# Patient Record
Sex: Male | Born: 1950
Health system: Southern US, Community
[De-identification: ages and names within clinical notes are randomized; demographics above are authoritative.]

## PROBLEM LIST (undated history)

## (undated) DIAGNOSIS — K4090 Unilateral inguinal hernia, without obstruction or gangrene, not specified as recurrent: Secondary | ICD-10-CM

## (undated) DIAGNOSIS — H269 Unspecified cataract: Secondary | ICD-10-CM

## (undated) DIAGNOSIS — I10 Essential (primary) hypertension: Secondary | ICD-10-CM

## (undated) DIAGNOSIS — H35039 Hypertensive retinopathy, unspecified eye: Secondary | ICD-10-CM

## (undated) DIAGNOSIS — I2510 Atherosclerotic heart disease of native coronary artery without angina pectoris: Secondary | ICD-10-CM

## (undated) DIAGNOSIS — E11319 Type 2 diabetes mellitus with unspecified diabetic retinopathy without macular edema: Secondary | ICD-10-CM

## (undated) DIAGNOSIS — E119 Type 2 diabetes mellitus without complications: Secondary | ICD-10-CM

## (undated) DIAGNOSIS — E1165 Type 2 diabetes mellitus with hyperglycemia: Secondary | ICD-10-CM

## (undated) HISTORY — DX: Unspecified cataract: H26.9

## (undated) HISTORY — DX: Type 2 diabetes mellitus with unspecified diabetic retinopathy without macular edema: E11.319

## (undated) HISTORY — DX: Hypertensive retinopathy, unspecified eye: H35.039

## (undated) HISTORY — PX: WISDOM TOOTH EXTRACTION: SHX21

## (undated) HISTORY — PX: HEMORROIDECTOMY: SUR656

---

## 2005-10-14 ENCOUNTER — Emergency Department (HOSPITAL_COMMUNITY): Admission: EM | Admit: 2005-10-14 | Discharge: 2005-10-14 | Payer: Self-pay | Admitting: Emergency Medicine

## 2018-11-11 ENCOUNTER — Emergency Department (HOSPITAL_BASED_OUTPATIENT_CLINIC_OR_DEPARTMENT_OTHER): Payer: Medicare Other

## 2018-11-11 ENCOUNTER — Encounter (HOSPITAL_BASED_OUTPATIENT_CLINIC_OR_DEPARTMENT_OTHER): Payer: Self-pay | Admitting: Emergency Medicine

## 2018-11-11 ENCOUNTER — Other Ambulatory Visit: Payer: Self-pay

## 2018-11-11 ENCOUNTER — Inpatient Hospital Stay (HOSPITAL_BASED_OUTPATIENT_CLINIC_OR_DEPARTMENT_OTHER)
Admission: EM | Admit: 2018-11-11 | Discharge: 2018-11-17 | DRG: 247 | Disposition: A | Discharge status: Home / Self Care | Payer: Medicare Other | Attending: Internal Medicine | Admitting: Internal Medicine

## 2018-11-11 DIAGNOSIS — E1169 Type 2 diabetes mellitus with other specified complication: Secondary | ICD-10-CM

## 2018-11-11 DIAGNOSIS — I509 Heart failure, unspecified: Secondary | ICD-10-CM | POA: Diagnosis not present

## 2018-11-11 DIAGNOSIS — I447 Left bundle-branch block, unspecified: Secondary | ICD-10-CM | POA: Diagnosis present

## 2018-11-11 DIAGNOSIS — I472 Ventricular tachycardia: Secondary | ICD-10-CM | POA: Diagnosis not present

## 2018-11-11 DIAGNOSIS — I10 Essential (primary) hypertension: Secondary | ICD-10-CM | POA: Diagnosis not present

## 2018-11-11 DIAGNOSIS — I160 Hypertensive urgency: Secondary | ICD-10-CM | POA: Diagnosis present

## 2018-11-11 DIAGNOSIS — I2510 Atherosclerotic heart disease of native coronary artery without angina pectoris: Secondary | ICD-10-CM | POA: Diagnosis present

## 2018-11-11 DIAGNOSIS — R945 Abnormal results of liver function studies: Secondary | ICD-10-CM

## 2018-11-11 DIAGNOSIS — I110 Hypertensive heart disease with heart failure: Principal | ICD-10-CM | POA: Diagnosis present

## 2018-11-11 DIAGNOSIS — Z833 Family history of diabetes mellitus: Secondary | ICD-10-CM | POA: Diagnosis not present

## 2018-11-11 DIAGNOSIS — E876 Hypokalemia: Secondary | ICD-10-CM | POA: Diagnosis present

## 2018-11-11 DIAGNOSIS — Z955 Presence of coronary angioplasty implant and graft: Secondary | ICD-10-CM | POA: Diagnosis not present

## 2018-11-11 DIAGNOSIS — K219 Gastro-esophageal reflux disease without esophagitis: Secondary | ICD-10-CM | POA: Diagnosis present

## 2018-11-11 DIAGNOSIS — R7989 Other specified abnormal findings of blood chemistry: Secondary | ICD-10-CM | POA: Diagnosis present

## 2018-11-11 DIAGNOSIS — IMO0002 Reserved for concepts with insufficient information to code with codable children: Secondary | ICD-10-CM

## 2018-11-11 DIAGNOSIS — I5041 Acute combined systolic (congestive) and diastolic (congestive) heart failure: Secondary | ICD-10-CM | POA: Diagnosis present

## 2018-11-11 DIAGNOSIS — E1165 Type 2 diabetes mellitus with hyperglycemia: Secondary | ICD-10-CM | POA: Diagnosis present

## 2018-11-11 DIAGNOSIS — E1122 Type 2 diabetes mellitus with diabetic chronic kidney disease: Secondary | ICD-10-CM | POA: Diagnosis present

## 2018-11-11 DIAGNOSIS — K4090 Unilateral inguinal hernia, without obstruction or gangrene, not specified as recurrent: Secondary | ICD-10-CM | POA: Diagnosis present

## 2018-11-11 DIAGNOSIS — I1A0 Resistant hypertension: Secondary | ICD-10-CM | POA: Diagnosis present

## 2018-11-11 DIAGNOSIS — K920 Hematemesis: Secondary | ICD-10-CM | POA: Diagnosis present

## 2018-11-11 DIAGNOSIS — R17 Unspecified jaundice: Secondary | ICD-10-CM | POA: Diagnosis present

## 2018-11-11 DIAGNOSIS — Z79899 Other long term (current) drug therapy: Secondary | ICD-10-CM

## 2018-11-11 DIAGNOSIS — I5042 Chronic combined systolic (congestive) and diastolic (congestive) heart failure: Secondary | ICD-10-CM | POA: Insufficient documentation

## 2018-11-11 DIAGNOSIS — Z8249 Family history of ischemic heart disease and other diseases of the circulatory system: Secondary | ICD-10-CM

## 2018-11-11 DIAGNOSIS — E7800 Pure hypercholesterolemia, unspecified: Secondary | ICD-10-CM | POA: Diagnosis not present

## 2018-11-11 HISTORY — DX: Type 2 diabetes mellitus with hyperglycemia: E11.65

## 2018-11-11 HISTORY — DX: Atherosclerotic heart disease of native coronary artery without angina pectoris: I25.10

## 2018-11-11 HISTORY — DX: Essential (primary) hypertension: I10

## 2018-11-11 HISTORY — DX: Unilateral inguinal hernia, without obstruction or gangrene, not specified as recurrent: K40.90

## 2018-11-11 HISTORY — DX: Resistant hypertension: I1A.0

## 2018-11-11 HISTORY — DX: Type 2 diabetes mellitus without complications: E11.9

## 2018-11-11 HISTORY — DX: Reserved for concepts with insufficient information to code with codable children: IMO0002

## 2018-11-11 LAB — CBC WITH DIFFERENTIAL/PLATELET
ABS IMMATURE GRANULOCYTES: 0.02 10*3/uL (ref 0.00–0.07)
BASOS PCT: 0 %
Basophils Absolute: 0 10*3/uL (ref 0.0–0.1)
Eosinophils Absolute: 0.1 10*3/uL (ref 0.0–0.5)
Eosinophils Relative: 1 %
HCT: 40 % (ref 39.0–52.0)
HEMOGLOBIN: 13.3 g/dL (ref 13.0–17.0)
IMMATURE GRANULOCYTES: 0 %
LYMPHS PCT: 19 %
Lymphs Abs: 1 10*3/uL (ref 0.7–4.0)
MCH: 30.6 pg (ref 26.0–34.0)
MCHC: 33.3 g/dL (ref 30.0–36.0)
MCV: 92 fL (ref 80.0–100.0)
MONO ABS: 0.4 10*3/uL (ref 0.1–1.0)
MONOS PCT: 7 %
NEUTROS ABS: 3.7 10*3/uL (ref 1.7–7.7)
NEUTROS PCT: 73 %
PLATELETS: 168 10*3/uL (ref 150–400)
RBC: 4.35 MIL/uL (ref 4.22–5.81)
RDW: 12.1 % (ref 11.5–15.5)
WBC: 5.1 10*3/uL (ref 4.0–10.5)
nRBC: 0 % (ref 0.0–0.2)

## 2018-11-11 LAB — COMPREHENSIVE METABOLIC PANEL
ALBUMIN: 3.1 g/dL — AB (ref 3.5–5.0)
ALK PHOS: 158 U/L — AB (ref 38–126)
ALT: 47 U/L — AB (ref 0–44)
AST: 28 U/L (ref 15–41)
Anion gap: 5 (ref 5–15)
BILIRUBIN TOTAL: 1.3 mg/dL — AB (ref 0.3–1.2)
BUN: 14 mg/dL (ref 8–23)
CALCIUM: 8.5 mg/dL — AB (ref 8.9–10.3)
CO2: 29 mmol/L (ref 22–32)
CREATININE: 0.7 mg/dL (ref 0.61–1.24)
Chloride: 101 mmol/L (ref 98–111)
GFR calc Af Amer: >60 mL/min (ref >60)
GLUCOSE: 423 mg/dL — AB (ref 70–99)
Potassium: 3.8 mmol/L (ref 3.5–5.1)
Sodium: 135 mmol/L (ref 135–145)
TOTAL PROTEIN: 7 g/dL (ref 6.5–8.1)

## 2018-11-11 LAB — GLUCOSE, CAPILLARY: Glucose-Capillary: 361 mg/dL — ABNORMAL HIGH (ref 70–99)

## 2018-11-11 LAB — URINALYSIS, MICROSCOPIC (REFLEX)

## 2018-11-11 LAB — URINALYSIS, ROUTINE W REFLEX MICROSCOPIC
BILIRUBIN URINE: NEGATIVE
Glucose, UA: >=500 mg/dL — AB
KETONES UR: NEGATIVE mg/dL
LEUKOCYTE UA: NEGATIVE
Nitrite: NEGATIVE
Protein, ur: 30 mg/dL — AB
SPECIFIC GRAVITY, URINE: 1.01 (ref 1.005–1.030)
pH: 7 (ref 5.0–8.0)

## 2018-11-11 LAB — TROPONIN I: Troponin I: <0.03 ng/mL (ref <0.03)

## 2018-11-11 LAB — LIPASE, BLOOD: LIPASE: 25 U/L (ref 11–51)

## 2018-11-11 LAB — MAGNESIUM: MAGNESIUM: 1.5 mg/dL — AB (ref 1.7–2.4)

## 2018-11-11 LAB — BRAIN NATRIURETIC PEPTIDE: B Natriuretic Peptide: 746.4 pg/mL — ABNORMAL HIGH (ref 0.0–100.0)

## 2018-11-11 LAB — PHOSPHORUS: PHOSPHORUS: 2.7 mg/dL (ref 2.5–4.6)

## 2018-11-11 LAB — PROTIME-INR
INR: 1.2 (ref 0.8–1.2)
PROTHROMBIN TIME: 14.7 s (ref 11.4–15.2)

## 2018-11-11 LAB — TSH: TSH: 2.755 u[IU]/mL (ref 0.350–4.500)

## 2018-11-11 LAB — LACTIC ACID, PLASMA: Lactic Acid, Venous: 1.3 mmol/L (ref 0.5–1.9)

## 2018-11-11 LAB — CBG MONITORING, ED: Glucose-Capillary: 379 mg/dL — ABNORMAL HIGH (ref 70–99)

## 2018-11-11 LAB — HEMOGLOBIN A1C
Hgb A1c MFr Bld: 10.9 % — ABNORMAL HIGH (ref 4.8–5.6)
Mean Plasma Glucose: 266.13 mg/dL

## 2018-11-11 MED ORDER — ACETAMINOPHEN 650 MG RE SUPP: 650.0000 mg | Freq: Four times a day (QID) | RECTAL | Status: DC | PRN

## 2018-11-11 MED ORDER — NITROGLYCERIN 2 % TD OINT
1.0000 [in_us] | TOPICAL_OINTMENT | Freq: Four times a day (QID) | TRANSDERMAL | Status: DC
  Administered 2018-11-11 – 2018-11-17 (×23): 1 [in_us] via TOPICAL
  Filled 2018-11-11: qty 30
  Filled 2018-11-11: qty 1
  Filled 2018-11-11: qty 30

## 2018-11-11 MED ORDER — MAGNESIUM SULFATE 4 GM/100ML IV SOLN
4.0000 g | Freq: Once | INTRAVENOUS | Status: AC
  Administered 2018-11-11: 4 g via INTRAVENOUS
  Filled 2018-11-11: qty 100

## 2018-11-11 MED ORDER — ONDANSETRON HCL 4 MG PO TABS: 4.0000 mg | ORAL_TABLET | Freq: Four times a day (QID) | ORAL | Status: DC | PRN

## 2018-11-11 MED ORDER — ONDANSETRON HCL 4 MG/2ML IJ SOLN: 4.0000 mg | Freq: Four times a day (QID) | INTRAMUSCULAR | Status: DC | PRN

## 2018-11-11 MED ORDER — FUROSEMIDE 10 MG/ML IJ SOLN
40.0000 mg | Freq: Once | INTRAMUSCULAR | Status: AC
  Administered 2018-11-11: 40 mg via INTRAVENOUS
  Filled 2018-11-11: qty 4

## 2018-11-11 MED ORDER — FUROSEMIDE 10 MG/ML IJ SOLN
40.0000 mg | Freq: Two times a day (BID) | INTRAMUSCULAR | Status: DC
  Administered 2018-11-12 – 2018-11-13 (×3): 40 mg via INTRAVENOUS
  Filled 2018-11-11 (×3): qty 4

## 2018-11-11 MED ORDER — ACETAMINOPHEN 325 MG PO TABS: 650.0000 mg | ORAL_TABLET | Freq: Four times a day (QID) | ORAL | Status: DC | PRN

## 2018-11-11 MED ORDER — POTASSIUM CHLORIDE CRYS ER 20 MEQ PO TBCR
20.0000 meq | EXTENDED_RELEASE_TABLET | Freq: Two times a day (BID) | ORAL | Status: DC
  Administered 2018-11-11 – 2018-11-13 (×4): 20 meq via ORAL
  Filled 2018-11-11 (×4): qty 1

## 2018-11-11 MED ORDER — LISINOPRIL 5 MG PO TABS
2.5000 mg | ORAL_TABLET | Freq: Every day | ORAL | Status: DC
  Administered 2018-11-11 – 2018-11-13 (×3): 2.5 mg via ORAL
  Filled 2018-11-11 (×3): qty 1

## 2018-11-11 MED ORDER — INSULIN ASPART 100 UNIT/ML ~~LOC~~ SOLN
0.0000 [IU] | Freq: Three times a day (TID) | SUBCUTANEOUS | Status: DC
  Administered 2018-11-12: 11 [IU] via SUBCUTANEOUS
  Administered 2018-11-12: 3 [IU] via SUBCUTANEOUS
  Administered 2018-11-12: 8 [IU] via SUBCUTANEOUS
  Administered 2018-11-13 (×2): 3 [IU] via SUBCUTANEOUS
  Administered 2018-11-13: 2 [IU] via SUBCUTANEOUS
  Administered 2018-11-14: 5 [IU] via SUBCUTANEOUS
  Administered 2018-11-14: 3 [IU] via SUBCUTANEOUS
  Administered 2018-11-14 – 2018-11-15 (×2): 2 [IU] via SUBCUTANEOUS
  Administered 2018-11-16: 06:00:00 5 [IU] via SUBCUTANEOUS

## 2018-11-11 MED ORDER — PANTOPRAZOLE SODIUM 40 MG IV SOLR
40.0000 mg | Freq: Once | INTRAVENOUS | Status: AC
Start: 2018-11-11 — End: 2018-11-11
  Administered 2018-11-11: 40 mg via INTRAVENOUS
  Filled 2018-11-11: qty 40

## 2018-11-11 NOTE — ED Triage Notes (Signed)
Reports vomiting blood x 4 weeks.  Additionally c/o shortness of breath which he believes is related to discontinuing metformin.

## 2018-11-11 NOTE — ED Notes (Signed)
Ambulated in [place] with SpO2 92% on RA. Pt states he feels some better. MD made aware.

## 2018-11-11 NOTE — H&P (Signed)
History and Physical    Connelly [place] DPO:[id number] DOB: [birth year] DOA: 11/11/2018  PCP: System, Pcp Not In   Patient coming from: Home.  I have personally briefly reviewed patient's old medical records in [place]  Chief Complaint: Shortness of breath and swelling.  HPI: Earl Calderon is a 68 y.o. male with medical history significant of type 2 diabetes, hypertension, inguinal hernia who has not seeing a physician for and has been treating himself with herbal medicines for the past 10 years and more recently with metformin who is coming to the emergency department with complaints of hematemesis for the last 4 weeks, associated with progressively worse lower extremity edema and dyspnea.  He also complains of orthopnea and PND.  He denies fever, chills, but complains of fatigue and decreased appetite.  He denies chest pain, palpitations, diaphoresis, abdominal pain, melena or hematochezia.  No dysuria, frequency or hematuria.  He denies polyuria, polydipsia, polyphagia or blurred vision.  ED Course: Initial vital signs temperature 98.1 F, pulse 77, respirations 20, blood pressure 178/105 mmHg and O2 sat 96% on room air.  The patient received supplemental oxygen and 40 mg of furosemide IVP in the ED.  White count 5.1, hemoglobin 13.3 g/dL and platelets 168.  PT/INR were normal.  Initial troponin was normal.  Lipase within expected limits.  CMP shows normal electrolytes when calcium is corrected to albumin.  Renal function is normal.  Total protein was 7.0 and albumin 3.1 g/dL.  AST 28, ALT was 47, alkaline phosphatase 150 a and total bilirubin 1.3 mg/dL.  EKG shows sinus rhythm with probable left atrial enlargement with level and the branch block.  Chest radiograph shows bilateral pleural effusions and likely underlying atelectasis/infiltrate.  Please see images and radiology report for further detail.  Review of Systems: As per HPI otherwise 10 point review of systems negative.   Past Medical History:  Diagnosis Date  . Diabetes mellitus without complication ([place])   . Hernia, inguinal   . Hypertension   . Uncontrolled type 2 diabetes mellitus ([place]) 11/11/2018   Past Surgical History:  Procedure Laterality Date  . HEMORROIDECTOMY      reports that he has never smoked. He has never used smokeless tobacco. He reports that he does not drink alcohol or use drugs.  No Known Allergies  Family History  Problem Relation Age of Onset  . Hypertension Mother   . Diabetes Mellitus II Mother   . Arrhythmia Mother   . Heart disease Mother   . Hypertension Sister   . Hypertension Brother   . Hypertension Other   . Diabetes Mellitus II Other        All 9 sibblings have HTN    Prior to Admission medications   Not on File    Physical Exam: Vitals:   11/11/18 1940 11/11/18 2000 11/11/18 2130 11/11/18 2134  BP: (!) 177/107 (!) 176/110  (!) 183/100  Pulse: 70 71  (!) 49  Resp: 17 19  16   Temp:    97.8 F (36.6 C)  TempSrc:      SpO2: 98% 97%  99%  Weight:   72.9 kg   Height:   5\' 10"  (1.778 m)     Constitutional: NAD, calm, comfortable Eyes: PERRL, lids and conjunctivae normal ENMT: Mucous membranes are moist. Posterior pharynx clear of any exudate or lesions. Neck: normal, supple, no masses, no thyromegaly Respiratory: bibasilar Rales. Normal respiratory effort. No accessory muscle use.  Cardiovascular: Regular rate and rhythm, no murmurs /  rubs / gallops.  3+ lower extremities pitting edema. 2+ pedal pulses. No carotid bruits.  Abdomen: Soft, mild LLQ tenderness, no guarding or rebound, no masses palpated. No hepatosplenomegaly. Bowel sounds positive.  Musculoskeletal: no clubbing / cyanosis. Good ROM, no contractures. Normal muscle tone.  Skin: no rashes, lesions, ulcers on limited dermatological examination. Neurologic: CN 2-12 grossly intact. Sensation intact, DTR normal. Strength 5/5 in all 4.  Psychiatric: Normal judgment and insight. Alert and  oriented x 3. Normal mood.   Labs on Admission: I have personally reviewed following labs and imaging studies  CBC: Recent Labs  Lab 11/11/18 1515  WBC 5.1  NEUTROABS 3.7  HGB 13.3  HCT 40.0  MCV 92.0  PLT 810   Basic Metabolic Panel: Recent Labs  Lab 11/11/18 1515  NA 135  K 3.8  CL 101  CO2 29  GLUCOSE 423*  BUN 14  CREATININE 0.70  CALCIUM 8.5*  MG 1.5*  PHOS 2.7   GFR: Estimated Creatinine Clearance: 94.9 mL/min (by C-G formula based on SCr of 0.7 mg/dL). Liver Function Tests: Recent Labs  Lab 11/11/18 1515  AST 28  ALT 47*  ALKPHOS 158*  BILITOT 1.3*  PROT 7.0  ALBUMIN 3.1*   Recent Labs  Lab 11/11/18 1515  LIPASE 25   No results for input(s): AMMONIA in the last 168 hours. Coagulation Profile: Recent Labs  Lab 11/11/18 1515  INR 1.2   Cardiac Enzymes: Recent Labs  Lab 11/11/18 1515  TROPONINI <0.03   BNP (last 3 results) No results for input(s): PROBNP in the last 8760 hours. HbA1C: Recent Labs    11/11/18 1515  HGBA1C 10.9*   CBG: Recent Labs  Lab 11/11/18 1432  GLUCAP 379*   Lipid Profile: No results for input(s): CHOL, HDL, LDLCALC, TRIG, CHOLHDL, LDLDIRECT in the last 72 hours. Thyroid Function Tests: Recent Labs    11/11/18 1515  TSH 2.755   Anemia Panel: No results for input(s): VITAMINB12, FOLATE, FERRITIN, TIBC, IRON, RETICCTPCT in the last 72 hours. Urine analysis:    Component Value Date/Time   COLORURINE YELLOW 11/11/2018 1515   APPEARANCEUR CLEAR 11/11/2018 1515   LABSPEC 1.010 11/11/2018 1515   PHURINE 7.0 11/11/2018 1515   GLUCOSEU >=500 (A) 11/11/2018 1515   HGBUR SMALL (A) 11/11/2018 1515   BILIRUBINUR NEGATIVE 11/11/2018 [place] 11/11/2018 1515   PROTEINUR 30 (A) 11/11/2018 1515   NITRITE NEGATIVE 11/11/2018 1515   LEUKOCYTESUR NEGATIVE 11/11/2018 1515    Radiological Exams on Admission: Dg Chest 2 View  Result Date: 11/11/2018 CLINICAL DATA:  Vomiting but for several weeks  with shortness of breath EXAM: CHEST - 2 VIEW COMPARISON:  10/14/2005 FINDINGS: Cardiac shadow is enlarged. Bilateral pleural effusions and underlying atelectatic changes are noted. No acute bony abnormality is seen. IMPRESSION: Bilateral pleural effusions and likely underlying atelectasis/infiltrate. Electronically Signed   By: Inez Catalina M.D.   On: 11/11/2018 16:21    EKG: Independently reviewed. Vent. rate 74 BPM PR interval * ms QRS duration 151 ms QT/QTc 470/522 ms P-R-T axes 71 76 -81 Sinus rhythm Probable left atrial enlargement Left bundle branch  Assessment/Plan Principal Problem:   Acute CHF (congestive heart failure) (HCC) Observation/telemetry. Continue supplemental oxygen. Fluid restriction. Monitor daily weights, intake and output. Continue furosemide 40 mg IVP twice daily. Start lisinopril 2.5 mg p.o. daily. Check echocardiogram in a.m. Consult cardiology for initial CHF evaluation.  Active Problems:   Hypertension Currently getting furosemide 40 mg IVP twice daily. Started on lisinopril  2.5 mg p.o. daily. May be started on low-dose beta-blocker after acute CHF compensation. Monitor BP, renal function and electrolytes.    Uncontrolled type 2 diabetes mellitus (HCC) Carbohydrate modified diet. Check hemoglobin A1c. CBG monitoring regular insulin sliding scale while in the hospital.    Hypomagnesemia Replacing. Follow-up magnesium level as needed.    Hyperbilirubinemia Cardia cirrhosis? Check RUQ ultrasound.    Abnormal LFTs Repeat LFTs in a.m. Check RUQ ultrasound.    Hematemesis No melena or hematochezia. The patient has a normal hemoglobin. Origin no hematemesis?. Check stool occult blood. Consider outpatient GI evaluation.   DVT prophylaxis: Lovenox SQ. Code Status: Full code. Family Communication: Disposition Plan: Observation for 24 to 48 hours for CHF initial evaluation and treatment. Consults called: Left message with cardiology  Master for a.m. consult. Admission status: Observation/telemetry.   Reubin Milan MD [facility]  11/11/2018, 9:57 PM   This document was prepared using Dragon voice recognition software and may contain some unintended transcription errors.

## 2018-11-11 NOTE — ED Provider Notes (Addendum)
[facility] EMERGENCY DEPARTMENT Provider Note   CSN: [id number] Arrival date & time: 11/11/18  1424    History   Chief Complaint Chief Complaint  Patient presents with  . Hematemesis    HPI Earl Calderon is a 69 y.o. male.     HPI Patient has been opting to treat his known diabetes and hypertension through natural and herbal means for over 10 years.  He reports he probably has not been seen for a physician in 10 years.  He has been trying to use good diet control and herbal supplements.  He reports for quite a while he was doing really well with that therapy regimen.  He reports now though over the past weeks to months he is started to get a lot of swelling in his legs.  He reports he is swollen all the way up to his genitals.  He states that he is very short of breath if he lies flat.  He reports he has had a dry cough.  He reports he has to strain at stool and sometimes if he is doing that, vomits up a small amount of blood.  He reports that might happen a couple times a week.  Patient reports that some days now he is so fatigued that is all he can do to get out of bed.  He reports also he has had some penile drainage.  He reports that he has concern for a distant STD exposure.  He reports his wife and he are not sexually active together.  He reports just this last week as he started to get the swelling in his legs and abdomen he has gotten swelling of his penile fluid and cannot retract the foreskin.  He reports there is been some white leakage.  Patient denies any smoking alcohol or drug history. Past Medical History:  Diagnosis Date  . Diabetes mellitus without complication ([place])   . Hernia, inguinal   . Hypertension     There are no active problems to display for this patient.   History reviewed. No pertinent surgical history.      Home Medications    Prior to Admission medications   Not on File    Family History History reviewed. No pertinent family  history.  Social History Social History   Tobacco Use  . Smoking status: Never Smoker  . Smokeless tobacco: Never Used  Substance Use Topics  . Alcohol use: Never    Frequency: Never  . Drug use: Never     Allergies   Patient has no known allergies.   Review of Systems Review of Systems 10 Systems reviewed and are negative for acute change except as noted in the HPI.  Physical Exam Updated Vital Signs BP (!) 189/121 (BP Location: Right Arm)   Pulse 77   Temp 98.1 F (36.7 C) (Oral)   Resp 16   Ht 5\' 10"  (1.778 m)   Wt 72.6 kg   SpO2 96%   BMI 22.96 kg/m   Physical Exam Constitutional:      Comments: Patient is alert and nontoxic.  No respiratory distress sitting up.  When patient was examined supine he became short of breath and desaturated to the low 80s.  Mental status is clear.  HENT:     Head: Normocephalic and atraumatic.     Mouth/Throat:     Mouth: Mucous membranes are moist.     Pharynx: Oropharynx is clear.  Eyes:     Extraocular Movements: Extraocular movements  intact.  Neck:     Musculoskeletal: Neck supple.  Cardiovascular:     Comments: Regular rate rhythm no rub murmur gallop. Pulmonary:     Comments: Paroxysmal dry cough intermittently.  Lungs are grossly clear but patient has very diminished breath sounds from mid lung fields to lower lung fields.  Patient appears to have positive hepatojugular reflux. Abdominal:     General: There is no distension.     Palpations: Abdomen is soft.     Tenderness: There is no abdominal tenderness. There is no guarding.     Comments: Palpable hepatic edge.  Nontender.  Genitourinary:    Comments: Penis is not significantly edematous but the penile foot is mildly swollen not erythematous.  Cannot easily be retracted.  No apparent drainage but a small amount of clear urine appears present.  The scrotum is soft and malleable.  Mild enlargement. Musculoskeletal:     Comments: 2+ pitting edema bilateral lower  extremities.  Skin thinning and hair loss of lower legs.  Edema up to the top of her thighs.  Skin:    General: Skin is warm and dry.  Neurological:     General: No focal deficit present.     Mental Status: He is oriented to person, place, and time.     Coordination: Coordination normal.  Psychiatric:        Mood and Affect: Mood normal.      ED Treatments / Results  Labs (all labs ordered are listed, but only abnormal results are displayed) Labs Reviewed  COMPREHENSIVE METABOLIC PANEL - Abnormal; Notable for the following components:      Result Value   Glucose, Bld 423 (*)    Calcium 8.5 (*)    Albumin 3.1 (*)    ALT 47 (*)    Alkaline Phosphatase 158 (*)    Total Bilirubin 1.3 (*)    All other components within normal limits  BRAIN NATRIURETIC PEPTIDE - Abnormal; Notable for the following components:   B Natriuretic Peptide 746.4 (*)    All other components within normal limits  URINALYSIS, ROUTINE W REFLEX MICROSCOPIC - Abnormal; Notable for the following components:   Glucose, UA >=500 (*)    Hgb urine dipstick SMALL (*)    Protein, ur 30 (*)    All other components within normal limits  MAGNESIUM - Abnormal; Notable for the following components:   Magnesium 1.5 (*)    All other components within normal limits  URINALYSIS, MICROSCOPIC (REFLEX) - Abnormal; Notable for the following components:   Bacteria, UA RARE (*)    All other components within normal limits  CBG MONITORING, ED - Abnormal; Notable for the following components:   Glucose-Capillary 379 (*)    All other components within normal limits  URINE CULTURE  LIPASE, BLOOD  TROPONIN I  LACTIC ACID, PLASMA  CBC WITH DIFFERENTIAL/PLATELET  PROTIME-INR  PHOSPHORUS  LACTIC ACID, PLASMA  RPR  HIV ANTIBODY (ROUTINE TESTING W REFLEX)  TSH  HEMOGLOBIN A1C  GC/CHLAMYDIA PROBE AMP ([place]) NOT AT [facility]    EKG EKG Interpretation  Date/Time:  Thursday November 11 2018 15:01:17 EDT Ventricular Rate:  74  PR Interval:    QRS Duration: 151 QT Interval:  470 QTC Calculation: 522 R Axis:   76 Text Interpretation:  Sinus rhythm Probable left atrial enlargement Left bundle branch block agree. no old Confirmed by Charlesetta Shanks [phone]) on 11/11/2018 3:37:17 PM   Radiology Dg Chest 2 View  Result Date: 11/11/2018 CLINICAL DATA:  Vomiting but for several weeks with shortness of breath EXAM: CHEST - 2 VIEW COMPARISON:  10/14/2005 FINDINGS: Cardiac shadow is enlarged. Bilateral pleural effusions and underlying atelectatic changes are noted. No acute bony abnormality is seen. IMPRESSION: Bilateral pleural effusions and likely underlying atelectasis/infiltrate. Electronically Signed   By: Inez Catalina M.D.   On: 11/11/2018 16:21    Procedures Procedures (including critical care time)  Medications Ordered in ED Medications  furosemide (LASIX) injection 40 mg (has no administration in time range)  pantoprazole (PROTONIX) injection 40 mg (has no administration in time range)  nitroGLYCERIN (NITROGLYN) 2 % ointment 1 inch (has no administration in time range)     Initial Impression / Assessment and Plan / ED Course  I have reviewed the triage vital signs and the nursing notes.  Pertinent labs & imaging results that were available during my care of the patient were reviewed by me and considered in my medical decision making (see chart for details).  Clinical Course as of Nov 10 1744  Thu Nov 11, 2018  1745 Consult: Dr. Wyline Copas tried hospitalist for admission.   [MP]    Clinical Course User Index [MP] Charlesetta Shanks, MD      Patient became quite short of breath with oxygen desaturation in supine position.  He has been developing significant lower extremity swelling over a number of at least 2 months.  Unfortunately, patient has been opting to treat diabetes and hypertension through exclusively dietary and exercise measures for 10 years.  He has not been getting any surveillance with known  hypertension and diabetes.  Today he appears to have congestive heart failure with pleural effusions and orthopnea and edema.  His mental status is clear.  Blood sugar is elevated but no signs of hyperosmolar condition.  Plan for admission for likely congestive heart failure as complication of untreated hypertension.  Will initiate Lasix and nitroglycerin paste.  Patient will need further diagnostic evaluation and medical management.  Final Clinical Impressions(s) / ED Diagnoses   Final diagnoses:  Acute congestive heart failure, unspecified heart failure type ([place])  Hypertensive urgency  Type 2 diabetes mellitus with other specified complication, without long-term current use of insulin [facility])    ED Discharge Orders    None       Charlesetta Shanks, MD 11/11/18 1730    Charlesetta Shanks, MD 11/11/18 1746

## 2018-11-11 NOTE — ED Notes (Signed)
Blood work and urine to lab

## 2018-11-12 ENCOUNTER — Other Ambulatory Visit: Payer: Self-pay

## 2018-11-12 ENCOUNTER — Inpatient Hospital Stay (HOSPITAL_COMMUNITY): Payer: Medicare Other

## 2018-11-12 DIAGNOSIS — R945 Abnormal results of liver function studies: Secondary | ICD-10-CM

## 2018-11-12 DIAGNOSIS — E1169 Type 2 diabetes mellitus with other specified complication: Secondary | ICD-10-CM

## 2018-11-12 DIAGNOSIS — I5041 Acute combined systolic (congestive) and diastolic (congestive) heart failure: Secondary | ICD-10-CM

## 2018-11-12 LAB — GC/CHLAMYDIA PROBE AMP (~~LOC~~) NOT AT ARMC
CHLAMYDIA, DNA PROBE: NEGATIVE
Neisseria Gonorrhea: NEGATIVE

## 2018-11-12 LAB — LACTIC ACID, PLASMA: Lactic Acid, Venous: 1 mmol/L (ref 0.5–1.9)

## 2018-11-12 LAB — OCCULT BLOOD X 1 CARD TO LAB, STOOL: Fecal Occult Bld: NEGATIVE

## 2018-11-12 LAB — COMPREHENSIVE METABOLIC PANEL
ALK PHOS: 163 U/L — AB (ref 38–126)
ALT: 41 U/L (ref 0–44)
ANION GAP: 6 (ref 5–15)
AST: 25 U/L (ref 15–41)
Albumin: 2.9 g/dL — ABNORMAL LOW (ref 3.5–5.0)
BUN: 16 mg/dL (ref 8–23)
CALCIUM: 8.5 mg/dL — AB (ref 8.9–10.3)
CO2: 34 mmol/L — ABNORMAL HIGH (ref 22–32)
Chloride: 98 mmol/L (ref 98–111)
Creatinine, Ser: 0.81 mg/dL (ref 0.61–1.24)
GFR calc Af Amer: >60 mL/min (ref >60)
GFR calc non Af Amer: >60 mL/min (ref >60)
Glucose, Bld: 407 mg/dL — ABNORMAL HIGH (ref 70–99)
Potassium: 3.5 mmol/L (ref 3.5–5.1)
SODIUM: 138 mmol/L (ref 135–145)
Total Bilirubin: 1.1 mg/dL (ref 0.3–1.2)
Total Protein: 6.7 g/dL (ref 6.5–8.1)

## 2018-11-12 LAB — CBC WITH DIFFERENTIAL/PLATELET
Abs Immature Granulocytes: 0.01 10*3/uL (ref 0.00–0.07)
Basophils Absolute: 0 10*3/uL (ref 0.0–0.1)
Basophils Relative: 0 %
Eosinophils Absolute: 0.1 10*3/uL (ref 0.0–0.5)
Eosinophils Relative: 1 %
HCT: 40.9 % (ref 39.0–52.0)
Hemoglobin: 13 g/dL (ref 13.0–17.0)
Immature Granulocytes: 0 %
LYMPHS PCT: 18 %
Lymphs Abs: 0.9 10*3/uL (ref 0.7–4.0)
MCH: 30 pg (ref 26.0–34.0)
MCHC: 31.8 g/dL (ref 30.0–36.0)
MCV: 94.5 fL (ref 80.0–100.0)
Monocytes Absolute: 0.5 10*3/uL (ref 0.1–1.0)
Monocytes Relative: 10 %
Neutro Abs: 3.6 10*3/uL (ref 1.7–7.7)
Neutrophils Relative %: 71 %
Platelets: 189 10*3/uL (ref 150–400)
RBC: 4.33 MIL/uL (ref 4.22–5.81)
RDW: 12.2 % (ref 11.5–15.5)
WBC: 5 10*3/uL (ref 4.0–10.5)
nRBC: 0 % (ref 0.0–0.2)

## 2018-11-12 LAB — ECHOCARDIOGRAM COMPLETE
Height: 70 in
Weight: 2505.6 oz

## 2018-11-12 LAB — GLUCOSE, CAPILLARY
Glucose-Capillary: 309 mg/dL — ABNORMAL HIGH (ref 70–99)
Glucose-Capillary: 174 mg/dL — ABNORMAL HIGH (ref 70–99)
Glucose-Capillary: 256 mg/dL — ABNORMAL HIGH (ref 70–99)
Glucose-Capillary: 225 mg/dL — ABNORMAL HIGH (ref 70–99)

## 2018-11-12 LAB — TYPE AND SCREEN
ABO/RH(D): O POS
Antibody Screen: NEGATIVE

## 2018-11-12 LAB — URINE CULTURE: CULTURE: NO GROWTH

## 2018-11-12 LAB — TROPONIN I
Troponin I: 0.03 ng/mL (ref <0.03)
Troponin I: 0.03 ng/mL (ref <0.03)

## 2018-11-12 LAB — ABO/RH: ABO/RH(D): O POS

## 2018-11-12 LAB — RPR: RPR: NONREACTIVE

## 2018-11-12 LAB — HIV ANTIBODY (ROUTINE TESTING W REFLEX): HIV Screen 4th Generation wRfx: NONREACTIVE

## 2018-11-12 MED ORDER — PANTOPRAZOLE SODIUM 40 MG IV SOLR
40.0000 mg | Freq: Two times a day (BID) | INTRAVENOUS | Status: DC
  Administered 2018-11-12 – 2018-11-13 (×3): 40 mg via INTRAVENOUS
  Filled 2018-11-12 (×4): qty 40

## 2018-11-12 MED ORDER — INSULIN GLARGINE 100 UNIT/ML ~~LOC~~ SOLN: 8.0000 [IU] | Freq: Every day | SUBCUTANEOUS | Status: DC

## 2018-11-12 MED ORDER — INSULIN STARTER KIT- PEN NEEDLES (ENGLISH)
1.0000 | Freq: Once | Status: AC
  Administered 2018-11-12: 1

## 2018-11-12 MED ORDER — INSULIN GLARGINE 100 UNIT/ML ~~LOC~~ SOLN
6.0000 [IU] | Freq: Every day | SUBCUTANEOUS | Status: DC
  Administered 2018-11-12: 6 [IU] via SUBCUTANEOUS
  Filled 2018-11-12 (×2): qty 0.06

## 2018-11-12 MED ORDER — LIVING WELL WITH DIABETES BOOK
Freq: Once | Status: AC
  Administered 2018-11-12: 23:00:00

## 2018-11-12 MED ORDER — HYDRALAZINE HCL 20 MG/ML IJ SOLN
10.0000 mg | INTRAMUSCULAR | Status: DC | PRN
  Administered 2018-11-12 – 2018-11-13 (×2): 10 mg via INTRAVENOUS
  Filled 2018-11-12 (×2): qty 1

## 2018-11-12 NOTE — Progress Notes (Signed)
Inpatient Diabetes Program Recommendations  AACE/ADA: New Consensus Statement on Inpatient Glycemic Control (2015)  Target Ranges:  Prepandial:   less than 140 mg/dL      Peak postprandial:   less than 180 mg/dL (1-2 hours)      Critically ill patients:  140 - 180 mg/dL   Lab Results  Component Value Date   GLUCAP 174 (H) 11/12/2018   HGBA1C 10.9 (H) 11/11/2018    Review of Glycemic Control  Diabetes history: DM2 Outpatient Diabetes medications: None Current orders for Inpatient glycemic control: Lantus 6 units QD, Novolog 0-15 units tidwc  HgbA1C - 10.9% - uncontrolled Ordered Living Well with Diabetes book and Insulin pen starter kit  Inpatient Diabetes Program Recommendations:     Increase Lantus to 10 units QD If post-prandial blood sugars > 180 mg/dL, add Novolog 3 units tidwc for meal coverage insulin.  Spoke to pt about HgbA1C of 10.9%. Discussed importance of controlling blood sugars to reduce risk of long-term complications. Pt states he uses herbals, but blood sugars have been elevated lately. Pt interested in insulin pen rather than vial and syringe. Demonstrated insulin pen and bedside RN to f/u with instruction. Ordered insulin pen starter kit and Living Well with Diabetes book. Discussed how diet, exercise, meds, stress, and sickness can affect blood sugar control. Pt seems interested in controlling blood sugars.  Will continue to follow closely.   Thank you. Lorenda Peck, RD, LDN, CDE Inpatient Diabetes Coordinator [phone]

## 2018-11-12 NOTE — Progress Notes (Signed)
PROGRESS NOTE                                                                                                                                                                                                             Patient Demographics:    Earl Calderon, is a 68 y.o. male, DOB - 1950/12/31, BEM:[id number]  Admit date - 11/11/2018   Admitting Physician Donne Hazel, MD  Outpatient Primary MD for the patient is System, Pcp Not In  LOS - 1   Chief Complaint  Patient presents with  . Hematemesis       Brief Narrative    68 y.o. male with medical history significant of type 2 diabetes, hypertension, inguinal hernia who has not seeing a physician for and has been treating himself with herbal medicines for the past 10 years and more recently with metformin who is coming to the emergency department with complaints of hematemesis for the last 4 weeks, associated with progressively worse lower extremity edema and dyspnea.  His work-up significant for volume overload, uncontrolled blood pressure and hyperglycemia s Collyn Ribas today reports generalized weakness, some dyspnea, no chest pain, no fever or chills .    Assessment  & Plan :    Principal Problem:   Acute CHF (congestive heart failure) (HCC) Active Problems:   Hypertension   Uncontrolled type 2 diabetes mellitus (HCC)   Hypomagnesemia   Hyperbilirubinemia   Abnormal LFTs   Acute CHF (congestive heart failure) (HCC) -Pending to the echo to specify type, but he does appear to be volume overloaded, with +3 edema, elevated proBNP, volume overload on imaging, continue with Lasix 40 mg IV twice daily, continue with daily weight, low-salt diet, control his hypertension. -remians on 2 L nasal cannula  Hypertension -Uncontrolled on presentation, but appears to be better controlled after starting low-dose lisinopril and IV Lasix, adjust medication as needed  Uncontrolled type 2  diabetes mellitus ([place]) -Patient reports he is aware of his diabetes mellitus diagnosis, but reports he was trying and is with diet, A1c came back elevated at 10.9, cussed with him, will start on insulin sliding scale, started on low-dose Lantus, will start on metformin as well.    Hypomagnesemia Replacing. Follow-up magnesium level as needed.    Hyperbilirubinemia Cardia cirrhosis? Check RUQ ultrasound.    Abnormal LFTs Repeat LFTs in  a.m. work-up significant for hemangioma, will need repeat ultrasound in 6 months     Hematemesis No melena or hematochezia. The patient has a normal hemoglobin. Origin no hematemesis?. Check stool occult blood. Consider outpatient GI evaluation.     Code Status : Full  Family Communication  : none at bedside  Disposition Plan  : Home  Barriers For Discharge : Remains on IV diuresis, pending further work-up for CHF and hyperglycemia  Consults  : None  Procedures  : None  DVT Prophylaxis  :  SCD  Lab Results  Component Value Date   PLT 189 11/12/2018    Antibiotics  :    Anti-infectives (From admission, onward)   None        Objective:   Vitals:   11/12/18 0035 11/12/18 0350 11/12/18 0500 11/12/18 1309  BP: (!) 175/101 (!) 158/93  140/75  Pulse: 75   70  Resp: 16     Temp: 98 F (36.7 C)   98.7 F (37.1 C)  TempSrc: Oral   Oral  SpO2: 100%   91%  Weight:   71 kg   Height:        Wt Readings from Last 3 Encounters:  11/12/18 71 kg     Intake/Output Summary (Last 24 hours) at 11/12/2018 1534 Last data filed at 11/12/2018 0900 Gross per 24 hour  Intake 340 ml  Output 2750 ml  Net -2410 ml     Physical Exam  Awake Alert, Oriented X 3, No new F.N deficits, Normal affect Symmetrical Chest wall movement, air entry at the bases with some crackles RRR,No Gallops,Rubs or new Murmurs, No Parasternal Heave +ve B.Sounds, Abd Soft, No tenderness, No organomegaly appriciated, No rebound - guarding or rigidity. No  Cyanosis, Clubbing, has +3 edema    Data Review:    CBC Recent Labs  Lab 11/11/18 1515 11/12/18 0041  WBC 5.1 5.0  HGB 13.3 13.0  HCT 40.0 40.9  PLT 168 189  MCV 92.0 94.5  MCH 30.6 30.0  MCHC 33.3 31.8  RDW 12.1 12.2  LYMPHSABS 1.0 0.9  MONOABS 0.4 0.5  EOSABS 0.1 0.1  BASOSABS 0.0 0.0    Chemistries  Recent Labs  Lab 11/11/18 1515 11/12/18 0041  NA 135 138  K 3.8 3.5  CL 101 98  CO2 29 34*  GLUCOSE 423* 407*  BUN 14 16  CREATININE 0.70 0.81  CALCIUM 8.5* 8.5*  MG 1.5*  --   AST 28 25  ALT 47* 41  ALKPHOS 158* 163*  BILITOT 1.3* 1.1   ------------------------------------------------------------------------------------------------------------------ No results for input(s): CHOL, HDL, LDLCALC, TRIG, CHOLHDL, LDLDIRECT in the last 72 hours.  Lab Results  Component Value Date   HGBA1C 10.9 (H) 11/11/2018   ------------------------------------------------------------------------------------------------------------------ Recent Labs    11/11/18 1515  TSH 2.755   ------------------------------------------------------------------------------------------------------------------ No results for input(s): VITAMINB12, FOLATE, FERRITIN, TIBC, IRON, RETICCTPCT in the last 72 hours.  Coagulation profile Recent Labs  Lab 11/11/18 1515  INR 1.2    No results for input(s): DDIMER in the last 72 hours.  Cardiac Enzymes Recent Labs  Lab 11/11/18 2350 11/12/18 0048 11/12/18 0547  TROPONINI <0.03 0.03* 0.03*   ------------------------------------------------------------------------------------------------------------------    Component Value Date/Time   BNP 746.4 (H) 11/11/2018 1515    Inpatient Medications  Scheduled Meds: . furosemide  40 mg Intravenous BID  . insulin aspart  0-15 Units Subcutaneous TID WC  . insulin glargine  6 Units Subcutaneous Daily  . lisinopril  2.5 mg Oral  Daily  . nitroGLYCERIN  1 inch Topical Q6H  . pantoprazole  (PROTONIX) IV  40 mg Intravenous Q12H  . potassium chloride  20 mEq Oral BID   Continuous Infusions: PRN Meds:.acetaminophen **OR** acetaminophen, hydrALAZINE, ondansetron **OR** ondansetron (ZOFRAN) IV  Micro Results Recent Results (from the past 240 hour(s))  Urine culture     Status: None   Collection Time: 11/11/18  3:15 PM  Result Value Ref Range Status   Specimen Description   Final    URINE, CLEAN CATCH Performed at [facility], [address] [place], [place] [address]    Special Requests   Final    NONE Performed at [facility], [address] [place], [place] [address]    Culture   Final    NO GROWTH Performed at [facility], [place] [address] [place], [place] [address]    Report Status 11/12/2018 FINAL  Final    Radiology Reports Dg Chest 2 View  Result Date: 11/11/2018 CLINICAL DATA:  Vomiting but for several weeks with shortness of breath EXAM: CHEST - 2 VIEW COMPARISON:  10/14/2005 FINDINGS: Cardiac shadow is enlarged. Bilateral pleural effusions and underlying atelectatic changes are noted. No acute bony abnormality is seen. IMPRESSION: Bilateral pleural effusions and likely underlying atelectasis/infiltrate. Electronically Signed   By: Inez Catalina M.D.   On: 11/11/2018 16:21   US Abdomen Limited Ruq  Result Date: 11/12/2018 CLINICAL DATA:  Abnormal liver function tests EXAM: ULTRASOUND ABDOMEN LIMITED RIGHT UPPER QUADRANT COMPARISON:  None. FINDINGS: Gallbladder: No gallstones or wall thickening visualized. No sonographic Murphy sign noted by sonographer. Common bile duct: Diameter: 3 mm. Liver: Hypervascular lesion in the left lobe liver measuring up to 2.1 cm. No evidence of underlying cirrhosis or other chronic liver disease. Portal vein is patent on color Doppler imaging with normal direction of blood flow towards the liver. Right pleural effusion. IMPRESSION: 1. Hyperechoic lesion in the left lobe measuring 2.1 cm, usually  attributed to hemangioma although non specific. If chronic liver disease or metastatic disease, recommend MR. Otherwise, recommend 6 month follow up US. 2. Negative gallbladder. 3. Right pleural effusion. Electronically Signed   By: Monte Fantasia M.D.   On: 11/12/2018 08:42     Phillips Climes M.D on 11/12/2018 at 3:34 PM  Between 7am to 7pm - Pager - [phone]  After 7pm go to [email or web address] - password [facility]  [facility] -  Office  [phone]

## 2018-11-12 NOTE — Progress Notes (Signed)
  Echocardiogram 2D Echocardiogram has been performed.  Earl Calderon 11/12/2018, 2:09 PM

## 2018-11-13 DIAGNOSIS — I10 Essential (primary) hypertension: Secondary | ICD-10-CM

## 2018-11-13 DIAGNOSIS — I447 Left bundle-branch block, unspecified: Secondary | ICD-10-CM

## 2018-11-13 DIAGNOSIS — I5041 Acute combined systolic (congestive) and diastolic (congestive) heart failure: Secondary | ICD-10-CM

## 2018-11-13 DIAGNOSIS — E1165 Type 2 diabetes mellitus with hyperglycemia: Secondary | ICD-10-CM

## 2018-11-13 LAB — GLUCOSE, CAPILLARY
Glucose-Capillary: 170 mg/dL — ABNORMAL HIGH (ref 70–99)
Glucose-Capillary: 191 mg/dL — ABNORMAL HIGH (ref 70–99)
Glucose-Capillary: 144 mg/dL — ABNORMAL HIGH (ref 70–99)
Glucose-Capillary: 137 mg/dL — ABNORMAL HIGH (ref 70–99)

## 2018-11-13 LAB — BASIC METABOLIC PANEL
ANION GAP: 7 (ref 5–15)
BUN: 21 mg/dL (ref 8–23)
CO2: 31 mmol/L (ref 22–32)
Calcium: 8.2 mg/dL — ABNORMAL LOW (ref 8.9–10.3)
Chloride: 102 mmol/L (ref 98–111)
Creatinine, Ser: 0.86 mg/dL (ref 0.61–1.24)
GFR calc Af Amer: >60 mL/min (ref >60)
Glucose, Bld: 176 mg/dL — ABNORMAL HIGH (ref 70–99)
Potassium: 3.3 mmol/L — ABNORMAL LOW (ref 3.5–5.1)
Sodium: 140 mmol/L (ref 135–145)

## 2018-11-13 LAB — CBC
HCT: 36.2 % — ABNORMAL LOW (ref 39.0–52.0)
Hemoglobin: 11.6 g/dL — ABNORMAL LOW (ref 13.0–17.0)
MCH: 30.4 pg (ref 26.0–34.0)
MCHC: 32 g/dL (ref 30.0–36.0)
MCV: 95 fL (ref 80.0–100.0)
PLATELETS: 190 10*3/uL (ref 150–400)
RBC: 3.81 MIL/uL — ABNORMAL LOW (ref 4.22–5.81)
RDW: 12.3 % (ref 11.5–15.5)
WBC: 4.7 10*3/uL (ref 4.0–10.5)
nRBC: 0 % (ref 0.0–0.2)

## 2018-11-13 MED ORDER — POTASSIUM CHLORIDE CRYS ER 20 MEQ PO TBCR
20.0000 meq | EXTENDED_RELEASE_TABLET | Freq: Once | ORAL | Status: AC
  Administered 2018-11-13: 20 meq via ORAL
  Filled 2018-11-13: qty 1

## 2018-11-13 MED ORDER — FUROSEMIDE 10 MG/ML IJ SOLN
40.0000 mg | Freq: Three times a day (TID) | INTRAMUSCULAR | Status: DC
  Administered 2018-11-13 – 2018-11-14 (×3): 40 mg via INTRAVENOUS
  Filled 2018-11-13 (×3): qty 4

## 2018-11-13 MED ORDER — PANTOPRAZOLE SODIUM 40 MG PO TBEC
40.0000 mg | DELAYED_RELEASE_TABLET | Freq: Two times a day (BID) | ORAL | Status: DC
  Administered 2018-11-13 – 2018-11-17 (×8): 40 mg via ORAL
  Filled 2018-11-13 (×8): qty 1

## 2018-11-13 MED ORDER — POTASSIUM CHLORIDE CRYS ER 20 MEQ PO TBCR
40.0000 meq | EXTENDED_RELEASE_TABLET | Freq: Two times a day (BID) | ORAL | Status: DC
  Administered 2018-11-13 – 2018-11-15 (×4): 40 meq via ORAL
  Filled 2018-11-13 (×4): qty 2

## 2018-11-13 MED ORDER — INSULIN GLARGINE 100 UNIT/ML ~~LOC~~ SOLN
9.0000 [IU] | Freq: Every day | SUBCUTANEOUS | Status: DC
  Administered 2018-11-13 – 2018-11-17 (×4): 9 [IU] via SUBCUTANEOUS
  Filled 2018-11-13 (×5): qty 0.09

## 2018-11-13 MED ORDER — LISINOPRIL 5 MG PO TABS
5.0000 mg | ORAL_TABLET | Freq: Every day | ORAL | Status: DC
  Administered 2018-11-14: 5 mg via ORAL
  Filled 2018-11-13: qty 1

## 2018-11-13 MED ORDER — SPIRONOLACTONE 25 MG PO TABS
25.0000 mg | ORAL_TABLET | Freq: Every day | ORAL | Status: DC
  Administered 2018-11-13 – 2018-11-17 (×5): 25 mg via ORAL
  Filled 2018-11-13 (×5): qty 1

## 2018-11-13 MED ORDER — LISINOPRIL 5 MG PO TABS
2.5000 mg | ORAL_TABLET | Freq: Once | ORAL | Status: AC
  Administered 2018-11-13: 2.5 mg via ORAL
  Filled 2018-11-13: qty 1

## 2018-11-13 MED ORDER — ENOXAPARIN SODIUM 40 MG/0.4ML ~~LOC~~ SOLN
40.0000 mg | SUBCUTANEOUS | Status: DC
  Administered 2018-11-13 – 2018-11-17 (×4): 40 mg via SUBCUTANEOUS
  Filled 2018-11-13 (×4): qty 0.4

## 2018-11-13 NOTE — Progress Notes (Signed)
PHARMACIST - PHYSICIAN COMMUNICATION  DR:   Elgergawy  CONCERNING: IV to Oral Route Change Policy  RECOMMENDATION: This patient is receiving pantoprazole by the intravenous route.  Based on criteria approved by the Pharmacy and Therapeutics Committee, the intravenous medication(s) is/are being converted to the equivalent oral dose form(s).   DESCRIPTION: These criteria include:  The patient is eating (either orally or via tube) and/or has been taking other orally administered medications for a least 24 hours  The patient has no evidence of active gastrointestinal bleeding or impaired GI absorption (gastrectomy, short bowel, patient on TNA or NPO).  If you have questions about this conversion, please contact the Pharmacy Department  []   [phone] )  [facility] []   [phone] )  [facility] []   [phone] )  [facility] []   [phone] )  [facility] [x]   [phone] )  [place], PharmD, [place] Pager [phone] 11/13/2018 2:31 PM

## 2018-11-13 NOTE — Progress Notes (Signed)
SATURATION QUALIFICATIONS: (This note is used to comply with regulatory documentation for home oxygen)  Patient Saturations on Room Air at Rest = 95%  Patient Saturations on Room Air while Ambulating = 94%  Patient Saturations on 0 Liters of oxygen while Ambulating = 94%  Please briefly explain why patient needs home oxygen: Pt stable on room air

## 2018-11-13 NOTE — Progress Notes (Signed)
PROGRESS NOTE                                                                                                                                                                                                             Patient Demographics:    Earl Calderon, is a 68 y.o. male, DOB - [birth year], FUX:[id number]  Admit date - 11/11/2018   Admitting Physician Donne Hazel, MD  Outpatient Primary MD for the patient is System, Pcp Not In  LOS - 2   Chief Complaint  Patient presents with  . Hematemesis       Brief Narrative    68 y.o. male with medical history significant of type 2 diabetes, hypertension, inguinal hernia who has not seeing a physician for and has been treating himself with herbal medicines for the past 10 years, patient presents with shortness of breath, worsening lower extremity edema, patient report he is never seen by PCP, work-up significant for hyperglycemia, uncontrolled blood pressure on presentation as well, volume overload, was admitted for further work-up.  Subjective  Ports his dyspnea has significantly improved, but he still unable to lay flat    Assessment  & Plan :    Principal Problem:   Acute combined systolic (congestive) and diastolic (congestive) heart failure (HCC) Active Problems:   Hypertension   Uncontrolled type 2 diabetes mellitus (HCC)   Hypomagnesemia   Hyperbilirubinemia   Abnormal LFTs   LBBB (left bundle branch block)  Combined systolic/diastolic CHF -B echo showing EF 25 to 30%, this appears to be new onset, as well he is having left bundle branch block, he will need ischemic work-up, cardiology input greatly appreciated, plan for cardiac cath on Monday if he can tolerate laying flat. -Continue with IV diuresis, he is -3 L as admission, will increase Lasix to 40 mg IV every 8 hours, monitor lites closely and replete as needed. -We will start on low-dose Aldactone especially in the setting of  hypokalemia, -Started on low-dose lisinopril, was increased to 5 mg today by cardiology, will hold on beta-blockers given acute CHF, will start low-dose Aldactone  Hypertension -Uncontrolled on presentation, but appears to be better controlled after starting low-dose lisinopril and IV Lasix, lisinopril was increased by cardiology today  Uncontrolled type 2 diabetes mellitus ([place]) -Patient reports he is aware of his diabetes mellitus diagnosis, but  reports he was trying and is with diet, A1c came back elevated at 10.9, cussed with him, will start on insulin sliding scale, started on low-dose Lantus,   Hypokalemia -Repleted, tarted on low-dose Aldactone as well . Abnormal LFTs Right upper quadrant ultrasound significant for hemangioma, will need repeat ultrasound in 6 months     Hematemesis Globin stable during hospital stay, no recurrence, Hemoccult negative     Code Status : Full  Family Communication  : none at bedside  Disposition Plan  : Home  Barriers For Discharge : Remains on IV diuresis,  Consults  : Cardiology  Procedures  : None  DVT Prophylaxis  :  SCD,  lovenox  Lab Results  Component Value Date   PLT 190 11/13/2018    Antibiotics  :    Anti-infectives (From admission, onward)   None        Objective:   Vitals:   11/12/18 2044 11/13/18 0500 11/13/18 0555 11/13/18 0712  BP: (!) 160/99  (!) 164/96 135/74  Pulse: 74  84 84  Resp: 18  18   Temp: 98.4 F (36.9 C)  99.1 F (37.3 C)   TempSrc: Oral  Oral   SpO2: 90%  90%   Weight:  70.3 kg    Height:        Wt Readings from Last 3 Encounters:  11/13/18 70.3 kg     Intake/Output Summary (Last 24 hours) at 11/13/2018 1416 Last data filed at 11/13/2018 1215 Gross per 24 hour  Intake 120 ml  Output 1750 ml  Net -1630 ml     Physical Exam  Awake Alert, Oriented X 3, No new F.N deficits, Normal affect Symmetrical Chest wall movement, Minister entry at the bases RRR,No Gallops,Rubs or  new Murmurs, No Parasternal Heave +ve B.Sounds, Abd Soft, No tenderness, No rebound - guarding or rigidity. No Cyanosis, Clubbing , +2 edema, No new Rash or bruise       Data Review:    CBC Recent Labs  Lab 11/11/18 1515 11/12/18 0041 11/13/18 0526  WBC 5.1 5.0 4.7  HGB 13.3 13.0 11.6*  HCT 40.0 40.9 36.2*  PLT 168 189 190  MCV 92.0 94.5 95.0  MCH 30.6 30.0 30.4  MCHC 33.3 31.8 32.0  RDW 12.1 12.2 12.3  LYMPHSABS 1.0 0.9  --   MONOABS 0.4 0.5  --   EOSABS 0.1 0.1  --   BASOSABS 0.0 0.0  --     Chemistries  Recent Labs  Lab 11/11/18 1515 11/12/18 0041 11/13/18 0526  NA 135 138 140  K 3.8 3.5 3.3*  CL 101 98 102  CO2 29 34* 31  GLUCOSE 423* 407* 176*  BUN 14 16 21   CREATININE 0.70 0.81 0.86  CALCIUM 8.5* 8.5* 8.2*  MG 1.5*  --   --   AST 28 25  --   ALT 47* 41  --   ALKPHOS 158* 163*  --   BILITOT 1.3* 1.1  --    ------------------------------------------------------------------------------------------------------------------ No results for input(s): CHOL, HDL, LDLCALC, TRIG, CHOLHDL, LDLDIRECT in the last 72 hours.  Lab Results  Component Value Date   HGBA1C 10.9 (H) 11/11/2018   ------------------------------------------------------------------------------------------------------------------ Recent Labs    11/11/18 1515  TSH 2.755   ------------------------------------------------------------------------------------------------------------------ No results for input(s): VITAMINB12, FOLATE, FERRITIN, TIBC, IRON, RETICCTPCT in the last 72 hours.  Coagulation profile Recent Labs  Lab 11/11/18 1515  INR 1.2    No results for input(s): DDIMER in the last 72 hours.  Cardiac Enzymes Recent Labs  Lab 11/11/18 2350 11/12/18 0048 11/12/18 0547  TROPONINI <0.03 0.03* 0.03*   ------------------------------------------------------------------------------------------------------------------    Component Value Date/Time   BNP 746.4 (H) 11/11/2018  1515    Inpatient Medications  Scheduled Meds: . furosemide  40 mg Intravenous BID  . insulin aspart  0-15 Units Subcutaneous TID WC  . insulin glargine  9 Units Subcutaneous Daily  . [START ON 11/14/2018] lisinopril  5 mg Oral Daily  . nitroGLYCERIN  1 inch Topical Q6H  . pantoprazole (PROTONIX) IV  40 mg Intravenous Q12H  . potassium chloride  40 mEq Oral BID   Continuous Infusions: PRN Meds:.acetaminophen **OR** acetaminophen, hydrALAZINE, ondansetron **OR** ondansetron (ZOFRAN) IV  Micro Results Recent Results (from the past 240 hour(s))  Urine culture     Status: None   Collection Time: 11/11/18  3:15 PM  Result Value Ref Range Status   Specimen Description   Final    URINE, CLEAN CATCH Performed at [facility], [address] [place], [place] [address]    Special Requests   Final    NONE Performed at [facility], [address] [place], [place] [address]    Culture   Final    NO GROWTH Performed at [facility], [place] [address] [place], [place] [address]    Report Status 11/12/2018 FINAL  Final    Radiology Reports Dg Chest 2 View  Result Date: 11/11/2018 CLINICAL DATA:  Vomiting but for several weeks with shortness of breath EXAM: CHEST - 2 VIEW COMPARISON:  10/14/2005 FINDINGS: Cardiac shadow is enlarged. Bilateral pleural effusions and underlying atelectatic changes are noted. No acute bony abnormality is seen. IMPRESSION: Bilateral pleural effusions and likely underlying atelectasis/infiltrate. Electronically Signed   By: Inez Catalina M.D.   On: 11/11/2018 16:21   US Abdomen Limited Ruq  Result Date: 11/12/2018 CLINICAL DATA:  Abnormal liver function tests EXAM: ULTRASOUND ABDOMEN LIMITED RIGHT UPPER QUADRANT COMPARISON:  None. FINDINGS: Gallbladder: No gallstones or wall thickening visualized. No sonographic Murphy sign noted by sonographer. Common bile duct: Diameter: 3 mm. Liver: Hypervascular lesion in the left lobe liver  measuring up to 2.1 cm. No evidence of underlying cirrhosis or other chronic liver disease. Portal vein is patent on color Doppler imaging with normal direction of blood flow towards the liver. Right pleural effusion. IMPRESSION: 1. Hyperechoic lesion in the left lobe measuring 2.1 cm, usually attributed to hemangioma although non specific. If chronic liver disease or metastatic disease, recommend MR. Otherwise, recommend 6 month follow up US. 2. Negative gallbladder. 3. Right pleural effusion. Electronically Signed   By: Monte Fantasia M.D.   On: 11/12/2018 08:42     Phillips Climes M.D on 11/13/2018 at 2:16 PM  Between 7am to 7pm - Pager - [phone]  After 7pm go to [email or web address] - password [facility]  [facility] -  Office  [phone]

## 2018-11-13 NOTE — Consult Note (Signed)
Cardiology Consultation:   Patient ID: Earl Calderon MRN: [id number]; DOB: [birth year]  Admit date: 11/11/2018 Date of Consult: 11/13/2018  Primary Care Provider: System, Pcp Not In Primary Cardiologist: No primary care provider on file.  Primary Electrophysiologist:  None    Patient Profile:   Earl Calderon is a 68 y.o. male with a hx of DM2, inguinal hernia, HTN, who has not seen a physician in 10 years and self medicates with herbal supplements who is being seen today for the evaluation of reduced ejection fraction and heart failure at the request of Dr. Waldron Labs.  History of Present Illness:   Earl Calderon is a pleasant 68 year old gentleman with a history of diabetes mellitus type 2, hypertension, and limited medical care on whom we were consulted for newly reduced ejection fraction with ejection fraction 25 to 30% and moderately increased left ventricular wall thickness with some concern for features of cardiac amyloidosis, as well as uncontrolled hypertension.  He tells me that over the past 2 months he will have episodes of lower extremity swelling, shortness of breath, and phlegm production.  If he changes his diet he notices that this improves.  He feels that his dry weight is 145 pounds (65 kg).  His current weight is 70 kg.  He denies episodes of chest pain or pressure, but does note shortness of breath, primarily with exertion that would occur when he wakes up in the morning.  He denies a strong family history of coronary artery disease, early MI, or sudden cardiac death.  He tells me he is controlled his diabetes with diet and lifestyle for many years, however more recently has had to start taking metformin.  His hemoglobin A1c currently is 10.9.  He has no known history of coronary artery disease.  His echocardiogram reveals a newly reduced ejection fraction, however this is of unknown timeframe since we do not have a previous echo for comparison.  His LV E/E prime is  35.  He denies current chest pain, chest pressure, palpitations, dizziness, lightheadedness, or syncope.  He endorses shortness of breath, especially with exertion, PND, orthopnea, and bilateral leg swelling.  Primary service is currently diuresing the patient with 40 mg of IV Lasix twice daily.  They are treating hypertension with IV hydralazine and initiation of lisinopril at 2.5 mg daily. Past Medical History:  Diagnosis Date  . Diabetes mellitus without complication ([place])   . Hernia, inguinal   . Hypertension   . Uncontrolled type 2 diabetes mellitus ([place]) 11/11/2018    Past Surgical History:  Procedure Laterality Date  . HEMORROIDECTOMY       Home Medications:  Prior to Admission medications   Not on File    Inpatient Medications: Scheduled Meds: . furosemide  40 mg Intravenous BID  . insulin aspart  0-15 Units Subcutaneous TID WC  . insulin glargine  9 Units Subcutaneous Daily  . lisinopril  2.5 mg Oral Once  . [START ON 11/14/2018] lisinopril  5 mg Oral Daily  . nitroGLYCERIN  1 inch Topical Q6H  . pantoprazole (PROTONIX) IV  40 mg Intravenous Q12H  . potassium chloride  20 mEq Oral BID   Continuous Infusions:  PRN Meds: acetaminophen **OR** acetaminophen, hydrALAZINE, ondansetron **OR** ondansetron (ZOFRAN) IV  Allergies:   No Known Allergies  Social History:   Social History   Socioeconomic History  . Marital status: Single    Spouse name: Not on file  . Number of children: Not on file  . Years of education: Not on  file  . Highest education level: Not on file  Occupational History  . Not on file  Social Needs  . Financial resource strain: Not on file  . Food insecurity:    Worry: Not on file    Inability: Not on file  . Transportation needs:    Medical: Not on file    Non-medical: Not on file  Tobacco Use  . Smoking status: Never Smoker  . Smokeless tobacco: Never Used  Substance and Sexual Activity  . Alcohol use: Never    Frequency: Never  .  Drug use: Never  . Sexual activity: Not on file  Lifestyle  . Physical activity:    Days per week: Not on file    Minutes per session: Not on file  . Stress: Not on file  Relationships  . Social connections:    Talks on phone: Not on file    Gets together: Not on file    Attends religious service: Not on file    Active member of club or organization: Not on file    Attends meetings of clubs or organizations: Not on file    Relationship status: Not on file  . Intimate partner violence:    Fear of current or ex partner: Not on file    Emotionally abused: Not on file    Physically abused: Not on file    Forced sexual activity: Not on file  Other Topics Concern  . Not on file  Social History Narrative  . Not on file    Family History:    Family History  Problem Relation Age of Onset  . Hypertension Mother   . Diabetes Mellitus II Mother   . Arrhythmia Mother   . Heart disease Mother   . Hypertension Sister   . Hypertension Brother   . Hypertension Other   . Diabetes Mellitus II Other        All 9 sibblings have HTN     ROS:  Please see the history of present illness.   All other ROS reviewed and negative.     Physical Exam/Data:   Vitals:   11/12/18 2044 11/13/18 0500 11/13/18 0555 11/13/18 0712  BP: (!) 160/99  (!) 164/96 135/74  Pulse: 74  84 84  Resp: 18  18   Temp: 98.4 F (36.9 C)  99.1 F (37.3 C)   TempSrc: Oral  Oral   SpO2: 90%  90%   Weight:  70.3 kg    Height:        Intake/Output Summary (Last 24 hours) at 11/13/2018 0959 Last data filed at 11/13/2018 2297 Gross per 24 hour  Intake 120 ml  Output 900 ml  Net -780 ml   Last 3 Weights 11/13/2018 11/12/2018 11/11/2018  Weight (lbs) 155 lb 156 lb 9.6 oz 160 lb 11.2 oz  Weight (kg) 70.308 kg 71.033 kg 72.893 kg     Body mass index is 22.24 kg/m.  General:  Well nourished, well developed, in no acute distress HEENT: normal Neck: no JVD Endocrine:  No thryomegaly Vascular: No carotid bruits;  FA pulses 2+ bilaterally without bruits  Cardiac:  normal S1, S2; RRR; no murmur  Lungs:  clear to auscultation bilaterally, no wheezing, rhonchi or rales  Abd: soft, nontender, no hepatomegaly  Ext: Trace to 1+ bilateral edema Musculoskeletal:  No deformities, BUE and BLE strength normal and equal Skin: warm and dry  Neuro:  CNs 2-12 intact, no focal abnormalities noted Psych:  Normal affect   EKG:  The EKG was personally reviewed and demonstrates: Sinus rhythm, left lower branch block, PVCs.  Telemetry:  Telemetry was personally reviewed and demonstrates: Occasional PVCs, sinus rhythm  Relevant CV Studies: Echo 11/12/2018 1. The left ventricle has severely reduced systolic function, with an ejection fraction of 25-30%. The cavity size was normal. There is moderately increased left ventricular wall thickness. Cardiac amyloidosis is a consideration based on the appearance  of the LV myocardium. Left ventricular diastolic Doppler parameters are consistent with impaired relaxation. Left ventricular diffuse hypokinesis.  2. The right ventricle has mildly reduced systolic function. The cavity was normal. There is mildly increased right ventricular wall thickness.  3. Left atrial size was severely dilated.  4. Right atrial size was mildly dilated.  5. Moderate pleural effusion in the left lateral region.  6. Trivial pericardial effusion is present.  7. No evidence of mitral valve stenosis. Trivial mitral regurgitation.  8. The aortic valve is tricuspid Mild calcification of the aortic valve. no stenosis of the aortic valve.  9. The aortic root and ascending aorta are normal in size and structure. 10. The inferior vena cava was normal in size with <50% respiratory variability. PA systolic pressure 41 mmHg.   Laboratory Data:  Chemistry Recent Labs  Lab 11/11/18 1515 11/12/18 0041 11/13/18 0526  NA 135 138 140  K 3.8 3.5 3.3*  CL 101 98 102  CO2 29 34* 31  GLUCOSE 423* 407* 176*   BUN 14 16 21   CREATININE 0.70 0.81 0.86  CALCIUM 8.5* 8.5* 8.2*  GFRNONAA >60 >60 >60  GFRAA >60 >60 >60  ANIONGAP 5 6 7     Recent Labs  Lab 11/11/18 1515 11/12/18 0041  PROT 7.0 6.7  ALBUMIN 3.1* 2.9*  AST 28 25  ALT 47* 41  ALKPHOS 158* 163*  BILITOT 1.3* 1.1   Hematology Recent Labs  Lab 11/11/18 1515 11/12/18 0041 11/13/18 0526  WBC 5.1 5.0 4.7  RBC 4.35 4.33 3.81*  HGB 13.3 13.0 11.6*  HCT 40.0 40.9 36.2*  MCV 92.0 94.5 95.0  MCH 30.6 30.0 30.4  MCHC 33.3 31.8 32.0  RDW 12.1 12.2 12.3  PLT 168 189 190   Cardiac Enzymes Recent Labs  Lab 11/11/18 1515 11/11/18 2350 11/12/18 0048 11/12/18 0547  TROPONINI <0.03 <0.03 0.03* 0.03*   No results for input(s): TROPIPOC in the last 168 hours.  BNP Recent Labs  Lab 11/11/18 1515  BNP 746.4*    DDimer No results for input(s): DDIMER in the last 168 hours.  Radiology/Studies:  Dg Chest 2 View  Result Date: 11/11/2018 CLINICAL DATA:  Vomiting but for several weeks with shortness of breath EXAM: CHEST - 2 VIEW COMPARISON:  10/14/2005 FINDINGS: Cardiac shadow is enlarged. Bilateral pleural effusions and underlying atelectatic changes are noted. No acute bony abnormality is seen. IMPRESSION: Bilateral pleural effusions and likely underlying atelectasis/infiltrate. Electronically Signed   By: Inez Catalina M.D.   On: 11/11/2018 16:21   US Abdomen Limited Ruq  Result Date: 11/12/2018 CLINICAL DATA:  Abnormal liver function tests EXAM: ULTRASOUND ABDOMEN LIMITED RIGHT UPPER QUADRANT COMPARISON:  None. FINDINGS: Gallbladder: No gallstones or wall thickening visualized. No sonographic Murphy sign noted by sonographer. Common bile duct: Diameter: 3 mm. Liver: Hypervascular lesion in the left lobe liver measuring up to 2.1 cm. No evidence of underlying cirrhosis or other chronic liver disease. Portal vein is patent on color Doppler imaging with normal direction of blood flow towards the liver. Right pleural effusion.  IMPRESSION: 1. Hyperechoic lesion in  the left lobe measuring 2.1 cm, usually attributed to hemangioma although non specific. If chronic liver disease or metastatic disease, recommend MR. Otherwise, recommend 6 month follow up US. 2. Negative gallbladder. 3. Right pleural effusion. Electronically Signed   By: Monte Fantasia M.D.   On: 11/12/2018 08:42    Assessment and Plan:   Principal Problem:   Acute combined systolic (congestive) and diastolic (congestive) heart failure (HCC) Active Problems:   Hypertension   Uncontrolled type 2 diabetes mellitus (HCC)   Hypomagnesemia   Hyperbilirubinemia   Abnormal LFTs  Acute combined systolic and diastolic heart failure - continue diuresis with IV Lasix 40 mg twice daily.  His dry weight is 65 kg and his admission weight was 72.6 kg.  He is currently at 70.3 kg, and making progress.  He is net -3 L since admission.  He still feels somewhat hesitant to lay flat.  This will limit our ability to perform an ischemic work-up.  He has new left bundle branch block and a seemingly newly reduced ejection fraction.  He will require a coronary angiogram prior to hospital dismissal.  We can tentatively plan for this on Monday if he is able to lie flat.  He is currently not using supplemental oxygen which is encouraging.  -Plan for coronary angiogram Monday if the patient is able to lie flat -Continue IV Lasix 40 mg twice daily. - Continue to monitor renal function, currently stable.  Hypertension- he is tolerating lisinopril well.  We will increase the dose of lisinopril from 2.5 mg to 5 mg, to hopefully liberate him from IV hydralazine which he is received once daily over the past 2 days.  He may require additional medication therapy, this can be assessed at close follow-up.  Hypokalemia-will increase potassium supplementation to 40 mEq twice daily while diuresing with IV Lasix.  Check bmet in the morning.  Diabetes mellitus type 2, hemoglobin A1c 10.9-per  primary service.      For questions or updates, please contact [place] Please consult [email or web address] for contact info under     Signed, Elouise Munroe, MD  11/13/2018 9:59 AM

## 2018-11-14 LAB — BASIC METABOLIC PANEL WITH GFR
Anion gap: 10 (ref 5–15)
BUN: 23 mg/dL (ref 8–23)
CO2: 31 mmol/L (ref 22–32)
Calcium: 8.6 mg/dL — ABNORMAL LOW (ref 8.9–10.3)
Chloride: 102 mmol/L (ref 98–111)
Creatinine, Ser: 0.94 mg/dL (ref 0.61–1.24)
GFR calc Af Amer: >60 mL/min
GFR calc non Af Amer: >60 mL/min
Glucose, Bld: 164 mg/dL — ABNORMAL HIGH (ref 70–99)
Potassium: 3.9 mmol/L (ref 3.5–5.1)
Sodium: 143 mmol/L (ref 135–145)

## 2018-11-14 LAB — GLUCOSE, CAPILLARY
GLUCOSE-CAPILLARY: 190 mg/dL — AB (ref 70–99)
Glucose-Capillary: 144 mg/dL — ABNORMAL HIGH (ref 70–99)
Glucose-Capillary: 209 mg/dL — ABNORMAL HIGH (ref 70–99)
Glucose-Capillary: 245 mg/dL — ABNORMAL HIGH (ref 70–99)

## 2018-11-14 LAB — MAGNESIUM: Magnesium: 1.6 mg/dL — ABNORMAL LOW (ref 1.7–2.4)

## 2018-11-14 MED ORDER — SODIUM CHLORIDE 0.9 % IV SOLN: INTRAVENOUS | Status: DC

## 2018-11-14 MED ORDER — MAGNESIUM SULFATE 2 GM/50ML IV SOLN
2.0000 g | Freq: Once | INTRAVENOUS | Status: AC
  Administered 2018-11-14: 2 g via INTRAVENOUS
  Filled 2018-11-14: qty 50

## 2018-11-14 MED ORDER — SODIUM CHLORIDE 0.9% FLUSH: 3.0000 mL | INTRAVENOUS | Status: DC | PRN

## 2018-11-14 MED ORDER — FUROSEMIDE 10 MG/ML IJ SOLN
40.0000 mg | Freq: Two times a day (BID) | INTRAMUSCULAR | Status: DC
  Administered 2018-11-14 – 2018-11-16 (×4): 40 mg via INTRAVENOUS
  Filled 2018-11-14 (×4): qty 4

## 2018-11-14 MED ORDER — ASPIRIN 81 MG PO CHEW
81.0000 mg | CHEWABLE_TABLET | ORAL | Status: AC
  Administered 2018-11-15: 81 mg via ORAL
  Filled 2018-11-14: qty 1

## 2018-11-14 MED ORDER — LOSARTAN POTASSIUM 50 MG PO TABS: 50.0000 mg | ORAL_TABLET | Freq: Every day | ORAL | Status: DC

## 2018-11-14 MED ORDER — SODIUM CHLORIDE 0.9% FLUSH
3.0000 mL | Freq: Two times a day (BID) | INTRAVENOUS | Status: DC
  Administered 2018-11-15: 3 mL via INTRAVENOUS

## 2018-11-14 MED ORDER — SODIUM CHLORIDE 0.9 % IV SOLN: 250.0000 mL | INTRAVENOUS | Status: DC | PRN

## 2018-11-14 NOTE — Progress Notes (Signed)
PROGRESS NOTE                                                                                                                                                                                                             Patient Demographics:    Earl Calderon, is a 68 y.o. male, DOB - 12/21/1950, GXQ:[id number]  Admit date - 11/11/2018   Admitting Physician Donne Hazel, MD  Outpatient Primary MD for the patient is System, Pcp Not In  LOS - 3   Chief Complaint  Patient presents with  . Hematemesis       Brief Narrative    68 y.o. male with medical history significant of type 2 diabetes, hypertension, inguinal hernia who has not seeing a physician for and has been treating himself with herbal medicines for the past 10 years, patient presents with shortness of breath, worsening lower extremity edema, patient report he is never seen by PCP, work-up significant for hyperglycemia, uncontrolled blood pressure on presentation as well, volume overload, was admitted for further work-up.  Subjective  Ports his dyspnea has significantly improved, able to lay flat, no cough, no chest pain    Assessment  & Plan :    Principal Problem:   Acute combined systolic (congestive) and diastolic (congestive) heart failure (HCC) Active Problems:   Hypertension   Uncontrolled type 2 diabetes mellitus (HCC)   Hypomagnesemia   Hyperbilirubinemia   Abnormal LFTs   LBBB (left bundle branch block)  Combined systolic/diastolic CHF -2D echo showing EF 25 to 30%, this appears to be new onset, as well he is having left bundle branch block, he will need ischemic work-up, cardiology input greatly appreciated, plan for cardiac cath on Monday tomorrow if schedule allows. -Continue with IV diuresis, diuresis very well yesterday with -3.8 L in last 24 hours, -7 L since admission, will go back on his Lasix to 40 mg twice daily. -On low-dose Aldactone -Tachycardia in the  setting of acute CHF. -Initially on lisinopril, transitioned to losartan likely will need Entresto.  Hypertension -Uncontrolled on presentation, but appears to be better controlled after starting low-dose lisinopril and IV Lasix, lisinopril was increased by cardiology today  Uncontrolled type 2 diabetes mellitus ([place]) -Patient reports he is aware of his diabetes mellitus diagnosis, but reports he was trying and is with diet, A1c came back elevated  at 10.9, cussed with him, will start on insulin sliding scale, started on low-dose Lantus,   Hypokalemia -Repleted, tarted on low-dose Aldactone as well  Hypomagnesemia -Repleted, recheck in a.m. . Abnormal LFTs Right upper quadrant ultrasound significant for hemangioma, will need repeat ultrasound in 6 months     Hematemesis Globin stable during hospital stay, no recurrence, Hemoccult negative     Code Status : Full  Family Communication  : none at bedside  Disposition Plan  : Home  Barriers For Discharge : Remains on IV diuresis,  Consults  : Cardiology  Procedures  : None  DVT Prophylaxis  :  SCD, [place] lovenox  Lab Results  Component Value Date   PLT 190 11/13/2018    Antibiotics  :    Anti-infectives (From admission, onward)   None        Objective:   Vitals:   11/13/18 1552 11/13/18 2053 11/14/18 0640 11/14/18 1338  BP: (!) 153/86 (!) 154/88 (!) 159/92 (!) 163/91  Pulse: 79 74 75 66  Resp: 18 18 18 15   Temp: 98.8 F (37.1 C) 98.5 F (36.9 C) 98.2 F (36.8 C) 97.7 F (36.5 C)  TempSrc: Oral Oral Oral Oral  SpO2: 97% 96% 95% 95%  Weight:   67.4 kg   Height:        Wt Readings from Last 3 Encounters:  11/14/18 67.4 kg     Intake/Output Summary (Last 24 hours) at 11/14/2018 1437 Last data filed at 11/14/2018 1342 Gross per 24 hour  Intake 360 ml  Output 3400 ml  Net -3040 ml     Physical Exam  Awake Alert, Oriented X 3, No new F.N deficits, Normal affect Symmetrical Chest wall movement,  Good air movement bilaterally, CTAB RRR,No Gallops,Rubs or new Murmurs, No Parasternal Heave +ve B.Sounds, Abd Soft, No tenderness, No rebound - guarding or rigidity. No Cyanosis, Clubbing ,+1 edema, No new Rash or bruise        Data Review:    CBC Recent Labs  Lab 11/11/18 1515 11/12/18 0041 11/13/18 0526  WBC 5.1 5.0 4.7  HGB 13.3 13.0 11.6*  HCT 40.0 40.9 36.2*  PLT 168 189 190  MCV 92.0 94.5 95.0  MCH 30.6 30.0 30.4  MCHC 33.3 31.8 32.0  RDW 12.1 12.2 12.3  LYMPHSABS 1.0 0.9  --   MONOABS 0.4 0.5  --   EOSABS 0.1 0.1  --   BASOSABS 0.0 0.0  --     Chemistries  Recent Labs  Lab 11/11/18 1515 11/12/18 0041 11/13/18 0526 11/14/18 0525  NA 135 138 140 143  K 3.8 3.5 3.3* 3.9  CL 101 98 102 102  CO2 29 34* 31 31  GLUCOSE 423* 407* 176* 164*  BUN 14 16 21 23   CREATININE 0.70 0.81 0.86 0.94  CALCIUM 8.5* 8.5* 8.2* 8.6*  MG 1.5*  --   --  1.6*  AST 28 25  --   --   ALT 47* 41  --   --   ALKPHOS 158* 163*  --   --   BILITOT 1.3* 1.1  --   --    ------------------------------------------------------------------------------------------------------------------ No results for input(s): CHOL, HDL, LDLCALC, TRIG, CHOLHDL, LDLDIRECT in the last 72 hours.  Lab Results  Component Value Date   HGBA1C 10.9 (H) 11/11/2018   ------------------------------------------------------------------------------------------------------------------ Recent Labs    11/11/18 1515  TSH 2.755   ------------------------------------------------------------------------------------------------------------------ No results for input(s): VITAMINB12, FOLATE, FERRITIN, TIBC, IRON, RETICCTPCT in the last 72 hours.  Coagulation profile Recent Labs  Lab 11/11/18 1515  INR 1.2    No results for input(s): DDIMER in the last 72 hours.  Cardiac Enzymes Recent Labs  Lab 11/11/18 2350 11/12/18 0048 11/12/18 0547  TROPONINI <0.03 0.03* 0.03*    ------------------------------------------------------------------------------------------------------------------    Component Value Date/Time   BNP 746.4 (H) 11/11/2018 1515    Inpatient Medications  Scheduled Meds: . enoxaparin (LOVENOX) injection  40 mg Subcutaneous Q24H  . furosemide  40 mg Intravenous Q12H  . insulin aspart  0-15 Units Subcutaneous TID WC  . insulin glargine  9 Units Subcutaneous Daily  . [START ON 11/15/2018] losartan  50 mg Oral Daily  . nitroGLYCERIN  1 inch Topical Q6H  . pantoprazole  40 mg Oral BID  . potassium chloride  40 mEq Oral BID  . spironolactone  25 mg Oral Daily   Continuous Infusions: PRN Meds:.acetaminophen **OR** acetaminophen, hydrALAZINE, ondansetron **OR** ondansetron (ZOFRAN) IV  Micro Results Recent Results (from the past 240 hour(s))  Urine culture     Status: None   Collection Time: 11/11/18  3:15 PM  Result Value Ref [place] Status   Specimen Description   Final    URINE, CLEAN CATCH Performed at [facility], [address] [place], [place] [address]    Special Requests   Final    NONE Performed at [facility], [address] [place], [place] [address]    Culture   Final    NO GROWTH Performed at [facility], [place] [address], [place], [place] [address]    Report Status 11/12/2018 FINAL  Final    Radiology Reports Dg Chest 2 View  Result Date: 11/11/2018 CLINICAL DATA:  Vomiting but for several weeks with shortness of breath EXAM: CHEST - 2 VIEW COMPARISON:  10/14/2005 FINDINGS: Cardiac shadow is enlarged. Bilateral pleural effusions and underlying atelectatic changes are noted. No acute bony abnormality is seen. IMPRESSION: Bilateral pleural effusions and likely underlying atelectasis/infiltrate. Electronically Signed   By: Inez Catalina M.D.   On: 11/11/2018 16:21   US Abdomen Limited Ruq  Result Date: 11/12/2018 CLINICAL DATA:  Abnormal liver function tests EXAM: ULTRASOUND ABDOMEN  LIMITED RIGHT UPPER QUADRANT COMPARISON:  None. FINDINGS: Gallbladder: No gallstones or wall thickening visualized. No sonographic Murphy sign noted by sonographer. Common bile duct: Diameter: 3 mm. Liver: Hypervascular lesion in the left lobe liver measuring up to 2.1 cm. No evidence of underlying cirrhosis or other chronic liver disease. Portal vein is patent on color Doppler imaging with normal direction of blood flow towards the liver. Right pleural effusion. IMPRESSION: 1. Hyperechoic lesion in the left lobe measuring 2.1 cm, usually attributed to hemangioma although non specific. If chronic liver disease or metastatic disease, recommend MR. Otherwise, recommend 6 month follow up US. 2. Negative gallbladder. 3. Right pleural effusion. Electronically Signed   By: Monte Fantasia M.D.   On: 11/12/2018 08:42     Phillips Climes M.D on 11/14/2018 at 2:37 PM  Between 7am to 7pm - Pager - [phone]  After 7pm go to [email or web address] - password [facility]  [facility] -  Office  [phone]

## 2018-11-14 NOTE — Progress Notes (Signed)
Progress Note  Patient Name: Earl Calderon Date of Encounter: 11/14/2018  Primary Cardiologist: No primary care provider on file.   Subjective   Feeling well and lying flat in bed when I arrived for exam. On room air.  Inpatient Medications    Scheduled Meds: . enoxaparin (LOVENOX) injection  40 mg Subcutaneous Q24H  . furosemide  40 mg Intravenous Q8H  . insulin aspart  0-15 Units Subcutaneous TID WC  . insulin glargine  9 Units Subcutaneous Daily  . lisinopril  5 mg Oral Daily  . nitroGLYCERIN  1 inch Topical Q6H  . pantoprazole  40 mg Oral BID  . potassium chloride  40 mEq Oral BID  . spironolactone  25 mg Oral Daily   Continuous Infusions: . magnesium sulfate 1 - 4 g bolus IVPB 2 g (11/14/18 0921)   PRN Meds: acetaminophen **OR** acetaminophen, hydrALAZINE, ondansetron **OR** ondansetron (ZOFRAN) IV   Vital Signs    Vitals:   11/13/18 0712 11/13/18 1552 11/13/18 2053 11/14/18 0640  BP: 135/74 (!) 153/86 (!) 154/88 (!) 159/92  Pulse: 84 79 74 75  Resp:  18 18 18   Temp:  98.8 F (37.1 C) 98.5 F (36.9 C) 98.2 F (36.8 C)  TempSrc:  Oral Oral Oral  SpO2:  97% 96% 95%  Weight:    67.4 kg  Height:        Intake/Output Summary (Last 24 hours) at 11/14/2018 0952 Last data filed at 11/14/2018 0600 Gross per 24 hour  Intake 120 ml  Output 3925 ml  Net -3805 ml   Filed Weights   11/12/18 0500 11/13/18 0500 11/14/18 0640  Weight: 71 kg 70.3 kg 67.4 kg    Telemetry    LBBB - Personally Reviewed  ECG    No new - Personally Reviewed  Physical Exam   GEN: No acute distress.   Neck: No JVD Cardiac: regular rhythm, normal rate, no murmurs, rubs, or gallops.  Respiratory: Clear to auscultation bilaterally. GI: Soft, nontender, non-distended  MS: trace edema; No deformity. Neuro:  Nonfocal  Psych: Normal affect   Labs    Chemistry Recent Labs  Lab 11/11/18 1515 11/12/18 0041 11/13/18 0526 11/14/18 0525  NA 135 138 140 143  K 3.8 3.5 3.3*  3.9  CL 101 98 102 102  CO2 29 34* 31 31  GLUCOSE 423* 407* 176* 164*  BUN 14 16 21 23   CREATININE 0.70 0.81 0.86 0.94  CALCIUM 8.5* 8.5* 8.2* 8.6*  PROT 7.0 6.7  --   --   ALBUMIN 3.1* 2.9*  --   --   AST 28 25  --   --   ALT 47* 41  --   --   ALKPHOS 158* 163*  --   --   BILITOT 1.3* 1.1  --   --   GFRNONAA >60 >60 >60 >60  GFRAA >60 >60 >60 >60  ANIONGAP 5 6 7 10      Hematology Recent Labs  Lab 11/11/18 1515 11/12/18 0041 11/13/18 0526  WBC 5.1 5.0 4.7  RBC 4.35 4.33 3.81*  HGB 13.3 13.0 11.6*  HCT 40.0 40.9 36.2*  MCV 92.0 94.5 95.0  MCH 30.6 30.0 30.4  MCHC 33.3 31.8 32.0  RDW 12.1 12.2 12.3  PLT 168 189 190    Cardiac Enzymes Recent Labs  Lab 11/11/18 1515 11/11/18 2350 11/12/18 0048 11/12/18 0547  TROPONINI <0.03 <0.03 0.03* 0.03*   No results for input(s): TROPIPOC in the last 168 hours.   BNP  Recent Labs  Lab 11/11/18 1515  BNP 746.4*     DDimer No results for input(s): DDIMER in the last 168 hours.   Radiology    No results found.  Cardiac Studies   Echo 11/12/2018 1. The left ventricle has severely reduced systolic function, with an ejection fraction of 25-30%. The cavity size was normal. There is moderately increased left ventricular wall thickness. Cardiac amyloidosis is a consideration based on the appearance  of the LV myocardium. Left ventricular diastolic Doppler parameters are consistent with impaired relaxation. Left ventricular diffuse hypokinesis. 2. The right ventricle has mildly reduced systolic function. The cavity was normal. There is mildly increased right ventricular wall thickness. 3. Left atrial size was severely dilated. 4. Right atrial size was mildly dilated. 5. Moderate pleural effusion in the left lateral region. 6. Trivial pericardial effusion is present. 7. No evidence of mitral valve stenosis. Trivial mitral regurgitation. 8. The aortic valve is tricuspid Mild calcification of the aortic valve. no stenosis  of the aortic valve. 9. The aortic root and ascending aorta are normal in size and structure. 10. The inferior vena cava was normal in size with <50% respiratory variability. PA systolic pressure 41 mmHg.  Patient Profile     Earl Calderon is a 68 y.o. male with a hx of DM2, inguinal hernia, HTN, who has not seen a physician in 10 years and self medicates with herbal supplements who is being seen today for the evaluation of reduced ejection fraction and heart failure at the request of Dr. Waldron Labs.  Assessment & Plan   Principal Problem:   Acute combined systolic (congestive) and diastolic (congestive) heart failure (HCC) Active Problems:   Hypertension   Uncontrolled type 2 diabetes mellitus (HCC)   Hypomagnesemia   Hyperbilirubinemia   Abnormal LFTs   LBBB (left bundle branch block)   Continues to diurese well. Discussed with Dr. Waldron Labs.  Received lasix 40 mg IV TID yesterday, we can decrease to 40 mg IV Bid today. Can transition to oral diuresis tomorrow. He is negative 7 L, weight 67 kg.   Will washout lisinopril (last dose 11/14/2018 at 10 am) and begin ARB with losartan 50 mg daily (first dose will be 11/15/2018 at 6pm) as patient will likely benefit from [facility] due to systolic heart failure. Losartan can be discontinued as needed. Delene Loll can likely be initiated 3/17 am or as outpatient.   Will plan for diagnostic coronary angiogram on 11/15/2018 pending lab availability, for workup of S-CHF.   Continue spironolactone for additional HTN therapy.   For questions or updates, please contact [place] Please consult [email or web address] for contact info under        Signed, Elouise Munroe, MD  11/14/2018, 9:52 AM

## 2018-11-15 ENCOUNTER — Encounter (HOSPITAL_COMMUNITY)
Admission: EM | Disposition: A | Discharge status: Home / Self Care | Payer: Self-pay | Source: Home / Self Care | Attending: Internal Medicine

## 2018-11-15 DIAGNOSIS — I160 Hypertensive urgency: Secondary | ICD-10-CM

## 2018-11-15 DIAGNOSIS — I2510 Atherosclerotic heart disease of native coronary artery without angina pectoris: Secondary | ICD-10-CM

## 2018-11-15 HISTORY — PX: CORONARY STENT INTERVENTION: CATH118234

## 2018-11-15 HISTORY — PX: RIGHT/LEFT HEART CATH AND CORONARY ANGIOGRAPHY: CATH118266

## 2018-11-15 LAB — POCT I-STAT EG7
Acid-Base Excess: 8 mmol/L — ABNORMAL HIGH (ref 0.0–2.0)
Bicarbonate: 34.2 mmol/L — ABNORMAL HIGH (ref 20.0–28.0)
CALCIUM ION: 1.21 mmol/L (ref 1.15–1.40)
HCT: 38 % — ABNORMAL LOW (ref 39.0–52.0)
HEMOGLOBIN: 12.9 g/dL — AB (ref 13.0–17.0)
O2 Saturation: 66 %
PH VEN: 7.394 (ref 7.250–7.430)
Potassium: 4.1 mmol/L (ref 3.5–5.1)
Sodium: 141 mmol/L (ref 135–145)
TCO2: 36 mmol/L — ABNORMAL HIGH (ref 22–32)
pCO2, Ven: 56 mmHg (ref 44.0–60.0)
pO2, Ven: 36 mmHg (ref 32.0–45.0)

## 2018-11-15 LAB — BASIC METABOLIC PANEL
ANION GAP: 6 (ref 5–15)
BUN: 21 mg/dL (ref 8–23)
CHLORIDE: 102 mmol/L (ref 98–111)
CO2: 32 mmol/L (ref 22–32)
Calcium: 8.7 mg/dL — ABNORMAL LOW (ref 8.9–10.3)
Creatinine, Ser: 0.83 mg/dL (ref 0.61–1.24)
GFR calc non Af Amer: >60 mL/min (ref >60)
Glucose, Bld: 187 mg/dL — ABNORMAL HIGH (ref 70–99)
Potassium: 4.5 mmol/L (ref 3.5–5.1)
Sodium: 140 mmol/L (ref 135–145)

## 2018-11-15 LAB — CBC
HCT: 40.1 % (ref 39.0–52.0)
Hemoglobin: 13.2 g/dL (ref 13.0–17.0)
MCH: 30.8 pg (ref 26.0–34.0)
MCHC: 32.9 g/dL (ref 30.0–36.0)
MCV: 93.7 fL (ref 80.0–100.0)
Platelets: 207 10*3/uL (ref 150–400)
RBC: 4.28 MIL/uL (ref 4.22–5.81)
RDW: 12.2 % (ref 11.5–15.5)
WBC: 4.9 10*3/uL (ref 4.0–10.5)
nRBC: 0 % (ref 0.0–0.2)

## 2018-11-15 LAB — GLUCOSE, CAPILLARY
Glucose-Capillary: 159 mg/dL — ABNORMAL HIGH (ref 70–99)
Glucose-Capillary: 155 mg/dL — ABNORMAL HIGH (ref 70–99)
Glucose-Capillary: 147 mg/dL — ABNORMAL HIGH (ref 70–99)
Glucose-Capillary: 302 mg/dL — ABNORMAL HIGH (ref 70–99)

## 2018-11-15 LAB — POCT I-STAT 7, (LYTES, BLD GAS, ICA,H+H)
Acid-Base Excess: 7 mmol/L — ABNORMAL HIGH (ref 0.0–2.0)
Bicarbonate: 33.9 mmol/L — ABNORMAL HIGH (ref 20.0–28.0)
Calcium, Ion: 1.22 mmol/L (ref 1.15–1.40)
HCT: 39 % (ref 39.0–52.0)
Hemoglobin: 13.3 g/dL (ref 13.0–17.0)
O2 Saturation: 92 %
Potassium: 4 mmol/L (ref 3.5–5.1)
Sodium: 141 mmol/L (ref 135–145)
TCO2: 36 mmol/L — ABNORMAL HIGH (ref 22–32)
pCO2 arterial: 55.9 mmHg — ABNORMAL HIGH (ref 32.0–48.0)
pH, Arterial: 7.39 (ref 7.350–7.450)
pO2, Arterial: 65 mmHg — ABNORMAL LOW (ref 83.0–108.0)

## 2018-11-15 LAB — POCT ACTIVATED CLOTTING TIME
Activated Clotting Time: 263 seconds
Activated Clotting Time: 296 seconds

## 2018-11-15 SURGERY — RIGHT/LEFT HEART CATH AND CORONARY ANGIOGRAPHY
Anesthesia: LOCAL

## 2018-11-15 MED ORDER — SODIUM CHLORIDE 0.9 % IV SOLN: 250.0000 mL | INTRAVENOUS | Status: DC | PRN

## 2018-11-15 MED ORDER — SODIUM CHLORIDE 0.9% FLUSH: 3.0000 mL | INTRAVENOUS | Status: DC | PRN

## 2018-11-15 MED ORDER — FENTANYL CITRATE (PF) 100 MCG/2ML IJ SOLN
INTRAMUSCULAR | Status: DC | PRN
  Administered 2018-11-15: 25 ug via INTRAVENOUS

## 2018-11-15 MED ORDER — ANGIOPLASTY BOOK
Freq: Once | Status: AC
  Administered 2018-11-15: 21:00:00

## 2018-11-15 MED ORDER — LOSARTAN POTASSIUM 25 MG PO TABS
25.0000 mg | ORAL_TABLET | Freq: Every day | ORAL | Status: DC
  Administered 2018-11-15: 19:00:00 25 mg via ORAL
  Filled 2018-11-15 (×2): qty 1

## 2018-11-15 MED ORDER — HYDRALAZINE HCL 20 MG/ML IJ SOLN
5.0000 mg | INTRAMUSCULAR | Status: AC | PRN
  Filled 2018-11-15: qty 1

## 2018-11-15 MED ORDER — TICAGRELOR 90 MG PO TABS
ORAL_TABLET | ORAL | Status: AC
  Filled 2018-11-15: qty 2

## 2018-11-15 MED ORDER — HEPARIN SODIUM (PORCINE) 1000 UNIT/ML IJ SOLN
INTRAMUSCULAR | Status: DC | PRN
  Administered 2018-11-15: 5000 [IU] via INTRAVENOUS
  Administered 2018-11-15: 3500 [IU] via INTRAVENOUS

## 2018-11-15 MED ORDER — NITROGLYCERIN 1 MG/10 ML FOR IR/CATH LAB
INTRA_ARTERIAL | Status: DC | PRN
  Administered 2018-11-15: 200 ug via INTRA_ARTERIAL

## 2018-11-15 MED ORDER — IOHEXOL 350 MG/ML SOLN
INTRAVENOUS | Status: DC | PRN
  Administered 2018-11-15: 90 mL via INTRACARDIAC

## 2018-11-15 MED ORDER — SODIUM CHLORIDE 0.9% FLUSH: 3.0000 mL | Freq: Two times a day (BID) | INTRAVENOUS | Status: DC

## 2018-11-15 MED ORDER — TICAGRELOR 90 MG PO TABS
ORAL_TABLET | ORAL | Status: DC | PRN
  Administered 2018-11-15: 180 mg via ORAL

## 2018-11-15 MED ORDER — CARVEDILOL 6.25 MG PO TABS
6.2500 mg | ORAL_TABLET | Freq: Two times a day (BID) | ORAL | Status: DC
  Administered 2018-11-15 – 2018-11-16 (×2): 6.25 mg via ORAL
  Filled 2018-11-15: qty 1
  Filled 2018-11-15: qty 2
  Filled 2018-11-15: qty 1
  Filled 2018-11-15: qty 2

## 2018-11-15 MED ORDER — SODIUM CHLORIDE 0.9 % IV SOLN: INTRAVENOUS | Status: AC

## 2018-11-15 MED ORDER — INSULIN ASPART 100 UNIT/ML ~~LOC~~ SOLN
4.0000 [IU] | Freq: Once | SUBCUTANEOUS | Status: AC
  Administered 2018-11-15: 4 [IU] via SUBCUTANEOUS

## 2018-11-15 MED ORDER — HEPARIN SODIUM (PORCINE) 1000 UNIT/ML IJ SOLN
INTRAMUSCULAR | Status: DC | PRN
  Administered 2018-11-15: 3000 [IU] via INTRAVENOUS

## 2018-11-15 MED ORDER — MIDAZOLAM HCL 2 MG/2ML IJ SOLN
INTRAMUSCULAR | Status: AC
  Filled 2018-11-15: qty 2

## 2018-11-15 MED ORDER — LABETALOL HCL 5 MG/ML IV SOLN
10.0000 mg | INTRAVENOUS | Status: AC | PRN
  Administered 2018-11-15: 16:00:00 10 mg via INTRAVENOUS
  Filled 2018-11-15: qty 4

## 2018-11-15 MED ORDER — VERAPAMIL HCL 2.5 MG/ML IV SOLN
INTRAVENOUS | Status: AC
  Filled 2018-11-15: qty 2

## 2018-11-15 MED ORDER — VERAPAMIL HCL 2.5 MG/ML IV SOLN
INTRAVENOUS | Status: DC | PRN
  Administered 2018-11-15: 10 mL via INTRA_ARTERIAL

## 2018-11-15 MED ORDER — ASPIRIN 81 MG PO CHEW
81.0000 mg | CHEWABLE_TABLET | Freq: Every day | ORAL | Status: DC
  Administered 2018-11-16 – 2018-11-17 (×2): 81 mg via ORAL
  Filled 2018-11-15 (×2): qty 1

## 2018-11-15 MED ORDER — LIDOCAINE HCL (PF) 1 % IJ SOLN
INTRAMUSCULAR | Status: AC
  Filled 2018-11-15: qty 30

## 2018-11-15 MED ORDER — NITROGLYCERIN 1 MG/10 ML FOR IR/CATH LAB
INTRA_ARTERIAL | Status: AC
  Filled 2018-11-15: qty 10

## 2018-11-15 MED ORDER — HEPARIN SODIUM (PORCINE) 1000 UNIT/ML IJ SOLN
INTRAMUSCULAR | Status: AC
  Filled 2018-11-15: qty 1

## 2018-11-15 MED ORDER — ONDANSETRON HCL 4 MG/2ML IJ SOLN: 4.0000 mg | Freq: Four times a day (QID) | INTRAMUSCULAR | Status: DC | PRN

## 2018-11-15 MED ORDER — TICAGRELOR 90 MG PO TABS
90.0000 mg | ORAL_TABLET | Freq: Two times a day (BID) | ORAL | Status: DC
  Administered 2018-11-16 – 2018-11-17 (×2): 90 mg via ORAL
  Filled 2018-11-15 (×2): qty 1

## 2018-11-15 MED ORDER — MIDAZOLAM HCL 2 MG/2ML IJ SOLN
INTRAMUSCULAR | Status: DC | PRN
  Administered 2018-11-15: 2 mg via INTRAVENOUS

## 2018-11-15 MED ORDER — HEPARIN (PORCINE) IN NACL 1000-0.9 UT/500ML-% IV SOLN
INTRAVENOUS | Status: DC | PRN
  Administered 2018-11-15 (×2): 500 mL

## 2018-11-15 MED ORDER — ACETAMINOPHEN 325 MG PO TABS
650.0000 mg | ORAL_TABLET | ORAL | Status: DC | PRN
  Administered 2018-11-15 – 2018-11-17 (×2): 650 mg via ORAL
  Filled 2018-11-15 (×2): qty 2

## 2018-11-15 MED ORDER — FENTANYL CITRATE (PF) 100 MCG/2ML IJ SOLN
INTRAMUSCULAR | Status: AC
  Filled 2018-11-15: qty 2

## 2018-11-15 MED ORDER — SODIUM CHLORIDE 0.9 % IV SOLN: INTRAVENOUS | Status: DC

## 2018-11-15 MED ORDER — HEPARIN (PORCINE) IN NACL 1000-0.9 UT/500ML-% IV SOLN
INTRAVENOUS | Status: AC
  Filled 2018-11-15: qty 1000

## 2018-11-15 MED ORDER — TICAGRELOR 90 MG PO TABS
90.0000 mg | ORAL_TABLET | Freq: Once | ORAL | Status: AC
  Administered 2018-11-16: 90 mg via ORAL
  Filled 2018-11-15: qty 1

## 2018-11-15 MED ORDER — LIDOCAINE HCL (PF) 1 % IJ SOLN
INTRAMUSCULAR | Status: DC | PRN
  Administered 2018-11-15 (×2): 2 mL

## 2018-11-15 MED ORDER — ASPIRIN 81 MG PO CHEW: 81.0000 mg | CHEWABLE_TABLET | ORAL | Status: DC

## 2018-11-15 MED ORDER — SODIUM CHLORIDE 0.9% FLUSH
3.0000 mL | Freq: Two times a day (BID) | INTRAVENOUS | Status: DC
  Administered 2018-11-15 – 2018-11-17 (×4): 3 mL via INTRAVENOUS

## 2018-11-15 SURGICAL SUPPLY — 21 items
BALLN SAPPHIRE 2.0X15 (BALLOONS) ×2
BALLN SAPPHIRE ~~LOC~~ 2.75X12 (BALLOONS) ×2 IMPLANT
BALLOON SAPPHIRE 2.0X15 (BALLOONS) ×1 IMPLANT
CATH 5FR JL3.5 JR4 ANG PIG MP (CATHETERS) ×2 IMPLANT
CATH BALLN WEDGE 5F 110CM (CATHETERS) ×2 IMPLANT
CATH INFINITI 5 FR 3DRC (CATHETERS) ×2 IMPLANT
CATH LAUNCHER 6FR EBU3.5 (CATHETERS) ×2 IMPLANT
DEVICE RAD COMP TR BAND LRG (VASCULAR PRODUCTS) ×2 IMPLANT
GLIDESHEATH SLEND SS 6F .021 (SHEATH) ×2 IMPLANT
GUIDEWIRE INQWIRE 1.5J.035X260 (WIRE) ×2 IMPLANT
INQWIRE 1.5J .035X260CM (WIRE) ×4
KIT ENCORE 26 ADVANTAGE (KITS) ×2 IMPLANT
KIT HEART LEFT (KITS) ×2 IMPLANT
KIT HEMO VALVE WATCHDOG (MISCELLANEOUS) ×2 IMPLANT
PACK CARDIAC CATHETERIZATION (CUSTOM PROCEDURE TRAY) ×2 IMPLANT
SHEATH GLIDE SLENDER 4/5FR (SHEATH) ×2 IMPLANT
STENT SYNERGY DES 2.5X16 (Permanent Stent) ×2 IMPLANT
TRANSDUCER W/STOPCOCK (MISCELLANEOUS) ×2 IMPLANT
TUBING CIL FLEX 10 FLL-RA (TUBING) ×2 IMPLANT
WIRE ASAHI PROWATER 180CM (WIRE) ×2 IMPLANT
WIRE HI TORQ VERSACORE-J 145CM (WIRE) ×2 IMPLANT

## 2018-11-15 NOTE — Progress Notes (Addendum)
TRB BAND REMOVAL  LOCATION:    right radial  DEFLATED PER PROTOCOL:    Yes.    TIME BAND OFF / DRESSING APPLIED:    1925   SITE UPON ARRIVAL:    Level 0  SITE AFTER BAND REMOVAL:    Level 0  CIRCULATION SENSATION AND MOVEMENT:    Within Normal Limits   Yes.    COMMENTS:   TOLERATED PROCEDURE WELL

## 2018-11-15 NOTE — Progress Notes (Deleted)
TR BAND REMOVAL  LOCATION:    right radial  DEFLATED PER PROTOCOL:    Yes.    TIME BAND OFF / DRESSING APPLIED:    1925   SITE UPON ARRIVAL:    Level 0  SITE AFTER BAND REMOVAL:    Level 0  CIRCULATION SENSATION AND MOVEMENT:    Within Normal Limits   Yes.    COMMENTS:   Post TR band instructions given, sterile dressing applied, good capillary refill.

## 2018-11-15 NOTE — Interval H&P Note (Signed)
Cath Lab Visit (complete for each Cath Lab visit)  Clinical Evaluation Leading to the Procedure:   ACS: Yes.    Non-ACS:    Anginal Classification: CCS IV  Anti-ischemic medical therapy: Minimal Therapy (1 class of medications)  Non-Invasive Test Results: High-risk stress test findings: cardiac mortality >3%/year  Low EF by echo  Prior CABG: No previous CABG   Low EF   History and Physical Interval Note:  11/15/2018 1:31 PM  Earl Calderon  has presented today for surgery, with the diagnosis of heart failure.  The various methods of treatment have been discussed with the patient and family. After consideration of risks, benefits and other options for treatment, the patient has consented to  Procedure(s): LEFT HEART CATH AND CORONARY ANGIOGRAPHY (N/A) as a surgical intervention.  The patient's history has been reviewed, patient examined, no change in status, stable for surgery.  I have reviewed the patient's chart and labs.  Questions were answered to the patient's satisfaction.     Larae Grooms

## 2018-11-15 NOTE — Progress Notes (Signed)
CBG tonight was 302 mg/dL,NP on call ( K. Schorr) paged and ordered 4 units Novolog insulin  subcutaneous once.

## 2018-11-15 NOTE — H&P (View-Only) (Signed)
Progress Note  Patient Name: Earl Calderon Date of Encounter: 11/15/2018  Primary Cardiologist: Elouise Munroe, MD   Subjective   Pt scheduled for heart cath today. Very upset he can't eat. No chest pain, swelling is improved.  Inpatient Medications    Scheduled Meds: . enoxaparin (LOVENOX) injection  40 mg Subcutaneous Q24H  . furosemide  40 mg Intravenous Q12H  . insulin aspart  0-15 Units Subcutaneous TID WC  . insulin glargine  9 Units Subcutaneous Daily  . losartan  50 mg Oral Daily  . nitroGLYCERIN  1 inch Topical Q6H  . pantoprazole  40 mg Oral BID  . potassium chloride  40 mEq Oral BID  . sodium chloride flush  3 mL Intravenous Q12H  . spironolactone  25 mg Oral Daily   Continuous Infusions: . sodium chloride    . sodium chloride     PRN Meds: sodium chloride, acetaminophen **OR** acetaminophen, hydrALAZINE, ondansetron **OR** ondansetron (ZOFRAN) IV, sodium chloride flush   Vital Signs    Vitals:   11/14/18 1338 11/14/18 1420 11/14/18 2135 11/15/18 0457  BP: (!) 163/91 (!) 142/88 (!) 167/98 (!) 151/96  Pulse: 66  68 72  Resp: 15  16 16   Temp: 97.7 F (36.5 C)  98 F (36.7 C) 97.7 F (36.5 C)  TempSrc: Oral  Oral Oral  SpO2: 95%  100% 98%  Weight:    65.3 kg  Height:        Intake/Output Summary (Last 24 hours) at 11/15/2018 0847 Last data filed at 11/15/2018 0725 Gross per 24 hour  Intake 480 ml  Output 2450 ml  Net -1970 ml   Last 3 Weights 11/15/2018 11/14/2018 11/13/2018  Weight (lbs) 144 lb 148 lb 8 oz 155 lb  Weight (kg) 65.318 kg 67.359 kg 70.308 kg      Telemetry    Sinus with bundle - Personally Reviewed  ECG    sinus with LBBB - Personally Reviewed  Physical Exam   GEN: No acute distress, sitting up in chair  Neck: No JVD Cardiac: RRR, no murmurs, rubs, or gallops.  Respiratory: Clear to auscultation bilaterally but diminished in bases. GI: Soft, nontender, non-distended  MS: 1+ B LE edema; No deformity. Neuro:   Nonfocal  Psych: Normal affect   Labs    Chemistry Recent Labs  Lab 11/11/18 1515 11/12/18 0041 11/13/18 0526 11/14/18 0525 11/15/18 0522  NA 135 138 140 143 140  K 3.8 3.5 3.3* 3.9 4.5  CL 101 98 102 102 102  CO2 29 34* 31 31 32  GLUCOSE 423* 407* 176* 164* 187*  BUN 14 16 21 23 21   CREATININE 0.70 0.81 0.86 0.94 0.83  CALCIUM 8.5* 8.5* 8.2* 8.6* 8.7*  PROT 7.0 6.7  --   --   --   ALBUMIN 3.1* 2.9*  --   --   --   AST 28 25  --   --   --   ALT 47* 41  --   --   --   ALKPHOS 158* 163*  --   --   --   BILITOT 1.3* 1.1  --   --   --   GFRNONAA >60 >60 >60 >60 >60  GFRAA >60 >60 >60 >60 >60  ANIONGAP 5 6 7 10 6      Hematology Recent Labs  Lab 11/12/18 0041 11/13/18 0526 11/15/18 0522  WBC 5.0 4.7 4.9  RBC 4.33 3.81* 4.28  HGB 13.0 11.6* 13.2  HCT 40.9 36.2*  40.1  MCV 94.5 95.0 93.7  MCH 30.0 30.4 30.8  MCHC 31.8 32.0 32.9  RDW 12.2 12.3 12.2  PLT 189 190 207    Cardiac Enzymes Recent Labs  Lab 11/11/18 1515 11/11/18 2350 11/12/18 0048 11/12/18 0547  TROPONINI <0.03 <0.03 0.03* 0.03*   No results for input(s): TROPIPOC in the last 168 hours.   BNP Recent Labs  Lab 11/11/18 1515  BNP 746.4*     DDimer No results for input(s): DDIMER in the last 168 hours.   Radiology    No results found.  Cardiac Studies   Echo 11/12/18: 1. The left ventricle has severely reduced systolic function, with an ejection fraction of 25-30%. The cavity size was normal. There is moderately increased left ventricular wall thickness. Cardiac amyloidosis is a consideration based on the appearance  of the LV myocardium. Left ventricular diastolic Doppler parameters are consistent with impaired relaxation. Left ventricular diffuse hypokinesis.  2. The right ventricle has mildly reduced systolic function. The cavity was normal. There is mildly increased right ventricular wall thickness.  3. Left atrial size was severely dilated.  4. Right atrial size was mildly dilated.  5.  Moderate pleural effusion in the left lateral region.  6. Trivial pericardial effusion is present.  7. No evidence of mitral valve stenosis. Trivial mitral regurgitation.  8. The aortic valve is tricuspid Mild calcification of the aortic valve. no stenosis of the aortic valve.  9. The aortic root and ascending aorta are normal in size and structure. 10. The inferior vena cava was normal in size with <50% respiratory variability. PA systolic pressure 41 mmHg.  Patient Profile     68 y.o. male with a hx of DM2, inguinal hernia, HTN, who has not seen a physician in 10 years and self medicates with herbal supplements. Echo with reduced EF, going for left heart cath today. He denies orthopnea and chest pain this morning.   Assessment & Plan    1. New diagnosis of systolic heart failure - EF of 25-30%, unknown onset - pt denies chest pain and orthopnea - plan for left heart cath today - he is overall net negative 9L with 2.3 L urine output yesterday - diuresing on 40 mg IV BID lasix - weight is 144 lbs down from 160 lbs on admission   2. Hypertension - was receiving IV hydralazine once daily for elevated pressure (3/13 and 3/14) - lisinopril was transitioned to losartan - would be a good candidate for entresto - will continue to adjust after heart cath - continue spiro   3. Hypokalemia - resovled   4. DM - A1c 10.9% - per primary service      For questions or updates, please contact [place] Please consult [email or web address] for contact info under        Signed, Ledora Bottcher, PA  11/15/2018, 8:47 AM

## 2018-11-15 NOTE — Progress Notes (Signed)
Received pt from [facility] via [place].  Pt alert and oriented X4, skin warm and dry no complaints of any discomfort at this time.  IVF started , pt on monitor and consent signed.  Dr Irish Lack  to see pt and to explain  cath procedure.  Pt to procedure room via  [place].

## 2018-11-15 NOTE — Progress Notes (Signed)
Progress Note  Patient Name: Earl Calderon Date of Encounter: 11/15/2018  Primary Cardiologist: Elouise Munroe, MD   Subjective   Pt scheduled for heart cath today. Very upset he can't eat. No chest pain, swelling is improved.  Inpatient Medications    Scheduled Meds: . enoxaparin (LOVENOX) injection  40 mg Subcutaneous Q24H  . furosemide  40 mg Intravenous Q12H  . insulin aspart  0-15 Units Subcutaneous TID WC  . insulin glargine  9 Units Subcutaneous Daily  . losartan  50 mg Oral Daily  . nitroGLYCERIN  1 inch Topical Q6H  . pantoprazole  40 mg Oral BID  . potassium chloride  40 mEq Oral BID  . sodium chloride flush  3 mL Intravenous Q12H  . spironolactone  25 mg Oral Daily   Continuous Infusions: . sodium chloride    . sodium chloride     PRN Meds: sodium chloride, acetaminophen **OR** acetaminophen, hydrALAZINE, ondansetron **OR** ondansetron (ZOFRAN) IV, sodium chloride flush   Vital Signs    Vitals:   11/14/18 1338 11/14/18 1420 11/14/18 2135 11/15/18 0457  BP: (!) 163/91 (!) 142/88 (!) 167/98 (!) 151/96  Pulse: 66  68 72  Resp: 15  16 16   Temp: 97.7 F (36.5 C)  98 F (36.7 C) 97.7 F (36.5 C)  TempSrc: Oral  Oral Oral  SpO2: 95%  100% 98%  Weight:    65.3 kg  Height:        Intake/Output Summary (Last 24 hours) at 11/15/2018 0847 Last data filed at 11/15/2018 0725 Gross per 24 hour  Intake 480 ml  Output 2450 ml  Net -1970 ml   Last 3 Weights 11/15/2018 11/14/2018 11/13/2018  Weight (lbs) 144 lb 148 lb 8 oz 155 lb  Weight (kg) 65.318 kg 67.359 kg 70.308 kg      Telemetry    Sinus with bundle - Personally Reviewed  ECG    sinus with LBBB - Personally Reviewed  Physical Exam   GEN: No acute distress, sitting up in chair  Neck: No JVD Cardiac: RRR, no murmurs, rubs, or gallops.  Respiratory: Clear to auscultation bilaterally but diminished in bases. GI: Soft, nontender, non-distended  MS: 1+ B LE edema; No deformity. Neuro:   Nonfocal  Psych: Normal affect   Labs    Chemistry Recent Labs  Lab 11/11/18 1515 11/12/18 0041 11/13/18 0526 11/14/18 0525 11/15/18 0522  NA 135 138 140 143 140  K 3.8 3.5 3.3* 3.9 4.5  CL 101 98 102 102 102  CO2 29 34* 31 31 32  GLUCOSE 423* 407* 176* 164* 187*  BUN 14 16 21 23 21   CREATININE 0.70 0.81 0.86 0.94 0.83  CALCIUM 8.5* 8.5* 8.2* 8.6* 8.7*  PROT 7.0 6.7  --   --   --   ALBUMIN 3.1* 2.9*  --   --   --   AST 28 25  --   --   --   ALT 47* 41  --   --   --   ALKPHOS 158* 163*  --   --   --   BILITOT 1.3* 1.1  --   --   --   GFRNONAA >60 >60 >60 >60 >60  GFRAA >60 >60 >60 >60 >60  ANIONGAP 5 6 7 10 6      Hematology Recent Labs  Lab 11/12/18 0041 11/13/18 0526 11/15/18 0522  WBC 5.0 4.7 4.9  RBC 4.33 3.81* 4.28  HGB 13.0 11.6* 13.2  HCT 40.9 36.2*  40.1  MCV 94.5 95.0 93.7  MCH 30.0 30.4 30.8  MCHC 31.8 32.0 32.9  RDW 12.2 12.3 12.2  PLT 189 190 207    Cardiac Enzymes Recent Labs  Lab 11/11/18 1515 11/11/18 2350 11/12/18 0048 11/12/18 0547  TROPONINI <0.03 <0.03 0.03* 0.03*   No results for input(s): TROPIPOC in the last 168 hours.   BNP Recent Labs  Lab 11/11/18 1515  BNP 746.4*     DDimer No results for input(s): DDIMER in the last 168 hours.   Radiology    No results found.  Cardiac Studies   Echo 11/12/18: 1. The left ventricle has severely reduced systolic function, with an ejection fraction of 25-30%. The cavity size was normal. There is moderately increased left ventricular wall thickness. Cardiac amyloidosis is a consideration based on the appearance  of the LV myocardium. Left ventricular diastolic Doppler parameters are consistent with impaired relaxation. Left ventricular diffuse hypokinesis.  2. The right ventricle has mildly reduced systolic function. The cavity was normal. There is mildly increased right ventricular wall thickness.  3. Left atrial size was severely dilated.  4. Right atrial size was mildly dilated.  5.  Moderate pleural effusion in the left lateral region.  6. Trivial pericardial effusion is present.  7. No evidence of mitral valve stenosis. Trivial mitral regurgitation.  8. The aortic valve is tricuspid Mild calcification of the aortic valve. no stenosis of the aortic valve.  9. The aortic root and ascending aorta are normal in size and structure. 10. The inferior vena cava was normal in size with <50% respiratory variability. PA systolic pressure 41 mmHg.  Patient Profile     68 y.o. male with a hx of DM2, inguinal hernia, HTN, who has not seen a physician in 10 years and self medicates with herbal supplements. Echo with reduced EF, going for left heart cath today. He denies orthopnea and chest pain this morning.   Assessment & Plan    1. New diagnosis of systolic heart failure - EF of 25-30%, unknown onset - pt denies chest pain and orthopnea - plan for left heart cath today - he is overall net negative 9L with 2.3 L urine output yesterday - diuresing on 40 mg IV BID lasix - weight is 144 lbs down from 160 lbs on admission   2. Hypertension - was receiving IV hydralazine once daily for elevated pressure (3/13 and 3/14) - lisinopril was transitioned to losartan - would be a good candidate for entresto - will continue to adjust after heart cath - continue spiro   3. Hypokalemia - resovled   4. DM - A1c 10.9% - per primary service      For questions or updates, please contact [place] Please consult [email or web address] for contact info under        Signed, Ledora Bottcher, PA  11/15/2018, 8:47 AM

## 2018-11-15 NOTE — Progress Notes (Signed)
PROGRESS NOTE                                                                                                                                                                                                             Patient Demographics:    Earl Calderon, is a 68 y.o. male, DOB - [birth year], XTK:[id number]  Admit date - 11/11/2018   Admitting Physician Donne Hazel, MD  Outpatient Primary MD for the patient is System, Pcp Not In  LOS - 4   Chief Complaint  Patient presents with  . Hematemesis       Brief Narrative    68 y.o. male with medical history significant of type 2 diabetes, hypertension, inguinal hernia who has not seeing a physician for and has been treating himself with herbal medicines for the past 10 years, patient presents with shortness of breath, worsening lower extremity edema, patient report he is never seen by PCP, work-up significant for hyperglycemia, uncontrolled blood pressure on presentation as well, volume overload, was admitted for further work-up.  Once he came back significantly elevated at 10.9, his 2D echo showing EF 25 to 30%, improving with diuresis, seen by cardiology, plan for cardiac cath today  Subjective  Denies any dyspnea today, no chest pain     Assessment  & Plan :    Principal Problem:   Acute combined systolic (congestive) and diastolic (congestive) heart failure (HCC) Active Problems:   Hypertension   Uncontrolled type 2 diabetes mellitus (HCC)   Hypomagnesemia   Hyperbilirubinemia   Abnormal LFTs   LBBB (left bundle branch block)   Hypertensive urgency  Combined systolic/diastolic CHF -2D echo showing EF 25 to 30%, this appears to be new onset, as well he is having left bundle branch block, he will need ischemic work-up, cardiology input greatly appreciated, plan for cardiac cath today. -Significantly improving with IV diuresis, so far -8.5 L since admission, -1.6 L over last 24 hours,  continue with IV Lasix 40 mg twice daily. -Noted on low-dose Aldactone -Used to be on lisinopril, transition to losartan yesterday in anticipation for need of Entresto -Further management per cardiology  Hypertension -Uncontrolled on presentation, but appears to be better controlled after starting low-dose lisinopril and IV Lasix, lisinopril was increased by cardiology today  Uncontrolled type 2 diabetes mellitus ([place]) -Patient reports he is aware  of his diabetes mellitus diagnosis, but reports he was trying and is with diet, A1c came back elevated at 10.9, cussed with him, will start on insulin sliding scale, started on low-dose Lantus,   Hypokalemia -Repleted, tarted on low-dose Aldactone as well  Hypomagnesemia -Repleted,  . Abnormal LFTs Right upper quadrant ultrasound significant for hemangioma, will need repeat ultrasound in 6 months     Hematemesis Globin stable during hospital stay, no recurrence, Hemoccult negative     Code Status : Full  Family Communication  : none at bedside  Disposition Plan  : Home  Barriers For Discharge : Remains on IV diuresis,  Consults  : Cardiology  Procedures  : None  DVT Prophylaxis  :  SCD, [place] lovenox  Lab Results  Component Value Date   PLT 207 11/15/2018    Antibiotics  :    Anti-infectives (From admission, onward)   None        Objective:   Vitals:   11/15/18 0457 11/15/18 1315 11/15/18 1320 11/15/18 1344  BP: (!) 151/96 (!) 185/96 (!) 188/105   Pulse: 72 71 70   Resp: 16 (!) 9 15   Temp: 97.7 F (36.5 C)     TempSrc: Oral     SpO2: 98% 96% 96% 98%  Weight: 65.3 kg     Height:        Wt Readings from Last 3 Encounters:  11/15/18 65.3 kg     Intake/Output Summary (Last 24 hours) at 11/15/2018 1409 Last data filed at 11/15/2018 1200 Gross per 24 hour  Intake -  Output 4000 ml  Net -4000 ml     Physical Exam  Awake Alert, Oriented X 3, No new F.N deficits, Normal affect Symmetrical Chest wall  movement, Good air movement bilaterally, CTAB RRR,No Gallops,Rubs or new Murmurs, No Parasternal Heave +ve B.Sounds, Abd Soft, No tenderness, No rebound - guarding or rigidity. No Cyanosis, Clubbing , +1 edema, No new Rash or bruise         Data Review:    CBC Recent Labs  Lab 11/11/18 1515 11/12/18 0041 11/13/18 0526 11/15/18 0522  WBC 5.1 5.0 4.7 4.9  HGB 13.3 13.0 11.6* 13.2  HCT 40.0 40.9 36.2* 40.1  PLT 168 189 190 207  MCV 92.0 94.5 95.0 93.7  MCH 30.6 30.0 30.4 30.8  MCHC 33.3 31.8 32.0 32.9  RDW 12.1 12.2 12.3 12.2  LYMPHSABS 1.0 0.9  --   --   MONOABS 0.4 0.5  --   --   EOSABS 0.1 0.1  --   --   BASOSABS 0.0 0.0  --   --     Chemistries  Recent Labs  Lab 11/11/18 1515 11/12/18 0041 11/13/18 0526 11/14/18 0525 11/15/18 0522  NA 135 138 140 143 140  K 3.8 3.5 3.3* 3.9 4.5  CL 101 98 102 102 102  CO2 29 34* 31 31 32  GLUCOSE 423* 407* 176* 164* 187*  BUN 14 16 21 23 21   CREATININE 0.70 0.81 0.86 0.94 0.83  CALCIUM 8.5* 8.5* 8.2* 8.6* 8.7*  MG 1.5*  --   --  1.6*  --   AST 28 25  --   --   --   ALT 47* 41  --   --   --   ALKPHOS 158* 163*  --   --   --   BILITOT 1.3* 1.1  --   --   --    ------------------------------------------------------------------------------------------------------------------ No results for input(s): CHOL,  HDL, LDLCALC, TRIG, CHOLHDL, LDLDIRECT in the last 72 hours.  Lab Results  Component Value Date   HGBA1C 10.9 (H) 11/11/2018   ------------------------------------------------------------------------------------------------------------------ No results for input(s): TSH, T4TOTAL, T3FREE, THYROIDAB in the last 72 hours.  Invalid input(s): FREET3 ------------------------------------------------------------------------------------------------------------------ No results for input(s): VITAMINB12, FOLATE, FERRITIN, TIBC, IRON, RETICCTPCT in the last 72 hours.  Coagulation profile Recent Labs  Lab 11/11/18 1515  INR  1.2    No results for input(s): DDIMER in the last 72 hours.  Cardiac Enzymes Recent Labs  Lab 11/11/18 2350 11/12/18 0048 11/12/18 0547  TROPONINI <0.03 0.03* 0.03*   ------------------------------------------------------------------------------------------------------------------    Component Value Date/Time   BNP 746.4 (H) 11/11/2018 1515    Inpatient Medications  Scheduled Meds: . carvedilol  6.25 mg Oral BID WC  . [MAR Hold] enoxaparin (LOVENOX) injection  40 mg Subcutaneous Q24H  . [MAR Hold] furosemide  40 mg Intravenous Q12H  . [MAR Hold] insulin aspart  0-15 Units Subcutaneous TID WC  . [MAR Hold] insulin glargine  9 Units Subcutaneous Daily  . losartan  25 mg Oral Daily  . [MAR Hold] nitroGLYCERIN  1 inch Topical Q6H  . [MAR Hold] pantoprazole  40 mg Oral BID  . [MAR Hold] potassium chloride  40 mEq Oral BID  . sodium chloride flush  3 mL Intravenous Q12H  . sodium chloride flush  3 mL Intravenous Q12H  . [MAR Hold] spironolactone  25 mg Oral Daily   Continuous Infusions: . sodium chloride    . sodium chloride    . sodium chloride     PRN Meds:.sodium chloride, sodium chloride, [MAR Hold] acetaminophen **OR** [MAR Hold] acetaminophen, fentaNYL, Heparin (Porcine) in NaCl, [MAR Hold] hydrALAZINE, lidocaine (PF), midazolam, [MAR Hold] ondansetron **OR** [MAR Hold] ondansetron (ZOFRAN) IV, Radial Cocktail/Verapamil only, sodium chloride flush, sodium chloride flush  Micro Results Recent Results (from the past 240 hour(s))  Urine culture     Status: None   Collection Time: 11/11/18  3:15 PM  Result Value Ref Range Status   Specimen Description   Final    URINE, CLEAN CATCH Performed at [facility], [address] [place], [place] [address]    Special Requests   Final    NONE Performed at [facility], [address] [place], [place] [address]    Culture   Final    NO GROWTH Performed at [facility], [place] [address]  [place], [place] [address]    Report Status 11/12/2018 FINAL  Final    Radiology Reports Dg Chest 2 View  Result Date: 11/11/2018 CLINICAL DATA:  Vomiting but for several weeks with shortness of breath EXAM: CHEST - 2 VIEW COMPARISON:  10/14/2005 FINDINGS: Cardiac shadow is enlarged. Bilateral pleural effusions and underlying atelectatic changes are noted. No acute bony abnormality is seen. IMPRESSION: Bilateral pleural effusions and likely underlying atelectasis/infiltrate. Electronically Signed   By: Inez Catalina M.D.   On: 11/11/2018 16:21   US Abdomen Limited Ruq  Result Date: 11/12/2018 CLINICAL DATA:  Abnormal liver function tests EXAM: ULTRASOUND ABDOMEN LIMITED RIGHT UPPER QUADRANT COMPARISON:  None. FINDINGS: Gallbladder: No gallstones or wall thickening visualized. No sonographic Murphy sign noted by sonographer. Common bile duct: Diameter: 3 mm. Liver: Hypervascular lesion in the left lobe liver measuring up to 2.1 cm. No evidence of underlying cirrhosis or other chronic liver disease. Portal vein is patent on color Doppler imaging with normal direction of blood flow towards the liver. Right pleural effusion.  IMPRESSION: 1. Hyperechoic lesion in the left lobe measuring 2.1 cm, usually attributed to hemangioma although non specific. If chronic liver disease or metastatic disease, recommend MR. Otherwise, recommend 6 month follow up US. 2. Negative gallbladder. 3. Right pleural effusion. Electronically Signed   By: Monte Fantasia M.D.   On: 11/12/2018 08:42     Phillips Climes M.D on 11/15/2018 at 2:09 PM  Between 7am to 7pm - Pager - [phone]  After 7pm go to [email or web address] - password [facility]  [facility] -  Office  [phone]

## 2018-11-16 ENCOUNTER — Encounter (HOSPITAL_COMMUNITY): Payer: Self-pay | Admitting: Interventional Cardiology

## 2018-11-16 DIAGNOSIS — I447 Left bundle-branch block, unspecified: Secondary | ICD-10-CM

## 2018-11-16 DIAGNOSIS — E7800 Pure hypercholesterolemia, unspecified: Secondary | ICD-10-CM

## 2018-11-16 LAB — LIPID PANEL
Cholesterol: 134 mg/dL (ref 0–200)
HDL: 44 mg/dL (ref >40)
LDL Cholesterol: 81 mg/dL (ref 0–99)
Total CHOL/HDL Ratio: 3 RATIO
Triglycerides: 46 mg/dL (ref <150)
VLDL: 9 mg/dL (ref 0–40)

## 2018-11-16 LAB — GLUCOSE, CAPILLARY
GLUCOSE-CAPILLARY: 244 mg/dL — AB (ref 70–99)
GLUCOSE-CAPILLARY: 336 mg/dL — AB (ref 70–99)
Glucose-Capillary: 162 mg/dL — ABNORMAL HIGH (ref 70–99)
Glucose-Capillary: 182 mg/dL — ABNORMAL HIGH (ref 70–99)

## 2018-11-16 LAB — BASIC METABOLIC PANEL
Anion gap: 7 (ref 5–15)
BUN: 21 mg/dL (ref 8–23)
CO2: 29 mmol/L (ref 22–32)
Calcium: 8.3 mg/dL — ABNORMAL LOW (ref 8.9–10.3)
Chloride: 100 mmol/L (ref 98–111)
Creatinine, Ser: 0.97 mg/dL (ref 0.61–1.24)
GFR calc Af Amer: >60 mL/min (ref >60)
GFR calc non Af Amer: >60 mL/min (ref >60)
Glucose, Bld: 324 mg/dL — ABNORMAL HIGH (ref 70–99)
Potassium: 3.7 mmol/L (ref 3.5–5.1)
SODIUM: 136 mmol/L (ref 135–145)

## 2018-11-16 LAB — CBC
HCT: 34.4 % — ABNORMAL LOW (ref 39.0–52.0)
Hemoglobin: 11.7 g/dL — ABNORMAL LOW (ref 13.0–17.0)
MCH: 30.9 pg (ref 26.0–34.0)
MCHC: 34 g/dL (ref 30.0–36.0)
MCV: 90.8 fL (ref 80.0–100.0)
Platelets: 199 10*3/uL (ref 150–400)
RBC: 3.79 MIL/uL — ABNORMAL LOW (ref 4.22–5.81)
RDW: 11.8 % (ref 11.5–15.5)
WBC: 4.7 10*3/uL (ref 4.0–10.5)
nRBC: 0 % (ref 0.0–0.2)

## 2018-11-16 LAB — MAGNESIUM: Magnesium: 1.5 mg/dL — ABNORMAL LOW (ref 1.7–2.4)

## 2018-11-16 MED ORDER — ROSUVASTATIN CALCIUM 20 MG PO TABS
40.0000 mg | ORAL_TABLET | Freq: Every day | ORAL | Status: DC
  Administered 2018-11-16: 40 mg via ORAL
  Filled 2018-11-16: qty 2

## 2018-11-16 MED ORDER — CARVEDILOL 12.5 MG PO TABS
12.5000 mg | ORAL_TABLET | Freq: Two times a day (BID) | ORAL | Status: DC
  Administered 2018-11-16 – 2018-11-17 (×3): 12.5 mg via ORAL
  Filled 2018-11-16 (×3): qty 1

## 2018-11-16 MED ORDER — INSULIN ASPART 100 UNIT/ML ~~LOC~~ SOLN
0.0000 [IU] | Freq: Three times a day (TID) | SUBCUTANEOUS | Status: DC
  Administered 2018-11-16: 3 [IU] via SUBCUTANEOUS
  Administered 2018-11-16 – 2018-11-17 (×2): 11 [IU] via SUBCUTANEOUS
  Administered 2018-11-17: 06:00:00 5 [IU] via SUBCUTANEOUS

## 2018-11-16 MED ORDER — SACUBITRIL-VALSARTAN 24-26 MG PO TABS
1.0000 | ORAL_TABLET | Freq: Two times a day (BID) | ORAL | Status: DC
  Administered 2018-11-16 – 2018-11-17 (×3): 1 via ORAL
  Filled 2018-11-16 (×4): qty 1

## 2018-11-16 MED ORDER — LIVING WELL WITH DIABETES BOOK
Freq: Once | Status: AC
  Administered 2018-11-17: 03:00:00

## 2018-11-16 MED ORDER — MAGNESIUM SULFATE 2 GM/50ML IV SOLN
2.0000 g | Freq: Once | INTRAVENOUS | Status: AC
  Administered 2018-11-16: 12:00:00 2 g via INTRAVENOUS
  Filled 2018-11-16 (×2): qty 50

## 2018-11-16 MED ORDER — INSULIN ASPART 100 UNIT/ML ~~LOC~~ SOLN: 0.0000 [IU] | Freq: Every day | SUBCUTANEOUS | Status: DC

## 2018-11-16 NOTE — Care Management Important Message (Signed)
Important Message  Patient Details  Name: Earl Calderon MRN: [id number] Date of Birth: Oct 25, 1950   Medicare Important Message Given:  Yes    Barb Merino Tiffiney Sparrow 11/16/2018, 3:23 PM

## 2018-11-16 NOTE — TOC Benefit Eligibility Note (Signed)
Transition of Care [facility]) Benefit Eligibility Note    Patient Details  Name: Earl Calderon MRN: [id number] Date of Birth: 06-24-1951   Medication/Dose: Kary Kos 90 BID  Covered?: Yes     Prescription Coverage [facility] [address]  Spoke with Person/Company/Phone Number:: [facility] [phone]  Co-Pay: $3.90  Prior Approval: No          [place] Phone Number: 11/16/2018, 10:08 AM

## 2018-11-16 NOTE — Progress Notes (Signed)
Inpatient Diabetes Program Recommendations  AACE/ADA: New Consensus Statement on Inpatient Glycemic Control (2015)  Target Ranges:  Prepandial:   less than 140 mg/dL      Peak postprandial:   less than 180 mg/dL (1-2 hours)      Critically ill patients:  140 - 180 mg/dL   Lab Results  Component Value Date   GLUCAP 162 (H) 11/16/2018   HGBA1C 10.9 (H) 11/11/2018    Review of Glycemic Control  Orders: Lantus 9 units QD Novolog 0-15 units tidwc and hs  Stopped by to see pt per RN request. Pt was in good spirits. He said he was definitely going to be taking better care of himself when discharged. 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 Pt understands importance of checking blood sugars and taking insulin as prescribed. Will need to f/u with PCP for his diabetes management. Case manager consulted for assistance.   CBGs today - 244, 336, 162. (No Lantus on 3/16)  Will follow.  Thank you. Lorenda Peck, RD, LDN, CDE Inpatient Diabetes Coordinator [phone]

## 2018-11-16 NOTE — Progress Notes (Signed)
CARDIAC REHAB PHASE I   PRE:  Rate/Rhythm: 64 BBB  BP:  Sitting: 123/72      SaO2: 98 RA  MODE:  Ambulation: 500 ft   POST:  Rate/Rhythm: 71 BBB  BP:  Sitting: 136/80    SaO2: 97 RA   Pt ambulated 555ft in hallway standby assist with steady gait. Pt denies CP or SOB. Pt educated on importance of ASA, Brilinta, and NTG. Pt given HF booklet, heart healthy and diabetic diets. Reviewed restrictions and exercise guidelines. Will refer to CRP II [place], pt not interested in attending.  [id number] Rufina Falco, RN BSN 11/16/2018 9:46 AM

## 2018-11-16 NOTE — Progress Notes (Signed)
Progress Note  Patient Name: Earl Calderon Date of Encounter: 11/16/2018  Primary Cardiologist: Elouise Munroe, MD   Subjective   Feeling well.  No chest pain.  Breathing improving.  Denies palpitations, lightheadedness or dizziness.   Inpatient Medications    Scheduled Meds: . aspirin  81 mg Oral Daily  . carvedilol  6.25 mg Oral BID WC  . enoxaparin (LOVENOX) injection  40 mg Subcutaneous Q24H  . furosemide  40 mg Intravenous Q12H  . insulin aspart  0-15 Units Subcutaneous TID WC  . insulin glargine  9 Units Subcutaneous Daily  . losartan  25 mg Oral Daily  . nitroGLYCERIN  1 inch Topical Q6H  . pantoprazole  40 mg Oral BID  . sodium chloride flush  3 mL Intravenous Q12H  . spironolactone  25 mg Oral Daily  . ticagrelor  90 mg Oral BID   Continuous Infusions: . sodium chloride     PRN Meds: sodium chloride, acetaminophen, ondansetron (ZOFRAN) IV, sodium chloride flush   Vital Signs    Vitals:   11/15/18 2223 11/15/18 2253 11/16/18 0616 11/16/18 0818  BP: (!) 168/91 (!) 170/98 (!) 141/78 123/72  Pulse: 64 64 64 63  Resp: 15 15 (!) 23 15  Temp:   98.1 F (36.7 C) 97.8 F (36.6 C)  TempSrc:   Oral Oral  SpO2: 95% 100% 97% 98%  Weight:   66 kg   Height:        Intake/Output Summary (Last 24 hours) at 11/16/2018 0920 Last data filed at 11/16/2018 0820 Gross per 24 hour  Intake 1283.33 ml  Output 2950 ml  Net -1666.67 ml   Last 3 Weights 11/16/2018 11/15/2018 11/14/2018  Weight (lbs) 145 lb 8.1 oz 144 lb 148 lb 8 oz  Weight (kg) 66 kg 65.318 kg 67.359 kg      Telemetry    Sinus rhythm. Runs of NSVT >20 beats - Personally Reviewed  ECG    Sinus rhythm.  Rate 67 bpm.  LBBB.  - Personally Reviewed  Physical Exam   VS:  BP 123/72 (BP Location: Right Arm)   Pulse 63   Temp 97.8 F (36.6 C) (Oral)   Resp 15   Ht 5\' 10"  (1.778 m)   Wt 66 kg   SpO2 98%   BMI 20.88 kg/m  , BMI Body mass index is 20.88 kg/m. GENERAL:  Well appearing HEENT:  Pupils equal round and reactive, fundi not visualized, oral mucosa unremarkable NECK:  No jugular venous distention, waveform within normal limits, carotid upstroke brisk and symmetric, no bruits LUNGS:  Clear to auscultation bilaterally HEART:  RRR.  PMI not displaced or sustained,S1 and S2 within normal limits, no S3, no S4, no clicks, no rubs, no murmurs ABD:  Flat, positive bowel sounds normal in frequency in pitch, no bruits, no rebound, no guarding, no midline pulsatile mass, no hepatomegaly, no splenomegaly EXT:  2 plus pulses throughout, no edema, no cyanosis no clubbing SKIN:  No rashes no nodules NEURO:  Cranial nerves II through XII grossly intact, motor grossly intact throughout [facility]:  Cognitively intact, oriented to person place and time   Labs    Chemistry Recent Labs  Lab 11/11/18 1515 11/12/18 0041  11/14/18 0525 11/15/18 0522 11/15/18 1408 11/15/18 1414 11/16/18 0358  NA 135 138   < > 143 140 141 141 136  K 3.8 3.5   < > 3.9 4.5 4.0 4.1 3.7  CL 101 98   < > 102 102  --   --  100  CO2 29 34*   < > 31 32  --   --  29  GLUCOSE 423* 407*   < > 164* 187*  --   --  324*  BUN 14 16   < > 23 21  --   --  21  CREATININE 0.70 0.81   < > 0.94 0.83  --   --  0.97  CALCIUM 8.5* 8.5*   < > 8.6* 8.7*  --   --  8.3*  PROT 7.0 6.7  --   --   --   --   --   --   ALBUMIN 3.1* 2.9*  --   --   --   --   --   --   AST 28 25  --   --   --   --   --   --   ALT 47* 41  --   --   --   --   --   --   ALKPHOS 158* 163*  --   --   --   --   --   --   BILITOT 1.3* 1.1  --   --   --   --   --   --   GFRNONAA >60 >60   < > >60 >60  --   --  >60  GFRAA >60 >60   < > >60 >60  --   --  >60  ANIONGAP 5 6   < > 10 6  --   --  7   < > = values in this interval not displayed.     Hematology Recent Labs  Lab 11/13/18 0526 11/15/18 0522 11/15/18 1408 11/15/18 1414 11/16/18 0358  WBC 4.7 4.9  --   --  4.7  RBC 3.81* 4.28  --   --  3.79*  HGB 11.6* 13.2 13.3 12.9* 11.7*  HCT 36.2* 40.1  39.0 38.0* 34.4*  MCV 95.0 93.7  --   --  90.8  MCH 30.4 30.8  --   --  30.9  MCHC 32.0 32.9  --   --  34.0  RDW 12.3 12.2  --   --  11.8  PLT 190 207  --   --  199    Cardiac Enzymes Recent Labs  Lab 11/11/18 1515 11/11/18 2350 11/12/18 0048 11/12/18 0547  TROPONINI <0.03 <0.03 0.03* 0.03*   No results for input(s): TROPIPOC in the last 168 hours.   BNP Recent Labs  Lab 11/11/18 1515  BNP 746.4*     DDimer No results for input(s): DDIMER in the last 168 hours.   Radiology    No results found.  Cardiac Studies   LHC 11/15/18:  Prox LAD to Mid LAD lesion is 40% stenosed.  Mid Cx to Dist Cx lesion is 25% stenosed.  Ost 2nd Diag to 2nd Diag lesion is 50% stenosed.  Ost 3rd Mrg lesion is 25% stenosed.  Mid LAD lesion is 90% stenosed.  A drug-eluting stent was successfully placed using a STENT SYNERGY DES 2.5X16.  Post intervention, there is a 0% residual stenosis.  LV end diastolic pressure is normal.  There is no aortic valve stenosis.  Ao 92%, PA 66%, mean PA 17 mm Hg, mean PCWP 3 mm Hg, CO 5.1; CI 2.8- normal right heart pressures  Tortuous right subclavian. 3DRC was used for RCA.   Continue aggressive secondary prevention in addition to dual antiplatelet therapy.   Adequate diuresis based  on right heart pressures.  Continue medical therapy for heart failure.  Echo 11/12/18:  IMPRESSIONS  1. The left ventricle has severely reduced systolic function, with an ejection fraction of 25-30%. The cavity size was normal. There is moderately increased left ventricular wall thickness. Cardiac amyloidosis is a consideration based on the appearance  of the LV myocardium. Left ventricular diastolic Doppler parameters are consistent with impaired relaxation. Left ventricular diffuse hypokinesis.  2. The right ventricle has mildly reduced systolic function. The cavity was normal. There is mildly increased right ventricular wall thickness.  3. Left atrial size was  severely dilated.  4. Right atrial size was mildly dilated.  5. Moderate pleural effusion in the left lateral region.  6. Trivial pericardial effusion is present.  7. No evidence of mitral valve stenosis. Trivial mitral regurgitation.  8. The aortic valve is tricuspid Mild calcification of the aortic valve. no stenosis of the aortic valve.  9. The aortic root and ascending aorta are normal in size and structure. 10. The inferior vena cava was normal in size with <50% respiratory variability. PA systolic pressure 41 mmHg.   Patient Profile     Mr. Dresch is a 36M with hypertension and diabetes admitted with newly diagnosed acute systolic and diastolic heart failure.  Assessment & Plan    # Acute systolic and diastolic heart failure: LVEF 25-30%.  Newly diagnosed this admission.  He is now diuresed and euvolemic.  RA pressure 0 on cath.  Will stop lasix.  Renal function stable.  Plan to start lasix 20mg  po daily tomorrow.  He will need a BMP in 1 week.  Switch losartan to Entresto 24/26mg  bid.  Given his NSVT will increase carvedilol to 12.5mg  bid.  Continue spironolactone.  He will need a repeat echo in 3 months.   # NSVT: Up to 21 beat runs of NSVT.  Patient was asymptomatic.  LVEF 25-30%.  Life Vest and increase carvedilol as above.  # CAD:  90% mid LAD lesion treated with DES this admission.  Continue DAPT 1 year.  Will add on lipid panel.  Start rosuvastatin 40mg  qhs.  Check lipids/CMP in 6-8 weeks.       For questions or updates, please contact [place] Please consult [email or web address] for contact info under        Signed, [name], MD  11/16/2018, 9:20 AM

## 2018-11-16 NOTE — TOC Initial Note (Addendum)
Transition of Care [facility]) - Initial/Assessment Note    Patient Details  Name: Earl Calderon MRN: [id number] Date of Birth: 10-Oct-1950  Transition of Care [facility]) CM/SW Contact:    Zenon Mayo, RN Phone Number: 11/16/2018, 8:59 AM  Clinical Narrative:                 Patient is from home with wife, she is in the Health field and takes care of him.  He uses a cane at home.  He is s/p stent intervention, will be on brilinta, he has Medicaid, and states he has never used it before.  He states he does not have a PCP but there is a MD name on his Medicaid card and he will make a apt to see him, he is not sure of the name right now, but he and his wife will take care of it. Also NCM asked if he would like HHRN to help with disease management with CHF, he states no his wife can do that. Per Nere RN , patient will also be on Entresto for CHF which is a preferred medication for medicaid.Albin Felling RN will give patient the 30 day free coupon for brilinta and entresto.  Will see if [facility] pharmacy can fill the 30 day free for patient before discharge.  NCM checked with financial counseling and they states he has regular Medicare and regular Medicaid. Patient will need Zoll vest, NCM faxed information to [place].   Expected Discharge Plan: Home/Self Care Barriers to Discharge: No Barriers Identified   Patient Goals and CMS Choice Patient states their goals for this hospitalization and ongoing recovery are:: go home and weigh himself daily, and start taking better care of himself    Choice offered to / list presented to : NA  Expected Discharge Plan and Services Expected Discharge Plan: Home/Self Care Discharge Planning Services: Medication Assistance Post Acute Care Choice: NA Living arrangements for the past 2 months: Single Family Home                          Prior Living Arrangements/Services Living arrangements for the past 2 months: Single Family Home Lives with:: Spouse   Do you  feel safe going back to the place where you live?: Yes      Need for Family Participation in Patient Care: No (Comment) Care giver support system in place?: Yes (comment)(wife works in health system and helps take care of him)   Criminal Activity/Legal Involvement Pertinent to Current Situation/Hospitalization: No - Comment as needed  Activities of Daily Living Home Assistive Devices/Equipment: Cane (specify quad or straight) ADL Screening (condition at time of admission) Patient's cognitive ability adequate to safely complete daily activities?: Yes Is the patient deaf or have difficulty hearing?: No Does the patient have difficulty seeing, even when wearing glasses/contacts?: No Does the patient have difficulty concentrating, remembering, or making decisions?: No Patient able to express need for assistance with ADLs?: Yes Does the patient have difficulty dressing or bathing?: No Independently performs ADLs?: Yes (appropriate for developmental age) Does the patient have difficulty walking or climbing stairs?: No Weakness of Legs: Both Weakness of Arms/Hands: None  Permission Sought/Granted                  Emotional Assessment   Attitude/Demeanor/Rapport: Engaged Affect (typically observed): Appropriate Orientation: : Oriented to Self, Oriented to Place, Oriented to  Time, Oriented to Situation   Psych Involvement: No (comment)  Admission  diagnosis:  Hypertensive urgency [I16.0] Type 2 diabetes mellitus with other specified complication, without long-term current use of insulin (HCC) [E11.69] Acute congestive heart failure, unspecified heart failure type [facility]) [I50.9] Patient Active Problem List   Diagnosis Date Noted  . Hypertensive urgency   . LBBB (left bundle branch block) 11/13/2018  . Acute combined systolic (congestive) and diastolic (congestive) heart failure ([place]) 11/11/2018  . Hypertension 11/11/2018  . Uncontrolled type 2 diabetes mellitus ([place]) 11/11/2018  .  Hypomagnesemia 11/11/2018  . Hyperbilirubinemia 11/11/2018  . Abnormal LFTs 11/11/2018   PCP:  System, Pcp Not In Pharmacy:   [place] [place]  [address] Phone: [phone] Fax: [phone]     Social Determinants of Health (SDOH) Interventions    Readmission Risk Interventions 30 Day Unplanned Readmission Risk Score     ED to Hosp-Admission (Current) from 11/11/2018 in [place] CATH RECOVERY  30 Day Unplanned Readmission Risk Score (%)  12 Filed at 11/16/2018 0801     This score is the patient's risk of an unplanned readmission within 30 days of being discharged (0 -100%). The score is based on dignosis, age, lab data, medications, orders, and past utilization.   Low:  0-14.9   Medium: 15-21.9   High: 22-29.9   Extreme: 30 and above       No flowsheet data found.

## 2018-11-16 NOTE — Progress Notes (Signed)
Called by nursing with 21-beat run of [place]. Patient was asymptomatic, blood pressure normal. Ordered Mg level. Continue to monitor.   Tami Lin Duke, PA-C 11/16/2018, 9:19 AM

## 2018-11-16 NOTE — Progress Notes (Signed)
PROGRESS NOTE    Earl Calderon  XTK:[id number] DOB: December 14, 1950 DOA: 11/11/2018 PCP: System, Pcp Not In     Brief Narrative:  Earl Calderon is a 68 y.o.malewith medical history significant oftype 2 diabetes, hypertension, inguinal hernia who has not seen a physician for the past 10 years who presented with shortness of breath, worsening lower extremity edema.  Echocardiogram revealed EF 25 to 30%, cardiology consulted, patient diuresed.  He underwent heart cath on 3/16 with DES placement.  New events last 24 hours / Subjective: Feeling well this morning, denies any chest pain or shortness of breath.  No nausea or vomiting.  Eating breakfast without any issues today.  Assessment & Plan:   Principal Problem:   Acute combined systolic (congestive) and diastolic (congestive) heart failure (HCC) Active Problems:   Hypertension   Uncontrolled type 2 diabetes mellitus (HCC)   Hypomagnesemia   Hyperbilirubinemia   Abnormal LFTs   LBBB (left bundle branch block)   Hypertensive urgency   Acute combined systolic and diastolic heart failure, new onset EF 25 to 30% Cardiology following Continue Entresto, Coreg, Aldactone, Lasix changed to p.o. starting tomorrow Strict I's and O's  Essential hypertension Medications as above  CAD Status post heart cath 3/16, status post DES Continue aspirin, Brilinta for 1 year Crestor  Type 2 diabetes, uncontrolled with hyperglycemia Appreciate diabetic coordinator Lantus, NovoLog, sliding scale insulin  Hypomagnesemia Replace, trend  GERD Continue Protonix   DVT prophylaxis: Lovenox Code Status: Full code Family Communication: No family at bedside Disposition Plan: Further cardiology recommendations   Consultants:   Cardiology  Procedures:   Heart cath 3/16  Antimicrobials:  Anti-infectives (From admission, onward)   None        Objective: Vitals:   11/15/18 2223 11/15/18 2253 11/16/18 0616 11/16/18 0818  BP:  (!) 168/91 (!) 170/98 (!) 141/78 123/72  Pulse: 64 64 64 63  Resp: 15 15 (!) 23 15  Temp:   98.1 F (36.7 C) 97.8 F (36.6 C)  TempSrc:   Oral Oral  SpO2: 95% 100% 97% 98%  Weight:   66 kg   Height:        Intake/Output Summary (Last 24 hours) at 11/16/2018 1025 Last data filed at 11/16/2018 0820 Gross per 24 hour  Intake 1283.33 ml  Output 2950 ml  Net -1666.67 ml   Filed Weights   11/14/18 0640 11/15/18 0457 11/16/18 0616  Weight: 67.4 kg 65.3 kg 66 kg    Examination:  General exam: Appears calm and comfortable  Respiratory system: Clear to auscultation. Respiratory effort normal. Cardiovascular system: S1 & S2 heard, RRR. No JVD, murmurs, rubs, gallops or clicks. No pedal edema. Gastrointestinal system: Abdomen is nondistended, soft and nontender. No organomegaly or masses felt. Normal bowel sounds heard. Central nervous system: Alert and oriented. No focal neurological deficits. Extremities: Symmetric 5 x 5 power. Skin: No rashes, lesions or ulcers Psychiatry: Judgement and insight appear normal. Mood & affect appropriate.   Data Reviewed: I have personally reviewed following labs and imaging studies  CBC: Recent Labs  Lab 11/11/18 1515 11/12/18 0041 11/13/18 0526 11/15/18 0522 11/15/18 1408 11/15/18 1414 11/16/18 0358  WBC 5.1 5.0 4.7 4.9  --   --  4.7  NEUTROABS 3.7 3.6  --   --   --   --   --   HGB 13.3 13.0 11.6* 13.2 13.3 12.9* 11.7*  HCT 40.0 40.9 36.2* 40.1 39.0 38.0* 34.4*  MCV 92.0 94.5 95.0 93.7  --   --  90.8  PLT 168 189 190 207  --   --  001   Basic Metabolic Panel: Recent Labs  Lab 11/11/18 1515 11/12/18 0041 11/13/18 0526 11/14/18 0525 11/15/18 0522 11/15/18 1408 11/15/18 1414 11/16/18 0358  NA 135 138 140 143 140 141 141 136  K 3.8 3.5 3.3* 3.9 4.5 4.0 4.1 3.7  CL 101 98 102 102 102  --   --  100  CO2 29 34* 31 31 32  --   --  29  GLUCOSE 423* 407* 176* 164* 187*  --   --  324*  BUN 14 16 21 23 21   --   --  21  CREATININE 0.70  0.81 0.86 0.94 0.83  --   --  0.97  CALCIUM 8.5* 8.5* 8.2* 8.6* 8.7*  --   --  8.3*  MG 1.5*  --   --  1.6*  --   --   --  1.5*  PHOS 2.7  --   --   --   --   --   --   --    GFR: Estimated Creatinine Clearance: 70.9 mL/min (by C-G formula based on SCr of 0.97 mg/dL). Liver Function Tests: Recent Labs  Lab 11/11/18 1515 11/12/18 0041  AST 28 25  ALT 47* 41  ALKPHOS 158* 163*  BILITOT 1.3* 1.1  PROT 7.0 6.7  ALBUMIN 3.1* 2.9*   Recent Labs  Lab 11/11/18 1515  LIPASE 25   No results for input(s): AMMONIA in the last 168 hours. Coagulation Profile: Recent Labs  Lab 11/11/18 1515  INR 1.2   Cardiac Enzymes: Recent Labs  Lab 11/11/18 1515 11/11/18 2350 11/12/18 0048 11/12/18 0547  TROPONINI <0.03 <0.03 0.03* 0.03*   BNP (last 3 results) No results for input(s): PROBNP in the last 8760 hours. HbA1C: No results for input(s): HGBA1C in the last 72 hours. CBG: Recent Labs  Lab 11/15/18 0739 11/15/18 1159 11/15/18 1623 11/15/18 2123 11/16/18 0617  GLUCAP 159* 155* 147* 302* 244*   Lipid Profile: No results for input(s): CHOL, HDL, LDLCALC, TRIG, CHOLHDL, LDLDIRECT in the last 72 hours. Thyroid Function Tests: No results for input(s): TSH, T4TOTAL, FREET4, T3FREE, THYROIDAB in the last 72 hours. Anemia Panel: No results for input(s): VITAMINB12, FOLATE, FERRITIN, TIBC, IRON, RETICCTPCT in the last 72 hours. Sepsis Labs: Recent Labs  Lab 11/11/18 1636 11/12/18 0041  LATICACIDVEN 1.3 1.0    Recent Results (from the past 240 hour(s))  Urine culture     Status: None   Collection Time: 11/11/18  3:15 PM  Result Value Ref Range Status   Specimen Description   Final    URINE, CLEAN CATCH Performed at [facility], [address] [place], [place] [address]    Special Requests   Final    NONE Performed at [facility], [address] [place], [place] [address]    Culture   Final    NO GROWTH Performed at [facility],  [place] [address] [place], [place] [address]    Report Status 11/12/2018 FINAL  Final       Radiology Studies: No results found.    Scheduled Meds: . aspirin  81 mg Oral Daily  . carvedilol  12.5 mg Oral BID WC  . enoxaparin (LOVENOX) injection  40 mg Subcutaneous Q24H  . insulin aspart  0-15 Units Subcutaneous TID WC  . insulin aspart  0-5 Units Subcutaneous QHS  . insulin glargine  9  Units Subcutaneous Daily  . nitroGLYCERIN  1 inch Topical Q6H  . pantoprazole  40 mg Oral BID  . rosuvastatin  40 mg Oral q1800  . sacubitril-valsartan  1 tablet Oral BID  . sodium chloride flush  3 mL Intravenous Q12H  . spironolactone  25 mg Oral Daily  . ticagrelor  90 mg Oral BID   Continuous Infusions: . sodium chloride       LOS: 5 days    Time spent: 35 minutes   Dessa Phi, DO [facility] [email or web address] 11/16/2018, 10:25 AM

## 2018-11-16 NOTE — Progress Notes (Signed)
Inpatient Diabetes Program Recommendations  AACE/ADA: New Consensus Statement on Inpatient Glycemic Control   Target Ranges:  Prepandial:   less than 140 mg/dL      Peak postprandial:   less than 180 mg/dL (1-2 hours)      Critically ill patients:  140 - 180 mg/dL   Results for Earl Calderon, Earl Calderon (MRN [id number]) as of 11/16/2018 10:16  Ref. Range 11/15/2018 07:39 11/15/2018 11:59 11/15/2018 16:23 11/15/2018 21:23 11/16/2018 06:17  Glucose-Capillary Latest Ref Range: 70 - 99 mg/dL 159 (H) 155 (H) 147 (H) 302 (H) 244 (H)   Review of Glycemic Control  Diabetes history: DM2 Outpatient Diabetes medications: None Current orders for Inpatient glycemic control: Lantus 9 units daily, Novolog 0-15 units TID with meals  Inpatient Diabetes Program Recommendations:  Insulin - Basal: In reviewing chart, noted patient did NOT receive Lantus on 11/15/18. As a result glucose up to 302 mg/dl last night and fasting glucose 244 mg/dl this morning. Would not recommend any changes with basal insulin at this time. RNs, please administer insulin as ordered unless MD order given to hold.  Insulin-Correction: Please consider ordering Novolog 0-5 units QHS.  Thanks, Barnie Alderman, RN, MSN, CDE Diabetes Coordinator Inpatient Diabetes Program [phone] (Team Pager from 8am to 5pm)

## 2018-11-17 DIAGNOSIS — Z955 Presence of coronary angioplasty implant and graft: Secondary | ICD-10-CM

## 2018-11-17 LAB — HEPATIC FUNCTION PANEL
ALK PHOS: 119 U/L (ref 38–126)
ALT: 25 U/L (ref 0–44)
AST: 28 U/L (ref 15–41)
Albumin: 2.9 g/dL — ABNORMAL LOW (ref 3.5–5.0)
BILIRUBIN TOTAL: 1.4 mg/dL — AB (ref 0.3–1.2)
Bilirubin, Direct: 0.3 mg/dL — ABNORMAL HIGH (ref 0.0–0.2)
Indirect Bilirubin: 1.1 mg/dL — ABNORMAL HIGH (ref 0.3–0.9)
Total Protein: 6.8 g/dL (ref 6.5–8.1)

## 2018-11-17 LAB — BASIC METABOLIC PANEL
Anion gap: 5 (ref 5–15)
BUN: 22 mg/dL (ref 8–23)
CO2: 33 mmol/L — ABNORMAL HIGH (ref 22–32)
Calcium: 8.6 mg/dL — ABNORMAL LOW (ref 8.9–10.3)
Chloride: 99 mmol/L (ref 98–111)
Creatinine, Ser: 1.15 mg/dL (ref 0.61–1.24)
GFR calc Af Amer: >60 mL/min (ref >60)
GFR calc non Af Amer: >60 mL/min (ref >60)
GLUCOSE: 234 mg/dL — AB (ref 70–99)
POTASSIUM: 3.8 mmol/L (ref 3.5–5.1)
Sodium: 137 mmol/L (ref 135–145)

## 2018-11-17 LAB — MAGNESIUM: Magnesium: 1.9 mg/dL (ref 1.7–2.4)

## 2018-11-17 LAB — GLUCOSE, CAPILLARY
Glucose-Capillary: 202 mg/dL — ABNORMAL HIGH (ref 70–99)
Glucose-Capillary: 301 mg/dL — ABNORMAL HIGH (ref 70–99)
Glucose-Capillary: 72 mg/dL (ref 70–99)

## 2018-11-17 MED ORDER — CARVEDILOL 12.5 MG PO TABS: 12.5000 mg | ORAL_TABLET | Freq: Two times a day (BID) | ORAL | 0 refills | Status: DC

## 2018-11-17 MED ORDER — "PEN NEEDLES 1/2"" 29G X 12MM MISC": 0 refills | Status: DC

## 2018-11-17 MED ORDER — INSULIN GLARGINE 100 UNITS/ML SOLOSTAR PEN: 8.0000 [IU] | PEN_INJECTOR | Freq: Two times a day (BID) | SUBCUTANEOUS | 0 refills | Status: DC

## 2018-11-17 MED ORDER — BLOOD GLUCOSE MONITOR KIT: PACK | 0 refills | Status: DC

## 2018-11-17 MED ORDER — ROSUVASTATIN CALCIUM 20 MG PO TABS
20.0000 mg | ORAL_TABLET | Freq: Every day | ORAL | Status: DC
  Administered 2018-11-17: 17:00:00 20 mg via ORAL
  Filled 2018-11-17: qty 1

## 2018-11-17 MED ORDER — FUROSEMIDE 20 MG PO TABS: 20.0000 mg | ORAL_TABLET | Freq: Every day | ORAL | 0 refills | Status: DC

## 2018-11-17 MED ORDER — INSULIN ASPART 100 UNIT/ML FLEXPEN: PEN_INJECTOR | SUBCUTANEOUS | 0 refills | Status: DC

## 2018-11-17 MED ORDER — SPIRONOLACTONE 25 MG PO TABS: 25.0000 mg | ORAL_TABLET | Freq: Every day | ORAL | 0 refills | Status: DC

## 2018-11-17 MED ORDER — FUROSEMIDE 20 MG PO TABS: 20.0000 mg | ORAL_TABLET | Freq: Every day | ORAL | Status: DC

## 2018-11-17 MED ORDER — TICAGRELOR 90 MG PO TABS: 90.0000 mg | ORAL_TABLET | Freq: Two times a day (BID) | ORAL | 0 refills | Status: DC

## 2018-11-17 MED ORDER — SACUBITRIL-VALSARTAN 24-26 MG PO TABS: 1.0000 | ORAL_TABLET | Freq: Two times a day (BID) | ORAL | 0 refills | Status: DC

## 2018-11-17 MED ORDER — ASPIRIN 81 MG PO CHEW: 81.0000 mg | CHEWABLE_TABLET | Freq: Every day | ORAL | 0 refills | Status: AC

## 2018-11-17 MED ORDER — INSULIN DETEMIR 100 UNIT/ML FLEXPEN: 8.0000 [IU] | Freq: Two times a day (BID) | SUBCUTANEOUS | 0 refills | Status: DC

## 2018-11-17 MED ORDER — ROSUVASTATIN CALCIUM 20 MG PO TABS: 20.0000 mg | ORAL_TABLET | Freq: Every day | ORAL | 0 refills | Status: DC

## 2018-11-17 MED FILL — CARVEDILOL 12.5 MG TABLET: 12.5 | 30 days supply | Qty: 60 | Fill #0

## 2018-11-17 MED FILL — LEVEMIR FLEXTOUCH 100 UNITS: 100 | 30 days supply | Qty: 6 | Fill #0

## 2018-11-17 MED FILL — ROSUVASTATIN CALCIUM 20 MG: 20 | 30 days supply | Qty: 30 | Fill #0

## 2018-11-17 MED FILL — BRILINTA 90 MG TABLET: 90 | 30 days supply | Qty: 60 | Fill #0

## 2018-11-17 MED FILL — SPIRONOLACTONE 25 MG TABLET: 25 | 30 days supply | Qty: 30 | Fill #0

## 2018-11-17 MED FILL — ASPIRIN LOW DOSE 81 MG CHEW: 81 | 30 days supply | Qty: 30 | Fill #0

## 2018-11-17 MED FILL — NOVOLOG FLEXPEN SYRINGE: 100 | 30 days supply | Qty: 9 | Fill #0

## 2018-11-17 MED FILL — PENTIPS 32G X 4 MM MISC: 32G X 4 MM | 30 days supply | Qty: 100 | Fill #0

## 2018-11-17 MED FILL — ENTRESTO 24 MG-26 MG TABLET: 24-26 | 30 days supply | Qty: 60 | Fill #0

## 2018-11-17 MED FILL — FUROSEMIDE 20 MG TAB: 20 | 30 days supply | Qty: 30 | Fill #0

## 2018-11-17 NOTE — Discharge Summary (Addendum)
Discharge Summary  Earl Calderon XJO:[id number] DOB: Jan 16, 1951  PCP: System, Pcp Not In  Admit date: 11/11/2018 Discharge date: 11/17/2018  Time spent: 77mns, more than 50% time spent on coordination of care.  Recommendations for Outpatient Follow-up:  1. F/u with PCP within a week  for hospital discharge follow up, repeat cbc/bmp at follow up. pcp to monitor blood glucose management, he is newly started on insulin,  2. F/u with cardiology, cardiology to close follow up with patient, he is sent home on life vest. Cardiology to decide on SGLT2 inhibitor on outpatient basis. 3. All meds delivered to his room prior to discharge. (all meds are new, he was not previously on any meds)  Discharge Diagnoses:  Active Hospital Problems   Diagnosis Date Noted  . Acute combined systolic (congestive) and diastolic (congestive) heart failure ([facility] 11/11/2018  . Status post coronary artery stent placement   . Hypertensive urgency   . LBBB (left bundle branch block) 11/13/2018  . Hypertension 11/11/2018  . Uncontrolled type 2 diabetes mellitus ([facility] 11/11/2018  . Hypomagnesemia 11/11/2018  . Hyperbilirubinemia 11/11/2018  . Abnormal LFTs 11/11/2018    Resolved Hospital Problems  No resolved problems to display.    Discharge Condition: stable  Diet recommendation: heart healthy/carb modified  Filed Weights   11/16/18 0616 11/17/18 0500 11/17/18 0622  Weight: 66 kg 63.9 kg 63.9 kg    History of present illness: (per admitting MD Dr OOlevia Bowens Patient coming from: Home.  I have personally briefly reviewed patient's old medical records [facility] Chief Complaint: Shortness of breath and swelling.  HPI: Earl Calderon a 68y.o. male with medical history significant of type 2 diabetes, hypertension, inguinal hernia who has not seeing a physician for and has been treating himself with herbal medicines for the past 10 years and more recently with metformin who is coming to  the emergency department with complaints of hematemesis for the last 4 weeks, associated with progressively worse lower extremity edema and dyspnea.  He also complains of orthopnea and PND.  He denies fever, chills, but complains of fatigue and decreased appetite.  He denies chest pain, palpitations, diaphoresis, abdominal pain, melena or hematochezia.  No dysuria, frequency or hematuria.  He denies polyuria, polydipsia, polyphagia or blurred vision.  ED Course: Initial vital signs temperature 98.1 F, pulse 77, respirations 20, blood pressure 178/105 mmHg and O2 sat 96% on room air.  The patient received supplemental oxygen and 40 mg of furosemide IVP in the ED.  White count 5.1, hemoglobin 13.3 g/dL and platelets 168.  PT/INR were normal.  Initial troponin was normal.  Lipase within expected limits.  CMP shows normal electrolytes when calcium is corrected to albumin.  Renal function is normal.  Total protein was 7.0 and albumin 3.1 g/dL.  AST 28, ALT was 47, alkaline phosphatase 150 a and total bilirubin 1.3 mg/dL.  EKG shows sinus rhythm with probable left atrial enlargement with level and the branch block.  Chest radiograph shows bilateral pleural effusions and likely underlying atelectasis/infiltrate.  Please see images and radiology report for further detail.  Hospital Course:  Principal Problem:   Acute combined systolic (congestive) and diastolic (congestive) heart failure (HCC) Active Problems:   Hypertension   Uncontrolled type 2 diabetes mellitus (HCC)   Hypomagnesemia   Hyperbilirubinemia   Abnormal LFTs   LBBB (left bundle branch block)   Hypertensive urgency   Status post coronary artery stent placement   Acute combined systolic and diastolic heart failure,  new onset EF 25 to 30% He has improved, he is started on Entresto, Coreg, Aldactone, Lasix per cardiology recommendations.  Life vest/cardiac rehab/followup/meds refills/prior authorization per cardiology  CAD Status  post heart cath 3/16, status post DES Continue aspirin, Brilinta for 1 year Cresto Follow up with cardiology  Essential hypertension Stable on Medications as above   Type 2 diabetes, uncontrolled with hyperglycemia Appreciate diabetic coordinator Levemir , NovoLog, sliding scale insulin Cardiology is considering SGLT2 inhibitor , patient is to follow up with pcp and cardiology  Hypomagnesemia Replaced and improved  GERD Continue Protonix    Code Status: Full code Family Communication: No family at bedside Disposition Plan: he is cleared by cardiology to discharge home   Consultants:   Cardiology  Procedures:   Heart cath 3/16  Discharge Exam: BP (!) 156/84 (BP Location: Right Arm)   Pulse 61   Temp 98.6 F (37 C) (Oral)   Resp 12   Ht 5' 10"  (1.778 m)   Wt 63.9 kg   SpO2 95%   BMI 20.22 kg/m   General: NAD Cardiovascular: RRR Respiratory: CTABL  Discharge Instructions You were cared for by a hospitalist during your hospital stay. If you have any questions about your discharge medications or the care you received while you were in the hospital after you are discharged, you can call the unit and asked to speak with the hospitalist on call if the hospitalist that took care of you is not available. Once you are discharged, your primary care physician will handle any further medical issues. Please note that NO REFILLS for any discharge medications will be authorized once you are discharged, as it is imperative that you return to your primary care physician (or establish a relationship with a primary care physician if you do not have one) for your aftercare needs so that they can reassess your need for medications and monitor your lab values.  Discharge Instructions    Amb Referral to Cardiac Rehabilitation   Complete by:  As directed    Diagnosis:  Coronary Stents   Diet - low sodium heart healthy   Complete by:  As directed    Carb modified diet    Increase activity slowly   Complete by:  As directed      Allergies as of 11/17/2018   No Known Allergies     Medication List    TAKE these medications   aspirin 81 MG chewable tablet Chew 1 tablet (81 mg total) by mouth daily for 30 days. Start taking on:  November 18, 2018   blood glucose meter kit and supplies Kit Dispense based on patient and insurance preference. Use up to four times daily as directed. (FOR ICD-9 250.00, 250.01).   carvedilol 12.5 MG tablet Commonly known as:  COREG Take 1 tablet (12.5 mg total) by mouth 2 (two) times daily with a meal.   furosemide 20 MG tablet Commonly known as:  LASIX Take 1 tablet (20 mg total) by mouth daily. Start taking on:  November 18, 2018   insulin aspart 100 UNIT/ML FlexPen Commonly known as:  NovoLOG FlexPen Before each meal 3 times a day, 140-199 - 2 units, 200-250 - 4 units, 251-299 - 6 units,  300-349 - 8 units,  350 or above 10 units. Insulin PEN if approved, provide syringes and needles if needed.   insulin detemir 100 unit/ml Soln Commonly known as:  LEVEMIR Inject 0.08 mLs (8 Units total) into the skin 2 (two) times daily.  PEN NEEDLES 29GX1/2" 29G X 12MM Misc Diabetes pen needles for a month supply   rosuvastatin 20 MG tablet Commonly known as:  CRESTOR Take 1 tablet (20 mg total) by mouth daily at 6 PM.   sacubitril-valsartan 24-26 MG Commonly known as:  ENTRESTO Take 1 tablet by mouth 2 (two) times daily.   spironolactone 25 MG tablet Commonly known as:  ALDACTONE Take 1 tablet (25 mg total) by mouth daily. Start taking on:  November 18, 2018   ticagrelor 90 MG Tabs tablet Commonly known as:  BRILINTA Take 1 tablet (90 mg total) by mouth 2 (two) times daily.      No Known Allergies Follow-up Information    Elouise Munroe, MD Follow up in 1 week(s).   Specialty:  Cardiology Why:  for heart failure management, please ask your cardiologist for heart meds refills.  Contact information: [address] [place] [address] [phone]        follow up with primary care physician Follow up in 1 week(s).   Why:  for hospital discharge follow up, repeat cbc/bmp at follow up. pcp to monitor blood glucose control.            The results of significant diagnostics from this hospitalization (including imaging, microbiology, ancillary and laboratory) are listed below for reference.    Significant Diagnostic Studies: Dg Chest 2 View  Result Date: 11/11/2018 CLINICAL DATA:  Vomiting but for several weeks with shortness of breath EXAM: CHEST - 2 VIEW COMPARISON:  10/14/2005 FINDINGS: Cardiac shadow is enlarged. Bilateral pleural effusions and underlying atelectatic changes are noted. No acute bony abnormality is seen. IMPRESSION: Bilateral pleural effusions and likely underlying atelectasis/infiltrate. Electronically Signed   By: Inez Catalina M.D.   On: 11/11/2018 16:21   US Abdomen Limited Ruq  Result Date: 11/12/2018 CLINICAL DATA:  Abnormal liver function tests EXAM: ULTRASOUND ABDOMEN LIMITED RIGHT UPPER QUADRANT COMPARISON:  None. FINDINGS: Gallbladder: No gallstones or wall thickening visualized. No sonographic Murphy sign noted by sonographer. Common bile duct: Diameter: 3 mm. Liver: Hypervascular lesion in the left lobe liver measuring up to 2.1 cm. No evidence of underlying cirrhosis or other chronic liver disease. Portal vein is patent on color Doppler imaging with normal direction of blood flow towards the liver. Right pleural effusion. IMPRESSION: 1. Hyperechoic lesion in the left lobe measuring 2.1 cm, usually attributed to hemangioma although non specific. If chronic liver disease or metastatic disease, recommend MR. Otherwise, recommend 6 month follow up US. 2. Negative gallbladder. 3. Right pleural effusion. Electronically Signed   By: Monte Fantasia M.D.   On: 11/12/2018 08:42    Microbiology: Recent Results (from the past 240 hour(s))  Urine culture     Status:  None   Collection Time: 11/11/18  3:15 PM  Result Value Ref Range Status   Specimen Description   Final    URINE, CLEAN CATCH Performed at [facility], [address] [place], [place] [address]    Special Requests   Final    NONE Performed at [facility], [address] [place], [place] [address]    Culture   Final    NO GROWTH Performed at [facility], [place] [address], [place], [place] [address]    Report Status 11/12/2018 FINAL  Final     Labs: Basic Metabolic Panel: Recent Labs  Lab 11/11/18 1515  11/13/18 0526 11/14/18 0525 11/15/18 0522 11/15/18 1408 11/15/18 1414 11/16/18 0358  11/17/18 0123  NA 135   < > 140 143 140 141 141 136 137  K 3.8   < > 3.3* 3.9 4.5 4.0 4.1 3.7 3.8  CL 101   < > 102 102 102  --   --  100 99  CO2 29   < > 31 31 32  --   --  29 33*  GLUCOSE 423*   < > 176* 164* 187*  --   --  324* 234*  BUN 14   < > 21 23 21   --   --  21 22  CREATININE 0.70   < > 0.86 0.94 0.83  --   --  0.97 1.15  CALCIUM 8.5*   < > 8.2* 8.6* 8.7*  --   --  8.3* 8.6*  MG 1.5*  --   --  1.6*  --   --   --  1.5* 1.9  PHOS 2.7  --   --   --   --   --   --   --   --    < > = values in this interval not displayed.   Liver Function Tests: Recent Labs  Lab 11/11/18 1515 11/12/18 0041 11/17/18 0123  AST 28 25 28   ALT 47* 41 25  ALKPHOS 158* 163* 119  BILITOT 1.3* 1.1 1.4*  PROT 7.0 6.7 6.8  ALBUMIN 3.1* 2.9* 2.9*   Recent Labs  Lab 11/11/18 1515  LIPASE 25   No results for input(s): AMMONIA in the last 168 hours. CBC: Recent Labs  Lab 11/11/18 1515 11/12/18 0041 11/13/18 0526 11/15/18 0522 11/15/18 1408 11/15/18 1414 11/16/18 0358  WBC 5.1 5.0 4.7 4.9  --   --  4.7  NEUTROABS 3.7 3.6  --   --   --   --   --   HGB 13.3 13.0 11.6* 13.2 13.3 12.9* 11.7*  HCT 40.0 40.9 36.2* 40.1 39.0 38.0* 34.4*  MCV 92.0 94.5 95.0 93.7  --   --  90.8  PLT 168 189 190 207  --   --  199   Cardiac Enzymes: Recent Labs  Lab 11/11/18 1515  11/11/18 2350 11/12/18 0048 11/12/18 0547  TROPONINI <0.03 <0.03 0.03* 0.03*   BNP: BNP (last 3 results) Recent Labs    11/11/18 1515  BNP 746.4*    ProBNP (last 3 results) No results for input(s): PROBNP in the last 8760 hours.  CBG: Recent Labs  Lab 11/16/18 1207 11/16/18 1701 11/16/18 2100 11/17/18 0618 11/17/18 1314  GLUCAP 336* 162* 182* 202* 301*       Signed:  Florencia Reasons MD, PhD  [facility] 11/17/2018, 4:18 PM

## 2018-11-17 NOTE — Progress Notes (Signed)
Discharge instructions given to the pt and the wife, questions answered with understanding. Medications from the pharmacy handed to the wife and the prescriptions. Pt assisted with the NT per wheelchair upon discharge. Pt stable.

## 2018-11-17 NOTE — Progress Notes (Signed)
Inpatient Diabetes Program Recommendations  AACE/ADA: New Consensus Statement on Inpatient Glycemic Control  Target Ranges:  Prepandial:   less than 140 mg/dL      Peak postprandial:   less than 180 mg/dL (1-2 hours)      Critically ill patients:  140 - 180 mg/dL   Results for Earl Calderon, Earl Calderon (MRN [id number]) as of 11/17/2018 08:56  Ref. Range 11/16/2018 06:17 11/16/2018 12:07 11/16/2018 17:01 11/16/2018 21:00 11/17/2018 06:18  Glucose-Capillary Latest Ref Range: 70 - 99 mg/dL 244 (H) 336 (H) 162 (H) 182 (H) 202 (H)   Review of Glycemic Control Diabetes history: DM2 Outpatient Diabetes medications: None Current orders for Inpatient glycemic control: Lantus 9 units daily, Novolog 0-15 units TID with meals, Novolog 0-5 units QHS   Inpatient Diabetes Program Recommendations:  Insulin-Meal Coverage: If patient remains inpatient today, please consider ordering Novolog 3 units TID with meals for meal coverage if patient eats at least 50% of meals.  Thanks, Barnie Alderman, RN, MSN, CDE Diabetes Coordinator Inpatient Diabetes Program [phone] (Team Pager from 8am to 5pm)

## 2018-11-17 NOTE — Progress Notes (Addendum)
Progress Note  Patient Name: Earl Calderon Date of Encounter: 11/17/2018  Primary Cardiologist: Elouise Munroe, MD   Subjective   Feeling well.  No chest pain.  Breathing improving.  Denies palpitations, lightheadedness or dizziness.   Inpatient Medications    Scheduled Meds: . aspirin  81 mg Oral Daily  . carvedilol  12.5 mg Oral BID WC  . enoxaparin (LOVENOX) injection  40 mg Subcutaneous Q24H  . insulin aspart  0-15 Units Subcutaneous TID WC  . insulin aspart  0-5 Units Subcutaneous QHS  . insulin glargine  9 Units Subcutaneous Daily  . nitroGLYCERIN  1 inch Topical Q6H  . pantoprazole  40 mg Oral BID  . rosuvastatin  40 mg Oral q1800  . sacubitril-valsartan  1 tablet Oral BID  . sodium chloride flush  3 mL Intravenous Q12H  . spironolactone  25 mg Oral Daily  . ticagrelor  90 mg Oral BID   Continuous Infusions: . sodium chloride     PRN Meds: sodium chloride, acetaminophen, ondansetron (ZOFRAN) IV, sodium chloride flush   Vital Signs    Vitals:   11/16/18 1947 11/17/18 0413 11/17/18 0500 11/17/18 0622  BP: 135/69 128/72    Pulse: (!) 59     Resp: 17 16    Temp: 97.8 F (36.6 C) 98.3 F (36.8 C)    TempSrc: Oral Oral    SpO2: 97% 98%    Weight:   63.9 kg 63.9 kg  Height:        Intake/Output Summary (Last 24 hours) at 11/17/2018 0922 Last data filed at 11/17/2018 4098 Gross per 24 hour  Intake 480 ml  Output 800 ml  Net -320 ml   Last 3 Weights 11/17/2018 11/17/2018 11/16/2018  Weight (lbs) 140 lb 14.4 oz 140 lb 14.4 oz 145 lb 8.1 oz  Weight (kg) 63.912 kg 63.912 kg 66 kg      Telemetry    24 hours: Sinus rhythm. PVCs.  - Personally Reviewed  ECG    Sinus rhythm.  Rate 67 bpm.  LBBB.  - Personally Reviewed  Physical Exam   VS:  BP 128/72   Pulse (!) 59   Temp 98.3 F (36.8 C) (Oral)   Resp 16   Ht 5\' 10"  (1.778 m)   Wt 63.9 kg   SpO2 98%   BMI 20.22 kg/m  , BMI Body mass index is 20.22 kg/m. GENERAL:  Well appearing HEENT:  Pupils equal round and reactive, fundi not visualized, oral mucosa unremarkable NECK:  No jugular venous distention, waveform within normal limits, carotid upstroke brisk and symmetric, no bruits LUNGS:  Clear to auscultation bilaterally HEART:  RRR.  PMI not displaced or sustained,S1 and S2 within normal limits, no S3, no S4, no clicks, no rubs, no murmurs ABD:  Flat, positive bowel sounds normal in frequency in pitch, no bruits, no rebound, no guarding, no midline pulsatile mass, no hepatomegaly, no splenomegaly EXT:  2 plus pulses throughout, no edema, no cyanosis no clubbing SKIN:  No rashes no nodules NEURO:  Cranial nerves II through XII grossly intact, motor grossly intact throughout PSYCH:  Cognitively intact, oriented to person place and time   Labs    Chemistry Recent Labs  Lab 11/11/18 1515 11/12/18 0041  11/15/18 0522  11/15/18 1414 11/16/18 0358 11/17/18 0123  NA 135 138   < > 140   < > 141 136 137  K 3.8 3.5   < > 4.5   < > 4.1 3.7 3.8  CL 101 98   < > 102  --   --  100 99  CO2 29 34*   < > 32  --   --  29 33*  GLUCOSE 423* 407*   < > 187*  --   --  324* 234*  BUN 14 16   < > 21  --   --  21 22  CREATININE 0.70 0.81   < > 0.83  --   --  0.97 1.15  CALCIUM 8.5* 8.5*   < > 8.7*  --   --  8.3* 8.6*  PROT 7.0 6.7  --   --   --   --   --  6.8  ALBUMIN 3.1* 2.9*  --   --   --   --   --  2.9*  AST 28 25  --   --   --   --   --  28  ALT 47* 41  --   --   --   --   --  25  ALKPHOS 158* 163*  --   --   --   --   --  119  BILITOT 1.3* 1.1  --   --   --   --   --  1.4*  GFRNONAA >60 >60   < > >60  --   --  >60 >60  GFRAA >60 >60   < > >60  --   --  >60 >60  ANIONGAP 5 6   < > 6  --   --  7 5   < > = values in this interval not displayed.     Hematology Recent Labs  Lab 11/13/18 0526 11/15/18 0522 11/15/18 1408 11/15/18 1414 11/16/18 0358  WBC 4.7 4.9  --   --  4.7  RBC 3.81* 4.28  --   --  3.79*  HGB 11.6* 13.2 13.3 12.9* 11.7*  HCT 36.2* 40.1 39.0 38.0* 34.4*   MCV 95.0 93.7  --   --  90.8  MCH 30.4 30.8  --   --  30.9  MCHC 32.0 32.9  --   --  34.0  RDW 12.3 12.2  --   --  11.8  PLT 190 207  --   --  199    Cardiac Enzymes Recent Labs  Lab 11/11/18 1515 11/11/18 2350 11/12/18 0048 11/12/18 0547  TROPONINI <0.03 <0.03 0.03* 0.03*   No results for input(s): TROPIPOC in the last 168 hours.   BNP Recent Labs  Lab 11/11/18 1515  BNP 746.4*     DDimer No results for input(s): DDIMER in the last 168 hours.   Radiology    No results found.  Cardiac Studies   LHC 11/15/18:  Prox LAD to Mid LAD lesion is 40% stenosed.  Mid Cx to Dist Cx lesion is 25% stenosed.  Ost 2nd Diag to 2nd Diag lesion is 50% stenosed.  Ost 3rd Mrg lesion is 25% stenosed.  Mid LAD lesion is 90% stenosed.  A drug-eluting stent was successfully placed using a STENT SYNERGY DES 2.5X16.  Post intervention, there is a 0% residual stenosis.  LV end diastolic pressure is normal.  There is no aortic valve stenosis.  Ao 92%, PA 66%, mean PA 17 mm Hg, mean PCWP 3 mm Hg, CO 5.1; CI 2.8- normal right heart pressures  Tortuous right subclavian. 3DRC was used for RCA.   Continue aggressive secondary prevention in addition to dual antiplatelet therapy.  Adequate diuresis based on right heart pressures.  Continue medical therapy for heart failure.  Echo 11/12/18:  IMPRESSIONS  1. The left ventricle has severely reduced systolic function, with an ejection fraction of 25-30%. The cavity size was normal. There is moderately increased left ventricular wall thickness. Cardiac amyloidosis is a consideration based on the appearance  of the LV myocardium. Left ventricular diastolic Doppler parameters are consistent with impaired relaxation. Left ventricular diffuse hypokinesis.  2. The right ventricle has mildly reduced systolic function. The cavity was normal. There is mildly increased right ventricular wall thickness.  3. Left atrial size was severely dilated.   4. Right atrial size was mildly dilated.  5. Moderate pleural effusion in the left lateral region.  6. Trivial pericardial effusion is present.  7. No evidence of mitral valve stenosis. Trivial mitral regurgitation.  8. The aortic valve is tricuspid Mild calcification of the aortic valve. no stenosis of the aortic valve.  9. The aortic root and ascending aorta are normal in size and structure. 10. The inferior vena cava was normal in size with <50% respiratory variability. PA systolic pressure 41 mmHg.   Patient Profile     Mr. Hulbert is a 59M with hypertension and diabetes admitted with newly diagnosed acute systolic and diastolic heart failure.  Assessment & Plan    # Acute systolic and diastolic heart failure: LVEF 25-30%.  Newly diagnosed this admission.  He is now diuresed and euvolemic.  RA pressure 0 on cath.  Lasix was stopped 3/17.   Plan to start lasix 20mg  po daily on 3/19.  He will need a BMP in 1 week.  Switch losartan to Entresto 24/26mg  bid.  Given his NSVT will increase carvedilol to 12.5mg  bid.  Continue spironolactone.  He will need a repeat echo in 3 months.   # VT: Mr. Wilms had a 46 beat run of VT on 3/17. Since increasing carvedilol episodes of NSVT have been shorter.  PVCs still noted.    LVEF 25-30%.  Life Vest and continue carvedilol.  # NSTEMI:  Troponin elevated to 0.03.  90% mid LAD lesion treated with DES this admission.  Continue DAPT 1 year.  LDL 81 this admission.  Started rosuvastatin 20mg  qhs this admission.  Check lipids/CMP in 6-8 weeks.   # DM:  Would recommend an SGLT2 inhibitor.   [facility] will sign off.  Stable for discharge from cardiac standpoint. Medication Recommendations:  See above Other recommendations (labs, testing, etc):  BMP in 1 week, lipids/CMP in 6-8 weeks Follow up as an outpatient:  We will arrange   For questions or updates, please contact [place] Please consult [email or web address] for contact info under         Signed, [name], MD  11/17/2018, 9:22 AM

## 2018-11-17 NOTE — Progress Notes (Signed)
CARDIAC REHAB PHASE I   Talked with pt at length about importance of having pharmacy or his doctor check his updated list of medicine with the herbal medicine his wife is giving him at home. Talked about potential interactions medications can have and why it was important to use caution with adding over the counter medications, especially in the setting of diabetes medications and antiplatelets. Pt states understanding.   [id number] Rufina Falco, RN BSN 11/17/2018 9:18 AM

## 2019-03-14 ENCOUNTER — Other Ambulatory Visit: Payer: Self-pay

## 2019-03-14 ENCOUNTER — Emergency Department (HOSPITAL_COMMUNITY)
Admission: EM | Admit: 2019-03-14 | Discharge: 2019-03-14 | Disposition: A | Discharge status: Home / Self Care | Payer: Medicare Other | Attending: Emergency Medicine | Admitting: Emergency Medicine

## 2019-03-14 ENCOUNTER — Encounter (HOSPITAL_COMMUNITY): Payer: Self-pay | Admitting: Emergency Medicine

## 2019-03-14 DIAGNOSIS — Z955 Presence of coronary angioplasty implant and graft: Secondary | ICD-10-CM | POA: Insufficient documentation

## 2019-03-14 DIAGNOSIS — Z79899 Other long term (current) drug therapy: Secondary | ICD-10-CM | POA: Diagnosis not present

## 2019-03-14 DIAGNOSIS — I10 Essential (primary) hypertension: Secondary | ICD-10-CM | POA: Insufficient documentation

## 2019-03-14 DIAGNOSIS — E11649 Type 2 diabetes mellitus with hypoglycemia without coma: Secondary | ICD-10-CM | POA: Insufficient documentation

## 2019-03-14 DIAGNOSIS — Z794 Long term (current) use of insulin: Secondary | ICD-10-CM | POA: Insufficient documentation

## 2019-03-14 DIAGNOSIS — E162 Hypoglycemia, unspecified: Secondary | ICD-10-CM

## 2019-03-14 DIAGNOSIS — R4182 Altered mental status, unspecified: Secondary | ICD-10-CM | POA: Diagnosis present

## 2019-03-14 LAB — BASIC METABOLIC PANEL
Anion gap: 7 (ref 5–15)
BUN: 17 mg/dL (ref 8–23)
CO2: 23 mmol/L (ref 22–32)
Calcium: 8.4 mg/dL — ABNORMAL LOW (ref 8.9–10.3)
Chloride: 108 mmol/L (ref 98–111)
Creatinine, Ser: 0.78 mg/dL (ref 0.61–1.24)
GFR calc Af Amer: >60 mL/min (ref >60)
GFR calc non Af Amer: >60 mL/min (ref >60)
Glucose, Bld: 171 mg/dL — ABNORMAL HIGH (ref 70–99)
Potassium: 3.4 mmol/L — ABNORMAL LOW (ref 3.5–5.1)
Sodium: 138 mmol/L (ref 135–145)

## 2019-03-14 LAB — CBC WITH DIFFERENTIAL/PLATELET
Abs Immature Granulocytes: 0.02 10*3/uL (ref 0.00–0.07)
Basophils Absolute: 0 10*3/uL (ref 0.0–0.1)
Basophils Relative: 0 %
Eosinophils Absolute: 0 10*3/uL (ref 0.0–0.5)
Eosinophils Relative: 1 %
HCT: 31.5 % — ABNORMAL LOW (ref 39.0–52.0)
Hemoglobin: 10.8 g/dL — ABNORMAL LOW (ref 13.0–17.0)
Immature Granulocytes: 0 %
Lymphocytes Relative: 10 %
Lymphs Abs: 0.6 10*3/uL — ABNORMAL LOW (ref 0.7–4.0)
MCH: 33.3 pg (ref 26.0–34.0)
MCHC: 34.3 g/dL (ref 30.0–36.0)
MCV: 97.2 fL (ref 80.0–100.0)
Monocytes Absolute: 0.4 10*3/uL (ref 0.1–1.0)
Monocytes Relative: 6 %
Neutro Abs: 5.3 10*3/uL (ref 1.7–7.7)
Neutrophils Relative %: 83 %
Platelets: 176 10*3/uL (ref 150–400)
RBC: 3.24 MIL/uL — ABNORMAL LOW (ref 4.22–5.81)
RDW: 12 % (ref 11.5–15.5)
WBC: 6.4 10*3/uL (ref 4.0–10.5)
nRBC: 0 % (ref 0.0–0.2)

## 2019-03-14 LAB — CBG MONITORING, ED
Glucose-Capillary: 71 mg/dL (ref 70–99)
Glucose-Capillary: 79 mg/dL (ref 70–99)

## 2019-03-14 NOTE — ED Notes (Signed)
Checked patient cbg it was 21 notified RN of blood sugar gave patient a [place] sandwich and a ginger ale patient is eating with call bell and phone at bedside

## 2019-03-14 NOTE — ED Provider Notes (Signed)
[facility] EMERGENCY DEPARTMENT Provider Note   CSN: [id number] Arrival date & time: 03/14/19  1201     History   Chief Complaint Chief Complaint  Patient presents with  . Hypoglycemia    HPI Earl Calderon is a 67 y.o. male.     68 year old male with prior medical history as detailed below presents for evaluation of AMS.  Patient was found minimally responsive at home this morning.  EMS was called and arrived shortly thereafter. They noted that his sugar was approximately 40.  This has been treated.  Patient is now back to his baseline.  Patient reports that approximately 4:00 in the morning he had "some juice" and felt like his sugar was high.  He gave himself 8 units of insulin.  He did not check his sugar at that time.  Subsequently he was found to be hypoglycemic.  He reports that this is happened in the past.  Last reported meal was yesterday evening where he had some steak.  The history is provided by the patient, the EMS personnel and medical records.  Hypoglycemia Initial blood sugar:  40 Severity:  Moderate Onset quality:  Unable to specify Timing:  Rare Progression:  Resolved Chronicity:  New Diabetic status:  Controlled with insulin Context: decreased oral intake   Relieved by:  IV glucose   Past Medical History:  Diagnosis Date  . Coronary artery disease   . Diabetes mellitus without complication ([place])   . Hernia, inguinal   . Hypertension   . Uncontrolled type 2 diabetes mellitus ([place]) 11/11/2018    Patient Active Problem List   Diagnosis Date Noted  . Status post coronary artery stent placement   . Hypertensive urgency   . LBBB (left bundle branch block) 11/13/2018  . Acute combined systolic (congestive) and diastolic (congestive) heart failure ([place]) 11/11/2018  . Hypertension 11/11/2018  . Uncontrolled type 2 diabetes mellitus ([place]) 11/11/2018  . Hypomagnesemia 11/11/2018  . Hyperbilirubinemia 11/11/2018  . Abnormal LFTs  11/11/2018    Past Surgical History:  Procedure Laterality Date  . CORONARY STENT INTERVENTION N/A 11/15/2018   Procedure: CORONARY STENT INTERVENTION;  Surgeon: [name], MD;  Location: [facility];  Service: Cardiovascular;  Laterality: N/A;  . HEMORROIDECTOMY    . RIGHT/LEFT HEART CATH AND CORONARY ANGIOGRAPHY N/A 11/15/2018   Procedure: RIGHT/LEFT HEART CATH AND CORONARY ANGIOGRAPHY;  Surgeon: [name], MD;  Location: [facility];  Service: Cardiovascular;  Laterality: N/A;        Home Medications    Prior to Admission medications   Medication Sig Start Date End Date Taking? Authorizing Provider  blood glucose meter kit and supplies KIT Dispense based on patient and insurance preference. Use up to four times daily as directed. (FOR ICD-9 250.00, 250.01). 11/17/18   Florencia Reasons, MD  carvedilol (COREG) 12.5 MG tablet Take 1 tablet (12.5 mg total) by mouth 2 (two) times daily with a meal. 11/17/18   Florencia Reasons, MD  furosemide (LASIX) 20 MG tablet Take 1 tablet (20 mg total) by mouth daily. 11/18/18   Florencia Reasons, MD  insulin aspart (NOVOLOG FLEXPEN) 100 UNIT/ML FlexPen Before each meal 3 times a day, 140-199 - 2 units, 200-250 - 4 units, 251-299 - 6 units,  300-349 - 8 units,  350 or above 10 units. Insulin PEN if approved, provide syringes and needles if needed. 11/17/18   Florencia Reasons, MD  insulin detemir (LEVEMIR) 100 unit/ml SOLN Inject 0.08 mLs (8 Units  total) into the skin 2 (two) times daily. 11/17/18   Florencia Reasons, MD  Insulin Pen Needle (PEN NEEDLES 29GX1/2") 29G X 12MM MISC Diabetes pen needles for a month supply 11/17/18   Florencia Reasons, MD  rosuvastatin (CRESTOR) 20 MG tablet Take 1 tablet (20 mg total) by mouth daily at 6 PM. 11/17/18   Florencia Reasons, MD  sacubitril-valsartan (ENTRESTO) 24-26 MG Take 1 tablet by mouth 2 (two) times daily. 11/17/18   Florencia Reasons, MD  spironolactone (ALDACTONE) 25 MG tablet Take 1 tablet (25 mg total) by mouth daily. 11/18/18   Florencia Reasons, MD   ticagrelor (BRILINTA) 90 MG TABS tablet Take 1 tablet (90 mg total) by mouth 2 (two) times daily. 11/17/18   Florencia Reasons, MD    Family History Family History  Problem Relation Age of Onset  . Hypertension Mother   . Diabetes Mellitus II Mother   . Arrhythmia Mother   . Heart disease Mother   . Hypertension Sister   . Hypertension Brother   . Hypertension Other   . Diabetes Mellitus II Other        All 9 sibblings have HTN    Social History Social History   Tobacco Use  . Smoking status: Never Smoker  . Smokeless tobacco: Never Used  Substance Use Topics  . Alcohol use: Never    Frequency: Never  . Drug use: Never     Allergies   Patient has no known allergies.   Review of Systems Review of Systems  All other systems reviewed and are negative.    Physical Exam Updated Vital Signs BP (!) 157/98 (BP Location: Right Arm)   Pulse (!) 57   Temp (!) 97.5 F (36.4 C) (Oral)   Resp 14   Ht _0  (1.778 m)   Wt 63.5 kg   SpO2 98%   BMI 20.09 kg/m   Physical Exam Vitals signs and nursing note reviewed.  Constitutional:      General: He is not in acute distress.    Appearance: He is well-developed.  HENT:     Head: Normocephalic and atraumatic.  Eyes:     Conjunctiva/sclera: Conjunctivae normal.     Pupils: Pupils are equal, round, and reactive to light.  Neck:     Musculoskeletal: Normal range of motion and neck supple.  Cardiovascular:     Rate and Rhythm: Normal rate and regular rhythm.     Heart sounds: Normal heart sounds.  Pulmonary:     Effort: Pulmonary effort is normal. No respiratory distress.     Breath sounds: Normal breath sounds.  Abdominal:     General: There is no distension.     Palpations: Abdomen is soft.     Tenderness: There is no abdominal tenderness.  Musculoskeletal: Normal range of motion.        General: No deformity.  Skin:    General: Skin is warm and dry.  Neurological:     Mental Status: He is alert and oriented to  person, place, and time. Mental status is at baseline.      ED Treatments / Results  Labs (all labs ordered are listed, but only abnormal results are displayed) Labs Reviewed  BASIC METABOLIC PANEL - Abnormal; Notable for the following components:      Result Value   Potassium 3.4 (*)    Glucose, Bld 171 (*)    Calcium 8.4 (*)    All other components within normal limits  CBC WITH DIFFERENTIAL/PLATELET - Abnormal;  Notable for the following components:   RBC 3.24 (*)    Hemoglobin 10.8 (*)    HCT 31.5 (*)    Lymphs Abs 0.6 (*)    All other components within normal limits  CBG MONITORING, ED  CBG MONITORING, ED    EKG None  Radiology No results found.  Procedures Procedures (including critical care time)  Medications Ordered in ED Medications - No data to display   Initial Impression / Assessment and Plan / ED Course  I have reviewed the triage vital signs and the nursing notes.  Pertinent labs & imaging results that were available during my care of the patient were reviewed by me and considered in my medical decision making (see chart for details).        MDM  Screen complete  [facility] Cu was evaluated in Emergency Department on 03/14/2019 for the symptoms described in the history of present illness. He was evaluated in the context of the global COVID-19 pandemic, which necessitated consideration that the patient might be at risk for infection with the SARS-CoV-2 virus that causes COVID-19. Institutional protocols and algorithms that pertain to the evaluation of patients at risk for COVID-19 are in a state of rapid change based on information released by regulatory bodies including the CDC and federal and state organizations. These policies and algorithms were followed during the patient's care in the ED.   Patient is presenting for evaluation of hypoglycemia.  This was successfully treated by EMS prior to arrival.  The patient has been given food upon  arrival to the ED.  Screening labs are without significant abnormality.  Patient's blood sugar has remained in the appropriate range.  Following his period of evaluation in the ED he now desires discharge.  It appears that his episode of hypoglycemia was likely secondary to using insulin in the middle of the night with minimal p.o. intake.  Strict return precautions given and understood.  Importance of close follow-up is repeatedly stressed.  Final Clinical Impressions(s) / ED Diagnoses   Final diagnoses:  Hypoglycemia    ED Discharge Orders    None       Valarie Merino, MD 03/14/19 1311

## 2019-03-14 NOTE — ED Notes (Signed)
Checked patient cbg it was 9 notified RN of blood sugar patient is resting with call bell in reach

## 2019-03-14 NOTE — Discharge Instructions (Signed)
Please return for any problem.  Follow-up with your regular care providers as instructed.    It is important to check your sugar prior to taking insulin.

## 2019-03-14 NOTE — ED Notes (Signed)
Got patient undress on the monitor patient is resting with call bell in reach 

## 2019-03-14 NOTE — ED Triage Notes (Signed)
Pt BIB [facility] from home. Pt hypoglycemic, agitated, and combative upon EMS arrival. Pt CBG 45 upon EMS arrival. Pt given 25 g D10 via EMS, pt CBG up to 191 on EMS srrival. VSS.

## 2020-07-09 ENCOUNTER — Encounter (HOSPITAL_BASED_OUTPATIENT_CLINIC_OR_DEPARTMENT_OTHER): Payer: Self-pay | Admitting: *Deleted

## 2020-07-09 ENCOUNTER — Inpatient Hospital Stay (HOSPITAL_BASED_OUTPATIENT_CLINIC_OR_DEPARTMENT_OTHER)
Admission: EM | Admit: 2020-07-09 | Discharge: 2020-07-19 | DRG: 286 | Disposition: A | Discharge status: Home / Self Care | Payer: Medicare Other | Attending: Internal Medicine | Admitting: Internal Medicine

## 2020-07-09 ENCOUNTER — Other Ambulatory Visit: Payer: Self-pay

## 2020-07-09 ENCOUNTER — Emergency Department (HOSPITAL_BASED_OUTPATIENT_CLINIC_OR_DEPARTMENT_OTHER): Payer: Medicare Other

## 2020-07-09 DIAGNOSIS — I639 Cerebral infarction, unspecified: Secondary | ICD-10-CM | POA: Diagnosis present

## 2020-07-09 DIAGNOSIS — Z9114 Patient's other noncompliance with medication regimen: Secondary | ICD-10-CM

## 2020-07-09 DIAGNOSIS — I5043 Acute on chronic combined systolic (congestive) and diastolic (congestive) heart failure: Secondary | ICD-10-CM | POA: Diagnosis present

## 2020-07-09 DIAGNOSIS — Z79899 Other long term (current) drug therapy: Secondary | ICD-10-CM

## 2020-07-09 DIAGNOSIS — Z955 Presence of coronary angioplasty implant and graft: Secondary | ICD-10-CM

## 2020-07-09 DIAGNOSIS — Z8249 Family history of ischemic heart disease and other diseases of the circulatory system: Secondary | ICD-10-CM

## 2020-07-09 DIAGNOSIS — I509 Heart failure, unspecified: Secondary | ICD-10-CM | POA: Diagnosis not present

## 2020-07-09 DIAGNOSIS — E785 Hyperlipidemia, unspecified: Secondary | ICD-10-CM | POA: Diagnosis present

## 2020-07-09 DIAGNOSIS — Z833 Family history of diabetes mellitus: Secondary | ICD-10-CM

## 2020-07-09 DIAGNOSIS — Z20822 Contact with and (suspected) exposure to covid-19: Secondary | ICD-10-CM | POA: Diagnosis present

## 2020-07-09 DIAGNOSIS — D649 Anemia, unspecified: Secondary | ICD-10-CM | POA: Diagnosis present

## 2020-07-09 DIAGNOSIS — I472 Ventricular tachycardia: Secondary | ICD-10-CM | POA: Diagnosis not present

## 2020-07-09 DIAGNOSIS — I2510 Atherosclerotic heart disease of native coronary artery without angina pectoris: Secondary | ICD-10-CM | POA: Diagnosis present

## 2020-07-09 DIAGNOSIS — H538 Other visual disturbances: Secondary | ICD-10-CM | POA: Diagnosis present

## 2020-07-09 DIAGNOSIS — I110 Hypertensive heart disease with heart failure: Secondary | ICD-10-CM | POA: Diagnosis not present

## 2020-07-09 DIAGNOSIS — R0600 Dyspnea, unspecified: Secondary | ICD-10-CM

## 2020-07-09 DIAGNOSIS — R109 Unspecified abdominal pain: Secondary | ICD-10-CM

## 2020-07-09 DIAGNOSIS — E1165 Type 2 diabetes mellitus with hyperglycemia: Secondary | ICD-10-CM | POA: Diagnosis present

## 2020-07-09 DIAGNOSIS — K5900 Constipation, unspecified: Secondary | ICD-10-CM | POA: Diagnosis present

## 2020-07-09 DIAGNOSIS — Z794 Long term (current) use of insulin: Secondary | ICD-10-CM

## 2020-07-09 DIAGNOSIS — I272 Pulmonary hypertension, unspecified: Secondary | ICD-10-CM | POA: Diagnosis present

## 2020-07-09 DIAGNOSIS — I255 Ischemic cardiomyopathy: Secondary | ICD-10-CM | POA: Diagnosis present

## 2020-07-09 LAB — COMPREHENSIVE METABOLIC PANEL
ALT: 12 U/L (ref 0–44)
AST: 16 U/L (ref 15–41)
Albumin: 2.6 g/dL — ABNORMAL LOW (ref 3.5–5.0)
Alkaline Phosphatase: 110 U/L (ref 38–126)
Anion gap: 9 (ref 5–15)
BUN: 14 mg/dL (ref 8–23)
CO2: 31 mmol/L (ref 22–32)
Calcium: 8.5 mg/dL — ABNORMAL LOW (ref 8.9–10.3)
Chloride: 98 mmol/L (ref 98–111)
Creatinine, Ser: 0.96 mg/dL (ref 0.61–1.24)
GFR, Estimated: >60 mL/min (ref >60)
Glucose, Bld: 416 mg/dL — ABNORMAL HIGH (ref 70–99)
Potassium: 3.6 mmol/L (ref 3.5–5.1)
Sodium: 138 mmol/L (ref 135–145)
Total Bilirubin: 1 mg/dL (ref 0.3–1.2)
Total Protein: 6.9 g/dL (ref 6.5–8.1)

## 2020-07-09 LAB — CBC WITH DIFFERENTIAL/PLATELET
Abs Immature Granulocytes: 0.01 10*3/uL (ref 0.00–0.07)
Basophils Absolute: 0 10*3/uL (ref 0.0–0.1)
Basophils Relative: 0 %
Eosinophils Absolute: 0.1 10*3/uL (ref 0.0–0.5)
Eosinophils Relative: 2 %
HCT: 41.5 % (ref 39.0–52.0)
Hemoglobin: 13.7 g/dL (ref 13.0–17.0)
Immature Granulocytes: 0 %
Lymphocytes Relative: 11 %
Lymphs Abs: 0.6 10*3/uL — ABNORMAL LOW (ref 0.7–4.0)
MCH: 31 pg (ref 26.0–34.0)
MCHC: 33 g/dL (ref 30.0–36.0)
MCV: 93.9 fL (ref 80.0–100.0)
Monocytes Absolute: 0.4 10*3/uL (ref 0.1–1.0)
Monocytes Relative: 7 %
Neutro Abs: 4.7 10*3/uL (ref 1.7–7.7)
Neutrophils Relative %: 80 %
Platelets: 186 10*3/uL (ref 150–400)
RBC: 4.42 MIL/uL (ref 4.22–5.81)
RDW: 13.3 % (ref 11.5–15.5)
WBC: 5.8 10*3/uL (ref 4.0–10.5)
nRBC: 0 % (ref 0.0–0.2)

## 2020-07-09 LAB — URINALYSIS, ROUTINE W REFLEX MICROSCOPIC
Bilirubin Urine: NEGATIVE
Glucose, UA: >=500 mg/dL — AB
Ketones, ur: NEGATIVE mg/dL
Leukocytes,Ua: NEGATIVE
Nitrite: NEGATIVE
Protein, ur: 100 mg/dL — AB
Specific Gravity, Urine: 1.02 (ref 1.005–1.030)
pH: 6.5 (ref 5.0–8.0)

## 2020-07-09 LAB — BRAIN NATRIURETIC PEPTIDE: B Natriuretic Peptide: 985.7 pg/mL — ABNORMAL HIGH (ref 0.0–100.0)

## 2020-07-09 LAB — TROPONIN I (HIGH SENSITIVITY): Troponin I (High Sensitivity): 25 ng/L — ABNORMAL HIGH (ref <18)

## 2020-07-09 LAB — URINALYSIS, MICROSCOPIC (REFLEX): WBC, UA: NONE SEEN WBC/hpf (ref 0–5)

## 2020-07-09 LAB — RESPIRATORY PANEL BY RT PCR (FLU A&B, COVID)
Influenza A by PCR: NEGATIVE
Influenza B by PCR: NEGATIVE
SARS Coronavirus 2 by RT PCR: NEGATIVE

## 2020-07-09 MED ORDER — INSULIN REGULAR HUMAN 100 UNIT/ML IJ SOLN
15.0000 [IU] | Freq: Once | INTRAMUSCULAR | Status: AC
  Administered 2020-07-09: 15 [IU] via SUBCUTANEOUS
  Filled 2020-07-09: qty 3

## 2020-07-09 MED ORDER — CARVEDILOL 12.5 MG PO TABS
12.5000 mg | ORAL_TABLET | Freq: Two times a day (BID) | ORAL | Status: DC
  Administered 2020-07-09 – 2020-07-15 (×12): 12.5 mg via ORAL
  Filled 2020-07-09 (×12): qty 1

## 2020-07-09 MED ORDER — INSULIN DETEMIR 100 UNIT/ML FLEXPEN
8.0000 [IU] | Freq: Two times a day (BID) | SUBCUTANEOUS | Status: DC
  Filled 2020-07-09: qty 3

## 2020-07-09 MED ORDER — INSULIN ASPART PROT & ASPART (70-30 MIX) 100 UNIT/ML ~~LOC~~ SUSP: 15.0000 [IU] | Freq: Once | SUBCUTANEOUS | Status: DC

## 2020-07-09 MED ORDER — INSULIN ASPART 100 UNIT/ML ~~LOC~~ SOLN
SUBCUTANEOUS | Status: AC
  Administered 2020-07-09: 15 [IU]
  Filled 2020-07-09: qty 15

## 2020-07-09 MED ORDER — ROSUVASTATIN CALCIUM 20 MG PO TABS
20.0000 mg | ORAL_TABLET | Freq: Every day | ORAL | Status: DC
  Administered 2020-07-10 – 2020-07-11 (×2): 20 mg via ORAL
  Filled 2020-07-09 (×3): qty 1

## 2020-07-09 MED ORDER — CARVEDILOL 12.5 MG PO TABS: 12.5000 mg | ORAL_TABLET | Freq: Two times a day (BID) | ORAL | Status: DC

## 2020-07-09 MED ORDER — HYDRALAZINE HCL 20 MG/ML IJ SOLN
10.0000 mg | Freq: Once | INTRAMUSCULAR | Status: AC
  Administered 2020-07-09: 10 mg via INTRAVENOUS
  Filled 2020-07-09: qty 1

## 2020-07-09 MED ORDER — FUROSEMIDE 10 MG/ML IJ SOLN
20.0000 mg | Freq: Once | INTRAMUSCULAR | Status: AC
  Administered 2020-07-09: 20 mg via INTRAVENOUS
  Filled 2020-07-09: qty 2

## 2020-07-09 MED ORDER — TICAGRELOR 90 MG PO TABS
90.0000 mg | ORAL_TABLET | Freq: Two times a day (BID) | ORAL | Status: DC
  Administered 2020-07-10 – 2020-07-13 (×7): 90 mg via ORAL
  Filled 2020-07-09 (×7): qty 1

## 2020-07-09 MED ORDER — FUROSEMIDE 20 MG PO TABS: 20.0000 mg | ORAL_TABLET | Freq: Every day | ORAL | Status: DC

## 2020-07-09 MED ORDER — SPIRONOLACTONE 25 MG PO TABS
25.0000 mg | ORAL_TABLET | Freq: Every day | ORAL | Status: DC
  Administered 2020-07-10 – 2020-07-19 (×10): 25 mg via ORAL
  Filled 2020-07-09 (×10): qty 1

## 2020-07-09 MED ORDER — SACUBITRIL-VALSARTAN 24-26 MG PO TABS
1.0000 | ORAL_TABLET | Freq: Two times a day (BID) | ORAL | Status: DC
  Administered 2020-07-10 – 2020-07-14 (×10): 1 via ORAL
  Filled 2020-07-09 (×12): qty 1

## 2020-07-09 MED ORDER — SACUBITRIL-VALSARTAN 24-26 MG PO TABS
1.0000 | ORAL_TABLET | Freq: Two times a day (BID) | ORAL | Status: DC
  Filled 2020-07-09: qty 1

## 2020-07-09 NOTE — ED Notes (Signed)
15 UNITS NOVOLOG INSULIN GIVEN PER EDP VERBAL ORDERS, RT DELTOID AREA

## 2020-07-09 NOTE — ED Triage Notes (Addendum)
C/o generalized weakness , fatigue x 3 weeks, loss of vision x 2 weeks, also c/o bil leg swelling  With ulcers , not taking BP meds,

## 2020-07-09 NOTE — ED Provider Notes (Signed)
[facility] EMERGENCY DEPARTMENT Provider Note   CSN: [id number] Arrival date & time: 07/09/20  1731     History Chief Complaint  Patient presents with  . Weakness    Earl Calderon is a 69 y.o. male presented to emergency department with shortness of breath for the past 3 weeks.  Patient reports he has had progressive fatigue, worsening bilateral leg swelling, shortness of breath and dizziness for the past 3 weeks.  He describes orthopnea with worsening shortness of breath at night, when laying on his left side.  He denies any chest pain or pressure.  He reports he does not receive the COVID vaccinations.  He was in the hospital in March 2020 per medical record review.  At that time he was treated for coronary artery disease and congestive heart failure, with an EF of 20 to 25% on echo, and a heart cath resulting in a stent placed in mid LAD (90%) stenosis.  He was recommended for dual antiplatelet therapy.  Discharged on  Brilinta and aspirin.  He has not followed up with cardiology since then.   He reports that he stopped taking all medications approximately 1 year ago, as he then decided to resort to herbal remedies.  He has not taken any medications, including his Lasix, blood pressure medicines, insulin for the past year.  He says been trying to eat better and eat less salty food and diet.  He does not smoke.  Last echo March 2020, EF 25-30%  LHC/RHC in March 2020, impression:   Prox LAD to Mid LAD lesion is 40% stenosed.  Mid Cx to Dist Cx lesion is 25% stenosed.  Ost 2nd Diag to 2nd Diag lesion is 50% stenosed.  Ost 3rd Mrg lesion is 25% stenosed.  Mid LAD lesion is 90% stenosed.  A drug-eluting stent was successfully placed using a STENT SYNERGY DES 2.5X16.  Post intervention, there is a 0% residual stenosis.  LV end diastolic pressure is normal.  There is no aortic valve stenosis.  Ao 92%, PA 66%, mean PA 17 mm Hg, mean PCWP 3 mm Hg, CO 5.1; CI  2.8- normal right heart pressures  Tortuous right subclavian. 3DRC was used for RCA.   Continue aggressive secondary prevention in addition to dual antiplatelet therapy.   Adequate diuresis based on right heart pressures.  Continue medical therapy for heart failure.   HPI     Past Medical History:  Diagnosis Date  . Coronary artery disease   . Diabetes mellitus without complication ([place])   . Hernia, inguinal   . Hypertension   . Uncontrolled type 2 diabetes mellitus ([place]) 11/11/2018    Patient Active Problem List   Diagnosis Date Noted  . Acute on chronic congestive heart failure ([place]) 07/10/2020  . Status post coronary artery stent placement   . Hypertensive urgency   . LBBB (left bundle branch block) 11/13/2018  . Acute combined systolic (congestive) and diastolic (congestive) heart failure ([place]) 11/11/2018  . Hypertension 11/11/2018  . Uncontrolled type 2 diabetes mellitus ([place]) 11/11/2018  . Hypomagnesemia 11/11/2018  . Hyperbilirubinemia 11/11/2018  . Abnormal LFTs 11/11/2018    Past Surgical History:  Procedure Laterality Date  . CORONARY STENT INTERVENTION N/A 11/15/2018   Procedure: CORONARY STENT INTERVENTION;  Surgeon: [name], MD;  Location: [facility];  Service: Cardiovascular;  Laterality: N/A;  . HEMORROIDECTOMY    . RIGHT/LEFT HEART CATH AND CORONARY ANGIOGRAPHY N/A 11/15/2018   Procedure: RIGHT/LEFT HEART CATH AND CORONARY ANGIOGRAPHY;  Surgeon: [name], MD;  Location: [facility];  Service: Cardiovascular;  Laterality: N/A;       Family History  Problem Relation Age of Onset  . Hypertension Mother   . Diabetes Mellitus II Mother   . Arrhythmia Mother   . Heart disease Mother   . Hypertension Sister   . Hypertension Brother   . Hypertension Other   . Diabetes Mellitus II Other        All 9 sibblings have HTN    Social History   Tobacco Use  . Smoking status: Never Smoker  . Smokeless tobacco: Never  Used  Vaping Use  . Vaping Use: Never used  Substance Use Topics  . Alcohol use: Never  . Drug use: Never    Home Medications Prior to Admission medications   Medication Sig Start Date End Date Taking? Authorizing Provider  blood glucose meter kit and supplies KIT Dispense based on patient and insurance preference. Use up to four times daily as directed. (FOR ICD-9 250.00, 250.01). 11/17/18   Florencia Reasons, MD  carvedilol (COREG) 12.5 MG tablet Take 1 tablet (12.5 mg total) by mouth 2 (two) times daily with a meal. 11/17/18   Florencia Reasons, MD  furosemide (LASIX) 20 MG tablet Take 1 tablet (20 mg total) by mouth daily. 11/18/18   Florencia Reasons, MD  insulin aspart (NOVOLOG FLEXPEN) 100 UNIT/ML FlexPen Before each meal 3 times a day, 140-199 - 2 units, 200-250 - 4 units, 251-299 - 6 units,  300-349 - 8 units,  350 or above 10 units. Insulin PEN if approved, provide syringes and needles if needed. 11/17/18   Florencia Reasons, MD  insulin detemir (LEVEMIR) 100 unit/ml SOLN Inject 0.08 mLs (8 Units total) into the skin 2 (two) times daily. 11/17/18   Florencia Reasons, MD  Insulin Pen Needle (PEN NEEDLES 29GX1/2") 29G X 12MM MISC Diabetes pen needles for a month supply 11/17/18   Florencia Reasons, MD  rosuvastatin (CRESTOR) 20 MG tablet Take 1 tablet (20 mg total) by mouth daily at 6 PM. 11/17/18   Florencia Reasons, MD  sacubitril-valsartan (ENTRESTO) 24-26 MG Take 1 tablet by mouth 2 (two) times daily. 11/17/18   Florencia Reasons, MD  spironolactone (ALDACTONE) 25 MG tablet Take 1 tablet (25 mg total) by mouth daily. 11/18/18   Florencia Reasons, MD  ticagrelor (BRILINTA) 90 MG TABS tablet Take 1 tablet (90 mg total) by mouth 2 (two) times daily. 11/17/18   Florencia Reasons, MD    Allergies    Patient has no known allergies.  Review of Systems   Review of Systems  Constitutional: Positive for appetite change and fatigue. Negative for chills and fever.  HENT: Negative for ear pain and sore throat.   Eyes: Negative for photophobia, pain and visual disturbance.    Respiratory: Positive for cough and shortness of breath.   Cardiovascular: Negative for chest pain and palpitations.  Gastrointestinal: Negative for abdominal pain and vomiting.  Genitourinary: Negative for dysuria and hematuria.  Musculoskeletal: Negative for arthralgias and myalgias.  Skin: Negative for color change and rash.  Neurological: Positive for light-headedness. Negative for syncope and headaches.  Psychiatric/Behavioral: Negative for agitation and confusion.  All other systems reviewed and are negative.   Physical Exam Updated Vital Signs BP (!) 141/86   Pulse 64   Temp 98.3 F (36.8 C) (Oral)   Resp 19   Ht 5' 10"  (1.778 m)   Wt 63.5 kg   SpO2 96%   BMI  20.09 kg/m   Physical Exam Vitals and nursing note reviewed.  Constitutional:      Appearance: He is well-developed.  HENT:     Head: Normocephalic and atraumatic.  Eyes:     Conjunctiva/sclera: Conjunctivae normal.  Cardiovascular:     Rate and Rhythm: Normal rate and regular rhythm.     Pulses: Normal pulses.  Pulmonary:     Effort: Pulmonary effort is normal. No respiratory distress.     Breath sounds: Normal breath sounds.     Comments: RR 30 95% on room air Abdominal:     Palpations: Abdomen is soft.     Tenderness: There is no abdominal tenderness.  Musculoskeletal:     Cervical back: Neck supple.     Right lower leg: Edema present.     Left lower leg: Edema present.  Skin:    General: Skin is warm and dry.  Neurological:     General: No focal deficit present.     Mental Status: He is alert and oriented to person, place, and time.     ED Results / Procedures / Treatments   Labs (all labs ordered are listed, but only abnormal results are displayed) Labs Reviewed  CBC WITH DIFFERENTIAL/PLATELET - Abnormal; Notable for the following components:      Result Value   Lymphs Abs 0.6 (*)    All other components within normal limits  COMPREHENSIVE METABOLIC PANEL - Abnormal; Notable for the  following components:   Glucose, Bld 416 (*)    Calcium 8.5 (*)    Albumin 2.6 (*)    All other components within normal limits  BRAIN NATRIURETIC PEPTIDE - Abnormal; Notable for the following components:   B Natriuretic Peptide 985.7 (*)    All other components within normal limits  URINALYSIS, ROUTINE W REFLEX MICROSCOPIC - Abnormal; Notable for the following components:   Glucose, UA >=500 (*)    Hgb urine dipstick MODERATE (*)    Protein, ur 100 (*)    All other components within normal limits  URINALYSIS, MICROSCOPIC (REFLEX) - Abnormal; Notable for the following components:   Bacteria, UA RARE (*)    All other components within normal limits  CBG MONITORING, ED - Abnormal; Notable for the following components:   Glucose-Capillary 125 (*)    All other components within normal limits  CBG MONITORING, ED - Abnormal; Notable for the following components:   Glucose-Capillary 138 (*)    All other components within normal limits  TROPONIN I (HIGH SENSITIVITY) - Abnormal; Notable for the following components:   Troponin I (High Sensitivity) 25 (*)    All other components within normal limits  RESPIRATORY PANEL BY RT PCR (FLU A&B, COVID)  HEMOGLOBIN A1C  TROPONIN I (HIGH SENSITIVITY)    EKG None  Radiology CT Head Wo Contrast  Result Date: 07/09/2020 CLINICAL DATA:  Generalized weakness, fatigue for 3 weeks, vision loss for 2 weeks EXAM: CT HEAD WITHOUT CONTRAST TECHNIQUE: Contiguous axial images were obtained from the base of the skull through the vertex without intravenous contrast. COMPARISON:  None. FINDINGS: Brain: Focal hypodensities in the left basal ganglia and right thalamus consistent with chronic lacunar infarcts. Scattered hypodensities throughout the periventricular white matter are consistent with age-indeterminate small vessel ischemic changes. No evidence of hemorrhage. Lateral ventricles and remaining midline structures are unremarkable. No acute extra-axial fluid  collections. No mass effect. Vascular: No hyperdense vessel or unexpected calcification. Skull: Normal. Negative for fracture or focal lesion. Sinuses/Orbits: No acute finding. Other: None.  IMPRESSION: 1. Age-indeterminate small-vessel ischemic changes throughout the periventricular white matter. 2. Chronic lacunar infarcts left basal ganglia and right thalamus. 3. No acute hemorrhage. Electronically Signed   By: Randa Ngo M.D.   On: 07/09/2020 18:09   DG Chest Portable 1 View  Result Date: 07/09/2020 CLINICAL DATA:  Shortness of breath EXAM: PORTABLE CHEST 1 VIEW COMPARISON:  None. FINDINGS: The heart size and mediastinal contours are mildly enlarged. There is prominence of the central pulmonary vasculature. Elevation of the right hemidiaphragm is seen. IMPRESSION: Mild cardiomegaly and pulmonary vascular congestion Electronically Signed   By: Prudencio Pair M.D.   On: 07/09/2020 23:43    Procedures .Critical Care Performed by: Wyvonnia Dusky, MD Authorized by: Wyvonnia Dusky, MD   Critical care provider statement:    Critical care time (minutes):  40   Critical care was necessary to treat or prevent imminent or life-threatening deterioration of the following conditions:  Cardiac failure   Critical care was time spent personally by me on the following activities:  Discussions with consultants, evaluation of patient's response to treatment, examination of patient, ordering and performing treatments and interventions, ordering and review of laboratory studies, ordering and review of radiographic studies, pulse oximetry, re-evaluation of patient's condition, obtaining history from patient or surrogate and review of old charts Comments:     IV diuresis for CHF   (including critical care time)  Medications Ordered in ED Medications  carvedilol (COREG) tablet 12.5 mg (12.5 mg Oral Given 07/10/20 0753)  rosuvastatin (CRESTOR) tablet 20 mg (has no administration in time range)    sacubitril-valsartan (ENTRESTO) 24-26 mg per tablet (1 tablet Oral Not Given 07/09/20 2350)  spironolactone (ALDACTONE) tablet 25 mg (has no administration in time range)  ticagrelor (BRILINTA) tablet 90 mg (90 mg Oral Not Given 07/09/20 2345)  insulin aspart (novoLOG) injection 0-15 Units (2 Units Subcutaneous Given 07/10/20 0812)  furosemide (LASIX) injection 20 mg (20 mg Intravenous Given 07/10/20 0754)  hydrALAZINE (APRESOLINE) injection 10 mg (has no administration in time range)  insulin glargine (LANTUS) injection 8 Units (has no administration in time range)  furosemide (LASIX) injection 20 mg (20 mg Intravenous Given 07/09/20 2203)  insulin regular (NOVOLIN R) 100 units/mL injection 15 Units (15 Units Subcutaneous Given by Other 07/09/20 2217)  hydrALAZINE (APRESOLINE) injection 10 mg (10 mg Intravenous Given 07/09/20 2310)    ED Course  I have reviewed the triage vital signs and the nursing notes.  Pertinent labs & imaging results that were available during my care of the patient were reviewed by me and considered in my medical decision making (see chart for details).  This patient complains of shortness of breath and leg swelling.  This involves an extensive number of treatment options, and is a complaint that carries with it a high risk of complications and morbidity.  The differential diagnosis includes CHF exacerbation vs PNA vs Covid illness vs ACS vs other   With his history of not taking medications for the past year, I suspect this is likely a CHF exacerbation.  His ECG per my interpretation shows the same LBBB pattern seen on prior tracings, but does not meet [place] criteria.  I ordered, reviewed, and interpreted labs, which included BNP 985, trop 25 (repeat pending), Glucose 418, CMP and CBC otherwise unremarkable.  Covid/flu negative. I ordered medication insulin, IV hydralazine, PO coreg for HTN, hyperglycemia I ordered imaging studies which included CTH (ordered at intake  for dizziness, which the patient denied for me, no  acute findings, chronic stroke noted).  Xray chest still pending. I independently visualized and interpreted imaging which showed no acute stroke and the monitor tracing which showed NSR Additional history was obtained from patient's wife by phone Previous records obtained and reviewed showing 2020 hospitalization course, echo and RHC/LHC report   After the interventions stated above, I reevaluated the patient and found symptomatic improvement of his breathing, with some persistent tachypnea, and minor improvement of BP.  Clinical Course as of Jul 10 940  [facility] Jul 09, 2020  2310 Pt breathing better.  I spoke to him and his wife on the phone.  She expresses very strong concerns that he cannot adequately follow up with cardiology.  She states they have no transportation, she herself is very sick, they have very little money.  I am concerned he would not be able to establish rapid follow up care and a speedy outpatient echocardiogram, and given his level of heart failure a year ago, I would advise admission for an echo and cardiology consult at this point.  Pt and his wife agreeable.   [MT]  2356 Admitted to hospitalist Dr Cyd Silence   [MT]    Clinical Course User Index [MT] Langston Masker Carola Rhine, MD     Final Clinical Impression(s) / ED Diagnoses Final diagnoses:  Acute on chronic congestive heart failure, unspecified heart failure type ([place])  Dyspnea, unspecified type    Rx / DC Orders ED Discharge Orders    None       Wyvonnia Dusky, MD 07/10/20 [phone]

## 2020-07-10 ENCOUNTER — Observation Stay (HOSPITAL_COMMUNITY): Payer: Medicare Other

## 2020-07-10 DIAGNOSIS — Z794 Long term (current) use of insulin: Secondary | ICD-10-CM | POA: Diagnosis not present

## 2020-07-10 DIAGNOSIS — I255 Ischemic cardiomyopathy: Secondary | ICD-10-CM | POA: Diagnosis not present

## 2020-07-10 DIAGNOSIS — Z955 Presence of coronary angioplasty implant and graft: Secondary | ICD-10-CM | POA: Diagnosis not present

## 2020-07-10 DIAGNOSIS — I5033 Acute on chronic diastolic (congestive) heart failure: Secondary | ICD-10-CM | POA: Diagnosis not present

## 2020-07-10 DIAGNOSIS — E1165 Type 2 diabetes mellitus with hyperglycemia: Secondary | ICD-10-CM | POA: Diagnosis not present

## 2020-07-10 DIAGNOSIS — E785 Hyperlipidemia, unspecified: Secondary | ICD-10-CM | POA: Diagnosis not present

## 2020-07-10 DIAGNOSIS — Z20822 Contact with and (suspected) exposure to covid-19: Secondary | ICD-10-CM | POA: Diagnosis not present

## 2020-07-10 DIAGNOSIS — I509 Heart failure, unspecified: Secondary | ICD-10-CM

## 2020-07-10 DIAGNOSIS — Z79899 Other long term (current) drug therapy: Secondary | ICD-10-CM | POA: Diagnosis not present

## 2020-07-10 DIAGNOSIS — I272 Pulmonary hypertension, unspecified: Secondary | ICD-10-CM | POA: Diagnosis not present

## 2020-07-10 DIAGNOSIS — Z9114 Patient's other noncompliance with medication regimen: Secondary | ICD-10-CM | POA: Diagnosis not present

## 2020-07-10 DIAGNOSIS — K5900 Constipation, unspecified: Secondary | ICD-10-CM | POA: Diagnosis not present

## 2020-07-10 DIAGNOSIS — I639 Cerebral infarction, unspecified: Secondary | ICD-10-CM | POA: Diagnosis not present

## 2020-07-10 DIAGNOSIS — R0602 Shortness of breath: Secondary | ICD-10-CM | POA: Diagnosis not present

## 2020-07-10 DIAGNOSIS — I2510 Atherosclerotic heart disease of native coronary artery without angina pectoris: Secondary | ICD-10-CM | POA: Diagnosis not present

## 2020-07-10 DIAGNOSIS — Z833 Family history of diabetes mellitus: Secondary | ICD-10-CM | POA: Diagnosis not present

## 2020-07-10 DIAGNOSIS — H538 Other visual disturbances: Secondary | ICD-10-CM | POA: Diagnosis not present

## 2020-07-10 DIAGNOSIS — I472 Ventricular tachycardia: Secondary | ICD-10-CM | POA: Diagnosis not present

## 2020-07-10 DIAGNOSIS — D649 Anemia, unspecified: Secondary | ICD-10-CM | POA: Diagnosis not present

## 2020-07-10 DIAGNOSIS — I5043 Acute on chronic combined systolic (congestive) and diastolic (congestive) heart failure: Secondary | ICD-10-CM | POA: Diagnosis not present

## 2020-07-10 DIAGNOSIS — I5023 Acute on chronic systolic (congestive) heart failure: Secondary | ICD-10-CM | POA: Diagnosis not present

## 2020-07-10 DIAGNOSIS — Z8249 Family history of ischemic heart disease and other diseases of the circulatory system: Secondary | ICD-10-CM | POA: Diagnosis not present

## 2020-07-10 DIAGNOSIS — I110 Hypertensive heart disease with heart failure: Secondary | ICD-10-CM | POA: Diagnosis not present

## 2020-07-10 LAB — CBC
HCT: 38.1 % — ABNORMAL LOW (ref 39.0–52.0)
Hemoglobin: 12.4 g/dL — ABNORMAL LOW (ref 13.0–17.0)
MCH: 30.9 pg (ref 26.0–34.0)
MCHC: 32.5 g/dL (ref 30.0–36.0)
MCV: 95 fL (ref 80.0–100.0)
Platelets: 165 10*3/uL (ref 150–400)
RBC: 4.01 MIL/uL — ABNORMAL LOW (ref 4.22–5.81)
RDW: 13.1 % (ref 11.5–15.5)
WBC: 5.7 10*3/uL (ref 4.0–10.5)
nRBC: 0 % (ref 0.0–0.2)

## 2020-07-10 LAB — HEMOGLOBIN A1C
Hgb A1c MFr Bld: 9.2 % — ABNORMAL HIGH (ref 4.8–5.6)
Mean Plasma Glucose: 217.34 mg/dL

## 2020-07-10 LAB — COMPREHENSIVE METABOLIC PANEL
ALT: 9 U/L (ref 0–44)
AST: 14 U/L — ABNORMAL LOW (ref 15–41)
Albumin: 2.1 g/dL — ABNORMAL LOW (ref 3.5–5.0)
Alkaline Phosphatase: 94 U/L (ref 38–126)
Anion gap: 5 (ref 5–15)
BUN: 16 mg/dL (ref 8–23)
CO2: 33 mmol/L — ABNORMAL HIGH (ref 22–32)
Calcium: 8.4 mg/dL — ABNORMAL LOW (ref 8.9–10.3)
Chloride: 103 mmol/L (ref 98–111)
Creatinine, Ser: 0.93 mg/dL (ref 0.61–1.24)
GFR, Estimated: >60 mL/min (ref >60)
Glucose, Bld: 221 mg/dL — ABNORMAL HIGH (ref 70–99)
Potassium: 3.7 mmol/L (ref 3.5–5.1)
Sodium: 141 mmol/L (ref 135–145)
Total Bilirubin: 1 mg/dL (ref 0.3–1.2)
Total Protein: 5.5 g/dL — ABNORMAL LOW (ref 6.5–8.1)

## 2020-07-10 LAB — CBC WITH DIFFERENTIAL/PLATELET
Abs Immature Granulocytes: 0.01 10*3/uL (ref 0.00–0.07)
Basophils Absolute: 0 10*3/uL (ref 0.0–0.1)
Basophils Relative: 0 %
Eosinophils Absolute: 0.2 10*3/uL (ref 0.0–0.5)
Eosinophils Relative: 3 %
HCT: 36.1 % — ABNORMAL LOW (ref 39.0–52.0)
Hemoglobin: 12.2 g/dL — ABNORMAL LOW (ref 13.0–17.0)
Immature Granulocytes: 0 %
Lymphocytes Relative: 12 %
Lymphs Abs: 0.7 10*3/uL (ref 0.7–4.0)
MCH: 31.8 pg (ref 26.0–34.0)
MCHC: 33.8 g/dL (ref 30.0–36.0)
MCV: 94 fL (ref 80.0–100.0)
Monocytes Absolute: 0.4 10*3/uL (ref 0.1–1.0)
Monocytes Relative: 7 %
Neutro Abs: 4.4 10*3/uL (ref 1.7–7.7)
Neutrophils Relative %: 78 %
Platelets: 164 10*3/uL (ref 150–400)
RBC: 3.84 MIL/uL — ABNORMAL LOW (ref 4.22–5.81)
RDW: 13 % (ref 11.5–15.5)
WBC: 5.7 10*3/uL (ref 4.0–10.5)
nRBC: 0 % (ref 0.0–0.2)

## 2020-07-10 LAB — ECHOCARDIOGRAM COMPLETE
Area-P 1/2: 3.27 cm2
Height: 70 in
S' Lateral: 3.9 cm
Weight: 2240 oz

## 2020-07-10 LAB — GLUCOSE, CAPILLARY
Glucose-Capillary: 197 mg/dL — ABNORMAL HIGH (ref 70–99)
Glucose-Capillary: 259 mg/dL — ABNORMAL HIGH (ref 70–99)
Glucose-Capillary: 185 mg/dL — ABNORMAL HIGH (ref 70–99)

## 2020-07-10 LAB — CBG MONITORING, ED
Glucose-Capillary: 125 mg/dL — ABNORMAL HIGH (ref 70–99)
Glucose-Capillary: 138 mg/dL — ABNORMAL HIGH (ref 70–99)

## 2020-07-10 LAB — TROPONIN I (HIGH SENSITIVITY)
Troponin I (High Sensitivity): 35 ng/L — ABNORMAL HIGH (ref <18)
Troponin I (High Sensitivity): 31 ng/L — ABNORMAL HIGH (ref <18)

## 2020-07-10 LAB — HIV ANTIBODY (ROUTINE TESTING W REFLEX): HIV Screen 4th Generation wRfx: NONREACTIVE

## 2020-07-10 LAB — CREATININE, SERUM
Creatinine, Ser: 0.93 mg/dL (ref 0.61–1.24)
GFR, Estimated: >60 mL/min (ref >60)

## 2020-07-10 LAB — MAGNESIUM: Magnesium: 1.7 mg/dL (ref 1.7–2.4)

## 2020-07-10 MED ORDER — FUROSEMIDE 10 MG/ML IJ SOLN
40.0000 mg | Freq: Every day | INTRAMUSCULAR | Status: DC
  Administered 2020-07-11: 40 mg via INTRAVENOUS
  Filled 2020-07-10: qty 4

## 2020-07-10 MED ORDER — FUROSEMIDE 10 MG/ML IJ SOLN
20.0000 mg | Freq: Two times a day (BID) | INTRAMUSCULAR | Status: DC
  Administered 2020-07-10: 20 mg via INTRAVENOUS
  Filled 2020-07-10: qty 2

## 2020-07-10 MED ORDER — ACETAMINOPHEN 650 MG RE SUPP: 650.0000 mg | Freq: Four times a day (QID) | RECTAL | Status: DC | PRN

## 2020-07-10 MED ORDER — INSULIN ASPART 100 UNIT/ML ~~LOC~~ SOLN
0.0000 [IU] | Freq: Three times a day (TID) | SUBCUTANEOUS | Status: DC
  Administered 2020-07-10: 8 [IU] via SUBCUTANEOUS
  Administered 2020-07-10 – 2020-07-11 (×2): 3 [IU] via SUBCUTANEOUS
  Administered 2020-07-12: 8 [IU] via SUBCUTANEOUS
  Administered 2020-07-12: 5 [IU] via SUBCUTANEOUS
  Administered 2020-07-12 – 2020-07-14 (×4): 3 [IU] via SUBCUTANEOUS
  Administered 2020-07-14: 2 [IU] via SUBCUTANEOUS
  Administered 2020-07-15: 3 [IU] via SUBCUTANEOUS
  Administered 2020-07-15 – 2020-07-16 (×2): 5 [IU] via SUBCUTANEOUS
  Administered 2020-07-16: 3 [IU] via SUBCUTANEOUS
  Administered 2020-07-17: 2 [IU] via SUBCUTANEOUS
  Administered 2020-07-18: 5 [IU] via SUBCUTANEOUS
  Administered 2020-07-18: 3 [IU] via SUBCUTANEOUS
  Administered 2020-07-19: 2 [IU] via SUBCUTANEOUS

## 2020-07-10 MED ORDER — ONDANSETRON HCL 4 MG PO TABS: 4.0000 mg | ORAL_TABLET | Freq: Four times a day (QID) | ORAL | Status: DC | PRN

## 2020-07-10 MED ORDER — ENOXAPARIN SODIUM 40 MG/0.4ML ~~LOC~~ SOLN
40.0000 mg | SUBCUTANEOUS | Status: DC
  Administered 2020-07-10 – 2020-07-17 (×7): 40 mg via SUBCUTANEOUS
  Filled 2020-07-10 (×7): qty 0.4

## 2020-07-10 MED ORDER — HYDRALAZINE HCL 20 MG/ML IJ SOLN: 10.0000 mg | Freq: Four times a day (QID) | INTRAMUSCULAR | Status: DC | PRN

## 2020-07-10 MED ORDER — INSULIN ASPART 100 UNIT/ML ~~LOC~~ SOLN
0.0000 [IU] | Freq: Every day | SUBCUTANEOUS | Status: DC
  Administered 2020-07-12: 3 [IU] via SUBCUTANEOUS
  Administered 2020-07-13 – 2020-07-14 (×2): 2 [IU] via SUBCUTANEOUS
  Administered 2020-07-15: 3 [IU] via SUBCUTANEOUS

## 2020-07-10 MED ORDER — ACETAMINOPHEN 325 MG PO TABS
650.0000 mg | ORAL_TABLET | Freq: Four times a day (QID) | ORAL | Status: DC | PRN
  Administered 2020-07-18: 650 mg via ORAL

## 2020-07-10 MED ORDER — ONDANSETRON HCL 4 MG/2ML IJ SOLN
4.0000 mg | Freq: Four times a day (QID) | INTRAMUSCULAR | Status: DC | PRN
  Administered 2020-07-12: 4 mg via INTRAVENOUS
  Filled 2020-07-10: qty 2

## 2020-07-10 MED ORDER — INSULIN GLARGINE 100 UNIT/ML ~~LOC~~ SOLN
8.0000 [IU] | Freq: Once | SUBCUTANEOUS | Status: AC
  Administered 2020-07-10: 8 [IU] via SUBCUTANEOUS
  Filled 2020-07-10: qty 0.08

## 2020-07-10 MED ORDER — INSULIN ASPART 100 UNIT/ML ~~LOC~~ SOLN
0.0000 [IU] | Freq: Three times a day (TID) | SUBCUTANEOUS | Status: DC
  Administered 2020-07-10: 2 [IU] via SUBCUTANEOUS
  Filled 2020-07-10: qty 2

## 2020-07-10 NOTE — Progress Notes (Signed)
Failed attempt at MRI. Went to get patient; however, he stated "I just don't think I'll be able to hold still." Patient refused to attempt MRI at this time.

## 2020-07-10 NOTE — Progress Notes (Signed)
  Echocardiogram 2D Echocardiogram has been performed.  Earl Calderon 07/10/2020, 1:43 PM

## 2020-07-10 NOTE — ED Notes (Signed)
Spoke to pharmacy about Levemir, recommended changing Levemir to Lantus.  Dr. Melina Copa notified, orders received.

## 2020-07-10 NOTE — H&P (Signed)
History and Physical    Earl Calderon AVW:[id number] DOB: [birth year] DOA: 07/09/2020  PCP: Pcp, No  Patient coming from: [facility]  Chief Complaint: dyspnea, fatigue, leg swelling.  HPI: Earl Calderon is a 69 y.o. male with medical history significant of CAD, HFrEF, DM2. Presenting with increasing dizziness, weakness, leg swelling and dyspnea over the last 3 weeks. He states he first noticed his symptoms 3 weeks ago when his legs felt heavy and he could see them swelling. He began noticing that he was more short of breath with activity and he even felt a little dizzy at times. He tried herbal medicine and rest; but neither improved his help. He dealt with his symptoms for 3 week and decided to call his PCP. He was told to go to the ED.    Of note, he has not taken medicines for DM2, HFrEF, or CAD in over a year. He states that he was trying to remove "all the chemicals" from his body. He thought that all his conditions were resolved.   ED Course: CTH was negative. He was given lasix and his breathing improved. TRH was called for admission.   Review of Systems:  Denies CP, palpitations, syncopal episodes. Reports increased urinary frequency. Reports transient left visual loss. Review of systems is otherwise negative for all not mentioned in HPI.   PMHx Past Medical History:  Diagnosis Date  . Coronary artery disease   . Diabetes mellitus without complication ([place])   . Hernia, inguinal   . Hypertension   . Uncontrolled type 2 diabetes mellitus ([place]) 11/11/2018    PSHx Past Surgical History:  Procedure Laterality Date  . CORONARY STENT INTERVENTION N/A 11/15/2018   Procedure: CORONARY STENT INTERVENTION;  Surgeon: [name], MD;  Location: [place] [facility];  Service: Cardiovascular;  Laterality: N/A;  . HEMORROIDECTOMY    . RIGHT/LEFT HEART CATH AND CORONARY ANGIOGRAPHY N/A 11/15/2018   Procedure: RIGHT/LEFT HEART CATH AND CORONARY ANGIOGRAPHY;  Surgeon: [name], MD;  Location: [place] [facility];  Service: Cardiovascular;  Laterality: N/A;    SocHx  reports that he has never smoked. He has never used smokeless tobacco. He reports that he does not drink alcohol and does not use drugs.  No Known Allergies  FamHx Family History  Problem Relation Age of Onset  . Hypertension Mother   . Diabetes Mellitus II Mother   . Arrhythmia Mother   . Heart disease Mother   . Hypertension Sister   . Hypertension Brother   . Hypertension Other   . Diabetes Mellitus II Other        All 9 sibblings have HTN    Prior to Admission medications   Medication Sig Start Date End Date Taking? Authorizing Provider  blood glucose meter kit and supplies KIT Dispense based on patient and insurance preference. Use up to four times daily as directed. (FOR ICD-9 250.00, 250.01). 11/17/18   Florencia Reasons, MD  carvedilol (COREG) 12.5 MG tablet Take 1 tablet (12.5 mg total) by mouth 2 (two) times daily with a meal. 11/17/18   Florencia Reasons, MD  furosemide (LASIX) 20 MG tablet Take 1 tablet (20 mg total) by mouth daily. 11/18/18   Florencia Reasons, MD  insulin aspart (NOVOLOG FLEXPEN) 100 UNIT/ML FlexPen Before each meal 3 times a day, 140-199 - 2 units, 200-250 - 4 units, 251-299 - 6 units,  300-349 - 8 units,  350 or above 10 units. Insulin PEN if approved, provide syringes and needles if  needed. 11/17/18   Florencia Reasons, MD  insulin detemir (LEVEMIR) 100 unit/ml SOLN Inject 0.08 mLs (8 Units total) into the skin 2 (two) times daily. 11/17/18   Florencia Reasons, MD  Insulin Pen Needle (PEN NEEDLES 29GX1/2") 29G X 12MM MISC Diabetes pen needles for a month supply 11/17/18   Florencia Reasons, MD  rosuvastatin (CRESTOR) 20 MG tablet Take 1 tablet (20 mg total) by mouth daily at 6 PM. 11/17/18   Florencia Reasons, MD  sacubitril-valsartan (ENTRESTO) 24-26 MG Take 1 tablet by mouth 2 (two) times daily. 11/17/18   Florencia Reasons, MD  spironolactone (ALDACTONE) 25 MG tablet Take 1 tablet (25 mg total) by mouth daily. 11/18/18   Florencia Reasons, MD    ticagrelor (BRILINTA) 90 MG TABS tablet Take 1 tablet (90 mg total) by mouth 2 (two) times daily. 11/17/18   Florencia Reasons, MD    Physical Exam: Vitals:   07/10/20 0753 07/10/20 0800 07/10/20 0910 07/10/20 1049  BP: (!) 162/97 (!) 147/86 (!) 141/86 (!) 150/79  Pulse: 74 70 64 60  Resp: 15 14 19 18   Temp:    98.7 F (37.1 C)  TempSrc:      SpO2:  96% 96% 98%  Weight:      Height:        General: 69 y.o. male resting in bed in NAD Eyes: PERRL, normal sclera ENMT: Nares patent w/o discharge, orophaynx clear, dentition normal, ears w/o discharge/lesions/ulcers Neck: Supple, trachea midline Cardiovascular: RRR, +S1, S2, no m/g/r, equal pulses throughout Respiratory: CTABL, no w/r/r, normal WOB GI: BS+, NDNT, no masses noted, no organomegaly noted MSK: No e/c/c Skin: No rashes, bruises, 2 LLE wounds Neuro: A&O x 3, no focal deficits Psyc: Appropriate interaction and affect, calm/cooperative  Labs on Admission: I have personally reviewed following labs and imaging studies  CBC: Recent Labs  Lab 07/09/20 1752  WBC 5.8  NEUTROABS 4.7  HGB 13.7  HCT 41.5  MCV 93.9  PLT 073   Basic Metabolic Panel: Recent Labs  Lab 07/09/20 1752  NA 138  K 3.6  CL 98  CO2 31  GLUCOSE 416*  BUN 14  CREATININE 0.96  CALCIUM 8.5*   GFR: Estimated Creatinine Clearance: 67.1 mL/min (by C-G formula based on SCr of 0.96 mg/dL). Liver Function Tests: Recent Labs  Lab 07/09/20 1752  AST 16  ALT 12  ALKPHOS 110  BILITOT 1.0  PROT 6.9  ALBUMIN 2.6*   No results for input(s): LIPASE, AMYLASE in the last 168 hours. No results for input(s): AMMONIA in the last 168 hours. Coagulation Profile: No results for input(s): INR, PROTIME in the last 168 hours. Cardiac Enzymes: No results for input(s): CKTOTAL, CKMB, CKMBINDEX, TROPONINI in the last 168 hours. BNP (last 3 results) No results for input(s): PROBNP in the last 8760 hours. HbA1C: No results for input(s): HGBA1C in the last 72  hours. CBG: Recent Labs  Lab 07/10/20 0119 07/10/20 0804  GLUCAP 125* 138*   Lipid Profile: No results for input(s): CHOL, HDL, LDLCALC, TRIG, CHOLHDL, LDLDIRECT in the last 72 hours. Thyroid Function Tests: No results for input(s): TSH, T4TOTAL, FREET4, T3FREE, THYROIDAB in the last 72 hours. Anemia Panel: No results for input(s): VITAMINB12, FOLATE, FERRITIN, TIBC, IRON, RETICCTPCT in the last 72 hours. Urine analysis:    Component Value Date/Time   COLORURINE YELLOW 07/09/2020 2151   APPEARANCEUR CLEAR 07/09/2020 2151   LABSPEC 1.020 07/09/2020 2151   PHURINE 6.5 07/09/2020 2151   GLUCOSEU >=500 (A) 07/09/2020 2151  HGBUR MODERATE (A) 07/09/2020 2151   BILIRUBINUR NEGATIVE 07/09/2020 2151   [place] NEGATIVE 07/09/2020 2151   PROTEINUR 100 (A) 07/09/2020 2151   NITRITE NEGATIVE 07/09/2020 2151   LEUKOCYTESUR NEGATIVE 07/09/2020 2151    Radiological Exams on Admission: CT Head Wo Contrast  Result Date: 07/09/2020 CLINICAL DATA:  Generalized weakness, fatigue for 3 weeks, vision loss for 2 weeks EXAM: CT HEAD WITHOUT CONTRAST TECHNIQUE: Contiguous axial images were obtained from the base of the skull through the vertex without intravenous contrast. COMPARISON:  None. FINDINGS: Brain: Focal hypodensities in the left basal ganglia and right thalamus consistent with chronic lacunar infarcts. Scattered hypodensities throughout the periventricular white matter are consistent with age-indeterminate small vessel ischemic changes. No evidence of hemorrhage. Lateral ventricles and remaining midline structures are unremarkable. No acute extra-axial fluid collections. No mass effect. Vascular: No hyperdense vessel or unexpected calcification. Skull: Normal. Negative for fracture or focal lesion. Sinuses/Orbits: No acute finding. Other: None. IMPRESSION: 1. Age-indeterminate small-vessel ischemic changes throughout the periventricular white matter. 2. Chronic lacunar infarcts left basal  ganglia and right thalamus. 3. No acute hemorrhage. Electronically Signed   By: Randa Ngo M.D.   On: 07/09/2020 18:09   DG Chest Portable 1 View  Result Date: 07/09/2020 CLINICAL DATA:  Shortness of breath EXAM: PORTABLE CHEST 1 VIEW COMPARISON:  None. FINDINGS: The heart size and mediastinal contours are mildly enlarged. There is prominence of the central pulmonary vasculature. Elevation of the right hemidiaphragm is seen. IMPRESSION: Mild cardiomegaly and pulmonary vascular congestion Electronically Signed   By: Prudencio Pair M.D.   On: 07/09/2020 23:43   Assessment/Plan Acute on chronic HFrEF Dyspnea secondary to above Hx of CAD s/p stent placement     - admit to obs, tele     - continue IV lasix     - aldactone, entresto, coreg, brilinta resumed; check renal labs to see if we need to adjust anything     - echo ordered, trend troponins     - dependent on Echo results, may need cards consult     - reports breathing is improved, denies CP  Left eye visual impairment     - says his left eye vision was gone yesterday and day before, but has slowly returned     - no eye pain, burning; gross exam is ok     - check MRI brain  DM2     - lantus 8units qday, SSI, DM diet, A1c   HLD     - continue crestor  BLE weakness     - PT consult  Normocytic anemia     - mild, no evidence of bleed, follow up with PCP  Medical non-compliance     - he stopped his medicines because he "wanted to remove all the chemicals" from his body     - he doesn't seem to understand the severity of his illnesses and how compliance vs non-compliance can affect the quality of his remaining life     - may benefit from some HHRN follow up; consult TOC  DVT prophylaxis: lovenox  Code Status: FULL  Family Communication: None at bedside.  Consults called: None  Status is: Observation  The patient remains OBS appropriate and will d/c before 2 midnights.  Dispo: The patient is from: Home               Anticipated d/c is to: Home              Anticipated d/c  date is: 1 day              Patient currently is not medically stable to d/c.  Jonnie Finner DO [facility]  If 7PM-7AM, please contact night-coverage [email or web address]  07/10/2020, 11:01 AM

## 2020-07-10 NOTE — ED Notes (Signed)
Placed patient on 2L [place] due to ecrease in oxygen saturation of 86%. Patient oxygen saturation increased to 93%. Patient tolerated well.

## 2020-07-10 NOTE — ED Notes (Signed)
Checked CBG 125, RN Legrand Como informed

## 2020-07-10 NOTE — ED Notes (Signed)
Open skin area to BLE is dry.  Per pt, it started as a dry, flaky skin and he peel it off and end up with the wound.

## 2020-07-10 NOTE — ED Notes (Signed)
Per RN Legrand Como ok for pt to have frozen chicken dinner pt given the same and ice water to drink. Pt also given 2 warm blankets per pt request

## 2020-07-11 ENCOUNTER — Observation Stay (HOSPITAL_COMMUNITY): Payer: Medicare Other

## 2020-07-11 DIAGNOSIS — I472 Ventricular tachycardia: Secondary | ICD-10-CM | POA: Diagnosis not present

## 2020-07-11 DIAGNOSIS — K5900 Constipation, unspecified: Secondary | ICD-10-CM | POA: Diagnosis present

## 2020-07-11 DIAGNOSIS — E785 Hyperlipidemia, unspecified: Secondary | ICD-10-CM | POA: Diagnosis present

## 2020-07-11 DIAGNOSIS — I5043 Acute on chronic combined systolic (congestive) and diastolic (congestive) heart failure: Secondary | ICD-10-CM | POA: Diagnosis present

## 2020-07-11 DIAGNOSIS — I272 Pulmonary hypertension, unspecified: Secondary | ICD-10-CM | POA: Diagnosis present

## 2020-07-11 DIAGNOSIS — Z955 Presence of coronary angioplasty implant and graft: Secondary | ICD-10-CM | POA: Diagnosis not present

## 2020-07-11 DIAGNOSIS — Z9114 Patient's other noncompliance with medication regimen: Secondary | ICD-10-CM | POA: Diagnosis not present

## 2020-07-11 DIAGNOSIS — I2510 Atherosclerotic heart disease of native coronary artery without angina pectoris: Secondary | ICD-10-CM | POA: Diagnosis present

## 2020-07-11 DIAGNOSIS — H538 Other visual disturbances: Secondary | ICD-10-CM | POA: Diagnosis present

## 2020-07-11 DIAGNOSIS — Z794 Long term (current) use of insulin: Secondary | ICD-10-CM | POA: Diagnosis not present

## 2020-07-11 DIAGNOSIS — I255 Ischemic cardiomyopathy: Secondary | ICD-10-CM | POA: Diagnosis present

## 2020-07-11 DIAGNOSIS — Z79899 Other long term (current) drug therapy: Secondary | ICD-10-CM | POA: Diagnosis not present

## 2020-07-11 DIAGNOSIS — D649 Anemia, unspecified: Secondary | ICD-10-CM | POA: Diagnosis present

## 2020-07-11 DIAGNOSIS — Z20822 Contact with and (suspected) exposure to covid-19: Secondary | ICD-10-CM | POA: Diagnosis present

## 2020-07-11 DIAGNOSIS — I110 Hypertensive heart disease with heart failure: Secondary | ICD-10-CM | POA: Diagnosis present

## 2020-07-11 DIAGNOSIS — I639 Cerebral infarction, unspecified: Secondary | ICD-10-CM | POA: Diagnosis present

## 2020-07-11 DIAGNOSIS — E1165 Type 2 diabetes mellitus with hyperglycemia: Secondary | ICD-10-CM | POA: Diagnosis present

## 2020-07-11 DIAGNOSIS — Z833 Family history of diabetes mellitus: Secondary | ICD-10-CM | POA: Diagnosis not present

## 2020-07-11 DIAGNOSIS — I509 Heart failure, unspecified: Secondary | ICD-10-CM | POA: Diagnosis present

## 2020-07-11 DIAGNOSIS — I5023 Acute on chronic systolic (congestive) heart failure: Secondary | ICD-10-CM | POA: Diagnosis not present

## 2020-07-11 DIAGNOSIS — Z8249 Family history of ischemic heart disease and other diseases of the circulatory system: Secondary | ICD-10-CM | POA: Diagnosis not present

## 2020-07-11 DIAGNOSIS — I10 Essential (primary) hypertension: Secondary | ICD-10-CM | POA: Diagnosis not present

## 2020-07-11 DIAGNOSIS — R0600 Dyspnea, unspecified: Secondary | ICD-10-CM | POA: Diagnosis not present

## 2020-07-11 DIAGNOSIS — I517 Cardiomegaly: Secondary | ICD-10-CM | POA: Diagnosis not present

## 2020-07-11 LAB — COMPREHENSIVE METABOLIC PANEL
ALT: 11 U/L (ref 0–44)
AST: 15 U/L (ref 15–41)
Albumin: 2.2 g/dL — ABNORMAL LOW (ref 3.5–5.0)
Alkaline Phosphatase: 87 U/L (ref 38–126)
Anion gap: 6 (ref 5–15)
BUN: 26 mg/dL — ABNORMAL HIGH (ref 8–23)
CO2: 30 mmol/L (ref 22–32)
Calcium: 8 mg/dL — ABNORMAL LOW (ref 8.9–10.3)
Chloride: 101 mmol/L (ref 98–111)
Creatinine, Ser: 1.08 mg/dL (ref 0.61–1.24)
GFR, Estimated: >60 mL/min (ref >60)
Glucose, Bld: 131 mg/dL — ABNORMAL HIGH (ref 70–99)
Potassium: 3.6 mmol/L (ref 3.5–5.1)
Sodium: 137 mmol/L (ref 135–145)
Total Bilirubin: 0.8 mg/dL (ref 0.3–1.2)
Total Protein: 5.9 g/dL — ABNORMAL LOW (ref 6.5–8.1)

## 2020-07-11 LAB — CBC
HCT: 37.5 % — ABNORMAL LOW (ref 39.0–52.0)
Hemoglobin: 12.3 g/dL — ABNORMAL LOW (ref 13.0–17.0)
MCH: 31.4 pg (ref 26.0–34.0)
MCHC: 32.8 g/dL (ref 30.0–36.0)
MCV: 95.7 fL (ref 80.0–100.0)
Platelets: 180 10*3/uL (ref 150–400)
RBC: 3.92 MIL/uL — ABNORMAL LOW (ref 4.22–5.81)
RDW: 13 % (ref 11.5–15.5)
WBC: 4.9 10*3/uL (ref 4.0–10.5)
nRBC: 0 % (ref 0.0–0.2)

## 2020-07-11 LAB — GLUCOSE, CAPILLARY
Glucose-Capillary: 118 mg/dL — ABNORMAL HIGH (ref 70–99)
Glucose-Capillary: 104 mg/dL — ABNORMAL HIGH (ref 70–99)
Glucose-Capillary: 186 mg/dL — ABNORMAL HIGH (ref 70–99)
Glucose-Capillary: 164 mg/dL — ABNORMAL HIGH (ref 70–99)

## 2020-07-11 MED ORDER — SENNOSIDES-DOCUSATE SODIUM 8.6-50 MG PO TABS
2.0000 | ORAL_TABLET | Freq: Two times a day (BID) | ORAL | Status: DC
  Administered 2020-07-11 – 2020-07-19 (×15): 2 via ORAL
  Filled 2020-07-11 (×17): qty 2

## 2020-07-11 MED ORDER — FUROSEMIDE 10 MG/ML IJ SOLN
40.0000 mg | Freq: Two times a day (BID) | INTRAMUSCULAR | Status: DC
  Administered 2020-07-11 – 2020-07-17 (×12): 40 mg via INTRAVENOUS
  Filled 2020-07-11 (×12): qty 4

## 2020-07-11 NOTE — Consult Note (Signed)
[place] Nurse Consult Note: Reason for Consult:Patient with dry chronic skin changes and has peeled skin away revealing partial thickness tissue loss.  CHF exacerbation with increased edema to lower legs, and shortness of breath.  Noncompliant with medication regimen and does not want leg wraps but understands he may need compression for now to alleviate some excess fluid.   Wound type:CHF exacerbation with increased edema Pressure Injury POA:NA Measurement: 1 cm round open wounds.  Patient states he peels away dry skin revealing wounds.  Wound MKJ:IZXYO red Drainage (amount, consistency, odor) minimal serosanguinous  No odor Periwound:dry skin  Chronic skin changes  edema Dressing procedure/placement/frequency: Cleanse legs with soap and water.  Apply Haematologist.  Change weekly  REMOVE at discharge. Will not follow at this time.  Please re-consult if needed.  Domenic Moras MSN, RN, FNP-BC [facility]ON Wound, Ostomy, Continence Nurse Pager [phone]

## 2020-07-11 NOTE — Progress Notes (Signed)
PROGRESS NOTE  Earl Calderon GUY:[id number] DOB: 01-09-1951 DOA: 07/09/2020 PCP: Pcp, No  HPI/Recap of past 24 hours:  Earl Calderon is a 69 y.o. male with medical history significant of CAD, HFrEF, DM2. Presenting with increasing dizziness, weakness, leg swelling and dyspnea over the last 3 weeks. He states he first noticed his symptoms 3 weeks ago when his legs felt heavy and he could see them swelling. He began noticing that he was more short of breath with activity and he even felt a little dizzy at times. He tried herbal medicine and rest; but neither improved his help. He dealt with his symptoms for 3 week and decided to call his PCP. He was told to go to the ED.    Of note, he has not taken medicines for DM2, HFrEF, or CAD in over a year. He states that he was trying to remove "all the chemicals" from his body. He thought that all his conditions were resolved.   ED Course: CTH was negative. He was given lasix and his breathing improved. TRH was called for admission.   07/11/20: Seen and examined.  Denies any chest pain or respiratory 16.  His breathing is improved.  2D echo completed on 07/10/2020 showed LVEF 30 to 35%, left ventricle global hypokinesis, left atrial severely dilated, right atrial moderately dilated, mild mitral valve regurgitation, grade 2 diastolic dysfunction.  Cardiology has been consulted and will likely see in consultation on 07/12/2020.  Assessment/Plan: Active Problems:   Acute on chronic congestive heart failure (HCC)  Acute on chronic combined diastolic and systolic CHF 2D echo completed on 07/10/2020 showed LVEF 30 to 35%, left ventricle global hypokinesis, left atrial severely dilated, right atrial moderately dilated, mild mitral valve regurgitation, grade 2 diastolic dysfunction.  Cardiology has been consulted and will likely see in consultation on 07/12/2020. 2D echo done on 11/12/2018 showed LVEF 25 to 30% Ongoing diuresing with IV Lasix 40 mg daily,  increase frequency to twice daily. Net I&O -2.7 L Strict I's and O's and daily weight  Coronary artery disease status post PCI with stent placement Denies any anginal symptoms at the time of this exam Continue cardiac medication Cardiology consulted  Left eye blurriness/visual impairment History of CVA seen on CT head, patient made aware Could not tolerate MRI brain  Type 2 diabetes with hyperglycemia Continue insulin sliding scale Continue Lantus Get A1c  Hyperlipidemia/chronic normocytic anemia Resume home medications No evidence of overt bleeding  Medication noncompliance Patient stopped taking his current medication for over a year, stated he wanted to remove all the chemicals from his body.      Code Status: Full code  Family Communication: None at bedside.  Disposition Plan: Likely will discharge to home   Consultants:  Cardiology  Procedures:  None  Antimicrobials:  None  DVT prophylaxis: Subcu Lovenox daily  Status is: Observation    Dispo:  Patient From: Home  Planned Disposition: Home  Expected discharge date: 07/13/20  Medically stable for discharge: No, ongoing diuresing and management of combined systolic and diastolic CHF.    Objective: Vitals:   07/10/20 1346 07/10/20 2026 07/11/20 0551 07/11/20 1313  BP: (!) 170/95 (!) 155/78 134/75 (!) 171/98  Pulse: (!) 58 61 68 (!) 59  Resp: 19 18    Temp: 98.5 F (36.9 C) 98.1 F (36.7 C) 98.5 F (36.9 C) 99.5 F (37.5 C)  TempSrc: Oral   Oral  SpO2: 100% 97% 100% 100%  Weight:   65.4 kg   Height:  Intake/Output Summary (Last 24 hours) at 07/11/2020 1524 Last data filed at 07/11/2020 1447 Gross per 24 hour  Intake 120 ml  Output 2150 ml  Net -2030 ml   Filed Weights   07/09/20 1741 07/11/20 0551  Weight: 63.5 kg 65.4 kg    Exam:  . General: 69 y.o. year-old male well developed well nourished in no acute distress.  Alert and oriented x3. . Cardiovascular: Regular  rate and rhythm with no rubs or gallops.  No thyromegaly or JVD noted.   Marland Kitchen Respiratory: Clear to auscultation with no wheezes or rales. Good inspiratory effort. . Abdomen: Soft nontender nondistended with normal bowel sounds x4 quadrants. . Musculoskeletal: Trace lower extremity edema bilaterally.   Marland Kitchen Psychiatry: Mood is appropriate for condition and setting   Data Reviewed: CBC: Recent Labs  Lab 07/09/20 1752 07/10/20 1116 07/10/20 1310 07/11/20 0532  WBC 5.8 5.7 5.7 4.9  NEUTROABS 4.7 4.4  --   --   HGB 13.7 12.2* 12.4* 12.3*  HCT 41.5 36.1* 38.1* 37.5*  MCV 93.9 94.0 95.0 95.7  PLT 186 164 165 409   Basic Metabolic Panel: Recent Labs  Lab 07/09/20 1752 07/10/20 1116 07/10/20 1310 07/11/20 0532  NA 138 141  --  137  K 3.6 3.7  --  3.6  CL 98 103  --  101  CO2 31 33*  --  30  GLUCOSE 416* 221*  --  131*  BUN 14 16  --  26*  CREATININE 0.96 0.93 0.93 1.08  CALCIUM 8.5* 8.4*  --  8.0*  MG  --  1.7  --   --    GFR: Estimated Creatinine Clearance: 61.4 mL/min (by C-G formula based on SCr of 1.08 mg/dL). Liver Function Tests: Recent Labs  Lab 07/09/20 1752 07/10/20 1116 07/11/20 0532  AST 16 14* 15  ALT 12 9 11   ALKPHOS 110 94 87  BILITOT 1.0 1.0 0.8  PROT 6.9 5.5* 5.9*  ALBUMIN 2.6* 2.1* 2.2*   No results for input(s): LIPASE, AMYLASE in the last 168 hours. No results for input(s): AMMONIA in the last 168 hours. Coagulation Profile: No results for input(s): INR, PROTIME in the last 168 hours. Cardiac Enzymes: No results for input(s): CKTOTAL, CKMB, CKMBINDEX, TROPONINI in the last 168 hours. BNP (last 3 results) No results for input(s): PROBNP in the last 8760 hours. HbA1C: Recent Labs    07/10/20 1310  HGBA1C 9.2*   CBG: Recent Labs  Lab 07/10/20 1211 07/10/20 1729 07/10/20 2024 07/11/20 0753 07/11/20 1157  GLUCAP 197* 259* 185* 118* 104*   Lipid Profile: No results for input(s): CHOL, HDL, LDLCALC, TRIG, CHOLHDL, LDLDIRECT in the last 72  hours. Thyroid Function Tests: No results for input(s): TSH, T4TOTAL, FREET4, T3FREE, THYROIDAB in the last 72 hours. Anemia Panel: No results for input(s): VITAMINB12, FOLATE, FERRITIN, TIBC, IRON, RETICCTPCT in the last 72 hours. Urine analysis:    Component Value Date/Time   COLORURINE YELLOW 07/09/2020 2151   APPEARANCEUR CLEAR 07/09/2020 2151   LABSPEC 1.020 07/09/2020 2151   PHURINE 6.5 07/09/2020 2151   GLUCOSEU >=500 (A) 07/09/2020 2151   HGBUR MODERATE (A) 07/09/2020 2151   BILIRUBINUR NEGATIVE 07/09/2020 2151   [place] NEGATIVE 07/09/2020 2151   PROTEINUR 100 (A) 07/09/2020 2151   NITRITE NEGATIVE 07/09/2020 2151   LEUKOCYTESUR NEGATIVE 07/09/2020 2151   Sepsis Labs: @LABRCNTIP (procalcitonin:4,lacticidven:4)  ) Recent Results (from the past 240 hour(s))  Respiratory Panel by RT PCR (Flu A&B, Covid) - Nasopharyngeal Swab  Status: None   Collection Time: 07/09/20  9:51 PM   Specimen: Nasopharyngeal Swab  Result Value Ref Range Status   SARS Coronavirus 2 by RT PCR NEGATIVE NEGATIVE Final    Comment: (NOTE) SARS-CoV-2 target nucleic acids are NOT DETECTED.  The SARS-CoV-2 RNA is generally detectable in upper respiratoy specimens during the acute phase of infection. The lowest concentration of SARS-CoV-2 viral copies this assay can detect is 131 copies/mL. A negative result does not preclude SARS-Cov-2 infection and should not be used as the sole basis for treatment or other patient management decisions. A negative result may occur with  improper specimen collection/handling, submission of specimen other than nasopharyngeal swab, presence of viral mutation(s) within the areas targeted by this assay, and inadequate number of viral copies (<131 copies/mL). A negative result must be combined with clinical observations, patient history, and epidemiological information. The expected result is Negative.  Fact Sheet for Patients:    PinkCheek.be  Fact Sheet for Healthcare Providers:  GravelBags.it  This test is no t yet approved or cleared by the [place] FDA and  has been authorized for detection and/or diagnosis of SARS-CoV-2 by FDA under an Emergency Use Authorization (EUA). This EUA will remain  in effect (meaning this test can be used) for the duration of the COVID-19 declaration under Section 564(b)(1) of the Act, 21 U.S.C. section 360bbb-3(b)(1), unless the authorization is terminated or revoked sooner.     Influenza A by PCR NEGATIVE NEGATIVE Final   Influenza B by PCR NEGATIVE NEGATIVE Final    Comment: (NOTE) The Xpert Xpress SARS-CoV-2/FLU/RSV assay is intended as an aid in  the diagnosis of influenza from Nasopharyngeal swab specimens and  should not be used as a sole basis for treatment. Nasal washings and  aspirates are unacceptable for Xpert Xpress SARS-CoV-2/FLU/RSV  testing.  Fact Sheet for Patients: PinkCheek.be  Fact Sheet for Healthcare Providers: GravelBags.it  This test is not yet approved or cleared by the [place] FDA and  has been authorized for detection and/or diagnosis of SARS-CoV-2 by  FDA under an Emergency Use Authorization (EUA). This EUA will remain  in effect (meaning this test can be used) for the duration of the  Covid-19 declaration under Section 564(b)(1) of the Act, 21  U.S.C. section 360bbb-3(b)(1), unless the authorization is  terminated or revoked. Performed at [facility], [address], [place], [place] [address]       Studies: MR BRAIN WO CONTRAST  Result Date: 07/11/2020 CLINICAL DATA:  Confusion.  Dizziness.  Weakness. EXAM: MRI HEAD WITHOUT CONTRAST TECHNIQUE: Multiplanar, multiecho pulse sequences of the brain and surrounding structures were obtained without intravenous contrast. COMPARISON:  Head CT 07/09/2020  FINDINGS: Brain: Diffusion imaging shows acute or subacute infarction in the deep white matter along the lateral margin of the temporal horn and atrium of the right lateral ventricle. No other acute infarction. Elsewhere, there is an old small vessel infarction in the superior right cerebellum. Old small vessel infarction in the right thalamus. Old small vessel infarction of the corpus callosum in mid body. Moderate chronic small-vessel ischemic changes elsewhere affect the cerebral hemispheric deep and subcortical white matter. No sign of acute hemorrhage, mass lesion, hydrocephalus or extra-axial collection. Vascular: Major vessels at the base of the brain show flow. Skull and upper cervical spine: Negative Sinuses/Orbits: Clear/normal Other: None IMPRESSION: 1. Acute or subacute infarction in the deep white matter along the lateral margin of the temporal horn and atrium of the right  lateral ventricle. No evidence of hemorrhage or mass effect. 2. Moderate chronic small-vessel ischemic changes elsewhere throughout the brain as outlined above. Electronically Signed   By: Nelson Chimes M.D.   On: 07/11/2020 10:27   DG Abd Portable 1V  Result Date: 07/11/2020 CLINICAL DATA:  Diffuse abdominal pain. EXAM: PORTABLE ABDOMEN - 1 VIEW COMPARISON:  None. FINDINGS: The bowel gas pattern is normal. No radio-opaque calculi or other significant radiographic abnormality are seen. IMPRESSION: Negative. Electronically Signed   By: Marijo Conception M.D.   On: 07/11/2020 08:15    Scheduled Meds: . carvedilol  12.5 mg Oral BID WC  . enoxaparin (LOVENOX) injection  40 mg Subcutaneous Q24H  . furosemide  40 mg Intravenous Daily  . insulin aspart  0-15 Units Subcutaneous TID WC  . insulin aspart  0-5 Units Subcutaneous QHS  . rosuvastatin  20 mg Oral q1800  . sacubitril-valsartan  1 tablet Oral BID  . senna-docusate  2 tablet Oral BID  . spironolactone  25 mg Oral Daily  . ticagrelor  90 mg Oral BID    Continuous  Infusions:   LOS: 0 days     Kayleen Memos, MD [facility] Pager [phone]  If 7PM-7AM, please contact night-coverage [email or web address] Password [email or web address] 07/11/2020, 3:24 PM

## 2020-07-12 LAB — BASIC METABOLIC PANEL
Anion gap: 9 (ref 5–15)
BUN: 24 mg/dL — ABNORMAL HIGH (ref 8–23)
CO2: 29 mmol/L (ref 22–32)
Calcium: 8.3 mg/dL — ABNORMAL LOW (ref 8.9–10.3)
Chloride: 100 mmol/L (ref 98–111)
Creatinine, Ser: 1.03 mg/dL (ref 0.61–1.24)
GFR, Estimated: >60 mL/min (ref >60)
Glucose, Bld: 271 mg/dL — ABNORMAL HIGH (ref 70–99)
Potassium: 3.3 mmol/L — ABNORMAL LOW (ref 3.5–5.1)
Sodium: 138 mmol/L (ref 135–145)

## 2020-07-12 LAB — LIPID PANEL
Cholesterol: 146 mg/dL (ref 0–200)
HDL: 41 mg/dL (ref >40)
LDL Cholesterol: 92 mg/dL (ref 0–99)
Total CHOL/HDL Ratio: 3.6 RATIO
Triglycerides: 66 mg/dL (ref <150)
VLDL: 13 mg/dL (ref 0–40)

## 2020-07-12 LAB — GLUCOSE, CAPILLARY
Glucose-Capillary: 238 mg/dL — ABNORMAL HIGH (ref 70–99)
Glucose-Capillary: 270 mg/dL — ABNORMAL HIGH (ref 70–99)
Glucose-Capillary: 168 mg/dL — ABNORMAL HIGH (ref 70–99)
Glucose-Capillary: 261 mg/dL — ABNORMAL HIGH (ref 70–99)

## 2020-07-12 LAB — MAGNESIUM: Magnesium: 1.7 mg/dL (ref 1.7–2.4)

## 2020-07-12 MED ORDER — INSULIN GLARGINE 100 UNIT/ML ~~LOC~~ SOLN
10.0000 [IU] | Freq: Every day | SUBCUTANEOUS | Status: DC
  Administered 2020-07-12: 10 [IU] via SUBCUTANEOUS
  Filled 2020-07-12 (×2): qty 0.1

## 2020-07-12 MED ORDER — MAGNESIUM SULFATE 2 GM/50ML IV SOLN
2.0000 g | Freq: Once | INTRAVENOUS | Status: AC
  Administered 2020-07-12: 2 g via INTRAVENOUS
  Filled 2020-07-12: qty 50

## 2020-07-12 MED ORDER — ASPIRIN EC 81 MG PO TBEC
81.0000 mg | DELAYED_RELEASE_TABLET | Freq: Every day | ORAL | Status: DC
  Administered 2020-07-12 – 2020-07-15 (×4): 81 mg via ORAL
  Filled 2020-07-12 (×4): qty 1

## 2020-07-12 MED ORDER — POTASSIUM CHLORIDE CRYS ER 20 MEQ PO TBCR
40.0000 meq | EXTENDED_RELEASE_TABLET | Freq: Two times a day (BID) | ORAL | Status: AC
  Administered 2020-07-12 – 2020-07-14 (×6): 40 meq via ORAL
  Filled 2020-07-12 (×6): qty 2

## 2020-07-12 MED ORDER — ROSUVASTATIN CALCIUM 20 MG PO TABS
40.0000 mg | ORAL_TABLET | Freq: Every day | ORAL | Status: DC
  Administered 2020-07-12 – 2020-07-18 (×7): 40 mg via ORAL
  Filled 2020-07-12 (×7): qty 2

## 2020-07-12 NOTE — Progress Notes (Signed)
Orthopedic Tech Progress Note Patient Details:  Earl Calderon [birth year] [id number]  Patient ID: [name], male   DOB: 1950-10-02, 69 y.o.   MRN: [id number]   [name] 07/12/2020, 10:42 AM Bi lateral unna wraps applied

## 2020-07-12 NOTE — Progress Notes (Signed)
PROGRESS NOTE  Earl Calderon KGU:[id number] DOB: [birth year] DOA: 07/09/2020 PCP: Pcp, No  HPI/Recap of past 24 hours:  Earl Calderon is a 69 y.o. male with medical history significant of CAD, HFrEF 25-30%, IDDM2. Presenting with increasing dizziness, weakness, leg swelling and dyspnea over the last 3 weeks. He states he first noticed his symptoms 3 weeks ago when his legs felt heavy and he could see them swell. He began noticing that he was more short of breath with activity and he even felt a little dizzy at times with vision loss from 2 weeks worse on the left. He tried herbal medicine and rest; but neither improved his symptoms.  After 3 weeks, he decided to call his PCP. He was told to go to the ED for further evaluation and management.    Of note, he has not taken medicines for DM2, HFrEF, or CAD in over a year. He states that he was trying to remove "all the chemicals" from his body. He thought that all his conditions were resolved.   Due to his reported generalized weakness, fatigue for 3 weeks and vision loss for 2 weeks, initially a CT head 07/09/2020 was done which showed age indeterminate small vessel ischemic changes throughout the periventricular white matter.  Chronic lacunar infarcts left basal ganglia and right thalamus.  No acute hemorrhage.  MRI brain was done on 07/11/2020 which showed acute or subacute infarction in the deep white matter along the lateral margin of the temporal horn and atrium of the right lateral ventricle.  No evidence of hemorrhage or mass-effect.  Moderate chronic small vessel ischemic changes elsewhere throughout the brain.    2D echo completed on 07/10/2020 showed LVEF 30 to 35%, left ventricle global hypokinesis, left atrial severely dilated, right atrial moderately dilated, mild mitral valve regurgitation, grade 2 diastolic dysfunction.  Cardiology has been consulted and will see in consultation on 07/12/2020.  07/12/20: Seen and examined.  No chest  pain.  His dyspnea has improved on IV diuretics.  Net I&O -2.8 L.  Assessment/Plan: Active Problems:   Acute on chronic congestive heart failure (HCC)   Acute on chronic combined systolic (congestive) and diastolic (congestive) heart failure (HCC)  Acute on chronic combined diastolic and systolic CHF 2D echo completed on 07/10/2020 showed LVEF 30 to 35%, left ventricle global hypokinesis, left atrial severely dilated, right atrial moderately dilated, mild mitral valve regurgitation, grade 2 diastolic dysfunction.  Cardiology has been consulted. 2D echo done on 11/12/2018 showed LVEF 25 to 30% Ongoing diuresing with IV Lasix 40 mg twice daily. Net I&O -2.8 L Strict I's and O's and daily weight  Possible cardiac amyloidosis seen on 2D echo Defer management to cardiology  Coronary artery disease status post PCI with stent placement Denies any anginal symptoms at the time of this exam Continue cardiac medications Cardiology consulted  Acute or subacute infarction in the deep white matter along the lateral margin of the temporal horn and atrium of the right lateral ventricle seen on MRI brain 07/11/2020.   No evidence of hemorrhage or mass-effect.  Moderate chronic small vessel ischemic changes elsewhere throughout the brain.   LDL 92, goal less than 70 Hemoglobin A1c 9.2 with a goal of less than 7.0 Increased dose of Crestor to 40 mg daily Added aspirin 81 mg daily No reported intracardiac thrombus or PFO on 2D echo done on 07/10/2020. PT OT to assess Neuro consult  Left eye blurriness/bilateral visual impairment History of CVA seen on CT head and MRI  brain  Type 2 diabetes with hyperglycemia Hemoglobin A1c not at goal Continue insulin sliding scale Continue Lantus Get A1c  Hyperlipidemia LDL is not at goal Crestor dose increased to 40 mg daily.  Chronic normocytic anemia Resume home medications No evidence of overt bleeding  Chronic right lower extremity wound secondary to  skin picking Continue local wound care as recommended by wound care specialist.  Constipation, improving Continue bowel regimen  Medication noncompliance Patient stopped taking his current medication for over a year, stated he wanted to remove all the chemicals from his body.      Code Status: Full code  Family Communication: None at bedside.  Disposition Plan: Likely will discharge to home   Consultants:  Cardiology  Neurology  Procedures:  None  Antimicrobials:  None  DVT prophylaxis: Subcu Lovenox daily  Status is: Inpatient.    Dispo:  Patient From: Home  Planned Disposition: Home  Expected discharge date: 07/13/20  Medically stable for discharge: No, ongoing diuresing and management of combined systolic and diastolic CHF.    Objective: Vitals:   07/11/20 2114 07/12/20 0540 07/12/20 0901 07/12/20 1300  BP: (!) 148/81 (!) 176/94 (!) 163/109 (!) 146/79  Pulse: (!) 58 63 65 (!) 57  Resp: 16 18  18   Temp: 97.9 F (36.6 C) 98.4 F (36.9 C)    TempSrc: Oral Oral    SpO2: 96% 97%  100%  Weight:      Height:        Intake/Output Summary (Last 24 hours) at 07/12/2020 1325 Last data filed at 07/12/2020 0900 Gross per 24 hour  Intake --  Output 375 ml  Net -375 ml   Filed Weights   07/09/20 1741 07/11/20 0551  Weight: 63.5 kg 65.4 kg    Exam:  . General: 69 y.o. year-old male well-developed and nourished in no acute distress.  Alert and oriented x3.   . Cardiovascular: Regular rate and rhythm no rubs or gallops.   Marland Kitchen Respiratory: Clear to auscultation no wheezes or rales.  . Abdomen: Soft nontender normal bowel sounds present.  . Musculoskeletal: Trace lower extremity edema bilaterally.  Marland Kitchen Psychiatry: Mood is appropriate for condition and setting.  Data Reviewed: CBC: Recent Labs  Lab 07/09/20 1752 07/10/20 1116 07/10/20 1310 07/11/20 0532  WBC 5.8 5.7 5.7 4.9  NEUTROABS 4.7 4.4  --   --   HGB 13.7 12.2* 12.4* 12.3*  HCT 41.5  36.1* 38.1* 37.5*  MCV 93.9 94.0 95.0 95.7  PLT 186 164 165 935   Basic Metabolic Panel: Recent Labs  Lab 07/09/20 1752 07/10/20 1116 07/10/20 1310 07/11/20 0532 07/12/20 0510  NA 138 141  --  137 138  K 3.6 3.7  --  3.6 3.3*  CL 98 103  --  101 100  CO2 31 33*  --  30 29  GLUCOSE 416* 221*  --  131* 271*  BUN 14 16  --  26* 24*  CREATININE 0.96 0.93 0.93 1.08 1.03  CALCIUM 8.5* 8.4*  --  8.0* 8.3*  MG  --  1.7  --   --  1.7   GFR: Estimated Creatinine Clearance: 64.4 mL/min (by C-G formula based on SCr of 1.03 mg/dL). Liver Function Tests: Recent Labs  Lab 07/09/20 1752 07/10/20 1116 07/11/20 0532  AST 16 14* 15  ALT 12 9 11   ALKPHOS 110 94 87  BILITOT 1.0 1.0 0.8  PROT 6.9 5.5* 5.9*  ALBUMIN 2.6* 2.1* 2.2*   No results for input(s): LIPASE,  AMYLASE in the last 168 hours. No results for input(s): AMMONIA in the last 168 hours. Coagulation Profile: No results for input(s): INR, PROTIME in the last 168 hours. Cardiac Enzymes: No results for input(s): CKTOTAL, CKMB, CKMBINDEX, TROPONINI in the last 168 hours. BNP (last 3 results) No results for input(s): PROBNP in the last 8760 hours. HbA1C: Recent Labs    07/10/20 1310  HGBA1C 9.2*   CBG: Recent Labs  Lab 07/11/20 1157 07/11/20 1650 07/11/20 2112 07/12/20 0754 07/12/20 1157  GLUCAP 104* 186* 164* 238* 270*   Lipid Profile: Recent Labs    07/12/20 0510  CHOL 146  HDL 41  LDLCALC 92  TRIG 66  CHOLHDL 3.6   Thyroid Function Tests: No results for input(s): TSH, T4TOTAL, FREET4, T3FREE, THYROIDAB in the last 72 hours. Anemia Panel: No results for input(s): VITAMINB12, FOLATE, FERRITIN, TIBC, IRON, RETICCTPCT in the last 72 hours. Urine analysis:    Component Value Date/Time   COLORURINE YELLOW 07/09/2020 2151   APPEARANCEUR CLEAR 07/09/2020 2151   LABSPEC 1.020 07/09/2020 2151   PHURINE 6.5 07/09/2020 2151   GLUCOSEU >=500 (A) 07/09/2020 2151   HGBUR MODERATE (A) 07/09/2020 2151    BILIRUBINUR NEGATIVE 07/09/2020 2151   [place] NEGATIVE 07/09/2020 2151   PROTEINUR 100 (A) 07/09/2020 2151   NITRITE NEGATIVE 07/09/2020 2151   LEUKOCYTESUR NEGATIVE 07/09/2020 2151   Sepsis Labs: @LABRCNTIP (procalcitonin:4,lacticidven:4)  ) Recent Results (from the past 240 hour(s))  Respiratory Panel by RT PCR (Flu A&B, Covid) - Nasopharyngeal Swab     Status: None   Collection Time: 07/09/20  9:51 PM   Specimen: Nasopharyngeal Swab  Result Value Ref Range Status   SARS Coronavirus 2 by RT PCR NEGATIVE NEGATIVE Final    Comment: (NOTE) SARS-CoV-2 target nucleic acids are NOT DETECTED.  The SARS-CoV-2 RNA is generally detectable in upper respiratoy specimens during the acute phase of infection. The lowest concentration of SARS-CoV-2 viral copies this assay can detect is 131 copies/mL. A negative result does not preclude SARS-Cov-2 infection and should not be used as the sole basis for treatment or other patient management decisions. A negative result may occur with  improper specimen collection/handling, submission of specimen other than nasopharyngeal swab, presence of viral mutation(s) within the areas targeted by this assay, and inadequate number of viral copies (<131 copies/mL). A negative result must be combined with clinical observations, patient history, and epidemiological information. The expected result is Negative.  Fact Sheet for Patients:  PinkCheek.be  Fact Sheet for Healthcare Providers:  GravelBags.it  This test is no t yet approved or cleared by the [place] FDA and  has been authorized for detection and/or diagnosis of SARS-CoV-2 by FDA under an Emergency Use Authorization (EUA). This EUA will remain  in effect (meaning this test can be used) for the duration of the COVID-19 declaration under Section 564(b)(1) of the Act, 21 U.S.C. section 360bbb-3(b)(1), unless the authorization is  terminated or revoked sooner.     Influenza A by PCR NEGATIVE NEGATIVE Final   Influenza B by PCR NEGATIVE NEGATIVE Final    Comment: (NOTE) The Xpert Xpress SARS-CoV-2/FLU/RSV assay is intended as an aid in  the diagnosis of influenza from Nasopharyngeal swab specimens and  should not be used as a sole basis for treatment. Nasal washings and  aspirates are unacceptable for Xpert Xpress SARS-CoV-2/FLU/RSV  testing.  Fact Sheet for Patients: PinkCheek.be  Fact Sheet for Healthcare Providers: GravelBags.it  This test is not yet approved or cleared by the [place]  States FDA and  has been authorized for detection and/or diagnosis of SARS-CoV-2 by  FDA under an Emergency Use Authorization (EUA). This EUA will remain  in effect (meaning this test can be used) for the duration of the  Covid-19 declaration under Section 564(b)(1) of the Act, 21  U.S.C. section 360bbb-3(b)(1), unless the authorization is  terminated or revoked. Performed at [facility], [address] [place], [place] [address]       Studies: No results found.  Scheduled Meds: . aspirin EC  81 mg Oral Daily  . carvedilol  12.5 mg Oral BID WC  . enoxaparin (LOVENOX) injection  40 mg Subcutaneous Q24H  . furosemide  40 mg Intravenous BID  . insulin aspart  0-15 Units Subcutaneous TID WC  . insulin aspart  0-5 Units Subcutaneous QHS  . potassium chloride  40 mEq Oral BID  . rosuvastatin  40 mg Oral q1800  . sacubitril-valsartan  1 tablet Oral BID  . senna-docusate  2 tablet Oral BID  . spironolactone  25 mg Oral Daily  . ticagrelor  90 mg Oral BID    Continuous Infusions: . magnesium sulfate bolus IVPB       LOS: 1 day     Kayleen Memos, MD [facility] Pager [phone]  If 7PM-7AM, please contact night-coverage [email or web address] Password [email or web address] 07/12/2020, 1:25 PM

## 2020-07-12 NOTE — Progress Notes (Signed)
Telemetry called this RN and stated that patient was having more ST elevation then previously. RN checked on patient, patient is asymptomatic and vitals are within normal limits. MD notified.

## 2020-07-13 ENCOUNTER — Inpatient Hospital Stay (HOSPITAL_COMMUNITY): Payer: Medicare Other

## 2020-07-13 DIAGNOSIS — I5043 Acute on chronic combined systolic (congestive) and diastolic (congestive) heart failure: Secondary | ICD-10-CM | POA: Diagnosis not present

## 2020-07-13 LAB — BASIC METABOLIC PANEL
Anion gap: 7 (ref 5–15)
BUN: 27 mg/dL — ABNORMAL HIGH (ref 8–23)
CO2: 31 mmol/L (ref 22–32)
Calcium: 8.4 mg/dL — ABNORMAL LOW (ref 8.9–10.3)
Chloride: 103 mmol/L (ref 98–111)
Creatinine, Ser: 0.99 mg/dL (ref 0.61–1.24)
GFR, Estimated: >60 mL/min (ref >60)
Glucose, Bld: 253 mg/dL — ABNORMAL HIGH (ref 70–99)
Potassium: 3.5 mmol/L (ref 3.5–5.1)
Sodium: 141 mmol/L (ref 135–145)

## 2020-07-13 LAB — GLUCOSE, CAPILLARY
Glucose-Capillary: 188 mg/dL — ABNORMAL HIGH (ref 70–99)
Glucose-Capillary: 109 mg/dL — ABNORMAL HIGH (ref 70–99)
Glucose-Capillary: 200 mg/dL — ABNORMAL HIGH (ref 70–99)
Glucose-Capillary: 210 mg/dL — ABNORMAL HIGH (ref 70–99)

## 2020-07-13 MED ORDER — GADOBUTROL 1 MMOL/ML IV SOLN
7.0000 mL | Freq: Once | INTRAVENOUS | Status: AC | PRN
  Administered 2020-07-13: 7 mL via INTRAVENOUS

## 2020-07-13 MED ORDER — INSULIN GLARGINE 100 UNIT/ML ~~LOC~~ SOLN
10.0000 [IU] | Freq: Two times a day (BID) | SUBCUTANEOUS | Status: DC
  Administered 2020-07-13 – 2020-07-19 (×13): 10 [IU] via SUBCUTANEOUS
  Filled 2020-07-13 (×13): qty 0.1

## 2020-07-13 NOTE — Consult Note (Signed)
Cardiology Consultation:   Patient ID: Earl Calderon; [id number]; 1950-09-16   Admit date: 07/09/2020 Date of Consult: 07/13/2020  Primary Care Provider: Pcp, No Primary Cardiologist: Dr. [name], MD   Patient Profile:   Earl Calderon is a 69 y.o. male with a hx of DM2 and hypertension who is being seen today for the evaluation of CHF at the request of Dr. Nevada Crane.  History of Present Illness:   Earl Calderon is a 69 year old male with a history as stated above who presented to [facility] 07/09/2020 for dyspnea, fatigue and lower extremity edema. Patient reported increasing dizziness, weakness and leg swelling along with dyspnea for 3 weeks prior to presentation.  He reported progressive shortness of breath with activity.  He tried herbal medicines and rest however this did not improve his symptoms.  Given this, he called his PCP who recommended that he proceed to the ED for further evaluation.  Patient also reported visual changes therefore head CT performed on presentation which showed small vessel ischemic with chronic lacunar infarcts left basal ganglia and right thalamus and no acute hemorrhage.  Subsequent MRI performed 07/11/2020 which showed acute or subacute infarction in the deep white matter along the lateral margin of the temporal horn and atrium of the right lateral ventricle.    Of note, patient has not taken medications for cardiomyopathy, CAD or DM 2 in over a year.  He wished to "remove all chemicals from his body".   Echocardiogram performed this admission shows an LVEF at 30 to 35% with global hypokinesis and moderate concentric left ventricular hypertrophy with worsening diastolic dysfunction, mild MR, moderate TR>> noting that images show worsening left ventricular diastolic function when compared to prior study with consideration for cardiac amyloidosis. He has been treated with IV Lasix 40 mg twice daily with good urine output.  He was last seen by our service  11/13/2018 during hospital consultation for the evaluation of newly found reduced EF at 25 to 30% with moderately increased LV wall thickness with some concern for cardiac amyloidosis and uncontrolled hypertension.  EKG showed new left bundle branch block therefore coronary evaluation was indicated.  He underwent a right and left heart cath on 11/15/2018 which showed a 90% mid LAD lesion at which time PCI/DES was placed with plans for DAPT and aggressive secondary prevention. At that time, he had no symptoms of chest pain or pressure, no shortness of breath, dizziness, lightheadedness or syncope.  He was diuresed with IV Lasix. Dry weight noted to be 65 kg.  He was started on Lasix 20 mg p.o. daily and losartan was transitioned to [place] 24/26 twice daily.  He had episodes of NSVT therefore carvedilol was increased to 12.5 mg twice daily.  He was continued on spironolactone with plans to repeat echocardiogram in 3 months.  Unfortunately, we have not seen the patient since that time.  Past Medical History:  Diagnosis Date  . Coronary artery disease   . Diabetes mellitus without complication ([place])   . Hernia, inguinal   . Hypertension   . Uncontrolled type 2 diabetes mellitus ([place]) 11/11/2018    Past Surgical History:  Procedure Laterality Date  . CORONARY STENT INTERVENTION N/A 11/15/2018   Procedure: CORONARY STENT INTERVENTION;  Surgeon: [name], MD;  Location: [facility];  Service: Cardiovascular;  Laterality: N/A;  . HEMORROIDECTOMY    . RIGHT/LEFT HEART CATH AND CORONARY ANGIOGRAPHY N/A 11/15/2018   Procedure: RIGHT/LEFT HEART CATH AND CORONARY ANGIOGRAPHY;  Surgeon: Larae Grooms  S, MD;  Location: [place] [facility];  Service: Cardiovascular;  Laterality: N/A;     Prior to Admission medications   Medication Sig Start Date End Date Taking? Authorizing Provider  blood glucose meter kit and supplies KIT Dispense based on patient and insurance preference. Use up to four  times daily as directed. (FOR ICD-9 250.00, 250.01). 11/17/18   Florencia Reasons, MD  carvedilol (COREG) 12.5 MG tablet Take 1 tablet (12.5 mg total) by mouth 2 (two) times daily with a meal. Patient not taking: Reported on 07/10/2020 11/17/18   Florencia Reasons, MD  furosemide (LASIX) 20 MG tablet Take 1 tablet (20 mg total) by mouth daily. Patient not taking: Reported on 07/10/2020 11/18/18   Florencia Reasons, MD  insulin aspart (NOVOLOG FLEXPEN) 100 UNIT/ML FlexPen Before each meal 3 times a day, 140-199 - 2 units, 200-250 - 4 units, 251-299 - 6 units,  300-349 - 8 units,  350 or above 10 units. Insulin PEN if approved, provide syringes and needles if needed. Patient not taking: Reported on 07/10/2020 11/17/18   Florencia Reasons, MD  insulin detemir (LEVEMIR) 100 unit/ml SOLN Inject 0.08 mLs (8 Units total) into the skin 2 (two) times daily. Patient not taking: Reported on 07/10/2020 11/17/18   Florencia Reasons, MD  Insulin Pen Needle (PEN NEEDLES 29GX1/2") 29G X 12MM MISC Diabetes pen needles for a month supply 11/17/18   Florencia Reasons, MD  rosuvastatin (CRESTOR) 20 MG tablet Take 1 tablet (20 mg total) by mouth daily at 6 PM. Patient not taking: Reported on 07/10/2020 11/17/18   Florencia Reasons, MD  sacubitril-valsartan (ENTRESTO) 24-26 MG Take 1 tablet by mouth 2 (two) times daily. Patient not taking: Reported on 07/10/2020 11/17/18   Florencia Reasons, MD  spironolactone (ALDACTONE) 25 MG tablet Take 1 tablet (25 mg total) by mouth daily. Patient not taking: Reported on 07/10/2020 11/18/18   Florencia Reasons, MD  ticagrelor (BRILINTA) 90 MG TABS tablet Take 1 tablet (90 mg total) by mouth 2 (two) times daily. Patient not taking: Reported on 07/10/2020 11/17/18   Florencia Reasons, MD    Inpatient Medications: Scheduled Meds: . aspirin EC  81 mg Oral Daily  . carvedilol  12.5 mg Oral BID WC  . enoxaparin (LOVENOX) injection  40 mg Subcutaneous Q24H  . furosemide  40 mg Intravenous BID  . insulin aspart  0-15 Units Subcutaneous TID WC  . insulin aspart  0-5 Units Subcutaneous  QHS  . insulin glargine  10 Units Subcutaneous BID  . potassium chloride  40 mEq Oral BID  . rosuvastatin  40 mg Oral q1800  . sacubitril-valsartan  1 tablet Oral BID  . senna-docusate  2 tablet Oral BID  . spironolactone  25 mg Oral Daily  . ticagrelor  90 mg Oral BID   Continuous Infusions:  PRN Meds: acetaminophen **OR** acetaminophen, hydrALAZINE, ondansetron **OR** ondansetron (ZOFRAN) IV  Allergies:   No Known Allergies  Social History:   Social History   Socioeconomic History  . Marital status: Single    Spouse name: Not on file  . Number of children: Not on file  . Years of education: Not on file  . Highest education level: Not on file  Occupational History  . Not on file  Tobacco Use  . Smoking status: Never Smoker  . Smokeless tobacco: Never Used  Vaping Use  . Vaping Use: Never used  Substance and Sexual Activity  . Alcohol use: Never  . Drug use: Never  . Sexual activity: Not  on file  Other Topics Concern  . Not on file  Social History Narrative  . Not on file   Social Determinants of Health   Financial Resource Strain:   . Difficulty of Paying Living Expenses: Not on file  Food Insecurity:   . Worried About Charity fundraiser in the Last Year: Not on file  . Ran Out of Food in the Last Year: Not on file  Transportation Needs:   . Lack of Transportation (Medical): Not on file  . Lack of Transportation (Non-Medical): Not on file  Physical Activity:   . Days of Exercise per Week: Not on file  . Minutes of Exercise per Session: Not on file  Stress:   . Feeling of Stress : Not on file  Social Connections:   . Frequency of Communication with Friends and Family: Not on file  . Frequency of Social Gatherings with Friends and Family: Not on file  . Attends Religious Services: Not on file  . Active Member of Clubs or Organizations: Not on file  . Attends Archivist Meetings: Not on file  . Marital Status: Not on file  Intimate Partner  Violence:   . Fear of Current or Ex-Partner: Not on file  . Emotionally Abused: Not on file  . Physically Abused: Not on file  . Sexually Abused: Not on file    Family History:   Family History  Problem Relation Age of Onset  . Hypertension Mother   . Diabetes Mellitus II Mother   . Arrhythmia Mother   . Heart disease Mother   . Hypertension Sister   . Hypertension Brother   . Hypertension Other   . Diabetes Mellitus II Other        All 9 sibblings have HTN   Family Status:  Family Status  Relation Name Status  . Mother  (Not Specified)  . Sister  (Not Specified)  . Brother  (Not Specified)  . Other  (Not Specified)    ROS:  Please see the history of present illness.  All other ROS reviewed and negative.     Physical Exam/Data:   Vitals:   07/12/20 1342 07/12/20 2107 07/13/20 0556 07/13/20 1205  BP: (!) 160/80 (!) 175/96 (!) 152/82 128/89  Pulse: (!) 54 60 69 60  Resp: 14 18 18 18   Temp: 98.2 F (36.8 C) 97.6 F (36.4 C) 98.8 F (37.1 C) 97.9 F (36.6 C)  TempSrc: Oral Oral Oral Oral  SpO2: 95% 98% 94% 100%  Weight:      Height:        Intake/Output Summary (Last 24 hours) at 07/13/2020 1553 Last data filed at 07/13/2020 2549 Gross per 24 hour  Intake --  Output 650 ml  Net -650 ml   Filed Weights   07/09/20 1741 07/11/20 0551  Weight: 63.5 kg 65.4 kg   Body mass index is 20.69 kg/m.   See below  EKG:  The EKG was personally reviewed and demonstrates:  07/12/20 SB with HR 56bpm with LBBB (known) Telemetry:  Telemetry was personally reviewed and demonstrates:  NSR  Relevant CV Studies:  Right and left cardiac catheterization 11/15/2018:   Prox LAD to Mid LAD lesion is 40% stenosed.  Mid Cx to Dist Cx lesion is 25% stenosed.  Ost 2nd Diag to 2nd Diag lesion is 50% stenosed.  Ost 3rd Mrg lesion is 25% stenosed.  Mid LAD lesion is 90% stenosed.  A drug-eluting stent was successfully placed using a STENT SYNERGY  DES 2.5X16.  Post  intervention, there is a 0% residual stenosis.  LV end diastolic pressure is normal.  There is no aortic valve stenosis.  Ao 92%, PA 66%, mean PA 17 mm Hg, mean PCWP 3 mm Hg, CO 5.1; CI 2.8- normal right heart pressures  Tortuous right subclavian. 3DRC was used for RCA.   Continue aggressive secondary prevention in addition to dual antiplatelet therapy.   Adequate diuresis based on right heart pressures.  Continue medical therapy for heart failure.  Echocardiogram 11/12/2018:  1. The left ventricle has severely reduced systolic function, with an  ejection fraction of 25-30%. The cavity size was normal. There is  moderately increased left ventricular wall thickness. Cardiac amyloidosis  is a consideration based on the appearance  of the LV myocardium. Left ventricular diastolic Doppler parameters are  consistent with impaired relaxation. Left ventricular diffuse hypokinesis.  2. The right ventricle has mildly reduced systolic function. The cavity  was normal. There is mildly increased right ventricular wall thickness.  3. Left atrial size was severely dilated.  4. Right atrial size was mildly dilated.  5. Moderate pleural effusion in the left lateral region.  6. Trivial pericardial effusion is present.  7. No evidence of mitral valve stenosis. Trivial mitral regurgitation.  8. The aortic valve is tricuspid Mild calcification of the aortic valve.  no stenosis of the aortic valve.  9. The aortic root and ascending aorta are normal in size and structure.  10. The inferior vena cava was normal in size with <50% respiratory  variability. PA systolic pressure 41 mmHg.   Echocardiogram 07/10/2020:  1. Left ventricular ejection fraction, by estimation, is 30 to 35%. The  left ventricle has moderately decreased function. The left ventricle  demonstrates global hypokinesis. There is moderate concentric left  ventricular hypertrophy. Left ventricular  diastolic parameters are  consistent with Grade II diastolic dysfunction  (pseudonormalization). Elevated left atrial pressure.  2. Right ventricular systolic function is moderately reduced. The right  ventricular size is mildly enlarged. Mildly increased right ventricular  wall thickness. There is moderately elevated pulmonary artery systolic  pressure. The estimated right  ventricular systolic pressure is 03.4 mmHg.  3. Left atrial size was severely dilated.  4. Right atrial size was moderately dilated.  5. The mitral valve is normal in structure. Mild mitral valve  regurgitation.  6. Tricuspid valve regurgitation is moderate.  7. The aortic valve is tricuspid. Aortic valve regurgitation is not  visualized. No aortic stenosis is present.  8. The inferior vena cava is dilated in size with <50% respiratory  variability, suggesting right atrial pressure of 15 mmHg.   Comparison(s): Prior images reviewed side by side. The left ventricular  diastolic function is significantly worse. (worsened signs of increased  filling pressures). Consider cardiac amyloidosis.   Laboratory Data:  Chemistry Recent Labs  Lab 07/11/20 0532 07/12/20 0510 07/13/20 0509  NA 137 138 141  K 3.6 3.3* 3.5  CL 101 100 103  CO2 30 29 31   GLUCOSE 131* 271* 253*  BUN 26* 24* 27*  CREATININE 1.08 1.03 0.99  CALCIUM 8.0* 8.3* 8.4*  GFRNONAA >60 >60 >60  ANIONGAP 6 9 7     Total Protein  Date Value Ref Range Status  07/11/2020 5.9 (L) 6.5 - 8.1 g/dL Final   Albumin  Date Value Ref Range Status  07/11/2020 2.2 (L) 3.5 - 5.0 g/dL Final   AST  Date Value Ref Range Status  07/11/2020 15 15 - 41 U/L Final  ALT  Date Value Ref Range Status  07/11/2020 11 0 - 44 U/L Final   Alkaline Phosphatase  Date Value Ref Range Status  07/11/2020 87 38 - 126 U/L Final   Total Bilirubin  Date Value Ref Range Status  07/11/2020 0.8 0.3 - 1.2 mg/dL Final   Hematology Recent Labs  Lab 07/10/20 1116 07/10/20 1310  07/11/20 0532  WBC 5.7 5.7 4.9  RBC 3.84* 4.01* 3.92*  HGB 12.2* 12.4* 12.3*  HCT 36.1* 38.1* 37.5*  MCV 94.0 95.0 95.7  MCH 31.8 30.9 31.4  MCHC 33.8 32.5 32.8  RDW 13.0 13.1 13.0  PLT 164 165 180   Cardiac EnzymesNo results for input(s): TROPONINI in the last 168 hours. No results for input(s): TROPIPOC in the last 168 hours.  BNP Recent Labs  Lab 07/09/20 1752  BNP 985.7*    DDimer No results for input(s): DDIMER in the last 168 hours. TSH:  Lab Results  Component Value Date   TSH 2.755 11/11/2018   Lipids: Lab Results  Component Value Date   CHOL 146 07/12/2020   HDL 41 07/12/2020   LDLCALC 92 07/12/2020   TRIG 66 07/12/2020   CHOLHDL 3.6 07/12/2020   HgbA1c: Lab Results  Component Value Date   HGBA1C 9.2 (H) 07/10/2020    Radiology/Studies:  CT Head Wo Contrast  Result Date: 07/09/2020 CLINICAL DATA:  Generalized weakness, fatigue for 3 weeks, vision loss for 2 weeks EXAM: CT HEAD WITHOUT CONTRAST TECHNIQUE: Contiguous axial images were obtained from the base of the skull through the vertex without intravenous contrast. COMPARISON:  None. FINDINGS: Brain: Focal hypodensities in the left basal ganglia and right thalamus consistent with chronic lacunar infarcts. Scattered hypodensities throughout the periventricular white matter are consistent with age-indeterminate small vessel ischemic changes. No evidence of hemorrhage. Lateral ventricles and remaining midline structures are unremarkable. No acute extra-axial fluid collections. No mass effect. Vascular: No hyperdense vessel or unexpected calcification. Skull: Normal. Negative for fracture or focal lesion. Sinuses/Orbits: No acute finding. Other: None. IMPRESSION: 1. Age-indeterminate small-vessel ischemic changes throughout the periventricular white matter. 2. Chronic lacunar infarcts left basal ganglia and right thalamus. 3. No acute hemorrhage. Electronically Signed   By: Randa Ngo M.D.   On: 07/09/2020 18:09    MR BRAIN WO CONTRAST  Result Date: 07/11/2020 CLINICAL DATA:  Confusion.  Dizziness.  Weakness. EXAM: MRI HEAD WITHOUT CONTRAST TECHNIQUE: Multiplanar, multiecho pulse sequences of the brain and surrounding structures were obtained without intravenous contrast. COMPARISON:  Head CT 07/09/2020 FINDINGS: Brain: Diffusion imaging shows acute or subacute infarction in the deep white matter along the lateral margin of the temporal horn and atrium of the right lateral ventricle. No other acute infarction. Elsewhere, there is an old small vessel infarction in the superior right cerebellum. Old small vessel infarction in the right thalamus. Old small vessel infarction of the corpus callosum in mid body. Moderate chronic small-vessel ischemic changes elsewhere affect the cerebral hemispheric deep and subcortical white matter. No sign of acute hemorrhage, mass lesion, hydrocephalus or extra-axial collection. Vascular: Major vessels at the base of the brain show flow. Skull and upper cervical spine: Negative Sinuses/Orbits: Clear/normal Other: None IMPRESSION: 1. Acute or subacute infarction in the deep white matter along the lateral margin of the temporal horn and atrium of the right lateral ventricle. No evidence of hemorrhage or mass effect. 2. Moderate chronic small-vessel ischemic changes elsewhere throughout the brain as outlined above. Electronically Signed   By: Nelson Chimes M.D.   On:  07/11/2020 10:27   DG Chest Portable 1 View  Result Date: 07/09/2020 CLINICAL DATA:  Shortness of breath EXAM: PORTABLE CHEST 1 VIEW COMPARISON:  None. FINDINGS: The heart size and mediastinal contours are mildly enlarged. There is prominence of the central pulmonary vasculature. Elevation of the right hemidiaphragm is seen. IMPRESSION: Mild cardiomegaly and pulmonary vascular congestion Electronically Signed   By: Prudencio Pair M.D.   On: 07/09/2020 23:43   DG Abd Portable 1V  Result Date: 07/11/2020 CLINICAL DATA:   Diffuse abdominal pain. EXAM: PORTABLE ABDOMEN - 1 VIEW COMPARISON:  None. FINDINGS: The bowel gas pattern is normal. No radio-opaque calculi or other significant radiographic abnormality are seen. IMPRESSION: Negative. Electronically Signed   By: Marijo Conception M.D.   On: 07/11/2020 08:15   ECHOCARDIOGRAM COMPLETE  Result Date: 07/10/2020    ECHOCARDIOGRAM REPORT   Patient Name:   Earl Calderon Date of Exam: 07/10/2020 Medical Rec #:  [id number]         Height:       70.0 in Accession #:    [id number]        Weight:       140.0 lb Date of Birth:  13-Apr-1951         BSA:          1.794 m Patient Age:    28 years          BP:           150/73 mmHg Patient Gender: M                 HR:           60 bpm. Exam Location:  Inpatient Procedure: 2D Echo, Cardiac Doppler and Color Doppler Indications:    I50.33 Acute on chronic diastolic (congestive) heart failure  History:        Patient has prior history of Echocardiogram examinations, most                 recent 11/12/2018. CAD; Risk Factors:Hypertension and Diabetes.  Sonographer:    Tiffany Dance Referring Phys: [id number] [place]  1. Left ventricular ejection fraction, by estimation, is 30 to 35%. The left ventricle has moderately decreased function. The left ventricle demonstrates global hypokinesis. There is moderate concentric left ventricular hypertrophy. Left ventricular diastolic parameters are consistent with Grade II diastolic dysfunction (pseudonormalization). Elevated left atrial pressure.  2. Right ventricular systolic function is moderately reduced. The right ventricular size is mildly enlarged. Mildly increased right ventricular wall thickness. There is moderately elevated pulmonary artery systolic pressure. The estimated right ventricular systolic pressure is 58.8 mmHg.  3. Left atrial size was severely dilated.  4. Right atrial size was moderately dilated.  5. The mitral valve is normal in structure. Mild mitral valve regurgitation.   6. Tricuspid valve regurgitation is moderate.  7. The aortic valve is tricuspid. Aortic valve regurgitation is not visualized. No aortic stenosis is present.  8. The inferior vena cava is dilated in size with <50% respiratory variability, suggesting right atrial pressure of 15 mmHg. Comparison(s): Prior images reviewed side by side. The left ventricular diastolic function is significantly worse. (worsened signs of increased filling pressures). Consider cardiac amyloidosis. FINDINGS  Left Ventricle: Left ventricular ejection fraction, by estimation, is 30 to 35%. The left ventricle has moderately decreased function. The left ventricle demonstrates global hypokinesis. The left ventricular internal cavity size was normal in size. There is moderate concentric left ventricular hypertrophy. Left ventricular diastolic  parameters are consistent with Grade II diastolic dysfunction (pseudonormalization). Elevated left atrial pressure. Right Ventricle: The right ventricular size is mildly enlarged. Mildly increased right ventricular wall thickness. Right ventricular systolic function is moderately reduced. There is moderately elevated pulmonary artery systolic pressure. The tricuspid regurgitant velocity is 3.10 m/s, and with an assumed right atrial pressure of 15 mmHg, the estimated right ventricular systolic pressure is 35.6 mmHg. Left Atrium: Left atrial size was severely dilated. Right Atrium: Right atrial size was moderately dilated. Pericardium: Trivial pericardial effusion is present. Mitral Valve: The mitral valve is normal in structure. Mild mitral annular calcification. Mild mitral valve regurgitation, with centrally-directed jet. Tricuspid Valve: The tricuspid valve is normal in structure. Tricuspid valve regurgitation is moderate. Aortic Valve: The aortic valve is tricuspid. Aortic valve regurgitation is not visualized. No aortic stenosis is present. Pulmonic Valve: The pulmonic valve was normal in structure.  Pulmonic valve regurgitation is trivial. Aorta: The aortic root and ascending aorta are structurally normal, with no evidence of dilitation. Venous: The inferior vena cava is dilated in size with less than 50% respiratory variability, suggesting right atrial pressure of 15 mmHg. IAS/Shunts: No atrial level shunt detected by color flow Doppler.  LEFT VENTRICLE PLAX 2D LVIDd:         4.70 cm  Diastology LVIDs:         3.90 cm  LV e' medial:    2.76 cm/s LV PW:         1.50 cm  LV E/e' medial:  33.1 LV IVS:        1.40 cm  LV e' lateral:   5.84 cm/s LVOT diam:     1.90 cm  LV E/e' lateral: 15.6 LV SV:         42 LV SV Index:   23 LVOT Area:     2.84 cm  RIGHT VENTRICLE            IVC RV Basal diam:  3.90 cm    IVC diam: 2.60 cm RV Mid diam:    2.00 cm RV S prime:     6.57 cm/s TAPSE (M-mode): 1.8 cm LEFT ATRIUM              Index       RIGHT ATRIUM           Index LA diam:        5.30 cm  2.95 cm/m  RA Area:     25.60 cm LA Vol (A2C):   118.0 ml 65.78 ml/m RA Volume:   77.10 ml  42.98 ml/m LA Vol (A4C):   74.7 ml  41.65 ml/m LA Biplane Vol: 96.6 ml  53.85 ml/m  AORTIC VALVE LVOT Vmax:   78.30 cm/s LVOT Vmean:  48.800 cm/s LVOT VTI:    0.148 m  AORTA Ao Root diam: 3.70 cm Ao Asc diam:  3.10 cm MITRAL VALVE               TRICUSPID VALVE MV Area (PHT): 3.27 cm    TR Peak grad:   38.4 mmHg MV Decel Time: 232 msec    TR Vmax:        310.00 cm/s MV E velocity: 91.30 cm/s MV A velocity: 63.60 cm/s  SHUNTS MV E/A ratio:  1.44        Systemic VTI:  0.15 m  Systemic Diam: 1.90 cm Sanda Klein MD Electronically signed by Sanda Klein MD Signature Date/Time: 07/10/2020/3:02:15 PM    Final     Assessment and Plan:   1.  Acute on chronic systolic and diastolic CHF: -Patient has been previously followed by our service 10/2018 for the evaluation of severely reduced LV function at 25 to 30% with possible cardiac amyloidosis. He underwent an LHC which showed 90% mid LAD lesion treated with  DES/PCI placement with plans for DAPT for 1 year.  He was treated with p.o. Lasix and transitioned to [facility] 24/26 twice daily along with carvedilol and spironolactone with plans for follow-up echocardiogram in 3 months. Unfortunately, we have not seen this patient since that time. -He then presented with fatigue, weakness, dyspnea and LE edema along with dizziness found to have acute or subacute infarct on CT/MRI with no hemorrhage or mass-effect.  Echocardiogram showed an LVEF at 30 to 35% with worsening diastolic function with consideration for cardiac amyloidosis as above -Weight, 144lb (65.4kg) -I&O, net negative 3.4L  -Continue Entresto 24/26, carvedilol 12.5, spironolactone 25 -Continue IV Lasix 40 BID  -Creatinine stable at 0.99 -BP tolerating low dose Entresto>>would plan in up-titrating to 49/51 dosing tomorrow   2.  Acute or subacute infarct on head CT: -No evidence of hemorrhage or mass-effect with chronic small vessel ischemic changes -Started on ASA, statin therapy  -Plan to transfer to [facility] for neurology/stroke team evaluation   3.  CAD s/p DES/PCI 10/2018: -Given LBBB and reduced LV function on last cardiology consultation 10/2018, patient underwent right and left cardiac catheterization 11/15/2018 which showed a 90% Miller LAD lesion treated with stenting with plans for DAPT for 1 year however patient has not taken any cardiac or DM medications in over a year  -Denies anginal symptoms -Restart ASA, statin, beta-blocker  4.  HLD: -LDL, 92 with LDL goal <70 -Previously on rosuvastatin 20 mg at last cardiology consultation however has been off all medications in over a year -Crestor increased to 40 mg p.o. daily -Recheck in 6-8 weeks with LFTs   5.  DM2: -Uncontrolled, Hemoglobin A1c, 9.2 -SSI for glucose control -Management per IM -Consider SGLT2 or Farxiga given poorly controlled DM and CAD   For questions or updates, please contact [facility] Please consult  [email or web address] for contact info under Cardiology/STEMI.   Lyndel Safe NP-C [facility] Pager: [phone] 07/13/2020 3:53 PM   History and all data above reviewed.  Patient examined.  I agree with the findings as above.    The patient is an interesting fellow who has been noncompliant with his medications.  He stopped taking them because he was suspicious of medications as he thinks his wife is been over medicated with psych meds.  He has a history as above.  He finally presented to be seen in the emergency room when he had increased lower extremity swelling.  He has had weight gain though he could not quantify this.  He was also getting coughing spells when he would try to lie flat.  He denies any chest pressure, neck or arm discomfort.  He denies any palpitations, presyncope or syncope.  He vaguely remembers seeing a cardiologist but not recently.  He had seen Dr. Oval Linsey.    The patient exam reveals  GENERAL:  Well appearing HEENT:  Pupils equal round and reactive, fundi not visualized, oral mucosa unremarkable NECK: Positive jugular venous distention, waveform within normal limits, carotid upstroke brisk and symmetric, no bruits, no thyromegaly LYMPHATICS:  No cervical,  inguinal adenopathy LUNGS:  Clear to auscultation bilaterally BACK:  No CVA tenderness CHEST:  Unremarkable HEART:  PMI not displaced or sustained,S1 and S2 within normal limits, no S3, no S4, no clicks, no rubs, no murmurs ABD:  Flat, positive bowel sounds normal in frequency in pitch, no bruits, no rebound, no guarding, no midline pulsatile mass, no hepatomegaly, no splenomegaly EXT:  2 plus pulses throughout, moderate bilateral lower extremity edema, no cyanosis no clubbing SKIN:  No rashes no nodules NEURO:  Cranial nerves II through XII grossly intact, motor grossly intact throughout PSYCH:  Cognitively intact, oriented to person place and time   All available labs, radiology testing, previous records  reviewed. Agree with documented assessment and plan.  ACUTE ON CHRONIC SYSTOLIC HF: The patient has been off of all of his medications.  He is volume overloaded.  I agree with IV diuresis.  He is net -3 L so far.  He still has significant volume overload.  I agree with restarting his beta-blocker and his Entresto.  He has also been restarted on spironolactone.  I talked about the importance of compliance.  He says he is good with salt management.  We will reestablish him in cardiology clinic.  CAD: The patient is not having any angina.  He does not have a diagnostic troponin trend to suggest any acute coronary syndrome.  Of note he is well beyond the year with his stent 12 think there is an indication to try to restart the ticagrelor which he was not taking.  I will stop this but will continue aspirin.  DM: A1c is 9.2.  Resume meds per primary team.  DYSLIPIDEMIA: LDL is surprisingly not markedly above target and Crestor has been resumed.     Jeneen Rinks Lakasha Mcfall  4:38 PM  07/13/2020

## 2020-07-13 NOTE — Progress Notes (Signed)
Poor MRA of the COW and Carotids completed today. Patient states he was given a diuretic sometime today. Patient terminated the procedure due to the urge to urinate.

## 2020-07-13 NOTE — Evaluation (Addendum)
Occupational Therapy Evaluation Patient Details Name: Earl Calderon MRN: [id number] DOB: 12/22/1950 Today's Date: 07/13/2020    History of Present Illness Earl Calderon is a 69 y.o. male with medical history significant of CAD, HFrEF 25-30%, IDDM2. Presenting with increasing dizziness, weakness, leg swelling and dyspnea over the last 3 weeks. He states he first noticed his symptoms 3 weeks ago when his legs felt heavy and he could see them swell. He began noticing that he was more short of breath with activity and he even felt a little dizzy at times with vision loss from 2 weeks worse on the left. He tried herbal medicine and rest; but neither improved his symptoms.  After 3 weeks, he decided to call his PCP. He was told to go to the ED for further evaluation and management. Admitted for acute on chronic CHF.   Clinical Impression   Earl Calderon is a 69 year old man who presents with functional ROM, strength and coordination of upper extremities. No reports of motor or sensation deficits. Exhibits ability to perform finger to nose, opposition of fingers and use upper extremities as needed for ADL tasks. Patient reports blurriness of vision in the last two weeks. Exhibits functional visual tracking, saccades and visual fields. Patient able to read the clock and locate therapist fingers and count them in all four visual quadrants. Patient able to read large lettering with right eye but not with left - reports using readers at home. Visual changes appear acuity related in nature. Recommended follow up with opthalmologist. No OT needs at this time.    Follow Up Recommendations  No OT follow up    Equipment Recommendations  None recommended by OT    Recommendations for Other Services       Precautions / Restrictions Precautions Precautions: None      Mobility Bed Mobility Overal bed mobility: Independent             General bed mobility comments: seated at side of bed  when therapist entered room.    Transfers Overall transfer level: Modified independent               General transfer comment: Has been ambulating in room without nursing assistance. Uses cane at baseline. Used hand holds to walk to bathroom as needed.    Balance Overall balance assessment: Mild deficits observed, not formally tested                                         ADL either performed or assessed with clinical judgement   ADL Overall ADL's : At baseline                                       General ADL Comments: Demonstrates ability to don socks, perform toileting independently, ambulate in room without device. No deficis reported.     Vision Patient Visual Report: Blurring of vision Vision Assessment?: Yes Eye Alignment: Within Functional Limits Alignment/Gaze Preference: Within Defined Limits Tracking/Visual Pursuits: Able to track stimulus in all quads without difficulty Saccades: Within functional limits Convergence: Within functional limits Visual Fields: No apparent deficits Additional Comments: Reports increased blurriness in the last two weeks. More so in left eye. Able to grossly read large lettering with Right eye but not left. Reports using readers at home.  Perception     Praxis      Pertinent Vitals/Pain Pain Assessment: No/denies pain     Hand Dominance Right   Extremity/Trunk Assessment Upper Extremity Assessment Upper Extremity Assessment: RUE deficits/detail;LUE deficits/detail RUE Deficits / Details: Shoulder 4/5, elbow 4+/5, wrist 5/5, grip 5/5 RUE Sensation: WNL (Reports numbness in 5th digit) RUE Coordination: WNL LUE Deficits / Details: WFL ROM, 5/5 strength LUE Sensation: WNL (Reports numbness in 5th digit) LUE Coordination: WNL   Lower Extremity Assessment Lower Extremity Assessment: Defer to PT evaluation   Cervical / Trunk Assessment Cervical / Trunk Assessment: Normal    Communication Communication Communication: No difficulties   Cognition Arousal/Alertness: Awake/alert Behavior During Therapy: WFL for tasks assessed/performed                                       General Comments       Exercises     Shoulder Instructions      Home Living Family/patient expects to be discharged to:: Private residence Living Arrangements: Spouse/significant other Available Help at Discharge: Family Type of Home: House Home Access: Stairs to enter Technical brewer of Steps: 2 Entrance Stairs-Rails: Right Home Layout: One level         Biochemist, clinical: Standard     Home Equipment: Cane - single point          Prior Functioning/Environment Level of Independence: Independent with assistive device(s)        Comments: Ambulates with cane. Performs sponge baths at baseline.        OT Problem List: Impaired vision/perception      OT Treatment/Interventions:      OT Goals(Current goals can be found in the care plan section) Acute Rehab OT Goals Patient Stated Goal: for vision to improve OT Goal Formulation: All assessment and education complete, DC therapy  OT Frequency:     Barriers to D/C:            Co-evaluation              AM-PAC OT "6 Clicks" Daily Activity     Outcome Measure Help from another person eating meals?: None Help from another person taking care of personal grooming?: None Help from another person toileting, which includes using toliet, bedpan, or urinal?: None Help from another person bathing (including washing, rinsing, drying)?: None Help from another person to put on and taking off regular upper body clothing?: None Help from another person to put on and taking off regular lower body clothing?: None 6 Click Score: 24   End of Session Nurse Communication:  (okay to see per RN)  Activity Tolerance: Patient tolerated treatment well Patient left: in bed;with call bell/phone within  reach  OT Visit Diagnosis: Low vision, both eyes (H54.2)                Time: 0076-2263 OT Time Calculation (min): 17 min Charges:  OT General Charges $OT Visit: 1 Visit OT Evaluation $OT Eval Moderate Complexity: 1 Mod  Nayquan Evinger, OTR/L [place]  Office [phone] Pager: [phone]   Lenward Chancellor 07/13/2020, 9:28 AM

## 2020-07-13 NOTE — Progress Notes (Signed)
PT Cancellation Note  Patient Details Name: Earl Calderon MRN: [id number] DOB: [birth year]   Cancelled Treatment:    Reason Eval/Treat Not Completed: Other (comment) Pt declines due to frequent loose/watery stools.  RN aware.   Britiny Defrain,KATHrine E 07/13/2020, 11:05 AM Arlyce Dice, DPT Acute Rehabilitation Services Pager: [phone] Office: [phone]

## 2020-07-13 NOTE — Progress Notes (Signed)
PROGRESS NOTE  Earl Calderon OHY:[id number] DOB: 1951-04-14 DOA: 07/09/2020 PCP: Pcp, No  HPI/Recap of past 24 hours:  Earl Calderon is a 69 y.o. male with medical history significant of CAD, HFrEF 25-30%, IDDM2. Presenting with increasing dizziness, weakness, leg swelling and dyspnea over the last 3 weeks. He states he first noticed his symptoms 3 weeks ago when his legs felt heavy and he could see them swell. He began noticing that he was more short of breath with activity and he even felt a little dizzy at times with vision loss from 2 weeks worse on the left. He tried herbal medicine and rest; but neither improved his symptoms.  After 3 weeks, he decided to call his PCP. He was told to go to the ED for further evaluation and management.    Of note, he has not taken medicines for DM2, HFrEF, or CAD in over a year. He states that he was trying to remove "all the chemicals" from his body. He thought that all his conditions were resolved.   Due to his reported generalized weakness, fatigue for 3 weeks and vision loss for 2 weeks, initially a CT head 07/09/2020 was done which showed age indeterminate small vessel ischemic changes throughout the periventricular white matter.  Chronic lacunar infarcts left basal ganglia and right thalamus.  No acute hemorrhage.  MRI brain was done on 07/11/2020 which showed acute or subacute infarction in the deep white matter along the lateral margin of the temporal horn and atrium of the right lateral ventricle.  No evidence of hemorrhage or mass-effect.  Moderate chronic small vessel ischemic changes elsewhere throughout the brain.    2D echo completed on 07/10/2020 showed LVEF 30 to 35%, left ventricle global hypokinesis, left atrial severely dilated, right atrial moderately dilated, mild mitral valve regurgitation, grade 2 diastolic dysfunction.  Cardiology has been consulted and will see in consultation on 07/12/2020.  07/13/20: No chest pain, ongoing  diuresing on IV Lasix 40 mg twice daily.  Reports bilateral blurry vision left greater than right.  Discussed with neurology will obtain MRA head and neck.  Assessment/Plan: Active Problems:   Acute on chronic congestive heart failure (HCC)   Acute on chronic combined systolic (congestive) and diastolic (congestive) heart failure (HCC)  Acute on chronic combined diastolic and systolic CHF 2D echo completed on 07/10/2020 showed LVEF 30 to 35%, left ventricle global hypokinesis, left atrial severely dilated, right atrial moderately dilated, mild mitral valve regurgitation, grade 2 diastolic dysfunction.  Cardiology consulted. 2D echo done on 11/12/2018 showed LVEF 25 to 30% Ongoing diuresing with IV Lasix 40 mg twice daily. 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   No evidence of hemorrhage or mass-effect.  Moderate chronic small vessel ischemic changes elsewhere throughout the brain.   LDL 92, goal less than 70 Hemoglobin A1c 9.2 with a goal of less than 7.0 Increased dose of Crestor to 40 mg daily Continue aspirin 81 mg daily No reported intracardiac thrombus or PFO on 2D echo done on 07/10/2020. PT had no further recommendations Discussed with neuro, will obtain MRA head and  neck, follow results.  Left eye blurriness/bilateral visual impairment History of CVA seen on CT head and MRI brain Will need to follow-up with ophthalmology at the time of discharge.  Type 2 diabetes with hyperglycemia Hemoglobin A1c not at goal Continue insulin sliding scale Continue  Lantus  Hyperlipidemia LDL is not at goal Crestor dose increased to 40 mg daily.  Chronic normocytic anemia Resume home medications No evidence of overt bleeding  Chronic right lower extremity wound secondary to skin picking Continue local wound care as recommended by wound care specialist.  Constipation, improving Continue bowel regimen  Medication noncompliance Patient stopped taking his current medication for over a year, stated he wanted to remove all the chemicals from his body.      Code Status: Full code  Family Communication: None at bedside.  Disposition Plan: Likely will discharge to home when cardiology signs off.  Consultants:  Cardiology  Neurology  Procedures:  None  Antimicrobials:  None  DVT prophylaxis: Subcu Lovenox daily  Status is: Inpatient.    Dispo:  Patient From: Home  Planned Disposition: Home  Expected discharge date: 07/14/20  Medically stable for discharge: No, ongoing diuresing and management of combined systolic and diastolic CHF.    Objective: Vitals:   07/12/20 1342 07/12/20 2107 07/13/20 0556 07/13/20 1205  BP: (!) 160/80 (!) 175/96 (!) 152/82 128/89  Pulse: (!) 54 60 69 60  Resp: 14 18 18 18   Temp: 98.2 F (36.8 C) 97.6 F (36.4 C) 98.8 F (37.1 C) 97.9 F (36.6 C)  TempSrc: Oral Oral Oral Oral  SpO2: 95% 98% 94% 100%  Weight:      Height:        Intake/Output Summary (Last 24 hours) at 07/13/2020 1548 Last data filed at 07/13/2020 3474 Gross per 24 hour  Intake --  Output 650 ml  Net -650 ml   Filed Weights   07/09/20 1741 07/11/20 0551  Weight: 63.5 kg 65.4 kg    Exam:  . General: 69 y.o. year-old male well-developed well-nourished no distress.  Alert oriented x3. . Cardiovascular: Regular rate and rhythm no rubs or gallops. Marland Kitchen Respiratory: Clear to auscultation no wheezes or rales. . Abdomen: Soft Nontender Normal Bowel Sounds Present.  . Musculoskeletal: Trace lower extremity edema  bilaterally.  Marland Kitchen Psychiatry: Mood is appropriate for condition and setting..  Data Reviewed: CBC: Recent Labs  Lab 07/09/20 1752 07/10/20 1116 07/10/20 1310 07/11/20 0532  WBC 5.8 5.7 5.7 4.9  NEUTROABS 4.7 4.4  --   --   HGB 13.7 12.2* 12.4* 12.3*  HCT 41.5 36.1* 38.1* 37.5*  MCV 93.9 94.0 95.0 95.7  PLT 186 164 165 259   Basic Metabolic Panel: Recent Labs  Lab 07/09/20 1752 07/09/20 1752 07/10/20 1116 07/10/20 1310 07/11/20 0532 07/12/20 0510 07/13/20 0509  NA 138  --  141  --  137 138 141  K 3.6  --  3.7  --  3.6 3.3* 3.5  CL 98  --  103  --  101 100 103  CO2 31  --  33*  --  30 29 31   GLUCOSE 416*  --  221*  --  131* 271* 253*  BUN 14  --  16  --  26* 24* 27*  CREATININE 0.96   < > 0.93 0.93 1.08 1.03 0.99  CALCIUM 8.5*  --  8.4*  --  8.0* 8.3* 8.4*  MG  --   --  1.7  --   --  1.7  --    < > =  values in this interval not displayed.   GFR: Estimated Creatinine Clearance: 67 mL/min (by C-G formula based on SCr of 0.99 mg/dL). Liver Function Tests: Recent Labs  Lab 07/09/20 1752 07/10/20 1116 07/11/20 0532  AST 16 14* 15  ALT 12 9 11   ALKPHOS 110 94 87  BILITOT 1.0 1.0 0.8  PROT 6.9 5.5* 5.9*  ALBUMIN 2.6* 2.1* 2.2*   No results for input(s): LIPASE, AMYLASE in the last 168 hours. No results for input(s): AMMONIA in the last 168 hours. Coagulation Profile: No results for input(s): INR, PROTIME in the last 168 hours. Cardiac Enzymes: No results for input(s): CKTOTAL, CKMB, CKMBINDEX, TROPONINI in the last 168 hours. BNP (last 3 results) No results for input(s): PROBNP in the last 8760 hours. HbA1C: No results for input(s): HGBA1C in the last 72 hours. CBG: Recent Labs  Lab 07/12/20 1157 07/12/20 1608 07/12/20 2107 07/13/20 0732 07/13/20 1200  GLUCAP 270* 168* 261* 188* 109*   Lipid Profile: Recent Labs    07/12/20 0510  CHOL 146  HDL 41  LDLCALC 92  TRIG 66  CHOLHDL 3.6   Thyroid Function Tests: No results for input(s): TSH, T4TOTAL,  FREET4, T3FREE, THYROIDAB in the last 72 hours. Anemia Panel: No results for input(s): VITAMINB12, FOLATE, FERRITIN, TIBC, IRON, RETICCTPCT in the last 72 hours. Urine analysis:    Component Value Date/Time   COLORURINE YELLOW 07/09/2020 2151   APPEARANCEUR CLEAR 07/09/2020 2151   LABSPEC 1.020 07/09/2020 2151   PHURINE 6.5 07/09/2020 2151   GLUCOSEU >=500 (A) 07/09/2020 2151   HGBUR MODERATE (A) 07/09/2020 2151   BILIRUBINUR NEGATIVE 07/09/2020 2151   [place] NEGATIVE 07/09/2020 2151   PROTEINUR 100 (A) 07/09/2020 2151   NITRITE NEGATIVE 07/09/2020 2151   LEUKOCYTESUR NEGATIVE 07/09/2020 2151   Sepsis Labs: @LABRCNTIP (procalcitonin:4,lacticidven:4)  ) Recent Results (from the past 240 hour(s))  Respiratory Panel by RT PCR (Flu A&B, Covid) - Nasopharyngeal Swab     Status: None   Collection Time: 07/09/20  9:51 PM   Specimen: Nasopharyngeal Swab  Result Value Ref Range Status   SARS Coronavirus 2 by RT PCR NEGATIVE NEGATIVE Final    Comment: (NOTE) SARS-CoV-2 target nucleic acids are NOT DETECTED.  The SARS-CoV-2 RNA is generally detectable in upper respiratoy specimens during the acute phase of infection. The lowest concentration of SARS-CoV-2 viral copies this assay can detect is 131 copies/mL. A negative result does not preclude SARS-Cov-2 infection and should not be used as the sole basis for treatment or other patient management decisions. A negative result may occur with  improper specimen collection/handling, submission of specimen other than nasopharyngeal swab, presence of viral mutation(s) within the areas targeted by this assay, and inadequate number of viral copies (<131 copies/mL). A negative result must be combined with clinical observations, patient history, and epidemiological information. The expected result is Negative.  Fact Sheet for Patients:  PinkCheek.be  Fact Sheet for Healthcare Providers:   GravelBags.it  This test is no t yet approved or cleared by the [place] FDA and  has been authorized for detection and/or diagnosis of SARS-CoV-2 by FDA under an Emergency Use Authorization (EUA). This EUA will remain  in effect (meaning this test can be used) for the duration of the COVID-19 declaration under Section 564(b)(1) of the Act, 21 U.S.C. section 360bbb-3(b)(1), unless the authorization is terminated or revoked sooner.     Influenza A by PCR NEGATIVE NEGATIVE Final   Influenza B by PCR NEGATIVE NEGATIVE Final    Comment: (  NOTE) The Xpert Xpress SARS-CoV-2/FLU/RSV assay is intended as an aid in  the diagnosis of influenza from Nasopharyngeal swab specimens and  should not be used as a sole basis for treatment. Nasal washings and  aspirates are unacceptable for Xpert Xpress SARS-CoV-2/FLU/RSV  testing.  Fact Sheet for Patients: PinkCheek.be  Fact Sheet for Healthcare Providers: GravelBags.it  This test is not yet approved or cleared by the [place] FDA and  has been authorized for detection and/or diagnosis of SARS-CoV-2 by  FDA under an Emergency Use Authorization (EUA). This EUA will remain  in effect (meaning this test can be used) for the duration of the  Covid-19 declaration under Section 564(b)(1) of the Act, 21  U.S.C. section 360bbb-3(b)(1), unless the authorization is  terminated or revoked. Performed at [facility], [address] [place], [place] [address]       Studies: No results found.  Scheduled Meds: . aspirin EC  81 mg Oral Daily  . carvedilol  12.5 mg Oral BID WC  . enoxaparin (LOVENOX) injection  40 mg Subcutaneous Q24H  . furosemide  40 mg Intravenous BID  . insulin aspart  0-15 Units Subcutaneous TID WC  . insulin aspart  0-5 Units Subcutaneous QHS  . insulin glargine  10 Units Subcutaneous BID  . potassium chloride  40 mEq Oral  BID  . rosuvastatin  40 mg Oral q1800  . sacubitril-valsartan  1 tablet Oral BID  . senna-docusate  2 tablet Oral BID  . spironolactone  25 mg Oral Daily  . ticagrelor  90 mg Oral BID    Continuous Infusions:    LOS: 2 days     Kayleen Memos, MD [facility] Pager [phone]  If 7PM-7AM, please contact night-coverage [email or web address] Password [facility] 07/13/2020, 3:48 PM

## 2020-07-14 DIAGNOSIS — I5043 Acute on chronic combined systolic (congestive) and diastolic (congestive) heart failure: Secondary | ICD-10-CM | POA: Diagnosis not present

## 2020-07-14 LAB — BASIC METABOLIC PANEL
Anion gap: 6 (ref 5–15)
BUN: 27 mg/dL — ABNORMAL HIGH (ref 8–23)
CO2: 32 mmol/L (ref 22–32)
Calcium: 8.3 mg/dL — ABNORMAL LOW (ref 8.9–10.3)
Chloride: 101 mmol/L (ref 98–111)
Creatinine, Ser: 1.01 mg/dL (ref 0.61–1.24)
GFR, Estimated: >60 mL/min (ref >60)
Glucose, Bld: 127 mg/dL — ABNORMAL HIGH (ref 70–99)
Potassium: 3.8 mmol/L (ref 3.5–5.1)
Sodium: 139 mmol/L (ref 135–145)

## 2020-07-14 LAB — GLUCOSE, CAPILLARY
Glucose-Capillary: 113 mg/dL — ABNORMAL HIGH (ref 70–99)
Glucose-Capillary: 168 mg/dL — ABNORMAL HIGH (ref 70–99)
Glucose-Capillary: 150 mg/dL — ABNORMAL HIGH (ref 70–99)
Glucose-Capillary: 214 mg/dL — ABNORMAL HIGH (ref 70–99)

## 2020-07-14 NOTE — Progress Notes (Signed)
Progress Note   Subjective   Doing well today, the patient denies CP or SOB.  No new concerns  Inpatient Medications    Scheduled Meds: . aspirin EC  81 mg Oral Daily  . carvedilol  12.5 mg Oral BID WC  . enoxaparin (LOVENOX) injection  40 mg Subcutaneous Q24H  . furosemide  40 mg Intravenous BID  . insulin aspart  0-15 Units Subcutaneous TID WC  . insulin aspart  0-5 Units Subcutaneous QHS  . insulin glargine  10 Units Subcutaneous BID  . potassium chloride  40 mEq Oral BID  . rosuvastatin  40 mg Oral q1800  . sacubitril-valsartan  1 tablet Oral BID  . senna-docusate  2 tablet Oral BID  . spironolactone  25 mg Oral Daily   Continuous Infusions:  PRN Meds: acetaminophen **OR** acetaminophen, hydrALAZINE, ondansetron **OR** ondansetron (ZOFRAN) IV   Vital Signs    Vitals:   07/13/20 2121 07/14/20 0454 07/14/20 0500 07/14/20 0827  BP: (!) 181/93 (!) 150/83  (!) 163/104  Pulse: 60 62  67  Resp: 18 18    Temp: 97.6 F (36.4 C) 97.8 F (36.6 C)    TempSrc: Oral Oral    SpO2: 93% 100%    Weight:   68.4 kg   Height:        Intake/Output Summary (Last 24 hours) at 07/14/2020 0921 Last data filed at 07/13/2020 1700 Gross per 24 hour  Intake 480 ml  Output --  Net 480 ml   Filed Weights   07/09/20 1741 07/11/20 0551 07/14/20 0500  Weight: 63.5 kg 65.4 kg 68.4 kg    Telemetry    Sinus with rare and short NSVT - Personally Reviewed  Physical Exam   GEN- The patient is well appearing, alert and oriented x 3 today.   Head- normocephalic, atraumatic Eyes-  Sclera clear, conjunctiva pink Ears- hearing intact Oropharynx- clear Neck- supple, Lungs-  normal work of breathing Heart- Regular rate and rhythm  GI- soft  Extremities- no clubbing, cyanosis, or edema  MS- no significant deformity or atrophy Skin- no rash or lesion Psych- euthymic mood, full affect Neuro- strength and sensation are intact   Labs    Chemistry Recent Labs  Lab 07/09/20 1752  07/09/20 1752 07/10/20 1116 07/10/20 1310 07/11/20 0532 07/11/20 0532 07/12/20 0510 07/13/20 0509 07/14/20 0651  NA 138   < > 141   < > 137   < > 138 141 139  K 3.6   < > 3.7   < > 3.6   < > 3.3* 3.5 3.8  CL 98   < > 103   < > 101   < > 100 103 101  CO2 31   < > 33*   < > 30   < > 29 31 32  GLUCOSE 416*   < > 221*   < > 131*   < > 271* 253* 127*  BUN 14   < > 16   < > 26*   < > 24* 27* 27*  CREATININE 0.96   < > 0.93   < > 1.08   < > 1.03 0.99 1.01  CALCIUM 8.5*   < > 8.4*   < > 8.0*   < > 8.3* 8.4* 8.3*  PROT 6.9  --  5.5*  --  5.9*  --   --   --   --   ALBUMIN 2.6*  --  2.1*  --  2.2*  --   --   --   --  AST 16  --  14*  --  15  --   --   --   --   ALT 12  --  9  --  11  --   --   --   --   ALKPHOS 110  --  94  --  87  --   --   --   --   BILITOT 1.0  --  1.0  --  0.8  --   --   --   --   GFRNONAA >60   < > >60   < > >60   < > >60 >60 >60  ANIONGAP 9   < > 5   < > 6   < > 9 7 6    < > = values in this interval not displayed.     Hematology Recent Labs  Lab 07/10/20 1116 07/10/20 1310 07/11/20 0532  WBC 5.7 5.7 4.9  RBC 3.84* 4.01* 3.92*  HGB 12.2* 12.4* 12.3*  HCT 36.1* 38.1* 37.5*  MCV 94.0 95.0 95.7  MCH 31.8 30.9 31.4  MCHC 33.8 32.5 32.8  RDW 13.0 13.1 13.0  PLT 164 165 180     Patient ID  Earl Calderon is a 69 y.o. male with a hx of DM2 and hypertension who is being seen today for the evaluation of CHF at the request of Dr. Nevada Crane.  Assessment & Plan    1.  Acute on chronic combined systolic and diastolic CHF/ ischemic CM EF 30-35% Clinically improving with IV diuresis Consider switching to PO lasix tomorrow Continue current doses of CHF medicines May be able to uptitrate  Coreg/ entresto in next 24 hours  2. CAD No ischemic symptoms  3. HL crestor restarted  He will need close outpatient cardiology follow-up He will likely remain in the hospital over the weekend.  Aronov Grayer MD, [facility] 07/14/2020 9:21 AM

## 2020-07-14 NOTE — Evaluation (Signed)
Physical Therapy Evaluation Patient Details Name: Earl Calderon MRN: [id number] DOB: November 16, 1950 Today's Date: 07/14/2020   History of Present Illness  Earl Calderon is a 69 y.o. male with medical history significant of CAD, HFrEF 25-30%, IDDM2. Presenting with increasing dizziness, weakness, leg swelling and dyspnea over the last 3 weeks. He states he first noticed his symptoms 3 weeks ago when his legs felt heavy and he could see them swell. He began noticing that he was more short of breath with activity and he even felt a little dizzy at times with vision loss from 2 weeks worse on the left. He tried herbal medicine and rest; but neither improved his symptoms.  After 3 weeks, he decided to call his PCP. He was told to go to the ED for further evaluation and management. Admitted for acute on chronic CHF.  Additionally MRI brain revealed Acute or subacute infarction in the deep white matter along thelateral margin of the temporal horn and atrium of the right lateralventricle.  Clinical Impression  Pt admitted with above diagnosis. Pt presenting with mild deficits in dynamic balance and gait endurance.  He reports dizziness when focusing to read and when he first gets up in the morning but no complaints with head movement or rolling.  Will benefit from PT to advance balance and gait, but likely no needs at discharge.  Did discuss could follow up with PCP for outpt PT if he did not feeling like he was back at baseline mobility/balance - pt agreeable.  Pt currently with functional limitations due to the deficits listed below (see PT Problem List). Pt will benefit from skilled PT to increase their independence and safety with mobility to allow discharge to the venue listed below.       Follow Up Recommendations No PT follow up;Supervision - Intermittent    Equipment Recommendations  None recommended by PT    Recommendations for Other Services       Precautions / Restrictions  Precautions Precautions: None      Mobility  Bed Mobility Overal bed mobility: Independent                  Transfers Overall transfer level: Modified independent Equipment used: Straight cane                Ambulation/Gait Ambulation/Gait assistance: Modified independent (Device/Increase time);Supervision Gait Distance (Feet): 120 Feet Assistive device: Straight cane Gait Pattern/deviations: Antalgic;Decreased stance time - left;Decreased stride length Gait velocity: decreased   General Gait Details: Pt has been ambulating to restroom without AD but had supervision for hallway ambulation.  Pt demonstrating mild gait deviations but no overt LOB.  Stairs            Wheelchair Mobility    Modified Rankin (Stroke Patients Only) Modified Rankin (Stroke Patients Only) Pre-Morbid Rankin Score: No symptoms Modified Rankin: No significant disability     Balance Overall balance assessment: Needs assistance Sitting-balance support: No upper extremity supported Sitting balance-Leahy Scale: Normal     Standing balance support: No upper extremity supported;Single extremity supported Standing balance-Leahy Scale: Good                 High Level Balance Comments: Pt with mild unsteadiness but no overt LOB with dynamic activities including head turns and stepping over objects.             Pertinent Vitals/Pain Pain Assessment: No/denies pain    Home Living Family/patient expects to be discharged to:: Private residence Living Arrangements: Spouse/significant  other Available Help at Discharge: Family Type of Home: House Home Access: Stairs to enter Entrance Stairs-Rails: Right Entrance Stairs-Number of Steps: 2 Home Layout: One level Home Equipment: Cane - single point      Prior Function Level of Independence: Independent with assistive device(s)         Comments: Ambulates with cane. Performs sponge baths at baseline. Reports able to  get out and work in his yard.     Hand Dominance   Dominant Hand: Right    Extremity/Trunk Assessment   Upper Extremity Assessment Upper Extremity Assessment: Defer to OT evaluation    Lower Extremity Assessment Lower Extremity Assessment: LLE deficits/detail;RLE deficits/detail RLE Deficits / Details: ROM WFL; MMT 5/5 LLE Deficits / Details: ROM WFL; MMT 5/5    Cervical / Trunk Assessment Cervical / Trunk Assessment: Normal  Communication   Communication: No difficulties  Cognition Arousal/Alertness: Awake/alert Behavior During Therapy: WFL for tasks assessed/performed Overall Cognitive Status: Within Functional Limits for tasks assessed                                        General Comments General comments (skin integrity, edema, etc.): Pt had c/o L eye blurriness (assessed by OT yesterday).  Pt had some complaints of dizziness -states they occur when he tries to focus with reading due to bluriness and when he first sits up in the morning.  No dizziness during therapy.  Performed EOEM and gaze stabilization without difficulty    Exercises     Assessment/Plan    PT Assessment Patient needs continued PT services  PT Problem List Decreased strength;Decreased mobility;Decreased balance;Decreased activity tolerance;Cardiopulmonary status limiting activity;Decreased knowledge of use of DME       PT Treatment Interventions DME instruction;Therapeutic activities;Gait training;Therapeutic exercise;Patient/family education;Stair training;Balance training;Functional mobility training    PT Goals (Current goals can be found in the Care Plan section)  Acute Rehab PT Goals Patient Stated Goal: for vision to improve PT Goal Formulation: With patient Time For Goal Achievement: 07/28/20 Potential to Achieve Goals: Good    Frequency Min 3X/week   Barriers to discharge        Co-evaluation               AM-PAC PT "6 Clicks" Mobility  Outcome Measure  Help needed turning from your back to your side while in a flat bed without using bedrails?: None Help needed moving from lying on your back to sitting on the side of a flat bed without using bedrails?: None Help needed moving to and from a bed to a chair (including a wheelchair)?: None Help needed standing up from a chair using your arms (e.g., wheelchair or bedside chair)?: None Help needed to walk in hospital room?: None Help needed climbing 3-5 steps with a railing? : A Little 6 Click Score: 23    End of Session   Activity Tolerance: Patient tolerated treatment well Patient left: in bed;with call bell/phone within reach Nurse Communication: Mobility status PT Visit Diagnosis: Unsteadiness on feet (R26.81)    Time: 4268-3419 PT Time Calculation (min) (ACUTE ONLY): 17 min   Charges:   PT Evaluation $PT Eval Low Complexity: 1 Low          Valeen Borys, PT Acute Rehab Services Pager [phone] [facility] [phone]    Karlton Lemon 07/14/2020, 3:00 PM

## 2020-07-14 NOTE — Progress Notes (Signed)
PROGRESS NOTE  Earl Calderon DUK:[id number] DOB: [birth year] DOA: 07/09/2020 PCP: Pcp, No  HPI/Recap of past 24 hours:  Earl Calderon is a 69 y.o. male with medical history significant of CAD, HFrEF 25-30%, IDDM2. Presenting with increasing dizziness, weakness, leg swelling and dyspnea over the last 3 weeks. He states he first noticed his symptoms 3 weeks ago when his legs felt heavy and he could see them swell. He began noticing that he was more short of breath with activity and he even felt a little dizzy at times with vision loss from 2 weeks worse on the left. He tried herbal medicine and rest; but neither improved his symptoms.  After 3 weeks, he decided to call his PCP. He was told to go to the ED for further evaluation and management.    Of note, he has not taken medicines for DM2, HFrEF, or CAD in over a year. He states that he was trying to remove "all the chemicals" from his body. He thought that all his conditions were resolved.   Due to his reported generalized weakness, fatigue for 3 weeks and vision loss for 2 weeks, initially a CT head 07/09/2020 was done which showed age indeterminate small vessel ischemic changes throughout the periventricular white matter.  Chronic lacunar infarcts left basal ganglia and right thalamus.  No acute hemorrhage.  MRI brain was done on 07/11/2020 which showed acute or subacute infarction in the deep white matter along the lateral margin of the temporal horn and atrium of the right lateral ventricle.  No evidence of hemorrhage or mass-effect.  Moderate chronic small vessel ischemic changes elsewhere throughout the brain.    2D echo completed on 07/10/2020 showed LVEF 30 to 35%, left ventricle global hypokinesis, left atrial severely dilated, right atrial moderately dilated, mild mitral valve regurgitation, grade 2 diastolic dysfunction.  Cardiology has been consulted and will see in consultation on 07/12/2020.  07/14/20: Denies any chest pain.   Denies dyspnea at rest.  Ongoing diuresing with IV Lasix 40 mg BID.  Cardiology following.  Assessment/Plan with IV: Active Problems:   Acute on chronic congestive heart failure (HCC)   Acute on chronic combined systolic (congestive) and diastolic (congestive) heart failure (HCC)  Acute on chronic combined diastolic and systolic CHF 2D echo completed on 07/10/2020 showed LVEF 30 to 35%, left ventricle global hypokinesis, left atrial severely dilated, right atrial moderately dilated, mild mitral valve regurgitation, grade 2 diastolic dysfunction.  Cardiology consulted. 2D echo done on 11/12/2018 showed LVEF 25 to 30% Ongoing diuresing with IV Lasix 40 mg twice daily. Net I&O -3.4 L>> -2.4L, unclear if UO recorded is accurate Continue strict I's and O's and daily weight Still volume + Unna boots in place  Possible cardiac amyloidosis seen on 2D echo Defer management to cardiology  Coronary artery disease status post PCI with stent placement Denies any anginal symptoms at the time of this exam Continue cardiac medications  Acute or subacute infarction in the deep white matter along the lateral margin of the temporal horn and atrium of the right lateral ventricle seen on MRI brain 07/11/2020.   No evidence of hemorrhage or mass-effect.  Moderate chronic small vessel ischemic changes elsewhere throughout the brain.   LDL 92, goal less than 70 Hemoglobin A1c 9.2 with a goal of less than 7.0 Increased dose of Crestor to 40 mg daily Continue aspirin 81 mg daily No reported intracardiac thrombus or PFO on 2D echo done on 07/10/2020. PT had no further recommendations Discussed with  neuro, will obtain MRA head and neck. MRA head wo contrast/MRA w/wo contrast completed on 07/13/20 revealed: No evidence of hemodynamically significant stenosis of the carotid or vertebral arteries in the neck.  Luminal irregularity of the bilateral ACA and MCA vascular trees, suggestive of atherosclerotic disease, with  moderate stenosis at the distal bilateral M1/MCA and bilateral A3/ACA.  Mild stenosis in the mid basilar artery. Will need to follow up with neurology outpatient.  Left eye blurriness/bilateral visual impairment History of CVA seen on CT head and MRI brain Will need to follow-up with ophthalmology at time of discharge.  Type 2 diabetes with hyperglycemia Hemoglobin A1c not at goal Continue insulin sliding scale Continue Lantus  Hyperlipidemia LDL is not at goal Crestor dose increased to 40 mg daily.  Chronic normocytic anemia Resume home medications No evidence of overt bleeding  Chronic right lower extremity wound secondary to skin picking Continue local wound care as recommended by wound care specialist.  Constipation, improving Continue bowel regimen  Medication noncompliance Patient stopped taking his current medication for over a year, stated he wanted to remove all the chemicals from his body.      Code Status: Full code  Family Communication: None at bedside.  Disposition Plan: Likely will discharge to home when cardiology signs off.  Consultants:  Cardiology  Neurology  Procedures:  None  Antimicrobials:  None  DVT prophylaxis: Subcu Lovenox daily  Status is: Inpatient.    Dispo:  Patient From: Home  Planned Disposition: Home  Expected discharge date: 07/15/20  Medically stable for discharge: No, ongoing diuresing and management of combined systolic and diastolic CHF by cardiology.    Objective: Vitals:   07/14/20 0454 07/14/20 0500 07/14/20 0827 07/14/20 1426  BP: (!) 150/83  (!) 163/104 140/83  Pulse: 62  67 (!) 58  Resp: 18   18  Temp: 97.8 F (36.6 C)   97.8 F (36.6 C)  TempSrc: Oral   Oral  SpO2: 100%   100%  Weight:  68.4 kg    Height:        Intake/Output Summary (Last 24 hours) at 07/14/2020 1612 Last data filed at 07/14/2020 1330 Gross per 24 hour  Intake 720 ml  Output 250 ml  Net 470 ml   Filed Weights    07/09/20 1741 07/11/20 0551 07/14/20 0500  Weight: 63.5 kg 65.4 kg 68.4 kg    Exam:  . General: 69 y.o. year-old male well-developed well-nourished in no acute distress.  Alert and oriented x3.   . Cardiovascular: Regular rate and rhythm no rubs or gallops.. . Respiratory: Clear to auscultation no wheezes or rales. . Abdomen: Some of the normal bowel sounds present. . Musculoskeletal: Lower extremity edema bilaterally.  He has Unna boots in place..  . Psychiatry: Mood is appropriate for condition and setting.  Data Reviewed: CBC: Recent Labs  Lab 07/09/20 1752 07/10/20 1116 07/10/20 1310 07/11/20 0532  WBC 5.8 5.7 5.7 4.9  NEUTROABS 4.7 4.4  --   --   HGB 13.7 12.2* 12.4* 12.3*  HCT 41.5 36.1* 38.1* 37.5*  MCV 93.9 94.0 95.0 95.7  PLT 186 164 165 371   Basic Metabolic Panel: Recent Labs  Lab 07/10/20 1116 07/10/20 1116 07/10/20 1310 07/11/20 0532 07/12/20 0510 07/13/20 0509 07/14/20 0651  NA 141  --   --  137 138 141 139  K 3.7  --   --  3.6 3.3* 3.5 3.8  CL 103  --   --  101 100 103 101  CO2 33*  --   --  30 29 31  32  GLUCOSE 221*  --   --  131* 271* 253* 127*  BUN 16  --   --  26* 24* 27* 27*  CREATININE 0.93   < > 0.93 1.08 1.03 0.99 1.01  CALCIUM 8.4*  --   --  8.0* 8.3* 8.4* 8.3*  MG 1.7  --   --   --  1.7  --   --    < > = values in this interval not displayed.   GFR: Estimated Creatinine Clearance: 68.7 mL/min (by C-G formula based on SCr of 1.01 mg/dL). Liver Function Tests: Recent Labs  Lab 07/09/20 1752 07/10/20 1116 07/11/20 0532  AST 16 14* 15  ALT 12 9 11   ALKPHOS 110 94 87  BILITOT 1.0 1.0 0.8  PROT 6.9 5.5* 5.9*  ALBUMIN 2.6* 2.1* 2.2*   No results for input(s): LIPASE, AMYLASE in the last 168 hours. No results for input(s): AMMONIA in the last 168 hours. Coagulation Profile: No results for input(s): INR, PROTIME in the last 168 hours. Cardiac Enzymes: No results for input(s): CKTOTAL, CKMB, CKMBINDEX, TROPONINI in the last 168  hours. BNP (last 3 results) No results for input(s): PROBNP in the last 8760 hours. HbA1C: No results for input(s): HGBA1C in the last 72 hours. CBG: Recent Labs  Lab 07/13/20 1200 07/13/20 1655 07/13/20 2115 07/14/20 0735 07/14/20 1151  GLUCAP 109* 200* 210* 113* 168*   Lipid Profile: Recent Labs    07/12/20 0510  CHOL 146  HDL 41  LDLCALC 92  TRIG 66  CHOLHDL 3.6   Thyroid Function Tests: No results for input(s): TSH, T4TOTAL, FREET4, T3FREE, THYROIDAB in the last 72 hours. Anemia Panel: No results for input(s): VITAMINB12, FOLATE, FERRITIN, TIBC, IRON, RETICCTPCT in the last 72 hours. Urine analysis:    Component Value Date/Time   COLORURINE YELLOW 07/09/2020 2151   APPEARANCEUR CLEAR 07/09/2020 2151   LABSPEC 1.020 07/09/2020 2151   PHURINE 6.5 07/09/2020 2151   GLUCOSEU >=500 (A) 07/09/2020 2151   HGBUR MODERATE (A) 07/09/2020 2151   BILIRUBINUR NEGATIVE 07/09/2020 2151   [place] NEGATIVE 07/09/2020 2151   PROTEINUR 100 (A) 07/09/2020 2151   NITRITE NEGATIVE 07/09/2020 2151   LEUKOCYTESUR NEGATIVE 07/09/2020 2151   Sepsis Labs: @LABRCNTIP (procalcitonin:4,lacticidven:4)  ) Recent Results (from the past 240 hour(s))  Respiratory Panel by RT PCR (Flu A&B, Covid) - Nasopharyngeal Swab     Status: None   Collection Time: 07/09/20  9:51 PM   Specimen: Nasopharyngeal Swab  Result Value Ref Range Status   SARS Coronavirus 2 by RT PCR NEGATIVE NEGATIVE Final    Comment: (NOTE) SARS-CoV-2 target nucleic acids are NOT DETECTED.  The SARS-CoV-2 RNA is generally detectable in upper respiratoy specimens during the acute phase of infection. The lowest concentration of SARS-CoV-2 viral copies this assay can detect is 131 copies/mL. A negative result does not preclude SARS-Cov-2 infection and should not be used as the sole basis for treatment or other patient management decisions. A negative result may occur with  improper specimen collection/handling, submission  of specimen other than nasopharyngeal swab, presence of viral mutation(s) within the areas targeted by this assay, and inadequate number of viral copies (<131 copies/mL). A negative result must be combined with clinical observations, patient history, and epidemiological information. The expected result is Negative.  Fact Sheet for Patients:  PinkCheek.be  Fact Sheet for Healthcare Providers:  GravelBags.it  This test is no t yet approved or  cleared by the [place] and  has been authorized for detection and/or diagnosis of SARS-CoV-2 by FDA under an Emergency Use Authorization (EUA). This EUA will remain  in effect (meaning this test can be used) for the duration of the COVID-19 declaration under Section 564(b)(1) of the Act, 21 U.S.C. section 360bbb-3(b)(1), unless the authorization is terminated or revoked sooner.     Influenza A by PCR NEGATIVE NEGATIVE Final   Influenza B by PCR NEGATIVE NEGATIVE Final    Comment: (NOTE) The Xpert Xpress SARS-CoV-2/FLU/RSV assay is intended as an aid in  the diagnosis of influenza from Nasopharyngeal swab specimens and  should not be used as a sole basis for treatment. Nasal washings and  aspirates are unacceptable for Xpert Xpress SARS-CoV-2/FLU/RSV  testing.  Fact Sheet for Patients: PinkCheek.be  Fact Sheet for Healthcare Providers: GravelBags.it  This test is not yet approved or cleared by the [place] FDA and  has been authorized for detection and/or diagnosis of SARS-CoV-2 by  FDA under an Emergency Use Authorization (EUA). This EUA will remain  in effect (meaning this test can be used) for the duration of the  Covid-19 declaration under Section 564(b)(1) of the Act, 21  U.S.C. section 360bbb-3(b)(1), unless the authorization is  terminated or revoked. Performed at [facility], [address] [place], [place] [address]       Studies: MR ANGIO HEAD WO CONTRAST  Result Date: 07/13/2020 CLINICAL DATA:  Stroke, follow-up. EXAM: MRA NECK WITHOUT AND WITH CONTRAST MRA HEAD WITHOUT CONTRAST TECHNIQUE: Multiplanar and multiecho pulse sequences of the neck were obtained without and with intravenous contrast. Angiographic images of the neck were obtained using MRA technique without and with intravenous contast.; Angiographic images of the Circle of Willis were obtained using MRA technique without intravenous contrast. CONTRAST:  67mL GADAVIST GADOBUTROL 1 MMOL/ML IV SOLN COMPARISON:  None. FINDINGS: MRA NECK FINDINGS Aortic arch: Evaluation limited by motion and contrast bolus timing. Right carotid system: Normal course and caliber without stenosis or evidence of dissection. Left carotid system: Normal caliber without stenosis or evidence of dissection. Increased tortuosity of the upper cervical segment of the right ICA. Vertebral arteries: Left dominant. Vertebral arteries are normal in course and caliber to the vertebrobasilar confluence without stenosis or evidence of dissection. Bilateral pleural effusion. MRA HEAD FINDINGS The visualized portions of the distal cervical and intracranial internal carotid arteries are widely patent with normal flow related enhancement. Luminal irregularity are seen along the bilateral ACA and MCA vascular trees with moderate stenosis at the distal bilateral M1/MCA and bilateral A3/ACA. No intracranial aneurysm within the anterior circulation. The vertebral arteries are widely patent with antegrade flow noting dominant left vertebral artery. Mild luminal irregularity of the basilar artery with mild stenosis in the mid segment. Mild luminal irregularity along the bilateral posterior cerebral arteries without hemodynamically significant stenosis. No intracranial aneurysm within the posterior circulation. IMPRESSION: 1. Suboptimal study due to motion and contrast bolus  timing. 2. No evidence of hemodynamically significant stenosis of the carotid or vertebral arteries in the neck. 3. Luminal irregularity of the bilateral ACA and MCA vascular trees, suggestive of atherosclerotic disease, with moderate stenosis at the distal bilateral M1/MCA and bilateral A3/ACA. 4. Mild stenosis in the mid basilar artery. Electronically Signed   By: Pedro Earls M.D.   On: 07/13/2020 18:55   MR ANGIO NECK W WO CONTRAST  Result Date: 07/13/2020 CLINICAL DATA:  Stroke, follow-up. EXAM: MRA NECK WITHOUT AND  WITH CONTRAST MRA HEAD WITHOUT CONTRAST TECHNIQUE: Multiplanar and multiecho pulse sequences of the neck were obtained without and with intravenous contrast. Angiographic images of the neck were obtained using MRA technique without and with intravenous contast.; Angiographic images of the Circle of Willis were obtained using MRA technique without intravenous contrast. CONTRAST:  81mL GADAVIST GADOBUTROL 1 MMOL/ML IV SOLN COMPARISON:  None. FINDINGS: MRA NECK FINDINGS Aortic arch: Evaluation limited by motion and contrast bolus timing. Right carotid system: Normal course and caliber without stenosis or evidence of dissection. Left carotid system: Normal caliber without stenosis or evidence of dissection. Increased tortuosity of the upper cervical segment of the right ICA. Vertebral arteries: Left dominant. Vertebral arteries are normal in course and caliber to the vertebrobasilar confluence without stenosis or evidence of dissection. Bilateral pleural effusion. MRA HEAD FINDINGS The visualized portions of the distal cervical and intracranial internal carotid arteries are widely patent with normal flow related enhancement. Luminal irregularity are seen along the bilateral ACA and MCA vascular trees with moderate stenosis at the distal bilateral M1/MCA and bilateral A3/ACA. No intracranial aneurysm within the anterior circulation. The vertebral arteries are widely patent with  antegrade flow noting dominant left vertebral artery. Mild luminal irregularity of the basilar artery with mild stenosis in the mid segment. Mild luminal irregularity along the bilateral posterior cerebral arteries without hemodynamically significant stenosis. No intracranial aneurysm within the posterior circulation. IMPRESSION: 1. Suboptimal study due to motion and contrast bolus timing. 2. No evidence of hemodynamically significant stenosis of the carotid or vertebral arteries in the neck. 3. Luminal irregularity of the bilateral ACA and MCA vascular trees, suggestive of atherosclerotic disease, with moderate stenosis at the distal bilateral M1/MCA and bilateral A3/ACA. 4. Mild stenosis in the mid basilar artery. Electronically Signed   By: Pedro Earls M.D.   On: 07/13/2020 18:55    Scheduled Meds: . aspirin EC  81 mg Oral Daily  . carvedilol  12.5 mg Oral BID WC  . enoxaparin (LOVENOX) injection  40 mg Subcutaneous Q24H  . furosemide  40 mg Intravenous BID  . insulin aspart  0-15 Units Subcutaneous TID WC  . insulin aspart  0-5 Units Subcutaneous QHS  . insulin glargine  10 Units Subcutaneous BID  . potassium chloride  40 mEq Oral BID  . rosuvastatin  40 mg Oral q1800  . sacubitril-valsartan  1 tablet Oral BID  . senna-docusate  2 tablet Oral BID  . spironolactone  25 mg Oral Daily    Continuous Infusions:    LOS: 3 days     Kayleen Memos, MD [facility] Pager [phone]  If 7PM-7AM, please contact night-coverage [email or web address] Password [facility] 07/14/2020, 4:12 PM

## 2020-07-15 DIAGNOSIS — I5043 Acute on chronic combined systolic (congestive) and diastolic (congestive) heart failure: Secondary | ICD-10-CM | POA: Diagnosis not present

## 2020-07-15 LAB — GLUCOSE, CAPILLARY
Glucose-Capillary: 224 mg/dL — ABNORMAL HIGH (ref 70–99)
Glucose-Capillary: 106 mg/dL — ABNORMAL HIGH (ref 70–99)
Glucose-Capillary: 152 mg/dL — ABNORMAL HIGH (ref 70–99)
Glucose-Capillary: 292 mg/dL — ABNORMAL HIGH (ref 70–99)

## 2020-07-15 LAB — BASIC METABOLIC PANEL
Anion gap: 6 (ref 5–15)
BUN: 27 mg/dL — ABNORMAL HIGH (ref 8–23)
CO2: 30 mmol/L (ref 22–32)
Calcium: 8.4 mg/dL — ABNORMAL LOW (ref 8.9–10.3)
Chloride: 102 mmol/L (ref 98–111)
Creatinine, Ser: 1.04 mg/dL (ref 0.61–1.24)
GFR, Estimated: >60 mL/min (ref >60)
Glucose, Bld: 267 mg/dL — ABNORMAL HIGH (ref 70–99)
Potassium: 4.4 mmol/L (ref 3.5–5.1)
Sodium: 138 mmol/L (ref 135–145)

## 2020-07-15 MED ORDER — ASPIRIN EC 81 MG PO TBEC
162.0000 mg | DELAYED_RELEASE_TABLET | Freq: Every day | ORAL | Status: DC
  Administered 2020-07-15 – 2020-07-19 (×5): 162 mg via ORAL
  Filled 2020-07-15 (×6): qty 2

## 2020-07-15 MED ORDER — SACUBITRIL-VALSARTAN 49-51 MG PO TABS
1.0000 | ORAL_TABLET | Freq: Two times a day (BID) | ORAL | Status: DC
  Administered 2020-07-15 – 2020-07-19 (×9): 1 via ORAL
  Filled 2020-07-15 (×9): qty 1

## 2020-07-15 MED ORDER — CARVEDILOL 25 MG PO TABS
25.0000 mg | ORAL_TABLET | Freq: Two times a day (BID) | ORAL | Status: DC
  Administered 2020-07-15 – 2020-07-19 (×7): 25 mg via ORAL
  Filled 2020-07-15 (×7): qty 1

## 2020-07-15 NOTE — Progress Notes (Signed)
Progress Note   Subjective   Doing well today, the patient denies CP or SOB.  No new concerns  Inpatient Medications    Scheduled Meds: . aspirin EC  81 mg Oral Daily  . carvedilol  12.5 mg Oral BID WC  . enoxaparin (LOVENOX) injection  40 mg Subcutaneous Q24H  . furosemide  40 mg Intravenous BID  . insulin aspart  0-15 Units Subcutaneous TID WC  . insulin aspart  0-5 Units Subcutaneous QHS  . insulin glargine  10 Units Subcutaneous BID  . rosuvastatin  40 mg Oral q1800  . sacubitril-valsartan  1 tablet Oral BID  . senna-docusate  2 tablet Oral BID  . spironolactone  25 mg Oral Daily   Continuous Infusions:  PRN Meds: acetaminophen **OR** acetaminophen, hydrALAZINE, ondansetron **OR** ondansetron (ZOFRAN) IV   Vital Signs    Vitals:   07/14/20 1717 07/14/20 1840 07/15/20 0541 07/15/20 0806  BP: (!) 175/104 (!) 140/94 (!) 149/73 (!) 168/101  Pulse: 61  71 69  Resp:   18   Temp:   98.2 F (36.8 C)   TempSrc:      SpO2:   95%   Weight:      Height:        Intake/Output Summary (Last 24 hours) at 07/15/2020 0848 Last data filed at 07/15/2020 0500 Gross per 24 hour  Intake 840 ml  Output 1175 ml  Net -335 ml   Filed Weights   07/09/20 1741 07/11/20 0551 07/14/20 0500  Weight: 63.5 kg 65.4 kg 68.4 kg    Telemetry    sinus - Personally Reviewed  Physical Exam   GEN- The patient is well appearing, alert and oriented x 3 today.   Head- normocephalic, atraumatic Eyes-  Sclera clear, conjunctiva pink Ears- hearing intact Oropharynx- clear Neck- supple, Lungs-  normal work of breathing Heart- Regular rate and rhythm  GI- soft  Extremities-+edema  MS- no significant deformity or atrophy Skin- no rash or lesion Psych- euthymic mood, full affect Neuro- strength and sensation are intact   Labs    Chemistry Recent Labs  Lab 07/09/20 1752 07/09/20 1752 07/10/20 1116 07/10/20 1310 07/11/20 0532 07/12/20 0510 07/13/20 0509 07/14/20 0651  07/15/20 0600  NA 138   < > 141   < > 137   < > 141 139 138  K 3.6   < > 3.7   < > 3.6   < > 3.5 3.8 4.4  CL 98   < > 103   < > 101   < > 103 101 102  CO2 31   < > 33*   < > 30   < > 31 32 30  GLUCOSE 416*   < > 221*   < > 131*   < > 253* 127* 267*  BUN 14   < > 16   < > 26*   < > 27* 27* 27*  CREATININE 0.96   < > 0.93   < > 1.08   < > 0.99 1.01 1.04  CALCIUM 8.5*   < > 8.4*   < > 8.0*   < > 8.4* 8.3* 8.4*  PROT 6.9  --  5.5*  --  5.9*  --   --   --   --   ALBUMIN 2.6*  --  2.1*  --  2.2*  --   --   --   --   AST 16  --  14*  --  15  --   --   --   --  ALT 12  --  9  --  11  --   --   --   --   ALKPHOS 110  --  94  --  87  --   --   --   --   BILITOT 1.0  --  1.0  --  0.8  --   --   --   --   GFRNONAA >60   < > >60   < > >60   < > >60 >60 >60  ANIONGAP 9   < > 5   < > 6   < > 7 6 6    < > = values in this interval not displayed.     Hematology Recent Labs  Lab 07/10/20 1116 07/10/20 1310 07/11/20 0532  WBC 5.7 5.7 4.9  RBC 3.84* 4.01* 3.92*  HGB 12.2* 12.4* 12.3*  HCT 36.1* 38.1* 37.5*  MCV 94.0 95.0 95.7  MCH 31.8 30.9 31.4  MCHC 33.8 32.5 32.8  RDW 13.0 13.1 13.0  PLT 164 165 180     Patient ID  Earl Thompsonis a 68 y.o.malewith a hx of DM2 and hypertensionwho is being seen today for the evaluation of CHFat the request ofDr. Nevada Crane.  Assessment & Plan    1.  Acute on chronic combined systolic and diastolic CHF/ ischemic CM EF 30% Clinically improving with IV duiresis Continue IV diuresis another 24 hours and reassess tomorrow Increase coreg to 25mg  daily today Increase secubitrol/vasartan to 49/51mg  bid today.  Consider further titration in 48 hours Echo raises concern for cardiac amyloidosis.  Further evaluation will be considered this week.  2. CAD No ischemic symptoms  3. HL He is on crestor  He will need close cardiology follow-up after discharge  Halfhill Grayer MD, [facility] 07/15/2020 8:48 AM

## 2020-07-15 NOTE — Progress Notes (Signed)
PROGRESS NOTE  Earl Calderon YQM:[id number] DOB: [birth year] DOA: 07/09/2020 PCP: Pcp, No  HPI/Recap of past 24 hours:  Earl Calderon is a 69 y.o. male with medical history significant of CAD, HFrEF 25-30%, IDDM2. Presenting with increasing dizziness, weakness, leg swelling and dyspnea over the last 3 weeks. He states he first noticed his symptoms 3 weeks ago when his legs felt heavy and he could see them swell. He began noticing that he was more short of breath with activity and he even felt a little dizzy at times with vision loss from 2 weeks worse on the left. He tried herbal medicine and rest; but neither improved his symptoms.  After 3 weeks, he decided to call his PCP. He was told to go to the ED for further evaluation and management.    Of note, he has not taken medicines for DM2, HFrEF, or CAD in over a year. He states that he was trying to remove "all the chemicals" from his body. He thought that all his conditions were resolved.   Due to his reported generalized weakness, fatigue for 3 weeks and vision loss for 2 weeks, initially a CT head 07/09/2020 was done which showed age indeterminate small vessel ischemic changes throughout the periventricular white matter.  Chronic lacunar infarcts left basal ganglia and right thalamus.  No acute hemorrhage.  MRI brain was done on 07/11/2020 which showed acute or subacute infarction in the deep white matter along the lateral margin of the temporal horn and atrium of the right lateral ventricle.  No evidence of hemorrhage or mass-effect.  Moderate chronic small vessel ischemic changes elsewhere throughout the brain.    2D echo completed on 07/10/2020 showed LVEF 30 to 35%, left ventricle global hypokinesis, left atrial severely dilated, right atrial moderately dilated, mild mitral valve regurgitation, grade 2 diastolic dysfunction.  Cardiology has been consulted and will see in consultation on 07/12/2020.  07/15/20: Denies any chest pain.   Denies dyspnea at rest.  Reports b/l blurry vision of 3 weeks duration, L>R.  Painless.  Has not seen an ophthalmologist but plans to follow up.  Seen by PT recommended outpt PT.    Seen by cardiology.  Discussed with Dr. Rayann Heman, possible cardiac MRI to work up suspected cardiac amyloidosis on 2D echo.    Assessment/Plan with IV: Active Problems:   Acute on chronic congestive heart failure (HCC)   Acute on chronic combined systolic (congestive) and diastolic (congestive) heart failure (HCC)  Acute on chronic combined diastolic and systolic CHF 2D echo completed on 07/10/2020 showed LVEF 30 to 35%, left ventricle global hypokinesis, left atrial severely dilated, right atrial moderately dilated, mild mitral valve regurgitation, grade 2 diastolic dysfunction.  Prior images reviewed side by side. The left ventricular diastolic function is significantly worse. (worsened signs of increased filling pressures). Consider cardiac amyloidosis. Cardiology following.  Discussed with Dr. Rayann Heman, possible cardiac MRI to work up suspected cardiac amyloidosis on 2D echo.  Cardiology will let [facility] know if patient needs to be transferred to [facility].   2D echo done on 11/12/2018 showed LVEF 25 to 30%, Cardiac amyloidosis is a consideration based on the appearance.  Ongoing diuresing with IV Lasix 40 mg twice daily. Net I&O -3.4 L>> -2.4L>> -2.7L, unclear if UO recorded is accurate Continue strict I's and O's and daily weight Still volume + Unna boots in place  Possible cardiac amyloidosis seen on 2D echo Defer management to cardiology  Coronary artery disease status post PCI with stent placement Denies any  anginal symptoms at the time of this exam Continue cardiac medications, ASA 162 mg daily, crestor 40 mg daily  Acute or subacute infarction in the deep white matter along the lateral margin of the temporal horn and atrium of the right lateral ventricle seen on MRI brain 07/11/2020.   No evidence of hemorrhage or  mass-effect.  Moderate chronic small vessel ischemic changes elsewhere throughout the brain.   LDL 92, goal less than 70 Hemoglobin A1c 9.2 with a goal of less than 7.0 Increased dose of Crestor to 40 mg daily Continue aspirin 162 mg daily No reported intracardiac thrombus or PFO on 2D echo done on 07/10/2020. PT recommended outpatient PT Discussed with neuro, will obtain MRA head and neck. MRA head wo contrast/MRA w/wo contrast completed on 07/13/20 revealed: No evidence of hemodynamically significant stenosis of the carotid or vertebral arteries in the neck.  Luminal irregularity of the bilateral ACA and MCA vascular trees, suggestive of atherosclerotic disease, with moderate stenosis at the distal bilateral M1/MCA and bilateral A3/ACA.  Mild stenosis in the mid basilar artery. Will need to follow up with neurology outpatient.  Painless B/L eye blurriness L>R Recommend to follow up with ophthalmologist, he is agreeable  Type 2 diabetes with hyperglycemia Hemoglobin A1c not at goal Continue insulin sliding scale Continue Lantus  Hyperlipidemia LDL is not at goal Crestor dose increased to 40 mg daily.  Chronic normocytic anemia Resume home medications No evidence of overt bleeding  Chronic right lower extremity wound secondary to skin picking Continue local wound care as recommended by wound care specialist.  Constipation, improving Continue bowel regimen  Medication noncompliance Patient stopped taking his current medication for over a year, stated he wanted to remove all the chemicals from his body.      Code Status: Full code  Family Communication: None at bedside.  Disposition Plan: Likely will discharge to home when cardiology signs off.  Consultants:  Cardiology  Neurology  Procedures:  None  Antimicrobials:  None  DVT prophylaxis: Subcu Lovenox daily  Status is: Inpatient.    Dispo:  Patient From: Home  Planned Disposition: Home  Expected  discharge date: 07/16/20  Medically stable for discharge: No, ongoing diuresing and management of combined systolic and diastolic CHF by cardiology, possible evaluation for cardiac amyloidosis.    Objective: Vitals:   07/14/20 1840 07/15/20 0541 07/15/20 0806 07/15/20 1302  BP: (!) 140/94 (!) 149/73 (!) 168/101 132/64  Pulse:  71 69 64  Resp:  18  17  Temp:  98.2 F (36.8 C)  98.5 F (36.9 C)  TempSrc:    Oral  SpO2:  95%  98%  Weight:      Height:        Intake/Output Summary (Last 24 hours) at 07/15/2020 1702 Last data filed at 07/15/2020 1300 Gross per 24 hour  Intake 960 ml  Output 950 ml  Net 10 ml   Filed Weights   07/09/20 1741 07/11/20 0551 07/14/20 0500  Weight: 63.5 kg 65.4 kg 68.4 kg    Exam:  . General: 69 y.o. year-old male well-developed well-nourished in no acute distress.  Alert oriented x3. . Cardiovascular: Regular rate and rhythm no rubs or gallops. Marland Kitchen Respiratory: Clear to auscultation no wheezes or rales.  Abdomen: Bowel sounds present.  Nontender.  Nondistended.  . Musculoskeletal: No extremity edema bilaterally.  He has Unna boots in place..  . Psychiatry: Mood is appropriate for condition and setting.  Data Reviewed: CBC: Recent Labs  Lab 07/09/20 1752  07/10/20 1116 07/10/20 1310 07/11/20 0532  WBC 5.8 5.7 5.7 4.9  NEUTROABS 4.7 4.4  --   --   HGB 13.7 12.2* 12.4* 12.3*  HCT 41.5 36.1* 38.1* 37.5*  MCV 93.9 94.0 95.0 95.7  PLT 186 164 165 470   Basic Metabolic Panel: Recent Labs  Lab 07/10/20 1116 07/10/20 1310 07/11/20 0532 07/12/20 0510 07/13/20 0509 07/14/20 0651 07/15/20 0600  NA 141   < > 137 138 141 139 138  K 3.7   < > 3.6 3.3* 3.5 3.8 4.4  CL 103   < > 101 100 103 101 102  CO2 33*   < > 30 29 31  32 30  GLUCOSE 221*   < > 131* 271* 253* 127* 267*  BUN 16   < > 26* 24* 27* 27* 27*  CREATININE 0.93   < > 1.08 1.03 0.99 1.01 1.04  CALCIUM 8.4*   < > 8.0* 8.3* 8.4* 8.3* 8.4*  MG 1.7  --   --  1.7  --   --   --    < >  = values in this interval not displayed.   GFR: Estimated Creatinine Clearance: 66.7 mL/min (by C-G formula based on SCr of 1.04 mg/dL). Liver Function Tests: Recent Labs  Lab 07/09/20 1752 07/10/20 1116 07/11/20 0532  AST 16 14* 15  ALT 12 9 11   ALKPHOS 110 94 87  BILITOT 1.0 1.0 0.8  PROT 6.9 5.5* 5.9*  ALBUMIN 2.6* 2.1* 2.2*   No results for input(s): LIPASE, AMYLASE in the last 168 hours. No results for input(s): AMMONIA in the last 168 hours. Coagulation Profile: No results for input(s): INR, PROTIME in the last 168 hours. Cardiac Enzymes: No results for input(s): CKTOTAL, CKMB, CKMBINDEX, TROPONINI in the last 168 hours. BNP (last 3 results) No results for input(s): PROBNP in the last 8760 hours. HbA1C: No results for input(s): HGBA1C in the last 72 hours. CBG: Recent Labs  Lab 07/14/20 1635 07/14/20 2056 07/15/20 0735 07/15/20 1209 07/15/20 1652  GLUCAP 150* 214* 224* 106* 152*   Lipid Profile: No results for input(s): CHOL, HDL, LDLCALC, TRIG, CHOLHDL, LDLDIRECT in the last 72 hours. Thyroid Function Tests: No results for input(s): TSH, T4TOTAL, FREET4, T3FREE, THYROIDAB in the last 72 hours. Anemia Panel: No results for input(s): VITAMINB12, FOLATE, FERRITIN, TIBC, IRON, RETICCTPCT in the last 72 hours. Urine analysis:    Component Value Date/Time   COLORURINE YELLOW 07/09/2020 2151   APPEARANCEUR CLEAR 07/09/2020 2151   LABSPEC 1.020 07/09/2020 2151   PHURINE 6.5 07/09/2020 2151   GLUCOSEU >=500 (A) 07/09/2020 2151   HGBUR MODERATE (A) 07/09/2020 2151   BILIRUBINUR NEGATIVE 07/09/2020 2151   [place] NEGATIVE 07/09/2020 2151   PROTEINUR 100 (A) 07/09/2020 2151   NITRITE NEGATIVE 07/09/2020 2151   LEUKOCYTESUR NEGATIVE 07/09/2020 2151   Sepsis Labs: @LABRCNTIP (procalcitonin:4,lacticidven:4)  ) Recent Results (from the past 240 hour(s))  Respiratory Panel by RT PCR (Flu A&B, Covid) - Nasopharyngeal Swab     Status: None   Collection Time:  07/09/20  9:51 PM   Specimen: Nasopharyngeal Swab  Result Value Ref Range Status   SARS Coronavirus 2 by RT PCR NEGATIVE NEGATIVE Final    Comment: (NOTE) SARS-CoV-2 target nucleic acids are NOT DETECTED.  The SARS-CoV-2 RNA is generally detectable in upper respiratoy specimens during the acute phase of infection. The lowest concentration of SARS-CoV-2 viral copies this assay can detect is 131 copies/mL. A negative result does not preclude SARS-Cov-2 infection and should  not be used as the sole basis for treatment or other patient management decisions. A negative result may occur with  improper specimen collection/handling, submission of specimen other than nasopharyngeal swab, presence of viral mutation(s) within the areas targeted by this assay, and inadequate number of viral copies (<131 copies/mL). A negative result must be combined with clinical observations, patient history, and epidemiological information. The expected result is Negative.  Fact Sheet for Patients:  PinkCheek.be  Fact Sheet for Healthcare Providers:  GravelBags.it  This test is no t yet approved or cleared by the [place] FDA and  has been authorized for detection and/or diagnosis of SARS-CoV-2 by FDA under an Emergency Use Authorization (EUA). This EUA will remain  in effect (meaning this test can be used) for the duration of the COVID-19 declaration under Section 564(b)(1) of the Act, 21 U.S.C. section 360bbb-3(b)(1), unless the authorization is terminated or revoked sooner.     Influenza A by PCR NEGATIVE NEGATIVE Final   Influenza B by PCR NEGATIVE NEGATIVE Final    Comment: (NOTE) The Xpert Xpress SARS-CoV-2/FLU/RSV assay is intended as an aid in  the diagnosis of influenza from Nasopharyngeal swab specimens and  should not be used as a sole basis for treatment. Nasal washings and  aspirates are unacceptable for Xpert Xpress  SARS-CoV-2/FLU/RSV  testing.  Fact Sheet for Patients: PinkCheek.be  Fact Sheet for Healthcare Providers: GravelBags.it  This test is not yet approved or cleared by the [place] FDA and  has been authorized for detection and/or diagnosis of SARS-CoV-2 by  FDA under an Emergency Use Authorization (EUA). This EUA will remain  in effect (meaning this test can be used) for the duration of the  Covid-19 declaration under Section 564(b)(1) of the Act, 21  U.S.C. section 360bbb-3(b)(1), unless the authorization is  terminated or revoked. Performed at [facility], [address] [place], [place] [address]       Studies: No results found.  Scheduled Meds: . aspirin EC  81 mg Oral Daily  . carvedilol  25 mg Oral BID WC  . enoxaparin (LOVENOX) injection  40 mg Subcutaneous Q24H  . furosemide  40 mg Intravenous BID  . insulin aspart  0-15 Units Subcutaneous TID WC  . insulin aspart  0-5 Units Subcutaneous QHS  . insulin glargine  10 Units Subcutaneous BID  . rosuvastatin  40 mg Oral q1800  . sacubitril-valsartan  1 tablet Oral BID  . senna-docusate  2 tablet Oral BID  . spironolactone  25 mg Oral Daily    Continuous Infusions:    LOS: 4 days     Kayleen Memos, MD [facility] Pager [phone]  If 7PM-7AM, please contact night-coverage [email or web address] Password [email or web address] 07/15/2020, 5:02 PM

## 2020-07-16 ENCOUNTER — Inpatient Hospital Stay (HOSPITAL_COMMUNITY)
Admit: 2020-07-16 | Discharge: 2020-07-16 | Disposition: A | Discharge status: Home / Self Care | Payer: Medicare Other | Attending: Cardiovascular Disease | Admitting: Cardiovascular Disease

## 2020-07-16 ENCOUNTER — Ambulatory Visit (HOSPITAL_COMMUNITY)
Admission: RE | Admit: 2020-07-16 | Discharge: 2020-07-16 | Disposition: A | Discharge status: Home / Self Care | Payer: Medicare Other | Source: Ambulatory Visit | Attending: Cardiovascular Disease | Admitting: Cardiovascular Disease

## 2020-07-16 DIAGNOSIS — I10 Essential (primary) hypertension: Secondary | ICD-10-CM

## 2020-07-16 DIAGNOSIS — I2510 Atherosclerotic heart disease of native coronary artery without angina pectoris: Secondary | ICD-10-CM | POA: Diagnosis not present

## 2020-07-16 DIAGNOSIS — I517 Cardiomegaly: Secondary | ICD-10-CM

## 2020-07-16 DIAGNOSIS — I5043 Acute on chronic combined systolic (congestive) and diastolic (congestive) heart failure: Secondary | ICD-10-CM | POA: Diagnosis not present

## 2020-07-16 LAB — GLUCOSE, CAPILLARY
Glucose-Capillary: 221 mg/dL — ABNORMAL HIGH (ref 70–99)
Glucose-Capillary: 81 mg/dL (ref 70–99)
Glucose-Capillary: 166 mg/dL — ABNORMAL HIGH (ref 70–99)
Glucose-Capillary: 149 mg/dL — ABNORMAL HIGH (ref 70–99)

## 2020-07-16 LAB — BASIC METABOLIC PANEL
Anion gap: 7 (ref 5–15)
BUN: 37 mg/dL — ABNORMAL HIGH (ref 8–23)
CO2: 30 mmol/L (ref 22–32)
Calcium: 8.6 mg/dL — ABNORMAL LOW (ref 8.9–10.3)
Chloride: 101 mmol/L (ref 98–111)
Creatinine, Ser: 1.08 mg/dL (ref 0.61–1.24)
GFR, Estimated: >60 mL/min (ref >60)
Glucose, Bld: 259 mg/dL — ABNORMAL HIGH (ref 70–99)
Potassium: 3.8 mmol/L (ref 3.5–5.1)
Sodium: 138 mmol/L (ref 135–145)

## 2020-07-16 MED ORDER — TECHNETIUM TC 99M PYROPHOSPHATE
21.0000 | Freq: Once | INTRAVENOUS | Status: AC | PRN
  Administered 2020-07-16: 21 via INTRAVENOUS
  Filled 2020-07-16: qty 21

## 2020-07-16 NOTE — Progress Notes (Signed)
[facility] transport arranged for patient to go to [facility]. Zymeir Salminen, Laurel Dimmer, RN

## 2020-07-16 NOTE — Progress Notes (Signed)
PT Cancellation Note  Patient Details Name: Earl Calderon MRN: [id number] DOB: May 10, 1951   Cancelled Treatment:    Reason Eval/Treat Not Completed: Patient at procedure or test/unavailable--pt at [facility] for a procedure. Will check back as schedule allow. Thanks.    [facility]  Office: [phone] Pager: [phone]

## 2020-07-16 NOTE — Care Management Important Message (Signed)
Important Message  Patient Details IM Letter given to the Patient. Name: Earl Calderon MRN: [id number] Date of Birth: May 02, 1951   Medicare Important Message Given:  Yes     Kerin Salen 07/16/2020, 12:14 PM

## 2020-07-16 NOTE — Plan of Care (Signed)
  Problem: Activity: Goal: Capacity to carry out activities will improve Outcome: Progressing   Problem: Activity: Goal: Risk for activity intolerance will decrease Outcome: Progressing   Problem: Education: Goal: Knowledge of General Education information will improve Description: Including pain rating scale, medication(s)/side effects and non-pharmacologic comfort measures Outcome: Completed/Met   Problem: Nutrition: Goal: Adequate nutrition will be maintained Outcome: Completed/Met   Problem: Coping: Goal: Level of anxiety will decrease Outcome: Completed/Met   Problem: Pain Managment: Goal: General experience of comfort will improve Outcome: Completed/Met

## 2020-07-16 NOTE — Discharge Instructions (Signed)
Heart Failure, Diagnosis  Heart failure means that your heart is not able to pump blood in the right way. This makes it hard for your body to work well. Heart failure is usually a long-term (chronic) condition. You must take good care of yourself and follow your treatment plan from your doctor. What are the causes? This condition may be caused by:  High blood pressure.  Build up of cholesterol and fat in the arteries.  Heart attack. This injures the heart muscle.  Heart valves that do not open and close properly.  Damage of the heart muscle. This is also called cardiomyopathy.  Lung disease.  Abnormal heart rhythms. What increases the risk? The risk of heart failure goes up as a person ages. This condition is also more likely to develop in people who:  Are overweight.  Are male.  Smoke or chew tobacco.  Abuse alcohol or illegal drugs.  Have taken medicines that can damage the heart.  Have diabetes.  Have abnormal heart rhythms.  Have thyroid problems.  Have low blood counts (anemia). What are the signs or symptoms? Symptoms of this condition include:  Shortness of breath.  Coughing.  Swelling of the feet, ankles, legs, or belly.  Losing weight for no reason.  Trouble breathing.  Waking from sleep because of the need to sit up and get more air.  Rapid heartbeat.  Being very tired.  Feeling dizzy, or feeling like you may pass out (faint).  Having no desire to eat.  Feeling like you may vomit (nauseous).  Peeing (urinating) more at night.  Feeling confused. How is this treated?     This condition may be treated with:  Medicines. These can be given to treat blood pressure and to make the heart muscles stronger.  Changes in your daily life. These may include eating a healthy diet, staying at a healthy body weight, quitting tobacco and illegal drug use, or doing exercises.  Surgery. Surgery can be done to open blocked valves, or to put devices in  the heart, such as pacemakers.  A donor heart (heart transplant). You will receive a healthy heart from a donor. Follow these instructions at home:  Treat other conditions as told by your doctor. These may include high blood pressure, diabetes, thyroid disease, or abnormal heart rhythms.  Learn as much as you can about heart failure.  Get support as you need it.  Keep all follow-up visits as told by your doctor. This is important. Summary  Heart failure means that your heart is not able to pump blood in the right way.  This condition is caused by high blood pressure, heart attack, or damage of the heart muscle.  Symptoms of this condition include shortness of breath and swelling of the feet, ankles, legs, or belly. You may also feel very tired or feel like you may vomit.  You may be treated with medicines, surgery, or changes in your daily life.  Treat other health conditions as told by your doctor. This information is not intended to replace advice given to you by your health care provider. Make sure you discuss any questions you have with your health care provider. Document Revised: 11/05/2018 Document Reviewed: 11/05/2018 Elsevier Patient Education  [place].  Stroke Prevention Some medical conditions and lifestyle choices can lead to a higher risk for a stroke. You can help to prevent a stroke by making nutrition, lifestyle, and other changes. What nutrition changes can be made?   Eat healthy foods. ?  Choose foods that are high in fiber. These include:  Fresh fruits.  Fresh vegetables.  Whole grains. ? Eat at least 5 or more servings of fruits and vegetables each day. Try to fill half of your plate at each meal with fruits and vegetables. ? Choose lean protein foods. These include:  Lowfat (lean) cuts of meat.  Chicken without skin.  Fish.  Tofu.  Beans.  Nuts. ? Eat low-fat dairy products. ? Avoid foods that:  Are high in salt (sodium).  Have  saturated fat.  Have trans fat.  Have cholesterol.  Are processed.  Are premade.  Follow eating guidelines as told by your doctor. These may include: ? Reducing how many calories you eat and drink each day. ? Limiting how much salt you eat or drink each day to 1,500 milligrams (mg). ? Using only healthy fats for cooking. These include:  Olive oil.  Canola oil.  Sunflower oil. ? Counting how many carbohydrates you eat and drink each day. What lifestyle changes can be made?  Try to stay at a healthy weight. Talk to your doctor about what a good weight is for you.  Get at least 30 minutes of moderate physical activity at least 5 days a week. This can include: ? Fast walking. ? Biking. ? Swimming.  Do not use any products that have nicotine or tobacco. This includes cigarettes and e-cigarettes. If you need help quitting, ask your doctor. Avoid being around tobacco smoke in general.  Limit how much alcohol you drink to no more than 1 drink a day for nonpregnant women and 2 drinks a day for men. One drink equals 12 oz of beer, 5 oz of wine, or 1 oz of hard liquor.  Do not use drugs.  Avoid taking birth control pills. Talk to your doctor about the risks of taking birth control pills if: ? You are over 36 years old. ? You smoke. ? You get migraines. ? You have had a blood clot. What other changes can be made?  Manage your cholesterol. ? It is important to eat a healthy diet. ? If your cholesterol cannot be managed through your diet, you may also need to take medicines. Take medicines as told by your doctor.  Manage your diabetes. ? It is important to eat a healthy diet and to exercise regularly. ? If your blood sugar cannot be managed through diet and exercise, you may need to take medicines. Take medicines as told by your doctor.  Control your high blood pressure (hypertension). ? Try to keep your blood pressure below 130/80. This can help lower your risk of  stroke. ? It is important to eat a healthy diet and to exercise regularly. ? If your blood pressure cannot be managed through diet and exercise, you may need to take medicines. Take medicines as told by your doctor. ? Ask your doctor if you should check your blood pressure at home. ? Have your blood pressure checked every year. Do this even if your blood pressure is normal.  Talk to your doctor about getting checked for a sleep disorder. Signs of this can include: ? Snoring a lot. ? Feeling very tired.  Take over-the-counter and prescription medicines only as told by your doctor. These may include aspirin or blood thinners (antiplatelets or anticoagulants).  Make sure that any other medical conditions you have are managed. Where to find more information  American Stroke Association: www.strokeassociation.org  National Stroke Association: www.stroke.org Get help right away if:  You  have any symptoms of stroke. "BE FAST" is an easy way to remember the main warning signs: ? B - Balance. Signs are dizziness, sudden trouble walking, or loss of balance. ? E - Eyes. Signs are trouble seeing or a sudden change in how you see. ? F - Face. Signs are sudden weakness or loss of feeling of the face, or the face or eyelid drooping on one side. ? A - Arms. Signs are weakness or loss of feeling in an arm. This happens suddenly and usually on one side of the body. ? S - Speech. Signs are sudden trouble speaking, slurred speech, or trouble understanding what people say. ? T - Time. Time to call emergency services. Write down what time symptoms started.  You have other signs of stroke, such as: ? A sudden, very bad headache with no known cause. ? Feeling sick to your stomach (nausea). ? Throwing up (vomiting). ? Jerky movements you cannot control (seizure). These symptoms may represent a serious problem that is an emergency. Do not wait to see if the symptoms will go away. Get medical help right away.  Call your local emergency services (911 in the [place] Do not drive yourself to the hospital. Summary  You can prevent a stroke by eating healthy, exercising, not smoking, drinking less alcohol, and treating other health problems, such as diabetes, high blood pressure, or high cholesterol.  Do not use any products that contain nicotine or tobacco, such as cigarettes and e-cigarettes.  Get help right away if you have any signs or symptoms of a stroke. This information is not intended to replace advice given to you by your health care provider. Make sure you discuss any questions you have with your health care provider. Document Revised: 10/14/2018 Document Reviewed: 11/19/2016 Elsevier Patient Education  [place].

## 2020-07-16 NOTE — Progress Notes (Signed)
PROGRESS NOTE  Earl Calderon DEY:[id number] DOB: Nov 27, 1950 DOA: 07/09/2020 PCP: Pcp, No  HPI/Recap of past 24 hours:  Earl Calderon is a 69 y.o. male with medical history significant of CAD, HFrEF 25-30%, IDDM2. Presenting with increasing dizziness, weakness, leg swelling and dyspnea over the last 3 weeks. He states he first noticed his symptoms 3 weeks ago when his legs felt heavy and he could see them swell. He began noticing that he was more short of breath with activity and he even felt a little dizzy at times with vision loss from 2 weeks worse on the left. He tried herbal medicine and rest; but neither improved his symptoms.  After 3 weeks, he decided to call his PCP. He was told to go to the ED for further evaluation and management.    Of note, he has not taken medicines for DM2, HFrEF, or CAD in over a year. He states that he was trying to remove "all the chemicals" from his body. He thought that all his conditions were resolved.   Due to his reported generalized weakness, fatigue for 3 weeks and vision loss for 2 weeks, initially a CT head 07/09/2020 was done which showed age indeterminate small vessel ischemic changes throughout the periventricular white matter.  Chronic lacunar infarcts left basal ganglia and right thalamus.  No acute hemorrhage.  MRI brain was done on 07/11/2020 which showed acute or subacute infarction in the deep white matter along the lateral margin of the temporal horn and atrium of the right lateral ventricle.  No evidence of hemorrhage or mass-effect.  Moderate chronic small vessel ischemic changes elsewhere throughout the brain.    2D echo completed on 07/10/2020 showed LVEF 30 to 35%, left ventricle global hypokinesis, left atrial severely dilated, right atrial moderately dilated, mild mitral valve regurgitation, grade 2 diastolic dysfunction.  Cardiology has been consulted and will see in consultation on 07/12/2020.  Reports b/l blurry vision of 3 weeks  duration, L>R.  Painless, 07/15/20.  Has not seen an ophthalmologist but plans to follow up.  Seen by PT recommended outpt PT.    Seen by cardiology.  Discussed with Dr. Rayann Heman, possible cardiac MRI to work up suspected cardiac amyloidosis on 2D echo.    07/16/20:  No chest pain. No dyspnea at rest.  Still has B/L LE edema on IV lasix 40 mg BID.  Assessment/Plan with IV: Active Problems:   Acute on chronic congestive heart failure (HCC)   Acute on chronic combined systolic (congestive) and diastolic (congestive) heart failure (HCC)  Acute on chronic combined diastolic and systolic CHF 2D echo completed on 07/10/2020 showed LVEF 30 to 35%, left ventricle global hypokinesis, left atrial severely dilated, right atrial moderately dilated, mild mitral valve regurgitation, grade 2 diastolic dysfunction.  Prior images reviewed side by side. The left ventricular diastolic function is significantly worse. (worsened signs of increased filling pressures). Consider cardiac amyloidosis. Cardiology following.  Discussed with Dr. Rayann Heman, possible cardiac MRI to work up suspected cardiac amyloidosis on 2D echo.  Cardiology will let Earl Calderon know if patient needs to be transferred to [facility].   2D echo done on 11/12/2018 showed LVEF 25 to 30%, Cardiac amyloidosis is a consideration based on the appearance.  Ongoing diuresing with IV Lasix 40 mg twice daily. Net I&O -3.4 L>> -2.4L>> -2.7L>> 3.4L, unclear if UO recorded is accurate Continue strict I's and O's and daily weight Still volume + with B/L LE edema. Unna boots in place  Possible cardiac amyloidosis seen on 2D  echo Defer management to cardiology  Coronary artery disease status post PCI with stent placement Denies any anginal symptoms at the time of this exam Continue cardiac medications, ASA 162 mg daily, crestor 40 mg daily  Acute or subacute infarction in the deep white matter along the lateral margin of the temporal horn and atrium of the right lateral  ventricle seen on MRI brain 07/11/2020.   No evidence of hemorrhage or mass-effect.  Moderate chronic small vessel ischemic changes elsewhere throughout the brain.   LDL 92, goal less than 70 Hemoglobin A1c 9.2 with a goal of less than 7.0 Increased dose of Crestor to 40 mg daily Continue aspirin 162 mg daily No reported intracardiac thrombus or PFO on 2D echo done on 07/10/2020. PT recommended outpatient PT Discussed with neuro, will obtain MRA head and neck. MRA head wo contrast/MRA w/wo contrast completed on 07/13/20 revealed: No evidence of hemodynamically significant stenosis of the carotid or vertebral arteries in the neck.  Luminal irregularity of the bilateral ACA and MCA vascular trees, suggestive of atherosclerotic disease, with moderate stenosis at the distal bilateral M1/MCA and bilateral A3/ACA.  Mild stenosis in the mid basilar artery. Will need to follow up with neurology outpatient.  Painless B/L eye blurriness L>R Recommend to follow up with ophthalmologist, he is agreeable  Type 2 diabetes with hyperglycemia Hemoglobin A1c not at goal Continue insulin sliding scale Continue Lantus  Hyperlipidemia LDL is not at goal Crestor dose increased to 40 mg daily.  Chronic normocytic anemia Resume home medications No evidence of overt bleeding  Chronic right lower extremity wound secondary to skin picking Continue local wound care as recommended by wound care specialist.  Constipation, improving Continue bowel regimen  Medication noncompliance Patient stopped taking his current medication for over a year, stated he wanted to remove all the chemicals from his body.      Code Status: Full code  Family Communication: None at bedside.  Disposition Plan: Likely will discharge to home when cardiology signs off.  Consultants:  Cardiology  Neurology  Procedures:  None  Antimicrobials:  None  DVT prophylaxis: Subcu Lovenox daily  Status is:  Inpatient.    Dispo:  Patient From: Home  Planned Disposition: Home  Expected discharge date: 07/17/20  Medically stable for discharge: No, ongoing diuresing and management of combined systolic and diastolic CHF by cardiology, possible evaluation for cardiac amyloidosis.    Objective: Vitals:   07/15/20 0806 07/15/20 1302 07/15/20 2054 07/16/20 0548  BP: (!) 168/101 132/64 140/78 (!) 156/86  Pulse: 69 64 (!) 58 (!) 57  Resp:  17 18 17   Temp:  98.5 F (36.9 C) 97.9 F (36.6 C) 97.6 F (36.4 C)  TempSrc:  Oral Oral Oral  SpO2:  98% 97% 96%  Weight:    63 kg  Height:        Intake/Output Summary (Last 24 hours) at 07/16/2020 1711 Last data filed at 07/16/2020 1244 Gross per 24 hour  Intake 480 ml  Output 1150 ml  Net -670 ml   Filed Weights   07/11/20 0551 07/14/20 0500 07/16/20 0548  Weight: 65.4 kg 68.4 kg 63 kg    Exam:  . General: 69 y.o. year-old male WD WN NAD A& O x 3 . Cardiovascular: RRR no rubs or gallops . Respiratory: CTA no wheezes or rales. .   Abdomen: BS + NT ND  . Musculoskeletal: 2-3+ pitting edema in LE bilaterally  . Psychiatry: Mood is appropriate.  Data Reviewed: CBC: Recent Labs  Lab 07/09/20 1752 07/10/20 1116 07/10/20 1310 07/11/20 0532  WBC 5.8 5.7 5.7 4.9  NEUTROABS 4.7 4.4  --   --   HGB 13.7 12.2* 12.4* 12.3*  HCT 41.5 36.1* 38.1* 37.5*  MCV 93.9 94.0 95.0 95.7  PLT 186 164 165 297   Basic Metabolic Panel: Recent Labs  Lab 07/10/20 1116 07/10/20 1310 07/12/20 0510 07/13/20 0509 07/14/20 0651 07/15/20 0600 07/16/20 0501  NA 141   < > 138 141 139 138 138  K 3.7   < > 3.3* 3.5 3.8 4.4 3.8  CL 103   < > 100 103 101 102 101  CO2 33*   < > 29 31 32 30 30  GLUCOSE 221*   < > 271* 253* 127* 267* 259*  BUN 16   < > 24* 27* 27* 27* 37*  CREATININE 0.93   < > 1.03 0.99 1.01 1.04 1.08  CALCIUM 8.4*   < > 8.3* 8.4* 8.3* 8.4* 8.6*  MG 1.7  --  1.7  --   --   --   --    < > = values in this interval not displayed.    GFR: Estimated Creatinine Clearance: 59.1 mL/min (by C-G formula based on SCr of 1.08 mg/dL). Liver Function Tests: Recent Labs  Lab 07/09/20 1752 07/10/20 1116 07/11/20 0532  AST 16 14* 15  ALT 12 9 11   ALKPHOS 110 94 87  BILITOT 1.0 1.0 0.8  PROT 6.9 5.5* 5.9*  ALBUMIN 2.6* 2.1* 2.2*   No results for input(s): LIPASE, AMYLASE in the last 168 hours. No results for input(s): AMMONIA in the last 168 hours. Coagulation Profile: No results for input(s): INR, PROTIME in the last 168 hours. Cardiac Enzymes: No results for input(s): CKTOTAL, CKMB, CKMBINDEX, TROPONINI in the last 168 hours. BNP (last 3 results) No results for input(s): PROBNP in the last 8760 hours. HbA1C: No results for input(s): HGBA1C in the last 72 hours. CBG: Recent Labs  Lab 07/15/20 1652 07/15/20 2052 07/16/20 0722 07/16/20 1200 07/16/20 1648  GLUCAP 152* 292* 221* 81 166*   Lipid Profile: No results for input(s): CHOL, HDL, LDLCALC, TRIG, CHOLHDL, LDLDIRECT in the last 72 hours. Thyroid Function Tests: No results for input(s): TSH, T4TOTAL, FREET4, T3FREE, THYROIDAB in the last 72 hours. Anemia Panel: No results for input(s): VITAMINB12, FOLATE, FERRITIN, TIBC, IRON, RETICCTPCT in the last 72 hours. Urine analysis:    Component Value Date/Time   COLORURINE YELLOW 07/09/2020 2151   APPEARANCEUR CLEAR 07/09/2020 2151   LABSPEC 1.020 07/09/2020 2151   PHURINE 6.5 07/09/2020 2151   GLUCOSEU >=500 (A) 07/09/2020 2151   HGBUR MODERATE (A) 07/09/2020 2151   BILIRUBINUR NEGATIVE 07/09/2020 2151   [name] NEGATIVE 07/09/2020 2151   PROTEINUR 100 (A) 07/09/2020 2151   NITRITE NEGATIVE 07/09/2020 2151   LEUKOCYTESUR NEGATIVE 07/09/2020 2151   Sepsis Labs: @LABRCNTIP (procalcitonin:4,lacticidven:4)  ) Recent Results (from the past 240 hour(s))  Respiratory Panel by RT PCR (Flu A&B, Covid) - Nasopharyngeal Swab     Status: None   Collection Time: 07/09/20  9:51 PM   Specimen: Nasopharyngeal  Swab  Result Value Ref Range Status   SARS Coronavirus 2 by RT PCR NEGATIVE NEGATIVE Final    Comment: (NOTE) SARS-CoV-2 target nucleic acids are NOT DETECTED.  The SARS-CoV-2 RNA is generally detectable in upper respiratoy specimens during the acute phase of infection. The lowest concentration of SARS-CoV-2 viral copies this assay can detect is 131 copies/mL. A negative result does not preclude SARS-Cov-2  infection and should not be used as the sole basis for treatment or other patient management decisions. A negative result may occur with  improper specimen collection/handling, submission of specimen other than nasopharyngeal swab, presence of viral mutation(s) within the areas targeted by this assay, and inadequate number of viral copies (<131 copies/mL). A negative result must be combined with clinical observations, patient history, and epidemiological information. The expected result is Negative.  Fact Sheet for Patients:  PinkCheek.be  Fact Sheet for Healthcare Providers:  GravelBags.it  This test is no t yet approved or cleared by the [place] FDA and  has been authorized for detection and/or diagnosis of SARS-CoV-2 by FDA under an Emergency Use Authorization (EUA). This EUA will remain  in effect (meaning this test can be used) for the duration of the COVID-19 declaration under Section 564(b)(1) of the Act, 21 U.S.C. section 360bbb-3(b)(1), unless the authorization is terminated or revoked sooner.     Influenza A by PCR NEGATIVE NEGATIVE Final   Influenza B by PCR NEGATIVE NEGATIVE Final    Comment: (NOTE) The Xpert Xpress SARS-CoV-2/FLU/RSV assay is intended as an aid in  the diagnosis of influenza from Nasopharyngeal swab specimens and  should not be used as a sole basis for treatment. Nasal washings and  aspirates are unacceptable for Xpert Xpress SARS-CoV-2/FLU/RSV  testing.  Fact Sheet for  Patients: PinkCheek.be  Fact Sheet for Healthcare Providers: GravelBags.it  This test is not yet approved or cleared by the [place] FDA and  has been authorized for detection and/or diagnosis of SARS-CoV-2 by  FDA under an Emergency Use Authorization (EUA). This EUA will remain  in effect (meaning this test can be used) for the duration of the  Covid-19 declaration under Section 564(b)(1) of the Act, 21  U.S.C. section 360bbb-3(b)(1), unless the authorization is  terminated or revoked. Performed at [facility], [address] [place], [place] [address]       Studies: NM CARDIAC AMYLOID TUMOR LOC INFLAM SPECT 1 DAY  Result Date: 07/16/2020 CLINICAL DATA:  HEART FAILURE. CONCERN FOR CARDIAC AMYLOIDOSIS. EXAM: NUCLEAR MEDICINE TUMOR LOCALIZATION. PYP CARDIAC AMYLOIDOSIS SCAN WITH SPECT TECHNIQUE: Following intravenous administration of radiopharmaceutical, anterior planar images of the chest were obtained. Regions of interest were placed on the heart and contralateral chest wall for quantitative assessment. Additional SPECT imaging of the chest was obtained. RADIOPHARMACEUTICALS:  21.2 mCi TECHNETIUM 99 PYROPHOSPHATE FINDINGS: Planar Visual assessment: Anterior planar imaging demonstrates radiotracer uptake within the heart less than uptake within the adjacent ribs (Grade 1). Quantitative assessment : Quantitative assessment of the cardiac uptake compared to the contralateral chest wall is equal to 1.1 (H/CL = 1.1). SPECT assessment: SPECT imaging of the chest demonstrates trace radiotracer accumulation within the LEFT ventricle. IMPRESSION: Visual and quantitative assessment (grade 1, H/CL equal 1.1) are NOT suggestive of transthyretin amyloidosis. Electronically Signed   By: Suzy Bouchard M.D.   On: 07/16/2020 17:02    Scheduled Meds: . aspirin EC  162 mg Oral Daily  . carvedilol  25 mg Oral BID WC  .  enoxaparin (LOVENOX) injection  40 mg Subcutaneous Q24H  . furosemide  40 mg Intravenous BID  . insulin aspart  0-15 Units Subcutaneous TID WC  . insulin aspart  0-5 Units Subcutaneous QHS  . insulin glargine  10 Units Subcutaneous BID  . rosuvastatin  40 mg Oral q1800  . sacubitril-valsartan  1 tablet Oral BID  . senna-docusate  2 tablet Oral BID  . spironolactone  25 mg Oral Daily    Continuous Infusions:    LOS: 5 days     Kayleen Memos, MD [facility] Pager [phone]  If 7PM-7AM, please contact night-coverage [email or web address] Password [email or web address] 07/16/2020, 5:11 PM

## 2020-07-16 NOTE — Care Management Important Message (Signed)
Important Message  Patient Details IM Letter given to the Patient. Name: Earl Calderon MRN: [id number] Date of Birth: 1950-12-07   Medicare Important Message Given:  Yes     Kerin Salen 07/16/2020, 12:19 PM

## 2020-07-16 NOTE — Progress Notes (Addendum)
Progress Note Elouise Munroe, MD  Subjective   Breathing much better, reports frequent urination  Inpatient Medications    Scheduled Meds: . aspirin EC  162 mg Oral Daily  . carvedilol  25 mg Oral BID WC  . enoxaparin (LOVENOX) injection  40 mg Subcutaneous Q24H  . furosemide  40 mg Intravenous BID  . insulin aspart  0-15 Units Subcutaneous TID WC  . insulin aspart  0-5 Units Subcutaneous QHS  . insulin glargine  10 Units Subcutaneous BID  . rosuvastatin  40 mg Oral q1800  . sacubitril-valsartan  1 tablet Oral BID  . senna-docusate  2 tablet Oral BID  . spironolactone  25 mg Oral Daily   Continuous Infusions:  PRN Meds: acetaminophen **OR** acetaminophen, hydrALAZINE, ondansetron **OR** ondansetron (ZOFRAN) IV   Vital Signs    Vitals:   07/15/20 0806 07/15/20 1302 07/15/20 2054 07/16/20 0548  BP: (!) 168/101 132/64 140/78 (!) 156/86  Pulse: 69 64 (!) 58 (!) 57  Resp:  17 18 17   Temp:  98.5 F (36.9 C) 97.9 F (36.6 C) 97.6 F (36.4 C)  TempSrc:  Oral Oral Oral  SpO2:  98% 97% 96%  Weight:    63 kg  Height:        Intake/Output Summary (Last 24 hours) at 07/16/2020 0803 Last data filed at 07/16/2020 0500 Gross per 24 hour  Intake 840 ml  Output 1350 ml  Net -510 ml   Filed Weights   07/11/20 0551 07/14/20 0500 07/16/20 0548  Weight: 65.4 kg 68.4 kg 63 kg    Telemetry    SR, SBrady, PACs and PVCs  - Personally Reviewed  Cardiac Studies    ECHO: 07/10/2020 1. Left ventricular ejection fraction, by estimation, is 30 to 35%. The  left ventricle has moderately decreased function. The left ventricle  demonstrates global hypokinesis. There is moderate concentric left  ventricular hypertrophy. Left ventricular  diastolic parameters are consistent with Grade II diastolic dysfunction  (pseudonormalization). Elevated left atrial pressure.  2. Right ventricular systolic function is moderately reduced. The right  ventricular size is mildly enlarged.  Mildly increased right ventricular  wall thickness. There is moderately elevated pulmonary artery systolic  pressure. The estimated right  ventricular systolic pressure is 38.1 mmHg.  3. Left atrial size was severely dilated.  4. Right atrial size was moderately dilated.  5. The mitral valve is normal in structure. Mild mitral valve  regurgitation.  6. Tricuspid valve regurgitation is moderate.  7. The aortic valve is tricuspid. Aortic valve regurgitation is not  visualized. No aortic stenosis is present.  8. The inferior vena cava is dilated in size with <50% respiratory  variability, suggesting right atrial pressure of 15 mmHg.   Comparison(s): Prior images reviewed side by side. The left ventricular  diastolic function is significantly worse. (worsened signs of increased  filling pressures). Consider cardiac amyloidosis.   Physical Exam    GEN: No acute distress.   Neck: No JVD seen Cardiac: RRR, no murmur, no rubs, or gallops.  Respiratory: slightly diminished to auscultation bilaterally in the bases, mostly clear GI: Soft, nontender, non-distended  MS: No edema; No deformity. Neuro:  Nonfocal  Psych: Normal affect   Labs    Chemistry Recent Labs  Lab 07/09/20 1752 07/09/20 1752 07/10/20 1116 07/10/20 1310 07/11/20 0532 07/12/20 0510 07/14/20 0651 07/15/20 0600 07/16/20 0501  NA 138   < > 141   < > 137   < > 139 138 138  K  3.6   < > 3.7   < > 3.6   < > 3.8 4.4 3.8  CL 98   < > 103   < > 101   < > 101 102 101  CO2 31   < > 33*   < > 30   < > 32 30 30  GLUCOSE 416*   < > 221*   < > 131*   < > 127* 267* 259*  BUN 14   < > 16   < > 26*   < > 27* 27* 37*  CREATININE 0.96   < > 0.93   < > 1.08   < > 1.01 1.04 1.08  CALCIUM 8.5*   < > 8.4*   < > 8.0*   < > 8.3* 8.4* 8.6*  PROT 6.9  --  5.5*  --  5.9*  --   --   --   --   ALBUMIN 2.6*  --  2.1*  --  2.2*  --   --   --   --   AST 16  --  14*  --  15  --   --   --   --   ALT 12  --  9  --  11  --   --   --   --     ALKPHOS 110  --  94  --  87  --   --   --   --   BILITOT 1.0  --  1.0  --  0.8  --   --   --   --   GFRNONAA >60   < > >60   < > >60   < > >60 >60 >60  ANIONGAP 9   < > 5   < > 6   < > 6 6 7    < > = values in this interval not displayed.     Hematology Recent Labs  Lab 07/10/20 1116 07/10/20 1310 07/11/20 0532  WBC 5.7 5.7 4.9  RBC 3.84* 4.01* 3.92*  HGB 12.2* 12.4* 12.3*  HCT 36.1* 38.1* 37.5*  MCV 94.0 95.0 95.7  MCH 31.8 30.9 31.4  MCHC 33.8 32.5 32.8  RDW 13.0 13.1 13.0  PLT 164 165 180   Lab Results  Component Value Date   CHOL 146 07/12/2020   HDL 41 07/12/2020   LDLCALC 92 07/12/2020   TRIG 66 07/12/2020   CHOLHDL 3.6 07/12/2020   Lab Results  Component Value Date   TSH 2.755 11/11/2018   Lab Results  Component Value Date   HGBA1C 9.2 (H) 07/10/2020      Patient ID   Earl Sills Thompsonis a 69 y.o.malewith a hx of DM2, HTN, HLD, ICM, CAD w/ DES LAD 10/2018,who is being seen today for the evaluation of CHFat the request ofDr. Nevada Crane.  Assessment & Plan    1.  Acute on chronic combined systolic and diastolic CHF/ ischemic CM w/ EF 25-30% 10/2018 echo, now 30-35% - volume status improved - BUN trending up, Cr stable - pt had stopped rx pta, Coreg, Entresto, aldactone, have been restarted and uptitrated for better BP control - concern for cardiac amyloid on echo, MD advise on getting PET CT this admit - MD advise on changing Lasix to po rx  2. CAD - cath 10/2018 when EF found to be 25-30%, s/p mLAD DES, w/ multi-vessel 25-50% lesions, treated medically - no ischemic sx - he had quit taking Brilinta months ago, no  need to start it back - continue ASA at 162 mg qd, BB, statin  3. HL - Crestor restarted  4. DM - poor control w/ A1c 9.2 - per IM  He will need close cardiology follow-up after discharge, will set up appt  Rosaria Ferries, PA-C 07/16/2020 9:56 AM

## 2020-07-17 DIAGNOSIS — I5043 Acute on chronic combined systolic (congestive) and diastolic (congestive) heart failure: Secondary | ICD-10-CM | POA: Diagnosis not present

## 2020-07-17 DIAGNOSIS — I472 Ventricular tachycardia: Secondary | ICD-10-CM

## 2020-07-17 DIAGNOSIS — I10 Essential (primary) hypertension: Secondary | ICD-10-CM | POA: Diagnosis not present

## 2020-07-17 LAB — BASIC METABOLIC PANEL
Anion gap: 5 (ref 5–15)
BUN: 45 mg/dL — ABNORMAL HIGH (ref 8–23)
CO2: 29 mmol/L (ref 22–32)
Calcium: 8.3 mg/dL — ABNORMAL LOW (ref 8.9–10.3)
Chloride: 103 mmol/L (ref 98–111)
Creatinine, Ser: 0.99 mg/dL (ref 0.61–1.24)
GFR, Estimated: >60 mL/min (ref >60)
Glucose, Bld: 182 mg/dL — ABNORMAL HIGH (ref 70–99)
Potassium: 3.7 mmol/L (ref 3.5–5.1)
Sodium: 137 mmol/L (ref 135–145)

## 2020-07-17 LAB — GLUCOSE, CAPILLARY
Glucose-Capillary: 145 mg/dL — ABNORMAL HIGH (ref 70–99)
Glucose-Capillary: 117 mg/dL — ABNORMAL HIGH (ref 70–99)
Glucose-Capillary: 112 mg/dL — ABNORMAL HIGH (ref 70–99)
Glucose-Capillary: 151 mg/dL — ABNORMAL HIGH (ref 70–99)

## 2020-07-17 LAB — BRAIN NATRIURETIC PEPTIDE: B Natriuretic Peptide: 241.4 pg/mL — ABNORMAL HIGH (ref 0.0–100.0)

## 2020-07-17 LAB — MAGNESIUM: Magnesium: 1.6 mg/dL — ABNORMAL LOW (ref 1.7–2.4)

## 2020-07-17 MED ORDER — MAGNESIUM OXIDE 400 (241.3 MG) MG PO TABS
200.0000 mg | ORAL_TABLET | Freq: Every day | ORAL | Status: DC
  Administered 2020-07-17 – 2020-07-19 (×3): 200 mg via ORAL
  Filled 2020-07-17 (×3): qty 1

## 2020-07-17 MED ORDER — FUROSEMIDE 40 MG PO TABS
40.0000 mg | ORAL_TABLET | Freq: Two times a day (BID) | ORAL | Status: DC
  Administered 2020-07-17 – 2020-07-19 (×4): 40 mg via ORAL
  Filled 2020-07-17 (×4): qty 1

## 2020-07-17 MED ORDER — ISOSORB DINITRATE-HYDRALAZINE 20-37.5 MG PO TABS
1.0000 | ORAL_TABLET | Freq: Two times a day (BID) | ORAL | Status: DC
  Administered 2020-07-17 – 2020-07-19 (×5): 1 via ORAL
  Filled 2020-07-17 (×5): qty 1

## 2020-07-17 MED ORDER — POTASSIUM CHLORIDE CRYS ER 20 MEQ PO TBCR
40.0000 meq | EXTENDED_RELEASE_TABLET | Freq: Once | ORAL | Status: AC
  Administered 2020-07-17: 40 meq via ORAL
  Filled 2020-07-17: qty 2

## 2020-07-17 NOTE — Progress Notes (Signed)
PROGRESS NOTE  Earl Calderon XBD:[id number] DOB: 11-30-1950 DOA: 07/09/2020 PCP: Pcp, No  HPI/Recap of past 24 hours:  Earl Calderon is a 69 y.o. male with medical history significant of CAD, medical noncompliance, HFrEF 25-30%, IDDM2. Presenting with increasing dizziness, weakness, leg swelling and dyspnea over the last 3 weeks. He states he first noticed his symptoms 3 weeks ago when his legs felt heavy and he could see them swell. He began noticing that he was more short of breath with activity and he even felt a little dizzy at times with vision loss from 2 weeks worse on the left. He tried herbal medicine and rest; but neither improved his symptoms.  After 3 weeks, he decided to call his PCP. He was told to go to the ED for further evaluation and management.    Of note, he has not taken medicines for DM2, HFrEF, or CAD in over a year. He states that he was trying to remove "all the chemicals" from his body. He thought that all his conditions were resolved.   Due to his reported generalized weakness, fatigue for 3 weeks and vision loss for 2 weeks, initially a CT head 07/09/2020 was done which showed age indeterminate small vessel ischemic changes throughout the periventricular white matter.  Chronic lacunar infarcts left basal ganglia and right thalamus.  No acute hemorrhage.  MRI brain was done on 07/11/2020 which showed acute or subacute infarction in the deep white matter along the lateral margin of the temporal horn and atrium of the right lateral ventricle.  No evidence of hemorrhage or mass-effect.  Moderate chronic small vessel ischemic changes elsewhere throughout the brain.    2D echo completed on 07/10/2020 showed LVEF 30 to 35%, left ventricle global hypokinesis, left atrial severely dilated, right atrial moderately dilated, mild mitral valve regurgitation, grade 2 diastolic dysfunction.  Cardiology has been consulted and will see in consultation on 07/12/2020.  Reports b/l  blurry vision of 3 weeks duration, L>R.  Painless, 07/15/20.  Has not seen an ophthalmologist but plans to follow up.  Seen by PT recommended outpt PT.    Seen by cardiology.  Discussed with Dr. Rayann Heman, possible cardiac MRI to work up suspected cardiac amyloidosis on 2D echo.    07/17/20: No acute events overnight.  He denies any chest pain or dyspnea at rest.  His main complaint today is with his diet.  Requested regular diet, added fluid and salt restriction.  Ongoing titration of cardiac medications by cardiology.   Assessment/Plan with IV: Active Problems:   Acute on chronic congestive heart failure (HCC)   Acute on chronic combined systolic (congestive) and diastolic (congestive) heart failure (HCC)  Acute on chronic combined diastolic and systolic CHF 2D echo completed on 07/10/2020 showed LVEF 30 to 35%, left ventricle global hypokinesis, left atrial severely dilated, right atrial moderately dilated, mild mitral valve regurgitation, grade 2 diastolic dysfunction.  Prior images reviewed side by side. The left ventricular diastolic function is significantly worse. (worsened signs of increased filling pressures). Consider cardiac amyloidosis. Cardiology following.  Discussed with Dr. Rayann Heman, possible cardiac MRI to work up suspected cardiac amyloidosis on 2D echo.  Cardiology will let [facility] know if patient needs to be transferred to [facility].   2D echo done on 11/12/2018 showed LVEF 25 to 30%, Cardiac amyloidosis is a consideration based on the appearance.  Ongoing diuresing with IV Lasix 40 mg twice daily. Net I&O -3.4 L>> -2.4L>> -2.7L>> 3.4L>> -3.3L Continue strict I's and O's and daily weight  Unna boots in place  Questionable cardiac amyloidosis seen on 2D echo PYP is not consistent with cardiac amyloidosis. Defer management to cardiology  Coronary artery disease status post PCI with stent placement Denies any anginal symptoms at the time of this exam Continue cardiac medications, ASA 162 mg  daily, crestor 40 mg daily  Acute or subacute infarction in the deep white matter along the lateral margin of the temporal horn and atrium of the right lateral ventricle seen on MRI brain 07/11/2020.   No evidence of hemorrhage or mass-effect.  Moderate chronic small vessel ischemic changes elsewhere throughout the brain.   LDL 92, goal less than 70 Hemoglobin A1c 9.2 with a goal of less than 7.0 Increased dose of Crestor to 40 mg daily Continue aspirin 162 mg daily No reported intracardiac thrombus or PFO on 2D echo done on 07/10/2020. Discussed with neuro, Dr. Theda Sers, he reviewed his imaging, patient will need to follow up with neurology outpatient. MRA head wo contrast/MRA w/wo contrast completed on 07/13/20 revealed: No evidence of hemodynamically significant stenosis of the carotid or vertebral arteries in the neck.  Luminal irregularity of the bilateral ACA and MCA vascular trees, suggestive of atherosclerotic disease, with moderate stenosis at the distal bilateral M1/MCA and bilateral A3/ACA.  Mild stenosis in the mid basilar artery.  Painless B/L eye blurriness L>R Recommend to follow up with ophthalmologist, he is agreeable  Type 2 diabetes with hyperglycemia Hemoglobin A1c not at goal Continue insulin sliding scale Continue Lantus  Hyperlipidemia LDL is not at goal Crestor dose increased to 40 mg daily.  Chronic normocytic anemia Resume home medications No evidence of overt bleeding  Chronic right lower extremity wound secondary to skin picking Continue local wound care as recommended by wound care specialist.  Constipation, improving Continue bowel regimen  Medication noncompliance Patient stopped taking his current medication for over a year, stated he wanted to remove all the chemicals from his body.      Code Status: Full code  Family Communication: None at bedside.  Disposition Plan: Likely will discharge to home when cardiology signs  off.  Consultants:  Cardiology  Neurology  Procedures:  None  Antimicrobials:  None  DVT prophylaxis: Subcu Lovenox daily  Status is: Inpatient.    Dispo:  Patient From: Home  Planned Disposition: Home  Expected discharge date: 07/18/20  Medically stable for discharge: No, ongoing diuresing and management of combined systolic and diastolic CHF by cardiology, ongoing titration of cardiac medications.    Objective: Vitals:   07/16/20 0548 07/16/20 2109 07/17/20 0500 07/17/20 0505  BP: (!) 156/86 (!) 150/92  (!) 144/76  Pulse: (!) 57 (!) 59  68  Resp: 17 18    Temp: 97.6 F (36.4 C) 97.8 F (36.6 C)    TempSrc: Oral Oral    SpO2: 96% 99%  94%  Weight: 63 kg  65.8 kg   Height:        Intake/Output Summary (Last 24 hours) at 07/17/2020 1053 Last data filed at 07/17/2020 1000 Gross per 24 hour  Intake 240 ml  Output 625 ml  Net -385 ml   Filed Weights   07/14/20 0500 07/16/20 0548 07/17/20 0500  Weight: 68.4 kg 63 kg 65.8 kg    Exam:  No changes from prior exam.  . General: 69 y.o. year-old male WD WN NAD A& O x 3 . Cardiovascular: RRR no rubs or gallops . Respiratory: CTA no wheezes or rales. .   Abdomen: BS + NT ND  .  Musculoskeletal: 2+ pitting edema in LE bilaterally  . Psychiatry: Mood is appropriate.  Data Reviewed: CBC: Recent Labs  Lab 07/10/20 1116 07/10/20 1310 07/11/20 0532  WBC 5.7 5.7 4.9  NEUTROABS 4.4  --   --   HGB 12.2* 12.4* 12.3*  HCT 36.1* 38.1* 37.5*  MCV 94.0 95.0 95.7  PLT 164 165 174   Basic Metabolic Panel: Recent Labs  Lab 07/10/20 1116 07/10/20 1310 07/12/20 0510 07/12/20 0510 07/13/20 0509 07/14/20 0651 07/15/20 0600 07/16/20 0501 07/17/20 0548  NA 141   < > 138   < > 141 139 138 138 137  K 3.7   < > 3.3*   < > 3.5 3.8 4.4 3.8 3.7  CL 103   < > 100   < > 103 101 102 101 103  CO2 33*   < > 29   < > 31 32 30 30 29   GLUCOSE 221*   < > 271*   < > 253* 127* 267* 259* 182*  BUN 16   < > 24*   < > 27* 27*  27* 37* 45*  CREATININE 0.93   < > 1.03   < > 0.99 1.01 1.04 1.08 0.99  CALCIUM 8.4*   < > 8.3*   < > 8.4* 8.3* 8.4* 8.6* 8.3*  MG 1.7  --  1.7  --   --   --   --   --  1.6*   < > = values in this interval not displayed.   GFR: Estimated Creatinine Clearance: 67.4 mL/min (by C-G formula based on SCr of 0.99 mg/dL). Liver Function Tests: Recent Labs  Lab 07/10/20 1116 07/11/20 0532  AST 14* 15  ALT 9 11  ALKPHOS 94 87  BILITOT 1.0 0.8  PROT 5.5* 5.9*  ALBUMIN 2.1* 2.2*   No results for input(s): LIPASE, AMYLASE in the last 168 hours. No results for input(s): AMMONIA in the last 168 hours. Coagulation Profile: No results for input(s): INR, PROTIME in the last 168 hours. Cardiac Enzymes: No results for input(s): CKTOTAL, CKMB, CKMBINDEX, TROPONINI in the last 168 hours. BNP (last 3 results) No results for input(s): PROBNP in the last 8760 hours. HbA1C: No results for input(s): HGBA1C in the last 72 hours. CBG: Recent Labs  Lab 07/16/20 0722 07/16/20 1200 07/16/20 1648 07/16/20 2106 07/17/20 0802  GLUCAP 221* 81 166* 149* 145*   Lipid Profile: No results for input(s): CHOL, HDL, LDLCALC, TRIG, CHOLHDL, LDLDIRECT in the last 72 hours. Thyroid Function Tests: No results for input(s): TSH, T4TOTAL, FREET4, T3FREE, THYROIDAB in the last 72 hours. Anemia Panel: No results for input(s): VITAMINB12, FOLATE, FERRITIN, TIBC, IRON, RETICCTPCT in the last 72 hours. Urine analysis:    Component Value Date/Time   COLORURINE YELLOW 07/09/2020 2151   APPEARANCEUR CLEAR 07/09/2020 2151   LABSPEC 1.020 07/09/2020 2151   PHURINE 6.5 07/09/2020 2151   GLUCOSEU >=500 (A) 07/09/2020 2151   HGBUR MODERATE (A) 07/09/2020 2151   BILIRUBINUR NEGATIVE 07/09/2020 2151   [place] NEGATIVE 07/09/2020 2151   PROTEINUR 100 (A) 07/09/2020 2151   NITRITE NEGATIVE 07/09/2020 2151   LEUKOCYTESUR NEGATIVE 07/09/2020 2151   Sepsis Labs: @LABRCNTIP (procalcitonin:4,lacticidven:4)  ) Recent  Results (from the past 240 hour(s))  Respiratory Panel by RT PCR (Flu A&B, Covid) - Nasopharyngeal Swab     Status: None   Collection Time: 07/09/20  9:51 PM   Specimen: Nasopharyngeal Swab  Result Value Ref Range Status   SARS Coronavirus 2 by RT PCR NEGATIVE  NEGATIVE Final    Comment: (NOTE) SARS-CoV-2 target nucleic acids are NOT DETECTED.  The SARS-CoV-2 RNA is generally detectable in upper respiratoy specimens during the acute phase of infection. The lowest concentration of SARS-CoV-2 viral copies this assay can detect is 131 copies/mL. A negative result does not preclude SARS-Cov-2 infection and should not be used as the sole basis for treatment or other patient management decisions. A negative result may occur with  improper specimen collection/handling, submission of specimen other than nasopharyngeal swab, presence of viral mutation(s) within the areas targeted by this assay, and inadequate number of viral copies (<131 copies/mL). A negative result must be combined with clinical observations, patient history, and epidemiological information. The expected result is Negative.  Fact Sheet for Patients:  PinkCheek.be  Fact Sheet for Healthcare Providers:  GravelBags.it  This test is no t yet approved or cleared by the [place] FDA and  has been authorized for detection and/or diagnosis of SARS-CoV-2 by FDA under an Emergency Use Authorization (EUA). This EUA will remain  in effect (meaning this test can be used) for the duration of the COVID-19 declaration under Section 564(b)(1) of the Act, 21 U.S.C. section 360bbb-3(b)(1), unless the authorization is terminated or revoked sooner.     Influenza A by PCR NEGATIVE NEGATIVE Final   Influenza B by PCR NEGATIVE NEGATIVE Final    Comment: (NOTE) The Xpert Xpress SARS-CoV-2/FLU/RSV assay is intended as an aid in  the diagnosis of influenza from Nasopharyngeal swab  specimens and  should not be used as a sole basis for treatment. Nasal washings and  aspirates are unacceptable for Xpert Xpress SARS-CoV-2/FLU/RSV  testing.  Fact Sheet for Patients: PinkCheek.be  Fact Sheet for Healthcare Providers: GravelBags.it  This test is not yet approved or cleared by the [place] FDA and  has been authorized for detection and/or diagnosis of SARS-CoV-2 by  FDA under an Emergency Use Authorization (EUA). This EUA will remain  in effect (meaning this test can be used) for the duration of the  Covid-19 declaration under Section 564(b)(1) of the Act, 21  U.S.C. section 360bbb-3(b)(1), unless the authorization is  terminated or revoked. Performed at [facility], [address] [place], [place] [address]       Studies: NM CARDIAC AMYLOID TUMOR LOC INFLAM SPECT 1 DAY  Result Date: 07/16/2020 CLINICAL DATA:  HEART FAILURE. CONCERN FOR CARDIAC AMYLOIDOSIS. EXAM: NUCLEAR MEDICINE TUMOR LOCALIZATION. PYP CARDIAC AMYLOIDOSIS SCAN WITH SPECT TECHNIQUE: Following intravenous administration of radiopharmaceutical, anterior planar images of the chest were obtained. Regions of interest were placed on the heart and contralateral chest wall for quantitative assessment. Additional SPECT imaging of the chest was obtained. RADIOPHARMACEUTICALS:  21.2 mCi TECHNETIUM 99 PYROPHOSPHATE FINDINGS: Planar Visual assessment: Anterior planar imaging demonstrates radiotracer uptake within the heart less than uptake within the adjacent ribs (Grade 1). Quantitative assessment : Quantitative assessment of the cardiac uptake compared to the contralateral chest wall is equal to 1.1 (H/CL = 1.1). SPECT assessment: SPECT imaging of the chest demonstrates trace radiotracer accumulation within the LEFT ventricle. IMPRESSION: Visual and quantitative assessment (grade 1, H/CL equal 1.1) are NOT suggestive of transthyretin  amyloidosis. Electronically Signed   By: Suzy Bouchard M.D.   On: 07/16/2020 17:02    Scheduled Meds: . aspirin EC  162 mg Oral Daily  . carvedilol  25 mg Oral BID WC  . enoxaparin (LOVENOX) injection  40 mg Subcutaneous Q24H  . furosemide  40 mg Intravenous BID  . insulin  aspart  0-15 Units Subcutaneous TID WC  . insulin aspart  0-5 Units Subcutaneous QHS  . insulin glargine  10 Units Subcutaneous BID  . magnesium oxide  200 mg Oral Daily  . potassium chloride  40 mEq Oral Once  . rosuvastatin  40 mg Oral q1800  . sacubitril-valsartan  1 tablet Oral BID  . senna-docusate  2 tablet Oral BID  . spironolactone  25 mg Oral Daily    Continuous Infusions:    LOS: 6 days     Kayleen Memos, MD [facility] Pager [phone]  If 7PM-7AM, please contact night-coverage [email or web address] Password [employer] 07/17/2020, 10:53 AM

## 2020-07-17 NOTE — Progress Notes (Signed)
Tele called and stated that pt had 12 beat run of Vtach. Immediately assessed pt and pt is un-symptomatic. Pt denies chest pain, palpitations, or SOB.  Pt vitals are: BP 144/76, HR 68, SpO2 94% on RA. Lang Snow NP notified via [place]. Spoke with Threasa Beards and she stated that pt will follow up with cardiology. Lab orders placed.

## 2020-07-17 NOTE — Progress Notes (Signed)
Progress Note [name], MD  Subjective   Breathing is ok, but does not feel back to normal. Admits that stopping rx is what got him here.   Inpatient Medications    Scheduled Meds: . aspirin EC  162 mg Oral Daily  . carvedilol  25 mg Oral BID WC  . enoxaparin (LOVENOX) injection  40 mg Subcutaneous Q24H  . furosemide  40 mg Intravenous BID  . insulin aspart  0-15 Units Subcutaneous TID WC  . insulin aspart  0-5 Units Subcutaneous QHS  . insulin glargine  10 Units Subcutaneous BID  . rosuvastatin  40 mg Oral q1800  . sacubitril-valsartan  1 tablet Oral BID  . senna-docusate  2 tablet Oral BID  . spironolactone  25 mg Oral Daily   Continuous Infusions:  PRN Meds: acetaminophen **OR** acetaminophen, hydrALAZINE, ondansetron **OR** ondansetron (ZOFRAN) IV   Vital Signs    Vitals:   07/16/20 0548 07/16/20 2109 07/17/20 0500 07/17/20 0505  BP: (!) 156/86 (!) 150/92  (!) 144/76  Pulse: (!) 57 (!) 59  68  Resp: 17 18    Temp: 97.6 F (36.4 C) 97.8 F (36.6 C)    TempSrc: Oral Oral    SpO2: 96% 99%  94%  Weight: 63 kg  65.8 kg   Height:        Intake/Output Summary (Last 24 hours) at 07/17/2020 0922 Last data filed at 07/16/2020 1945 Gross per 24 hour  Intake --  Output 625 ml  Net -625 ml   Filed Weights   07/14/20 0500 07/16/20 0548 07/17/20 0500  Weight: 68.4 kg 63 kg 65.8 kg    Telemetry    SR, SB low 50s, PVCs are frequent, 12 bt run NSVT  - Personally Reviewed  Cardiac Studies    PYP SCAN: 07/16/2020 FINDINGS: Planar Visual assessment:  Anterior planar imaging demonstrates radiotracer uptake within the heart less than uptake within the adjacent ribs (Grade 1).  Quantitative assessment :  Quantitative assessment of the cardiac uptake compared to the contralateral chest wall is equal to 1.1 (H/CL = 1.1).  SPECT assessment: SPECT imaging of the chest demonstrates trace radiotracer accumulation within the LEFT  ventricle.  IMPRESSION: Visual and quantitative assessment (grade 1, H/CL equal 1.1) are NOT suggestive of transthyretin amyloidosis.  ECHO: 07/10/2020 1. Left ventricular ejection fraction, by estimation, is 30 to 35%. The  left ventricle has moderately decreased function. The left ventricle  demonstrates global hypokinesis. There is moderate concentric left  ventricular hypertrophy. Left ventricular  diastolic parameters are consistent with Grade II diastolic dysfunction  (pseudonormalization). Elevated left atrial pressure.  2. Right ventricular systolic function is moderately reduced. The right  ventricular size is mildly enlarged. Mildly increased right ventricular  wall thickness. There is moderately elevated pulmonary artery systolic  pressure. The estimated right  ventricular systolic pressure is 96.2 mmHg.  3. Left atrial size was severely dilated.  4. Right atrial size was moderately dilated.  5. The mitral valve is normal in structure. Mild mitral valve  regurgitation.  6. Tricuspid valve regurgitation is moderate.  7. The aortic valve is tricuspid. Aortic valve regurgitation is not  visualized. No aortic stenosis is present.  8. The inferior vena cava is dilated in size with <50% respiratory  variability, suggesting right atrial pressure of 15 mmHg.   Comparison(s): Prior images reviewed side by side. The left ventricular  diastolic function is significantly worse. (worsened signs of increased  filling pressures). Consider cardiac amyloidosis.   Physical Exam  General: Well developed, slender, male in no acute distress Head: Eyes PERRLA, Head normocephalic and atraumatic Lungs: essentially clear bilaterally to auscultation. Heart: HRRR S1 S2, without rub or gallop. 2/6 murmur. upper extremity pulses are 2+ & equal. No JVD seen but +HJR. Abdomen: Bowel sounds are present, abdomen soft and non-tender without masses or  hernias noted. Msk: Normal strength  and tone for age. Extremities: No clubbing, cyanosis or edema.    Skin:  No rashes or lesions noted. Neuro: Alert and oriented X 3. Psych:  Good affect, responds appropriately  Labs    Chemistry Recent Labs  Lab 07/10/20 1116 07/10/20 1310 07/11/20 0532 07/12/20 0510 07/15/20 0600 07/16/20 0501 07/17/20 0548  NA 141   < > 137   < > 138 138 137  K 3.7   < > 3.6   < > 4.4 3.8 3.7  CL 103   < > 101   < > 102 101 103  CO2 33*   < > 30   < > 30 30 29   GLUCOSE 221*   < > 131*   < > 267* 259* 182*  BUN 16   < > 26*   < > 27* 37* 45*  CREATININE 0.93   < > 1.08   < > 1.04 1.08 0.99  CALCIUM 8.4*   < > 8.0*   < > 8.4* 8.6* 8.3*  PROT 5.5*  --  5.9*  --   --   --   --   ALBUMIN 2.1*  --  2.2*  --   --   --   --   AST 14*  --  15  --   --   --   --   ALT 9  --  11  --   --   --   --   ALKPHOS 94  --  87  --   --   --   --   BILITOT 1.0  --  0.8  --   --   --   --   GFRNONAA >60   < > >60   < > >60 >60 >60  ANIONGAP 5   < > 6   < > 6 7 5    < > = values in this interval not displayed.     Hematology Recent Labs  Lab 07/10/20 1116 07/10/20 1310 07/11/20 0532  WBC 5.7 5.7 4.9  RBC 3.84* 4.01* 3.92*  HGB 12.2* 12.4* 12.3*  HCT 36.1* 38.1* 37.5*  MCV 94.0 95.0 95.7  MCH 31.8 30.9 31.4  MCHC 33.8 32.5 32.8  RDW 13.0 13.1 13.0  PLT 164 165 180   Lab Results  Component Value Date   CHOL 146 07/12/2020   HDL 41 07/12/2020   LDLCALC 92 07/12/2020   TRIG 66 07/12/2020   CHOLHDL 3.6 07/12/2020   Lab Results  Component Value Date   TSH 2.755 11/11/2018   Lab Results  Component Value Date   HGBA1C 9.2 (H) 07/10/2020   Magnesium  Date Value Ref Range Status  07/17/2020 1.6 (L) 1.7 - 2.4 mg/dL Final    Comment:    Performed at [facility], [place] [address], [place], [place] [address]  07/12/2020 1.7 1.7 - 2.4 mg/dL Final    Comment:    Performed at [facility], [place] [address] [place], [place] [address]  07/10/2020 1.7 1.7 - 2.4  mg/dL Final    Comment:    Performed at [facility], [place] [address], [place], [name] [address]      Patient ID   Earl Calderon a 69 y.o.malewith a hx of DM2, HTN, HLD, ICM, CAD w/ DES LAD 10/2018,who is being seen today for the evaluation of CHFat the request ofDr. [place] Crane.  Assessment & Plan    1.  Acute on chronic combined systolic and diastolic CHF/ ischemic CM w/ EF 25-30% 10/2018 echo, now 30-35% - wt 140 on admit >> 150.79 >> 138.8>> 145.06 - I/O -3.9 L so far, follow - BUN trending up but Cr, Cl, CO2 are stable - PYP scan not c/w amyloid - continue treatment w/ Coreg, Entresto, Aldactone and Lasix - MD advise on starting SGLT2 inhibitor - MD advise if R heart cath would help guide therapy  2. CAD - hx DES LAD 10/2018 - no ischemic sx - on ASA, BB, statin  3. NSVT - aysmptomatic - on max dose Coreg - Mg has been borderline or low, was supplemented this admit - will supplement, will also supplement K+ to keep at 4.0  4. DM - poor control w/ A1c 9.2 - per IM but would benefit from SGLT2 inhibitor  5. HTN - despite restarting and up-titrating meds, SBP 132-168 last 24 hours - diastolic BP elevated as well, up to 101 - MD advise on increasing Entresto to 97-103 mg bid and/or adding Imdur  He will need close cardiology follow-up after discharge, will set up appt  Rosaria Ferries, PA-C 07/17/2020 9:22 AM

## 2020-07-17 NOTE — Progress Notes (Signed)
Physical Therapy Treatment Patient Details Name: Earl Calderon MRN: [id number] DOB: November 09, 1950 Today's Date: 07/17/2020    History of Present Illness 69 y.o. male with medical history significant of CAD, HFrEF 25-30%, IDDM2. Presenting with increasing dizziness, weakness, leg swelling and dyspnea over the last 3 weeks. He states he first noticed his symptoms 3 weeks ago when his legs felt heavy and he could see them swell. He began noticing that he was more short of breath with activity and he even felt a little dizzy at times with vision loss from 2 weeks worse on the left. He tried herbal medicine and rest; but neither improved his symptoms.  After 3 weeks, he decided to call his PCP. He was told to go to the ED for further evaluation and management. Admitted for acute on chronic CHF.  Additionally MRI brain revealed Acute or subacute infarction in the deep white matter along thelateral margin of the temporal horn and atrium of the right lateralventricle.    PT Comments    Pt continues to participate well. He declined use of his cane today although he would very likely benefit from using it. Mild unsteadiness observed during session. Will continue to follow.  **Recommend CM consult-pt expressed concerns about transportation home once discharged**   Follow Up Recommendations  No PT follow up;Supervision - Intermittent     Equipment Recommendations  None recommended by PT    Recommendations for Other Services       Precautions / Restrictions Precautions Precautions: Fall Restrictions Weight Bearing Restrictions: No    Mobility  Bed Mobility               General bed mobility comments: oob in recliner  Transfers Overall transfer level: Modified independent Equipment used: None Transfers: Sit to/from Stand Sit to Stand: Modified independent (Device/Increase time)         General transfer comment: Mildly  Ambulation/Gait Ambulation/Gait assistance: Min  guard Gait Distance (Feet): 100 Feet Assistive device: None Gait Pattern/deviations: Step-through pattern;Decreased stride length     General Gait Details: Intermittent unsteadiness observed. Pt declined use of his cane. No c/o dizziness. HR WNL. No dyspnea noted with ambulation.   Stairs             Wheelchair Mobility    Modified Rankin (Stroke Patients Only)       Balance             Standing balance-Leahy Scale: Fair                              Cognition Arousal/Alertness: Awake/alert Behavior During Therapy: WFL for tasks assessed/performed Overall Cognitive Status: Within Functional Limits for tasks assessed                                        Exercises      General Comments        Pertinent Vitals/Pain Pain Assessment: No/denies pain    Home Living                      Prior Function            PT Goals (current goals can now be found in the care plan section) Progress towards PT goals: Progressing toward goals    Frequency    Min 3X/week  PT Plan Current plan remains appropriate    Co-evaluation              AM-PAC PT "6 Clicks" Mobility   Outcome Measure  Help needed turning from your back to your side while in a flat bed without using bedrails?: None Help needed moving from lying on your back to sitting on the side of a flat bed without using bedrails?: None Help needed moving to and from a bed to a chair (including a wheelchair)?: None Help needed standing up from a chair using your arms (e.g., wheelchair or bedside chair)?: None Help needed to walk in hospital room?: A Little Help needed climbing 3-5 steps with a railing? : A Little 6 Click Score: 22    End of Session   Activity Tolerance: Patient tolerated treatment well Patient left: in chair;with call bell/phone within reach   PT Visit Diagnosis: Unsteadiness on feet (R26.81)     Time: 5366-4403 PT Time  Calculation (min) (ACUTE ONLY): 10 min  Charges:  $Gait Training: 8-22 mins                        Doreatha Massed, PT Acute Rehabilitation  Office: [phone] Pager: [phone]

## 2020-07-18 ENCOUNTER — Encounter (HOSPITAL_COMMUNITY): Payer: Self-pay | Admitting: Internal Medicine

## 2020-07-18 ENCOUNTER — Encounter (HOSPITAL_COMMUNITY)
Admission: EM | Disposition: A | Discharge status: Home / Self Care | Payer: Self-pay | Source: Home / Self Care | Attending: Internal Medicine

## 2020-07-18 DIAGNOSIS — I509 Heart failure, unspecified: Secondary | ICD-10-CM

## 2020-07-18 DIAGNOSIS — I5023 Acute on chronic systolic (congestive) heart failure: Secondary | ICD-10-CM | POA: Diagnosis not present

## 2020-07-18 DIAGNOSIS — I10 Essential (primary) hypertension: Secondary | ICD-10-CM | POA: Diagnosis not present

## 2020-07-18 DIAGNOSIS — I5043 Acute on chronic combined systolic (congestive) and diastolic (congestive) heart failure: Secondary | ICD-10-CM | POA: Diagnosis not present

## 2020-07-18 DIAGNOSIS — I472 Ventricular tachycardia: Secondary | ICD-10-CM | POA: Diagnosis not present

## 2020-07-18 DIAGNOSIS — R0600 Dyspnea, unspecified: Secondary | ICD-10-CM | POA: Diagnosis not present

## 2020-07-18 HISTORY — PX: RIGHT HEART CATH: CATH118263

## 2020-07-18 LAB — BASIC METABOLIC PANEL
Anion gap: 8 (ref 5–15)
BUN: 49 mg/dL — ABNORMAL HIGH (ref 8–23)
CO2: 27 mmol/L (ref 22–32)
Calcium: 8.4 mg/dL — ABNORMAL LOW (ref 8.9–10.3)
Chloride: 106 mmol/L (ref 98–111)
Creatinine, Ser: 1.2 mg/dL (ref 0.61–1.24)
GFR, Estimated: >60 mL/min (ref >60)
Glucose, Bld: 113 mg/dL — ABNORMAL HIGH (ref 70–99)
Potassium: 3.9 mmol/L (ref 3.5–5.1)
Sodium: 141 mmol/L (ref 135–145)

## 2020-07-18 LAB — POCT I-STAT EG7
Acid-Base Excess: 4 mmol/L — ABNORMAL HIGH (ref 0.0–2.0)
Acid-Base Excess: 5 mmol/L — ABNORMAL HIGH (ref 0.0–2.0)
Bicarbonate: 31.1 mmol/L — ABNORMAL HIGH (ref 20.0–28.0)
Bicarbonate: 31.8 mmol/L — ABNORMAL HIGH (ref 20.0–28.0)
Calcium, Ion: 1.25 mmol/L (ref 1.15–1.40)
Calcium, Ion: 1.27 mmol/L (ref 1.15–1.40)
HCT: 34 % — ABNORMAL LOW (ref 39.0–52.0)
HCT: 34 % — ABNORMAL LOW (ref 39.0–52.0)
Hemoglobin: 11.6 g/dL — ABNORMAL LOW (ref 13.0–17.0)
Hemoglobin: 11.6 g/dL — ABNORMAL LOW (ref 13.0–17.0)
O2 Saturation: 60 %
O2 Saturation: 60 %
Potassium: 3.9 mmol/L (ref 3.5–5.1)
Potassium: 4 mmol/L (ref 3.5–5.1)
Sodium: 144 mmol/L (ref 135–145)
Sodium: 144 mmol/L (ref 135–145)
TCO2: 33 mmol/L — ABNORMAL HIGH (ref 22–32)
TCO2: 34 mmol/L — ABNORMAL HIGH (ref 22–32)
pCO2, Ven: 58.2 mmHg (ref 44.0–60.0)
pCO2, Ven: 58.8 mmHg (ref 44.0–60.0)
pH, Ven: 7.337 (ref 7.250–7.430)
pH, Ven: 7.341 (ref 7.250–7.430)
pO2, Ven: 34 mmHg (ref 32.0–45.0)
pO2, Ven: 34 mmHg (ref 32.0–45.0)

## 2020-07-18 LAB — GLUCOSE, CAPILLARY
Glucose-Capillary: 113 mg/dL — ABNORMAL HIGH (ref 70–99)
Glucose-Capillary: 200 mg/dL — ABNORMAL HIGH (ref 70–99)
Glucose-Capillary: 192 mg/dL — ABNORMAL HIGH (ref 70–99)
Glucose-Capillary: 204 mg/dL — ABNORMAL HIGH (ref 70–99)
Glucose-Capillary: 179 mg/dL — ABNORMAL HIGH (ref 70–99)

## 2020-07-18 LAB — MAGNESIUM: Magnesium: 1.6 mg/dL — ABNORMAL LOW (ref 1.7–2.4)

## 2020-07-18 SURGERY — RIGHT HEART CATH

## 2020-07-18 MED ORDER — SODIUM CHLORIDE 0.9 % IV SOLN: INTRAVENOUS | Status: DC

## 2020-07-18 MED ORDER — DIPHENHYDRAMINE HCL 50 MG/ML IJ SOLN
INTRAMUSCULAR | Status: DC | PRN
  Administered 2020-07-18: 25 mg via INTRAVENOUS

## 2020-07-18 MED ORDER — LIDOCAINE HCL (PF) 1 % IJ SOLN
INTRAMUSCULAR | Status: DC | PRN
  Administered 2020-07-18: 2 mL

## 2020-07-18 MED ORDER — HEPARIN (PORCINE) IN NACL 1000-0.9 UT/500ML-% IV SOLN
INTRAVENOUS | Status: AC
  Filled 2020-07-18: qty 500

## 2020-07-18 MED ORDER — DIPHENHYDRAMINE HCL 50 MG/ML IJ SOLN
INTRAMUSCULAR | Status: AC
  Filled 2020-07-18: qty 1

## 2020-07-18 MED ORDER — MAGNESIUM SULFATE 2 GM/50ML IV SOLN
2.0000 g | Freq: Once | INTRAVENOUS | Status: AC
  Administered 2020-07-18: 2 g via INTRAVENOUS
  Filled 2020-07-18: qty 50

## 2020-07-18 MED ORDER — ACETAMINOPHEN 325 MG PO TABS
ORAL_TABLET | ORAL | Status: AC
  Filled 2020-07-18: qty 2

## 2020-07-18 MED ORDER — HEPARIN (PORCINE) IN NACL 1000-0.9 UT/500ML-% IV SOLN
INTRAVENOUS | Status: DC | PRN
  Administered 2020-07-18: 500 mL

## 2020-07-18 SURGICAL SUPPLY — 7 items
CATH BALLN WEDGE 5F 110CM (CATHETERS) ×2 IMPLANT
PACK CARDIAC CATHETERIZATION (CUSTOM PROCEDURE TRAY) ×2 IMPLANT
SHEATH GLIDE SLENDER 4/5FR (SHEATH) ×2 IMPLANT
SHEATH PROBE COVER 6X72 (BAG) ×2 IMPLANT
TRANSDUCER W/STOPCOCK (MISCELLANEOUS) ×2 IMPLANT
TUBING ART PRESS 72  MALE/FEM (TUBING) ×3
TUBING ART PRESS 72 MALE/FEM (TUBING) IMPLANT

## 2020-07-18 NOTE — Progress Notes (Signed)
Progress Note  Patient Name: Earl Calderon Date of Encounter: 07/18/2020  [facility] Cardiologist: [name], MD   Subjective   No complaints, no chest pain or SOB  Inpatient Medications    Scheduled Meds: . aspirin EC  162 mg Oral Daily  . carvedilol  25 mg Oral BID WC  . enoxaparin (LOVENOX) injection  40 mg Subcutaneous Q24H  . furosemide  40 mg Oral BID  . insulin aspart  0-15 Units Subcutaneous TID WC  . insulin aspart  0-5 Units Subcutaneous QHS  . insulin glargine  10 Units Subcutaneous BID  . isosorbide-hydrALAZINE  1 tablet Oral BID  . magnesium oxide  200 mg Oral Daily  . rosuvastatin  40 mg Oral q1800  . sacubitril-valsartan  1 tablet Oral BID  . senna-docusate  2 tablet Oral BID  . spironolactone  25 mg Oral Daily   Continuous Infusions:  PRN Meds: acetaminophen **OR** acetaminophen, hydrALAZINE, ondansetron **OR** ondansetron (ZOFRAN) IV   Vital Signs    Vitals:   07/17/20 0505 07/17/20 1400 07/17/20 2035 07/18/20 0500  BP: (!) 144/76 124/64 129/71 (!) 146/74  Pulse: 68 66 64 84  Resp:  18 18   Temp:  98.5 F (36.9 C) 98.1 F (36.7 C) 98 F (36.7 C)  TempSrc:  Oral Oral Oral  SpO2: 94% 99% (!) 88% 94%  Weight:    66.2 kg  Height:        Intake/Output Summary (Last 24 hours) at 07/18/2020 0833 Last data filed at 07/17/2020 2300 Gross per 24 hour  Intake 1200 ml  Output 275 ml  Net 925 ml   Last 3 Weights 07/18/2020 07/17/2020 07/16/2020  Weight (lbs) 146 lb 145 lb 1 oz 138 lb 12.8 oz  Weight (kg) 66.225 kg 65.8 kg 62.959 kg      Telemetry    SR to SB 56 to 65 - Personally Reviewed  ECG    No new - Personally Reviewed  Physical Exam   GEN: No acute distress.   Neck: No JVD sitting up to eat Cardiac: RRR, no murmurs, rubs, or gallops.  Respiratory: Clear to auscultation bilaterally. GI: Soft, nontender, non-distended  MS: No edema; No deformity.una boots in place but Lt is falling off Neuro:  Nonfocal  Psych:  Normal affect   Labs    High Sensitivity Troponin:   Recent Labs  Lab 07/09/20 2151 07/10/20 1116 07/10/20 1310  TROPONINIHS 25* 35* 31*      Chemistry Recent Labs  Lab 07/16/20 0501 07/17/20 0548 07/18/20 0535  NA 138 137 141  K 3.8 3.7 3.9  CL 101 103 106  CO2 30 29 27   GLUCOSE 259* 182* 113*  BUN 37* 45* 49*  CREATININE 1.08 0.99 1.20  CALCIUM 8.6* 8.3* 8.4*  GFRNONAA >60 >60 >60  ANIONGAP 7 5 8      HematologyNo results for input(s): WBC, RBC, HGB, HCT, MCV, MCH, MCHC, RDW, PLT in the last 168 hours.  BNP Recent Labs  Lab 07/17/20 0548  BNP 241.4*     DDimer No results for input(s): DDIMER in the last 168 hours.   Radiology    NM CARDIAC AMYLOID TUMOR LOC INFLAM SPECT 1 DAY  Result Date: 07/16/2020 CLINICAL DATA:  HEART FAILURE. CONCERN FOR CARDIAC AMYLOIDOSIS. EXAM: NUCLEAR MEDICINE TUMOR LOCALIZATION. PYP CARDIAC AMYLOIDOSIS SCAN WITH SPECT TECHNIQUE: Following intravenous administration of radiopharmaceutical, anterior planar images of the chest were obtained. Regions of interest were placed on the heart and contralateral chest wall for  quantitative assessment. Additional SPECT imaging of the chest was obtained. RADIOPHARMACEUTICALS:  21.2 mCi TECHNETIUM 99 PYROPHOSPHATE FINDINGS: Planar Visual assessment: Anterior planar imaging demonstrates radiotracer uptake within the heart less than uptake within the adjacent ribs (Grade 1). Quantitative assessment : Quantitative assessment of the cardiac uptake compared to the contralateral chest wall is equal to 1.1 (H/CL = 1.1). SPECT assessment: SPECT imaging of the chest demonstrates trace radiotracer accumulation within the LEFT ventricle. IMPRESSION: Visual and quantitative assessment (grade 1, H/CL equal 1.1) are NOT suggestive of transthyretin amyloidosis. Electronically Signed   By: Suzy Bouchard M.D.   On: 07/16/2020 17:02    Cardiac Studies   NM Cardiac amyloid scan  IMPRESSION: Visual and quantitative  assessment (grade 1, H/CL equal 1.1) are NOT suggestive of transthyretin amyloidosis.   Patient Profile     69 y.o. male with a hx of DM2, HTN, HLD, ICM, CAD w/ DES LAD 10/2018,now admitted with acute CHF with non compliance, CVA.  Assessment & Plan    1.  Acute on chronic combined systolic and diastolic CHF/ ischemic CM w/ EF 25-30% 10/2018 echo, now 30-35% - wt 140 on admit >> 150.79 >> 138.8>> 145.06>>145.64 - I/O -2415 mL so far, follow - BUN trending up but Cr, Cl, CO2 are stable - PYP scan not c/w amyloid - continue treatment w/ Coreg, Entresto, Aldactone and Lasix 40 po BID and BiDil added yesterday - MD advise on starting SGLT2 inhibitor farxiga    2. CAD - hx DES LAD 10/2018 - no ischemic sx - on ASA, BB, statin  3. NSVT none in last 24 hours - aysmptomatic - on max dose Coreg - Mg has been borderline or low, was supplemented this admit now 1.6 will give IV 2Gm - will supplement, will also supplement K+ to keep at 4.0  4. DM - poor control w/ A1c 9.2 - per IM but would benefit from SGLT2 inhibitor  5. HTN - despite restarting and up-titrating meds, SBP 129 to 146  last 24 hours - diastolic stable with bidil  6.  CVA this admit per IM    7.  HLD with LDL of 92 goal < 70 crestor has been increased from 20 to 40 mg recheck H&L in 6 weeks.       For questions or updates, please contact [place] Please consult [email or web address] for contact info under        Signed, Cecilie Kicks, NP  07/18/2020, 8:33 AM

## 2020-07-18 NOTE — Progress Notes (Signed)
PROGRESS NOTE    Earl Calderon  HCW:[id number] DOB: August 06, 1951 DOA: 07/09/2020 PCP: Pcp, No    Brief Narrative:  69 y.o.malewith medical history significant ofCAD, medical noncompliance, HFrEF 25-30%, IDDM2. Presenting with increasing dizziness, weakness, leg swelling and dyspnea over the last 3 weeks. He states he first noticed his symptoms 3 weeks ago when his legs felt heavy and he could see them swell. He began noticing that he was more short of breath with activity and he even felt a little dizzy at times with vision loss from 2 weeks worse on the left. He tried herbal medicine and rest; but neither improved his symptoms.  After 3 weeks, he decided to call his PCP. He was told to go to the ED for further evaluation and management. Pt has since been admitted for management of acute heart failure  Assessment & Plan:   Active Problems:   Acute on chronic congestive heart failure (HCC)   Acute on chronic combined systolic (congestive) and diastolic (congestive) heart failure (HCC)   Dyspnea   Acute on chronic combined diastolic and systolic CHF 2D echo completed on 07/10/2020 showed LVEF 30 to 35%, left ventricle global hypokinesis, left atrial severely dilated, right atrial moderately dilated, mild mitral valve regurgitation, grade 2 diastolic dysfunction.  Prior images reviewed side by side. The left ventricular diastolic function is significantly worse. (worsened signs of increased filling pressures). Consider cardiac amyloidosis.  -Cardiology continues to follow -discussed with Cardiology today. Pt still with evidence of volume overload. Recommendation for RHC noted -will repeat BMET in AM  Questionable cardiac amyloidosis seen on 2D echo -PYP is not consistent with cardiac amyloidosis. -for RHC tomorrow  Coronary artery disease status post PCI with stent placement Continue cardiac medications, ASA 162 mg daily, crestor 40 mg daily Denies chest pain or sob this  AM  Acute or subacute infarction in the deep white matter along the lateral margin of the temporal horn and atrium of the right lateral ventricle seen on MRI brain 07/11/2020.   No evidence of hemorrhage or mass-effect.  Moderate chronic small vessel ischemic changes elsewhere throughout the brain.   LDL 92, goal less than 70 Hemoglobin A1c 9.2 with a goal of less than 7.0 Increased dose of Crestor to 40 mg daily Continue aspirin 162 mg daily No reported intracardiac thrombus or PFO on 2D echo done on 07/10/2020. Dr. [place] Crane discussed with neuro, Dr. Theda Sers, he reviewed his imaging, patient will need to follow up with neurology outpatient. MRA head wo contrast/MRA w/wo contrast completed on 07/13/20 revealed: No evidence of hemodynamically significant stenosis of the carotid or vertebral arteries in the neck.  Luminal irregularity of the bilateral ACA and MCA vascular trees, suggestive of atherosclerotic disease, with moderate stenosis at the distal bilateral M1/MCA and bilateral A3/ACA.  Mild stenosis in the mid basilar artery.  Painless B/L eye blurriness L>R Recommend to follow up with ophthalmologist after dc  Type 2 diabetes with hyperglycemia Hemoglobin A1c noted to be 9.2 Continue insulin sliding scale Continue Lantus Glycemic trends stable at this time  Hyperlipidemia LDL is not at goal Crestor dose recently increased to 40 mg daily.  Chronic normocytic anemia Resume home medications No evidence of overt bleeding  Chronic right lower extremity wound secondary to skin picking Continue local wound care as recommended by wound care specialist.  Constipation, improving Continue bowel regimen as needed  Medication noncompliance Patient stopped taking his current medication for over a year, stated he wanted to remove all the chemicals from  his body.  DVT prophylaxis: Lovenox subq Code Status: Full Family Communication: Pt in room, family not at bedside  Status is:  Inpatient  Remains inpatient appropriate because:Ongoing diagnostic testing needed not appropriate for outpatient work up and Unsafe d/c plan   Dispo:  Patient From: Home  Planned Disposition: Home  Expected discharge date: 07/18/20  Medically stable for discharge: No        Consultants:   Cardiology  Procedures:     Antimicrobials: Anti-infectives (From admission, onward)   None       Subjective: Reports feeling better. Asking about going home this AM  Objective: Vitals:   07/17/20 1400 07/17/20 2035 07/18/20 0500 07/18/20 1233  BP: 124/64 129/71 (!) 146/74 94/63  Pulse: 66 64 84 (!) 51  Resp: 18 18  18   Temp: 98.5 F (36.9 C) 98.1 F (36.7 C) 98 F (36.7 C) (!) 97.5 F (36.4 C)  TempSrc: Oral Oral Oral Oral  SpO2: 99% (!) 88% 94% 99%  Weight:   66.2 kg   Height:        Intake/Output Summary (Last 24 hours) at 07/18/2020 1343 Last data filed at 07/18/2020 1300 Gross per 24 hour  Intake 1680 ml  Output 175 ml  Net 1505 ml   Filed Weights   07/16/20 0548 07/17/20 0500 07/18/20 0500  Weight: 63 kg 65.8 kg 66.2 kg    Examination:  General exam: Appears calm and comfortable  Respiratory system: Clear to auscultation. Respiratory effort normal. Cardiovascular system: S1 & S2 heard, Regular Gastrointestinal system: Abdomen is nondistended, soft and nontender. No organomegaly or masses felt. Normal bowel sounds heard. Central nervous system: Alert and oriented. No focal neurological deficits. Extremities: Symmetric 5 x 5 power. Skin: No rashes, lesions Psychiatry: Judgement and insight appear normal. Mood & affect appropriate.   Data Reviewed: I have personally reviewed following labs and imaging studies  CBC: No results for input(s): WBC, NEUTROABS, HGB, HCT, MCV, PLT in the last 168 hours. Basic Metabolic Panel: Recent Labs  Lab 07/12/20 0510 07/13/20 0509 07/14/20 0651 07/15/20 0600 07/16/20 0501 07/17/20 0548 07/18/20 0535  NA 138    < > 139 138 138 137 141  K 3.3*   < > 3.8 4.4 3.8 3.7 3.9  CL 100   < > 101 102 101 103 106  CO2 29   < > 32 30 30 29 27   GLUCOSE 271*   < > 127* 267* 259* 182* 113*  BUN 24*   < > 27* 27* 37* 45* 49*  CREATININE 1.03   < > 1.01 1.04 1.08 0.99 1.20  CALCIUM 8.3*   < > 8.3* 8.4* 8.6* 8.3* 8.4*  MG 1.7  --   --   --   --  1.6* 1.6*   < > = values in this interval not displayed.   GFR: Estimated Creatinine Clearance: 55.9 mL/min (by C-G formula based on SCr of 1.2 mg/dL). Liver Function Tests: No results for input(s): AST, ALT, ALKPHOS, BILITOT, PROT, ALBUMIN in the last 168 hours. No results for input(s): LIPASE, AMYLASE in the last 168 hours. No results for input(s): AMMONIA in the last 168 hours. Coagulation Profile: No results for input(s): INR, PROTIME in the last 168 hours. Cardiac Enzymes: No results for input(s): CKTOTAL, CKMB, CKMBINDEX, TROPONINI in the last 168 hours. BNP (last 3 results) No results for input(s): PROBNP in the last 8760 hours. HbA1C: No results for input(s): HGBA1C in the last 72 hours. CBG: Recent Labs  Lab  07/17/20 1202 07/17/20 1720 07/17/20 2221 07/18/20 0734 07/18/20 1130  GLUCAP 117* 112* 151* 113* 200*   Lipid Profile: No results for input(s): CHOL, HDL, LDLCALC, TRIG, CHOLHDL, LDLDIRECT in the last 72 hours. Thyroid Function Tests: No results for input(s): TSH, T4TOTAL, FREET4, T3FREE, THYROIDAB in the last 72 hours. Anemia Panel: No results for input(s): VITAMINB12, FOLATE, FERRITIN, TIBC, IRON, RETICCTPCT in the last 72 hours. Sepsis Labs: No results for input(s): PROCALCITON, LATICACIDVEN in the last 168 hours.  Recent Results (from the past 240 hour(s))  Respiratory Panel by RT PCR (Flu A&B, Covid) - Nasopharyngeal Swab     Status: None   Collection Time: 07/09/20  9:51 PM   Specimen: Nasopharyngeal Swab  Result Value Ref Range Status   SARS Coronavirus 2 by RT PCR NEGATIVE NEGATIVE Final    Comment: (NOTE) SARS-CoV-2 target  nucleic acids are NOT DETECTED.  The SARS-CoV-2 RNA is generally detectable in upper respiratoy specimens during the acute phase of infection. The lowest concentration of SARS-CoV-2 viral copies this assay can detect is 131 copies/mL. A negative result does not preclude SARS-Cov-2 infection and should not be used as the sole basis for treatment or other patient management decisions. A negative result may occur with  improper specimen collection/handling, submission of specimen other than nasopharyngeal swab, presence of viral mutation(s) within the areas targeted by this assay, and inadequate number of viral copies (<131 copies/mL). A negative result must be combined with clinical observations, patient history, and epidemiological information. The expected result is Negative.  Fact Sheet for Patients:  PinkCheek.be  Fact Sheet for Healthcare Providers:  GravelBags.it  This test is no t yet approved or cleared by the [place] FDA and  has been authorized for detection and/or diagnosis of SARS-CoV-2 by FDA under an Emergency Use Authorization (EUA). This EUA will remain  in effect (meaning this test can be used) for the duration of the COVID-19 declaration under Section 564(b)(1) of the Act, 21 U.S.C. section 360bbb-3(b)(1), unless the authorization is terminated or revoked sooner.     Influenza A by PCR NEGATIVE NEGATIVE Final   Influenza B by PCR NEGATIVE NEGATIVE Final    Comment: (NOTE) The Xpert Xpress SARS-CoV-2/FLU/RSV assay is intended as an aid in  the diagnosis of influenza from Nasopharyngeal swab specimens and  should not be used as a sole basis for treatment. Nasal washings and  aspirates are unacceptable for Xpert Xpress SARS-CoV-2/FLU/RSV  testing.  Fact Sheet for Patients: PinkCheek.be  Fact Sheet for Healthcare  Providers: GravelBags.it  This test is not yet approved or cleared by the [place] FDA and  has been authorized for detection and/or diagnosis of SARS-CoV-2 by  FDA under an Emergency Use Authorization (EUA). This EUA will remain  in effect (meaning this test can be used) for the duration of the  Covid-19 declaration under Section 564(b)(1) of the Act, 21  U.S.C. section 360bbb-3(b)(1), unless the authorization is  terminated or revoked. Performed at [facility], [address] [place], [place] [address]      Radiology Studies: NM CARDIAC AMYLOID TUMOR LOC INFLAM SPECT 1 DAY  Result Date: 07/16/2020 CLINICAL DATA:  HEART FAILURE. CONCERN FOR CARDIAC AMYLOIDOSIS. EXAM: NUCLEAR MEDICINE TUMOR LOCALIZATION. PYP CARDIAC AMYLOIDOSIS SCAN WITH SPECT TECHNIQUE: Following intravenous administration of radiopharmaceutical, anterior planar images of the chest were obtained. Regions of interest were placed on the heart and contralateral chest wall for quantitative assessment. Additional SPECT imaging of the chest was obtained. RADIOPHARMACEUTICALS:  21.2 mCi TECHNETIUM 99 PYROPHOSPHATE FINDINGS: Planar Visual assessment: Anterior planar imaging demonstrates radiotracer uptake within the heart less than uptake within the adjacent ribs (Grade 1). Quantitative assessment : Quantitative assessment of the cardiac uptake compared to the contralateral chest wall is equal to 1.1 (H/CL = 1.1). SPECT assessment: SPECT imaging of the chest demonstrates trace radiotracer accumulation within the LEFT ventricle. IMPRESSION: Visual and quantitative assessment (grade 1, H/CL equal 1.1) are NOT suggestive of transthyretin amyloidosis. Electronically Signed   By: Suzy Bouchard M.D.   On: 07/16/2020 17:02    Scheduled Meds: . aspirin EC  162 mg Oral Daily  . carvedilol  25 mg Oral BID WC  . enoxaparin (LOVENOX) injection  40 mg Subcutaneous Q24H  . furosemide  40 mg Oral  BID  . insulin aspart  0-15 Units Subcutaneous TID WC  . insulin aspart  0-5 Units Subcutaneous QHS  . insulin glargine  10 Units Subcutaneous BID  . isosorbide-hydrALAZINE  1 tablet Oral BID  . magnesium oxide  200 mg Oral Daily  . rosuvastatin  40 mg Oral q1800  . sacubitril-valsartan  1 tablet Oral BID  . senna-docusate  2 tablet Oral BID  . spironolactone  25 mg Oral Daily   Continuous Infusions: . magnesium sulfate bolus IVPB 2 g (07/18/20 1246)     LOS: 7 days   Marylu Lund, MD [facility] Pager On Amion  If 7PM-7AM, please contact night-coverage 07/18/2020, 1:43 PM

## 2020-07-18 NOTE — Interval H&P Note (Signed)
History and Physical Interval Note:  07/18/2020 3:52 PM  Earl Calderon  has presented today for surgery, with the diagnosis of heart failure.  The various methods of treatment have been discussed with the patient and family. After consideration of risks, benefits and other options for treatment, the patient has consented to  Procedure(s): RIGHT HEART CATH (N/A) as a surgical intervention.  The patient's history has been reviewed, patient examined, no change in status, stable for surgery.  I have reviewed the patient's chart and labs.  Questions were answered to the patient's satisfaction.     Kathlyn [place]

## 2020-07-18 NOTE — Progress Notes (Signed)
Orthopedic Tech Progress Note Patient Details:  Earl Calderon 1950/12/04 [id number]  Ortho Devices Type of Ortho Device: Haematologist Ortho Device/Splint Location: Bilateral unna boots Ortho Device/Splint Interventions: Application   Post Interventions Patient Tolerated: Well Instructions Provided: Care of device   [place] Pink 07/18/2020, 2:40 PM

## 2020-07-18 NOTE — H&P (View-Only) (Signed)
Progress Note  Patient Name: Earl Calderon Date of Encounter: 07/18/2020  [facility] Cardiologist: [name], MD   Subjective   No complaints, no chest pain or SOB  Inpatient Medications    Scheduled Meds: . aspirin EC  162 mg Oral Daily  . carvedilol  25 mg Oral BID WC  . enoxaparin (LOVENOX) injection  40 mg Subcutaneous Q24H  . furosemide  40 mg Oral BID  . insulin aspart  0-15 Units Subcutaneous TID WC  . insulin aspart  0-5 Units Subcutaneous QHS  . insulin glargine  10 Units Subcutaneous BID  . isosorbide-hydrALAZINE  1 tablet Oral BID  . magnesium oxide  200 mg Oral Daily  . rosuvastatin  40 mg Oral q1800  . sacubitril-valsartan  1 tablet Oral BID  . senna-docusate  2 tablet Oral BID  . spironolactone  25 mg Oral Daily   Continuous Infusions:  PRN Meds: acetaminophen **OR** acetaminophen, hydrALAZINE, ondansetron **OR** ondansetron (ZOFRAN) IV   Vital Signs    Vitals:   07/17/20 0505 07/17/20 1400 07/17/20 2035 07/18/20 0500  BP: (!) 144/76 124/64 129/71 (!) 146/74  Pulse: 68 66 64 84  Resp:  18 18   Temp:  98.5 F (36.9 C) 98.1 F (36.7 C) 98 F (36.7 C)  TempSrc:  Oral Oral Oral  SpO2: 94% 99% (!) 88% 94%  Weight:    66.2 kg  Height:        Intake/Output Summary (Last 24 hours) at 07/18/2020 0833 Last data filed at 07/17/2020 2300 Gross per 24 hour  Intake 1200 ml  Output 275 ml  Net 925 ml   Last 3 Weights 07/18/2020 07/17/2020 07/16/2020  Weight (lbs) 146 lb 145 lb 1 oz 138 lb 12.8 oz  Weight (kg) 66.225 kg 65.8 kg 62.959 kg      Telemetry    SR to SB 56 to 65 - Personally Reviewed  ECG    No new - Personally Reviewed  Physical Exam   GEN: No acute distress.   Neck: No JVD sitting up to eat Cardiac: RRR, no murmurs, rubs, or gallops.  Respiratory: Clear to auscultation bilaterally. GI: Soft, nontender, non-distended  MS: No edema; No deformity.una boots in place but Lt is falling off Neuro:  Nonfocal  Psych:  Normal affect   Labs    High Sensitivity Troponin:   Recent Labs  Lab 07/09/20 2151 07/10/20 1116 07/10/20 1310  TROPONINIHS 25* 35* 31*      Chemistry Recent Labs  Lab 07/16/20 0501 07/17/20 0548 07/18/20 0535  NA 138 137 141  K 3.8 3.7 3.9  CL 101 103 106  CO2 30 29 27   GLUCOSE 259* 182* 113*  BUN 37* 45* 49*  CREATININE 1.08 0.99 1.20  CALCIUM 8.6* 8.3* 8.4*  GFRNONAA >60 >60 >60  ANIONGAP 7 5 8      HematologyNo results for input(s): WBC, RBC, HGB, HCT, MCV, MCH, MCHC, RDW, PLT in the last 168 hours.  BNP Recent Labs  Lab 07/17/20 0548  BNP 241.4*     DDimer No results for input(s): DDIMER in the last 168 hours.   Radiology    NM CARDIAC AMYLOID TUMOR LOC INFLAM SPECT 1 DAY  Result Date: 07/16/2020 CLINICAL DATA:  HEART FAILURE. CONCERN FOR CARDIAC AMYLOIDOSIS. EXAM: NUCLEAR MEDICINE TUMOR LOCALIZATION. PYP CARDIAC AMYLOIDOSIS SCAN WITH SPECT TECHNIQUE: Following intravenous administration of radiopharmaceutical, anterior planar images of the chest were obtained. Regions of interest were placed on the heart and contralateral chest wall for  quantitative assessment. Additional SPECT imaging of the chest was obtained. RADIOPHARMACEUTICALS:  21.2 mCi TECHNETIUM 99 PYROPHOSPHATE FINDINGS: Planar Visual assessment: Anterior planar imaging demonstrates radiotracer uptake within the heart less than uptake within the adjacent ribs (Grade 1). Quantitative assessment : Quantitative assessment of the cardiac uptake compared to the contralateral chest wall is equal to 1.1 (H/CL = 1.1). SPECT assessment: SPECT imaging of the chest demonstrates trace radiotracer accumulation within the LEFT ventricle. IMPRESSION: Visual and quantitative assessment (grade 1, H/CL equal 1.1) are NOT suggestive of transthyretin amyloidosis. Electronically Signed   By: Suzy Bouchard M.D.   On: 07/16/2020 17:02    Cardiac Studies   NM Cardiac amyloid scan  IMPRESSION: Visual and quantitative  assessment (grade 1, H/CL equal 1.1) are NOT suggestive of transthyretin amyloidosis.   Patient Profile     69 y.o. male with a hx of DM2, HTN, HLD, ICM, CAD w/ DES LAD 10/2018,now admitted with acute CHF with non compliance, CVA.  Assessment & Plan    1.  Acute on chronic combined systolic and diastolic CHF/ ischemic CM w/ EF 25-30% 10/2018 echo, now 30-35% - wt 140 on admit >> 150.79 >> 138.8>> 145.06>>145.64 - I/O -2415 mL so far, follow - BUN trending up but Cr, Cl, CO2 are stable - PYP scan not c/w amyloid - continue treatment w/ Coreg, Entresto, Aldactone and Lasix 40 po BID and BiDil added yesterday - MD advise on starting SGLT2 inhibitor farxiga    2. CAD - hx DES LAD 10/2018 - no ischemic sx - on ASA, BB, statin  3. NSVT none in last 24 hours - aysmptomatic - on max dose Coreg - Mg has been borderline or low, was supplemented this admit now 1.6 will give IV 2Gm - will supplement, will also supplement K+ to keep at 4.0  4. DM - poor control w/ A1c 9.2 - per IM but would benefit from SGLT2 inhibitor  5. HTN - despite restarting and up-titrating meds, SBP 129 to 146  last 24 hours - diastolic stable with bidil  6.  CVA this admit per IM    7.  HLD with LDL of 92 goal < 70 crestor has been increased from 20 to 40 mg recheck H&L in 6 weeks.       For questions or updates, please contact [place] Please consult [email or web address] for contact info under        Signed, Cecilie Kicks, NP  07/18/2020, 8:33 AM

## 2020-07-19 ENCOUNTER — Encounter (HOSPITAL_COMMUNITY): Payer: Self-pay | Admitting: Cardiovascular Disease

## 2020-07-19 DIAGNOSIS — I5043 Acute on chronic combined systolic (congestive) and diastolic (congestive) heart failure: Secondary | ICD-10-CM | POA: Diagnosis not present

## 2020-07-19 LAB — BASIC METABOLIC PANEL
Anion gap: 8 (ref 5–15)
BUN: 49 mg/dL — ABNORMAL HIGH (ref 8–23)
CO2: 29 mmol/L (ref 22–32)
Calcium: 8.7 mg/dL — ABNORMAL LOW (ref 8.9–10.3)
Chloride: 105 mmol/L (ref 98–111)
Creatinine, Ser: 0.99 mg/dL (ref 0.61–1.24)
GFR, Estimated: >60 mL/min (ref >60)
Glucose, Bld: 133 mg/dL — ABNORMAL HIGH (ref 70–99)
Potassium: 4 mmol/L (ref 3.5–5.1)
Sodium: 142 mmol/L (ref 135–145)

## 2020-07-19 LAB — MAGNESIUM: Magnesium: 2.1 mg/dL (ref 1.7–2.4)

## 2020-07-19 LAB — GLUCOSE, CAPILLARY
Glucose-Capillary: 95 mg/dL (ref 70–99)
Glucose-Capillary: 150 mg/dL — ABNORMAL HIGH (ref 70–99)

## 2020-07-19 MED ORDER — FUROSEMIDE 40 MG PO TABS: 40.0000 mg | ORAL_TABLET | Freq: Every day | ORAL | Status: DC

## 2020-07-19 MED ORDER — ASPIRIN 81 MG PO TBEC: 162.0000 mg | DELAYED_RELEASE_TABLET | Freq: Every day | ORAL | 0 refills | Status: AC

## 2020-07-19 MED ORDER — SACUBITRIL-VALSARTAN 97-103 MG PO TABS: 1.0000 | ORAL_TABLET | Freq: Two times a day (BID) | ORAL | Status: DC

## 2020-07-19 MED ORDER — ISOSORB DINITRATE-HYDRALAZINE 20-37.5 MG PO TABS: 1.0000 | ORAL_TABLET | Freq: Two times a day (BID) | ORAL | 0 refills | Status: DC

## 2020-07-19 MED ORDER — CARVEDILOL 25 MG PO TABS: 25.0000 mg | ORAL_TABLET | Freq: Two times a day (BID) | ORAL | 0 refills | Status: DC

## 2020-07-19 MED ORDER — SACUBITRIL-VALSARTAN 97-103 MG PO TABS: 1.0000 | ORAL_TABLET | Freq: Two times a day (BID) | ORAL | 0 refills | Status: AC

## 2020-07-19 MED ORDER — FUROSEMIDE 40 MG PO TABS: 40.0000 mg | ORAL_TABLET | Freq: Two times a day (BID) | ORAL | 0 refills | Status: DC

## 2020-07-19 MED ORDER — ROSUVASTATIN CALCIUM 40 MG PO TABS: 40.0000 mg | ORAL_TABLET | Freq: Every day | ORAL | 0 refills | Status: DC

## 2020-07-19 MED ORDER — DAPAGLIFLOZIN PROPANEDIOL 10 MG PO TABS: 10.0000 mg | ORAL_TABLET | Freq: Every day | ORAL | Status: DC

## 2020-07-19 MED ORDER — DAPAGLIFLOZIN PROPANEDIOL 10 MG PO TABS: 10.0000 mg | ORAL_TABLET | Freq: Every day | ORAL | 0 refills | Status: AC

## 2020-07-19 NOTE — Progress Notes (Signed)
Progress Note  Patient Name: Earl Calderon Date of Encounter: 07/19/2020  [facility] Cardiologist: [name], MD   Subjective   No complaints  Inpatient Medications    Scheduled Meds: . aspirin EC  162 mg Oral Daily  . carvedilol  25 mg Oral BID WC  . enoxaparin (LOVENOX) injection  40 mg Subcutaneous Q24H  . furosemide  40 mg Oral BID  . insulin aspart  0-15 Units Subcutaneous TID WC  . insulin aspart  0-5 Units Subcutaneous QHS  . insulin glargine  10 Units Subcutaneous BID  . isosorbide-hydrALAZINE  1 tablet Oral BID  . magnesium oxide  200 mg Oral Daily  . rosuvastatin  40 mg Oral q1800  . sacubitril-valsartan  1 tablet Oral BID  . senna-docusate  2 tablet Oral BID  . spironolactone  25 mg Oral Daily   Continuous Infusions:  PRN Meds: acetaminophen **OR** acetaminophen, ondansetron **OR** ondansetron (ZOFRAN) IV   Vital Signs    Vitals:   07/18/20 1620 07/18/20 1730 07/18/20 2052 07/19/20 0510  BP: (!) 182/98 (!) 160/71 137/70 (!) 154/82  Pulse: 60 63 (!) 56 (!) 57  Resp: 15 15 18    Temp:  (!) 97.4 F (36.3 C) 97.6 F (36.4 C) 97.9 F (36.6 C)  TempSrc:  Oral Oral Oral  SpO2: 97% 96% 100% 98%  Weight:      Height:        Intake/Output Summary (Last 24 hours) at 07/19/2020 1005 Last data filed at 07/19/2020 0808 Gross per 24 hour  Intake 610 ml  Output 950 ml  Net -340 ml   Last 3 Weights 07/18/2020 07/17/2020 07/16/2020  Weight (lbs) 146 lb 145 lb 1 oz 138 lb 12.8 oz  Weight (kg) 66.225 kg 65.8 kg 62.959 kg      Telemetry    SR - Personally Reviewed  ECG    No new - Personally Reviewed  Physical Exam   GEN: No acute distress.   Neck: No JVD Cardiac: RRR, no murmurs, rubs, or gallops.  Respiratory: Clear to auscultation bilaterally. GI: Soft, nontender, non-distended  MS: No edema; No deformity. Legs wrapped Neuro:  Nonfocal  Psych: Normal affect   Labs    High Sensitivity Troponin:   Recent Labs  Lab  07/09/20 2151 07/10/20 1116 07/10/20 1310  TROPONINIHS 25* 35* 31*      Chemistry Recent Labs  Lab 07/16/20 0501 07/16/20 0501 07/17/20 0548 07/18/20 0535 07/18/20 1605  NA 138   < > 137 141 144  144  K 3.8   < > 3.7 3.9 3.9  4.0  CL 101  --  103 106  --   CO2 30  --  29 27  --   GLUCOSE 259*  --  182* 113*  --   BUN 37*  --  45* 49*  --   CREATININE 1.08  --  0.99 1.20  --   CALCIUM 8.6*  --  8.3* 8.4*  --   GFRNONAA >60  --  >60 >60  --   ANIONGAP 7  --  5 8  --    < > = values in this interval not displayed.     Hematology Recent Labs  Lab 07/18/20 1605  HGB 11.6*  11.6*  HCT 34.0*  34.0*    BNP Recent Labs  Lab 07/17/20 0548  BNP 241.4*     DDimer No results for input(s): DDIMER in the last 168 hours.   Radiology    CARDIAC  CATHETERIZATION  Result Date: 07/18/2020 Successful right heart catheterization via the right antecubital vein. Mildly elevated filling pressures, mild to moderate pulmonary hypertension and mildly reduced cardiac output. RA: 8 mmHg RV: 53/3 mmHg PCW 15 mmHg PA: 50/17 mmHg with a mean of 29 AO sat 97% PA sat 60% Cardiac output: 3.92 with a cardiac index of 2.15. Recommendations: Hemodynamics appear reasonable.  Continue oral furosemide and heart failure medications.   Cardiac Studies   NM Cardiac amyloid scan  IMPRESSION: Visual and quantitative assessment (grade 1, H/CL equal 1.1) are NOT suggestive of transthyretin amyloidosis.   Cardiac cath right 07/18/20  Successful right heart catheterization via the right antecubital vein.  Mildly elevated filling pressures, mild to moderate pulmonary hypertension and mildly reduced cardiac output.  RA: 8 mmHg RV: 53/3 mmHg PCW 15 mmHg PA: 50/17 mmHg with a mean of 29  AO sat 97% PA sat 60% Cardiac output: 3.92 with a cardiac index of 2.15.  Recommendations: Hemodynamics appear reasonable.  Continue oral furosemide and heart failure medications.  Echo  07/10/20 IMPRESSIONS    1. Left ventricular ejection fraction, by estimation, is 30 to 35%. The  left ventricle has moderately decreased function. The left ventricle  demonstrates global hypokinesis. There is moderate concentric left  ventricular hypertrophy. Left ventricular  diastolic parameters are consistent with Grade II diastolic dysfunction  (pseudonormalization). Elevated left atrial pressure.  2. Right ventricular systolic function is moderately reduced. The right  ventricular size is mildly enlarged. Mildly increased right ventricular  wall thickness. There is moderately elevated pulmonary artery systolic  pressure. The estimated right  ventricular systolic pressure is 40.9 mmHg.  3. Left atrial size was severely dilated.  4. Right atrial size was moderately dilated.  5. The mitral valve is normal in structure. Mild mitral valve  regurgitation.  6. Tricuspid valve regurgitation is moderate.  7. The aortic valve is tricuspid. Aortic valve regurgitation is not  visualized. No aortic stenosis is present.  8. The inferior vena cava is dilated in size with <50% respiratory  variability, suggesting right atrial pressure of 15 mmHg.   Comparison(s): Prior images reviewed side by side. The left ventricular  diastolic function is significantly worse. (worsened signs of increased  filling pressures). Consider cardiac amyloidosis.   FINDINGS  Left Ventricle: Left ventricular ejection fraction, by estimation, is 30  to 35%. The left ventricle has moderately decreased function. The left  ventricle demonstrates global hypokinesis. The left ventricular internal  cavity size was normal in size.  There is moderate concentric left ventricular hypertrophy. Left  ventricular diastolic parameters are consistent with Grade II diastolic  dysfunction (pseudonormalization). Elevated left atrial pressure.   Right Ventricle: The right ventricular size is mildly enlarged. Mildly   increased right ventricular wall thickness. Right ventricular systolic  function is moderately reduced. There is moderately elevated pulmonary  artery systolic pressure. The tricuspid  regurgitant velocity is 3.10 m/s, and with an assumed right atrial  pressure of 15 mmHg, the estimated right ventricular systolic pressure is  81.1 mmHg.   Left Atrium: Left atrial size was severely dilated.   Right Atrium: Right atrial size was moderately dilated.   Pericardium: Trivial pericardial effusion is present.   Mitral Valve: The mitral valve is normal in structure. Mild mitral annular  calcification. Mild mitral valve regurgitation, with centrally-directed  jet.   Tricuspid Valve: The tricuspid valve is normal in structure. Tricuspid  valve regurgitation is moderate.   Aortic Valve: The aortic valve is tricuspid. Aortic valve regurgitation  is  not visualized. No aortic stenosis is present.   Pulmonic Valve: The pulmonic valve was normal in structure. Pulmonic valve  regurgitation is trivial.   Aorta: The aortic root and ascending aorta are structurally normal, with  no evidence of dilitation.  Patient Profile     69 y.o. male with a hx of DM2, HTN, HLD, ICM, CAD w/ DES LAD 10/2018,now admitted with acute CHF with non compliance, CVA.  Assessment & Plan    1. Acute on chronic combined systolic and diastolic CHF/ ischemic CM w/ EF 25-30% 10/2018 echo, now 30-35% - wt 140 on admit >> 150.79 >> 138.8>> 145.06>>145.64 no weight today - I/O -1195 mL so far, - BUN trending up but Cr, Cl, CO2 are stable - PYP scan not c/w amyloid - continue treatment w/ Coreg, Entresto, Aldactone and Lasix 40 po BID and BiDil added  - MD advise on starting SGLT2 inhibitor farxiga  labs ordered but not back   Plan for discharge.  2. CAD - hx DES LAD 10/2018 - no ischemic sx - on ASA, BB, statin  3.NSVT none in last 24 hours - aysmptomatic - on max dose Coreg - Mg has been borderline or  low, was supplemented this admit now 1.6 will give IV 2Gm - will supplement, will also supplement K+ to keep at 4.0  4. DM - poor control w/ A1c 9.2 - per IMbut would benefit from SGLT2 inhibitor  5. HTN - despite restarting and up-titrating meds, SBP 528 to 413 - diastolic stable with bidil  6.  CVA this admit per IM        For questions or updates, please contact [place] Please consult [email or web address] for contact info under        Signed, Cecilie Kicks, NP  07/19/2020, 10:05 AM

## 2020-07-19 NOTE — Progress Notes (Signed)
Patient stable.  IV removed.  RN reviewed Heart Failure material and discharge paperwork with patient and emphasized the importance of taking medications as instructed by MD.  Patient stated understanding.  RN transported patient to main entrance via wheelchair where [facility] was waiting.  [facility] assisted patient into waiting vehicle.  Patient was in possession of all belongings that he had brought to the hospital.

## 2020-07-19 NOTE — Plan of Care (Signed)
  Problem: Education: Goal: Ability to demonstrate management of disease process will improve Outcome: Adequate for Discharge Goal: Ability to verbalize understanding of medication therapies will improve Outcome: Adequate for Discharge Goal: Individualized Educational Video(s) Outcome: Adequate for Discharge   Problem: Cardiac: Goal: Ability to achieve and maintain adequate cardiopulmonary perfusion will improve Outcome: Adequate for Discharge   Problem: Health Behavior/Discharge Planning: Goal: Ability to manage health-related needs will improve Outcome: Adequate for Discharge   Problem: Clinical Measurements: Goal: Ability to maintain clinical measurements within normal limits will improve Outcome: Adequate for Discharge Goal: Will remain free from infection Outcome: Adequate for Discharge Goal: Diagnostic test results will improve Outcome: Adequate for Discharge Goal: Respiratory complications will improve Outcome: Adequate for Discharge Goal: Cardiovascular complication will be avoided Outcome: Adequate for Discharge   Problem: Activity: Goal: Risk for activity intolerance will decrease Outcome: Adequate for Discharge   Problem: Elimination: Goal: Will not experience complications related to bowel motility Outcome: Adequate for Discharge Goal: Will not experience complications related to urinary retention Outcome: Adequate for Discharge   Problem: Safety: Goal: Ability to remain free from injury will improve Outcome: Adequate for Discharge   Problem: Skin Integrity: Goal: Risk for impaired skin integrity will decrease Outcome: Adequate for Discharge

## 2020-07-19 NOTE — Care Management Important Message (Signed)
Important Message  Patient Details IM Letter given to the Patient. Name: Josel Keo MRN: [id number] Date of Birth: April 09, 1951   Medicare Important Message Given:  Yes     Kerin Salen 07/19/2020, 11:44 AM

## 2020-07-19 NOTE — Discharge Summary (Signed)
Physician Discharge Summary  Earl Calderon QIW:[id number] DOB: 1951/08/18 DOA: 07/09/2020  PCP: Pcp, No  Admit date: 07/09/2020 Discharge date: 07/19/2020  Admitted From: Home Disposition:  Home  Recommendations for Outpatient Follow-up:  1. Follow up with PCP in 2-3 weeks 2. Follow up with Cardiology as scheduled  Discharge Condition:Stable CODE STATUS:Full Diet recommendation: Heart healthy, diabetic   Brief/Interim Summary: 69 y.o.malewith medical history significant ofCAD,medical noncompliance,HFrEF 25-30%, IDDM2. Presenting with increasing dizziness, weakness, leg swelling and dyspnea over the last 3 weeks. He states he first noticed his symptoms 3 weeks ago when his legs felt heavy and he could see them swell. He began noticing that he was more short of breath with activity and he even felt a little dizzy at times with vision loss from 2 weeks worse on the left. He tried herbal medicine and rest; but neither improved his symptoms. After 3 weeks, he decided to call his PCP. He was told to go to the ED for further evaluation and management. Pt has since been admitted for management of acute heart failure  Discharge Diagnoses:  Active Problems:   Acute on chronic congestive heart failure (HCC)   Acute on chronic combined systolic (congestive) and diastolic (congestive) heart failure (HCC)   Dyspnea   Acute on chronic combined diastolic and systolic CHF 2D echo completed on 07/10/2020 showed LVEF 30 to 35%, left ventricle global hypokinesis, left atrial severely dilated, right atrial moderately dilated, mild mitral valve regurgitation, grade 2 diastolic dysfunction. Prior images reviewed side by side. The left ventricular diastolic function is significantly worse. (worsened signs of increased filling pressures). Consider cardiac amyloidosis. -Cardiology had been following. Pt underwent RHC on 11/17 with evidence of volume overload.  -Cardiology recommendations to continue  52m lasix BID at time of d/c  Questionablecardiac amyloidosis seen on 2D echo -PYP is not consistent withcardiac amyloidosis.  Coronary artery disease status post PCI with stent placement Continue cardiac medications, ASA 162 mg daily, crestor 40 mg daily Denies chest pain or sob this AM  Acute or subacute infarction in the deep white matter along the lateral margin of the temporal horn and atrium of the right lateral ventricle seen on MRI brain 07/11/2020.  No evidence of hemorrhage or mass-effect. Moderate chronic small vessel ischemic changes elsewhere throughout the brain.  LDL 92, goal less than 70 Hemoglobin A1c 9.2 with a goal of less than 7.0 Increased dose of Crestor to 40 mg daily Continue aspirin 162 mg daily No reported intracardiac thrombus or PFO on 2D echo done on 07/10/2020. Dr. HNevada Cranediscussed with neuro,Dr. CTheda Sers he reviewed his imaging, patient will need to follow up with neurology outpatient. MRA head wo contrast/MRA w/wo contrast completed on 07/13/20 revealed: No evidence of hemodynamically significant stenosis of the carotid or vertebral arteries in the neck. Luminal irregularity of the bilateral ACA and MCA vascular trees, suggestive of atherosclerotic disease, with moderate stenosis at the distal bilateral M1/MCA and bilateral A3/ACA. Mild stenosis in the mid basilar artery.  Painless B/L eye blurriness L>R Recommend to follow up with ophthalmologist after dc  Type 2 diabetes with hyperglycemia Hemoglobin A1c noted to be 9.2 Was continued on insulin sliding scale while in hospital Continue Lantus Glycemic trends stable at this time  Hyperlipidemia LDL is not at goal Crestor dose recently increased to 40 mg daily.  Chronic normocytic anemia Resume home medications No evidence of overt bleeding  Chronic right lower extremity wound secondary to skin picking Continue local wound care as recommended by wound care  specialist.  Constipation, improving Continue bowel regimen as needed  Medication noncompliance Patient stopped taking his current medication for over a year, stated he wanted to remove all the chemicals from his body. Advised medication compliance on d/c  Discharge Instructions   Allergies as of 07/19/2020   No Known Allergies     Medication List    STOP taking these medications   sacubitril-valsartan 24-26 MG Commonly known as: ENTRESTO Replaced by: sacubitril-valsartan 97-103 MG     TAKE these medications   aspirin 81 MG EC tablet Take 2 tablets (162 mg total) by mouth daily. Swallow whole. Start taking on: July 20, 2020   blood glucose meter kit and supplies Kit Dispense based on patient and insurance preference. Use up to four times daily as directed. (FOR ICD-9 250.00, 250.01).   carvedilol 25 MG tablet Commonly known as: COREG Take 1 tablet (25 mg total) by mouth 2 (two) times daily with a meal. What changed:   medication strength  how much to take   dapagliflozin propanediol 10 MG Tabs tablet Commonly known as: FARXIGA Take 1 tablet (10 mg total) by mouth daily. Start taking on: July 20, 2020   furosemide 40 MG tablet Commonly known as: LASIX Take 1 tablet (40 mg total) by mouth 2 (two) times daily. What changed:   medication strength  how much to take  when to take this   insulin aspart 100 UNIT/ML FlexPen Commonly known as: NovoLOG FlexPen Before each meal 3 times a day, 140-199 - 2 units, 200-250 - 4 units, 251-299 - 6 units,  300-349 - 8 units,  350 or above 10 units. Insulin PEN if approved, provide syringes and needles if needed.   insulin detemir 100 unit/ml Soln Commonly known as: LEVEMIR Inject 0.08 mLs (8 Units total) into the skin 2 (two) times daily.   isosorbide-hydrALAZINE 20-37.5 MG tablet Commonly known as: BIDIL Take 1 tablet by mouth 2 (two) times daily.   PEN NEEDLES 29GX1/2" 29G X 12MM Misc Diabetes pen  needles for a month supply   rosuvastatin 40 MG tablet Commonly known as: CRESTOR Take 1 tablet (40 mg total) by mouth daily at 6 PM. What changed:   medication strength  how much to take   sacubitril-valsartan 97-103 MG Commonly known as: ENTRESTO Take 1 tablet by mouth 2 (two) times daily. Replaces: sacubitril-valsartan 24-26 MG   spironolactone 25 MG tablet Commonly known as: ALDACTONE Take 1 tablet (25 mg total) by mouth daily.   ticagrelor 90 MG Tabs tablet Commonly known as: BRILINTA Take 1 tablet (90 mg total) by mouth 2 (two) times daily.       Follow-up Information    [facility]. Call in 1 day(s).   Why: Please call for a post hospital follow up appointment Contact information: [place] [address] [phone]       [name], MD Follow up on 07/24/2020.   Specialty: Cardiology Why: at 11:40 Am Contact information: [address] [place] [address] [phone]        Follow up with your PCP in 2-3 weeks. Schedule an appointment as soon as possible for a visit.              No Known Allergies  Consultations:  Cardiology  Procedures/Studies: CT Head Wo Contrast  Result Date: 07/09/2020 CLINICAL DATA:  Generalized weakness, fatigue for 3 weeks, vision loss for 2 weeks EXAM: CT HEAD WITHOUT CONTRAST  TECHNIQUE: Contiguous axial images were obtained from the base of the skull through the vertex without intravenous contrast. COMPARISON:  None. FINDINGS: Brain: Focal hypodensities in the left basal ganglia and right thalamus consistent with chronic lacunar infarcts. Scattered hypodensities throughout the periventricular white matter are consistent with age-indeterminate small vessel ischemic changes. No evidence of hemorrhage. Lateral ventricles and remaining midline structures are unremarkable. No acute extra-axial fluid collections. No mass effect. Vascular: No  hyperdense vessel or unexpected calcification. Skull: Normal. Negative for fracture or focal lesion. Sinuses/Orbits: No acute finding. Other: None. IMPRESSION: 1. Age-indeterminate small-vessel ischemic changes throughout the periventricular white matter. 2. Chronic lacunar infarcts left basal ganglia and right thalamus. 3. No acute hemorrhage. Electronically Signed   By: Randa Ngo M.D.   On: 07/09/2020 18:09   MR ANGIO HEAD WO CONTRAST  Result Date: 07/13/2020 CLINICAL DATA:  Stroke, follow-up. EXAM: MRA NECK WITHOUT AND WITH CONTRAST MRA HEAD WITHOUT CONTRAST TECHNIQUE: Multiplanar and multiecho pulse sequences of the neck were obtained without and with intravenous contrast. Angiographic images of the neck were obtained using MRA technique without and with intravenous contast.; Angiographic images of the Circle of Willis were obtained using MRA technique without intravenous contrast. CONTRAST:  33m GADAVIST GADOBUTROL 1 MMOL/ML IV SOLN COMPARISON:  None. FINDINGS: MRA NECK FINDINGS Aortic arch: Evaluation limited by motion and contrast bolus timing. Right carotid system: Normal course and caliber without stenosis or evidence of dissection. Left carotid system: Normal caliber without stenosis or evidence of dissection. Increased tortuosity of the upper cervical segment of the right ICA. Vertebral arteries: Left dominant. Vertebral arteries are normal in course and caliber to the vertebrobasilar confluence without stenosis or evidence of dissection. Bilateral pleural effusion. MRA HEAD FINDINGS The visualized portions of the distal cervical and intracranial internal carotid arteries are widely patent with normal flow related enhancement. Luminal irregularity are seen along the bilateral ACA and MCA vascular trees with moderate stenosis at the distal bilateral M1/MCA and bilateral A3/ACA. No intracranial aneurysm within the anterior circulation. The vertebral arteries are widely patent with antegrade flow  noting dominant left vertebral artery. Mild luminal irregularity of the basilar artery with mild stenosis in the mid segment. Mild luminal irregularity along the bilateral posterior cerebral arteries without hemodynamically significant stenosis. No intracranial aneurysm within the posterior circulation. IMPRESSION: 1. Suboptimal study due to motion and contrast bolus timing. 2. No evidence of hemodynamically significant stenosis of the carotid or vertebral arteries in the neck. 3. Luminal irregularity of the bilateral ACA and MCA vascular trees, suggestive of atherosclerotic disease, with moderate stenosis at the distal bilateral M1/MCA and bilateral A3/ACA. 4. Mild stenosis in the mid basilar artery. Electronically Signed   By: KPedro EarlsM.D.   On: 07/13/2020 18:55   MR ANGIO NECK W WO CONTRAST  Result Date: 07/13/2020 CLINICAL DATA:  Stroke, follow-up. EXAM: MRA NECK WITHOUT AND WITH CONTRAST MRA HEAD WITHOUT CONTRAST TECHNIQUE: Multiplanar and multiecho pulse sequences of the neck were obtained without and with intravenous contrast. Angiographic images of the neck were obtained using MRA technique without and with intravenous contast.; Angiographic images of the Circle of Willis were obtained using MRA technique without intravenous contrast. CONTRAST:  726mGADAVIST GADOBUTROL 1 MMOL/ML IV SOLN COMPARISON:  None. FINDINGS: MRA NECK FINDINGS Aortic arch: Evaluation limited by motion and contrast bolus timing. Right carotid system: Normal course and caliber without stenosis or evidence of dissection. Left carotid system: Normal caliber without stenosis or evidence of dissection. Increased tortuosity of the  upper cervical segment of the right ICA. Vertebral arteries: Left dominant. Vertebral arteries are normal in course and caliber to the vertebrobasilar confluence without stenosis or evidence of dissection. Bilateral pleural effusion. MRA HEAD FINDINGS The visualized portions of the distal  cervical and intracranial internal carotid arteries are widely patent with normal flow related enhancement. Luminal irregularity are seen along the bilateral ACA and MCA vascular trees with moderate stenosis at the distal bilateral M1/MCA and bilateral A3/ACA. No intracranial aneurysm within the anterior circulation. The vertebral arteries are widely patent with antegrade flow noting dominant left vertebral artery. Mild luminal irregularity of the basilar artery with mild stenosis in the mid segment. Mild luminal irregularity along the bilateral posterior cerebral arteries without hemodynamically significant stenosis. No intracranial aneurysm within the posterior circulation. IMPRESSION: 1. Suboptimal study due to motion and contrast bolus timing. 2. No evidence of hemodynamically significant stenosis of the carotid or vertebral arteries in the neck. 3. Luminal irregularity of the bilateral ACA and MCA vascular trees, suggestive of atherosclerotic disease, with moderate stenosis at the distal bilateral M1/MCA and bilateral A3/ACA. 4. Mild stenosis in the mid basilar artery. Electronically Signed   By: Pedro Earls M.D.   On: 07/13/2020 18:55   MR BRAIN WO CONTRAST  Result Date: 07/11/2020 CLINICAL DATA:  Confusion.  Dizziness.  Weakness. EXAM: MRI HEAD WITHOUT CONTRAST TECHNIQUE: Multiplanar, multiecho pulse sequences of the brain and surrounding structures were obtained without intravenous contrast. COMPARISON:  Head CT 07/09/2020 FINDINGS: Brain: Diffusion imaging shows acute or subacute infarction in the deep white matter along the lateral margin of the temporal horn and atrium of the right lateral ventricle. No other acute infarction. Elsewhere, there is an old small vessel infarction in the superior right cerebellum. Old small vessel infarction in the right thalamus. Old small vessel infarction of the corpus callosum in mid body. Moderate chronic small-vessel ischemic changes elsewhere  affect the cerebral hemispheric deep and subcortical white matter. No sign of acute hemorrhage, mass lesion, hydrocephalus or extra-axial collection. Vascular: Major vessels at the base of the brain show flow. Skull and upper cervical spine: Negative Sinuses/Orbits: Clear/normal Other: None IMPRESSION: 1. Acute or subacute infarction in the deep white matter along the lateral margin of the temporal horn and atrium of the right lateral ventricle. No evidence of hemorrhage or mass effect. 2. Moderate chronic small-vessel ischemic changes elsewhere throughout the brain as outlined above. Electronically Signed   By: Nelson Chimes M.D.   On: 07/11/2020 10:27   NM CARDIAC AMYLOID TUMOR LOC INFLAM SPECT 1 DAY  Result Date: 07/16/2020 CLINICAL DATA:  HEART FAILURE. CONCERN FOR CARDIAC AMYLOIDOSIS. EXAM: NUCLEAR MEDICINE TUMOR LOCALIZATION. PYP CARDIAC AMYLOIDOSIS SCAN WITH SPECT TECHNIQUE: Following intravenous administration of radiopharmaceutical, anterior planar images of the chest were obtained. Regions of interest were placed on the heart and contralateral chest wall for quantitative assessment. Additional SPECT imaging of the chest was obtained. RADIOPHARMACEUTICALS:  21.2 mCi TECHNETIUM 99 PYROPHOSPHATE FINDINGS: Planar Visual assessment: Anterior planar imaging demonstrates radiotracer uptake within the heart less than uptake within the adjacent ribs (Grade 1). Quantitative assessment : Quantitative assessment of the cardiac uptake compared to the contralateral chest wall is equal to 1.1 (H/CL = 1.1). SPECT assessment: SPECT imaging of the chest demonstrates trace radiotracer accumulation within the LEFT ventricle. IMPRESSION: Visual and quantitative assessment (grade 1, H/CL equal 1.1) are NOT suggestive of transthyretin amyloidosis. Electronically Signed   By: Suzy Bouchard M.D.   On: 07/16/2020 17:02   CARDIAC CATHETERIZATION  Result Date: 07/18/2020 Successful right heart catheterization via the  right antecubital vein. Mildly elevated filling pressures, mild to moderate pulmonary hypertension and mildly reduced cardiac output. RA: 8 mmHg RV: 53/3 mmHg PCW 15 mmHg PA: 50/17 mmHg with a mean of 29 AO sat 97% PA sat 60% Cardiac output: 3.92 with a cardiac index of 2.15. Recommendations: Hemodynamics appear reasonable.  Continue oral furosemide and heart failure medications.  DG Chest Portable 1 View  Result Date: 07/09/2020 CLINICAL DATA:  Shortness of breath EXAM: PORTABLE CHEST 1 VIEW COMPARISON:  None. FINDINGS: The heart size and mediastinal contours are mildly enlarged. There is prominence of the central pulmonary vasculature. Elevation of the right hemidiaphragm is seen. IMPRESSION: Mild cardiomegaly and pulmonary vascular congestion Electronically Signed   By: Prudencio Pair M.D.   On: 07/09/2020 23:43   DG Abd Portable 1V  Result Date: 07/11/2020 CLINICAL DATA:  Diffuse abdominal pain. EXAM: PORTABLE ABDOMEN - 1 VIEW COMPARISON:  None. FINDINGS: The bowel gas pattern is normal. No radio-opaque calculi or other significant radiographic abnormality are seen. IMPRESSION: Negative. Electronically Signed   By: Marijo Conception M.D.   On: 07/11/2020 08:15   ECHOCARDIOGRAM COMPLETE  Result Date: 07/10/2020    ECHOCARDIOGRAM REPORT   Patient Name:   Earl Calderon Date of Exam: 07/10/2020 Medical Rec #:  [id number]         Height:       70.0 in Accession #:    [id number]        Weight:       140.0 lb Date of Birth:  November 13, 1950         BSA:          1.794 m Patient Age:    37 years          BP:           150/73 mmHg Patient Gender: M                 HR:           60 bpm. Exam Location:  Inpatient Procedure: 2D Echo, Cardiac Doppler and Color Doppler Indications:    I50.33 Acute on chronic diastolic (congestive) heart failure  History:        Patient has prior history of Echocardiogram examinations, most                 recent 11/12/2018. CAD; Risk Factors:Hypertension and Diabetes.  Sonographer:     Tiffany Dance Referring Phys: [id number] [place]  1. Left ventricular ejection fraction, by estimation, is 30 to 35%. The left ventricle has moderately decreased function. The left ventricle demonstrates global hypokinesis. There is moderate concentric left ventricular hypertrophy. Left ventricular diastolic parameters are consistent with Grade II diastolic dysfunction (pseudonormalization). Elevated left atrial pressure.  2. Right ventricular systolic function is moderately reduced. The right ventricular size is mildly enlarged. Mildly increased right ventricular wall thickness. There is moderately elevated pulmonary artery systolic pressure. The estimated right ventricular systolic pressure is 22.6 mmHg.  3. Left atrial size was severely dilated.  4. Right atrial size was moderately dilated.  5. The mitral valve is normal in structure. Mild mitral valve regurgitation.  6. Tricuspid valve regurgitation is moderate.  7. The aortic valve is tricuspid. Aortic valve regurgitation is not visualized. No aortic stenosis is present.  8. The inferior vena cava is dilated in size with <50% respiratory variability, suggesting right atrial pressure of 15 mmHg. Comparison(s): Prior images reviewed  side by side. The left ventricular diastolic function is significantly worse. (worsened signs of increased filling pressures). Consider cardiac amyloidosis. FINDINGS  Left Ventricle: Left ventricular ejection fraction, by estimation, is 30 to 35%. The left ventricle has moderately decreased function. The left ventricle demonstrates global hypokinesis. The left ventricular internal cavity size was normal in size. There is moderate concentric left ventricular hypertrophy. Left ventricular diastolic parameters are consistent with Grade II diastolic dysfunction (pseudonormalization). Elevated left atrial pressure. Right Ventricle: The right ventricular size is mildly enlarged. Mildly increased right ventricular wall  thickness. Right ventricular systolic function is moderately reduced. There is moderately elevated pulmonary artery systolic pressure. The tricuspid regurgitant velocity is 3.10 m/s, and with an assumed right atrial pressure of 15 mmHg, the estimated right ventricular systolic pressure is 74.0 mmHg. Left Atrium: Left atrial size was severely dilated. Right Atrium: Right atrial size was moderately dilated. Pericardium: Trivial pericardial effusion is present. Mitral Valve: The mitral valve is normal in structure. Mild mitral annular calcification. Mild mitral valve regurgitation, with centrally-directed jet. Tricuspid Valve: The tricuspid valve is normal in structure. Tricuspid valve regurgitation is moderate. Aortic Valve: The aortic valve is tricuspid. Aortic valve regurgitation is not visualized. No aortic stenosis is present. Pulmonic Valve: The pulmonic valve was normal in structure. Pulmonic valve regurgitation is trivial. Aorta: The aortic root and ascending aorta are structurally normal, with no evidence of dilitation. Venous: The inferior vena cava is dilated in size with less than 50% respiratory variability, suggesting right atrial pressure of 15 mmHg. IAS/Shunts: No atrial level shunt detected by color flow Doppler.  LEFT VENTRICLE PLAX 2D LVIDd:         4.70 cm  Diastology LVIDs:         3.90 cm  LV e' medial:    2.76 cm/s LV PW:         1.50 cm  LV E/e' medial:  33.1 LV IVS:        1.40 cm  LV e' lateral:   5.84 cm/s LVOT diam:     1.90 cm  LV E/e' lateral: 15.6 LV SV:         42 LV SV Index:   23 LVOT Area:     2.84 cm  RIGHT VENTRICLE            IVC RV Basal diam:  3.90 cm    IVC diam: 2.60 cm RV Mid diam:    2.00 cm RV S prime:     6.57 cm/s TAPSE (M-mode): 1.8 cm LEFT ATRIUM              Index       RIGHT ATRIUM           Index LA diam:        5.30 cm  2.95 cm/m  RA Area:     25.60 cm LA Vol (A2C):   118.0 ml 65.78 ml/m RA Volume:   77.10 ml  42.98 ml/m LA Vol (A4C):   74.7 ml  41.65 ml/m LA  Biplane Vol: 96.6 ml  53.85 ml/m  AORTIC VALVE LVOT Vmax:   78.30 cm/s LVOT Vmean:  48.800 cm/s LVOT VTI:    0.148 m  AORTA Ao Root diam: 3.70 cm Ao Asc diam:  3.10 cm MITRAL VALVE               TRICUSPID VALVE MV Area (PHT): 3.27 cm    TR Peak grad:   38.4 mmHg MV Decel Time: 232  msec    TR Vmax:        310.00 cm/s MV E velocity: 91.30 cm/s MV A velocity: 63.60 cm/s  SHUNTS MV E/A ratio:  1.44        Systemic VTI:  0.15 m                            Systemic Diam: 1.90 cm Mihai Croitoru MD Electronically signed by Sanda Klein MD Signature Date/Time: 07/10/2020/3:02:15 PM    Final      Subjective: Eager to go home today  Discharge Exam: Vitals:   07/18/20 2052 07/19/20 0510  BP: 137/70 (!) 154/82  Pulse: (!) 56 (!) 57  Resp: 18   Temp: 97.6 F (36.4 C) 97.9 F (36.6 C)  SpO2: 100% 98%   Vitals:   07/18/20 1620 07/18/20 1730 07/18/20 2052 07/19/20 0510  BP: (!) 182/98 (!) 160/71 137/70 (!) 154/82  Pulse: 60 63 (!) 56 (!) 57  Resp: _0 Temp:  (!) 97.4 F (36.3 C) 97.6 F (36.4 C) 97.9 F (36.6 C)  TempSrc:  Oral Oral Oral  SpO2: 97% 96% 100% 98%  Weight:      Height:        General: Pt is alert, awake, not in acute distress Cardiovascular: RRR, S1/S2 +, no rubs, no gallops Respiratory: CTA bilaterally, no wheezing, no rhonchi Abdominal: Soft, NT, ND, bowel sounds + Extremities: no edema, no cyanosis   The results of significant diagnostics from this hospitalization (including imaging, microbiology, ancillary and laboratory) are listed below for reference.     Microbiology: Recent Results (from the past 240 hour(s))  Respiratory Panel by RT PCR (Flu A&B, Covid) - Nasopharyngeal Swab     Status: None   Collection Time: 07/09/20  9:51 PM   Specimen: Nasopharyngeal Swab  Result Value Ref Range Status   SARS Coronavirus 2 by RT PCR NEGATIVE NEGATIVE Final    Comment: (NOTE) SARS-CoV-2 target nucleic acids are NOT DETECTED.  The SARS-CoV-2 RNA is generally  detectable in upper respiratoy specimens during the acute phase of infection. The lowest concentration of SARS-CoV-2 viral copies this assay can detect is 131 copies/mL. A negative result does not preclude SARS-Cov-2 infection and should not be used as the sole basis for treatment or other patient management decisions. A negative result may occur with  improper specimen collection/handling, submission of specimen other than nasopharyngeal swab, presence of viral mutation(s) within the areas targeted by this assay, and inadequate number of viral copies (<131 copies/mL). A negative result must be combined with clinical observations, patient history, and epidemiological information. The expected result is Negative.  Fact Sheet for Patients:  PinkCheek.be  Fact Sheet for Healthcare Providers:  GravelBags.it  This test is no t yet approved or cleared by the [place] FDA and  has been authorized for detection and/or diagnosis of SARS-CoV-2 by FDA under an Emergency Use Authorization (EUA). This EUA will remain  in effect (meaning this test can be used) for the duration of the COVID-19 declaration under Section 564(b)(1) of the Act, 21 U.S.C. section 360bbb-3(b)(1), unless the authorization is terminated or revoked sooner.     Influenza A by PCR NEGATIVE NEGATIVE Final   Influenza B by PCR NEGATIVE NEGATIVE Final    Comment: (NOTE) The Xpert Xpress SARS-CoV-2/FLU/RSV assay is intended as an aid in  the diagnosis of influenza from Nasopharyngeal swab specimens and  should not be used as  a sole basis for treatment. Nasal washings and  aspirates are unacceptable for Xpert Xpress SARS-CoV-2/FLU/RSV  testing.  Fact Sheet for Patients: PinkCheek.be  Fact Sheet for Healthcare Providers: GravelBags.it  This test is not yet approved or cleared by the [place] FDA and   has been authorized for detection and/or diagnosis of SARS-CoV-2 by  FDA under an Emergency Use Authorization (EUA). This EUA will remain  in effect (meaning this test can be used) for the duration of the  Covid-19 declaration under Section 564(b)(1) of the Act, 21  U.S.C. section 360bbb-3(b)(1), unless the authorization is  terminated or revoked. Performed at [facility], [address] [place], [place] [address]      Labs: BNP (last 3 results) Recent Labs    07/09/20 1752 07/17/20 0548  BNP 985.7* 588.5*   Basic Metabolic Panel: Recent Labs  Lab 07/15/20 0600 07/15/20 0600 07/16/20 0501 07/17/20 0548 07/18/20 0535 07/18/20 1605 07/19/20 1100  NA 138   < > 138 137 141 144   144 142  K 4.4   < > 3.8 3.7 3.9 3.9   4.0 4.0  CL 102  --  101 103 106  --  105  CO2 30  --  _0 --  29  GLUCOSE 267*  --  259* 182* 113*  --  133*  BUN 27*  --  37* 45* 49*  --  49*  CREATININE 1.04  --  1.08 0.99 1.20  --  0.99  CALCIUM 8.4*  --  8.6* 8.3* 8.4*  --  8.7*  MG  --   --   --  1.6* 1.6*  --  2.1   < > = values in this interval not displayed.   Liver Function Tests: No results for input(s): AST, ALT, ALKPHOS, BILITOT, PROT, ALBUMIN in the last 168 hours. No results for input(s): LIPASE, AMYLASE in the last 168 hours. No results for input(s): AMMONIA in the last 168 hours. CBC: Recent Labs  Lab 07/18/20 1605  HGB 11.6*   11.6*  HCT 34.0*   34.0*   Cardiac Enzymes: No results for input(s): CKTOTAL, CKMB, CKMBINDEX, TROPONINI in the last 168 hours. BNP: Invalid input(s): POCBNP CBG: Recent Labs  Lab 07/18/20 1625 07/18/20 1718 07/18/20 2051 07/19/20 0805 07/19/20 1115  GLUCAP 192* 204* 179* 95 150*   D-Dimer No results for input(s): DDIMER in the last 72 hours. Hgb A1c No results for input(s): HGBA1C in the last 72 hours. Lipid Profile No results for input(s): CHOL, HDL, LDLCALC, TRIG, CHOLHDL, LDLDIRECT in the last 72 hours. Thyroid function  studies No results for input(s): TSH, T4TOTAL, T3FREE, THYROIDAB in the last 72 hours.  Invalid input(s): FREET3 Anemia work up No results for input(s): VITAMINB12, FOLATE, FERRITIN, TIBC, IRON, RETICCTPCT in the last 72 hours. Urinalysis    Component Value Date/Time   COLORURINE YELLOW 07/09/2020 2151   APPEARANCEUR CLEAR 07/09/2020 2151   LABSPEC 1.020 07/09/2020 2151   PHURINE 6.5 07/09/2020 2151   GLUCOSEU >=500 (A) 07/09/2020 2151   HGBUR MODERATE (A) 07/09/2020 2151   BILIRUBINUR NEGATIVE 07/09/2020 2151   [place] NEGATIVE 07/09/2020 2151   PROTEINUR 100 (A) 07/09/2020 2151   NITRITE NEGATIVE 07/09/2020 2151   LEUKOCYTESUR NEGATIVE 07/09/2020 2151   Sepsis Labs Invalid input(s): PROCALCITONIN,  WBC,  LACTICIDVEN Microbiology Recent Results (from the past 240 hour(s))  Respiratory Panel by RT PCR (Flu A&B, Covid) - Nasopharyngeal Swab     Status: None  Collection Time: 07/09/20  9:51 PM   Specimen: Nasopharyngeal Swab  Result Value Ref Range Status   SARS Coronavirus 2 by RT PCR NEGATIVE NEGATIVE Final    Comment: (NOTE) SARS-CoV-2 target nucleic acids are NOT DETECTED.  The SARS-CoV-2 RNA is generally detectable in upper respiratoy specimens during the acute phase of infection. The lowest concentration of SARS-CoV-2 viral copies this assay can detect is 131 copies/mL. A negative result does not preclude SARS-Cov-2 infection and should not be used as the sole basis for treatment or other patient management decisions. A negative result may occur with  improper specimen collection/handling, submission of specimen other than nasopharyngeal swab, presence of viral mutation(s) within the areas targeted by this assay, and inadequate number of viral copies (<131 copies/mL). A negative result must be combined with clinical observations, patient history, and epidemiological information. The expected result is Negative.  Fact Sheet for Patients:   PinkCheek.be  Fact Sheet for Healthcare Providers:  GravelBags.it  This test is no t yet approved or cleared by the [place] FDA and  has been authorized for detection and/or diagnosis of SARS-CoV-2 by FDA under an Emergency Use Authorization (EUA). This EUA will remain  in effect (meaning this test can be used) for the duration of the COVID-19 declaration under Section 564(b)(1) of the Act, 21 U.S.C. section 360bbb-3(b)(1), unless the authorization is terminated or revoked sooner.     Influenza A by PCR NEGATIVE NEGATIVE Final   Influenza B by PCR NEGATIVE NEGATIVE Final    Comment: (NOTE) The Xpert Xpress SARS-CoV-2/FLU/RSV assay is intended as an aid in  the diagnosis of influenza from Nasopharyngeal swab specimens and  should not be used as a sole basis for treatment. Nasal washings and  aspirates are unacceptable for Xpert Xpress SARS-CoV-2/FLU/RSV  testing.  Fact Sheet for Patients: PinkCheek.be  Fact Sheet for Healthcare Providers: GravelBags.it  This test is not yet approved or cleared by the [place] FDA and  has been authorized for detection and/or diagnosis of SARS-CoV-2 by  FDA under an Emergency Use Authorization (EUA). This EUA will remain  in effect (meaning this test can be used) for the duration of the  Covid-19 declaration under Section 564(b)(1) of the Act, 21  U.S.C. section 360bbb-3(b)(1), unless the authorization is  terminated or revoked. Performed at [facility], [address] [place], [place] [address]    Time spent: 30 min  SIGNED:   Marylu Lund, MD  [facility] 07/19/2020, 12:05 PM  If 7PM-7AM, please contact night-coverage

## 2020-07-20 ENCOUNTER — Telehealth: Payer: Self-pay | Admitting: Cardiovascular Disease

## 2020-07-20 NOTE — Telephone Encounter (Signed)
Earl Calderon is calling stating he does not have transportation to or from his scheduled appointment on 07/24/20, but he still wishes to come if our office is able to assist him with a way there and back. Please advise.

## 2020-07-20 NOTE — Telephone Encounter (Signed)
Routing to Care Guide, Avelino Leeds.

## 2020-07-23 ENCOUNTER — Telehealth: Payer: Self-pay

## 2020-07-23 DIAGNOSIS — Z0000 Encounter for general adult medical examination without abnormal findings: Secondary | ICD-10-CM

## 2020-07-23 NOTE — Telephone Encounter (Signed)
Remi is calling to follow up with Amy due to not hearing anything back yet and his appointment being tomorrow morning. He states if he is unable to answer when calling back to please leave a detailed VM. Please advise.

## 2020-07-23 NOTE — Telephone Encounter (Signed)
Called patient to discuss need for transportation to [facility] appointment on 11/23 at 11:40a. Care Guide will place a request for transportation to [facility] on behalf of the patient. Left patient a message to call Care Guide.

## 2020-07-23 NOTE — Telephone Encounter (Signed)
Called patient back regarding transportation arrangement for 07/24/20 to see Dr. Oval Linsey. Patient was provided the [place] # to finalize his ride. Patient will be contacted again by Care Guide to ensure that ride has been secured.

## 2020-07-23 NOTE — Telephone Encounter (Signed)
   Earl Calderon DOB: 1951-03-27 MRN: [id number]   RIDER WAIVER AND RELEASE OF LIABILITY  For purposes of improving physical access to our facilities, [facility] is pleased to partner with third parties to provide [facility] patients or other authorized individuals the option of convenient, on-demand ground transportation services (the [place]") through use of the technology service that enables users to request on-demand ground transportation from independent third-party providers.  By opting to use and accept these [employer], I, the undersigned, hereby agree on behalf of myself, and on behalf of any minor child using the [employer] for whom I am the parent or legal guardian, as follows:  1. Government social research officer provided to me are provided by independent third-party transportation providers who are not [employer] or employees and who are unaffiliated with [employer]. 2. [facility] is neither a transportation carrier nor a common or public carrier. 3. [facility] has no control over the quality or safety of the transportation that occurs as a result of the [employer]. 4. [facility] cannot guarantee that any third-party transportation provider will complete any arranged transportation service. 5. [facility] makes no representation, warranty, or guarantee regarding the reliability, timeliness, quality, safety, suitability, or availability of any of the Transport Services or that they will be error free. 6. I fully understand that traveling by vehicle involves risks and dangers of serious bodily injury, including permanent disability, paralysis, and death. I agree, on behalf of myself and on behalf of any minor child using the Transport Services for whom I am the parent or legal guardian, that the entire risk arising out of my use of the [employer] remains solely with me, to the maximum extent permitted under applicable law. 7. The [employer] are provided "as is" and "as available." [facility] disclaims all representations and warranties, express, implied or statutory, not expressly set out in these terms, including the implied warranties of merchantability and fitness for a particular purpose. 8. I hereby waive and release [facility], its agents, employees, officers, directors, representatives, insurers, attorneys, assigns, successors, subsidiaries, and affiliates from any and all past, present, or future claims, demands, liabilities, actions, causes of action, or suits of any kind directly or indirectly arising from acceptance and use of the [employer]. 9. I further waive and release [facility] and its affiliates from all present and future liability and responsibility for any injury or death to persons or damages to property caused by or related to the use of the [employer]. 10. I have read this Waiver and Release of Liability, and I understand the terms used in it and their legal significance. This Waiver is freely and voluntarily given with the understanding that my right (as well as the right of any minor child for whom I am the parent or legal guardian using the [employer]) to legal recourse against  in connection with the [employer] is knowingly surrendered in return for use of these services.   I attest that I read the consent document to Earl Calderon, gave Earl Calderon the opportunity to ask questions and answered the questions asked (if any). I affirm that Earl Calderon then provided consent for he's participation in this program.     Earl Calderon

## 2020-07-23 NOTE — Telephone Encounter (Signed)
I called the patient and left a message. Will continue to attempt to contact him.

## 2020-07-24 ENCOUNTER — Encounter: Payer: Self-pay | Admitting: Cardiovascular Disease

## 2020-07-24 ENCOUNTER — Other Ambulatory Visit: Payer: Self-pay

## 2020-07-24 ENCOUNTER — Telehealth: Payer: Self-pay | Admitting: Cardiovascular Disease

## 2020-07-24 ENCOUNTER — Telehealth: Payer: Self-pay | Admitting: Licensed Clinical Social Worker

## 2020-07-24 ENCOUNTER — Ambulatory Visit (INDEPENDENT_AMBULATORY_CARE_PROVIDER_SITE_OTHER): Payer: Medicare Other | Admitting: Cardiovascular Disease

## 2020-07-24 VITALS — BP 126/58 | HR 56 | Ht 70.0 in | Wt 143.8 lb

## 2020-07-24 DIAGNOSIS — I472 Ventricular tachycardia: Secondary | ICD-10-CM

## 2020-07-24 DIAGNOSIS — I447 Left bundle-branch block, unspecified: Secondary | ICD-10-CM | POA: Diagnosis not present

## 2020-07-24 DIAGNOSIS — I10 Essential (primary) hypertension: Secondary | ICD-10-CM

## 2020-07-24 DIAGNOSIS — I4729 Other ventricular tachycardia: Secondary | ICD-10-CM

## 2020-07-24 DIAGNOSIS — I5042 Chronic combined systolic (congestive) and diastolic (congestive) heart failure: Secondary | ICD-10-CM | POA: Diagnosis not present

## 2020-07-24 DIAGNOSIS — Z955 Presence of coronary angioplasty implant and graft: Secondary | ICD-10-CM

## 2020-07-24 DIAGNOSIS — N1831 Chronic kidney disease, stage 3a: Secondary | ICD-10-CM

## 2020-07-24 DIAGNOSIS — Z5181 Encounter for therapeutic drug level monitoring: Secondary | ICD-10-CM | POA: Diagnosis not present

## 2020-07-24 DIAGNOSIS — E1165 Type 2 diabetes mellitus with hyperglycemia: Secondary | ICD-10-CM

## 2020-07-24 DIAGNOSIS — I5041 Acute combined systolic (congestive) and diastolic (congestive) heart failure: Secondary | ICD-10-CM

## 2020-07-24 MED ORDER — NOVOLOG FLEXPEN 100 UNIT/ML ~~LOC~~ SOPN
PEN_INJECTOR | SUBCUTANEOUS | 2 refills | Status: DC
Start: 2020-07-24 — End: 2020-09-12

## 2020-07-24 MED ORDER — INSULIN DETEMIR 100 UNIT/ML FLEXPEN: 8.0000 [IU] | Freq: Two times a day (BID) | SUBCUTANEOUS | 2 refills | Status: DC

## 2020-07-24 MED ORDER — INSULIN DETEMIR 100 UNIT/ML FLEXPEN
8.0000 [IU] | Freq: Two times a day (BID) | SUBCUTANEOUS | 2 refills | Status: DC
Start: 2020-07-24 — End: 2020-07-24

## 2020-07-24 NOTE — Telephone Encounter (Signed)
Was going to get Levemir converted to Lantus 16 Units at bedtime Called Earl Calderon to advise, she checked again and was in stock. She will order and have for tomorrow  No change necessary at this time

## 2020-07-24 NOTE — Progress Notes (Signed)
Cardiology Office Note   Date:  08/24/2020   ID:  Earl Calderon, DOB 27-Sep-1950, MRN [id number]  PCP:  Pcp, No  Cardiologist:   [name], MD   No chief complaint on file.    History of Present Illness: Earl Calderon is a 17M with chronic systolic and diastolic heart failure, prior stroke, diabetes, hypertension, hyperlipidemia, CAD status post PCI and nonadherence admitted with acute on chronic systolic and diastolic heart failure in the setting of not taking his medications.  He was admitted with volume overload and dyspnea.  He wanted to "remove all chemicals from his body" and decided to stop his medications.  Echo revealed LVEF 30-35% with global hypokinesis and moderate LVH.  BP was poorly controlled.  There was concern for cardiac amyloidosis.  PYP scan during that hospitalization was not suggestive of ATTR amyloidosis.  He was started on carvedilol, Entresto, spironolactone, BiDil, and for [place].  Prior to discharge he had a right heart cath with radial pressure of 8, RV 53/3, pulmonary capillary wedge 15.  Mean PA pressure was 29 mmHg.  After discharge he was unable to get spironolactone or BiDil from the pharmacy.  Earl Calderon was first seen by cardiology 10/2018 at which time he was hospitalized and found to have a newly reduced LVEF to 25 to 30%.  EKG revealed left bundle branch block.  He underwent right and left heart cath 10/2018 and was found to have a 90% mid LAD lesion that was successfully stented with drug-eluting stent.  He did not follow-up after that time.  Since he has been discharged he has been frustrated.  He isn't getting along well with his wife.  He is frustrated that she "makes him feel like a child."  His breathing has been stable.  He notes that he gets very tired and weak.  He has not been checking blood pressure at home because he does not have a machine.  He also does not weigh himself.  He is trying to limit his sodium intake.  He is frustrated that  he has not been able to get his insulin from the pharmacy.  He also complains of constipation and frequent urination.   Past Medical History:  Diagnosis Date  . Coronary artery disease   . Diabetes mellitus without complication ([place])   . Hernia, inguinal   . Hypertension   . Uncontrolled type 2 diabetes mellitus ([place]) 11/11/2018    Past Surgical History:  Procedure Laterality Date  . CORONARY STENT INTERVENTION N/A 11/15/2018   Procedure: CORONARY STENT INTERVENTION;  Surgeon: [name], MD;  Location: [facility];  Service: Cardiovascular;  Laterality: N/A;  . HEMORROIDECTOMY    . RIGHT HEART CATH N/A 07/18/2020   Procedure: RIGHT HEART CATH;  Surgeon: [name], MD;  Location: [facility];  Service: Cardiovascular;  Laterality: N/A;  . RIGHT/LEFT HEART CATH AND CORONARY ANGIOGRAPHY N/A 11/15/2018   Procedure: RIGHT/LEFT HEART CATH AND CORONARY ANGIOGRAPHY;  Surgeon: [name], MD;  Location: [facility];  Service: Cardiovascular;  Laterality: N/A;     Current Outpatient Medications  Medication Sig Dispense Refill  . blood glucose meter kit and supplies KIT Dispense based on patient and insurance preference. Use up to four times daily as directed. (FOR ICD-9 250.00, 250.01). 1 each 0  . carvedilol (COREG) 25 MG tablet Take 1 tablet (25 mg total) by mouth 2 (two) times daily with a meal. 60 tablet 0  .  furosemide (LASIX) 40 MG tablet Take 40 mg by mouth daily.    . Insulin Pen Needle (PEN NEEDLES 29GX1/2") 29G X 12MM MISC Diabetes pen needles for a month supply 100 each 0  . rosuvastatin (CRESTOR) 40 MG tablet Take 1 tablet (40 mg total) by mouth daily at 6 PM. 30 tablet 0  . insulin aspart (NOVOLOG FLEXPEN) 100 UNIT/ML FlexPen Before each meal 3 times a day, 140-199 - 2 units, 200-250 - 4 units, 251-299 - 6 units,  300-349 - 8 units,  350 or above 10 units. Insulin PEN if approved, provide syringes and needles if needed. 15 mL 2   No  current facility-administered medications for this visit.    Allergies:   Patient has no known allergies.    Social History:  The patient  reports that he has never smoked. He has never used smokeless tobacco. He reports that he does not drink alcohol and does not use drugs.   Family History:  The patient's family history includes Arrhythmia in his mother; Diabetes Mellitus II in his mother and another family member; Heart disease in his mother; Hypertension in his brother, mother, sister, and another family member.    ROS:  Please see the history of present illness.   Otherwise, review of systems are positive for none.   All other systems are reviewed and negative.    PHYSICAL EXAM: VS:  BP (!) 126/58   Pulse (!) 56   Ht 5' 10"  (1.778 m)   Wt 143 lb 12.8 oz (65.2 kg)   BMI 20.63 kg/m  , BMI Body mass index is 20.63 kg/m. GENERAL:  Well appearing HEENT:  Pupils equal round and reactive, fundi not visualized, oral mucosa unremarkable NECK:  No jugular venous distention, waveform within normal limits, carotid upstroke brisk and symmetric, no bruits, no thyromegaly LYMPHATICS:  No cervical adenopathy LUNGS:  Clear to auscultation bilaterally HEART:  RRR.  PMI not displaced or sustained,S1 and S2 within normal limits, no S3, no S4, no clicks, no rubs, no murmurs ABD:  Flat, positive bowel sounds normal in frequency in pitch, no bruits, no rebound, no guarding, no midline pulsatile mass, no hepatomegaly, no splenomegaly EXT:  2 plus pulses throughout, no edema, no cyanosis no clubbing SKIN:  No rashes no nodules NEURO:  Cranial nerves II through XII grossly intact, motor grossly intact throughout PSYCH:  Cognitively intact, oriented to person place and time    EKG:  EKG is not ordered today.  Echo 07/10/20: IMPRESSIONS    1. Left ventricular ejection fraction, by estimation, is 30 to 35%. The  left ventricle has moderately decreased function. The left ventricle  demonstrates  global hypokinesis. There is moderate concentric left  ventricular hypertrophy. Left ventricular  diastolic parameters are consistent with Grade II diastolic dysfunction  (pseudonormalization). Elevated left atrial pressure.  2. Right ventricular systolic function is moderately reduced. The right  ventricular size is mildly enlarged. Mildly increased right ventricular  wall thickness. There is moderately elevated pulmonary artery systolic  pressure. The estimated right  ventricular systolic pressure is 10.9 mmHg.  3. Left atrial size was severely dilated.  4. Right atrial size was moderately dilated.  5. The mitral valve is normal in structure. Mild mitral valve  regurgitation.  6. Tricuspid valve regurgitation is moderate.  7. The aortic valve is tricuspid. Aortic valve regurgitation is not  visualized. No aortic stenosis is present.  8. The inferior vena cava is dilated in size with <50% respiratory  variability,  suggesting right atrial pressure of 15 mmHg.   [place] 07/18/20: Mildly elevated filling pressures, mild to moderate pulmonary hypertension and mildly reduced cardiac output.  RA: 8 mmHg RV: 53/3 mmHg PCW 15 mmHg PA: 50/17 mmHg with a mean of 29  AO sat 97% PA sat 60% Cardiac output: 3.92 with a cardiac index of 2.15.  LHC 11/15/18:   Prox LAD to Mid LAD lesion is 40% stenosed.  Mid Cx to Dist Cx lesion is 25% stenosed.  Ost 2nd Diag to 2nd Diag lesion is 50% stenosed.  Ost 3rd Mrg lesion is 25% stenosed.  Mid LAD lesion is 90% stenosed.  A drug-eluting stent was successfully placed using a STENT SYNERGY DES 2.5X16.  Post intervention, there is a 0% residual stenosis.  LV end diastolic pressure is normal.  There is no aortic valve stenosis.  Ao 92%, PA 66%, mean PA 17 mm Hg, mean PCWP 3 mm Hg, CO 5.1; CI 2.8- normal right heart pressures  Tortuous right subclavian. 3DRC was used for RCA.   Continue aggressive secondary prevention in addition to  dual antiplatelet therapy.  Adequate diuresis based on right heart pressures.  Continue medical therapy for heart failure.    Recent Labs: 07/11/2020: ALT 11; Platelets 180 07/17/2020: B Natriuretic Peptide 241.4 07/18/2020: Hemoglobin 11.6; Hemoglobin 11.6 07/19/2020: Magnesium 2.1 07/24/2020: BUN 32; Creatinine, Ser 1.29; Potassium 4.1; Sodium 141    Lipid Panel    Component Value Date/Time   CHOL 146 07/12/2020 0510   TRIG 66 07/12/2020 0510   HDL 41 07/12/2020 0510   CHOLHDL 3.6 07/12/2020 0510   VLDL 13 07/12/2020 0510   LDLCALC 92 07/12/2020 0510      Wt Readings from Last 3 Encounters:  07/24/20 143 lb 12.8 oz (65.2 kg)  07/18/20 146 lb (66.2 kg)  03/14/19 140 lb (63.5 kg)      ASSESSMENT AND PLAN:  # Chronic systolic and diastolic heart failure: # Essential hypertension: BP well-controlled today on carvedilol, Entresto, and Lasix. He never picked up the spironolactone and Bidil.  Despite that his blood pressure is on the low side and he is complaining of fatigue. We will not limit this time. He has difficulty affording things so he was provided both a blood pressure cuff and scale today. Of asked him to check his blood pressures twice daily and write them down. I also asked him to weigh himself his morning gets up. He will call our office if he gains more than 2 to 3 pounds in a day or 9 pounds over the course of the week. He is too volume depleted, especially after adding [place]. We will reduce Lasix to once daily. Check a basic metabolic panel today. He now understands importance of taking all his medications as prescribed. We did also discuss cardiac rehab he thinks he will be able to increase his exercise on his own at home.  # CKD III: Check a BMP as above. Renal function was stable in the hospital with diuresis.  # CAD:  S/p LAD DES 10/2018. Currently symptomatic. Continue aspirin and carvedilol. Continue rosuvastatin.  # NSVT: Continue carvedilol.  # DM:   Continue Farxiga.  It was started during admission. For some reason his insulin was not called into the pharmacy after leaving the hospital. We wiill call in today and he will pick it up.  Future refills from PCP.  Current medicines are reviewed at length with the patient today.  The patient does not have concerns regarding medicines.  The following changes have been made:  Spironolactone and Bidil removed from list.  Reduced lasix to daily.   Labs/ tests ordered today include:   Orders Placed This Encounter  Procedures  . Basic metabolic panel     Disposition:   FU with APP in 6 weeks.      Signed, Dmoni Fortson C. Oval Linsey, MD, [facility]  08/24/2020 5:24 PM    [place]

## 2020-07-24 NOTE — Telephone Encounter (Signed)
Pt c/o medication issue:  1. Name of Medication: insulin detemir (LEVEMIR) 100 unit/ml SOLN  2. How are you currently taking this medication (dosage and times per day)? N/A  3. Are you having a reaction (difficulty breathing--STAT)? No   4. What is your medication issue? Jamie with [facility] called to inform Dr. Oval Linsey that they received the Rx for the medication. However, they do not have any in stock. She states she called 3 other pharmacies to check for the medication and it was unavailable at each location. Roselyn Reef would like to know if there is another medication the patient may be eligible for such as Lantus. Please advise.  Phone#: [phone]

## 2020-07-24 NOTE — Telephone Encounter (Signed)
CSW contacted by Rip Harbour, LPN, regarding pt need for BP machine. Request made to [facility] leadership and it will be ordered for delivery to pt home.  Westley Hummer, MSW, [place] [facility]

## 2020-07-24 NOTE — Patient Instructions (Addendum)
Medication Instructions:  TAKE YOUR FUROSEMIDE ONCE A DAY   *If you need a refill on your cardiac medications before your next appointment, please call your pharmacy*  Lab Work: BMET TODAY   If you have labs (blood work) drawn today and your tests are completely normal, you will receive your results only by: Marland Kitchen MyChart Message (if you have MyChart) OR . A paper copy in the mail If you have any lab test that is abnormal or we need to change your treatment, we will call you to review the results.  Testing/Procedures: NONE  Follow-Up: At [facility], you and your health needs are our priority.  As part of our continuing mission to provide you with exceptional heart care, we have created designated Provider Care Teams.  These Care Teams include your primary Cardiologist (physician) and Advanced Practice Providers (APPs -  Physician Assistants and Nurse Practitioners) who all work together to provide you with the care you need, when you need it.  We recommend signing up for the patient portal called "MyChart".  Sign up information is provided on this After Visit Summary.  MyChart is used to connect with patients for Virtual Visits (Telemedicine).  Patients are able to view lab/test results, encounter notes, upcoming appointments, etc.  Non-urgent messages can be sent to your provider as well.   To learn more about what you can do with MyChart, go to [email or web address].    Your next appointment:   4-6 week(s)  The format for your next appointment:   In Person  Provider:   You will see one of the following Advanced Practice Providers on your designated Care Team:    Kerin Ransom, PA-C  [place], [place]  Coletta Memos,   Other Instructions  MONITOR AND LOG YOUR BLOOD PRESSURE TWICE A DAY, BRING READINGS TO YOUR FOLLOW UP  WEIGHT YOURSELF EVERY MORNING FIRST THING AFTER YOU URINATE. IF YOU GAIN 2-3 POUNDS IN 24 HOURS OR 5 POUNDS IN 7 DAYS CALL THE OFFICE AT [phone]

## 2020-07-24 NOTE — Telephone Encounter (Signed)
Earl Calderon calling back requesting a call back before closing today.

## 2020-07-25 ENCOUNTER — Telehealth: Payer: Self-pay | Admitting: Licensed Clinical Social Worker

## 2020-07-25 LAB — BASIC METABOLIC PANEL
BUN/Creatinine Ratio: 25 — ABNORMAL HIGH (ref 10–24)
BUN: 32 mg/dL — ABNORMAL HIGH (ref 8–27)
CO2: 30 mmol/L — ABNORMAL HIGH (ref 20–29)
Calcium: 9 mg/dL (ref 8.6–10.2)
Chloride: 98 mmol/L (ref 96–106)
Creatinine, Ser: 1.29 mg/dL — ABNORMAL HIGH (ref 0.76–1.27)
GFR calc Af Amer: 66 mL/min/{1.73_m2} (ref >59)
GFR calc non Af Amer: 57 mL/min/{1.73_m2} — ABNORMAL LOW (ref >59)
Glucose: 367 mg/dL — ABNORMAL HIGH (ref 65–99)
Potassium: 4.1 mmol/L (ref 3.5–5.2)
Sodium: 141 mmol/L (ref 134–144)

## 2020-07-25 NOTE — Telephone Encounter (Signed)
CSW referred to assist patient with obtaining a BP cuff. CSW contacted patient to inform cuff will be delivered to home. Patient grateful for support and assistance. CSW available as needed. Jackie Arrion Broaddus, LCSW, CCSW-MCS [phone]  

## 2020-08-10 ENCOUNTER — Telehealth: Payer: Self-pay

## 2020-08-10 DIAGNOSIS — Z0000 Encounter for general adult medical examination without abnormal findings: Secondary | ICD-10-CM

## 2020-08-10 NOTE — Telephone Encounter (Signed)
Returned patients called. Patient inquired about the number to [facility]. Care Guide confirmed that the number the patient had was correct [phone]). Patient will be calling to arrange transportation for his appointment on 08/21/20.

## 2020-08-21 ENCOUNTER — Ambulatory Visit: Payer: Medicare Other | Admitting: Cardiology

## 2020-08-24 ENCOUNTER — Encounter: Payer: Self-pay | Admitting: Cardiovascular Disease

## 2020-08-24 DIAGNOSIS — I4729 Other ventricular tachycardia: Secondary | ICD-10-CM

## 2020-08-24 DIAGNOSIS — I472 Ventricular tachycardia: Secondary | ICD-10-CM

## 2020-08-24 DIAGNOSIS — N183 Chronic kidney disease, stage 3 unspecified: Secondary | ICD-10-CM | POA: Insufficient documentation

## 2020-08-24 HISTORY — DX: Ventricular tachycardia: I47.2

## 2020-08-24 HISTORY — DX: Chronic kidney disease, stage 3 unspecified: N18.30

## 2020-08-24 HISTORY — DX: Other ventricular tachycardia: I47.29

## 2020-09-03 ENCOUNTER — Telehealth: Payer: Self-pay | Admitting: Cardiovascular Disease

## 2020-09-03 ENCOUNTER — Other Ambulatory Visit: Payer: Self-pay

## 2020-09-03 MED ORDER — CARVEDILOL 25 MG PO TABS: 25.0000 mg | ORAL_TABLET | Freq: Two times a day (BID) | ORAL | 3 refills | Status: DC

## 2020-09-03 MED ORDER — ROSUVASTATIN CALCIUM 40 MG PO TABS: 40.0000 mg | ORAL_TABLET | Freq: Every day | ORAL | 3 refills | Status: DC

## 2020-09-03 MED ORDER — ASPIRIN EC 81 MG PO TBEC: 81.0000 mg | DELAYED_RELEASE_TABLET | Freq: Every day | ORAL | 3 refills | Status: DC

## 2020-09-03 MED ORDER — ENTRESTO 97-103 MG PO TABS: 1.0000 | ORAL_TABLET | Freq: Two times a day (BID) | ORAL | 3 refills | Status: DC

## 2020-09-03 MED ORDER — FUROSEMIDE 40 MG PO TABS
40.0000 mg | ORAL_TABLET | Freq: Every day | ORAL | 3 refills | Status: DC
Start: 2020-09-03 — End: 2021-10-10

## 2020-09-03 NOTE — Telephone Encounter (Signed)
Reviewed all medications that pt needed refilled and sent them to [facility] in [place]. Pt verbalizes understanding and is thankful for this call.

## 2020-09-03 NOTE — Telephone Encounter (Signed)
*  STAT* If patient is at the pharmacy, call can be transferred to refill team.   1. Which medications need to be refilled? (please list name of each medication and dose if known)   Aspirin 81 mg rosuvastatin (CRESTOR) 40 MG tablet furosemide (LASIX) 40 MG tablet farxiga 10 mg entresto 95-103 mg carvedilol (COREG) 25 MG tablet insulin aspart (NOVOLOG FLEXPEN) 100 UNIT/ML FlexPen   2. Which pharmacy/location (including street and city if local pharmacy) is medication to be sent to? [facility] - [place], [place] - [address]  3. Do they need a 30 day or 90 day supply? 90 day  Patient is out of all medications. Patient states he does not have a PCP at the moment and would like Dr. Duke Salvia to refill the medications.

## 2020-09-04 NOTE — Telephone Encounter (Signed)
Earl Calderon is calling wanting to know why his farxiga 10 MG's was not sent to the pharmacy with the rest of these prescriptions. He states Dr. Duke Salvia prescribed it for him while in the hospital. Please advise.

## 2020-09-05 NOTE — Telephone Encounter (Signed)
Will forward to Dr [place] for review  

## 2020-09-07 MED ORDER — DAPAGLIFLOZIN PROPANEDIOL 10 MG PO TABS: 10.0000 mg | ORAL_TABLET | Freq: Every day | ORAL | 1 refills | Status: DC

## 2020-09-07 NOTE — Telephone Encounter (Signed)
Yes please send to pharmacy 

## 2020-09-07 NOTE — Telephone Encounter (Addendum)
Per patient he does not have PCP, trying to get one in [place]  He does not drive secondary to his vision Discussed with Dr Oval Linsey and she will ok for insulin but must find PCP soon  Need to discuss insulin type and directions  Left message to call back

## 2020-09-07 NOTE — Telephone Encounter (Signed)
Left message to call back  Rx for Farxiga sent to [facility], Insulin he will need to get from PCP per Dr Oval Linsey

## 2020-09-07 NOTE — Addendum Note (Signed)
Addended by: Alvina Filbert B on: 09/07/2020 03:00 PM   Modules accepted: Orders

## 2020-09-12 ENCOUNTER — Telehealth: Payer: Self-pay | Admitting: Cardiovascular Disease

## 2020-09-12 MED ORDER — NOVOLOG FLEXPEN 100 UNIT/ML ~~LOC~~ SOPN
PEN_INJECTOR | SUBCUTANEOUS | 2 refills | Status: DC
Start: 2020-09-12 — End: 2022-08-07

## 2020-09-12 NOTE — Telephone Encounter (Signed)
Refilled Insulin as listed in Epic so patient would not be without

## 2020-09-12 NOTE — Telephone Encounter (Signed)
OK thank you 

## 2020-09-12 NOTE — Addendum Note (Signed)
Addended by: Alvina Filbert B on: 09/12/2020 05:33 PM   Modules accepted: Orders

## 2020-09-12 NOTE — Telephone Encounter (Signed)
Spoke with patient's spouse. Patient in the background answering questions, agitated. Patient and spouse recently had covid vaccine and today patient developed some dizziness and a headache.Patient is not dizzy at this time. BP is 187/86 with a HR of 62. Patient is going to take Tylenol for his headache. Patient has not taken his daily BP medication. Advised patient to take daily medications and to call back in by 3pm if blood pressure has not decreased. Patient should not continually check blood pressure but should reassess to ensure medications have worked. Patient and spouse verbalized understanding.

## 2020-09-12 NOTE — Telephone Encounter (Signed)
° °  STAT if patient feels like he/she is going to faint   1) Are you dizzy now? yes  2) Do you feel faint or have you passed out? no  3) Do you have any other symptoms? headache  4) Have you checked your HR and BP (record if available)? No. Wife said he is agitated and she can not get him to take it at this time   Wife of the patient called. Patient and his wife both got the second dose of their COVID vaccine yesterday. The wife states that the patient is having intense headaches. The wife is not sure what to do.

## 2020-09-17 NOTE — Progress Notes (Deleted)
Cardiology Office Note:    Date:  09/17/2020   ID:  Earl Calderon, DOB 1950-10-21, MRN [id number]  PCP:  Pcp, No  Cardiologist:  [name], MD   Referring MD: No ref. provider found   No chief complaint on file. ***  History of Present Illness:    Earl Calderon is a 70 y.o. male with a hx of hypertension, chronic systolic and diastolic heart failure, DM2, CKD stage III, hx of stroke, HLD, CAD with prior PCI. He was initially seen in March 2020 with new onset CHF. Echo showed EF 25-30% and right and left heart cath was performed. A 99% lesion in the mid LAD was treated with DES. There was suspicion for cardiac amyloidosis at that time. Medical therapy was titrated and he was discharged later that month. He did not follow up with cardiology and presented back to the ER 07/2020 with CHF and stroke. He had not taken any cardiac medications in over a years citing that he wanted to detox his body. Repeat echo at that time showed EF 30-35% with global hypokinesis and LVH. PYP scan during that hospitalization was not suggested for ATTR amyloidosis. He was discharged with coreg, entresto. [name], bidil, and farxiga. Unfortunately, he was unable to get spiro or bidil from his pharmacy. He was last seen in clinic by Dr. Oval Linsey 07/24/20. At that time, he did not have a BP cuff and was not weighing himself. BP was well-controlled at that visit without the spiro and bidil, these were not re-prescribed. He was also given a BP cuff and scale. He was on the dry side and lasix was reduced to daily.      Acute on chronic systolic and diastolic heart failure - maintained on coreg, entrestor, farxiga, and lasix    CAD s/p DES to LAD (10/2018) - continue ASA and 40 mg crestor   Hyperlipidemia with LDL goal < 70 07/12/2020: Cholesterol 146; HDL 41; LDL Cholesterol 92; Triglycerides 66; VLDL 13 - update lipid profile   Hypertension - medications as above - he was provided a BP cuff at last  appt   CKD stage III - 1.29 (0.99) 07/24/20 - he was instructed to repeat BMP in 1 month, but this did not happen - collect BMP today    DM - restarted on insulin at last visit - A1c 9.2% - who follows this?         Past Medical History:  Diagnosis Date  . CKD (chronic kidney disease), stage III ([place]) 08/24/2020  . Coronary artery disease   . Diabetes mellitus without complication ([place])   . Hernia, inguinal   . Hypertension   . NSVT (nonsustained ventricular tachycardia) ([place]) 08/24/2020  . Uncontrolled type 2 diabetes mellitus ([place]) 11/11/2018    Past Surgical History:  Procedure Laterality Date  . CORONARY STENT INTERVENTION N/A 11/15/2018   Procedure: CORONARY STENT INTERVENTION;  Surgeon: [name], MD;  Location: [facility];  Service: Cardiovascular;  Laterality: N/A;  . HEMORROIDECTOMY    . RIGHT HEART CATH N/A 07/18/2020   Procedure: RIGHT HEART CATH;  Surgeon: [name], MD;  Location: [facility];  Service: Cardiovascular;  Laterality: N/A;  . RIGHT/LEFT HEART CATH AND CORONARY ANGIOGRAPHY N/A 11/15/2018   Procedure: RIGHT/LEFT HEART CATH AND CORONARY ANGIOGRAPHY;  Surgeon: [name], MD;  Location: [place] [facility];  Service: Cardiovascular;  Laterality: N/A;    Current Medications: No outpatient medications have been marked as taking for the  09/24/20 encounter (Appointment) with Ledora Bottcher, [place].     Allergies:   Patient has no known allergies.   Social History   Socioeconomic History  . Marital status: Single    Spouse name: Not on file  . Number of children: Not on file  . Years of education: Not on file  . Highest education level: Not on file  Occupational History  . Not on file  Tobacco Use  . Smoking status: Never Smoker  . Smokeless tobacco: Never Used  Vaping Use  . Vaping Use: Never used  Substance and Sexual Activity  . Alcohol use: Never  . Drug use: Never  . Sexual activity: Not  on file  Other Topics Concern  . Not on file  Social History Narrative  . Not on file   Social Determinants of Health   Financial Resource Strain: Not on file  Food Insecurity: Not on file  Transportation Needs: Not on file  Physical Activity: Not on file  Stress: Not on file  Social Connections: Not on file     Family History: The patient's ***family history includes Arrhythmia in his mother; Diabetes Mellitus II in his mother and another family member; Heart disease in his mother; Hypertension in his brother, mother, sister, and another family member.  ROS:   Please see the history of present illness.    *** All other systems reviewed and are negative.  EKGs/Labs/Other Studies Reviewed:    The following studies were reviewed today: ***  EKG:  EKG is *** ordered today.  The ekg ordered today demonstrates ***  Recent Labs: 07/11/2020: ALT 11; Platelets 180 07/17/2020: B Natriuretic Peptide 241.4 07/18/2020: Hemoglobin 11.6; Hemoglobin 11.6 07/19/2020: Magnesium 2.1 07/24/2020: BUN 32; Creatinine, Ser 1.29; Potassium 4.1; Sodium 141  Recent Lipid Panel    Component Value Date/Time   CHOL 146 07/12/2020 0510   TRIG 66 07/12/2020 0510   HDL 41 07/12/2020 0510   CHOLHDL 3.6 07/12/2020 0510   VLDL 13 07/12/2020 0510   LDLCALC 92 07/12/2020 0510    Physical Exam:    VS:  There were no vitals taken for this visit.    Wt Readings from Last 3 Encounters:  07/24/20 143 lb 12.8 oz (65.2 kg)  07/18/20 146 lb (66.2 kg)  03/14/19 140 lb (63.5 kg)     GEN: *** Well nourished, well developed in no acute distress HEENT: Normal NECK: No JVD; No carotid bruits LYMPHATICS: No lymphadenopathy CARDIAC: ***RRR, no murmurs, rubs, gallops RESPIRATORY:  Clear to auscultation without rales, wheezing or rhonchi  ABDOMEN: Soft, non-tender, non-distended MUSCULOSKELETAL:  No edema; No deformity  SKIN: Warm and dry NEUROLOGIC:  Alert and oriented x 3 PSYCHIATRIC:  Normal affect    ASSESSMENT:    No diagnosis found. PLAN:    In order of problems listed above:  No diagnosis found.   Medication Adjustments/Labs and Tests Ordered: Current medicines are reviewed at length with the patient today.  Concerns regarding medicines are outlined above.  No orders of the defined types were placed in this encounter.  No orders of the defined types were placed in this encounter.   Signed, Ledora Bottcher, [place]  09/17/2020 2:46 PM    [facility]

## 2020-09-18 ENCOUNTER — Ambulatory Visit: Payer: Medicare Other | Admitting: Cardiology

## 2020-09-21 ENCOUNTER — Telehealth: Payer: Self-pay | Admitting: Licensed Clinical Social Worker

## 2020-09-21 NOTE — Progress Notes (Signed)
Heart and Vascular Care Navigation  09/21/2020  Earl Calderon [birth year] [id number]  Reason for Referral:  CSW received message from scheduler stating that pt will need additional assistance with transportation to appointment Monday 1/24.                                            Assessment:  CSW spoke with pt at [phone]. Introduced self, role, reason for call. LCSW aware that pt already has been enrolled in transportation services by Care Guide. He shares that he has lost that number. I shared that I would assist with arranging ride and mail him Transportation Services card.   I confirmed home address, emergency contact. Pt shares that he does not have a PCP- he is okay with this Probation officer sending information on how to identify a PCP. I inquired if there was any additional needs that pt/pt family have at this time. He shares they get by with the bills, I offered connection to rental assistance, food stamps, and additional programs. Pt not interested at this time.                                  HRT/VAS Care Coordination    Patients Home Cardiology Office [place] Team Social Worker   Social Worker Name: Westley Hummer, LCSW, [place] arrangements for the past 2 months Single Family Home   Lives with: Spouse   Patient Current Insurance Coverage Medicaid; Traditional Medicare   Patient Has Concern With Paying Medical Bills No   Does Patient Have Prescription Coverage? Yes   Home Assistive Devices/Equipment Cane (specify quad or straight); CBG Meter; Eyeglasses  single point cane      Social History:                                                                             SDOH Screenings   Alcohol Screen: Not on file  Depression (RSW5-4): Not on file  Financial Resource Strain: Medium Risk  . Difficulty of Paying Living Expenses: Somewhat hard  Food Insecurity: No Food Insecurity  . Worried About Charity fundraiser in the  Last Year: Never true  . Ran Out of Food in the Last Year: Never true  Housing: Low Risk   . Last Housing Risk Score: 0  Physical Activity: Not on file  Social Connections: Not on file  Stress: Not on file  Tobacco Use: Low Risk   . Smoking Tobacco Use: Never Smoker  . Smokeless Tobacco Use: Never Used  Transportation Needs: Unmet Transportation Needs  . Lack of Transportation (Medical): Yes  . Lack of Transportation (Non-Medical): Yes    SDOH Interventions: Financial Resources:  Pt shares they "get by", declines any assistance at this time.    Food Insecurity:  Food Insecurity Interventions: Intervention Not Indicated  Housing Insecurity:  Housing Interventions: Intervention Not Indicated  Transportation:   Transportation Interventions: Financial planner   Follow-up plan:   CSW mailed pt information about PCP providers  w/ [facility], I also have sent my card and Transportation Services card through mail. Ride scheduled for pt appointment Monday 1/24 at 1:45, pick up scheduled for 12:30pm, pt aware they will call and confirm ride with pt.

## 2020-09-24 ENCOUNTER — Ambulatory Visit: Payer: Medicare Other | Admitting: Physician Assistant

## 2020-09-25 NOTE — Progress Notes (Unsigned)
Cardiology Office Note:    Date:  09/26/2020   ID:  Earl Calderon, DOB 03/25/1951, MRN [id number]  PCP:  Pcp, No  Cardiologist:  [name], MD   Referring MD: No ref. provider found   Chief Complaint  Patient presents with  . Follow-up    Follow-up  CHF  History of Present Illness:    Earl Calderon is a 70 y.o. male with a hx of hypertension, chronic systolic and diastolic heart failure, DM2, CKD stage III, hx of stroke, HLD, CAD with prior PCI. He was initially seen in March 2020 with new onset CHF. Echo showed EF 25-30% and right and left heart cath was performed. A 99% lesion in the mid LAD was treated with DES. There was suspicion for cardiac amyloidosis at that time. Medical therapy was titrated and he was discharged later that month. He did not follow up with cardiology and presented back to the ER 07/2020 with CHF and stroke. He had not taken any cardiac medications in over a years citing that he wanted to detox his body. Repeat echo at that time showed EF 30-35% with global hypokinesis and LVH. PYP scan during that hospitalization was not suggested for ATTR amyloidosis. He was discharged with coreg, entresto. [name], bidil, and farxiga. Unfortunately, he was unable to get spiro or bidil from his pharmacy. He was last seen in clinic by Dr. Oval Linsey 07/24/20. At that time, he did not have a BP cuff and was not weighing himself. BP was well-controlled at that visit without the spiro and bidil, these were not re-prescribed. He was also given a BP cuff and scale. He was on the dry side and lasix was reduced to daily.   He presents today for follow up. He is taking all medications. We discussed taking medications closer to 12 hrs apart. He reports being tired and dizzy. He states his vision is also impaired - can't see like he used to. He does not have have a PCP - has not seen PCP in over a year. He has not seen an eye doctor. Vision is blurry. He is also dizzy because he can't  see well when he walks. No new problems. He has been using his BP cuff and reports SBP < 150 and DBP < 93 - generally in the 140s/90s. He is trying to be mindful of salt. He weighs daily and is about 150 lbs at dry weight. Congratulated him on being compliant with medications and appts.     Past Medical History:  Diagnosis Date  . CKD (chronic kidney disease), stage III ([place]) 08/24/2020  . Coronary artery disease   . Diabetes mellitus without complication ([place])   . Hernia, inguinal   . Hypertension   . NSVT (nonsustained ventricular tachycardia) ([place]) 08/24/2020  . Uncontrolled type 2 diabetes mellitus ([place]) 11/11/2018    Past Surgical History:  Procedure Laterality Date  . CORONARY STENT INTERVENTION N/A 11/15/2018   Procedure: CORONARY STENT INTERVENTION;  Surgeon: [name], MD;  Location: [facility];  Service: Cardiovascular;  Laterality: N/A;  . HEMORROIDECTOMY    . RIGHT HEART CATH N/A 07/18/2020   Procedure: RIGHT HEART CATH;  Surgeon: [name], MD;  Location: [facility];  Service: Cardiovascular;  Laterality: N/A;  . RIGHT/LEFT HEART CATH AND CORONARY ANGIOGRAPHY N/A 11/15/2018   Procedure: RIGHT/LEFT HEART CATH AND CORONARY ANGIOGRAPHY;  Surgeon: [name], MD;  Location: [facility];  Service: Cardiovascular;  Laterality: N/A;  Current Medications: Current Meds  Medication Sig  . aspirin EC 81 MG tablet Take 1 tablet (81 mg total) by mouth daily. Swallow whole.  . carvedilol (COREG) 25 MG tablet Take 1 tablet (25 mg total) by mouth 2 (two) times daily with a meal.  . furosemide (LASIX) 40 MG tablet Take 1 tablet (40 mg total) by mouth daily.  . insulin aspart (NOVOLOG FLEXPEN) 100 UNIT/ML FlexPen Before each meal 3 times a day, 140-199 - 2 units, 200-250 - 4 units, 251-299 - 6 units,  300-349 - 8 units,  350 or above 10 units. Insulin PEN if approved, provide syringes and needles if needed.  . Insulin Pen Needle (PEN  NEEDLES 29GX1/2") 29G X 12MM MISC Diabetes pen needles for a month supply  . isosorbide mononitrate (IMDUR) 30 MG 24 hr tablet Take 1 tablet (30 mg total) by mouth daily. At Night  . rosuvastatin (CRESTOR) 40 MG tablet Take 1 tablet (40 mg total) by mouth daily at 6 PM.  . sacubitril-valsartan (ENTRESTO) 97-103 MG Take 1 tablet by mouth 2 (two) times daily.     Allergies:   Patient has no known allergies.   Social History   Socioeconomic History  . Marital status: Single    Spouse name: Not on file  . Number of children: Not on file  . Years of education: Not on file  . Highest education level: Not on file  Occupational History  . Not on file  Tobacco Use  . Smoking status: Never Smoker  . Smokeless tobacco: Never Used  Vaping Use  . Vaping Use: Never used  Substance and Sexual Activity  . Alcohol use: Never  . Drug use: Never  . Sexual activity: Not on file  Other Topics Concern  . Not on file  Social History Narrative  . Not on file   Social Determinants of Health   Financial Resource Strain: Medium Risk  . Difficulty of Paying Living Expenses: Somewhat hard  Food Insecurity: No Food Insecurity  . Worried About Charity fundraiser in the Last Year: Never true  . Ran Out of Food in the Last Year: Never true  Transportation Needs: Unmet Transportation Needs  . Lack of Transportation (Medical): Yes  . Lack of Transportation (Non-Medical): Yes  Physical Activity: Not on file  Stress: Not on file  Social Connections: Not on file     Family History: The patient's family history includes Arrhythmia in his mother; Diabetes Mellitus II in his mother and another family member; Heart disease in his mother; Hypertension in his brother, mother, sister, and another family member.  ROS:   Please see the history of present illness.     All other systems reviewed and are negative.  EKGs/Labs/Other Studies Reviewed:    The following studies were reviewed today:  Echo  07/10/20: 1. Left ventricular ejection fraction, by estimation, is 30 to 35%. The  left ventricle has moderately decreased function. The left ventricle  demonstrates global hypokinesis. There is moderate concentric left  ventricular hypertrophy. Left ventricular  diastolic parameters are consistent with Grade II diastolic dysfunction  (pseudonormalization). Elevated left atrial pressure.  2. Right ventricular systolic function is moderately reduced. The right  ventricular size is mildly enlarged. Mildly increased right ventricular  wall thickness. There is moderately elevated pulmonary artery systolic  pressure. The estimated right  ventricular systolic pressure is [id number] mmHg.  3. Left atrial size was severely dilated.  4. Right atrial size was moderately dilated.  5. The  mitral valve is normal in structure. Mild mitral valve  regurgitation.  6. Tricuspid valve regurgitation is moderate.  7. The aortic valve is tricuspid. Aortic valve regurgitation is not  visualized. No aortic stenosis is present.  8. The inferior vena cava is dilated in size with <50% respiratory  variability, suggesting right atrial pressure of 15 mmHg.   EKG:  EKG is not ordered today.    Recent Labs: 07/11/2020: ALT 11; Platelets 180 07/17/2020: B Natriuretic Peptide 241.4 07/18/2020: Hemoglobin 11.6; Hemoglobin 11.6 07/19/2020: Magnesium 2.1 07/24/2020: BUN 32; Creatinine, Ser 1.29; Potassium 4.1; Sodium 141  Recent Lipid Panel    Component Value Date/Time   CHOL 146 07/12/2020 0510   TRIG 66 07/12/2020 0510   HDL 41 07/12/2020 0510   CHOLHDL 3.6 07/12/2020 0510   VLDL 13 07/12/2020 0510   LDLCALC 92 07/12/2020 0510    Physical Exam:    VS:  BP (!) 150/70 (BP Location: Left Arm, Patient Position: Sitting, Cuff Size: Normal)   Pulse 65   Ht 5\' 10"  (1.778 m)   Wt 150 lb (68 kg)   SpO2 98%   BMI 21.52 kg/m     Wt Readings from Last 3 Encounters:  09/26/20 150 lb (68 kg)  07/24/20 143 lb  12.8 oz (65.2 kg)  07/18/20 146 lb (66.2 kg)     GEN: Elderly thin male in [place]: Normal NECK: No JVD; No carotid bruits LYMPHATICS: No lymphadenopathy CARDIAC: RRR, no murmurs, rubs, gallops RESPIRATORY:  Clear to auscultation without rales, wheezing or rhonchi  ABDOMEN: Soft, non-tender, non-distended MUSCULOSKELETAL:  No edema; No deformity  SKIN: Warm and dry NEUROLOGIC:  Alert and oriented x 3 PSYCHIATRIC:  Normal affect   ASSESSMENT:    1. Chronic combined systolic and diastolic heart failure ([place])   2. Coronary artery disease involving native coronary artery of native heart with unstable angina pectoris ([place])   3. Status post coronary artery stent placement   4. Primary hypertension   5. Hyperlipidemia with target LDL less than 70   6. Uncontrolled type 2 diabetes mellitus with hyperglycemia ([place])   7. Fatigue, unspecified type    PLAN:    In order of problems listed above:  Acute on chronic systolic and diastolic heart failure - maintained on coreg, entresto, farxiga, and lasix - he appears euvolemic - he is compliant with medications, but was taking them too close together - discussed 12 hrs apart - he is weighing daily and he is near 150 lbs for dry weight   CAD s/p DES to LAD (10/2018) - continue ASA and 40 mg crestor - no chest pain - encouraged more activity   Hyperlipidemia with LDL goal < 70 07/12/2020: Cholesterol 146; HDL 41; LDL Cholesterol 92; Triglycerides 66; VLDL 13 - continue 40 mg crestor - will update lipid profile at next visit when fasting   Hypertension - medications as above - he was provided a BP cuff at last appt and he is using it - BP in the 140/90s - would ideally like his BP to be lower given DM and CKD - pt has problems getting to appts and it will be difficult for him to come back for labs, he also doesn't like taking medication so I hesitate to add a TID medication (was on spiro and bidil) - will add 30 mg imdur to be taken  at night and have encouraged an eye exam given blurry vision and DM   CKD stage III - 1.29 (0.99) 07/24/20 -  he was instructed to repeat BMP in 1 month, but this did not happen - collect BMP today   DM - restarted on insulin at last visit - A1c 9.2% - he does not have a PCP --> will [facility] to [facility]   Fatigue - he reports fatigue, but his might be related to a more normalized BP and lower BG - will check CBC, BMP, and TSH   Keep follow up appt in 1 month.   Medication Adjustments/Labs and Tests Ordered: Current medicines are reviewed at length with the patient today.  Concerns regarding medicines are outlined above.  Orders Placed This Encounter  Procedures  . Basic metabolic panel  . CBC  . TSH   Meds ordered this encounter  Medications  . isosorbide mononitrate (IMDUR) 30 MG 24 hr tablet    Sig: Take 1 tablet (30 mg total) by mouth daily. At Night    Dispense:  90 tablet    Refill:  0    Signed, Ledora Bottcher, [place]  09/26/2020 12:42 PM    [facility]

## 2020-09-26 ENCOUNTER — Encounter: Payer: Self-pay | Admitting: Physician Assistant

## 2020-09-26 ENCOUNTER — Ambulatory Visit (INDEPENDENT_AMBULATORY_CARE_PROVIDER_SITE_OTHER): Payer: Medicare Other | Admitting: Physician Assistant

## 2020-09-26 ENCOUNTER — Other Ambulatory Visit: Payer: Self-pay

## 2020-09-26 VITALS — BP 150/70 | HR 65 | Ht 70.0 in | Wt 150.0 lb

## 2020-09-26 DIAGNOSIS — I10 Essential (primary) hypertension: Secondary | ICD-10-CM | POA: Diagnosis not present

## 2020-09-26 DIAGNOSIS — I25110 Atherosclerotic heart disease of native coronary artery with unstable angina pectoris: Secondary | ICD-10-CM

## 2020-09-26 DIAGNOSIS — Z955 Presence of coronary angioplasty implant and graft: Secondary | ICD-10-CM

## 2020-09-26 DIAGNOSIS — E1165 Type 2 diabetes mellitus with hyperglycemia: Secondary | ICD-10-CM

## 2020-09-26 DIAGNOSIS — E785 Hyperlipidemia, unspecified: Secondary | ICD-10-CM

## 2020-09-26 DIAGNOSIS — R5383 Other fatigue: Secondary | ICD-10-CM

## 2020-09-26 DIAGNOSIS — I5042 Chronic combined systolic (congestive) and diastolic (congestive) heart failure: Secondary | ICD-10-CM

## 2020-09-26 MED ORDER — ISOSORBIDE MONONITRATE ER 30 MG PO TB24: 30.0000 mg | ORAL_TABLET | Freq: Every day | ORAL | 0 refills | Status: DC

## 2020-09-26 NOTE — Patient Instructions (Addendum)
Medication Instructions:  *Start Imdur 30 mg ( 1 Tablet Daily at Night) *If you need a refill on your cardiac medications before your next appointment, please call your pharmacy*   Lab Work: [facility] If you have labs (blood work) drawn today and your tests are completely normal, you will receive your results only by: Marland Kitchen MyChart Message (if you have MyChart) OR . A paper copy in the mail If you have any lab test that is abnormal or we need to change your treatment, we will call you to review the results.   Testing/Procedures: No Testing   Follow-Up: At [facility], you and your health needs are our priority.  As part of our continuing mission to provide you with exceptional heart care, we have created designated Provider Care Teams.  These Care Teams include your primary Cardiologist (physician) and Advanced Practice Providers (APPs -  Physician Assistants and Nurse Practitioners) who all work together to provide you with the care you need, when you need it.   Your next appointment:   3 month(s)  The format for your next appointment:   In Person  Provider:   [name], MD   [facility] health and [place]  [phone]

## 2020-09-27 LAB — CBC
Hematocrit: 32.7 % — ABNORMAL LOW (ref 37.5–51.0)
Hemoglobin: 11 g/dL — ABNORMAL LOW (ref 13.0–17.7)
MCH: 31.2 pg (ref 26.6–33.0)
MCHC: 33.6 g/dL (ref 31.5–35.7)
MCV: 93 fL (ref 79–97)
Platelets: 179 10*3/uL (ref 150–450)
RBC: 3.53 x10E6/uL — ABNORMAL LOW (ref 4.14–5.80)
RDW: 12.2 % (ref 11.6–15.4)
WBC: 6.4 10*3/uL (ref 3.4–10.8)

## 2020-09-27 LAB — BASIC METABOLIC PANEL
BUN/Creatinine Ratio: 21 (ref 10–24)
BUN: 24 mg/dL (ref 8–27)
CO2: 27 mmol/L (ref 20–29)
Calcium: 8.9 mg/dL (ref 8.6–10.2)
Chloride: 99 mmol/L (ref 96–106)
Creatinine, Ser: 1.12 mg/dL (ref 0.76–1.27)
GFR calc Af Amer: 78 mL/min/{1.73_m2} (ref >59)
GFR calc non Af Amer: 68 mL/min/{1.73_m2} (ref >59)
Glucose: 205 mg/dL — ABNORMAL HIGH (ref 65–99)
Potassium: 4.2 mmol/L (ref 3.5–5.2)
Sodium: 142 mmol/L (ref 134–144)

## 2020-09-27 LAB — TSH: TSH: 1.54 u[IU]/mL (ref 0.450–4.500)

## 2020-10-08 ENCOUNTER — Telehealth: Payer: Self-pay | Admitting: Cardiovascular Disease

## 2020-10-08 NOTE — Telephone Encounter (Signed)
Left message for pt to call.

## 2020-10-08 NOTE — Telephone Encounter (Signed)
Patient is returning call to review lab results.  

## 2020-10-09 ENCOUNTER — Telehealth: Payer: Self-pay | Admitting: Cardiovascular Disease

## 2020-10-09 ENCOUNTER — Other Ambulatory Visit: Payer: Self-pay | Admitting: *Deleted

## 2020-10-09 DIAGNOSIS — E1165 Type 2 diabetes mellitus with hyperglycemia: Secondary | ICD-10-CM

## 2020-10-09 DIAGNOSIS — D649 Anemia, unspecified: Secondary | ICD-10-CM

## 2020-10-09 MED ORDER — FERROUS SULFATE 325 (65 FE) MG PO TABS: 325.0000 mg | ORAL_TABLET | Freq: Every day | ORAL | 3 refills | Status: DC

## 2020-10-09 NOTE — Telephone Encounter (Signed)
Returned call to patient no answer.Left message to call back. 

## 2020-10-09 NOTE — Telephone Encounter (Signed)
Received call back from patient.  Discussed results and recommendations.   He would like to get established with PCP in [place].   Iron rx sent to pharmacy per pt request.  Per chart review-patient has medicare and CSW has spoke with patients multiple times.  List of PCP was mailed 09/21/20.   He gets assistance with transportation through [employer] [facility].     [facility] to get established.

## 2020-10-09 NOTE — Telephone Encounter (Signed)
Patient called to receive results

## 2020-10-09 NOTE — Telephone Encounter (Signed)
Spoke to patient he stated he already received a call from [facility].

## 2020-10-16 ENCOUNTER — Ambulatory Visit: Payer: Self-pay | Admitting: *Deleted

## 2020-10-16 NOTE — Telephone Encounter (Signed)
Headache for 2 days, blurred vision. Dizziness for a while since November. Patient has multiple chronic symptoms- he does have a NP appointment schedule- but it is over 1 month out. Patient states he is diabetic and is using insulin without a meter. He is advised per protocol that he needs to be seen within 24 hour period at [facility]- patient states he has transportation problems and can not get a ride within that time period. Patient states he can get a ride Th/Fr. Call to office and they are going to see if they can get him to go to mobile unit Th.  Reason for Disposition . [1] MODERATE headache (e.g., interferes with normal activities) AND [2] present > 24 hours AND [3] unexplained  (Exceptions: analgesics not tried, typical migraine, or headache part of viral illness)  Answer Assessment - Initial Assessment Questions 1. LOCATION: "Where does it hurt?"      Left side and front of head 2. ONSET: "When did the headache start?" (Minutes, hours or days)     For a while 3. PATTERN: "Does the pain come and go, or has it been constant since it started?"     Comes and goes 4. SEVERITY: "How bad is the pain?" and "What does it keep you from doing?"  (e.g., Scale 1-10; mild, moderate, or severe)   - MILD (1-3): doesn't interfere with normal activities    - MODERATE (4-7): interferes with normal activities or awakens from sleep    - SEVERE (8-10): excruciating pain, unable to do any normal activities        Mild/moderate 5. RECURRENT SYMPTOM: "Have you ever had headaches before?" If Yes, ask: "When was the last time?" and "What happened that time?"      Yes- no treatment 6. CAUSE: "What do you think is causing the headache?"     unsure 7. MIGRAINE: "Have you been diagnosed with migraine headaches?" If Yes, ask: "Is this headache similar?"      no 8. HEAD INJURY: "Has there been any recent injury to the head?"      no 9. OTHER SYMPTOMS: "Do you have any other symptoms?" (fever, stiff neck, eye pain,  sore throat, cold symptoms)     Eye pain- burred vision 10. PREGNANCY: "Is there any chance you are pregnant?" "When was your last menstrual period?"       n/a  Protocols used: HEADACHE-A-AH

## 2020-10-17 NOTE — Telephone Encounter (Signed)
Called patient and LVM advising him to call [phone] to schedule an appointment tomorrow. Nurse said it was ok to take Dr. Smitty Pluck physical slot for tomorrow if patient calls back.

## 2020-10-18 ENCOUNTER — Ambulatory Visit (INDEPENDENT_AMBULATORY_CARE_PROVIDER_SITE_OTHER): Payer: Medicare Other | Admitting: Primary Care

## 2020-10-18 ENCOUNTER — Telehealth (INDEPENDENT_AMBULATORY_CARE_PROVIDER_SITE_OTHER): Payer: Self-pay

## 2020-10-18 ENCOUNTER — Other Ambulatory Visit: Payer: Self-pay

## 2020-10-18 ENCOUNTER — Encounter (INDEPENDENT_AMBULATORY_CARE_PROVIDER_SITE_OTHER): Payer: Self-pay | Admitting: Primary Care

## 2020-10-18 VITALS — BP 96/58 | HR 58 | Temp 97.3°F | Ht 70.0 in | Wt 152.4 lb

## 2020-10-18 DIAGNOSIS — D649 Anemia, unspecified: Secondary | ICD-10-CM

## 2020-10-18 DIAGNOSIS — Z7689 Persons encountering health services in other specified circumstances: Secondary | ICD-10-CM

## 2020-10-18 DIAGNOSIS — I959 Hypotension, unspecified: Secondary | ICD-10-CM

## 2020-10-18 DIAGNOSIS — I5042 Chronic combined systolic (congestive) and diastolic (congestive) heart failure: Secondary | ICD-10-CM | POA: Diagnosis not present

## 2020-10-18 DIAGNOSIS — E1165 Type 2 diabetes mellitus with hyperglycemia: Secondary | ICD-10-CM

## 2020-10-18 DIAGNOSIS — H539 Unspecified visual disturbance: Secondary | ICD-10-CM

## 2020-10-18 LAB — POCT GLYCOSYLATED HEMOGLOBIN (HGB A1C): Hemoglobin A1C: 7.7 % — AB (ref 4.0–5.6)

## 2020-10-18 LAB — GLUCOSE, POCT (MANUAL RESULT ENTRY): POC Glucose: 220 mg/dl — AB (ref 70–99)

## 2020-10-18 NOTE — Telephone Encounter (Signed)
Copied from [place] [id number]. Topic: General - Other >> Oct 17, 2020  4:45 PM Pawlus, Brayton Layman A wrote: Reason for CRM: Earl Calderon called on Pts behalf extremely upset that the PT apparently lied to get an appt and wanted to inform the doctor that he has diabetes and is going blind. Caller was very agitated.

## 2020-10-18 NOTE — Progress Notes (Signed)
Established Patient Office Visit  Subjective:  Patient ID: Earl Calderon, male    DOB: 06-27-1951  Age: 70 y.o. MRN: [id number]  CC:  Chief Complaint  Patient presents with  . New Patient (Initial Visit)    Diabetes     HPI Earl Calderon is a 70 year old male who presents for  Establishment of care and management of type 2 diabetes. DIABETES, Hypoglycemic episodes:no, Polydipsia/polyuria: yes, Visual disturbance: yes , Chest pain: no Paresthesias: yes off and on , Glucose Monitoring: no need glucometer .Denies chest pain or lower extremity edema. Admits to Shortness of breath with exertion and headaches. Today Bp was 96/58 (hypotensive refer to cardiology)   Past Medical History:  Diagnosis Date  . CKD (chronic kidney disease), stage III ([place]) 08/24/2020  . Coronary artery disease   . Diabetes mellitus without complication ([place])   . Hernia, inguinal   . Hypertension   . NSVT (nonsustained ventricular tachycardia) ([place]) 08/24/2020  . Uncontrolled type 2 diabetes mellitus ([place]) 11/11/2018    Past Surgical History:  Procedure Laterality Date  . CORONARY STENT INTERVENTION N/A 11/15/2018   Procedure: CORONARY STENT INTERVENTION;  Surgeon: [name], MD;  Location: [facility];  Service: Cardiovascular;  Laterality: N/A;  . HEMORROIDECTOMY    . RIGHT HEART CATH N/A 07/18/2020   Procedure: RIGHT HEART CATH;  Surgeon: [name], MD;  Location: [facility];  Service: Cardiovascular;  Laterality: N/A;  . RIGHT/LEFT HEART CATH AND CORONARY ANGIOGRAPHY N/A 11/15/2018   Procedure: RIGHT/LEFT HEART CATH AND CORONARY ANGIOGRAPHY;  Surgeon: [name], MD;  Location: [facility];  Service: Cardiovascular;  Laterality: N/A;    Family History  Problem Relation Age of Onset  . Hypertension Mother   . Diabetes Mellitus II Mother   . Arrhythmia Mother   . Heart disease Mother   . Hypertension Sister   . Hypertension Brother   .  Hypertension Other   . Diabetes Mellitus II Other        All 9 sibblings have HTN    Social History   Socioeconomic History  . Marital status: Single    Spouse name: Not on file  . Number of children: Not on file  . Years of education: Not on file  . Highest education level: Not on file  Occupational History  . Not on file  Tobacco Use  . Smoking status: Never Smoker  . Smokeless tobacco: Never Used  Vaping Use  . Vaping Use: Never used  Substance and Sexual Activity  . Alcohol use: Never  . Drug use: Never  . Sexual activity: Not on file  Other Topics Concern  . Not on file  Social History Narrative  . Not on file   Social Determinants of Health   Financial Resource Strain: Medium Risk  . Difficulty of Paying Living Expenses: Somewhat hard  Food Insecurity: No Food Insecurity  . Worried About Charity fundraiser in the Last Year: Never true  . Ran Out of Food in the Last Year: Never true  Transportation Needs: Unmet Transportation Needs  . Lack of Transportation (Medical): Yes  . Lack of Transportation (Non-Medical): Yes  Physical Activity: Not on file  Stress: Not on file  Social Connections: Not on file  Intimate Partner Violence: Not on file    Outpatient Medications Prior to Visit  Medication Sig Dispense Refill  . aspirin EC 81 MG tablet Take 1 tablet (81 mg total) by  mouth daily. Swallow whole. 90 tablet 3  . dapagliflozin propanediol (FARXIGA) 10 MG TABS tablet Take 1 tablet (10 mg total) by mouth daily before breakfast. 90 tablet 1  . ferrous sulfate 325 (65 FE) MG tablet Take 1 tablet (325 mg total) by mouth daily with breakfast. 30 tablet 3  . furosemide (LASIX) 40 MG tablet Take 1 tablet (40 mg total) by mouth daily. 90 tablet 3  . insulin aspart (NOVOLOG FLEXPEN) 100 UNIT/ML FlexPen Before each meal 3 times a day, 140-199 - 2 units, 200-250 - 4 units, 251-299 - 6 units,  300-349 - 8 units,  350 or above 10 units. Insulin PEN if approved, provide  syringes and needles if needed. 15 mL 2  . isosorbide mononitrate (IMDUR) 30 MG 24 hr tablet Take 1 tablet (30 mg total) by mouth daily. At Night 90 tablet 0  . LEVEMIR FLEXTOUCH 100 UNIT/ML FlexPen Inject into the skin.    Marland Kitchen sacubitril-valsartan (ENTRESTO) 97-103 MG Take 1 tablet by mouth 2 (two) times daily. 180 tablet 3  . Insulin Pen Needle (PEN NEEDLES 29GX1/2") 29G X 12MM MISC Diabetes pen needles for a month supply 100 each 0  . blood glucose meter kit and supplies KIT Dispense based on patient and insurance preference. Use up to four times daily as directed. (FOR ICD-9 250.00, 250.01). (Patient not taking: No sig reported) 1 each 0  . carvedilol (COREG) 25 MG tablet Take 1 tablet (25 mg total) by mouth 2 (two) times daily with a meal. 180 tablet 3  . NOVOFINE PEN NEEDLE 32G X 6 MM MISC SMARTSIG:Syringe(s) SUB-Q    . rosuvastatin (CRESTOR) 40 MG tablet Take 1 tablet (40 mg total) by mouth daily at 6 PM. 90 tablet 3   No facility-administered medications prior to visit.    No Known Allergies  ROS Pertinent positive and negative noted in HPI    Objective:     Wt Readings from Last 3 Encounters:  10/18/20 152 lb 6.4 oz (69.1 kg)  09/26/20 150 lb (68 kg)  07/24/20 143 lb 12.8 oz (65.2 kg)   Physical Exam Vitals:   10/18/20 1603  BP: (!) 96/58  Pulse: (!) 58  Temp: (!) 97.3 F (36.3 C)  TempSrc: Temporal  SpO2: 98%  Weight: 152 lb 6.4 oz (69.1 kg)  Height: _0  (1.778 m)   General: Vital signs reviewed.  Patient is well-developed and well-nourished thin frame (Body mass index is 21.87 kg/m.), in no acute distress and cooperative with exam.  Head: Normocephalic and atraumatic. Eyes: EOMI, conjunctivae normal, no scleral icterus.  Neck: Supple, trachea midline, normal ROM, no JVD, masses, thyromegaly, or carotid bruit present.  Cardiovascular: RRR, S1 normal, S2 normal, no murmurs, gallops, or rubs. Pulmonary/Chest: Clear to auscultation bilaterally, no wheezes, rales, or  rhonchi. Abdominal: Soft, non-tender, non-distended, BS +, no masses, organomegaly, or guarding present.  Musculoskeletal: No joint deformities, erythema, or stiffness, ROM full and nontender. Extremities: No lower extremity edema bilaterally,  pulses symmetric and intact bilaterally. No cyanosis or clubbing. Neurological: A&O x3, Strength is normal and symmetric bilaterally,no focal motor deficit, Skin: Warm, dry and intact. No rashes or erythema. Psychiatric: Normal mood and affect. speech and behavior is normal. Cognition and memory are normal.    Health Maintenance Due  Topic Date Due  . Hepatitis C Screening  Never done  . COVID-19 Vaccine (1) Never done  . FOOT EXAM  Never done  . OPHTHALMOLOGY EXAM  Never done  . URINE MICROALBUMIN  Never done  . TETANUS/TDAP  Never done  . COLONOSCOPY (Pts 45-69yr Insurance coverage will need to be confirmed)  Never done  . PNA vac Low Risk Adult (1 of 2 - PCV13) Never done    There are no preventive care reminders to display for this patient.  Lab Results  Component Value Date   TSH 1.540 09/26/2020   Lab Results  Component Value Date   WBC 6.4 09/26/2020   HGB 11.0 (L) 09/26/2020   HCT 32.7 (L) 09/26/2020   MCV 93 09/26/2020   PLT 179 09/26/2020   Lab Results  Component Value Date   NA 142 09/26/2020   K 4.2 09/26/2020   CO2 27 09/26/2020   GLUCOSE 205 (H) 09/26/2020   BUN 24 09/26/2020   CREATININE 1.12 09/26/2020   BILITOT 0.8 07/11/2020   ALKPHOS 87 07/11/2020   AST 15 07/11/2020   ALT 11 07/11/2020   PROT 5.9 (L) 07/11/2020   ALBUMIN 2.2 (L) 07/11/2020   CALCIUM 8.9 09/26/2020   ANIONGAP 8 07/19/2020   Lab Results  Component Value Date   CHOL 146 07/12/2020   Lab Results  Component Value Date   HDL 41 07/12/2020   Lab Results  Component Value Date   LDLCALC 92 07/12/2020   Lab Results  Component Value Date   TRIG 66 07/12/2020   Lab Results  Component Value Date   CHOLHDL 3.6 07/12/2020   Lab  Results  Component Value Date   HGBA1C 7.7 (A) 10/18/2020      Assessment & Plan:   ADeloywas seen today for new patient (initial visit).  Diagnoses and all orders for this visit:  Uncontrolled type 2 diabetes mellitus with hyperglycemia ([place]  Goal of therapy: Less than 6.5 hemoglobin A1c.  Monitor foods that are high in carbohydrates are the following rice, potatoes, breads, sugars, and pastas.  Reduction in the intake (eating) will assist in lowering your blood sugars. -     HgB A1c7.7 previously 9.3  3 months ago improving no medication changes -     Glucose (CBG) -     Microalbumin/Creatinine Ratio, Urine -     Ambulatory referral to Ophthalmology  Encounter to establish care Establish care with new PCP  Hypotension, unspecified hypotension type Unclear if dehydrated increase water provided H2O . Patient to call cardiologist and give Bp reading today   Chronic combined systolic and diastolic heart failure ([place] Followed by cardiology   Vision disturbance -     Ambulatory referral to Ophthalmology  Anemia, unspecified type Chronic and of chronic disease this may be underlying cause fatigue   No orders of the defined types were placed in this encounter.   Follow-up: No follow-ups on file.    MKerin Perna NP

## 2020-10-18 NOTE — Progress Notes (Signed)
Impaired vision  Complains of feeling very weak

## 2020-10-18 NOTE — Patient Instructions (Signed)
[employer] medicine (25th ed., pp. [phone]). [place], [place]: Elsevier.">  Anemia  Anemia is a condition in which there is not enough red blood cells or hemoglobin in the blood. Hemoglobin is a substance in red blood cells that carries oxygen. When you do not have enough red blood cells or hemoglobin (are anemic), your body cannot get enough oxygen and your organs may not work properly. As a result, you may feel very tired or have other problems. What are the causes? Common causes of anemia include:  Excessive bleeding. Anemia can be caused by excessive bleeding inside or outside the body, including bleeding from the intestines or from heavy menstrual periods in females.  Poor nutrition.  Long-lasting (chronic) kidney, thyroid, and liver disease.  Bone marrow disorders, spleen problems, and blood disorders.  Cancer and treatments for cancer.  HIV (human immunodeficiency virus) and AIDS (acquired immunodeficiency syndrome).  Infections, medicines, and autoimmune disorders that destroy red blood cells. What are the signs or symptoms? Symptoms of this condition include:  Minor weakness.  Dizziness.  Headache, or difficulties concentrating and sleeping.  Heartbeats that feel irregular or faster than normal (palpitations).  Shortness of breath, especially with exercise.  Pale skin, lips, and nails, or cold hands and feet.  Indigestion and nausea. Symptoms may occur suddenly or develop slowly. If your anemia is mild, you may not have symptoms. How is this diagnosed? This condition is diagnosed based on blood tests, your medical history, and a physical exam. In some cases, a test may be needed in which cells are removed from the soft tissue inside of a bone and looked at under a microscope (bone marrow biopsy). Your health care provider may also check your stool (feces) for blood and may do additional testing to look for the cause of your bleeding. Other tests may  include:  Imaging tests, such as a CT scan or MRI.  A procedure to see inside your esophagus and stomach (endoscopy).  A procedure to see inside your colon and rectum (colonoscopy). How is this treated? Treatment for this condition depends on the cause. If you continue to lose a lot of blood, you may need to be treated at a hospital. Treatment may include:  Taking supplements of iron, vitamin Q68, or folic acid.  Taking a hormone medicine (erythropoietin) that can help to stimulate red blood cell growth.  Having a blood transfusion. This may be needed if you lose a lot of blood.  Making changes to your diet.  Having surgery to remove your spleen. Follow these instructions at home:  Take over-the-counter and prescription medicines only as told by your health care provider.  Take supplements only as told by your health care provider.  Follow any diet instructions that you were given by your health care provider.  Keep all follow-up visits as told by your health care provider. This is important. Contact a health care provider if:  You develop new bleeding anywhere in the body. Get help right away if:  You are very weak.  You are short of breath.  You have pain in your abdomen or chest.  You are dizzy or feel faint.  You have trouble concentrating.  You have bloody stools, black stools, or tarry stools.  You vomit repeatedly or you vomit up blood. These symptoms may represent a serious problem that is an emergency. Do not wait to see if the symptoms will go away. Get medical help right away. Call your local emergency services (911 in the [place] Do not  drive yourself to the hospital. Summary  Anemia is a condition in which you do not have enough red blood cells or enough of a substance in your red blood cells that carries oxygen (hemoglobin).  Symptoms may occur suddenly or develop slowly.  If your anemia is mild, you may not have symptoms.  This condition is  diagnosed with blood tests, a medical history, and a physical exam. Other tests may be needed.  Treatment for this condition depends on the cause of the anemia. This information is not intended to replace advice given to you by your health care provider. Make sure you discuss any questions you have with your health care provider. Document Revised: 07/26/2019 Document Reviewed: 07/26/2019 Elsevier Patient Education  2021 Elsevier Inc.  

## 2020-10-19 ENCOUNTER — Telehealth: Payer: Self-pay | Admitting: Cardiovascular Disease

## 2020-10-19 ENCOUNTER — Telehealth (INDEPENDENT_AMBULATORY_CARE_PROVIDER_SITE_OTHER): Payer: Self-pay | Admitting: Primary Care

## 2020-10-19 LAB — MICROALBUMIN / CREATININE URINE RATIO
Creatinine, Urine: 66.1 mg/dL
Microalb/Creat Ratio: 405 mg/g creat — ABNORMAL HIGH (ref 0–29)
Microalbumin, Urine: 267.6 ug/mL

## 2020-10-19 NOTE — Telephone Encounter (Signed)
Sent to PCP ?

## 2020-10-19 NOTE — Telephone Encounter (Signed)
Pt c/o of Chest Pain: STAT if CP now or developed within 24 hours  1. Are you having CP right now? Not right now   2. Are you experiencing any other symptoms (ex. SOB, nausea, vomiting, sweating)? PT is having a eye headache behind his left eye.  When he up moving around he gets a litte SOB,  He said he feels like he can hear his heart beat   3. How long have you been experiencing CP? Over a week ago it started   4. Is your CP continuous or coming and going? Coming and going   5. Have you taken Nitroglycerin? No   Best number [phone] ?

## 2020-10-19 NOTE — Telephone Encounter (Signed)
Pt does not want the regular meter to check his bs. Pt is requesting the meter that attaches to his arm and he does know the name of it. Pt also need the supplies that come with device. [facility] phone [phone]. Pt would like to check his bs three times a day

## 2020-10-19 NOTE — Telephone Encounter (Signed)
Returned call to patient who states that he has been having intermittent chest pain for the last week. Patient states that the pain comes on with exertion and eases off when he stops to take a breath. Patient states the pain does not radiate any where and the pain feels like "he can feel his heart beating" and states it is not sharp. Patient states that the pain does not last long and comes and goes. Patient states he is not actively having chest pain at this time. Patient reports shortness of breath with exertion, and a head ache behind his right eye. Per patient his recent BP at PCP yesterday was 96/58 and HR 58. Patient also reports not being able to check his blood sugar and does not have the equipment needed to do so. Advised patient to reach out to his PCP to obtain blood sugar monitoring supplies. Made patient an appointment for 2/21 at 3:15pm with Fabian Sharp PA-C to address issues and concerns. Advised patient that should Chest pain or symptoms worsen he should go to the ED for evaluation. Patient verbalized understanding. Will forward to MD as well.

## 2020-10-20 ENCOUNTER — Telehealth: Payer: Self-pay | Admitting: Family Medicine

## 2020-10-20 NOTE — Telephone Encounter (Signed)
Received call from after-hours service stating patient complains of chest pain, dyspnea. Advised to call 911 but he declines.He would rather wait to see his Cardiologist on Monday. He is also requesting freestyle libre testing supplies.

## 2020-10-21 ENCOUNTER — Other Ambulatory Visit (INDEPENDENT_AMBULATORY_CARE_PROVIDER_SITE_OTHER): Payer: Self-pay | Admitting: Primary Care

## 2020-10-21 DIAGNOSIS — E1165 Type 2 diabetes mellitus with hyperglycemia: Secondary | ICD-10-CM

## 2020-10-21 MED ORDER — FREESTYLE TEST VI STRP: ORAL_STRIP | 12 refills | Status: AC

## 2020-10-21 NOTE — Progress Notes (Addendum)
Cardiology Office Note:    Date:  11/01/2020   ID:  Earl Calderon, DOB 1951/01/26, MRN [id number]  PCP:  Kerin Perna, NP  Cardiologist:  [name], MD   Referring MD: Kerin Perna, NP   Chief Complaint  Patient presents with  . Follow-up  CHF, CAD  History of Present Illness:    Earl Calderon is a 70 y.o. male with a hx of hypertension, chronic systolic and diastolic heart failure, DM2, CKD stage III, hx of stroke, HLD, CAD with prior PCI. He was initially seen in March 2020 with new onset CHF. Echo showed EF 25-30% and right and left heart cath was performed. A 99% lesion in the mid LAD was treated with DES. There was suspicion for cardiac amyloidosis at that time. Medical therapy was titrated and he was discharged later that month. He did not follow up with cardiology and presented back to the ER 07/2020 with CHF and stroke. He had not taken any cardiac medications in over a year citing that he wanted to detox his body. Repeat echo at that time showed EF 30-35% with global hypokinesis and LVH. PYP scan during that hospitalization was not suggestive of transthyretin amyloidosis. He was discharged with coreg, entresto, spiro, bidil, and farxiga. Unfortunately, he was unable to get spiro or bidil from his pharmacy. He was last seen in clinic by Dr. Oval Linsey 07/24/20. At that time, he did not have a BP cuff and was not weighing himself. BP was well-controlled at that visit without the spiro and bidil, these were not re-prescribed. He purchased a BP cuff.  He was on the dry side and lasix was reduced to daily.   I saw him for follow up on 09/26/20. He did not have a PCP and complained of dizziness and fatigue. His BP running 140/90s. He reports that he has trouble getting to appts, so I opted to add imdur at night for better pressure control. This will not require additional labs for imdur.  Labs showed stable renal function, but worsening anemia. I encouraged him to  follow up with a PCP, which happened on 10/18/20. BP at that visit was 96/58.  He called our office 10/19/20 with complaints of chest pain and DOE, declined ER evaluation, and was added to my schedule today.   He presents for follow up. He has established with [facility]. He continues to be fatigued and feels weak. He appears euvolemic and BP is well-controlled. He walks with a cane, but does fatigue easily when walking. He denies chest pain today. He is doing well with imdur. His vision is blurry in the morning and is sluggish in the morning. Question if he is continuing to adjust to a normalized BP.  He did wake up thurs with chest pain related to stress of not receiving a glucose meter. He still tries to exercise. He is frustrated that he does not wake up rejuvenated, but feels sluggish and tired. He snores on occasion. He also stays up late at night listening to preachers at [facility]. We discussed that we could try to reduce his BB, but he is on a very good regimen, controlled BP, and is euvolemic. He agrees to continues to monitor for now. I also ask if he would like to try cardiac rehab, he wants to wait and work on walking at home by himself.    Past Medical History:  Diagnosis Date  . CKD (chronic kidney disease), stage III ([place]) 08/24/2020  . Coronary  artery disease   . Diabetes mellitus without complication ([place])   . Hernia, inguinal   . Hypertension   . NSVT (nonsustained ventricular tachycardia) ([place]) 08/24/2020  . Uncontrolled type 2 diabetes mellitus ([place]) 11/11/2018    Past Surgical History:  Procedure Laterality Date  . CORONARY STENT INTERVENTION N/A 11/15/2018   Procedure: CORONARY STENT INTERVENTION;  Surgeon: [name], MD;  Location: [facility];  Service: Cardiovascular;  Laterality: N/A;  . HEMORROIDECTOMY    . RIGHT HEART CATH N/A 07/18/2020   Procedure: RIGHT HEART CATH;  Surgeon: [name], MD;  Location: [facility];   Service: Cardiovascular;  Laterality: N/A;  . RIGHT/LEFT HEART CATH AND CORONARY ANGIOGRAPHY N/A 11/15/2018   Procedure: RIGHT/LEFT HEART CATH AND CORONARY ANGIOGRAPHY;  Surgeon: [name], MD;  Location: [facility];  Service: Cardiovascular;  Laterality: N/A;    Current Medications: Current Meds  Medication Sig  . aspirin EC 81 MG tablet Take 1 tablet (81 mg total) by mouth daily. Swallow whole.  . dapagliflozin propanediol (FARXIGA) 10 MG TABS tablet Take 1 tablet (10 mg total) by mouth daily before breakfast.  . ferrous sulfate 325 (65 FE) MG tablet Take 1 tablet (325 mg total) by mouth daily with breakfast.  . furosemide (LASIX) 40 MG tablet Take 1 tablet (40 mg total) by mouth daily.  Marland Kitchen glucose blood (FREESTYLE TEST STRIPS) test strip Use as instructed  . insulin aspart (NOVOLOG FLEXPEN) 100 UNIT/ML FlexPen Before each meal 3 times a day, 140-199 - 2 units, 200-250 - 4 units, 251-299 - 6 units,  300-349 - 8 units,  350 or above 10 units. Insulin PEN if approved, provide syringes and needles if needed.  . isosorbide mononitrate (IMDUR) 30 MG 24 hr tablet Take 1 tablet (30 mg total) by mouth daily. At Night  . LEVEMIR FLEXTOUCH 100 UNIT/ML FlexPen Inject into the skin.  Marland Kitchen nitroGLYCERIN (NITROSTAT) 0.4 MG SL tablet Place 1 tablet (0.4 mg total) under the tongue every 5 (five) minutes as needed for chest pain.  Marland Kitchen NOVOFINE PEN NEEDLE 32G X 6 MM MISC SMARTSIG:Syringe(s) SUB-Q  . sacubitril-valsartan (ENTRESTO) 97-103 MG Take 1 tablet by mouth 2 (two) times daily.     Allergies:   Patient has no known allergies.   Social History   Socioeconomic History  . Marital status: Married    Spouse name: Not on file  . Number of children: Not on file  . Years of education: Not on file  . Highest education level: Not on file  Occupational History  . Not on file  Tobacco Use  . Smoking status: Never Smoker  . Smokeless tobacco: Never Used  Vaping Use  . Vaping Use: Never used   Substance and Sexual Activity  . Alcohol use: Never  . Drug use: Never  . Sexual activity: Not on file  Other Topics Concern  . Not on file  Social History Narrative  . Not on file   Social Determinants of Health   Financial Resource Strain: Medium Risk  . Difficulty of Paying Living Expenses: Somewhat hard  Food Insecurity: No Food Insecurity  . Worried About Charity fundraiser in the Last Year: Never true  . Ran Out of Food in the Last Year: Never true  Transportation Needs: Unmet Transportation Needs  . Lack of Transportation (Medical): Yes  . Lack of Transportation (Non-Medical): Yes  Physical Activity: Not on file  Stress: Not on file  Social Connections:  Not on file     Family History: The patient's family history includes Arrhythmia in his mother; Diabetes Mellitus II in his mother and another family member; Heart disease in his mother; Hypertension in his brother, mother, sister, and another family member.  ROS:   Please see the history of present illness.     All other systems reviewed and are negative.  EKGs/Labs/Other Studies Reviewed:    The following studies were reviewed today:  Right heart cath 2021 Mildly elevated filling pressures, mild to moderate pulmonary hypertension and mildly reduced cardiac output.  RA: 8 mmHg RV: 53/3 mmHg PCW 15 mmHg PA: 50/17 mmHg with a mean of 29  AO sat 97% PA sat 60% Cardiac output: 3.92 with a cardiac index of 2.15.  Recommendations: Hemodynamics appear reasonable.  Continue oral furosemide and heart failure medications.    EKG:  EKG is  ordered today.  The ekg ordered today demonstrates sinus bradycardia HR 58, LBBB (old)  Recent Labs: 07/11/2020: ALT 11 07/17/2020: B Natriuretic Peptide 241.4 07/19/2020: Magnesium 2.1 09/26/2020: BUN 24; Creatinine, Ser 1.12; Hemoglobin 11.0; Platelets 179; Potassium 4.2; Sodium 142; TSH 1.540  Recent Lipid Panel    Component Value Date/Time   CHOL 146 07/12/2020  0510   TRIG 66 07/12/2020 0510   HDL 41 07/12/2020 0510   CHOLHDL 3.6 07/12/2020 0510   VLDL 13 07/12/2020 0510   LDLCALC 92 07/12/2020 0510    Physical Exam:    VS:  BP 124/76   Pulse (!) 58   Ht 5\' 10"  (1.778 m)   Wt 149 lb (67.6 kg)   SpO2 99%   BMI 21.38 kg/m     Wt Readings from Last 3 Encounters:  10/22/20 149 lb (67.6 kg)  10/18/20 152 lb 6.4 oz (69.1 kg)  09/26/20 150 lb (68 kg)     GEN: Well nourished, well developed in no acute distress HEENT: Normal NECK: No JVD; No carotid bruits LYMPHATICS: No lymphadenopathy CARDIAC: RRR, no murmurs, rubs, gallops RESPIRATORY:  Clear to auscultation without rales, wheezing or rhonchi  ABDOMEN: Soft, non-tender, non-distended MUSCULOSKELETAL:  No edema; No deformity  SKIN: Warm and dry NEUROLOGIC:  Alert and oriented x 3 PSYCHIATRIC:  Normal affect   ASSESSMENT:    1. Chest pain of uncertain etiology   2. Coronary artery disease involving native coronary artery of native heart with unstable angina pectoris ([place])   3. Chronic combined systolic and diastolic heart failure ([place])   4. Hyperlipidemia with target LDL less than 70   5. Primary hypertension   6. Stage 3a chronic kidney disease ([place])   7. Uncontrolled type 2 diabetes mellitus with hyperglycemia ([facility])    PLAN:    In order of problems listed above:  Chest pain - occurred after starting imdur - question if this was related to stress - he was very upset at not receiving his glucose monitor and then experienced chest pain - EKG nonischemic, reviewed angiography with residual disease - have provided him with a prescription for nitro PRN - continue to monitor   Chronic systolic and diastolic heart failure - maintained on coreg, entrestor, farxiga, lasix, and imdur - appears euvolemic   CAD s/p DES to LAD (10/2018) - continue ASA and 40 mg crestor - one bout of chest pain --> will give nitro PRN   Hyperlipidemia with LDL goal < 70 07/12/2020:  Cholesterol 146; HDL 41; LDL Cholesterol 92; Triglycerides 66; VLDL 13 - is not fasting today - continue 40 mg crestor  Hypertension - medications as above - he  has a BP cuff at home - well controlled with addition of imdur   CKD stage III - 1.29 (0.99) 07/24/20 --> 1.12 09/26/20 - he was instructed to repeat BMP in 1 month, but this did not happen - collect BMP today   DM - restarted on insulin at last visit - A1c 9.2% --> improved to 7.7% - congratulated him on his compliance - he did not receive his glucose meter - will reach out to [facility]  Follow up with Dr. Oval Linsey in 3 months.   ADDENDUM 11/01/20: Social history has been updated to "married status."  I have updated my note to reflect that he has a BP cuff at home, this was not given to them by our office, per the patient.  I have updated my note to remove "noncompliance" from my note.    Medication Adjustments/Labs and Tests Ordered: Current medicines are reviewed at length with the patient today.  Concerns regarding medicines are outlined above.  Orders Placed This Encounter  Procedures  . EKG 12-Lead   Meds ordered this encounter  Medications  . nitroGLYCERIN (NITROSTAT) 0.4 MG SL tablet    Sig: Place 1 tablet (0.4 mg total) under the tongue every 5 (five) minutes as needed for chest pain.    Dispense:  25 tablet    Refill:  3    Signed, Ledora Bottcher, [place]  11/01/2020 8:39 AM    [facility]

## 2020-10-22 ENCOUNTER — Ambulatory Visit (INDEPENDENT_AMBULATORY_CARE_PROVIDER_SITE_OTHER): Payer: Medicare Other | Admitting: Physician Assistant

## 2020-10-22 ENCOUNTER — Other Ambulatory Visit: Payer: Self-pay

## 2020-10-22 ENCOUNTER — Encounter: Payer: Self-pay | Admitting: Physician Assistant

## 2020-10-22 VITALS — BP 124/76 | HR 58 | Ht 70.0 in | Wt 149.0 lb

## 2020-10-22 DIAGNOSIS — N1831 Chronic kidney disease, stage 3a: Secondary | ICD-10-CM

## 2020-10-22 DIAGNOSIS — I25110 Atherosclerotic heart disease of native coronary artery with unstable angina pectoris: Secondary | ICD-10-CM

## 2020-10-22 DIAGNOSIS — I5042 Chronic combined systolic (congestive) and diastolic (congestive) heart failure: Secondary | ICD-10-CM

## 2020-10-22 DIAGNOSIS — I10 Essential (primary) hypertension: Secondary | ICD-10-CM

## 2020-10-22 DIAGNOSIS — E785 Hyperlipidemia, unspecified: Secondary | ICD-10-CM

## 2020-10-22 DIAGNOSIS — R079 Chest pain, unspecified: Secondary | ICD-10-CM | POA: Diagnosis not present

## 2020-10-22 DIAGNOSIS — E1165 Type 2 diabetes mellitus with hyperglycemia: Secondary | ICD-10-CM

## 2020-10-22 MED ORDER — NITROGLYCERIN 0.4 MG SL SUBL: 0.4000 mg | SUBLINGUAL_TABLET | SUBLINGUAL | 3 refills | Status: AC | PRN

## 2020-10-22 NOTE — Patient Instructions (Signed)
Medication Instructions:  Start Nitroglycerin 0.4 mg ( 1 tablet under the tongue for chest pain as directed) *If you need a refill on your cardiac medications before your next appointment, please call your pharmacy*   Lab Work: No Labs If you have labs (blood work) drawn today and your tests are completely normal, you will receive your results only by: Marland Kitchen MyChart Message (if you have MyChart) OR . A paper copy in the mail If you have any lab test that is abnormal or we need to change your treatment, we will call you to review the results.   Testing/Procedures: No Testing   Follow-Up: At [facility], you and your health needs are our priority.  As part of our continuing mission to provide you with exceptional heart care, we have created designated Provider Care Teams.  These Care Teams include your primary Cardiologist (physician) and Advanced Practice Providers (APPs -  Physician Assistants and Nurse Practitioners) who all work together to provide you with the care you need, when you need it.    Your next appointment:   3 month(s)  The format for your next appointment:   In Person  Provider:   [name], MD

## 2020-10-27 ENCOUNTER — Encounter (INDEPENDENT_AMBULATORY_CARE_PROVIDER_SITE_OTHER): Payer: Self-pay | Admitting: Primary Care

## 2020-10-30 ENCOUNTER — Telehealth: Payer: Self-pay | Admitting: *Deleted

## 2020-10-30 NOTE — Telephone Encounter (Signed)
Received below mychart message   Earl Calderon, Earl Calderon "[name]"  [name], MD 58 minutes ago (2:40 PM)   AT [place] [place] lying on the floor twice today, told me due to dizziness.   Left message to call back and sent mychart message

## 2020-10-31 ENCOUNTER — Other Ambulatory Visit: Payer: Self-pay | Admitting: Cardiovascular Disease

## 2020-11-01 ENCOUNTER — Encounter: Payer: Self-pay | Admitting: *Deleted

## 2020-11-01 NOTE — Telephone Encounter (Signed)
Did not hear back from patient after leaving message to call and sending mychart message both on 3/1. Left another message to call today, no response.  Mychart message sent to patient if he continues to have dizziness and chest pain needs to go to ED for evaluation

## 2020-11-01 NOTE — Telephone Encounter (Signed)
Left message to call back  

## 2020-11-13 ENCOUNTER — Encounter: Payer: Self-pay | Admitting: Nurse Practitioner

## 2020-11-15 ENCOUNTER — Encounter: Payer: Self-pay | Admitting: Nurse Practitioner

## 2020-11-15 ENCOUNTER — Encounter (INDEPENDENT_AMBULATORY_CARE_PROVIDER_SITE_OTHER): Payer: Self-pay | Admitting: Primary Care

## 2020-12-24 ENCOUNTER — Other Ambulatory Visit: Payer: Self-pay | Admitting: Cardiovascular Disease

## 2020-12-25 ENCOUNTER — Telehealth (INDEPENDENT_AMBULATORY_CARE_PROVIDER_SITE_OTHER): Payer: Self-pay | Admitting: Primary Care

## 2020-12-25 ENCOUNTER — Telehealth: Payer: Self-pay | Admitting: Licensed Clinical Social Worker

## 2020-12-25 NOTE — Telephone Encounter (Signed)
Received request for ride for pt to get to [facility] from [facility], it appears both [facility] and our providers here at [facility] have made recommendation for pt to have his vision checked. I spoke with [employer] and requested they assist with ride under our cost center as our providers had made that recommendation. Darrick Meigs, Solicitor has made note and they will try and make the trip fit into their driver's schedule and let pt know.   Westley Hummer, MSW, [place]  [phone]

## 2020-12-25 NOTE — Telephone Encounter (Signed)
Copied from [place] [id number]. Topic: General - Other >> Dec 25, 2020 10:38 AM Leward Quan A wrote: Reason for CRM: Mo with [employer] calling to speak to Juluis Mire to acquire approval for patient to get his ride to [facility]. Patient is scheduled for 12/26/20  Please call Ph# [phone] ask for Mo

## 2020-12-26 NOTE — Telephone Encounter (Signed)
Called patient and left VM. Advised patient I was calling regarding transportation and apologized about the late follow up, it was my mistake I thought his appointment was tomorrow. Advised him to call us back with any questions or concerns. If patient returns phone call please remind him that since he has Medicaid he should set up Medicaid transportation for future visits.

## 2020-12-26 NOTE — Telephone Encounter (Signed)
Looks like patient is transferring care from [place] to [place]. Is it okay to provide [facility] cost center to transportation? Or should we provide [place] or is is not appropriate to provide authorization?

## 2020-12-28 ENCOUNTER — Other Ambulatory Visit: Payer: Self-pay | Admitting: Cardiovascular Disease

## 2020-12-28 ENCOUNTER — Encounter: Payer: Self-pay | Admitting: *Deleted

## 2020-12-28 ENCOUNTER — Other Ambulatory Visit: Payer: Self-pay | Admitting: Nurse Practitioner

## 2020-12-28 NOTE — Telephone Encounter (Signed)
Patient has no PCP Discussed with Dr Oval Linsey and ok to fill x1 only but must get further refills from PCP  Mychart message sent to patient

## 2020-12-28 NOTE — Telephone Encounter (Signed)
Patient misplaced his insulin, LEVEMIR FLEXTOUCH 100 UNIT/ML FlexPen and would like a new rx, please advise when a new rx is sent in.   [facility] [place] - [address] Phone:  [phone]  Fax:  [phone]

## 2020-12-28 NOTE — Telephone Encounter (Signed)
Requested medication (s) are due for refill today: Yes  Requested medication (s) are on the active medication list: Yes  Last refill:  09/26/20  Future visit scheduled: Yes  Notes to clinic:  Historical provider.    Requested Prescriptions  Pending Prescriptions Disp Refills   LEVEMIR FLEXTOUCH 100 UNIT/ML FlexPen 15 mL     Sig: Inject into the skin.      Endocrinology:  Diabetes - Insulins Passed - 12/28/2020  2:13 PM      Passed - HBA1C is between 0 and 7.9 and within 180 days    Hemoglobin A1C  Date Value Ref Range Status  10/18/2020 7.7 (A) 4.0 - 5.6 % Final   Hgb A1c MFr Bld  Date Value Ref Range Status  07/10/2020 9.2 (H) 4.8 - 5.6 % Final    Comment:    (NOTE) Pre diabetes:          5.7%-6.4%  Diabetes:              >6.4%  Glycemic control for   <7.0% adults with diabetes           Passed - Valid encounter within last 6 months    Recent Outpatient Visits           2 months ago Uncontrolled type 2 diabetes mellitus with hyperglycemia ([place])   [place], [place], NP       Future Appointments             In 4 days [name], MD [facility] [place], [facility]   In 1 week Gildardo Pounds, NP [place]   In 3 weeks [name], MD [facility] [place], [facility]

## 2020-12-28 NOTE — Telephone Encounter (Signed)
Discussed with Dr Oval Linsey and she will fill x 1 only, patient needs PCP  Mychart message sent to patient

## 2020-12-31 ENCOUNTER — Telehealth: Payer: Self-pay

## 2020-12-31 NOTE — Telephone Encounter (Signed)
Copied from [place] [phone]. Topic: General - Other >> Dec 28, 2020  1:44 PM Oneta Rack wrote: Patient requesting [facility] Transportation orders for [facility] Dr. Iona Hansen appointment scheduled on 01/07/221. Please follow up with patient when orders are placed. Patient is a patient of Geryl Rankins and would like chart updated. >> Dec 28, 2020  1:54 PM Oneta Rack wrote: Please call [phone] when orders are placed.

## 2020-12-31 NOTE — Telephone Encounter (Signed)
Please contact pt and make aware to contact Medicaid Transportation to schedule transportation

## 2020-12-31 NOTE — Telephone Encounter (Signed)
Called patient and LVM advising patient to contact medicaid transportation for transportation for patients. Advised patient to call [phone] with any further questions or concerns regarding transportation.

## 2021-01-01 ENCOUNTER — Other Ambulatory Visit: Payer: Self-pay

## 2021-01-01 ENCOUNTER — Encounter: Payer: Self-pay | Admitting: Cardiovascular Disease

## 2021-01-01 ENCOUNTER — Ambulatory Visit (INDEPENDENT_AMBULATORY_CARE_PROVIDER_SITE_OTHER): Payer: Medicare Other | Admitting: Cardiovascular Disease

## 2021-01-01 VITALS — BP 154/74 | HR 57 | Ht 70.0 in | Wt 150.0 lb

## 2021-01-01 DIAGNOSIS — I472 Ventricular tachycardia: Secondary | ICD-10-CM

## 2021-01-01 DIAGNOSIS — I2510 Atherosclerotic heart disease of native coronary artery without angina pectoris: Secondary | ICD-10-CM

## 2021-01-01 DIAGNOSIS — Z5181 Encounter for therapeutic drug level monitoring: Secondary | ICD-10-CM | POA: Diagnosis not present

## 2021-01-01 DIAGNOSIS — I10 Essential (primary) hypertension: Secondary | ICD-10-CM | POA: Diagnosis not present

## 2021-01-01 DIAGNOSIS — I4729 Other ventricular tachycardia: Secondary | ICD-10-CM

## 2021-01-01 DIAGNOSIS — I5043 Acute on chronic combined systolic (congestive) and diastolic (congestive) heart failure: Secondary | ICD-10-CM

## 2021-01-01 DIAGNOSIS — N1831 Chronic kidney disease, stage 3a: Secondary | ICD-10-CM | POA: Diagnosis not present

## 2021-01-01 HISTORY — DX: Atherosclerotic heart disease of native coronary artery without angina pectoris: I25.10

## 2021-01-01 MED ORDER — SPIRONOLACTONE 25 MG PO TABS: 25.0000 mg | ORAL_TABLET | Freq: Every day | ORAL | 3 refills | Status: DC

## 2021-01-01 NOTE — Progress Notes (Signed)
Cardiology Office Note   Date:  01/01/2021   ID:  Earl Calderon, DOB [birth year], MRN [id number]  PCP:  Kerin Perna, NP  Cardiologist:   [name], MD   Chief Complaint  Patient presents with  . Follow-up    3 months.  . Shortness of Breath  . Fatigue     History of Present Illness: [name] is a 30M with chronic systolic and diastolic heart failure, prior stroke, diabetes, hypertension, hyperlipidemia, CAD status post PCI and nonadherence admitted with acute on chronic systolic and diastolic heart failure in the setting of not taking his medications.  He was admitted with volume overload and dyspnea.  He wanted to "remove all chemicals from his body" and decided to stop his medications.  Echo revealed LVEF 30-35% with global hypokinesis and moderate LVH.  BP was poorly controlled.  There was concern for cardiac amyloidosis.  PYP scan during that hospitalization was not suggestive of ATTR amyloidosis.  He was started on carvedilol, Entresto, spironolactone, BiDil, and for [place].  Prior to discharge he had a right heart cath with radial pressure of 8, RV 53/3, pulmonary capillary wedge 15.  Mean PA pressure was 29 mmHg.  After discharge he was unable to get spironolactone or BiDil from the pharmacy.  [name] was first seen by cardiology 10/2018 at which time he was hospitalized and found to have a newly reduced LVEF to 25 to 30%.  EKG revealed left bundle branch block.  He underwent right and left heart cath 10/2018 and was found to have a 90% mid LAD lesion that was successfully stented with drug-eluting stent.  He did not follow-up after that time.  He was seen 07/2020 and he was not taking spirolactone or vidl, however they were not started because his blood pressure was low and he was fatigued. His Lasix was decreased given he was on farxiga. He followed up with [name] and Imdur was added. He then felt fatigued and weak and was seen on 10/2020 for chest  pain.  Today, he is feeling fairly well. His eyesight is worsening, and is currently working with a specialist. He reports that he no longer has the pounding in his head. After walking he can be mildly fatigued with some shortness of breath, but attributes this to normal exertional fatigue and states that he can still easily catch his breath.  He normally eats breakfast around 11-12 am. At this time, he is unsure of the condition of his blood pressure at home. He denies any chest pain or palpitations. No syncope or lightheadedness/dizziness to note. Also has no lower extremity edema, orthopnea or PND. On his right second finger he has a large lesion.   Past Medical History:  Diagnosis Date  . CAD in native artery 01/01/2021   Prior LAD PCI.  Currently no symptoms of ischemia.  Encouraged him to do some light exercising.  Continue aspirin, carvedilol, and rosuvastatin.  . CKD (chronic kidney disease), stage III ([place]) 08/24/2020  . Coronary artery disease   . Diabetes mellitus without complication ([place])   . Hernia, inguinal   . Hypertension   . NSVT (nonsustained ventricular tachycardia) ([place]) 08/24/2020  . Uncontrolled type 2 diabetes mellitus ([place]) 11/11/2018    Past Surgical History:  Procedure Laterality Date  . CORONARY STENT INTERVENTION N/A 11/15/2018   Procedure: CORONARY STENT INTERVENTION;  Surgeon: [name], MD;  Location: [facility];  Service: Cardiovascular;  Laterality: N/A;  . HEMORROIDECTOMY    .  RIGHT HEART CATH N/A 07/18/2020   Procedure: RIGHT HEART CATH;  Surgeon: [name], MD;  Location: [facility];  Service: Cardiovascular;  Laterality: N/A;  . RIGHT/LEFT HEART CATH AND CORONARY ANGIOGRAPHY N/A 11/15/2018   Procedure: RIGHT/LEFT HEART CATH AND CORONARY ANGIOGRAPHY;  Surgeon: [name], MD;  Location: [facility];  Service: Cardiovascular;  Laterality: N/A;     Current Outpatient Medications  Medication Sig Dispense  Refill  . aspirin EC 81 MG tablet Take 1 tablet (81 mg total) by mouth daily. Swallow whole. 90 tablet 3  . carvedilol (COREG) 25 MG tablet Take 1 tablet (25 mg total) by mouth 2 (two) times daily with a meal. 180 tablet 3  . dapagliflozin propanediol (FARXIGA) 10 MG TABS tablet Take 1 tablet (10 mg total) by mouth daily before breakfast. 90 tablet 1  . ferrous sulfate 325 (65 FE) MG tablet Take 1 tablet (325 mg total) by mouth daily with breakfast. 30 tablet 3  . furosemide (LASIX) 40 MG tablet Take 1 tablet (40 mg total) by mouth daily. 90 tablet 3  . glucose blood (FREESTYLE TEST STRIPS) test strip Use as instructed 100 each 12  . insulin aspart (NOVOLOG FLEXPEN) 100 UNIT/ML FlexPen Before each meal 3 times a day, 140-199 - 2 units, 200-250 - 4 units, 251-299 - 6 units,  300-349 - 8 units,  350 or above 10 units. Insulin PEN if approved, provide syringes and needles if needed. 15 mL 2  . insulin detemir (LEVEMIR FLEXTOUCH) 100 UNIT/ML FlexPen INJECT 8 UNITS INTO THE SKIN TWICE A DAY NO MORE REFILLS FROM DR [name], MUST GET FROM PRIMARY CARE 15 mL 0  . Insulin Pen Needle (NOVOFINE PEN NEEDLE) 32G X 6 MM MISC AS DIRECTED NO MORE REFILLS FROM DR [name], MUST GET FROM PRIMARY CARE 100 each 0  . nitroGLYCERIN (NITROSTAT) 0.4 MG SL tablet Place 1 tablet (0.4 mg total) under the tongue every 5 (five) minutes as needed for chest pain. 25 tablet 3  . rosuvastatin (CRESTOR) 40 MG tablet Take 1 tablet (40 mg total) by mouth daily at 6 PM. 90 tablet 3  . sacubitril-valsartan (ENTRESTO) 97-103 MG Take 1 tablet by mouth 2 (two) times daily. 180 tablet 3  . spironolactone (ALDACTONE) 25 MG tablet Take 1 tablet (25 mg total) by mouth daily. 90 tablet 3   No current facility-administered medications for this visit.    Allergies:   Patient has no known allergies.    Social History:  The patient  reports that he has never smoked. He has never used smokeless tobacco. He reports that he does not drink alcohol  and does not use drugs.   Family History:  The patient's family history includes Arrhythmia in his mother; Diabetes Mellitus II in his mother and another family member; Heart disease in his mother; Hypertension in his brother, mother, sister, and another family member.    ROS:   Please see the history of present illness. (+) Fatigue (+) Shortness of breath (+) Lesion, right second finger All other systems are reviewed and negative.    PHYSICAL EXAM: VS:  BP (!) 154/74   Pulse (!) 57   Ht 5\' 10"  (1.778 m)   Wt 150 lb (68 kg)   BMI 21.52 kg/m  , BMI Body mass index is 21.52 kg/m. GENERAL:  Well appearing HEENT:  Pupils equal round and reactive, fundi not visualized, oral mucosa unremarkable NECK:  No jugular venous distention, waveform within normal  limits, carotid upstroke brisk and symmetric, no bruits, no thyromegaly LYMPHATICS:  No cervical adenopathy LUNGS:  Clear to auscultation bilaterally HEART:  RRR.  PMI not displaced or sustained,S1 and S2 within normal limits, no S3, no S4, no clicks, no rubs, no murmurs ABD:  Flat, positive bowel sounds normal in frequency in pitch, no bruits, no rebound, no guarding, no midline pulsatile mass, no hepatomegaly, no splenomegaly EXT:  2 plus pulses throughout, trace edema, no cyanosis no clubbing SKIN:  No rashes no nodules NEURO:  Cranial nerves II through XII grossly intact, motor grossly intact throughout PSYCH:  Cognitively intact, oriented to person place and time   EKG:   07/24/2020: EKG was not ordered. 01/01/2021: EKG is not ordered today.   Echo 07/10/20:  1. Left ventricular ejection fraction, by estimation, is 30 to 35%. The  left ventricle has moderately decreased function. The left ventricle  demonstrates global hypokinesis. There is moderate concentric left  ventricular hypertrophy. Left ventricular  diastolic parameters are consistent with Grade II diastolic dysfunction  (pseudonormalization). Elevated left atrial  pressure.  2. Right ventricular systolic function is moderately reduced. The right  ventricular size is mildly enlarged. Mildly increased right ventricular  wall thickness. There is moderately elevated pulmonary artery systolic  pressure. The estimated right  ventricular systolic pressure is 53.4 mmHg.  3. Left atrial size was severely dilated.  4. Right atrial size was moderately dilated.  5. The mitral valve is normal in structure. Mild mitral valve  regurgitation.  6. Tricuspid valve regurgitation is moderate.  7. The aortic valve is tricuspid. Aortic valve regurgitation is not  visualized. No aortic stenosis is present.  8. The inferior vena cava is dilated in size with <50% respiratory  variability, suggesting right atrial pressure of 15 mmHg.   RHC 07/18/20: Mildly elevated filling pressures, mild to moderate pulmonary hypertension and mildly reduced cardiac output.  RA: 8 mmHg RV: 53/3 mmHg PCW 15 mmHg PA: 50/17 mmHg with a mean of 29  AO sat 97% PA sat 60% Cardiac output: 3.92 with a cardiac index of 2.15.  LHC 11/15/18:   Prox LAD to Mid LAD lesion is 40% stenosed.  Mid Cx to Dist Cx lesion is 25% stenosed.  Ost 2nd Diag to 2nd Diag lesion is 50% stenosed.  Ost 3rd Mrg lesion is 25% stenosed.  Mid LAD lesion is 90% stenosed.  A drug-eluting stent was successfully placed using a STENT SYNERGY DES 2.5X16.  Post intervention, there is a 0% residual stenosis.  LV end diastolic pressure is normal.  There is no aortic valve stenosis.  Ao 92%, PA 66%, mean PA 17 mm Hg, mean PCWP 3 mm Hg, CO 5.1; CI 2.8- normal right heart pressures  Tortuous right subclavian. 3DRC was used for RCA.   Continue aggressive secondary prevention in addition to dual antiplatelet therapy.  Adequate diuresis based on right heart pressures.  Continue medical therapy for heart failure.   Recent Labs: 07/11/2020: ALT 11 07/17/2020: B Natriuretic Peptide 241.4 07/19/2020:  Magnesium 2.1 09/26/2020: BUN 24; Creatinine, Ser 1.12; Hemoglobin 11.0; Platelets 179; Potassium 4.2; Sodium 142; TSH 1.540    Lipid Panel    Component Value Date/Time   CHOL 146 07/12/2020 0510   TRIG 66 07/12/2020 0510   HDL 41 07/12/2020 0510   CHOLHDL 3.6 07/12/2020 0510   VLDL 13 07/12/2020 0510   LDLCALC 92 07/12/2020 0510      Wt Readings from Last 3 Encounters:  01/01/21 150 lb (68 kg)  10/22/20 149 lb (67.6 kg)  10/18/20 152 lb 6.4 oz (69.1 kg)      ASSESSMENT AND PLAN: Acute on chronic combined systolic (congestive) and diastolic (congestive) heart failure ([place]) He is doing well clinically.  Continue carvedilol, [place], and [place].  He is not taking Imdur.  We will add spironolactone 25 mg daily given that his blood pressure is elevated today.  Repeat BMP in a week.  We will recheck an echo in 2 months to assess his LVEF.  If it remains less than 35%, he will need consideration for an ICD.  Hypertension Blood pressure poorly controlled today.  We will add spironolactone 25 mg.  Continue carvedilol and Entresto.  CKD (chronic kidney disease), stage III (HCC) Renal function stable.  Repeating basic metabolic panel in a week given that we are adding spironolactone.  CAD in native artery Prior LAD PCI.  Currently no symptoms of ischemia.  Encouraged him to do some light exercising.  Continue aspirin, carvedilol, and rosuvastatin.  NSVT (nonsustained ventricular tachycardia) (H[facility]) Continue carvedilol.   Current medicines are reviewed at length with the patient today.  The patient does not have concerns regarding medicines.  The following changes have been made:  Spironolactone and Bidil removed from list.  Reduced lasix to daily.   Labs/ tests ordered today include:   Orders Placed This Encounter  Procedures  . Basic metabolic panel  . ECHOCARDIOGRAM COMPLETE    Disposition:   FU with Ernestyne Caldwell C. Oval Linsey, MD, [facility] in 2-3 months.     I,Mathew  Stumpf,acting as a Education administrator for [name], MD.,have documented all relevant documentation on the behalf of [name], MD,as directed by  [name], MD while in the presence of [name], MD.  I, [place] Oval Linsey, MD have reviewed all documentation for this visit.  The documentation of the exam, diagnosis, procedures, and orders on 01/01/2021 are all accurate and complete.   Signed, Kenyatta Keidel C. Oval Linsey, MD, [facility]  01/01/2021 6:11 PM    [place]

## 2021-01-01 NOTE — Assessment & Plan Note (Signed)
Renal function stable.  Repeating basic metabolic panel in a week given that we are adding spironolactone.

## 2021-01-01 NOTE — Assessment & Plan Note (Signed)
Continue carvedilol 

## 2021-01-01 NOTE — Patient Instructions (Signed)
Medication Instructions:  START SPIRONOLACTONE 25 MG DAILY   *If you need a refill on your cardiac medications before your next appointment, please call your pharmacy*  Lab Work: BMET IN 1 WEEK   If you have labs (blood work) drawn today and your tests are completely normal, you will receive your results only by: Marland Kitchen MyChart Message (if you have MyChart) OR . A paper copy in the mail If you have any lab test that is abnormal or we need to change your treatment, we will call you to review the results.  Testing/Procedures: Your physician has requested that you have an echocardiogram. Echocardiography is a painless test that uses sound waves to create images of your heart. It provides your doctor with information about the size and shape of your heart and how well your heart's chambers and valves are working. This procedure takes approximately one hour. There are no restrictions for this procedure. [facility] [address]  TO BE DONE IN 2 MONTHS   Follow-Up: At [facility], you and your health needs are our priority.  As part of our continuing mission to provide you with exceptional heart care, we have created designated Provider Care Teams.  These Care Teams include your primary Cardiologist (physician) and Advanced Practice Providers (APPs -  Physician Assistants and Nurse Practitioners) who all work together to provide you with the care you need, when you need it.  We recommend signing up for the patient portal called "MyChart".  Sign up information is provided on this After Visit Summary.  MyChart is used to connect with patients for Virtual Visits (Telemedicine).  Patients are able to view lab/test results, encounter notes, upcoming appointments, etc.  Non-urgent messages can be sent to your provider as well.   To learn more about what you can do with MyChart, go to [email or web address].    Your next appointment:   2 month(s) AFTER ECHO   The format for your next  appointment:   In Person  Provider:   DR [place]

## 2021-01-01 NOTE — Assessment & Plan Note (Signed)
Blood pressure poorly controlled today.  We will add spironolactone 25 mg.  Continue carvedilol and Entresto.

## 2021-01-01 NOTE — Assessment & Plan Note (Signed)
He is doing well clinically.  Continue carvedilol, [place], and [place].  He is not taking Imdur.  We will add spironolactone 25 mg daily given that his blood pressure is elevated today.  Repeat BMP in a week.  We will recheck an echo in 2 months to assess his LVEF.  If it remains less than 35%, he will need consideration for an ICD.

## 2021-01-01 NOTE — Assessment & Plan Note (Signed)
Prior LAD PCI.  Currently no symptoms of ischemia.  Encouraged him to do some light exercising.  Continue aspirin, carvedilol, and rosuvastatin.

## 2021-01-02 MED ORDER — LEVEMIR FLEXTOUCH 100 UNIT/ML ~~LOC~~ SOPN: 8.0000 [IU] | PEN_INJECTOR | Freq: Two times a day (BID) | SUBCUTANEOUS | 1 refills | Status: DC

## 2021-01-03 ENCOUNTER — Telehealth: Payer: Self-pay | Admitting: Cardiovascular Disease

## 2021-01-03 ENCOUNTER — Telehealth: Payer: Self-pay | Admitting: Licensed Clinical Social Worker

## 2021-01-03 NOTE — Telephone Encounter (Addendum)
Spoke with Earl Calderon who explained that since the Retinal specialist is not within [facility] transportation not appropriate for him. Reached out to [employer] and she has actually spoken to this patient previously regarding outside [facility] transportation. Per Michiel Cowboy since patient has Medicaid he can use the Medicaid transportation services provided but he will need to call Social Services numbers below provided by her  [place] [address] [place], [place] [address] Phone: [phone] or DSS directly is [phone]  Left message for patient to call back and advised [name] as well

## 2021-01-03 NOTE — Telephone Encounter (Signed)
    [name] with [facility] transportation would like to speak with Dr. Blenda Mounts nurse. He said he is trying to advocate for the pt, pt was referred to a "[facility]" specialist which is a non [facility] facility. He is wondering if Dr. Oval Linsey can approve and willing for [place] to cover the cost to take the pt there. He spoke with billing and was told it is better to speak with Dr. Blenda Mounts nurse. He said when calling the number look for [name]

## 2021-01-03 NOTE — Telephone Encounter (Signed)
Information relayed to patient by Angeline H

## 2021-01-03 NOTE — Telephone Encounter (Signed)
Contacted by Rip Harbour, [place], as pt in need of ride to retina specialist.  LCSW had arranged previous ride under our cost center as it was a last minute request and at that time for a one time ride to eye doctor. Pt does have active Medicaid coverage listed therefore I recommend that pt utilize Medicaid transportation for non [facility] appointments due to ongoing costs of out of [facility] network rides. I have provided LPN and Transportation Services with DSS and [facility] number for pt to f/u with at this time.   Westley Hummer, MSW, [place]  [phone]

## 2021-01-04 ENCOUNTER — Telehealth: Payer: Self-pay | Admitting: Licensed Clinical Social Worker

## 2021-01-04 ENCOUNTER — Other Ambulatory Visit (HOSPITAL_BASED_OUTPATIENT_CLINIC_OR_DEPARTMENT_OTHER): Payer: Self-pay | Admitting: Cardiovascular Disease

## 2021-01-04 ENCOUNTER — Encounter (INDEPENDENT_AMBULATORY_CARE_PROVIDER_SITE_OTHER): Payer: Self-pay | Admitting: Primary Care

## 2021-01-04 NOTE — Telephone Encounter (Signed)
Received second additional request for ride w/ [employer] to out of [facility] retina specialist as Medicaid transportation is going to take two weeks to process ride per pt call back to [facility].   I have passed this request on to my team lead Kennyth Lose, as ride is both across [employer] and to a non [place]/non Heart/Vascular provider, I would like further guidance before charging this to our cost center at this time.   Westley Hummer, MSW, [place]  [phone]

## 2021-01-05 ENCOUNTER — Encounter: Payer: Self-pay | Admitting: Nurse Practitioner

## 2021-01-07 ENCOUNTER — Telehealth: Payer: Self-pay | Admitting: Primary Care

## 2021-01-07 NOTE — Telephone Encounter (Signed)
Patient has appt with Raul Del 5/11 but she will be working virtual. Called Pt no answer and vm was left that appt was virtual. If in person is preferred call [phone] to rescheduled.

## 2021-01-07 NOTE — Telephone Encounter (Signed)
Confirmed pt will need to enroll in/utilize Medicaid transportation for ongoing eye care visits. This has been communicated to [place], [place], and she is aware also.   Westley Hummer, MSW, [place]  [phone]

## 2021-01-08 ENCOUNTER — Encounter (HOSPITAL_BASED_OUTPATIENT_CLINIC_OR_DEPARTMENT_OTHER): Payer: Self-pay

## 2021-01-09 ENCOUNTER — Encounter: Payer: Self-pay | Admitting: Nurse Practitioner

## 2021-01-09 ENCOUNTER — Other Ambulatory Visit: Payer: Self-pay

## 2021-01-09 ENCOUNTER — Ambulatory Visit: Payer: Medicare Other | Attending: Nurse Practitioner | Admitting: Nurse Practitioner

## 2021-01-09 VITALS — Ht 70.0 in | Wt 145.0 lb

## 2021-01-09 DIAGNOSIS — E785 Hyperlipidemia, unspecified: Secondary | ICD-10-CM

## 2021-01-09 DIAGNOSIS — I10 Essential (primary) hypertension: Secondary | ICD-10-CM | POA: Diagnosis not present

## 2021-01-09 DIAGNOSIS — D649 Anemia, unspecified: Secondary | ICD-10-CM

## 2021-01-09 DIAGNOSIS — E118 Type 2 diabetes mellitus with unspecified complications: Secondary | ICD-10-CM

## 2021-01-09 DIAGNOSIS — E11319 Type 2 diabetes mellitus with unspecified diabetic retinopathy without macular edema: Secondary | ICD-10-CM | POA: Diagnosis not present

## 2021-01-09 DIAGNOSIS — Z7689 Persons encountering health services in other specified circumstances: Secondary | ICD-10-CM

## 2021-01-09 DIAGNOSIS — Z1211 Encounter for screening for malignant neoplasm of colon: Secondary | ICD-10-CM

## 2021-01-09 DIAGNOSIS — Z125 Encounter for screening for malignant neoplasm of prostate: Secondary | ICD-10-CM

## 2021-01-09 DIAGNOSIS — Z1159 Encounter for screening for other viral diseases: Secondary | ICD-10-CM

## 2021-01-09 DIAGNOSIS — R350 Frequency of micturition: Secondary | ICD-10-CM

## 2021-01-09 MED ORDER — ROSUVASTATIN CALCIUM 40 MG PO TABS: 40.0000 mg | ORAL_TABLET | Freq: Every day | ORAL | 3 refills | Status: DC

## 2021-01-09 NOTE — Progress Notes (Signed)
Virtual Visit via Telephone Note Due to national recommendations of social distancing due to [place] 19, telehealth visit is felt to be most appropriate for this patient at this time.  I discussed the limitations, risks, security and privacy concerns of performing an evaluation and management service by telephone and the availability of in person appointments. I also discussed with the patient that there may be a patient responsible charge related to this service. The patient expressed understanding and agreed to proceed.    I connected with [name] on 01/09/21  at   1:50 PM EDT  EDT by telephone and verified that I am speaking with the correct person using two identifiers.   Consent I discussed the limitations, risks, security and privacy concerns of performing an evaluation and management service by telephone and the availability of in person appointments. I also discussed with the patient that there may be a patient responsible charge related to this service. The patient expressed understanding and agreed to proceed.   Location of Patient: Private Residence   Location of Provider: [facility] [facility] [employer] Office    Persons participating in Telemedicine visit: Geryl Rankins FNP-BC [place]    History of Present Illness: Telemedicine visit for: Establish Care She has a past medical history of CAD in native artery (01/01/2021), Coronary artery disease, DM2 , Hernia, inguinal, Hypertension, NSVT (08/24/2020), HPL  Patient has been counseled on age-appropriate routine health concerns for screening and prevention. These are reviewed and up-to-date. Referrals have been placed accordingly. Immunizations are up-to-date or declined.    Patient has been counseled on age-appropriate routine health concerns for screening and prevention. These are reviewed and up-to-date. Referrals have been placed accordingly. Immunizations are up-to-date or declined.    Colonoscopy: He has never had this procedure.  Refer to gastroenterology today. PSA: Overdue HEP C: Overdue  Blurred Vision Was seen by [facility] care and recommended to see a retinal specialist.  States he was referred to [facility] eye however there was an issue with [place] and he was not able to make his appointment with the retinal specialist.  Essential Hypertension Monitoring his blood pressure at home. Last recorded reading 150/84. Does endorse missing doses of blood pressure medication (carvedilol 25 mg twice daily).  He endorses adherence taking furosemide 40 mg daily, Entresto 97-103 mg twice daily, spironolactone 25 mg daily.  He sees Dr. Oval Linsey cardiologist for his CAD and heart failure. BP Readings from Last 3 Encounters:  01/01/21 (!) 154/74  10/22/20 124/76  10/18/20 (!) 96/58    DM 2 Not well optimized. Monitoring his blood glucose levels 3-5 times per day. Highest reading this past week 199 and lowest reading 140.  Taking Farxiga 10 mg daily, NovoLog sliding scale insulin and Levemir 8 units twice daily.  LDL is not at goal and his rosuvastatin appears to be expired (question adherence).  Will refill today. Lab Results  Component Value Date   HGBA1C 7.7 (A) 10/18/2020   Lab Results  Component Value Date   [place] 92 07/12/2020   Past Medical History:  Diagnosis Date  . CAD in native artery 01/01/2021   Prior LAD PCI.  Currently no symptoms of ischemia.  Encouraged him to do some light exercising.  Continue aspirin, carvedilol, and rosuvastatin.  . CKD (chronic kidney disease), stage III ([place]) 08/24/2020  . Coronary artery disease   . Diabetes mellitus without complication ([place])   . Hernia, inguinal   . Hypertension   .  NSVT (nonsustained ventricular tachycardia) ([place]) 08/24/2020  . Uncontrolled type 2 diabetes mellitus ([place]) 11/11/2018    Past Surgical History:  Procedure Laterality Date  . CORONARY STENT INTERVENTION N/A  11/15/2018   Procedure: CORONARY STENT INTERVENTION;  Surgeon: [name], MD;  Location: [place] [facility];  Service: Cardiovascular;  Laterality: N/A;  . HEMORROIDECTOMY    . RIGHT HEART CATH N/A 07/18/2020   Procedure: RIGHT HEART CATH;  Surgeon: [name], MD;  Location: [place] [facility];  Service: Cardiovascular;  Laterality: N/A;  . RIGHT/LEFT HEART CATH AND CORONARY ANGIOGRAPHY N/A 11/15/2018   Procedure: RIGHT/LEFT HEART CATH AND CORONARY ANGIOGRAPHY;  Surgeon: [name], MD;  Location: [place] [facility];  Service: Cardiovascular;  Laterality: N/A;    Family History  Problem Relation Age of Onset  . Hypertension Mother   . Diabetes Mellitus II Mother   . Arrhythmia Mother   . Heart disease Mother   . Hypertension Sister   . Hypertension Brother   . Hypertension Other   . Diabetes Mellitus II Other        All 9 sibblings have HTN    Social History   Socioeconomic History  . Marital status: Married    Spouse name: Not on file  . Number of children: Not on file  . Years of education: Not on file  . Highest education level: Not on file  Occupational History  . Not on file  Tobacco Use  . Smoking status: Never Smoker  . Smokeless tobacco: Never Used  Vaping Use  . Vaping Use: Never used  Substance and Sexual Activity  . Alcohol use: Never  . Drug use: Never  . Sexual activity: Not on file  Other Topics Concern  . Not on file  Social History Narrative  . Not on file   Social Determinants of Health   Financial Resource Strain: Medium Risk  . Difficulty of Paying Living Expenses: Somewhat hard  Food Insecurity: No Food Insecurity  . Worried About Charity fundraiser in the Last Year: Never true  . Ran Out of Food in the Last Year: Never true  Transportation Needs: Unmet Transportation Needs  . Lack of Transportation (Medical): Yes  . Lack of Transportation (Non-Medical): Yes  Physical Activity: Not on file  Stress: Not on file   Social Connections: Not on file     Observations/Objective: Awake, alert and oriented x 3   Review of Systems  Constitutional: Negative for fever, malaise/fatigue and weight loss.  HENT: Negative.  Negative for nosebleeds.   Eyes: Positive for blurred vision. Negative for double vision and photophobia.  Respiratory: Negative.  Negative for cough and shortness of breath.   Cardiovascular: Negative.  Negative for chest pain, palpitations and leg swelling.  Gastrointestinal: Negative.  Negative for heartburn, nausea and vomiting.  Musculoskeletal: Negative.  Negative for myalgias.  Neurological: Negative.  Negative for dizziness, focal weakness, seizures and headaches.  Psychiatric/Behavioral: Negative.  Negative for suicidal ideas.    Assessment and Plan: Diagnoses and all orders for this visit:  Encounter to establish care  Diabetic retinopathy associated with type 2 diabetes mellitus, macular edema presence unspecified, unspecified laterality, unspecified retinopathy severity ([place]) -     Ambulatory referral to Ophthalmology  Dyslipidemia, goal LDL below 70 -     rosuvastatin (CRESTOR) 40 MG tablet; Take 1 tablet (40 mg total) by mouth daily at 6 PM. -     Lipid panel; Future INSTRUCTIONS: Work on a low  fat, heart healthy diet and participate in regular aerobic exercise program by working out at least 150 minutes per week; 5 days a week-30 minutes per day. Avoid red meat/beef/steak,  fried foods. junk foods, sodas, sugary drinks, unhealthy snacking, alcohol and smoking.  Drink at least 80 oz of water per day and monitor your carbohydrate intake daily.   DM (diabetes mellitus), type 2 with complications (HCC) -     Hemoglobin A1c; Future Continue blood sugar control as discussed in office today, low carbohydrate diet, and regular physical exercise as tolerated, 150 minutes per week (30 min each day, 5 days per week, or 50 min 3 days per week). Keep blood sugar logs with fasting goal  of 90-130 mg/dl, post prandial (after you eat) less than 180.  For Hypoglycemia: BS <60 and Hyperglycemia BS >400; contact the clinic ASAP. Annual eye exams and foot exams are recommended.   Primary hypertension -     CMP14+EGFR; Future Continue all antihypertensives as prescribed.  Remember to bring in your blood pressure log with you for your follow up appointment.  DASH/Mediterranean Diets are healthier choices for HTN.    Colon cancer screening -     Ambulatory referral to Gastroenterology  Anemia, unspecified type -     CBC; Future  Need for hepatitis C screening test -     HCV Ab w Reflex to Quant PCR; Future  Urine frequency -     PSA; Future  Prostate cancer screening -     PSA; Future     Follow Up Instructions Return in about 3 months (around 04/11/2021).     I discussed the assessment and treatment plan with the patient. The patient was provided an opportunity to ask questions and all were answered. The patient agreed with the plan and demonstrated an understanding of the instructions.   The patient was advised to call back or seek an in-person evaluation if the symptoms worsen or if the condition fails to improve as anticipated.  I provided 18 minutes of non-face-to-face time during this encounter including median intraservice time, reviewing previous notes, labs, imaging, medications and explaining diagnosis and management.  Gildardo Pounds, FNP-BC

## 2021-01-10 NOTE — Telephone Encounter (Signed)
See 3/1 telephone note

## 2021-01-14 ENCOUNTER — Telehealth: Payer: Self-pay | Admitting: Nurse Practitioner

## 2021-01-14 ENCOUNTER — Telehealth: Payer: Self-pay | Admitting: Cardiovascular Disease

## 2021-01-14 NOTE — Telephone Encounter (Signed)
Copied from [place] [id number]. Topic: General - Other >> Jan 14, 2021  9:54 AM Tessa Lerner A wrote: Reason for CRM: Mo with Transportation has made contact to request authorization for transport for patient   The patient will need to travel to a [place] facility for further testing tomorrow 01/15/21  Because [facility] doesn't fall under the scope of [facility] additional verification is needed for rides to and from the facility by the patient's PCP  Please contact to further advise when possible   Reached by out to [facility] and advised that if Patient is coming for his Labs for [place] then he can be brought to [facility] for labs to be drawn. Authorization to a non [facility] facility is not given.

## 2021-01-14 NOTE — Telephone Encounter (Signed)
Mo aware labs were ordered by PCP need to contact Geryl Rankins NP office and pt needs to enroll in [place] transportation as well .Adonis Housekeeper

## 2021-01-14 NOTE — Telephone Encounter (Signed)
Earl Calderon from [place] calling to clarify where the patient needs to go for lab work. She states the patient told them he was referred for lab work, but was unclear if it is at [facility] or [facility]. She states he told them he just has to get it done before 5/26. Phone: [phone], ask for Earl Calderon

## 2021-01-15 ENCOUNTER — Ambulatory Visit (INDEPENDENT_AMBULATORY_CARE_PROVIDER_SITE_OTHER): Payer: Medicare Other | Admitting: Primary Care

## 2021-01-16 ENCOUNTER — Other Ambulatory Visit: Payer: Self-pay

## 2021-01-16 ENCOUNTER — Ambulatory Visit: Payer: Medicare Other | Attending: Nurse Practitioner

## 2021-01-16 NOTE — Addendum Note (Signed)
Addended bySteffanie Dunn on: 01/16/2021 04:59 PM   Modules accepted: Orders

## 2021-01-17 LAB — CMP14+EGFR
ALT: 16 IU/L (ref 0–44)
AST: 21 IU/L (ref 0–40)
Albumin/Globulin Ratio: 1 — ABNORMAL LOW (ref 1.2–2.2)
Albumin: 3.9 g/dL (ref 3.8–4.8)
Alkaline Phosphatase: 84 IU/L (ref 44–121)
BUN/Creatinine Ratio: 19 (ref 10–24)
BUN: 19 mg/dL (ref 8–27)
Bilirubin Total: 0.7 mg/dL (ref 0.0–1.2)
CO2: 25 mmol/L (ref 20–29)
Calcium: 9 mg/dL (ref 8.6–10.2)
Chloride: 105 mmol/L (ref 96–106)
Creatinine, Ser: 1 mg/dL (ref 0.76–1.27)
Globulin, Total: 3.9 g/dL (ref 1.5–4.5)
Glucose: 149 mg/dL — ABNORMAL HIGH (ref 65–99)
Potassium: 4 mmol/L (ref 3.5–5.2)
Sodium: 144 mmol/L (ref 134–144)
Total Protein: 7.8 g/dL (ref 6.0–8.5)
eGFR: 82 mL/min/{1.73_m2} (ref >59)

## 2021-01-17 LAB — CBC
Hematocrit: 35.8 % — ABNORMAL LOW (ref 37.5–51.0)
Hemoglobin: 12.2 g/dL — ABNORMAL LOW (ref 13.0–17.7)
MCH: 31.6 pg (ref 26.6–33.0)
MCHC: 34.1 g/dL (ref 31.5–35.7)
MCV: 93 fL (ref 79–97)
Platelets: 174 10*3/uL (ref 150–450)
RBC: 3.86 x10E6/uL — ABNORMAL LOW (ref 4.14–5.80)
RDW: 12.1 % (ref 11.6–15.4)
WBC: 4.7 10*3/uL (ref 3.4–10.8)

## 2021-01-17 LAB — HEMOGLOBIN A1C
Est. average glucose Bld gHb Est-mCnc: 143 mg/dL
Hgb A1c MFr Bld: 6.6 % — ABNORMAL HIGH (ref 4.8–5.6)

## 2021-01-17 LAB — LIPID PANEL
Chol/HDL Ratio: 2.2 ratio (ref 0.0–5.0)
Cholesterol, Total: 103 mg/dL (ref 100–199)
HDL: 47 mg/dL (ref >39)
LDL Chol Calc (NIH): 42 mg/dL (ref 0–99)
Triglycerides: 66 mg/dL (ref 0–149)
VLDL Cholesterol Cal: 14 mg/dL (ref 5–40)

## 2021-01-17 LAB — HCV AB W REFLEX TO QUANT PCR: HCV Ab: <0.1 s/co ratio (ref 0.0–0.9)

## 2021-01-17 LAB — PSA: Prostate Specific Ag, Serum: 0.7 ng/mL (ref 0.0–4.0)

## 2021-01-17 LAB — HCV INTERPRETATION

## 2021-01-18 ENCOUNTER — Encounter: Payer: Self-pay | Admitting: Nurse Practitioner

## 2021-01-19 ENCOUNTER — Encounter: Payer: Self-pay | Admitting: Nurse Practitioner

## 2021-01-22 ENCOUNTER — Encounter: Payer: Self-pay | Admitting: Gastroenterology

## 2021-01-24 ENCOUNTER — Ambulatory Visit: Payer: Medicare Other | Admitting: Cardiovascular Disease

## 2021-01-25 ENCOUNTER — Other Ambulatory Visit (HOSPITAL_COMMUNITY): Payer: Medicare Other

## 2021-01-31 NOTE — Progress Notes (Addendum)
[facility] Note  02/04/2021     CHIEF COMPLAINT Patient presents for Retina Evaluation   HISTORY OF PRESENT ILLNESS: Earl Calderon is a 70 y.o. male who presents to the clinic today for:   HPI    Retina Evaluation    In both eyes.  This started weeks ago.  Duration of weeks.  Context:  distance vision and near vision.  I, the attending physician,  performed the HPI with the patient and updated documentation appropriately.          Comments    BS: ??? A1c: ???  (epic reports 6.6) Pt states he is unsure of how long he has been a diabetic.    Pt states his vision has been blurry in both eyes x 4 months--states everything looks very blurry and cloudy OU.  Pt denies eye pain or discomfort.  Pt denies flashes of light or floaters OU.       Last edited by [name], MD on 02/04/2021 11:21 AM. (History)    pt is here on the referral of Dr. [name] for concern of DME, pt states he has never had any eye problems until now, he states he has been diabetic for "years", pt states he as hospitalized in 2021 for a stroke and hypoglycemia, pts last A1c was 6.6 on 05.18.22  Referring physician: Debbra Riding, MD [address] [place],  [place] [address]  HISTORICAL INFORMATION:   Selected notes from the MEDICAL RECORD NUMBER Referred by Dr. [name] for severe NPDR OU LEE:  Ocular Hx- PMH-    CURRENT MEDICATIONS: No current outpatient medications on file. (Ophthalmic Drugs)   No current facility-administered medications for this visit. (Ophthalmic Drugs)   Current Outpatient Medications (Other)  Medication Sig  . aspirin EC 81 MG tablet Take 1 tablet (81 mg total) by mouth daily. Swallow whole.  . carvedilol (COREG) 25 MG tablet Take 1 tablet (25 mg total) by mouth 2 (two) times daily with a meal.  . dapagliflozin propanediol (FARXIGA) 10 MG TABS tablet Take 1 tablet (10 mg total) by mouth daily before breakfast.  . ferrous sulfate 325  (65 FE) MG tablet Take 1 tablet (325 mg total) by mouth daily with breakfast.  . furosemide (LASIX) 40 MG tablet Take 1 tablet (40 mg total) by mouth daily.  Marland Kitchen glucose blood (FREESTYLE TEST STRIPS) test strip Use as instructed  . insulin aspart (NOVOLOG FLEXPEN) 100 UNIT/ML FlexPen Before each meal 3 times a day, 140-199 - 2 units, 200-250 - 4 units, 251-299 - 6 units,  300-349 - 8 units,  350 or above 10 units. Insulin PEN if approved, provide syringes and needles if needed.  . Insulin Pen Needle (NOVOFINE PEN NEEDLE) 32G X 6 MM MISC USE AS DIRECTED  . LEVEMIR FLEXTOUCH 100 UNIT/ML FlexPen Inject 8 Units into the skin 2 (two) times daily.  . nitroGLYCERIN (NITROSTAT) 0.4 MG SL tablet Place 1 tablet (0.4 mg total) under the tongue every 5 (five) minutes as needed for chest pain.  . rosuvastatin (CRESTOR) 40 MG tablet Take 1 tablet (40 mg total) by mouth daily at 6 PM.  . sacubitril-valsartan (ENTRESTO) 97-103 MG Take 1 tablet by mouth 2 (two) times daily.  Marland Kitchen spironolactone (ALDACTONE) 25 MG tablet Take 1 tablet (25 mg total) by mouth daily.   No current facility-administered medications for this visit. (Other)      REVIEW OF SYSTEMS: ROS    Positive  for: Gastrointestinal, Musculoskeletal, Cardiovascular, Eyes   Negative for: Constitutional, Neurological, Skin, Genitourinary, HENT, Endocrine, Respiratory, Psychiatric, Allergic/Imm, Heme/Lymph   Last edited by Doneen Poisson on 02/04/2021  8:43 AM. (History)       ALLERGIES No Known Allergies  PAST MEDICAL HISTORY Past Medical History:  Diagnosis Date  . CAD in native artery 01/01/2021   Prior LAD PCI.  Currently no symptoms of ischemia.  Encouraged him to do some light exercising.  Continue aspirin, carvedilol, and rosuvastatin.  . CKD (chronic kidney disease), stage III ([facility]) 08/24/2020  . Coronary artery disease   . Diabetes mellitus without complication ([facility])   . Hernia, inguinal   . Hypertension   . NSVT (nonsustained  ventricular tachycardia) ([facility]) 08/24/2020  . Uncontrolled type 2 diabetes mellitus ([facility]) 11/11/2018   Past Surgical History:  Procedure Laterality Date  . CORONARY STENT INTERVENTION N/A 11/15/2018   Procedure: CORONARY STENT INTERVENTION;  Surgeon: [name], MD;  Location: [facility];  Service: Cardiovascular;  Laterality: N/A;  . HEMORROIDECTOMY    . RIGHT HEART CATH N/A 07/18/2020   Procedure: RIGHT HEART CATH;  Surgeon: [name], MD;  Location: [name];  Service: Cardiovascular;  Laterality: N/A;  . RIGHT/LEFT HEART CATH AND CORONARY ANGIOGRAPHY N/A 11/15/2018   Procedure: RIGHT/LEFT HEART CATH AND CORONARY ANGIOGRAPHY;  Surgeon: [name], MD;  Location: [facility];  Service: Cardiovascular;  Laterality: N/A;    FAMILY HISTORY Family History  Problem Relation Age of Onset  . Hypertension Mother   . Diabetes Mellitus II Mother   . Arrhythmia Mother   . Heart disease Mother   . Hypertension Sister   . Hypertension Brother   . Hypertension Other   . Diabetes Mellitus II Other        All 9 sibblings have HTN    SOCIAL HISTORY Social History   Tobacco Use  . Smoking status: Never Smoker  . Smokeless tobacco: Never Used  Vaping Use  . Vaping Use: Never used  Substance Use Topics  . Alcohol use: Never  . Drug use: Never         OPHTHALMIC EXAM:  Base Eye Exam    Visual Acuity (Snellen - Linear)      Right Left   Dist cc 20/80 -2 20/250   Dist ph cc NI 20/200 -2   Correction: Glasses       Tonometry (Tonopen, 8:33 AM)      Right Left   Pressure 15 14       Pupils      Dark Light Shape React APD   Right 4 3 Round Minimal 0   Left 4 3 Round Minimal 0       Visual Fields      Left Right   Restrictions Partial outer superior nasal, inferior nasal deficiencies Partial outer superior temporal, inferior nasal deficiencies       Extraocular Movement      Right Left    Full Full       Neuro/Psych     Oriented x3: Yes   Mood/Affect: Normal       Dilation    Both eyes: 1.0% Mydriacyl, 2.5% Phenylephrine @ 8:33 AM        Slit Lamp and Fundus Exam    Slit Lamp Exam      Right Left   Lids/Lashes Dermatochalasis - upper lid Dermatochalasis - upper lid   Conjunctiva/Sclera Melanosis Melanosis   Cornea  arcus arcus   Anterior Chamber deep and clear deep and clear   Iris Round and dilated, No NVI Round and dilated, No NVI   Lens 2-3+ Nuclear sclerosis, 2-3+ Cortical cataract 2-3+ Nuclear sclerosis, 2-3+ Cortical cataract   Vitreous Vitreous syneresis Vitreous syneresis       Fundus Exam      Right Left   Disc Pink and Sharp, no NVD Pink and Sharp, no NVD   C/D Ratio 0.6 0.6   Macula Blunted foveal reflex, central edema, scattered MA/exudate, +DBH Blunted foveal reflex, central edema, +DBH/exudates   Vessels attenuated, Tortuous, focal NV IN arcades   attenuated, Tortuous   Periphery Attached, 360 MA/DBH greatest posteriorly Attached, 360 DBH         Refraction    Manifest Refraction      Sphere   Right NI +/-3.00   Left NI +/-3.00          IMAGING AND PROCEDURES  Imaging and Procedures for 02/04/2021  OCT, Retina - OU - Both Eyes       Right Eye Quality was good. Central Foveal Thickness: 562. Progression has no prior data. Findings include abnormal foveal contour, intraretinal fluid, subretinal fluid, intraretinal hyper-reflective material, vitreomacular adhesion  (+DME with trace sliver of SRF centrally).   Left Eye Quality was good. Central Foveal Thickness: 688. Progression has no prior data. Findings include abnormal foveal contour, intraretinal fluid, no SRF, intraretinal hyper-reflective material, vitreomacular adhesion  (+DME greatest temporal macula with large central cyst).   Notes *Images captured and stored on drive  Diagnosis / Impression:  Abnormal foveal contour and +DME OU OD: trace sliver of SRF centrally OS: greatest temporal macula with large  central cyst  Clinical management:  See below  Abbreviations: NFP - Normal foveal profile. CME - cystoid macular edema. PED - pigment epithelial detachment. IRF - intraretinal fluid. SRF - subretinal fluid. EZ - ellipsoid zone. ERM - epiretinal membrane. ORA - outer retinal atrophy. ORT - outer retinal tubulation. SRHM - subretinal hyper-reflective material. IRHM - intraretinal hyper-reflective material        Fluorescein Angiography Optos (Transit OS)       Right Eye   Progression has no prior data. Early phase findings include vascular perfusion defect, microaneurysm, retinal neovascularization. Mid/Late phase findings include microaneurysm, retinal neovascularization, vascular perfusion defect, leakage.   Left Eye   Progression has no prior data. Early phase findings include delayed filling, vascular perfusion defect, microaneurysm (Patchy choroidal filling, delayed venous return). Mid/Late phase findings include leakage, microaneurysm, vascular perfusion defect.   Notes **Images stored on drive**  Impression: OD: PDR -- focal NVE IN arcades OS: severe NPDR -- no NV - vascular perfusion defects OU - Late leaking MA OU         Intravitreal Injection, Pharmacologic Agent - OD - Right Eye       Time Out 02/04/2021. 9:47 AM. Confirmed correct patient, procedure, site, and patient consented.   Anesthesia Topical anesthesia was used. Anesthetic medications included Lidocaine 2%, Proparacaine 0.5%.   Procedure Preparation included 5% betadine to ocular surface, eyelid speculum. A supplied needle was used.   Injection:  1.25 mg Bevacizumab (AVASTIN) 1.25mg /0.43mL SOLN   NDC: 35465-681-27, Lot: [id number] , Expiration date: 03/13/2021   Route: Intravitreal, Site: Right Eye, Waste: 0 mL  Post-op Post injection exam found visual acuity of at least counting fingers. The patient tolerated the procedure well. There were no complications. The patient received written and  verbal post procedure care education. Post  injection medications were not given.        Intravitreal Injection, Pharmacologic Agent - OS - Left Eye       Time Out 02/04/2021. 9:47 AM. Confirmed correct patient, procedure, site, and patient consented.   Anesthesia Topical anesthesia was used. Anesthetic medications included Lidocaine 2%, Proparacaine 0.5%.   Procedure Preparation included 5% betadine to ocular surface, eyelid speculum. A (32g) needle was used.   Injection:  1.25 mg Bevacizumab (AVASTIN) 1.25mg /0.30mL SOLN   NDC: 32355-732-20, Lot: [id number], Expiration date: 03/13/2021   Route: Intravitreal, Site: Left Eye, Waste: 0.05 mL  Post-op Post injection exam found visual acuity of at least counting fingers. The patient tolerated the procedure well. There were no complications. The patient received written and verbal post procedure care education. Post injection medications were not given.                 ASSESSMENT/PLAN:    ICD-10-CM   1. Proliferative diabetic retinopathy of right eye with macular edema associated with type 2 diabetes mellitus (HCC)  C37.6283 Intravitreal Injection, Pharmacologic Agent - OD - Right Eye    Bevacizumab (AVASTIN) SOLN 1.25 mg  2. Severe nonproliferative diabetic retinopathy of left eye with macular edema associated with type 2 diabetes mellitus (HCC)  T51.7616 Intravitreal Injection, Pharmacologic Agent - OS - Left Eye    Bevacizumab (AVASTIN) SOLN 1.25 mg  3. Retinal edema  H35.81 OCT, Retina - OU - Both Eyes  4. Essential hypertension  I10   5. Hypertensive retinopathy of both eyes  H35.033 Fluorescein Angiography Optos (Transit OS)  6. Combined forms of age-related cataract of both eyes  H25.813     1-3. Proliferative diabetic retinopathy, OD  - severe NPDR OS - The incidence, risk factors for progression, natural history and treatment options for diabetic retinopathy were discussed with patient.   - The need for close monitoring  of blood glucose, blood pressure, and serum lipids, avoiding cigarette or any type of tobacco, and the need for long term follow up was also discussed with patient. - exam shows central edema OU, scattered - FA (06.06.22) shows OD: focal NVE IN arcades; OS: no NV OS; late leaking MA's OU -- will need PRP OD - OCT shows diabetic macular edema, OU  - The natural history, pathology, and characteristics of diabetic macular edema discussed with patient.  A generalized discussion of the major clinical trials concerning treatment of diabetic macular edema (ETDRS, DCT, SCORE, RISE / RIDE, and ongoing DRCR net studies) was completed.  This discussion included mention of the various approaches to treating diabetic macular edema (observation, laser photocoagulation, anti-VEGF injections with lucentis / Avastin / Eylea, steroid injections with Kenalog / Ozurdex, and intraocular surgery with vitrectomy).  The goal hemoglobin A1C of 6-7 was discussed, as well as importance of smoking cessation and hypertension control.  Need for ongoing treatment and monitoring were specifically discussed with reference to chronic nature of diabetic macular edema. - recommend IVA OU #1 today, 06.06.22 - pt wishes to proceed with injections - RBA of procedure discussed, questions answered - IVA informed consent obtained and signed (OU), 06.06.22 - see procedure note - f/u Friday at 10:30 -- DFE/OCT, PRP OD  4,5. Hypertensive retinopathy OU - discussed importance of tight BP control - monitor  6. Mixed Cataract OU - The symptoms of cataract, surgical options, and treatments and risks were discussed with patient. - discussed diagnosis and progression - not yet visually significant - monitor for now    Ophthalmic  Meds Ordered this visit:  Meds ordered this encounter  Medications  . Bevacizumab (AVASTIN) SOLN 1.25 mg  . Bevacizumab (AVASTIN) SOLN 1.25 mg       Return in about 4 days (around 02/08/2021) for Laser PRP  OD.  There are no Patient Instructions on file for this visit.  This document serves as a record of services personally performed by Gardiner Sleeper, MD, PhD. It was created on their behalf by Leeann Must, [place], an ophthalmic technician. The creation of this record is the provider's dictation and/or activities during the visit.    Electronically signed by: Leeann Must, COA @TODAY @ 12:00 PM   This document serves as a record of services personally performed by Gardiner Sleeper, MD, PhD. It was created on their behalf by San Jetty. Owens Shark, OA an ophthalmic technician. The creation of this record is the provider's dictation and/or activities during the visit.    Electronically signed by: San Jetty. Owens Shark, [place] 06.06.2022 12:00 PM  I have reviewed the above documentation for accuracy and completeness, and I agree with the above. Gardiner Sleeper, M.D., Ph.D. 02/04/21 12:00 PM   Abbreviations: M myopia (nearsighted); A astigmatism; H hyperopia (farsighted); P presbyopia; Mrx spectacle prescription;  CTL contact lenses; OD right eye; OS left eye; OU both eyes  XT exotropia; ET esotropia; PEK punctate epithelial keratitis; PEE punctate epithelial erosions; DES dry eye syndrome; MGD meibomian gland dysfunction; ATs artificial tears; PFAT's preservative free artificial tears; [place] nuclear sclerotic cataract; PSC posterior subcapsular cataract; ERM epi-retinal membrane; PVD posterior vitreous detachment; RD retinal detachment; DM diabetes mellitus; DR diabetic retinopathy; NPDR non-proliferative diabetic retinopathy; PDR proliferative diabetic retinopathy; CSME clinically significant macular edema; DME diabetic macular edema; dbh dot blot hemorrhages; CWS cotton wool spot; POAG primary open angle glaucoma; C/D cup-to-disc ratio; HVF humphrey visual field; GVF goldmann visual field; OCT optical coherence tomography; IOP intraocular pressure; BRVO Branch retinal vein occlusion; CRVO central retinal vein occlusion;  CRAO central retinal artery occlusion; BRAO branch retinal artery occlusion; RT retinal tear; SB scleral buckle; PPV pars plana vitrectomy; VH Vitreous hemorrhage; PRP panretinal laser photocoagulation; IVK intravitreal kenalog; VMT vitreomacular traction; MH Macular hole;  NVD neovascularization of the disc; NVE neovascularization elsewhere; AREDS age related eye disease study; ARMD age related macular degeneration; POAG primary open angle glaucoma; EBMD epithelial/anterior basement membrane dystrophy; ACIOL anterior chamber intraocular lens; IOL intraocular lens; PCIOL posterior chamber intraocular lens; Phaco/IOL phacoemulsification with intraocular lens placement; [place] photorefractive keratectomy; LASIK laser assisted in situ keratomileusis; HTN hypertension; DM diabetes mellitus; COPD chronic obstructive pulmonary disease

## 2021-02-04 ENCOUNTER — Other Ambulatory Visit: Payer: Self-pay

## 2021-02-04 ENCOUNTER — Encounter (INDEPENDENT_AMBULATORY_CARE_PROVIDER_SITE_OTHER): Payer: Self-pay | Admitting: Ophthalmology

## 2021-02-04 ENCOUNTER — Ambulatory Visit (INDEPENDENT_AMBULATORY_CARE_PROVIDER_SITE_OTHER): Payer: Medicare Other | Admitting: Ophthalmology

## 2021-02-04 DIAGNOSIS — E113511 Type 2 diabetes mellitus with proliferative diabetic retinopathy with macular edema, right eye: Secondary | ICD-10-CM

## 2021-02-04 DIAGNOSIS — H25813 Combined forms of age-related cataract, bilateral: Secondary | ICD-10-CM

## 2021-02-04 DIAGNOSIS — H3581 Retinal edema: Secondary | ICD-10-CM | POA: Diagnosis not present

## 2021-02-04 DIAGNOSIS — E113412 Type 2 diabetes mellitus with severe nonproliferative diabetic retinopathy with macular edema, left eye: Secondary | ICD-10-CM

## 2021-02-04 DIAGNOSIS — H35033 Hypertensive retinopathy, bilateral: Secondary | ICD-10-CM

## 2021-02-04 DIAGNOSIS — I10 Essential (primary) hypertension: Secondary | ICD-10-CM

## 2021-02-04 MED ORDER — BEVACIZUMAB CHEMO INJECTION 1.25MG/0.05ML SYRINGE FOR KALEIDOSCOPE
1.2500 mg | INTRAVITREAL | Status: AC | PRN
  Administered 2021-02-04: 1.25 mg via INTRAVITREAL

## 2021-02-05 NOTE — Progress Notes (Signed)
[facility] Note  02/08/2021     CHIEF COMPLAINT Patient presents for Retina Follow Up   HISTORY OF PRESENT ILLNESS: Earl Calderon is a 70 y.o. male who presents to the clinic today for:   HPI     Retina Follow Up   Patient presents with  Diabetic Retinopathy.  In both eyes.  This started days ago.  Severity is moderate.  Duration of 4 days.  Since onset it is gradually improving.  I, the attending physician,  performed the HPI with the patient and updated documentation appropriately.        Comments   70 y/o male pt here for PRP OD.  Feels [facility] OD is already improving s/p IVA on 6.6.22.  No change in [place] OS noticed.  Denies pain, FOL, floaters.  No gtts.  BS and A1C unknown.      Last edited by [name], MD on 02/08/2021 12:36 PM.    pt is here for [facility] OD  Referring physician: Gildardo Pounds, NP [place],  [place] [id number]  HISTORICAL INFORMATION:   Selected notes from the MEDICAL RECORD NUMBER Referred by Dr. [name] for severe NPDR OU LEE:  Ocular Hx- PMH-    CURRENT MEDICATIONS: No current outpatient medications on file. (Ophthalmic Drugs)   No current facility-administered medications for this visit. (Ophthalmic Drugs)   Current Outpatient Medications (Other)  Medication Sig   aspirin EC 81 MG tablet Take 1 tablet (81 mg total) by mouth daily. Swallow whole.   carvedilol (COREG) 25 MG tablet Take 1 tablet (25 mg total) by mouth 2 (two) times daily with a meal.   dapagliflozin propanediol (FARXIGA) 10 MG TABS tablet Take 1 tablet (10 mg total) by mouth daily before breakfast.   ferrous sulfate 325 (65 FE) MG tablet Take 1 tablet (325 mg total) by mouth daily with breakfast.   furosemide (LASIX) 40 MG tablet Take 1 tablet (40 mg total) by mouth daily.   glucose blood (FREESTYLE TEST STRIPS) test strip Use as instructed   insulin aspart (NOVOLOG FLEXPEN) 100 UNIT/ML FlexPen Before each meal 3 times a day,  140-199 - 2 units, 200-250 - 4 units, 251-299 - 6 units,  300-349 - 8 units,  350 or above 10 units. Insulin PEN if approved, provide syringes and needles if needed.   Insulin Pen Needle (NOVOFINE PEN NEEDLE) 32G X 6 MM MISC USE AS DIRECTED   LEVEMIR FLEXTOUCH 100 UNIT/ML FlexPen Inject 8 Units into the skin 2 (two) times daily.   nitroGLYCERIN (NITROSTAT) 0.4 MG SL tablet Place 1 tablet (0.4 mg total) under the tongue every 5 (five) minutes as needed for chest pain.   rosuvastatin (CRESTOR) 40 MG tablet Take 1 tablet (40 mg total) by mouth daily at 6 PM.   sacubitril-valsartan (ENTRESTO) 97-103 MG Take 1 tablet by mouth 2 (two) times daily.   spironolactone (ALDACTONE) 25 MG tablet Take 1 tablet (25 mg total) by mouth daily.   No current facility-administered medications for this visit. (Other)      REVIEW OF SYSTEMS: ROS   Positive for: Endocrine, Cardiovascular, Eyes Negative for: Constitutional, Gastrointestinal, Neurological, Skin, Genitourinary, Musculoskeletal, HENT, Respiratory, Psychiatric, Allergic/Imm, Heme/Lymph Last edited by Matthew Folks, COA on 02/08/2021 10:47 AM.       ALLERGIES No Known Allergies  PAST MEDICAL HISTORY Past Medical History:  Diagnosis Date   CAD in native artery 01/01/2021   Prior LAD PCI.  Currently  no symptoms of ischemia.  Encouraged him to do some light exercising.  Continue aspirin, carvedilol, and rosuvastatin.   Cataract    CKD (chronic kidney disease), stage III ([place]) 08/24/2020   Coronary artery disease    Diabetes mellitus without complication (HCC)    Diabetic retinopathy ([place])    Hernia, inguinal    Hypertension    Hypertensive retinopathy    NSVT (nonsustained ventricular tachycardia) ([place]) 08/24/2020   Uncontrolled type 2 diabetes mellitus ([place]) 11/11/2018   Past Surgical History:  Procedure Laterality Date   CORONARY STENT INTERVENTION N/A 11/15/2018   Procedure: CORONARY STENT INTERVENTION;  Surgeon: [name],  MD;  Location: [place] [facility];  Service: Cardiovascular;  Laterality: N/A;   HEMORROIDECTOMY     RIGHT HEART CATH N/A 07/18/2020   Procedure: RIGHT HEART CATH;  Surgeon: [name], MD;  Location: [place] [facility];  Service: Cardiovascular;  Laterality: N/A;   RIGHT/LEFT HEART CATH AND CORONARY ANGIOGRAPHY N/A 11/15/2018   Procedure: RIGHT/LEFT HEART CATH AND CORONARY ANGIOGRAPHY;  Surgeon: [name], MD;  Location: [place] [facility];  Service: Cardiovascular;  Laterality: N/A;    FAMILY HISTORY Family History  Problem Relation Age of Onset   Hypertension Mother    Diabetes Mellitus II Mother    Arrhythmia Mother    Heart disease Mother    Hypertension Sister    Hypertension Brother    Hypertension Other    Diabetes Mellitus II Other        All 9 sibblings have HTN    SOCIAL HISTORY Social History   Tobacco Use   Smoking status: Never   Smokeless tobacco: Never  Vaping Use   Vaping Use: Never used  Substance Use Topics   Alcohol use: Never   Drug use: Never         OPHTHALMIC EXAM:  Base Eye Exam     Visual Acuity (Snellen - Linear)       Right Left   Dist [place] 20/60 -2 20/150 -2   Dist ph [place] 20/40 -2 NI         Tonometry (Tonopen, 10:49 AM)       Right Left   Pressure 12 13         Pupils       Dark Light Shape React APD   Right 4 3 Round Minimal None   Left 4 3 Round Minimal None         Visual Fields (Counting fingers)       Left Right   Restrictions Partial outer superior nasal, inferior nasal deficiencies Partial outer superior temporal deficiency         Extraocular Movement       Right Left    Full, Ortho Full, Ortho         Neuro/Psych     Oriented x3: Yes   Mood/Affect: Normal         Dilation     Both eyes: 1.0% Mydriacyl, 2.5% Phenylephrine @ 10:50 AM           Slit Lamp and Fundus Exam     Slit Lamp Exam       Right Left   Lids/Lashes Dermatochalasis - upper lid Dermatochalasis  - upper lid   Conjunctiva/Sclera Melanosis Melanosis   Cornea arcus arcus   Anterior Chamber deep and clear deep and clear   Iris Round and dilated, No NVI Round and dilated, No NVI   Lens 2-3+ Nuclear  sclerosis, 2-3+ Cortical cataract 2-3+ Nuclear sclerosis, 2-3+ Cortical cataract   Vitreous Vitreous syneresis Vitreous syneresis         Fundus Exam       Right Left   Disc Pink and Sharp, no NVD Pink and Sharp, no NVD   C/D Ratio 0.6 0.6   Macula Blunted foveal reflex, central edema, scattered MA/exudate, +DBH Blunted foveal reflex, central edema, +DBH/exudates   Vessels attenuated, Tortuous, focal NV IN arcades   attenuated, Tortuous   Periphery Attached, 360 MA/DBH greatest posteriorly Attached, 360 DBH             IMAGING AND PROCEDURES  Imaging and Procedures for 02/08/2021  OCT, Retina - OU - Both Eyes       Right Eye Quality was good. Central Foveal Thickness: 314. Progression has improved. Findings include abnormal foveal contour, intraretinal fluid, intraretinal hyper-reflective material, vitreomacular adhesion , no SRF (Interval improvement in IRF, SRF and foveal contour).   Left Eye Quality was good. Central Foveal Thickness: 459. Progression has improved. Findings include abnormal foveal contour, intraretinal fluid, no SRF, intraretinal hyper-reflective material, vitreomacular adhesion (Interval improvement in IRF and foveal contour).   Notes *Images captured and stored on drive  Diagnosis / Impression:  Abnormal foveal contour and +DME OU OD: Interval improvement in IRF, SRF and foveal contour OS: interval improvement in IRF and foveal contour  Clinical management:  See below  Abbreviations: NFP - Normal foveal profile. CME - cystoid macular edema. PED - pigment epithelial detachment. IRF - intraretinal fluid. SRF - subretinal fluid. EZ - ellipsoid zone. ERM - epiretinal membrane. ORA - outer retinal atrophy. ORT - outer retinal tubulation. SRHM -  subretinal hyper-reflective material. IRHM - intraretinal hyper-reflective material      Panretinal Photocoagulation - OD - Right Eye       Time Out Confirmed correct patient, procedure, site, and patient consented.   Anesthesia Topical anesthesia was used. Anesthetic medications included Proparacaine 0.5%.   Post-op The patient tolerated the procedure well. There were no complications. The patient received written and verbal post procedure care education.   Notes LASER PROCEDURE NOTE  Diagnosis:   Proliferative Diabetic Retinopathy, RIGHT EYE  Procedure:  Pan-retinal photocoagulation using slit lamp laser, RIGHT EYE  Anesthesia:  Topical  Surgeon: [name], MD, PhD   Informed consent obtained, operative eye marked, and time out performed prior to initiation of laser.   Lumenis [id number] slit lamp laser Pattern: 3x3 square Power: 270 mW Duration: 30 msec  Spot size: 200 microns  # spots: 0962 spots   Complications: None.  Notes: significant cortical cataract obscuring view and preventing laser up take inferior temporal quadrant  RTC: 3-4 wks -- DFE, OCT, possible injections  Patient tolerated the procedure well and received written and verbal post-procedure care information/education.             ASSESSMENT/PLAN:    ICD-10-CM   1. Proliferative diabetic retinopathy of right eye with macular edema associated with type 2 diabetes mellitus ([place])  E36.6294 Panretinal Photocoagulation - OD - Right Eye    2. Severe nonproliferative diabetic retinopathy of left eye with macular edema associated with type 2 diabetes mellitus ([place])  T65.4650     3. Retinal edema  H35.81 OCT, Retina - OU - Both Eyes    4. Essential hypertension  I10     5. Hypertensive retinopathy of both eyes  H35.033     6. Combined forms of age-related cataract of both eyes  H25.813  1-3. Proliferative diabetic retinopathy, OD  - severe NPDR OS  - s/p IVA OU #1 (06.06.22) -  exam shows central edema OU, scattered IRH -- already improving - FA (06.06.22) shows OD: focal NVE IN arcades; OS: no NV OS; late leaking MA's OU -- will need PRP OD - OCT shows diabetic macular edema OU but early improvement OU - recommend PRP OD today, 06.10.22 - pt wishes to proceed with laser - RBA of procedure discussed, questions answered - informed consent obtained and signed - see procedure note - start PF QID OD x7 days - IVA informed consent obtained and signed (OU), 06.06.22 - f/u wk of July 4 for DFE/OCT, possible injection(s)  4,5. Hypertensive retinopathy OU - discussed importance of tight BP control - monitor  6. Mixed Cataract OU - The symptoms of cataract, surgical options, and treatments and risks were discussed with patient. - discussed diagnosis and progression - not yet visually significant - monitor for now    Ophthalmic Meds Ordered this visit:  No orders of the defined types were placed in this encounter.      Return in about 4 weeks (around 03/08/2021) for f/u 4 weeks, PDR OU, DFE, OCT.  There are no Patient Instructions on file for this visit.  This document serves as a record of services personally performed by Gardiner Sleeper, MD, PhD. It was created on their behalf by San Jetty. Owens Shark, OA an ophthalmic technician. The creation of this record is the provider's dictation and/or activities during the visit.    Electronically signed by: San Jetty. Owens Shark, [place] 06.07.2022 12:41 PM.  Gardiner Sleeper, M.D., Ph.D. Diseases & Surgery of the Retina and [place] 02/08/2021   I have reviewed the above documentation for accuracy and completeness, and I agree with the above. Gardiner Sleeper, M.D., Ph.D. 02/08/21 12:41 PM   Abbreviations: M myopia (nearsighted); A astigmatism; H hyperopia (farsighted); P presbyopia; Mrx spectacle prescription;  CTL contact lenses; OD right eye; OS left eye; OU both eyes  XT exotropia; ET  esotropia; PEK punctate epithelial keratitis; PEE punctate epithelial erosions; DES dry eye syndrome; MGD meibomian gland dysfunction; ATs artificial tears; PFAT's preservative free artificial tears; [place] nuclear sclerotic cataract; PSC posterior subcapsular cataract; ERM epi-retinal membrane; PVD posterior vitreous detachment; RD retinal detachment; DM diabetes mellitus; DR diabetic retinopathy; NPDR non-proliferative diabetic retinopathy; PDR proliferative diabetic retinopathy; CSME clinically significant macular edema; DME diabetic macular edema; dbh dot blot hemorrhages; CWS cotton wool spot; POAG primary open angle glaucoma; C/D cup-to-disc ratio; HVF humphrey visual field; GVF goldmann visual field; OCT optical coherence tomography; IOP intraocular pressure; BRVO Branch retinal vein occlusion; CRVO central retinal vein occlusion; CRAO central retinal artery occlusion; BRAO branch retinal artery occlusion; RT retinal tear; SB scleral buckle; PPV pars plana vitrectomy; VH Vitreous hemorrhage; PRP panretinal laser photocoagulation; IVK intravitreal kenalog; VMT vitreomacular traction; MH Macular hole;  NVD neovascularization of the disc; NVE neovascularization elsewhere; AREDS age related eye disease study; ARMD age related macular degeneration; POAG primary open angle glaucoma; EBMD epithelial/anterior basement membrane dystrophy; ACIOL anterior chamber intraocular lens; IOL intraocular lens; PCIOL posterior chamber intraocular lens; Phaco/IOL phacoemulsification with intraocular lens placement; [place] photorefractive keratectomy; LASIK laser assisted in situ keratomileusis; HTN hypertension; DM diabetes mellitus; COPD chronic obstructive pulmonary disease

## 2021-02-08 ENCOUNTER — Ambulatory Visit (INDEPENDENT_AMBULATORY_CARE_PROVIDER_SITE_OTHER): Payer: Medicare Other | Admitting: Ophthalmology

## 2021-02-08 ENCOUNTER — Other Ambulatory Visit: Payer: Self-pay

## 2021-02-08 ENCOUNTER — Encounter (INDEPENDENT_AMBULATORY_CARE_PROVIDER_SITE_OTHER): Payer: Self-pay | Admitting: Ophthalmology

## 2021-02-08 ENCOUNTER — Telehealth: Payer: Self-pay | Admitting: *Deleted

## 2021-02-08 DIAGNOSIS — H3581 Retinal edema: Secondary | ICD-10-CM

## 2021-02-08 DIAGNOSIS — H25813 Combined forms of age-related cataract, bilateral: Secondary | ICD-10-CM

## 2021-02-08 DIAGNOSIS — I10 Essential (primary) hypertension: Secondary | ICD-10-CM

## 2021-02-08 DIAGNOSIS — E113412 Type 2 diabetes mellitus with severe nonproliferative diabetic retinopathy with macular edema, left eye: Secondary | ICD-10-CM | POA: Diagnosis not present

## 2021-02-08 DIAGNOSIS — E113511 Type 2 diabetes mellitus with proliferative diabetic retinopathy with macular edema, right eye: Secondary | ICD-10-CM

## 2021-02-08 DIAGNOSIS — H35033 Hypertensive retinopathy, bilateral: Secondary | ICD-10-CM

## 2021-02-08 NOTE — Telephone Encounter (Signed)
Received letter from patients insurance regarding [place] stating that drug is not on our drug list. They will not continue to cover unless an exemption from plan   Will forward to Wonda Horner LPN for assistance

## 2021-02-11 NOTE — Telephone Encounter (Signed)
**Note De-Identified Earl Calderon Obfuscation** I started a [place] PA through covermymeds. Key: [id number]

## 2021-02-15 ENCOUNTER — Encounter (INDEPENDENT_AMBULATORY_CARE_PROVIDER_SITE_OTHER): Payer: Self-pay | Admitting: Ophthalmology

## 2021-02-19 ENCOUNTER — Telehealth: Payer: Self-pay | Admitting: *Deleted

## 2021-02-19 NOTE — Telephone Encounter (Signed)
Per our EF ( < 35%) guidelines, this patient will need to be done at the hospital.  Does he need an office visit first? Thanks!

## 2021-02-20 ENCOUNTER — Other Ambulatory Visit: Payer: Self-pay

## 2021-02-20 ENCOUNTER — Telehealth: Payer: Self-pay | Admitting: *Deleted

## 2021-02-20 ENCOUNTER — Ambulatory Visit (HOSPITAL_COMMUNITY): Payer: Medicare Other | Attending: Cardiology

## 2021-02-20 DIAGNOSIS — I5043 Acute on chronic combined systolic (congestive) and diastolic (congestive) heart failure: Secondary | ICD-10-CM | POA: Insufficient documentation

## 2021-02-20 LAB — ECHOCARDIOGRAM COMPLETE
Area-P 1/2: 2.62 cm2
S' Lateral: 3 cm

## 2021-02-20 NOTE — Telephone Encounter (Signed)
Patient was seen 5/3, Echo was to be scheduled in 2 months  Patient scheduled for today, ok to get per Dr Oval Linsey

## 2021-02-21 NOTE — Telephone Encounter (Signed)
Dr Cathleen Corti, I think you can disregard my previous  message.  Patient had another echo 02/20/21  with improvement of his EF to 40-45% and no aorta stenosis.

## 2021-02-28 ENCOUNTER — Ambulatory Visit (AMBULATORY_SURGERY_CENTER): Payer: Medicaid Other

## 2021-02-28 ENCOUNTER — Other Ambulatory Visit: Payer: Self-pay

## 2021-02-28 VITALS — Ht 70.0 in | Wt 141.0 lb

## 2021-02-28 DIAGNOSIS — Z1211 Encounter for screening for malignant neoplasm of colon: Secondary | ICD-10-CM

## 2021-02-28 MED ORDER — NA SULFATE-K SULFATE-MG SULF 17.5-3.13-1.6 GM/177ML PO SOLN: 1.0000 | Freq: Once | ORAL | 0 refills | Status: AC

## 2021-02-28 NOTE — Progress Notes (Signed)
No egg or soy allergy known to patient  No issues with past sedation with any surgeries or procedures Patient denies ever being told they had issues or difficulty with intubation  No FH of Malignant Hyperthermia No diet pills per patient No home 02 use per patient  No blood thinners per patient  Pt denies issues with constipation at this time but does report he "toggles between constipation and diarrhea"; patient advised to start Miralax 1/2-1 capful daily x 5 days prior to prep to assist with constipation; No A fib or A flutter  Hx of L BBB, NSVT, CAD, HF Colonoscopy pamphlet given to patient during pre visit appt;  COVID 19 guidelines implemented in PV today with Pt and RN   NO PA's for preps discussed with pt in PV today  Discussed with pt there will be an out-of-pocket cost for prep and that varies from $0 to 70 dollars   Due to the COVID-19 pandemic we are asking patients to follow certain guidelines.  Pt aware of COVID protocols and [facility] guidelines

## 2021-03-07 ENCOUNTER — Other Ambulatory Visit (HOSPITAL_BASED_OUTPATIENT_CLINIC_OR_DEPARTMENT_OTHER): Payer: Self-pay | Admitting: Family Medicine

## 2021-03-07 ENCOUNTER — Inpatient Hospital Stay (HOSPITAL_BASED_OUTPATIENT_CLINIC_OR_DEPARTMENT_OTHER): Admission: RE | Admit: 2021-03-07 | Payer: Medicaid Other | Source: Ambulatory Visit

## 2021-03-07 DIAGNOSIS — R221 Localized swelling, mass and lump, neck: Secondary | ICD-10-CM

## 2021-03-07 NOTE — Progress Notes (Signed)
[facility] Note  03/08/2021     CHIEF COMPLAINT Patient presents for Retina Follow Up   HISTORY OF PRESENT ILLNESS: Earl Calderon is a 70 y.o. male who presents to the clinic today for:   HPI     Retina Follow Up   Patient presents with  Diabetic Retinopathy.  In both eyes.  This started weeks ago.  Severity is moderate.  Duration of 4 weeks.  Since onset it is gradually improving.  I, the attending physician,  performed the HPI with the patient and updated documentation appropriately.        Comments   70 y/o male pt here for f/u for DR OU.  VA seems to be gradually improving OU.  Denies pain, FOL, floaters.  At random times he sees what looks like a "white drop" in his peripheral vision OD.  Goes away quickly on its own.  S/p PRP OD 6.10.22.  No gtts.  BS and A1C unknown.      Last edited by [name], MD on 03/11/2021 11:25 PM.     pt states no problems after Calderon procedure at last visit, he states he sees a "white tear" in the temporal side of his right eye vision, he used PF QID for 7 days as directed  Referring physician: Gildardo Pounds, NP [place],  [place] [address]  HISTORICAL INFORMATION:   Selected notes from the MEDICAL RECORD NUMBER Referred by Dr. [name] for severe NPDR OU LEE:  Ocular Hx- PMH-    CURRENT MEDICATIONS: No current outpatient medications on file. (Ophthalmic Drugs)   No current facility-administered medications for this visit. (Ophthalmic Drugs)   Current Outpatient Medications (Other)  Medication Sig   APPLE CIDER VINEGAR PO Take 1 tablet by mouth daily at 6 (six) AM.   aspirin EC 81 MG tablet Take 1 tablet (81 mg total) by mouth daily. Swallow whole.   dapagliflozin propanediol (FARXIGA) 10 MG TABS tablet Take 1 tablet (10 mg total) by mouth daily before breakfast.   ELDERBERRY PO Take 100 mg by mouth daily.   ferrous sulfate 325 (65 FE) MG tablet Take 1 tablet (325 mg total) by  mouth daily with breakfast.   furosemide (LASIX) 40 MG tablet Take 1 tablet (40 mg total) by mouth daily.   glucose blood (FREESTYLE TEST STRIPS) test strip Use as instructed   insulin aspart (NOVOLOG FLEXPEN) 100 UNIT/ML FlexPen Before each meal 3 times a day, 140-199 - 2 units, 200-250 - 4 units, 251-299 - 6 units,  300-349 - 8 units,  350 or above 10 units. Insulin PEN if approved, provide syringes and needles if needed.   Insulin Pen Needle (NOVOFINE PEN NEEDLE) 32G X 6 MM MISC USE AS DIRECTED   LEVEMIR FLEXTOUCH 100 UNIT/ML FlexPen Inject 8 Units into the skin 2 (two) times daily.   Probiotic Product (PROBIOTIC MULTI-ENZYME) TABS Take 3 tablets by mouth daily at 6 (six) AM.   sacubitril-valsartan (ENTRESTO) 97-103 MG Take 1 tablet by mouth 2 (two) times daily.   spironolactone (ALDACTONE) 25 MG tablet Take 1 tablet (25 mg total) by mouth daily.   Turmeric 500 MG TABS Take 2 tablets by mouth daily at 6 (six) AM.   carvedilol (COREG) 25 MG tablet Take 1 tablet (25 mg total) by mouth 2 (two) times daily with a meal.   nitroGLYCERIN (NITROSTAT) 0.4 MG SL tablet Place 1 tablet (0.4 mg total) under the tongue every 5 (  five) minutes as needed for chest pain.   rosuvastatin (CRESTOR) 40 MG tablet Take 1 tablet (40 mg total) by mouth daily at 6 PM.   No current facility-administered medications for this visit. (Other)      REVIEW OF SYSTEMS: ROS   Positive for: Endocrine, Cardiovascular, Eyes Negative for: Constitutional, Gastrointestinal, Neurological, Skin, Genitourinary, Musculoskeletal, HENT, Respiratory, Psychiatric, Allergic/Imm, Heme/Lymph Last edited by Matthew Folks, COA on 03/08/2021  2:28 PM.        ALLERGIES No Known Allergies  PAST MEDICAL HISTORY Past Medical History:  Diagnosis Date   CAD in native artery 01/01/2021   Prior LAD PCI.  Currently no symptoms of ischemia.  Encouraged him to do some light exercising.  Continue aspirin, carvedilol, and rosuvastatin.    Cataract    CKD (chronic kidney disease), stage III ([place]) 08/24/2020   Coronary artery disease    Diabetes mellitus without complication (HCC)    on meds   Diabetic retinopathy ([place])    Hernia, inguinal    Hypertension    on meds   Hypertensive retinopathy    NSVT (nonsustained ventricular tachycardia) ([place]) 08/24/2020   Uncontrolled type 2 diabetes mellitus ([place]) 11/11/2018   Past Surgical History:  Procedure Laterality Date   CORONARY STENT INTERVENTION N/A 11/15/2018   Procedure: CORONARY STENT INTERVENTION;  Surgeon: [name], MD;  Location: [facility];  Service: Cardiovascular;  Laterality: N/A;   HEMORROIDECTOMY     RIGHT HEART CATH N/A 07/18/2020   Procedure: RIGHT HEART CATH;  Surgeon: [name], MD;  Location: [facility];  Service: Cardiovascular;  Laterality: N/A;   RIGHT/LEFT HEART CATH AND CORONARY ANGIOGRAPHY N/A 11/15/2018   Procedure: RIGHT/LEFT HEART CATH AND CORONARY ANGIOGRAPHY;  Surgeon: [name], MD;  Location: [facility];  Service: Cardiovascular;  Laterality: N/A;   WISDOM TOOTH EXTRACTION      FAMILY HISTORY Family History  Problem Relation Age of Onset   Hypertension Mother    Diabetes Mellitus II Mother    Arrhythmia Mother    Heart disease Mother    Hypertension Sister    Hypertension Brother    Hypertension Other    Diabetes Mellitus II Other        All 9 sibblings have HTN   Colon cancer Neg Hx    Colon polyps Neg Hx    Esophageal cancer Neg Hx    Stomach cancer Neg Hx    Rectal cancer Neg Hx     SOCIAL HISTORY Social History   Tobacco Use   Smoking status: Never   Smokeless tobacco: Never  Vaping Use   Vaping Use: Never used  Substance Use Topics   Alcohol use: Not Currently   Drug use: Never         OPHTHALMIC EXAM:  Base Eye Exam     Visual Acuity (Snellen - Linear)       Right Left   Dist [place] 20/40 -2 20/100 -2   Dist ph [place] 20/30 -2 NI         Tonometry  (Tonopen, 2:32 PM)       Right Left   Pressure 12 13         Pupils       Dark Light Shape React APD   Right 4 3 Round Minimal None   Left 4 3 Round Minimal None         Visual Fields (Counting fingers)  Left Right   Restrictions Partial outer superior nasal deficiency Partial outer superior temporal deficiency         Extraocular Movement       Right Left    Full, Ortho Full, Ortho         Neuro/Psych     Oriented x3: Yes   Mood/Affect: Normal         Dilation     Both eyes: 1.0% Mydriacyl, 2.5% Phenylephrine @ 2:32 PM           Slit Lamp and Fundus Exam     Slit Lamp Exam       Right Left   Lids/Lashes Dermatochalasis - upper lid Dermatochalasis - upper lid   Conjunctiva/Sclera Melanosis Melanosis   Cornea arcus arcus   Anterior Chamber deep and clear deep and clear   Iris Round and dilated, No NVI Round and dilated, No NVI   Lens 2-3+ Nuclear sclerosis, 2-3+ Cortical cataract 2-3+ Nuclear sclerosis, 2-3+ Cortical cataract   Vitreous Vitreous syneresis Vitreous syneresis         Fundus Exam       Right Left   Disc Pink and Sharp, no NVD Pink and Sharp, no NVD   C/D Ratio 0.6 0.6   Macula Blunted foveal reflex, central edema, scattered MA/exudate, +DBH good foveal reflex, scattered MA/DBH   Vessels attenuated, Tortuous, focal NV IN arcades   attenuated, Tortuous   Periphery Attached, 360 MA/DBH greatest posteriorly, good early 360 PRP changes Attached, 360 DBH             IMAGING AND PROCEDURES  Imaging and Procedures for 03/08/2021  OCT, Retina - OU - Both Eyes       Right Eye Quality was good. Central Foveal Thickness: 324. Progression has worsened. Findings include abnormal foveal contour, intraretinal fluid, intraretinal hyper-reflective material, vitreomacular adhesion , no SRF (Interval increase in IRF greatest nasal mac).   Left Eye Quality was good. Central Foveal Thickness: 275. Progression has improved. Findings  include intraretinal fluid, no SRF, intraretinal hyper-reflective material, vitreomacular adhesion , normal foveal contour (Interval improvement in IRF and foveal contour).   Notes *Images captured and stored on drive  Diagnosis / Impression:  Abnormal foveal contour and +DME OU OD: Interval increase in IRF greatest nasal mac OS: interval improvement in IRF and foveal contour  Clinical management:  See below  Abbreviations: NFP - Normal foveal profile. CME - cystoid macular edema. PED - pigment epithelial detachment. IRF - intraretinal fluid. SRF - subretinal fluid. EZ - ellipsoid zone. ERM - epiretinal membrane. ORA - outer retinal atrophy. ORT - outer retinal tubulation. SRHM - subretinal hyper-reflective material. IRHM - intraretinal hyper-reflective material      Intravitreal Injection, Pharmacologic Agent - OD - Right Eye       Time Out 03/08/2021. 3:11 PM. Confirmed correct patient, procedure, site, and patient consented.   Anesthesia Topical anesthesia was used. Anesthetic medications included Lidocaine 2%, Proparacaine 0.5%.   Procedure Preparation included 5% betadine to ocular surface, eyelid speculum. A supplied needle was used.   Injection: 1.25 mg Bevacizumab 1.73m/0.05ml   Route: Intravitreal, Site: Right Eye   NDC: 5H061816 Lot:: [id number] Expiration date: 04/20/2021, Waste: 0 mL   Post-op Post injection exam found visual acuity of at least counting fingers. The patient tolerated the procedure well. There were no complications. The patient received written and verbal post procedure care education. Post injection medications were not given.      Intravitreal Injection, Pharmacologic Agent -  OS - Left Eye       Time Out 03/08/2021. 3:11 PM. Confirmed correct patient, procedure, site, and patient consented.   Anesthesia Topical anesthesia was used. Anesthetic medications included Lidocaine 2%, Proparacaine 0.5%.   Procedure Preparation included 5% betadine  to ocular surface, eyelid speculum. A (32g) needle was used.   Injection: 1.25 mg Bevacizumab 1.71m/0.05ml   Route: Intravitreal, Site: Left Eye   NDC: 5H061816 Lot:: [id number] Expiration date: 04/12/2021, Waste: 0.05 mL   Post-op Post injection exam found visual acuity of at least counting fingers. The patient tolerated the procedure well. There were no complications. The patient received written and verbal post procedure care education. Post injection medications were not given.               ASSESSMENT/PLAN:    ICD-10-CM   1. Proliferative diabetic retinopathy of right eye with macular edema associated with type 2 diabetes mellitus (HCC)  EY70.6237Intravitreal Injection, Pharmacologic Agent - OD - Right Eye    Bevacizumab (AVASTIN) SOLN 1.25 mg    2. Severe nonproliferative diabetic retinopathy of left eye with macular edema associated with type 2 diabetes mellitus (HCC)  ES28.3151Intravitreal Injection, Pharmacologic Agent - OS - Left Eye    Bevacizumab (AVASTIN) SOLN 1.25 mg    3. Retinal edema  H35.81 OCT, Retina - OU - Both Eyes    4. Essential hypertension  I10     5. Hypertensive retinopathy of both eyes  H35.033     6. Combined forms of age-related cataract of both eyes  H25.813       1-3. Proliferative diabetic retinopathy, OD  - severe NPDR OS  - s/p IVA OU #1 (06.06.22)  - s/p PRP OD (06.10.22) - exam shows central edema OU, scattered IRH -- improving - FA (06.06.22) shows OD: focal NVE IN arcades; OS: no NV OS; late leaking MA's OU -- s/p PRP OD 6.10.22 - OCT shows diabetic macular edema OU but early improvement OU - recommend IVA OU #2 today, 07.08.22 - pt wishes to proceed - RBA of procedure discussed, questions answered - informed consent obtained and signed - see procedure note - IVA informed consent obtained and signed (OU), 06.06.22 - f/u 4 weeks for DFE/OCT, possible injection(s)  4,5. Hypertensive retinopathy OU - discussed importance of  tight BP control - monitor  6. Mixed Cataract OU - The symptoms of cataract, surgical options, and treatments and risks were discussed with patient. - discussed diagnosis and progression - not yet visually significant - monitor for now  Ophthalmic Meds Ordered this visit:  Meds ordered this encounter  Medications   Bevacizumab (AVASTIN) SOLN 1.25 mg   Bevacizumab (AVASTIN) SOLN 1.25 mg      Return in about 4 weeks (around 04/05/2021) for f/u PDR OU, DFE, OCT.  There are no Patient Instructions on file for this visit.  This document serves as a record of services personally performed by BGardiner Sleeper MD, PhD. It was created on their behalf by ASan Jetty BOwens Shark OA an ophthalmic technician. The creation of this record is the provider's dictation and/or activities during the visit.    Electronically signed by: ASan Jetty BMarguerita Merles07.07.2022 11:27 PM   BGardiner Sleeper M.D., Ph.D. Diseases & Surgery of the Retina and Vitreous [facility] I have reviewed the above documentation for accuracy and completeness, and I agree with the above. BGardiner Sleeper M.D., Ph.D. 03/11/21 11:29 PM   Abbreviations: MJerilynn Magesmyopia (  nearsighted); A astigmatism; H hyperopia (farsighted); P presbyopia; Mrx spectacle prescription;  CTL contact lenses; OD right eye; OS left eye; OU both eyes  XT exotropia; ET esotropia; PEK punctate epithelial keratitis; PEE punctate epithelial erosions; DES dry eye syndrome; MGD meibomian gland dysfunction; ATs artificial tears; PFAT's preservative free artificial tears; [place] nuclear sclerotic cataract; PSC posterior subcapsular cataract; ERM epi-retinal membrane; PVD posterior vitreous detachment; RD retinal detachment; DM diabetes mellitus; DR diabetic retinopathy; NPDR non-proliferative diabetic retinopathy; PDR proliferative diabetic retinopathy; CSME clinically significant macular edema; DME diabetic macular edema; dbh dot blot hemorrhages; CWS cotton  wool spot; POAG primary open angle glaucoma; C/D cup-to-disc ratio; HVF humphrey visual field; GVF goldmann visual field; OCT optical coherence tomography; IOP intraocular pressure; BRVO Branch retinal vein occlusion; CRVO central retinal vein occlusion; CRAO central retinal artery occlusion; BRAO branch retinal artery occlusion; RT retinal tear; SB scleral buckle; PPV pars plana vitrectomy; VH Vitreous hemorrhage; PRP panretinal Calderon photocoagulation; IVK intravitreal kenalog; VMT vitreomacular traction; MH Macular hole;  NVD neovascularization of the disc; NVE neovascularization elsewhere; AREDS age related eye disease study; ARMD age related macular degeneration; POAG primary open angle glaucoma; EBMD epithelial/anterior basement membrane dystrophy; ACIOL anterior chamber intraocular lens; IOL intraocular lens; PCIOL posterior chamber intraocular lens; Phaco/IOL phacoemulsification with intraocular lens placement; [place] photorefractive keratectomy; LASIK Calderon assisted in situ keratomileusis; HTN hypertension; DM diabetes mellitus; COPD chronic obstructive pulmonary disease

## 2021-03-08 ENCOUNTER — Encounter (INDEPENDENT_AMBULATORY_CARE_PROVIDER_SITE_OTHER): Payer: Self-pay | Admitting: Ophthalmology

## 2021-03-08 ENCOUNTER — Telehealth: Payer: Self-pay | Admitting: Cardiovascular Disease

## 2021-03-08 ENCOUNTER — Other Ambulatory Visit: Payer: Self-pay

## 2021-03-08 ENCOUNTER — Ambulatory Visit (INDEPENDENT_AMBULATORY_CARE_PROVIDER_SITE_OTHER): Payer: Medicare Other | Admitting: Ophthalmology

## 2021-03-08 DIAGNOSIS — I10 Essential (primary) hypertension: Secondary | ICD-10-CM

## 2021-03-08 DIAGNOSIS — E113412 Type 2 diabetes mellitus with severe nonproliferative diabetic retinopathy with macular edema, left eye: Secondary | ICD-10-CM | POA: Diagnosis not present

## 2021-03-08 DIAGNOSIS — H3581 Retinal edema: Secondary | ICD-10-CM | POA: Diagnosis not present

## 2021-03-08 DIAGNOSIS — E113511 Type 2 diabetes mellitus with proliferative diabetic retinopathy with macular edema, right eye: Secondary | ICD-10-CM | POA: Diagnosis not present

## 2021-03-08 DIAGNOSIS — H35033 Hypertensive retinopathy, bilateral: Secondary | ICD-10-CM

## 2021-03-08 DIAGNOSIS — H25813 Combined forms of age-related cataract, bilateral: Secondary | ICD-10-CM

## 2021-03-08 NOTE — Telephone Encounter (Signed)
   Pt c/o BP issue: STAT if pt c/o blurred vision, one-sided weakness or slurred speech  1. What are your last 5 BP readings? 133/56, 113/54  2. Are you having any other symptoms (ex. Dizziness, headache, blurred vision, passed out)? Dizziness, fatigue and SOB  3. What is your BP issue? Pt said he's been having low BP and been feeling very dizzy at times and very tired all the time, he wanted to check in with Dr. Oval Linsey if he needs to change some of his medications, he said to call him, he didn't answer to call his wife at [phone]

## 2021-03-08 NOTE — Telephone Encounter (Signed)
Left message to call back.   Continues to go straight to voicemail

## 2021-03-08 NOTE — Telephone Encounter (Signed)
Pt is returning call.  

## 2021-03-08 NOTE — Telephone Encounter (Signed)
Left message to call back, will try wife's phone as well.   Left message with both phones numbers. Both went straight to voicemail

## 2021-03-08 NOTE — Telephone Encounter (Signed)
Spoke to patient he stated his B/P has been low the past 2 days.B/P ranging 116/54,113/66 pulse 69,53.Stated he has been having irregular heart beat at night.He is dizzy, but he is always dizzy.Dizziness is no worse.He has been unable to check blood sugar.Stated when he sticks finger he cannot get finger to bleed.Advised he needs to call PCP.Advised B/P is ok.B/P needs to be above 630 systolic. Advised continue to monitor B/P.Advised Dr.Westover is out of office.I will send message to her.

## 2021-03-11 ENCOUNTER — Encounter (INDEPENDENT_AMBULATORY_CARE_PROVIDER_SITE_OTHER): Payer: Self-pay | Admitting: Ophthalmology

## 2021-03-11 MED ORDER — BEVACIZUMAB CHEMO INJECTION 1.25MG/0.05ML SYRINGE FOR KALEIDOSCOPE
1.2500 mg | INTRAVITREAL | Status: AC | PRN
  Administered 2021-03-08: 1.25 mg via INTRAVITREAL

## 2021-03-14 NOTE — Telephone Encounter (Signed)
Discussed with Dr Oval Linsey and she would like patient to wear a 3 day zio Tried to call patient, voicemail full Mychart message sent to patient to call office to follow up

## 2021-03-15 ENCOUNTER — Other Ambulatory Visit: Payer: Self-pay

## 2021-03-15 MED ORDER — FERROUS SULFATE 325 (65 FE) MG PO TABS: 325.0000 mg | ORAL_TABLET | Freq: Every day | ORAL | 3 refills | Status: DC

## 2021-03-21 ENCOUNTER — Other Ambulatory Visit: Payer: Self-pay

## 2021-03-21 ENCOUNTER — Ambulatory Visit (AMBULATORY_SURGERY_CENTER): Payer: Medicare Other | Admitting: Gastroenterology

## 2021-03-21 ENCOUNTER — Encounter: Payer: Self-pay | Admitting: Gastroenterology

## 2021-03-21 VITALS — BP 170/84 | HR 66 | Temp 97.7°F | Resp 12 | Ht 70.0 in | Wt 141.0 lb

## 2021-03-21 DIAGNOSIS — D124 Benign neoplasm of descending colon: Secondary | ICD-10-CM | POA: Diagnosis not present

## 2021-03-21 DIAGNOSIS — D122 Benign neoplasm of ascending colon: Secondary | ICD-10-CM | POA: Diagnosis not present

## 2021-03-21 DIAGNOSIS — D125 Benign neoplasm of sigmoid colon: Secondary | ICD-10-CM | POA: Diagnosis not present

## 2021-03-21 DIAGNOSIS — Z1211 Encounter for screening for malignant neoplasm of colon: Secondary | ICD-10-CM | POA: Diagnosis present

## 2021-03-21 DIAGNOSIS — K5730 Diverticulosis of large intestine without perforation or abscess without bleeding: Secondary | ICD-10-CM

## 2021-03-21 MED ORDER — SODIUM CHLORIDE 0.9 % IV SOLN: 500.0000 mL | Freq: Once | INTRAVENOUS | Status: DC

## 2021-03-21 NOTE — Patient Instructions (Addendum)
Handouts given for polyps and diverticulosis.  YOU HAD AN ENDOSCOPIC PROCEDURE TODAY AT THE [facility]:   Refer to the procedure report that was given to you for any specific questions about what was found during the examination.  If the procedure report does not answer your questions, please call your gastroenterologist to clarify.  If you requested that your care partner not be given the details of your procedure findings, then the procedure report has been included in a sealed envelope for you to review at your convenience later.  YOU SHOULD EXPECT: Some feelings of bloating in the abdomen. Passage of more gas than usual.  Walking can help get rid of the air that was put into your GI tract during the procedure and reduce the bloating. If you had a lower endoscopy (such as a colonoscopy or flexible sigmoidoscopy) you may notice spotting of blood in your stool or on the toilet paper. If you underwent a bowel prep for your procedure, you may not have a normal bowel movement for a few days.  Please Note:  You might notice some irritation and congestion in your nose or some drainage.  This is from the oxygen used during your procedure.  There is no need for concern and it should clear up in a day or so.  SYMPTOMS TO REPORT IMMEDIATELY:  Following lower endoscopy (colonoscopy or flexible sigmoidoscopy):  Excessive amounts of blood in the stool  Significant tenderness or worsening of abdominal pains  Swelling of the abdomen that is new, acute  Fever of 100F or higher  For urgent or emergent issues, a gastroenterologist can be reached at any hour by calling [phone]. Do not use MyChart messaging for urgent concerns.    DIET:  We do recommend a small meal at first, but then you may proceed to your regular diet.  Drink plenty of fluids but you should avoid alcoholic beverages for 24 hours.  ACTIVITY:  You should plan to take it easy for the rest of today and you should NOT DRIVE  or use heavy machinery until tomorrow (because of the sedation medicines used during the test).    FOLLOW UP: Our staff will call the number listed on your records 48-72 hours following your procedure to check on you and address any questions or concerns that you may have regarding the information given to you following your procedure. If we do not reach you, we will leave a message.  We will attempt to reach you two times.  During this call, we will ask if you have developed any symptoms of COVID 19. If you develop any symptoms (ie: fever, flu-like symptoms, shortness of breath, cough etc.) before then, please call [phone].  If you test positive for Covid 19 in the 2 weeks post procedure, please call and report this information to us.    If any biopsies were taken you will be contacted by phone or by letter within the next 1-3 weeks.  Please call us at [phone] if you have not heard about the biopsies in 3 weeks.    SIGNATURES/CONFIDENTIALITY: You and/or your care partner have signed paperwork which will be entered into your electronic medical record.  These signatures attest to the fact that that the information above on your After Visit Summary has been reviewed and is understood.  Full responsibility of the confidentiality of this discharge information lies with you and/or your care-partner.  

## 2021-03-21 NOTE — Op Note (Signed)
[place] Patient Name: Earl Calderon Procedure Date: 03/21/2021 10:44 AM MRN: [id number] Endoscopist: Gerrit Heck , MD Age: 70 Referring MD:  Date of Birth: 08-31-1951 Gender: Male Account #: [id number] Procedure:                Colonoscopy Indications:              Screening for colorectal malignant neoplasm, This                            is the patient's first colonoscopy Medicines:                Monitored Anesthesia Care Procedure:                Pre-Anesthesia Assessment:                           - Prior to the procedure, a History and Physical                            was performed, and patient medications and                            allergies were reviewed. The patient's tolerance of                            previous anesthesia was also reviewed. The risks                            and benefits of the procedure and the sedation                            options and risks were discussed with the patient.                            All questions were answered, and informed consent                            was obtained. Prior Anticoagulants: The patient has                            taken no previous anticoagulant or antiplatelet                            agents except for aspirin. ASA Grade Assessment:                            III - A patient with severe systemic disease. After                            reviewing the risks and benefits, the patient was                            deemed in satisfactory condition to undergo the  procedure.                           After obtaining informed consent, the colonoscope                            was passed under direct vision. Throughout the                            procedure, the patient's blood pressure, pulse, and                            oxygen saturations were monitored continuously. The                            Olympus CF-HQ190L [id number]) Colonoscope was                             introduced through the anus and advanced to the the                            terminal ileum. The colonoscopy was performed                            without difficulty. The patient tolerated the                            procedure well. The quality of the bowel                            preparation was good. The terminal ileum, ileocecal                            valve, appendiceal orifice, and rectum were                            photographed. Scope In: 10:47:11 AM Scope Out: 11:12:57 AM Scope Withdrawal Time: 0 hours 19 minutes 56 seconds  Total Procedure Duration: 0 hours 25 minutes 46 seconds  Findings:                 The perianal and digital rectal examinations were                            normal.                           Three sessile polyps were found in the sigmoid                            colon, descending colon and ascending colon. The                            polyps were 3 to 6 mm in size. These polyps were  removed with a cold snare. Resection and retrieval                            were complete. Estimated blood loss was minimal.                           A few small-mouthed diverticula were found in the                            ascending colon.                           The retroflexed view of the distal rectum and anal                            verge was normal and showed no anal or rectal                            abnormalities.                           The terminal ileum appeared normal. Complications:            No immediate complications. Estimated Blood Loss:     Estimated blood loss was minimal. Impression:               - Three 3 to 6 mm polyps in the sigmoid colon, in                            the descending colon and in the ascending colon,                            removed with a cold snare. Resected and retrieved.                           - Diverticulosis in the ascending colon.                            - The distal rectum and anal verge are normal on                            retroflexion view.                           - The examined portion of the ileum was normal. Recommendation:           - Patient has a contact number available for                            emergencies. The signs and symptoms of potential                            delayed complications were discussed with the  patient. Return to normal activities tomorrow.                            Written discharge instructions were provided to the                            patient.                           - Resume previous diet.                           - Repeat colonoscopy for surveillance based on                            pathology results.                           - Return to GI office PRN. Gerrit Heck, MD 03/21/2021 11:17:18 AM

## 2021-03-21 NOTE — Progress Notes (Signed)
Called to room to assist during endoscopic procedure.  Patient ID and intended procedure confirmed with present staff. Received instructions for my participation in the procedure from the performing physician.  

## 2021-03-21 NOTE — Progress Notes (Signed)
VS by [place]  I have reviewed the patient's medical history in detail and updated the computerized patient record.  

## 2021-03-21 NOTE — Progress Notes (Signed)
Report to PACU, RN, vss, BBS= Clear.  

## 2021-03-25 ENCOUNTER — Telehealth: Payer: Self-pay

## 2021-03-25 NOTE — Telephone Encounter (Signed)
  Follow up Call-  Call back number 03/21/2021  Post procedure Call Back phone  # [phone]  Permission to leave phone message No  Some recent data might be hidden    2nd follow up call made.  NALM

## 2021-03-25 NOTE — Telephone Encounter (Signed)
First attempt follow up call to pt, lm for pt to call if having any problems or questions, otherwise we will call them back later this morning or early this afternoon.  °

## 2021-03-25 NOTE — Telephone Encounter (Signed)
Left message to call back  Last read by [name] at 12:41 AM on 03/19/2021. Above copied from [employer]

## 2021-03-26 NOTE — Telephone Encounter (Signed)
Still no return call at this time, encounter closed

## 2021-03-27 ENCOUNTER — Telehealth: Payer: Medicare Other | Admitting: *Deleted

## 2021-03-27 NOTE — Telephone Encounter (Signed)
Received notification that Earl Calderon not covered by insurance without prior authorization  Tried to get drug benefit information from pharmacy, closed. Will call again when return to office

## 2021-03-29 NOTE — Telephone Encounter (Signed)
Called pharmacy and obtained patients Rx info for PA for Hoyle Barr [id number] PCN [facility] WM2A ID # [id number]  Will forward to Wonda Horner LPN for PA assistance    See telephone note 7/8 for return call

## 2021-03-29 NOTE — Telephone Encounter (Signed)
Pt is returning call.  

## 2021-03-29 NOTE — Telephone Encounter (Signed)
Spoke with patient and discussed monitor.  He does not want to wear at this time Advised patient if anything changes do not hesitate to call back and can get mailed to him Patient verbalized understanding and thanked me for calling him back He did apologize for not calling back sooner, has had phone issues

## 2021-04-01 NOTE — Telephone Encounter (Signed)
**Note De-Identified Earl Calderon Obfuscation** I attempted to do this PA through covermymeds X 2 but received this following message each time: There was an error with your request ? Cannot find matching patient due to system outage. Please try again later.  I then called [employer] at the pharmacy number listed on the back of the pts ins card and did a [place] PA with [place]. Per Roselyn Reef we will receive a determination within 72 hours.

## 2021-04-02 ENCOUNTER — Encounter: Payer: Self-pay | Admitting: Gastroenterology

## 2021-04-04 NOTE — Progress Notes (Signed)
[facility] Note  04/05/2021     CHIEF COMPLAINT Patient presents for Retina Follow Up   HISTORY OF PRESENT ILLNESS: Earl Calderon is a 70 y.o. male who presents to the clinic today for:   HPI     Retina Follow Up   Patient presents with  Diabetic Retinopathy.  In both eyes.  This started 4 weeks ago.  I, the attending physician,  performed the HPI with the patient and updated documentation appropriately.        Comments   Patient here for 4 weeks retina follow up for PDR OU. Patient states vision about the same. No eye pain.       Last edited by [name], MD on 04/05/2021 12:35 PM.      Referring physician: Debbra Riding, MD [address] [place],  [place] [address]  HISTORICAL INFORMATION:   Selected notes from the MEDICAL RECORD NUMBER Referred by Dr. [name] for severe NPDR OU   CURRENT MEDICATIONS: No current outpatient medications on file. (Ophthalmic Drugs)   No current facility-administered medications for this visit. (Ophthalmic Drugs)   Current Outpatient Medications (Other)  Medication Sig   APPLE CIDER VINEGAR PO Take 1 tablet by mouth daily at 6 (six) AM.   aspirin EC 81 MG tablet Take 1 tablet (81 mg total) by mouth daily. Swallow whole.   carvedilol (COREG) 25 MG tablet Take 1 tablet (25 mg total) by mouth 2 (two) times daily with a meal.   dapagliflozin propanediol (FARXIGA) 10 MG TABS tablet Take 1 tablet (10 mg total) by mouth daily before breakfast.   ELDERBERRY PO Take 100 mg by mouth daily.   ferrous sulfate 325 (65 FE) MG tablet Take 1 tablet (325 mg total) by mouth daily with breakfast.   furosemide (LASIX) 40 MG tablet Take 1 tablet (40 mg total) by mouth daily.   glucose blood (FREESTYLE TEST STRIPS) test strip Use as instructed   insulin aspart (NOVOLOG FLEXPEN) 100 UNIT/ML FlexPen Before each meal 3 times a day, 140-199 - 2 units, 200-250 - 4 units, 251-299 - 6 units,  300-349 - 8 units,  350  or above 10 units. Insulin PEN if approved, provide syringes and needles if needed.   Insulin Pen Needle (NOVOFINE PEN NEEDLE) 32G X 6 MM MISC USE AS DIRECTED   LEVEMIR FLEXTOUCH 100 UNIT/ML FlexPen Inject 8 Units into the skin 2 (two) times daily.   nitroGLYCERIN (NITROSTAT) 0.4 MG SL tablet Place 1 tablet (0.4 mg total) under the tongue every 5 (five) minutes as needed for chest pain.   Probiotic Product (PROBIOTIC MULTI-ENZYME) TABS Take 3 tablets by mouth daily at 6 (six) AM.   rosuvastatin (CRESTOR) 40 MG tablet Take 1 tablet (40 mg total) by mouth daily at 6 PM.   sacubitril-valsartan (ENTRESTO) 97-103 MG Take 1 tablet by mouth 2 (two) times daily.   spironolactone (ALDACTONE) 25 MG tablet Take 1 tablet (25 mg total) by mouth daily.   Turmeric 500 MG TABS Take 2 tablets by mouth daily at 6 (six) AM.   No current facility-administered medications for this visit. (Other)      REVIEW OF SYSTEMS: ROS   Positive for: Endocrine, Cardiovascular, Eyes Negative for: Constitutional, Gastrointestinal, Neurological, Skin, Genitourinary, Musculoskeletal, HENT, Respiratory, Psychiatric, Allergic/Imm, Heme/Lymph Last edited by Theodore Demark, COA on 04/05/2021  9:51 AM.         ALLERGIES No Known Allergies  PAST MEDICAL HISTORY  Past Medical History:  Diagnosis Date   CAD in native artery 01/01/2021   Prior LAD PCI.  Currently no symptoms of ischemia.  Encouraged him to do some light exercising.  Continue aspirin, carvedilol, and rosuvastatin.   Cataract    CKD (chronic kidney disease), stage III ([place]) 08/24/2020   Coronary artery disease    Diabetes mellitus without complication (HCC)    on meds   Diabetic retinopathy ([place])    Hernia, inguinal    Hypertension    on meds   Hypertensive retinopathy    NSVT (nonsustained ventricular tachycardia) ([place]) 08/24/2020   Uncontrolled type 2 diabetes mellitus ([place]) 11/11/2018   Past Surgical History:  Procedure Laterality Date    CORONARY STENT INTERVENTION N/A 11/15/2018   Procedure: CORONARY STENT INTERVENTION;  Surgeon: [name], MD;  Location: [facility];  Service: Cardiovascular;  Laterality: N/A;   HEMORROIDECTOMY     RIGHT HEART CATH N/A 07/18/2020   Procedure: RIGHT HEART CATH;  Surgeon: [name], MD;  Location: [facility];  Service: Cardiovascular;  Laterality: N/A;   RIGHT/LEFT HEART CATH AND CORONARY ANGIOGRAPHY N/A 11/15/2018   Procedure: RIGHT/LEFT HEART CATH AND CORONARY ANGIOGRAPHY;  Surgeon: [name], MD;  Location: [facility];  Service: Cardiovascular;  Laterality: N/A;   WISDOM TOOTH EXTRACTION      FAMILY HISTORY Family History  Problem Relation Age of Onset   Hypertension Mother    Diabetes Mellitus II Mother    Arrhythmia Mother    Heart disease Mother    Hypertension Sister    Hypertension Brother    Hypertension Other    Diabetes Mellitus II Other        All 9 sibblings have HTN   Colon cancer Neg Hx    Colon polyps Neg Hx    Esophageal cancer Neg Hx    Stomach cancer Neg Hx    Rectal cancer Neg Hx     SOCIAL HISTORY Social History   Tobacco Use   Smoking status: Never   Smokeless tobacco: Never  Vaping Use   Vaping Use: Never used  Substance Use Topics   Alcohol use: Not Currently   Drug use: Never         OPHTHALMIC EXAM:  Base Eye Exam     Visual Acuity (Snellen - Linear)       Right Left   Dist [place] 20/50 20/80   Dist ph  NI 20/80 +2         Tonometry (Tonopen, 9:49 AM)       Right Left   Pressure 11 11         Pupils       Dark Light Shape React APD   Right 4 3 Round Minimal None   Left 4 3 Round Minimal None         Visual Fields (Counting fingers)       Left Right   Restrictions Partial outer superior nasal deficiency Partial outer superior temporal deficiency         Extraocular Movement       Right Left    Full, Ortho Full, Ortho         Neuro/Psych     Oriented  x3: Yes   Mood/Affect: Normal         Dilation     Both eyes: 1.0% Mydriacyl, 2.5% Phenylephrine @ 9:48 AM           Slit Lamp  and Fundus Exam     Slit Lamp Exam       Right Left   Lids/Lashes Dermatochalasis - upper lid Dermatochalasis - upper lid   Conjunctiva/Sclera Melanosis Melanosis   Cornea arcus arcus   Anterior Chamber deep and clear deep and clear   Iris Round and dilated, No NVI Round and dilated, No NVI   Lens 2-3+ Nuclear sclerosis, 2-3+ Cortical cataract 2-3+ Nuclear sclerosis, 2-3+ Cortical cataract   Vitreous Vitreous syneresis Vitreous syneresis         Fundus Exam       Right Left   Disc Pink and Sharp, no NVD Pink and Sharp, no NVD   C/D Ratio 0.6 0.6   Macula Blunted foveal reflex, central edema--slightly increased, scattered MA/exudate, +DBH good foveal reflex, scattered MA/DBH, + edema slightly improved   Vessels attenuated, tortuous, focal NV IN arcades--regressing attenuated, Tortuous   Periphery Attached, 360 MA/DBH greatest posteriorly, light 360 PRP changes Attached, 360 DBH            IMAGING AND PROCEDURES  Imaging and Procedures for 04/05/2021  OCT, Retina - OU - Both Eyes       Right Eye Quality was good. Central Foveal Thickness: 409. Progression has worsened. Findings include abnormal foveal contour, intraretinal fluid, intraretinal hyper-reflective material, vitreomacular adhesion , no SRF (Interval increase in IRF greatest nasal mac, interval worsening of foveal profile).   Left Eye Quality was good. Central Foveal Thickness: 252. Progression has improved. Findings include intraretinal fluid, no SRF, intraretinal hyper-reflective material, vitreomacular adhesion , normal foveal contour (Mild interval improvement in temporal IRF and foveal contour).   Notes *Images captured and stored on drive  Diagnosis / Impression:  Abnormal foveal contour and +DME OU OD: Interval increase in IRF greatest nasal mac, interval worsening  of foveal profile OS: Mild interval improvement in temporal IRF and foveal contour  Clinical management:  See below  Abbreviations: NFP - Normal foveal profile. CME - cystoid macular edema. PED - pigment epithelial detachment. IRF - intraretinal fluid. SRF - subretinal fluid. EZ - ellipsoid zone. ERM - epiretinal membrane. ORA - outer retinal atrophy. ORT - outer retinal tubulation. SRHM - subretinal hyper-reflective material. IRHM - intraretinal hyper-reflective material      Intravitreal Injection, Pharmacologic Agent - OD - Right Eye       Time Out 04/05/2021. 10:47 AM. Confirmed correct patient, procedure, site, and patient consented.   Anesthesia Topical anesthesia was used. Anesthetic medications included Lidocaine 2%, Proparacaine 0.5%.   Procedure Preparation included 5% betadine to ocular surface, eyelid speculum. A supplied needle was used.   Injection: 1.25 mg Bevacizumab 1.82m/0.05ml   Route: Intravitreal, Site: Right Eye   NDC:: 42595-638-75 Lot: [id number] , Expiration date: 05/25/2021, Waste: 0 mL   Post-op Post injection exam found visual acuity of at least counting fingers. The patient tolerated the procedure well. There were no complications. The patient received written and verbal post procedure care education. Post injection medications were not given.      Intravitreal Injection, Pharmacologic Agent - OS - Left Eye       Time Out 04/05/2021. 10:48 AM. Confirmed correct patient, procedure, site, and patient consented.   Anesthesia Topical anesthesia was used. Anesthetic medications included Lidocaine 2%, Proparacaine 0.5%.   Procedure Preparation included 5% betadine to ocular surface, eyelid speculum. A (32g) needle was used.   Injection: 1.25 mg Bevacizumab 1.261m0.05ml   Route: Intravitreal, Site: Left Eye   NDC: [id number]: : [id number] date: 05/21/2021, Waste:  0.05 mL   Post-op Post injection exam found visual acuity of at least  counting fingers. The patient tolerated the procedure well. There were no complications. The patient received written and verbal post procedure care education. Post injection medications were not given.                ASSESSMENT/PLAN:    ICD-10-CM   1. Proliferative diabetic retinopathy of right eye with macular edema associated with type 2 diabetes mellitus (HCC)  I34.7425 Intravitreal Injection, Pharmacologic Agent - OD - Right Eye    Bevacizumab (AVASTIN) SOLN 1.25 mg    2. Severe nonproliferative diabetic retinopathy of left eye with macular edema associated with type 2 diabetes mellitus (HCC)  Z56.3875 Intravitreal Injection, Pharmacologic Agent - OS - Left Eye    Bevacizumab (AVASTIN) SOLN 1.25 mg    3. Retinal edema  H35.81 OCT, Retina - OU - Both Eyes    4. Essential hypertension  I10     5. Hypertensive retinopathy of both eyes  H35.033     6. Combined forms of age-related cataract of both eyes  H25.813      1-3. Proliferative diabetic retinopathy, OD  - severe NPDR OS  - s/p IVA OU #1 (06.06.22), #2 (07.08.22)  - s/p PRP OD (06.10.22) - exam shows central edema OU, scattered IRH -- improving - FA (06.06.22) shows OD: focal NVE IN arcades; OS: no NV OS; late leaking MA's OU -- s/p PRP OD 6.10.22 - OCT shows OD: Interval increase in IRF greatest nasal mac, interval worsening of foveal profile; OS: Mild interval improvement in temporal IRF and foveal contour - recommend IVA OU #3 today, 08.05.22 - pt wishes to proceed - RBA of procedure discussed, questions answered - informed consent obtained and signed - see procedure note - IVA informed consent obtained and signed (OU), 06.06.22 - f/u 4 weeks for DFE/OCT, possible injection(s)  4,5. Hypertensive retinopathy OU - discussed importance of tight BP control - monitor  6. Mixed Cataract OU - The symptoms of cataract, surgical options, and treatments and risks were discussed with patient. - discussed diagnosis and  progression - not yet visually significant - monitor for now  Ophthalmic Meds Ordered this visit:  Meds ordered this encounter  Medications   Bevacizumab (AVASTIN) SOLN 1.25 mg   Bevacizumab (AVASTIN) SOLN 1.25 mg      Return in about 4 weeks (around 05/03/2021) for 4 wk f/u for DR OU w/DFE/OCT/likely injs..  There are no Patient Instructions on file for this visit.  This document serves as a record of services personally performed by Gardiner Sleeper, MD, PhD. It was created on their behalf by San Jetty. Owens Shark, OA an ophthalmic technician. The creation of this record is the provider's dictation and/or activities during the visit.    Electronically signed by: San Jetty. Owens Shark, [place] 08.04.2022 12:47 PM  This document serves as a record of services personally performed by Gardiner Sleeper, MD, PhD. It was created on their behalf by Estill Bakes, COT an ophthalmic technician. The creation of this record is the provider's dictation and/or activities during the visit.    Electronically signed by: Estill Bakes, COT 8.5.22 @ 12:47 PM   Gardiner Sleeper, M.D., Ph.D. Diseases & Surgery of the Retina and Vitreous [facility] 8.5.22  I have reviewed the above documentation for accuracy and completeness, and I agree with the above. Gardiner Sleeper, M.D., Ph.D. 04/05/21 12:47 PM   Abbreviations: Jerilynn Mages myopia (  nearsighted); A astigmatism; H hyperopia (farsighted); P presbyopia; Mrx spectacle prescription;  CTL contact lenses; OD right eye; OS left eye; OU both eyes  XT exotropia; ET esotropia; PEK punctate epithelial keratitis; PEE punctate epithelial erosions; DES dry eye syndrome; MGD meibomian gland dysfunction; ATs artificial tears; PFAT's preservative free artificial tears; [place] nuclear sclerotic cataract; PSC posterior subcapsular cataract; ERM epi-retinal membrane; PVD posterior vitreous detachment; RD retinal detachment; DM diabetes mellitus; DR diabetic retinopathy; NPDR  non-proliferative diabetic retinopathy; PDR proliferative diabetic retinopathy; CSME clinically significant macular edema; DME diabetic macular edema; dbh dot blot hemorrhages; CWS cotton wool spot; POAG primary open angle glaucoma; C/D cup-to-disc ratio; HVF humphrey visual field; GVF goldmann visual field; OCT optical coherence tomography; IOP intraocular pressure; BRVO Branch retinal vein occlusion; CRVO central retinal vein occlusion; CRAO central retinal artery occlusion; BRAO branch retinal artery occlusion; RT retinal tear; SB scleral buckle; PPV pars plana vitrectomy; VH Vitreous hemorrhage; PRP panretinal laser photocoagulation; IVK intravitreal kenalog; VMT vitreomacular traction; MH Macular hole;  NVD neovascularization of the disc; NVE neovascularization elsewhere; AREDS age related eye disease study; ARMD age related macular degeneration; POAG primary open angle glaucoma; EBMD epithelial/anterior basement membrane dystrophy; ACIOL anterior chamber intraocular lens; IOL intraocular lens; PCIOL posterior chamber intraocular lens; Phaco/IOL phacoemulsification with intraocular lens placement; [place] photorefractive keratectomy; LASIK laser assisted in situ keratomileusis; HTN hypertension; DM diabetes mellitus; COPD chronic obstructive pulmonary disease

## 2021-04-05 ENCOUNTER — Other Ambulatory Visit: Payer: Self-pay

## 2021-04-05 ENCOUNTER — Ambulatory Visit (INDEPENDENT_AMBULATORY_CARE_PROVIDER_SITE_OTHER): Payer: Medicaid Other | Admitting: Ophthalmology

## 2021-04-05 ENCOUNTER — Encounter (INDEPENDENT_AMBULATORY_CARE_PROVIDER_SITE_OTHER): Payer: Medicare Other | Admitting: Ophthalmology

## 2021-04-05 ENCOUNTER — Encounter (INDEPENDENT_AMBULATORY_CARE_PROVIDER_SITE_OTHER): Payer: Self-pay | Admitting: Ophthalmology

## 2021-04-05 DIAGNOSIS — E113412 Type 2 diabetes mellitus with severe nonproliferative diabetic retinopathy with macular edema, left eye: Secondary | ICD-10-CM

## 2021-04-05 DIAGNOSIS — I10 Essential (primary) hypertension: Secondary | ICD-10-CM

## 2021-04-05 DIAGNOSIS — E113511 Type 2 diabetes mellitus with proliferative diabetic retinopathy with macular edema, right eye: Secondary | ICD-10-CM

## 2021-04-05 DIAGNOSIS — H3581 Retinal edema: Secondary | ICD-10-CM | POA: Diagnosis not present

## 2021-04-05 DIAGNOSIS — H25813 Combined forms of age-related cataract, bilateral: Secondary | ICD-10-CM

## 2021-04-05 DIAGNOSIS — H35033 Hypertensive retinopathy, bilateral: Secondary | ICD-10-CM

## 2021-04-05 MED ORDER — BEVACIZUMAB CHEMO INJECTION 1.25MG/0.05ML SYRINGE FOR KALEIDOSCOPE
1.2500 mg | INTRAVITREAL | Status: AC | PRN
  Administered 2021-04-05: 1.25 mg via INTRAVITREAL

## 2021-04-10 ENCOUNTER — Other Ambulatory Visit: Payer: Self-pay

## 2021-04-10 ENCOUNTER — Ambulatory Visit (HOSPITAL_BASED_OUTPATIENT_CLINIC_OR_DEPARTMENT_OTHER)
Admission: RE | Admit: 2021-04-10 | Discharge: 2021-04-10 | Disposition: A | Discharge status: Home / Self Care | Payer: Medicare Other | Source: Ambulatory Visit | Attending: Family Medicine | Admitting: Family Medicine

## 2021-04-10 DIAGNOSIS — R221 Localized swelling, mass and lump, neck: Secondary | ICD-10-CM | POA: Insufficient documentation

## 2021-05-02 NOTE — Progress Notes (Signed)
[facility] Note  05/03/2021     CHIEF COMPLAINT Patient presents for Retina Follow Up   HISTORY OF PRESENT ILLNESS: Earl Calderon is a 70 y.o. male who presents to the clinic today for:   HPI     Retina Follow Up   Patient presents with  Diabetic Retinopathy.  In both eyes.  This started 4 weeks ago.  I, the attending physician,  performed the HPI with the patient and updated documentation appropriately.        Comments   Patient here for 4 weeks retina follow up for PDR OD, NPDR OS. Patient states vision about the same. Can't tell improving, but not getting worse. No eye pain.       Last edited by [name], MD on 05/03/2021  3:18 PM.    Pt states he had an appt with his cardiologist this morning and she told him his BP was low, so she is going to adjust his meds, he states his BG is also "very low"   Referring physician: [name], MD [address]  [address] [place],  [place] [address]  HISTORICAL INFORMATION:   Selected notes from the MEDICAL RECORD NUMBER Referred by Dr. [name] for severe NPDR OU   CURRENT MEDICATIONS: No current outpatient medications on file. (Ophthalmic Drugs)   No current facility-administered medications for this visit. (Ophthalmic Drugs)   Current Outpatient Medications (Other)  Medication Sig   APPLE CIDER VINEGAR PO Take 1 tablet by mouth daily at 6 (six) AM.   aspirin EC 81 MG tablet Take 1 tablet (81 mg total) by mouth daily. Swallow whole.   carvedilol (COREG) 12.5 MG tablet Take 1 tablet (12.5 mg total) by mouth 2 (two) times daily with a meal.   ELDERBERRY PO Take 100 mg by mouth daily.   empagliflozin (JARDIANCE) 10 MG TABS tablet Take 1 tablet (10 mg total) by mouth daily before breakfast.   ferrous sulfate 325 (65 FE) MG tablet Take 1 tablet (325 mg total) by mouth daily with breakfast.   furosemide (LASIX) 40 MG tablet Take 1 tablet (40 mg total) by mouth daily.   glucose  blood (FREESTYLE TEST STRIPS) test strip Use as instructed   insulin aspart (NOVOLOG FLEXPEN) 100 UNIT/ML FlexPen Before each meal 3 times a day, 140-199 - 2 units, 200-250 - 4 units, 251-299 - 6 units,  300-349 - 8 units,  350 or above 10 units. Insulin PEN if approved, provide syringes and needles if needed.   Insulin Pen Needle (NOVOFINE PEN NEEDLE) 32G X 6 MM MISC USE AS DIRECTED   LEVEMIR FLEXTOUCH 100 UNIT/ML FlexPen Inject 8 Units into the skin 2 (two) times daily.   nitroGLYCERIN (NITROSTAT) 0.4 MG SL tablet Place 1 tablet (0.4 mg total) under the tongue every 5 (five) minutes as needed for chest pain.   Probiotic Product (PROBIOTIC MULTI-ENZYME) TABS Take 3 tablets by mouth daily at 6 (six) AM.   rosuvastatin (CRESTOR) 40 MG tablet Take 1 tablet (40 mg total) by mouth daily at 6 PM.   sacubitril-valsartan (ENTRESTO) 97-103 MG Take 1 tablet by mouth 2 (two) times daily.   spironolactone (ALDACTONE) 25 MG tablet Take 1 tablet (25 mg total) by mouth daily.   Turmeric 500 MG TABS Take 2 tablets by mouth daily at 6 (six) AM.   No current facility-administered medications for this visit. (Other)   REVIEW OF SYSTEMS: ROS   Positive for: Endocrine, Cardiovascular,  Eyes Negative for: Constitutional, Gastrointestinal, Neurological, Skin, Genitourinary, Musculoskeletal, HENT, Respiratory, Psychiatric, Allergic/Imm, Heme/Lymph Last edited by Theodore Demark, COA on 05/03/2021 12:32 PM.     ALLERGIES No Known Allergies  PAST MEDICAL HISTORY Past Medical History:  Diagnosis Date   CAD in native artery 01/01/2021   Prior LAD PCI.  Currently no symptoms of ischemia.  Encouraged him to do some light exercising.  Continue aspirin, carvedilol, and rosuvastatin.   Cataract    CKD (chronic kidney disease), stage III ([facility]) 08/24/2020   Coronary artery disease    Diabetes mellitus without complication (HCC)    on meds   Diabetic retinopathy ([place])    Hernia, inguinal    Hypertension    on meds    Hypertensive retinopathy    NSVT (nonsustained ventricular tachycardia) ([place]) 08/24/2020   Uncontrolled type 2 diabetes mellitus ([place]) 11/11/2018   Past Surgical History:  Procedure Laterality Date   CORONARY STENT INTERVENTION N/A 11/15/2018   Procedure: CORONARY STENT INTERVENTION;  Surgeon: [name], MD;  Location: [facility];  Service: Cardiovascular;  Laterality: N/A;   HEMORROIDECTOMY     RIGHT HEART CATH N/A 07/18/2020   Procedure: RIGHT HEART CATH;  Surgeon: [name], MD;  Location: [facility];  Service: Cardiovascular;  Laterality: N/A;   RIGHT/LEFT HEART CATH AND CORONARY ANGIOGRAPHY N/A 11/15/2018   Procedure: RIGHT/LEFT HEART CATH AND CORONARY ANGIOGRAPHY;  Surgeon: [name], MD;  Location: [facility];  Service: Cardiovascular;  Laterality: N/A;   WISDOM TOOTH EXTRACTION      FAMILY HISTORY Family History  Problem Relation Age of Onset   Hypertension Mother    Diabetes Mellitus II Mother    Arrhythmia Mother    Heart disease Mother    Hypertension Sister    Hypertension Brother    Heart disease Brother    Kidney disease Brother    Hypertension Other    Diabetes Mellitus II Other        All 9 sibblings have HTN   Colon cancer Neg Hx    Colon polyps Neg Hx    Esophageal cancer Neg Hx    Stomach cancer Neg Hx    Rectal cancer Neg Hx     SOCIAL HISTORY Social History   Tobacco Use   Smoking status: Never   Smokeless tobacco: Never  Vaping Use   Vaping Use: Never used  Substance Use Topics   Alcohol use: Not Currently   Drug use: Never         OPHTHALMIC EXAM:  Base Eye Exam     Visual Acuity (Snellen - Linear)       Right Left   Dist [place] 20/50 -1 20/80 +2   Dist ph [place] 20/40 -1 NI         Tonometry (Tonopen, 12:29 PM)       Right Left   Pressure 13 11         Pupils       Dark Light Shape React APD   Right 4 3 Round Minimal None   Left 4 3 Round Minimal None          Visual Fields       Left Right   Restrictions Partial outer superior nasal deficiency Partial outer superior temporal deficiency         Extraocular Movement       Right Left    Full, Ortho Full, Ortho  Neuro/Psych     Oriented x3: Yes   Mood/Affect: Normal         Dilation     Both eyes: 1.0% Mydriacyl, 2.5% Phenylephrine @ 12:29 PM           Slit Lamp and Fundus Exam     Slit Lamp Exam       Right Left   Lids/Lashes Dermatochalasis - upper lid Dermatochalasis - upper lid   Conjunctiva/Sclera Melanosis Melanosis   Cornea arcus arcus   Anterior Chamber deep and clear deep and clear   Iris Round and dilated, No NVI Round and dilated, No NVI   Lens 2-3+ Nuclear sclerosis, 2-3+ Cortical cataract 2-3+ Nuclear sclerosis, 2-3+ Cortical cataract   Vitreous Vitreous syneresis Vitreous syneresis         Fundus Exam       Right Left   Disc Pink and Sharp, no NVD Pink and Sharp, no NVD   C/D Ratio 0.6 0.6   Macula Blunted foveal reflex, central edema--slightly increased, scattered MA/exudate, +DBH good foveal reflex, scattered MA/DBH, +edema -- persistent   Vessels attenuated, tortuous, focal NV IN arcades--regressing attenuated, Tortuous   Periphery Attached, 360 MA/DBH greatest posteriorly, light 360 PRP changes Attached, 360 DBH            IMAGING AND PROCEDURES  Imaging and Procedures for 05/03/2021  OCT, Retina - OU - Both Eyes       Right Eye Quality was good. Central Foveal Thickness: 423. Progression has worsened. Findings include abnormal foveal contour, intraretinal fluid, intraretinal hyper-reflective material, vitreomacular adhesion , no SRF (Mild Interval increase in IRF greatest nasal mac, interval worsening of foveal profile).   Left Eye Quality was good. Central Foveal Thickness: 265. Progression has worsened. Findings include intraretinal fluid, no SRF, intraretinal hyper-reflective material, vitreomacular adhesion , normal foveal  contour (Mild interval increase in IRF centrally ).   Notes *Images captured and stored on drive  Diagnosis / Impression:  Abnormal foveal contour and +DME OU OD: mild interval increase in IRF greatest nasal mac, interval worsening of foveal profile OS: Mild interval increase in IRF centrally   Clinical management:  See below  Abbreviations: NFP - Normal foveal profile. CME - cystoid macular edema. PED - pigment epithelial detachment. IRF - intraretinal fluid. SRF - subretinal fluid. EZ - ellipsoid zone. ERM - epiretinal membrane. ORA - outer retinal atrophy. ORT - outer retinal tubulation. SRHM - subretinal hyper-reflective material. IRHM - intraretinal hyper-reflective material      Intravitreal Injection, Pharmacologic Agent - OD - Right Eye       Time Out 05/03/2021. 12:52 PM. Confirmed correct patient, procedure, site, and patient consented.   Anesthesia Topical anesthesia was used. Anesthetic medications included Lidocaine 2%, Proparacaine 0.5%.   Procedure Preparation included 5% betadine to ocular surface, eyelid speculum. A supplied needle was used.   Injection: 1.25 mg Bevacizumab 1.3m/0.05ml   Route: Intravitreal, Site: Right Eye   NDC:: 85885-027-74 Lot: [id number] , Expiration date: 05/29/2021, Waste: 0 mL   Post-op Post injection exam found visual acuity of at least counting fingers. The patient tolerated the procedure well. There were no complications. The patient received written and verbal post procedure care education. Post injection medications were not given.      Intravitreal Injection, Pharmacologic Agent - OS - Left Eye       Time Out 05/03/2021. 12:53 PM. Confirmed correct patient, procedure, site, and patient consented.   Anesthesia Topical anesthesia was used. Anesthetic medications included Lidocaine 2%,  Proparacaine 0.5%.   Procedure Preparation included 5% betadine to ocular surface, eyelid speculum. A (32g) needle was used.    Injection: 1.25 mg Bevacizumab 1.26m/0.05ml   Route: Intravitreal, Site: Left Eye   NDC:: 13244-010-27 Lot: [id number], Expiration date: 06/22/2021, Waste: 0.05 mL   Post-op Post injection exam found visual acuity of at least counting fingers. The patient tolerated the procedure well. There were no complications. The patient received written and verbal post procedure care education. Post injection medications were not given.            ASSESSMENT/PLAN:    ICD-10-CM   1. Proliferative diabetic retinopathy of right eye with macular edema associated with type 2 diabetes mellitus (HCC)  EO53.6644Intravitreal Injection, Pharmacologic Agent - OD - Right Eye    Bevacizumab (AVASTIN) SOLN 1.25 mg    2. Severe nonproliferative diabetic retinopathy of left eye with macular edema associated with type 2 diabetes mellitus (HCC)  EI34.7425Intravitreal Injection, Pharmacologic Agent - OS - Left Eye    Bevacizumab (AVASTIN) SOLN 1.25 mg    3. Retinal edema  H35.81 OCT, Retina - OU - Both Eyes    4. Essential hypertension  I10     5. Hypertensive retinopathy of both eyes  H35.033     6. Combined forms of age-related cataract of both eyes  H25.813       1-3. Proliferative diabetic retinopathy, OD  - severe NPDR OS  - s/p IVA OU #1 (06.06.22), #2 (07.08.22), #3 (08.05.22)  - s/p PRP OD (06.10.22) - exam shows central edema OU, scattered IRH -- improving - FA (06.06.22) shows OD: focal NVE IN arcades; OS: no NV OS; late leaking MA's OU -- s/p PRP OD 6.10.22 - OCT shows OD: mild interval increase in IRF greatest nasal mac, interval worsening of foveal profile; OS: Mild interval increase in IRF centrally  - recommend IVA OU #4 today, 09.02.22 - pt wishes to proceed - RBA of procedure discussed, questions answered - informed consent obtained and signed - see procedure note - IVA informed consent obtained and signed (OU), 06.06.22 - f/u 4 weeks for DFE/OCT, possible injection(s)  4,5.  Hypertensive retinopathy OU - discussed importance of tight BP control - monitor   6. Mixed Cataract OU - The symptoms of cataract, surgical options, and treatments and risks were discussed with patient. - discussed diagnosis and progression - not yet visually significant - monitor for now  Ophthalmic Meds Ordered this visit:  Meds ordered this encounter  Medications   Bevacizumab (AVASTIN) SOLN 1.25 mg   Bevacizumab (AVASTIN) SOLN 1.25 mg      Return in about 4 weeks (around 05/31/2021) for f/u PDR OD, DFE, OCT.  There are no Patient Instructions on file for this visit.  This document serves as a record of services personally performed by BGardiner Sleeper MD, PhD. It was created on their behalf by RLeonie Douglas an ophthalmic technician. The creation of this record is the provider's dictation and/or activities during the visit.    Electronically signed by: RLeonie DouglasCOA, 05/03/21  3:33 PM  This document serves as a record of services personally performed by BGardiner Sleeper MD, PhD. It was created on their behalf by ASan Jetty BOwens Shark OA an ophthalmic technician. The creation of this record is the provider's dictation and/or activities during the visit.    Electronically signed by: ASan Jetty BOwens Shark O[place]09.02.2022 3:33 PM  BGardiner Sleeper M.D., Ph.D. Diseases & Surgery of the Retina and Vitreous  [place] 05/03/2021  I have reviewed the above documentation for accuracy and completeness, and I agree with the above. Gardiner Sleeper, M.D., Ph.D. 05/03/21 3:33 PM   Abbreviations: M myopia (nearsighted); A astigmatism; H hyperopia (farsighted); P presbyopia; Mrx spectacle prescription;  CTL contact lenses; OD right eye; OS left eye; OU both eyes  XT exotropia; ET esotropia; PEK punctate epithelial keratitis; PEE punctate epithelial erosions; DES dry eye syndrome; MGD meibomian gland dysfunction; ATs artificial tears; PFAT's preservative free artificial tears;  [place] nuclear sclerotic cataract; PSC posterior subcapsular cataract; ERM epi-retinal membrane; PVD posterior vitreous detachment; RD retinal detachment; DM diabetes mellitus; DR diabetic retinopathy; NPDR non-proliferative diabetic retinopathy; PDR proliferative diabetic retinopathy; CSME clinically significant macular edema; DME diabetic macular edema; dbh dot blot hemorrhages; CWS cotton wool spot; POAG primary open angle glaucoma; C/D cup-to-disc ratio; HVF humphrey visual field; GVF goldmann visual field; OCT optical coherence tomography; IOP intraocular pressure; BRVO Branch retinal vein occlusion; CRVO central retinal vein occlusion; CRAO central retinal artery occlusion; BRAO branch retinal artery occlusion; RT retinal tear; SB scleral buckle; PPV pars plana vitrectomy; VH Vitreous hemorrhage; PRP panretinal laser photocoagulation; IVK intravitreal kenalog; VMT vitreomacular traction; MH Macular hole;  NVD neovascularization of the disc; NVE neovascularization elsewhere; AREDS age related eye disease study; ARMD age related macular degeneration; POAG primary open angle glaucoma; EBMD epithelial/anterior basement membrane dystrophy; ACIOL anterior chamber intraocular lens; IOL intraocular lens; PCIOL posterior chamber intraocular lens; Phaco/IOL phacoemulsification with intraocular lens placement; [place] photorefractive keratectomy; LASIK laser assisted in situ keratomileusis; HTN hypertension; DM diabetes mellitus; COPD chronic obstructive pulmonary disease

## 2021-05-03 ENCOUNTER — Encounter (INDEPENDENT_AMBULATORY_CARE_PROVIDER_SITE_OTHER): Payer: Self-pay | Admitting: Ophthalmology

## 2021-05-03 ENCOUNTER — Encounter (HOSPITAL_BASED_OUTPATIENT_CLINIC_OR_DEPARTMENT_OTHER): Payer: Self-pay | Admitting: Cardiovascular Disease

## 2021-05-03 ENCOUNTER — Other Ambulatory Visit: Payer: Self-pay

## 2021-05-03 ENCOUNTER — Encounter (INDEPENDENT_AMBULATORY_CARE_PROVIDER_SITE_OTHER): Payer: Medicare Other | Admitting: Ophthalmology

## 2021-05-03 ENCOUNTER — Ambulatory Visit (INDEPENDENT_AMBULATORY_CARE_PROVIDER_SITE_OTHER): Payer: Medicare Other | Admitting: Cardiovascular Disease

## 2021-05-03 ENCOUNTER — Ambulatory Visit (INDEPENDENT_AMBULATORY_CARE_PROVIDER_SITE_OTHER): Payer: Medicare Other | Admitting: Ophthalmology

## 2021-05-03 DIAGNOSIS — Z955 Presence of coronary angioplasty implant and graft: Secondary | ICD-10-CM

## 2021-05-03 DIAGNOSIS — I5042 Chronic combined systolic (congestive) and diastolic (congestive) heart failure: Secondary | ICD-10-CM

## 2021-05-03 DIAGNOSIS — E1165 Type 2 diabetes mellitus with hyperglycemia: Secondary | ICD-10-CM

## 2021-05-03 DIAGNOSIS — I10 Essential (primary) hypertension: Secondary | ICD-10-CM

## 2021-05-03 DIAGNOSIS — H35033 Hypertensive retinopathy, bilateral: Secondary | ICD-10-CM

## 2021-05-03 DIAGNOSIS — E113412 Type 2 diabetes mellitus with severe nonproliferative diabetic retinopathy with macular edema, left eye: Secondary | ICD-10-CM

## 2021-05-03 DIAGNOSIS — H25813 Combined forms of age-related cataract, bilateral: Secondary | ICD-10-CM

## 2021-05-03 DIAGNOSIS — E113511 Type 2 diabetes mellitus with proliferative diabetic retinopathy with macular edema, right eye: Secondary | ICD-10-CM | POA: Diagnosis not present

## 2021-05-03 DIAGNOSIS — H3581 Retinal edema: Secondary | ICD-10-CM

## 2021-05-03 MED ORDER — BEVACIZUMAB CHEMO INJECTION 1.25MG/0.05ML SYRINGE FOR KALEIDOSCOPE
1.2500 mg | INTRAVITREAL | Status: AC | PRN
  Administered 2021-05-03: 1.25 mg via INTRAVITREAL

## 2021-05-03 MED ORDER — CARVEDILOL 12.5 MG PO TABS: 12.5000 mg | ORAL_TABLET | Freq: Two times a day (BID) | ORAL | 3 refills | Status: DC

## 2021-05-03 MED ORDER — EMPAGLIFLOZIN 10 MG PO TABS: 10.0000 mg | ORAL_TABLET | Freq: Every day | ORAL | 3 refills | Status: DC

## 2021-05-03 NOTE — Patient Instructions (Signed)
Medication Instructions:  DECREASE YOUR CARVEDILOL TO 12.5 MG TWICE A DAY OK TO TAKE 1/2 OF THE 25 MG TABLETS UNTIL YOU RUN OUT   STOP FARXIGA   START JARDIANCE 10 MG DAILY   *If you need a refill on your cardiac medications before your next appointment, please call your pharmacy*  Lab Work: NONE  Testing/Procedures: Your physician has requested that you have an echocardiogram. Echocardiography is a painless test that uses sound waves to create images of your heart. It provides your doctor with information about the size and shape of your heart and how well your heart's chambers and valves are working. This procedure takes approximately one hour. There are no restrictions for this procedure.  TO BE DONE THE END OF September   Follow-Up: At [facility], you and your health needs are our priority.  As part of our continuing mission to provide you with exceptional heart care, we have created designated Provider Care Teams.  These Care Teams include your primary Cardiologist (physician) and Advanced Practice Providers (APPs -  Physician Assistants and Nurse Practitioners) who all work together to provide you with the care you need, when you need it.  We recommend signing up for the patient portal called "MyChart".  Sign up information is provided on this After Visit Summary.  MyChart is used to connect with patients for Virtual Visits (Telemedicine).  Patients are able to view lab/test results, encounter notes, upcoming appointments, etc.  Non-urgent messages can be sent to your provider as well.   To learn more about what you can do with MyChart, go to [email or web address].    Your next appointment:   3 month(s)  The format for your next appointment:   In Person  Provider:   [name], MD or Laurann Montana, NP

## 2021-05-03 NOTE — Assessment & Plan Note (Signed)
Blood pressure is much better controlled.  In fact, it is low today.  He notes that he is only taking his carvedilol once a day.  We will reduce it to 12.5 mg and asked that he start taking it twice a day.  Continue the Entresto and spironolactone.  He does note feeling fatigued and I suspect the carvedilol may be contributing.

## 2021-05-03 NOTE — Progress Notes (Signed)
Cardiology Office Note   Date:  05/03/2021   ID:  Earl Calderon, DOB [birth year], MRN [id number]  PCP:  [name], MD  Cardiologist:   [name], MD   No chief complaint on file.    History of Present Illness: Earl Calderon is a 70 yo male with chronic systolic and diastolic heart failure, prior stroke, diabetes, hypertension, hyperlipidemia, CAD status post PCI and nonadherence admitted 07/2020 with acute on chronic systolic and diastolic heart failure in the setting of not taking his medications. He was admitted with volume overload and dyspnea.  He wanted to "remove all chemicals from his body" and decided to stop his medications. Echo revealed LVEF 30-35% with global hypokinesis and moderate LVH. BP was poorly controlled. There was concern for cardiac amyloidosis.  PYP scan during that hospitalization was not suggestive of ATTR amyloidosis.  He was started on carvedilol, Entresto, spironolactone, BiDil, and for [place].  Prior to discharge he had a right heart cath with radial pressure of 8, RV 53/3, pulmonary capillary wedge 15.  Mean PA pressure was 29 mmHg.  After discharge he was unable to get spironolactone or BiDil from the pharmacy.  Earl Calderon was first seen by cardiology 10/2018 at which time he was hospitalized and found to have a newly reduced LVEF to 25 to 30%.  EKG revealed left bundle branch block.  He underwent right and left heart cath 10/2018 and was found to have a 90% mid LAD lesion that was successfully stented with drug-eluting stent.  He did not follow-up after that time.  He was seen 07/2020 and he was not taking spirolactone or vidl, however they were not started because his blood pressure was low and he was fatigued. His Lasix was decreased given he was on farxiga. He followed up with [name] and Imdur was added. He then felt fatigued and weak and was seen on 10/2020 for chest pain.  At his last appointment he was doing well from a cardiac  standpoint. He reported some DOE that did not concern him. He was working with [name] for diabetic retinopathy. Today, he is fatigued and mildly short of breath consistently. His eyesight is mostly unchanged. He has seen slight improvement with the injection treatments. At home his blood pressure has been low, with readings such as 98/65. He does experience lightheadedness when it is too low. To get around he walks with a cane, and denies any falls. After some work around the house to keep busy, he needs to stop and rest. Lately he does not notice any LE edema, but notes he is unsure of what to look for. His quality of sleep has also improved. He denies any palpitations, chest pain, headaches, syncope, orthopnea, or PND.    Past Medical History:  Diagnosis Date   CAD in native artery 01/01/2021   Prior LAD PCI.  Currently no symptoms of ischemia.  Encouraged him to do some light exercising.  Continue aspirin, carvedilol, and rosuvastatin.   Cataract    CKD (chronic kidney disease), stage III ([place]) 08/24/2020   Coronary artery disease    Diabetes mellitus without complication (HCC)    on meds   Diabetic retinopathy ([place])    Hernia, inguinal    Hypertension    on meds   Hypertensive retinopathy    NSVT (nonsustained ventricular tachycardia) ([place]) 08/24/2020   Uncontrolled type 2 diabetes mellitus ([place]) 11/11/2018    Past Surgical History:  Procedure Laterality Date   CORONARY STENT  INTERVENTION N/A 11/15/2018   Procedure: CORONARY STENT INTERVENTION;  Surgeon: [name], MD;  Location: [facility];  Service: Cardiovascular;  Laterality: N/A;   HEMORROIDECTOMY     RIGHT HEART CATH N/A 07/18/2020   Procedure: RIGHT HEART CATH;  Surgeon: [name], MD;  Location: [facility];  Service: Cardiovascular;  Laterality: N/A;   RIGHT/LEFT HEART CATH AND CORONARY ANGIOGRAPHY N/A 11/15/2018   Procedure: RIGHT/LEFT HEART CATH AND CORONARY ANGIOGRAPHY;  Surgeon:  [name], MD;  Location: [facility];  Service: Cardiovascular;  Laterality: N/A;   WISDOM TOOTH EXTRACTION       Current Outpatient Medications  Medication Sig Dispense Refill   APPLE CIDER VINEGAR PO Take 1 tablet by mouth daily at 6 (six) AM.     aspirin EC 81 MG tablet Take 1 tablet (81 mg total) by mouth daily. Swallow whole. 90 tablet 3   ELDERBERRY PO Take 100 mg by mouth daily.     empagliflozin (JARDIANCE) 10 MG TABS tablet Take 1 tablet (10 mg total) by mouth daily before breakfast. 90 tablet 3   ferrous sulfate 325 (65 FE) MG tablet Take 1 tablet (325 mg total) by mouth daily with breakfast. 30 tablet 3   furosemide (LASIX) 40 MG tablet Take 1 tablet (40 mg total) by mouth daily. 90 tablet 3   glucose blood (FREESTYLE TEST STRIPS) test strip Use as instructed 100 each 12   insulin aspart (NOVOLOG FLEXPEN) 100 UNIT/ML FlexPen Before each meal 3 times a day, 140-199 - 2 units, 200-250 - 4 units, 251-299 - 6 units,  300-349 - 8 units,  350 or above 10 units. Insulin PEN if approved, provide syringes and needles if needed. 15 mL 2   Insulin Pen Needle (NOVOFINE PEN NEEDLE) 32G X 6 MM MISC USE AS DIRECTED 100 each 6   LEVEMIR FLEXTOUCH 100 UNIT/ML FlexPen Inject 8 Units into the skin 2 (two) times daily. 15 mL 1   nitroGLYCERIN (NITROSTAT) 0.4 MG SL tablet Place 1 tablet (0.4 mg total) under the tongue every 5 (five) minutes as needed for chest pain. 25 tablet 3   Probiotic Product (PROBIOTIC MULTI-ENZYME) TABS Take 3 tablets by mouth daily at 6 (six) AM.     rosuvastatin (CRESTOR) 40 MG tablet Take 1 tablet (40 mg total) by mouth daily at 6 PM. 90 tablet 3   sacubitril-valsartan (ENTRESTO) 97-103 MG Take 1 tablet by mouth 2 (two) times daily. 180 tablet 3   spironolactone (ALDACTONE) 25 MG tablet Take 1 tablet (25 mg total) by mouth daily. 90 tablet 3   Turmeric 500 MG TABS Take 2 tablets by mouth daily at 6 (six) AM.     carvedilol (COREG) 12.5 MG tablet Take 1  tablet (12.5 mg total) by mouth 2 (two) times daily with a meal. 180 tablet 3   No current facility-administered medications for this visit.    Allergies:   Patient has no known allergies.    Social History:  The patient  reports that he has never smoked. He has never used smokeless tobacco. He reports that he does not currently use alcohol. He reports that he does not use drugs.   Family History:  The patient's family history includes Arrhythmia in his mother; Diabetes Mellitus II in his mother and another family member; Heart disease in his brother and mother; Hypertension in his brother, mother, sister, and another family member; Kidney disease in his brother.    ROS:  Please see the history of present illness. (+) Fatigue (+) Shortness of breath (+) Lightheadedness All other systems are reviewed and negative.    PHYSICAL EXAM: VS:  BP 107/64 (BP Location: Left Arm, Patient Position: Sitting)   Pulse (!) 59   Ht '5\' 10"'$  (1.778 m)   Wt 143 lb 14.4 oz (65.3 kg)   BMI 20.65 kg/m  , BMI Body mass index is 20.65 kg/m. GENERAL:  Well appearing HEENT:  Pupils equal round and reactive, fundi not visualized, oral mucosa unremarkable NECK:  No jugular venous distention, waveform within normal limits, carotid upstroke brisk and symmetric, no bruits LUNGS:  Clear to auscultation bilaterally HEART:  RRR.  PMI not displaced or sustained,S1 and S2 within normal limits, no S3, no S4, no clicks, no rubs, no murmurs ABD:  Flat, positive bowel sounds normal in frequency in pitch, no bruits, no rebound, no guarding, no midline pulsatile mass, no hepatomegaly, no splenomegaly EXT:  2 plus pulses throughout, trace edema, no cyanosis no clubbing SKIN:  No rashes no nodules NEURO:  Cranial nerves II through XII grossly intact, motor grossly intact throughout PSYCH:  Cognitively intact, oriented to person place and time  EKG:   05/03/2021: Sinus bradycardia. Rate 59 bpm. LBBB. 01/01/2021: EKG was not  ordered. 07/24/2020: EKG was not ordered.  US Thyroid 04/10/2021: IMPRESSION: 1. Indeterminate right supraclavicular soft tissue mass. No evidence of cervical lymphadenopathy. The mass would be amenable to percutaneous biopsy as clinically indicated. 2. Normal appearance of the thyroid gland.  Echo 02/20/2021: 1. Left ventricular ejection fraction, by estimation, is 40 to 45%. The  left ventricle has mildly decreased function. The left ventricle  demonstrates global hypokinesis. There is severe left ventricular  hypertrophy. Left ventricular diastolic parameters   are consistent with Grade II diastolic dysfunction (pseudonormalization).  Elevated left atrial pressure.   2. Right ventricular systolic function is mildly reduced. The right  ventricular size is normal. There is normal pulmonary artery systolic  pressure. The estimated right ventricular systolic pressure is [id number] mmHg.   3. Left atrial size was moderately dilated.   4. The mitral valve is normal in structure. No evidence of mitral valve  regurgitation. No evidence of mitral stenosis.   5. The aortic valve is tricuspid. Aortic valve regurgitation is not  visualized. Mild aortic valve sclerosis is present, with no evidence of  aortic valve stenosis.   6. The inferior vena cava is normal in size with <50% respiratory  variability, suggesting right atrial pressure of 8 mmHg.   Comparison(s): Compared to prior echo, LV systolic function has improved.   [place] 07/18/20: Mildly elevated filling pressures, mild to moderate pulmonary hypertension and mildly reduced cardiac output.   RA: 8 mmHg RV: 53/3 mmHg PCW 15 mmHg PA: 50/17 mmHg with a mean of 29   AO sat 97% PA sat 60% Cardiac output: 3.92 with a cardiac index of 2.15.  Echo 07/10/20:  1. Left ventricular ejection fraction, by estimation, is 30 to 35%. The  left ventricle has moderately decreased function. The left ventricle  demonstrates global hypokinesis. There is  moderate concentric left  ventricular hypertrophy. Left ventricular  diastolic parameters are consistent with Grade II diastolic dysfunction  (pseudonormalization). Elevated left atrial pressure.   2. Right ventricular systolic function is moderately reduced. The right  ventricular size is mildly enlarged. Mildly increased right ventricular  wall thickness. There is moderately elevated pulmonary artery systolic  pressure. The estimated right  ventricular systolic pressure is [id number] mmHg.  3. Left atrial size was severely dilated.   4. Right atrial size was moderately dilated.   5. The mitral valve is normal in structure. Mild mitral valve  regurgitation.   6. Tricuspid valve regurgitation is moderate.   7. The aortic valve is tricuspid. Aortic valve regurgitation is not  visualized. No aortic stenosis is present.   8. The inferior vena cava is dilated in size with <50% respiratory  variability, suggesting right atrial pressure of 15 mmHg.   LHC 11/15/18:  Prox LAD to Mid LAD lesion is 40% stenosed. Mid Cx to Dist Cx lesion is 25% stenosed. Ost 2nd Diag to 2nd Diag lesion is 50% stenosed. Ost 3rd Mrg lesion is 25% stenosed. Mid LAD lesion is 90% stenosed. A drug-eluting stent was successfully placed using a STENT SYNERGY DES 2.5X16. Post intervention, there is a 0% residual stenosis. LV end diastolic pressure is normal. There is no aortic valve stenosis. Ao 92%, PA 66%, mean PA 17 mm Hg, mean PCWP 3 mm Hg, CO 5.1; CI 2.8- normal right heart pressures Tortuous right subclavian. 3DRC was used for RCA.   Continue aggressive secondary prevention in addition to dual antiplatelet therapy.  Adequate diuresis based on right heart pressures.  Continue medical therapy for heart failure.    Recent Labs: 07/17/2020: B Natriuretic Peptide 241.4 07/19/2020: Magnesium 2.1 09/26/2020: TSH 1.540 01/16/2021: ALT 16; BUN 19; Creatinine, Ser 1.00; Hemoglobin 12.2; Platelets 174; Potassium 4.0; Sodium  144    Lipid Panel    Component Value Date/Time   CHOL 103 01/16/2021 1705   TRIG 66 01/16/2021 1705   HDL 47 01/16/2021 1705   CHOLHDL 2.2 01/16/2021 1705   CHOLHDL 3.6 07/12/2020 0510   VLDL 13 07/12/2020 0510   LDLCALC 42 01/16/2021 1705      Wt Readings from Last 3 Encounters:  05/03/21 143 lb 14.4 oz (65.3 kg)  03/21/21 141 lb (64 kg)  02/28/21 141 lb (64 kg)      ASSESSMENT AND PLAN: Chronic combined systolic and diastolic heart failure (HCC) He has very mild lower extremity edema on exam but is otherwise stable.  Plan to repeat his echo at the end of the month which will be 3 months on therapy.  Continue carvedilol, Entresto, and spironolactone.  He is currently on Farxiga but his insurance company will not approve it.  We will switch to Jardiance.  Hypertension Blood pressure is much better controlled.  In fact, it is low today.  He notes that he is only taking his carvedilol once a day.  We will reduce it to 12.5 mg and asked that he start taking it twice a day.  Continue the Entresto and spironolactone.  He does note feeling fatigued and I suspect the carvedilol may be contributing.  Uncontrolled type 2 diabetes mellitus ([place]) Switching Farxiga to [place] due to insurance issues as above.  Status post coronary artery stent placement He has known CAD status post PCI.  He has no angina.  Continue aspirin, carvedilol, and rosuvastatin.  Lipids are well-controlled.  His LDL was 42 on 12/2020.   Current medicines are reviewed at length with the patient today.  The patient does not have concerns regarding medicines.  The following changes have been made: Switch Farxiga to Jardiance  Labs/ tests ordered today include:   Orders Placed This Encounter  Procedures   EKG 12-Lead   ECHOCARDIOGRAM COMPLETE     Disposition:   FU with Harman Ferrin C. Oval Linsey, MD, [facility] in 2-3 months.  I,Mathew Stumpf,acting as a Education administrator for [name], MD.,have documented all  relevant documentation on the behalf of [name], MD,as directed by  [name], MD while in the presence of [name], MD.  I, [facility] Oval Linsey, MD have reviewed all documentation for this visit.  The documentation of the exam, diagnosis, procedures, and orders on 05/03/2021 are all accurate and complete.   Signed, AmeLie Hollars C. Oval Linsey, MD, [facility]  05/03/2021 12:41 PM    [facility]

## 2021-05-03 NOTE — Assessment & Plan Note (Signed)
Switching Farxiga to [place] due to insurance issues as above.

## 2021-05-03 NOTE — Assessment & Plan Note (Signed)
He has very mild lower extremity edema on exam but is otherwise stable.  Plan to repeat his echo at the end of the month which will be 3 months on therapy.  Continue carvedilol, Entresto, and spironolactone.  He is currently on Farxiga but his insurance company will not approve it.  We will switch to Jardiance.

## 2021-05-03 NOTE — Assessment & Plan Note (Signed)
He has known CAD status post PCI.  He has no angina.  Continue aspirin, carvedilol, and rosuvastatin.  Lipids are well-controlled.  His LDL was 42 on 12/2020.

## 2021-05-07 NOTE — Telephone Encounter (Signed)
Patient seen in office last week and Farxiga changed to [place]

## 2021-05-27 ENCOUNTER — Ambulatory Visit (HOSPITAL_COMMUNITY): Payer: Medicare Other | Attending: Cardiology

## 2021-05-27 ENCOUNTER — Other Ambulatory Visit: Payer: Self-pay

## 2021-05-27 DIAGNOSIS — I10 Essential (primary) hypertension: Secondary | ICD-10-CM | POA: Diagnosis present

## 2021-05-27 DIAGNOSIS — I5042 Chronic combined systolic (congestive) and diastolic (congestive) heart failure: Secondary | ICD-10-CM | POA: Insufficient documentation

## 2021-05-27 DIAGNOSIS — I517 Cardiomegaly: Secondary | ICD-10-CM

## 2021-05-27 DIAGNOSIS — I313 Pericardial effusion (noninflammatory): Secondary | ICD-10-CM | POA: Diagnosis not present

## 2021-05-27 DIAGNOSIS — I77810 Thoracic aortic ectasia: Secondary | ICD-10-CM | POA: Diagnosis not present

## 2021-05-27 DIAGNOSIS — I5030 Unspecified diastolic (congestive) heart failure: Secondary | ICD-10-CM | POA: Diagnosis not present

## 2021-05-27 LAB — ECHOCARDIOGRAM COMPLETE
Area-P 1/2: 3.91 cm2
S' Lateral: 3 cm

## 2021-05-29 NOTE — Progress Notes (Signed)
[facility] Note  05/31/2021     CHIEF COMPLAINT Patient presents for Retina Follow Up   HISTORY OF PRESENT ILLNESS: Earl Calderon is a 70 y.o. male who presents to the clinic today for:   HPI     Retina Follow Up   Patient presents with  Diabetic Retinopathy.  In right eye.  Severity is moderate.  Duration of 4 weeks.  Since onset it is stable.  I, the attending physician,  performed the HPI with the patient and updated documentation appropriately.        Comments   Patient states vision the same OU. BS hasn't checked today. Last a1c was 6.6 on 05.18.22.      Last edited by [name], MD on 05/31/2021  4:26 PM.    Pt states he cant tell any improvement in vision  Referring physician: [name], MD [address]  [address] [place],  [place] [address]  HISTORICAL INFORMATION:   Selected notes from the MEDICAL RECORD NUMBER Referred by Dr. [name] for severe NPDR OU   CURRENT MEDICATIONS: No current outpatient medications on file. (Ophthalmic Drugs)   No current facility-administered medications for this visit. (Ophthalmic Drugs)   Current Outpatient Medications (Other)  Medication Sig   APPLE CIDER VINEGAR PO Take 1 tablet by mouth daily at 6 (six) AM.   aspirin EC 81 MG tablet Take 1 tablet (81 mg total) by mouth daily. Swallow whole.   carvedilol (COREG) 12.5 MG tablet Take 1 tablet (12.5 mg total) by mouth 2 (two) times daily with a meal.   ELDERBERRY PO Take 100 mg by mouth daily.   empagliflozin (JARDIANCE) 10 MG TABS tablet Take 1 tablet (10 mg total) by mouth daily before breakfast.   ferrous sulfate 325 (65 FE) MG tablet Take 1 tablet (325 mg total) by mouth daily with breakfast.   furosemide (LASIX) 40 MG tablet Take 1 tablet (40 mg total) by mouth daily.   glucose blood (FREESTYLE TEST STRIPS) test strip Use as instructed   insulin aspart (NOVOLOG FLEXPEN) 100 UNIT/ML FlexPen Before each meal 3 times a  day, 140-199 - 2 units, 200-250 - 4 units, 251-299 - 6 units,  300-349 - 8 units,  350 or above 10 units. Insulin PEN if approved, provide syringes and needles if needed.   Insulin Pen Needle (NOVOFINE PEN NEEDLE) 32G X 6 MM MISC USE AS DIRECTED   LEVEMIR FLEXTOUCH 100 UNIT/ML FlexPen Inject 8 Units into the skin 2 (two) times daily.   Probiotic Product (PROBIOTIC MULTI-ENZYME) TABS Take 3 tablets by mouth daily at 6 (six) AM.   sacubitril-valsartan (ENTRESTO) 97-103 MG Take 1 tablet by mouth 2 (two) times daily.   Turmeric 500 MG TABS Take 2 tablets by mouth daily at 6 (six) AM.   nitroGLYCERIN (NITROSTAT) 0.4 MG SL tablet Place 1 tablet (0.4 mg total) under the tongue every 5 (five) minutes as needed for chest pain.   rosuvastatin (CRESTOR) 40 MG tablet Take 1 tablet (40 mg total) by mouth daily at 6 PM.   spironolactone (ALDACTONE) 25 MG tablet Take 1 tablet (25 mg total) by mouth daily.   No current facility-administered medications for this visit. (Other)   REVIEW OF SYSTEMS: ROS   Positive for: Endocrine, Cardiovascular, Eyes Negative for: Constitutional, Gastrointestinal, Neurological, Skin, Genitourinary, Musculoskeletal, HENT, Respiratory, Psychiatric, Allergic/Imm, Heme/Lymph Last edited by Roselee Nova D, COT on 05/31/2021 12:46 PM.     ALLERGIES No Known  Allergies  PAST MEDICAL HISTORY Past Medical History:  Diagnosis Date   CAD in native artery 01/01/2021   Prior LAD PCI.  Currently no symptoms of ischemia.  Encouraged him to do some light exercising.  Continue aspirin, carvedilol, and rosuvastatin.   Cataract    CKD (chronic kidney disease), stage III ([place]) 08/24/2020   Coronary artery disease    Diabetes mellitus without complication (HCC)    on meds   Diabetic retinopathy ([place])    Hernia, inguinal    Hypertension    on meds   Hypertensive retinopathy    NSVT (nonsustained ventricular tachycardia) ([place]) 08/24/2020   Uncontrolled type 2 diabetes mellitus ([place])  11/11/2018   Past Surgical History:  Procedure Laterality Date   CORONARY STENT INTERVENTION N/A 11/15/2018   Procedure: CORONARY STENT INTERVENTION;  Surgeon: [name], MD;  Location: [facility];  Service: Cardiovascular;  Laterality: N/A;   HEMORROIDECTOMY     RIGHT HEART CATH N/A 07/18/2020   Procedure: RIGHT HEART CATH;  Surgeon: [name], MD;  Location: [facility];  Service: Cardiovascular;  Laterality: N/A;   RIGHT/LEFT HEART CATH AND CORONARY ANGIOGRAPHY N/A 11/15/2018   Procedure: RIGHT/LEFT HEART CATH AND CORONARY ANGIOGRAPHY;  Surgeon: [name], MD;  Location: [facility];  Service: Cardiovascular;  Laterality: N/A;   WISDOM TOOTH EXTRACTION      FAMILY HISTORY Family History  Problem Relation Age of Onset   Hypertension Mother    Diabetes Mellitus II Mother    Arrhythmia Mother    Heart disease Mother    Hypertension Sister    Hypertension Brother    Heart disease Brother    Kidney disease Brother    Hypertension Other    Diabetes Mellitus II Other        All 9 sibblings have HTN   Colon cancer Neg Hx    Colon polyps Neg Hx    Esophageal cancer Neg Hx    Stomach cancer Neg Hx    Rectal cancer Neg Hx    SOCIAL HISTORY Social History   Tobacco Use   Smoking status: Never   Smokeless tobacco: Never  Vaping Use   Vaping Use: Never used  Substance Use Topics   Alcohol use: Not Currently   Drug use: Never       OPHTHALMIC EXAM:  Base Eye Exam     Visual Acuity (Snellen - Linear)       Right Left   Dist [place] 20/30 -2 20/80 -1   Dist ph [place] NI 20/80 +1         Tonometry (Tonopen, 12:58 PM)       Right Left   Pressure 15 14         Pupils       Dark Light Shape React APD   Right 4 3 Round Minimal None   Left 4 3 Round Minimal None         Visual Fields       Left Right   Restrictions Partial outer superior nasal deficiency Partial outer superior temporal deficiency          Extraocular Movement       Right Left    Full, Ortho Full, Ortho         Neuro/Psych     Oriented x3: Yes   Mood/Affect: Normal         Dilation     Both eyes: 1.0% Mydriacyl, 2.5% Phenylephrine @  12:58 PM           Slit Lamp and Fundus Exam     Slit Lamp Exam       Right Left   Lids/Lashes Dermatochalasis - upper lid Dermatochalasis - upper lid   Conjunctiva/Sclera Melanosis Melanosis   Cornea arcus arcus   Anterior Chamber deep and clear deep and clear   Iris Round and poorly dilated, No NVI Round and dilated, No NVI   Lens 2-3+ Nuclear sclerosis, 2-3+ Cortical cataract 2-3+ Nuclear sclerosis, 2-3+ Cortical cataract   Vitreous Vitreous syneresis Vitreous syneresis         Fundus Exam       Right Left   Disc Pink and Sharp, no NVD Pink and Sharp, no NVD   C/D Ratio 0.6 0.6   Macula Blunted foveal reflex, central edema--slightly improved, scattered MA/exudate, +DBH good foveal reflex, scattered MA/DBH, +edema -- improved, +exudates and CWS   Vessels attenuated, tortuous, focal NV IN arcades--regressing attenuated, Tortuous   Periphery Attached, 360 MA/DBH greatest posteriorly, light 360 PRP changes Attached, 360 DBH           IMAGING AND PROCEDURES  Imaging and Procedures for 05/31/2021  OCT, Retina - OU - Both Eyes       Right Eye Quality was good. Central Foveal Thickness: 358. Progression has improved. Findings include abnormal foveal contour, intraretinal fluid, intraretinal hyper-reflective material, vitreomacular adhesion , no SRF (Interval improvement in IRF/IRHM greatest nasal mac).   Left Eye Quality was good. Central Foveal Thickness: 224. Progression has improved. Findings include intraretinal fluid, no SRF, intraretinal hyper-reflective material, vitreomacular adhesion , normal foveal contour (Mild interval improvement in IRF/IRHM/foveal contour ).   Notes *Images captured and stored on drive  Diagnosis / Impression:  Abnormal foveal  contour and +DME OU OD: Interval improvement in IRF/IRHM greatest nasal mac OS: Mild interval improvement in IRF/IRHM/foveal contour   Clinical management:  See below  Abbreviations: NFP - Normal foveal profile. CME - cystoid macular edema. PED - pigment epithelial detachment. IRF - intraretinal fluid. SRF - subretinal fluid. EZ - ellipsoid zone. ERM - epiretinal membrane. ORA - outer retinal atrophy. ORT - outer retinal tubulation. SRHM - subretinal hyper-reflective material. IRHM - intraretinal hyper-reflective material      Intravitreal Injection, Pharmacologic Agent - OD - Right Eye       Time Out 05/31/2021. 1:13 PM. Confirmed correct patient, procedure, site, and patient consented.   Anesthesia Topical anesthesia was used. Anesthetic medications included Lidocaine 2%, Proparacaine 0.5%.   Procedure Preparation included 5% betadine to ocular surface, eyelid speculum. A supplied needle was used.   Injection: 1.25 mg Bevacizumab 1.63m/0.05ml   Route: Intravitreal, Site: Right Eye   NDC:: 46803-212-24 Lot: [id number], Expiration date: 07/17/2021, Waste: 0 mL   Post-op Post injection exam found visual acuity of at least counting fingers. The patient tolerated the procedure well. There were no complications. The patient received written and verbal post procedure care education. Post injection medications were not given.      Intravitreal Injection, Pharmacologic Agent - OS - Left Eye       Time Out 05/31/2021. 1:13 PM. Confirmed correct patient, procedure, site, and patient consented.   Anesthesia Topical anesthesia was used. Anesthetic medications included Lidocaine 2%, Proparacaine 0.5%.   Procedure Preparation included 5% betadine to ocular surface, eyelid speculum. A (32g) needle was used.   Injection: 1.25 mg Bevacizumab 1.21m0.05ml   Route: Intravitreal, Site: Left Eye   NDC: : 80165-537-48Lot: : [id number] date:  07/24/2021, Waste: 0.05 mL    Post-op Post injection exam found visual acuity of at least counting fingers. The patient tolerated the procedure well. There were no complications. The patient received written and verbal post procedure care education. Post injection medications were not given.            ASSESSMENT/PLAN:    ICD-10-CM   1. Proliferative diabetic retinopathy of right eye with macular edema associated with type 2 diabetes mellitus (HCC)  F81.0175 Intravitreal Injection, Pharmacologic Agent - OD - Right Eye    Bevacizumab (AVASTIN) SOLN 1.25 mg    2. Severe nonproliferative diabetic retinopathy of left eye with macular edema associated with type 2 diabetes mellitus (HCC)  Z02.5852 Intravitreal Injection, Pharmacologic Agent - OS - Left Eye    Bevacizumab (AVASTIN) SOLN 1.25 mg    3. Retinal edema  H35.81 OCT, Retina - OU - Both Eyes    4. Essential hypertension  I10     5. Hypertensive retinopathy of both eyes  H35.033     6. Combined forms of age-related cataract of both eyes  H25.813     1-3. Proliferative diabetic retinopathy, OD  - severe NPDR OS  - s/p IVA OU #1 (06.06.22), #2 (07.08.22), #3 (08.05.22), #4 (09.02.22)  - s/p PRP OD (06.10.22) - exam shows central edema OU, scattered IRH -- improving - FA (06.06.22) shows OD: focal NVE IN arcades; OS: no NV OS; late leaking MA's OU -- s/p PRP OD 6.10.22 - OCT shows OD: Interval improvement in IRF/IRHM greatest nasal mac; OS: Mild interval improvement in IRF/IRHM/foveal contour  - recommend IVA OU #5 today, 09.30.22 - pt wishes to proceed - RBA of procedure discussed, questions answered - informed consent obtained and signed - see procedure note - IVA informed consent obtained and signed (OU), 06.06.22 - f/u 4 weeks for DFE/OCT, possible injection(s)  4,5. Hypertensive retinopathy OU - discussed importance of tight BP control - monitor   6. Mixed Cataract OU - The symptoms of cataract, surgical options, and treatments and risks were  discussed with patient. - discussed diagnosis and progression - not yet visually significant - monitor for now  Ophthalmic Meds Ordered this visit:  Meds ordered this encounter  Medications   Bevacizumab (AVASTIN) SOLN 1.25 mg   Bevacizumab (AVASTIN) SOLN 1.25 mg      Return in about 4 weeks (around 06/28/2021) for f/u PDR OU, DFE, OCT.  There are no Patient Instructions on file for this visit.  This document serves as a record of services personally performed by Gardiner Sleeper, MD, PhD. It was created on their behalf by Leonie Douglas, an ophthalmic technician. The creation of this record is the provider's dictation and/or activities during the visit.    Electronically signed by: Leonie Douglas COA, 05/31/21  4:37 PM  This document serves as a record of services personally performed by Gardiner Sleeper, MD, PhD. It was created on their behalf by San Jetty. Owens Shark, OA an ophthalmic technician. The creation of this record is the provider's dictation and/or activities during the visit.    Electronically signed by: San Jetty. Marguerita Merles 09.30.2022 4:37 PM  Gardiner Sleeper, M.D., Ph.D. Diseases & Surgery of the Retina and [place] 05/31/2021  I have reviewed the above documentation for accuracy and completeness, and I agree with the above. Gardiner Sleeper, M.D., Ph.D. 05/31/21 4:38 PM  Abbreviations: M myopia (nearsighted); A astigmatism; H hyperopia (farsighted); P presbyopia; Mrx spectacle prescription;  CTL contact lenses; OD right eye; OS left eye; OU both eyes  XT exotropia; ET esotropia; PEK punctate epithelial keratitis; PEE punctate epithelial erosions; DES dry eye syndrome; MGD meibomian gland dysfunction; ATs artificial tears; PFAT's preservative free artificial tears; [place] nuclear sclerotic cataract; PSC posterior subcapsular cataract; ERM epi-retinal membrane; PVD posterior vitreous detachment; RD retinal detachment; DM diabetes mellitus; DR diabetic  retinopathy; NPDR non-proliferative diabetic retinopathy; PDR proliferative diabetic retinopathy; CSME clinically significant macular edema; DME diabetic macular edema; dbh dot blot hemorrhages; CWS cotton wool spot; POAG primary open angle glaucoma; C/D cup-to-disc ratio; HVF humphrey visual field; GVF goldmann visual field; OCT optical coherence tomography; IOP intraocular pressure; BRVO Branch retinal vein occlusion; CRVO central retinal vein occlusion; CRAO central retinal artery occlusion; BRAO branch retinal artery occlusion; RT retinal tear; SB scleral buckle; PPV pars plana vitrectomy; VH Vitreous hemorrhage; PRP panretinal laser photocoagulation; IVK intravitreal kenalog; VMT vitreomacular traction; MH Macular hole;  NVD neovascularization of the disc; NVE neovascularization elsewhere; AREDS age related eye disease study; ARMD age related macular degeneration; POAG primary open angle glaucoma; EBMD epithelial/anterior basement membrane dystrophy; ACIOL anterior chamber intraocular lens; IOL intraocular lens; PCIOL posterior chamber intraocular lens; Phaco/IOL phacoemulsification with intraocular lens placement; [place] photorefractive keratectomy; LASIK laser assisted in situ keratomileusis; HTN hypertension; DM diabetes mellitus; COPD chronic obstructive pulmonary disease

## 2021-05-31 ENCOUNTER — Ambulatory Visit (INDEPENDENT_AMBULATORY_CARE_PROVIDER_SITE_OTHER): Payer: Medicare Other | Admitting: Ophthalmology

## 2021-05-31 ENCOUNTER — Other Ambulatory Visit: Payer: Self-pay

## 2021-05-31 ENCOUNTER — Encounter (INDEPENDENT_AMBULATORY_CARE_PROVIDER_SITE_OTHER): Payer: Self-pay | Admitting: Ophthalmology

## 2021-05-31 DIAGNOSIS — E113511 Type 2 diabetes mellitus with proliferative diabetic retinopathy with macular edema, right eye: Secondary | ICD-10-CM | POA: Diagnosis not present

## 2021-05-31 DIAGNOSIS — E113412 Type 2 diabetes mellitus with severe nonproliferative diabetic retinopathy with macular edema, left eye: Secondary | ICD-10-CM

## 2021-05-31 DIAGNOSIS — H35033 Hypertensive retinopathy, bilateral: Secondary | ICD-10-CM

## 2021-05-31 DIAGNOSIS — I10 Essential (primary) hypertension: Secondary | ICD-10-CM | POA: Diagnosis not present

## 2021-05-31 DIAGNOSIS — H25813 Combined forms of age-related cataract, bilateral: Secondary | ICD-10-CM

## 2021-05-31 DIAGNOSIS — H3581 Retinal edema: Secondary | ICD-10-CM

## 2021-05-31 MED ORDER — BEVACIZUMAB CHEMO INJECTION 1.25MG/0.05ML SYRINGE FOR KALEIDOSCOPE
1.2500 mg | INTRAVITREAL | Status: AC | PRN
  Administered 2021-05-31: 1.25 mg via INTRAVITREAL

## 2021-06-24 NOTE — Progress Notes (Signed)
[facility] Note  06/28/2021     CHIEF COMPLAINT Patient presents for Retina Follow Up   HISTORY OF PRESENT ILLNESS: Earl Calderon is a 70 y.o. male who presents to the clinic today for:   HPI     Retina Follow Up   Patient presents with  Diabetic Retinopathy.  In right eye.  This started weeks ago.  Severity is moderate.  Duration of weeks.  Since onset it is stable.  I, the attending physician,  performed the HPI with the patient and updated documentation appropriately.        Comments   BS: 112 A1c: 6.6 Pt states vision is the same OU.  Pt denies eye pain or discomfort and denies new or worsening floaters or fol OU.      Last edited by [name], MD on 06/30/2021  4:18 AM.     Pt states he cant tell any improvement in vision  Referring physician: [name], MD [address]  [address] [place],  [place] [address]  HISTORICAL INFORMATION:   Selected notes from the MEDICAL RECORD NUMBER Referred by Dr. [name] for severe NPDR OU   CURRENT MEDICATIONS: No current outpatient medications on file. (Ophthalmic Drugs)   No current facility-administered medications for this visit. (Ophthalmic Drugs)   Current Outpatient Medications (Other)  Medication Sig   APPLE CIDER VINEGAR PO Take 1 tablet by mouth daily at 6 (six) AM.   aspirin EC 81 MG tablet Take 1 tablet (81 mg total) by mouth daily. Swallow whole.   ELDERBERRY PO Take 100 mg by mouth daily.   empagliflozin (JARDIANCE) 10 MG TABS tablet Take 1 tablet (10 mg total) by mouth daily before breakfast.   ferrous sulfate 325 (65 FE) MG tablet Take 1 tablet (325 mg total) by mouth daily with breakfast.   furosemide (LASIX) 40 MG tablet Take 1 tablet (40 mg total) by mouth daily.   glucose blood (FREESTYLE TEST STRIPS) test strip Use as instructed   insulin aspart (NOVOLOG FLEXPEN) 100 UNIT/ML FlexPen Before each meal 3 times a day, 140-199 - 2 units, 200-250 - 4  units, 251-299 - 6 units,  300-349 - 8 units,  350 or above 10 units. Insulin PEN if approved, provide syringes and needles if needed.   Insulin Pen Needle (NOVOFINE PEN NEEDLE) 32G X 6 MM MISC USE AS DIRECTED   LEVEMIR FLEXTOUCH 100 UNIT/ML FlexPen Inject 8 Units into the skin 2 (two) times daily.   Probiotic Product (PROBIOTIC MULTI-ENZYME) TABS Take 3 tablets by mouth daily at 6 (six) AM.   sacubitril-valsartan (ENTRESTO) 97-103 MG Take 1 tablet by mouth 2 (two) times daily.   Turmeric 500 MG TABS Take 2 tablets by mouth daily at 6 (six) AM.   carvedilol (COREG) 12.5 MG tablet Take 1 tablet (12.5 mg total) by mouth 2 (two) times daily with a meal.   nitroGLYCERIN (NITROSTAT) 0.4 MG SL tablet Place 1 tablet (0.4 mg total) under the tongue every 5 (five) minutes as needed for chest pain.   rosuvastatin (CRESTOR) 40 MG tablet Take 1 tablet (40 mg total) by mouth daily at 6 PM.   spironolactone (ALDACTONE) 25 MG tablet Take 1 tablet (25 mg total) by mouth daily.   No current facility-administered medications for this visit. (Other)   REVIEW OF SYSTEMS: ROS   Positive for: Endocrine, Cardiovascular, Eyes Negative for: Constitutional, Gastrointestinal, Neurological, Skin, Genitourinary, Musculoskeletal, HENT, Respiratory, Psychiatric, Allergic/Imm, Heme/Lymph Last edited  by Doneen Poisson on 06/28/2021  1:15 PM.     ALLERGIES No Known Allergies  PAST MEDICAL HISTORY Past Medical History:  Diagnosis Date   CAD in native artery 01/01/2021   Prior LAD PCI.  Currently no symptoms of ischemia.  Encouraged him to do some light exercising.  Continue aspirin, carvedilol, and rosuvastatin.   Cataract    CKD (chronic kidney disease), stage III ([place]) 08/24/2020   Coronary artery disease    Diabetes mellitus without complication (HCC)    on meds   Diabetic retinopathy ([place])    Hernia, inguinal    Hypertension    on meds   Hypertensive retinopathy    NSVT (nonsustained ventricular  tachycardia) 08/24/2020   Uncontrolled type 2 diabetes mellitus 11/11/2018   Past Surgical History:  Procedure Laterality Date   CORONARY STENT INTERVENTION N/A 11/15/2018   Procedure: CORONARY STENT INTERVENTION;  Surgeon: [name], MD;  Location: [facility];  Service: Cardiovascular;  Laterality: N/A;   HEMORROIDECTOMY     RIGHT HEART CATH N/A 07/18/2020   Procedure: RIGHT HEART CATH;  Surgeon: [name], MD;  Location: [facility];  Service: Cardiovascular;  Laterality: N/A;   RIGHT/LEFT HEART CATH AND CORONARY ANGIOGRAPHY N/A 11/15/2018   Procedure: RIGHT/LEFT HEART CATH AND CORONARY ANGIOGRAPHY;  Surgeon: [name], MD;  Location: [facility];  Service: Cardiovascular;  Laterality: N/A;   WISDOM TOOTH EXTRACTION      FAMILY HISTORY Family History  Problem Relation Age of Onset   Hypertension Mother    Diabetes Mellitus II Mother    Arrhythmia Mother    Heart disease Mother    Hypertension Sister    Hypertension Brother    Heart disease Brother    Kidney disease Brother    Hypertension Other    Diabetes Mellitus II Other        All 9 sibblings have HTN   Colon cancer Neg Hx    Colon polyps Neg Hx    Esophageal cancer Neg Hx    Stomach cancer Neg Hx    Rectal cancer Neg Hx    SOCIAL HISTORY Social History   Tobacco Use   Smoking status: Never   Smokeless tobacco: Never  Vaping Use   Vaping Use: Never used  Substance Use Topics   Alcohol use: Not Currently   Drug use: Never       OPHTHALMIC EXAM:  Base Eye Exam     Visual Acuity (Snellen - Linear)       Right Left   Dist Hooks 20/30 - 20/80 -         Tonometry (Tonopen, 1:31 PM)       Right Left   Pressure 11 11         Pupils       Dark Light Shape React APD   Right 4 3 Round Brisk 0   Left 4 3 Round Brisk 0         Visual Fields       Left Right   Restrictions Partial outer superior nasal deficiency Partial outer superior temporal  deficiency         Extraocular Movement       Right Left    Full Full         Neuro/Psych     Oriented x3: Yes   Mood/Affect: Normal         Dilation     Both eyes: 1.0%  Mydriacyl, 2.5% Phenylephrine @ 1:31 PM           Slit Lamp and Fundus Exam     Slit Lamp Exam       Right Left   Lids/Lashes Dermatochalasis - upper lid Dermatochalasis - upper lid   Conjunctiva/Sclera Melanosis Melanosis   Cornea arcus arcus   Anterior Chamber deep and clear deep and clear   Iris Round and poorly dilated, No NVI Round and dilated, No NVI   Lens 2-3+ Nuclear sclerosis, 2-3+ Cortical cataract 2-3+ Nuclear sclerosis, 2-3+ Cortical cataract   Vitreous Vitreous syneresis Vitreous syneresis         Fundus Exam       Right Left   Disc Pink and Sharp, no NVD Pink and Sharp, no NVD   C/D Ratio 0.6 0.4   Macula Blunted foveal reflex, central edema--slightly improved, scattered MA/exudate, +DBH good foveal reflex, scattered MA/DBH, +edema -- improved, +exudates and CWS -- improved   Vessels attenuated, tortuous, focal NV IN arcades--regressing attenuated, Tortuous   Periphery Attached, 360 MA/DBH greatest posteriorly, light 360 PRP changes Attached, 360 DBH           IMAGING AND PROCEDURES  Imaging and Procedures for 06/28/2021  OCT, Retina - OU - Both Eyes       Right Eye Quality was good. Central Foveal Thickness: 283. Progression has improved. Findings include abnormal foveal contour, intraretinal fluid, intraretinal hyper-reflective material, vitreomacular adhesion , no SRF (Interval improvement in IRF/IRHM and foveal profile greatest nasal mac).   Left Eye Quality was good. Central Foveal Thickness: 214. Progression has improved. Findings include intraretinal fluid, no SRF, intraretinal hyper-reflective material, vitreomacular adhesion , normal foveal contour (interval improvement in IRF/IRHM temporal macula).   Notes *Images captured and stored on  drive  Diagnosis / Impression:  Abnormal foveal contour and +DME OU OD: Interval improvement in IRF/IRHM and foveal profile greatest nasal mac OS: interval improvement in IRF/IRHM temporal macula  Clinical management:  See below  Abbreviations: NFP - Normal foveal profile. CME - cystoid macular edema. PED - pigment epithelial detachment. IRF - intraretinal fluid. SRF - subretinal fluid. EZ - ellipsoid zone. ERM - epiretinal membrane. ORA - outer retinal atrophy. ORT - outer retinal tubulation. SRHM - subretinal hyper-reflective material. IRHM - intraretinal hyper-reflective material      Intravitreal Injection, Pharmacologic Agent - OD - Right Eye       Time Out 06/28/2021. 1:46 PM. Confirmed correct patient, procedure, site, and patient consented.   Anesthesia Topical anesthesia was used. Anesthetic medications included Lidocaine 2%, Proparacaine 0.5%.   Procedure Preparation included 5% betadine to ocular surface, eyelid speculum. A supplied needle was used.   Injection: 1.25 mg Bevacizumab 1.60m/0.05ml   Route: Intravitreal, Site: Right Eye   NDC:: [id number] Lot: [id number] , Expiration date: 08/14/2021, Waste: 0 mL   Post-op Post injection exam found visual acuity of at least counting fingers. The patient tolerated the procedure well. There were no complications. The patient received written and verbal post procedure care education. Post injection medications were not given.      Intravitreal Injection, Pharmacologic Agent - OS - Left Eye       Time Out 06/28/2021. 1:47 PM. Confirmed correct patient, procedure, site, and patient consented.   Anesthesia Topical anesthesia was used. Anesthetic medications included Lidocaine 2%, Proparacaine 0.5%.   Procedure Preparation included 5% betadine to ocular surface, eyelid speculum. A (32g) needle was used.   Injection: 1.25 mg Bevacizumab 1.222m0.05ml   Route: Intravitreal, Site:  Left Eye   [place]: [id number], Lot:  [id number], Expiration date: 08/20/2021, Waste: 0.05 mL   Post-op Post injection exam found visual acuity of at least counting fingers. The patient tolerated the procedure well. There were no complications. The patient received written and verbal post procedure care education. Post injection medications were not given.             ASSESSMENT/PLAN:    ICD-10-CM   1. Proliferative diabetic retinopathy of right eye with macular edema associated with type 2 diabetes mellitus (HCC)  Z02.5852 Intravitreal Injection, Pharmacologic Agent - OD - Right Eye    Bevacizumab (AVASTIN) SOLN 1.25 mg    2. Severe nonproliferative diabetic retinopathy of left eye with macular edema associated with type 2 diabetes mellitus (HCC)  D78.2423 Intravitreal Injection, Pharmacologic Agent - OS - Left Eye    Bevacizumab (AVASTIN) SOLN 1.25 mg    3. Retinal edema  H35.81 OCT, Retina - OU - Both Eyes    4. Essential hypertension  I10     5. Hypertensive retinopathy of both eyes  H35.033     6. Combined forms of age-related cataract of both eyes  H25.813      1-3. Proliferative diabetic retinopathy, OD  - severe NPDR OS  - s/p IVA OU #1 (06.06.22), #2 (07.08.22), #3 (08.05.22), #4 (09.02.22), #5 (09.30.22)  - s/p PRP OD (06.10.22) - exam shows central edema OU, scattered IRH -- improving - FA (06.06.22) shows OD: focal NVE IN arcades; OS: no NV OS; late leaking MA's OU -- s/p PRP OD 6.10.22 - OCT shows OD: Interval improvement in IRF/IRHM and foveal profile greatest nasal mac; OS: interval improvement in IRF/IRHM temporal macula - recommend IVA OU #6 today, 10.28.22 - pt wishes to proceed - RBA of procedure discussed, questions answered - informed consent obtained and signed - see procedure note - IVA informed consent obtained and signed (OU), 06.06.22 - f/u 4 weeks for DFE/OCT, possible injection(s)  4,5. Hypertensive retinopathy OU - discussed importance of tight BP control - monitor    6. Mixed  Cataract OU - The symptoms of cataract, surgical options, and treatments and risks were discussed with patient. - discussed diagnosis and progression - not yet visually significant - monitor for now  Ophthalmic Meds Ordered this visit:  Meds ordered this encounter  Medications   Bevacizumab (AVASTIN) SOLN 1.25 mg   Bevacizumab (AVASTIN) SOLN 1.25 mg       Return in about 4 weeks (around 07/26/2021) for f/u PDR OD, DFE, OCT.  There are no Patient Instructions on file for this visit.  This document serves as a record of services personally performed by Gardiner Sleeper, MD, PhD. It was created on their behalf by Leonie Douglas, an ophthalmic technician. The creation of this record is the provider's dictation and/or activities during the visit.    Electronically signed by: Leonie Douglas COA, 06/30/21  4:21 AM  This document serves as a record of services personally performed by Gardiner Sleeper, MD, PhD. It was created on their behalf by San Jetty. Owens Shark, OA an ophthalmic technician. The creation of this record is the provider's dictation and/or activities during the visit.    Electronically signed by: San Jetty. Owens Shark, [place] 10.28.2022 4:21 AM   Gardiner Sleeper, M.D., Ph.D. Diseases & Surgery of the Retina and [place] 06/28/2021  I have reviewed the above documentation for accuracy and completeness, and I agree with the above. Gardiner Sleeper, M.D.,  Ph.D. 06/30/21 4:21 AM  Abbreviations: M myopia (nearsighted); A astigmatism; H hyperopia (farsighted); P presbyopia; Mrx spectacle prescription;  CTL contact lenses; OD right eye; OS left eye; OU both eyes  XT exotropia; ET esotropia; PEK punctate epithelial keratitis; PEE punctate epithelial erosions; DES dry eye syndrome; MGD meibomian gland dysfunction; ATs artificial tears; PFAT's preservative free artificial tears; [place] nuclear sclerotic cataract; PSC posterior subcapsular cataract; ERM epi-retinal  membrane; PVD posterior vitreous detachment; RD retinal detachment; DM diabetes mellitus; DR diabetic retinopathy; NPDR non-proliferative diabetic retinopathy; PDR proliferative diabetic retinopathy; CSME clinically significant macular edema; DME diabetic macular edema; dbh dot blot hemorrhages; CWS cotton wool spot; POAG primary open angle glaucoma; C/D cup-to-disc ratio; HVF humphrey visual field; GVF goldmann visual field; OCT optical coherence tomography; IOP intraocular pressure; BRVO Branch retinal vein occlusion; CRVO central retinal vein occlusion; CRAO central retinal artery occlusion; BRAO branch retinal artery occlusion; RT retinal tear; SB scleral buckle; PPV pars plana vitrectomy; VH Vitreous hemorrhage; PRP panretinal laser photocoagulation; IVK intravitreal kenalog; VMT vitreomacular traction; MH Macular hole;  NVD neovascularization of the disc; NVE neovascularization elsewhere; AREDS age related eye disease study; ARMD age related macular degeneration; POAG primary open angle glaucoma; EBMD epithelial/anterior basement membrane dystrophy; ACIOL anterior chamber intraocular lens; IOL intraocular lens; PCIOL posterior chamber intraocular lens; Phaco/IOL phacoemulsification with intraocular lens placement; [place] photorefractive keratectomy; LASIK laser assisted in situ keratomileusis; HTN hypertension; DM diabetes mellitus; COPD chronic obstructive pulmonary disease

## 2021-06-28 ENCOUNTER — Other Ambulatory Visit: Payer: Self-pay

## 2021-06-28 ENCOUNTER — Ambulatory Visit (INDEPENDENT_AMBULATORY_CARE_PROVIDER_SITE_OTHER): Payer: Medicare Other | Admitting: Ophthalmology

## 2021-06-28 DIAGNOSIS — H3581 Retinal edema: Secondary | ICD-10-CM

## 2021-06-28 DIAGNOSIS — E113412 Type 2 diabetes mellitus with severe nonproliferative diabetic retinopathy with macular edema, left eye: Secondary | ICD-10-CM

## 2021-06-28 DIAGNOSIS — H35033 Hypertensive retinopathy, bilateral: Secondary | ICD-10-CM | POA: Diagnosis not present

## 2021-06-28 DIAGNOSIS — H25813 Combined forms of age-related cataract, bilateral: Secondary | ICD-10-CM

## 2021-06-28 DIAGNOSIS — E113511 Type 2 diabetes mellitus with proliferative diabetic retinopathy with macular edema, right eye: Secondary | ICD-10-CM | POA: Diagnosis not present

## 2021-06-28 DIAGNOSIS — I10 Essential (primary) hypertension: Secondary | ICD-10-CM

## 2021-06-30 ENCOUNTER — Encounter (INDEPENDENT_AMBULATORY_CARE_PROVIDER_SITE_OTHER): Payer: Self-pay | Admitting: Ophthalmology

## 2021-06-30 MED ORDER — BEVACIZUMAB CHEMO INJECTION 1.25MG/0.05ML SYRINGE FOR KALEIDOSCOPE
1.2500 mg | INTRAVITREAL | Status: AC | PRN
  Administered 2021-06-28: 1.25 mg via INTRAVITREAL

## 2021-07-01 ENCOUNTER — Encounter: Payer: Self-pay | Admitting: Hematology & Oncology

## 2021-07-01 ENCOUNTER — Inpatient Hospital Stay (HOSPITAL_BASED_OUTPATIENT_CLINIC_OR_DEPARTMENT_OTHER): Payer: Medicare Other | Admitting: Hematology & Oncology

## 2021-07-01 ENCOUNTER — Other Ambulatory Visit: Payer: Self-pay

## 2021-07-01 ENCOUNTER — Inpatient Hospital Stay: Payer: Medicare Other | Attending: Hematology & Oncology

## 2021-07-01 VITALS — BP 190/96 | HR 63 | Temp 97.7°F | Resp 16 | Wt 143.0 lb

## 2021-07-01 DIAGNOSIS — E1140 Type 2 diabetes mellitus with diabetic neuropathy, unspecified: Secondary | ICD-10-CM

## 2021-07-01 DIAGNOSIS — R590 Localized enlarged lymph nodes: Secondary | ICD-10-CM | POA: Insufficient documentation

## 2021-07-01 DIAGNOSIS — D649 Anemia, unspecified: Secondary | ICD-10-CM | POA: Insufficient documentation

## 2021-07-01 DIAGNOSIS — Z79899 Other long term (current) drug therapy: Secondary | ICD-10-CM

## 2021-07-01 DIAGNOSIS — I509 Heart failure, unspecified: Secondary | ICD-10-CM | POA: Insufficient documentation

## 2021-07-01 DIAGNOSIS — R222 Localized swelling, mass and lump, trunk: Secondary | ICD-10-CM | POA: Diagnosis not present

## 2021-07-01 DIAGNOSIS — Z7982 Long term (current) use of aspirin: Secondary | ICD-10-CM

## 2021-07-01 DIAGNOSIS — Z794 Long term (current) use of insulin: Secondary | ICD-10-CM | POA: Insufficient documentation

## 2021-07-01 DIAGNOSIS — D472 Monoclonal gammopathy: Secondary | ICD-10-CM | POA: Diagnosis present

## 2021-07-01 DIAGNOSIS — Z7189 Other specified counseling: Secondary | ICD-10-CM | POA: Insufficient documentation

## 2021-07-01 HISTORY — DX: Localized enlarged lymph nodes: R59.0

## 2021-07-01 HISTORY — DX: Other specified counseling: Z71.89

## 2021-07-01 HISTORY — DX: Monoclonal gammopathy: D47.2

## 2021-07-01 LAB — CBC WITH DIFFERENTIAL (CANCER CENTER ONLY)
Abs Immature Granulocytes: 0.01 10*3/uL (ref 0.00–0.07)
Basophils Absolute: 0 10*3/uL (ref 0.0–0.1)
Basophils Relative: 1 %
Eosinophils Absolute: 0.1 10*3/uL (ref 0.0–0.5)
Eosinophils Relative: 2 %
HCT: 32.8 % — ABNORMAL LOW (ref 39.0–52.0)
Hemoglobin: 11.4 g/dL — ABNORMAL LOW (ref 13.0–17.0)
Immature Granulocytes: 0 %
Lymphocytes Relative: 17 %
Lymphs Abs: 1 10*3/uL (ref 0.7–4.0)
MCH: 32.3 pg (ref 26.0–34.0)
MCHC: 34.8 g/dL (ref 30.0–36.0)
MCV: 92.9 fL (ref 80.0–100.0)
Monocytes Absolute: 0.3 10*3/uL (ref 0.1–1.0)
Monocytes Relative: 5 %
Neutro Abs: 4.5 10*3/uL (ref 1.7–7.7)
Neutrophils Relative %: 75 %
Platelet Count: 146 10*3/uL — ABNORMAL LOW (ref 150–400)
RBC: 3.53 MIL/uL — ABNORMAL LOW (ref 4.22–5.81)
RDW: 11.7 % (ref 11.5–15.5)
WBC Count: 6 10*3/uL (ref 4.0–10.5)
nRBC: 0 % (ref 0.0–0.2)

## 2021-07-01 LAB — CMP (CANCER CENTER ONLY)
ALT: 19 U/L (ref 0–44)
AST: 22 U/L (ref 15–41)
Albumin: 3.9 g/dL (ref 3.5–5.0)
Alkaline Phosphatase: 77 U/L (ref 38–126)
Anion gap: 5 (ref 5–15)
BUN: 24 mg/dL — ABNORMAL HIGH (ref 8–23)
CO2: 32 mmol/L (ref 22–32)
Calcium: 9.4 mg/dL (ref 8.9–10.3)
Chloride: 101 mmol/L (ref 98–111)
Creatinine: 1.15 mg/dL (ref 0.61–1.24)
GFR, Estimated: >60 mL/min (ref >60)
Glucose, Bld: 285 mg/dL — ABNORMAL HIGH (ref 70–99)
Potassium: 3.8 mmol/L (ref 3.5–5.1)
Sodium: 138 mmol/L (ref 135–145)
Total Bilirubin: 0.9 mg/dL (ref 0.3–1.2)
Total Protein: 7.9 g/dL (ref 6.5–8.1)

## 2021-07-01 LAB — LACTATE DEHYDROGENASE: LDH: 160 U/L (ref 98–192)

## 2021-07-01 NOTE — Progress Notes (Signed)
Referral MD  Reason for Referral: IgG kappa MGUS; right supraclavicular lymph node  Chief Complaint  Patient presents with   New Patient (Initial Visit)  : I am not sure why I am here.  HPI: [name] is a really nice 70 year old African-American male.  He unfortunately has quite a few health issues.  He has diabetes.  He has diabetic neuropathy.  He has diabetic retinopathy.  He has congestive heart failure.  He is on quite a few medications.  Unfortunately he did not take his medications this morning.  Blood pressure was quite high.  Blood sugar was quite high.  He is followed by Dr. Carlis Earl Calderon.  Dr. Carlis Earl Calderon has done a very thorough job on him.  Dr. Carlis Earl Calderon found that he had a lymph node in the right supraclavicular region.  He had ultrasound done on 04/10/2021.  This showed a 2.4 x 1.2 x 2.1 cm right supraclavicular mass.  It was felt that this could be biopsied if needed.  A SPEP was done.  This showed a 0.5 g/dL monoclonal spike.  IFE showed this to be IgG kappa.  Again, [name] has multiple health issues.  He does not work.  He does not smoke.  He does not drink.  He said there is no history in the family of cancer.  I think he had a colonoscopy earlier this year for anemia.  He has not lost weight.  He says appetite is doing quite well.  He has had no rashes.  There has been no leg swelling.  He has had no cough or shortness of breath.  He is chronically fatigued.  He says that he has not had problems with COVID.  He has had his vaccinations.  There is been no issues with past surgery.  He says he does have a hernia in the left inguinal area that he has had for several years.  Overall, I would say his performance status is probably ECOG 2. :   Past Surgical History:  Procedure Laterality Date   CORONARY STENT INTERVENTION N/A 11/15/2018   Procedure: CORONARY STENT INTERVENTION;  Surgeon: [name], MD;  Location: [facility];  Service: Cardiovascular;   Laterality: N/A;   HEMORROIDECTOMY     RIGHT HEART CATH N/A 07/18/2020   Procedure: RIGHT HEART CATH;  Surgeon: [name], MD;  Location: [facility];  Service: Cardiovascular;  Laterality: N/A;   RIGHT/LEFT HEART CATH AND CORONARY ANGIOGRAPHY N/A 11/15/2018   Procedure: RIGHT/LEFT HEART CATH AND CORONARY ANGIOGRAPHY;  Surgeon: [name], MD;  Location: [facility];  Service: Cardiovascular;  Laterality: N/A;   WISDOM TOOTH EXTRACTION    :   Current Outpatient Medications:    APPLE CIDER VINEGAR PO, Take 1 tablet by mouth daily at 6 (six) AM., Disp: , Rfl:    aspirin EC 81 MG tablet, Take 1 tablet (81 mg total) by mouth daily. Swallow whole., Disp: 90 tablet, Rfl: 3   carvedilol (COREG) 12.5 MG tablet, Take 1 tablet (12.5 mg total) by mouth 2 (two) times daily with a meal., Disp: 180 tablet, Rfl: 3   ELDERBERRY PO, Take 100 mg by mouth daily., Disp: , Rfl:    empagliflozin (JARDIANCE) 10 MG TABS tablet, Take 1 tablet (10 mg total) by mouth daily before breakfast., Disp: 90 tablet, Rfl: 3   ferrous sulfate 325 (65 FE) MG tablet, Take 1 tablet (325 mg total) by mouth daily with breakfast., Disp: 30 tablet, Rfl:  3   furosemide (LASIX) 40 MG tablet, Take 1 tablet (40 mg total) by mouth daily., Disp: 90 tablet, Rfl: 3   glucose blood (FREESTYLE TEST STRIPS) test strip, Use as instructed, Disp: 100 each, Rfl: 12   insulin aspart (NOVOLOG FLEXPEN) 100 UNIT/ML FlexPen, Before each meal 3 times a day, 140-199 - 2 units, 200-250 - 4 units, 251-299 - 6 units,  300-349 - 8 units,  350 or above 10 units. Insulin PEN if approved, provide syringes and needles if needed., Disp: 15 mL, Rfl: 2   Insulin Pen Needle (NOVOFINE PEN NEEDLE) 32G X 6 MM MISC, USE AS DIRECTED, Disp: 100 each, Rfl: 6   LEVEMIR FLEXTOUCH 100 UNIT/ML FlexPen, Inject 8 Units into the skin 2 (two) times daily., Disp: 15 mL, Rfl: 1   nitroGLYCERIN (NITROSTAT) 0.4 MG SL tablet, Place 1 tablet (0.4 mg total)  under the tongue every 5 (five) minutes as needed for chest pain., Disp: 25 tablet, Rfl: 3   Probiotic Product (PROBIOTIC MULTI-ENZYME) TABS, Take 3 tablets by mouth daily at 6 (six) AM., Disp: , Rfl:    rosuvastatin (CRESTOR) 40 MG tablet, Take 1 tablet (40 mg total) by mouth daily at 6 PM., Disp: 90 tablet, Rfl: 3   sacubitril-valsartan (ENTRESTO) 97-103 MG, Take 1 tablet by mouth 2 (two) times daily., Disp: 180 tablet, Rfl: 3   spironolactone (ALDACTONE) 25 MG tablet, Take 1 tablet (25 mg total) by mouth daily., Disp: 90 tablet, Rfl: 3   Turmeric 500 MG TABS, Take 2 tablets by mouth daily at 6 (six) AM., Disp: , Rfl: :  :  No Known Allergies:   Family History  Problem Relation Age of Onset   Hypertension Mother    Diabetes Mellitus II Mother    Arrhythmia Mother    Heart disease Mother    Hypertension Sister    Hypertension Brother    Heart disease Brother    Kidney disease Brother    Hypertension Other    Diabetes Mellitus II Other        All 9 sibblings have HTN   Colon cancer Neg Hx    Colon polyps Neg Hx    Esophageal cancer Neg Hx    Stomach cancer Neg Hx    Rectal cancer Neg Hx   :   Social History   Socioeconomic History   Marital status: Married    Spouse name: Not on file   Number of children: Not on file   Years of education: Not on file   Highest education level: Not on file  Occupational History   Not on file  Tobacco Use   Smoking status: Never   Smokeless tobacco: Never  Vaping Use   Vaping Use: Never used  Substance and Sexual Activity   Alcohol use: Not Currently   Drug use: Never   Sexual activity: Not on file  Other Topics Concern   Not on file  Social History Narrative   Not on file   Social Determinants of Health   Financial Resource Strain: Medium Risk   Difficulty of Paying Living Expenses: Somewhat hard  Food Insecurity: No Food Insecurity   Worried About Running Out of Food in the Last Year: Never true   Ran Out of Food in the  Last Year: Never true  Transportation Needs: Unmet Transportation Needs   Lack of Transportation (Medical): Yes   Lack of Transportation (Non-Medical): Yes  Physical Activity: Not on file  Stress: Not on file  Social Connections:  Not on file  Intimate Partner Violence: Not on file  :  Review of Systems  Constitutional:  Positive for malaise/fatigue.  HENT: Negative.    Eyes: Negative.   Respiratory:  Positive for shortness of breath.   Cardiovascular:  Positive for palpitations.  Gastrointestinal: Negative.   Genitourinary:  Positive for frequency.  Musculoskeletal:  Positive for myalgias.  Skin: Negative.   Neurological:  Positive for tingling and weakness.  Endo/Heme/Allergies: Negative.   Psychiatric/Behavioral: Negative.      Exam: @IPVITALS @ This is a thin African-American male in no obvious distress.  Vital signs show temperature of 97.7.  Pulse 63.  Blood pressure 100/96.  Weight is 143 pounds.  Head and neck exam shows a palpable lymph node in the right supraclavicular region.  This is mobile and not tender.  It probably measures about 2 x 1.5 cm.  There is no other adenopathy in the neck or supraclavicular region.  He has no oral lesions.  Lungs are with decent breath sounds bilaterally.  Cardiac exam regular rate and rhythm with no murmurs, rubs or bruits.  Axillary exam shows no bilateral axillary adenopathy.  Abdomen is soft.  He has good bowel sounds.  There is no fluid wave.  There is no palpable liver or spleen tip.  It is hard to tell if there is adenopathy or hernia in the left inguinal region.  Back exam shows no tenderness over the spine, ribs or hips.  Extremities shows no clubbing, cyanosis or edema.  Neurological exam shows no focal neurological deficits.  Skin exam shows no rashes, ecchymoses or petechia.   Recent Labs    07/01/21 1404  WBC 6.0  HGB 11.4*  HCT 32.8*  PLT 146*    Recent Labs    07/01/21 1404  NA 138  K 3.8  CL 101  CO2 32  GLUCOSE  285*  BUN 24*  CREATININE 1.15  CALCIUM 9.4    Blood smear review: None  Pathology: None    Assessment and Plan: [name] is a very nice 70 year old African-American male.  He has diabetes.  He has congestive heart failure.  He has this minimal monoclonal spike in his blood.  He has this possible lymph node in the right supraclavicular region.  It is hard to tell what might be going on.  The monoclonal spike in his blood could be reactive.  We will do a 24-hour urine on him to see if there is any protein in his urine.  It would not surprise me if he has protein in his urine given his diabetes.  He is a little bit anemic.  I suspect the anemia is probably from his diabetes.  We will get a CT scan to see if there is any obvious adenopathy in the neck or body.  If so, then we will see about getting a biopsy of the right supraclavicular lymph node.  Again, it is not clear at all at all as to what is going on.  I really think that would have to get tissue from somewhere.  Ultimately, he might need to have a bone marrow biopsy done.  I spent a good hour with him.  He is very very nice.  He has a strong faith.  We gave him a prayer blanket  Once I have the results back from the CT scan, and blood work, and 24-hour urine, then we will figure out what the neck step will be.  I think what ever we are dealing  with, our goal clearly is giving quality of life.

## 2021-07-02 LAB — IGG, IGA, IGM
IgA: 210 mg/dL (ref 61–437)
IgG (Immunoglobin G), Serum: 2197 mg/dL — ABNORMAL HIGH (ref 603–1613)
IgM (Immunoglobulin M), Srm: 55 mg/dL (ref 20–172)

## 2021-07-02 LAB — KAPPA/LAMBDA LIGHT CHAINS
Kappa free light chain: 106.3 mg/L — ABNORMAL HIGH (ref 3.3–19.4)
Kappa, lambda light chain ratio: 5.09 — ABNORMAL HIGH (ref 0.26–1.65)
Lambda free light chains: 20.9 mg/L (ref 5.7–26.3)

## 2021-07-03 ENCOUNTER — Telehealth: Payer: Self-pay | Admitting: *Deleted

## 2021-07-03 NOTE — Telephone Encounter (Signed)
No 07/01/21 LOS

## 2021-07-04 LAB — PROTEIN ELECTROPHORESIS, SERUM, WITH REFLEX
A/G Ratio: 1 (ref 0.7–1.7)
Albumin ELP: 3.9 g/dL (ref 2.9–4.4)
Alpha-1-Globulin: 0.2 g/dL (ref 0.0–0.4)
Alpha-2-Globulin: 0.6 g/dL (ref 0.4–1.0)
Beta Globulin: 0.9 g/dL (ref 0.7–1.3)
Gamma Globulin: 2.2 g/dL — ABNORMAL HIGH (ref 0.4–1.8)
Globulin, Total: 3.9 g/dL (ref 2.2–3.9)
M-Spike, %: 1.5 g/dL — ABNORMAL HIGH
SPEP Interpretation: 0
Total Protein ELP: 7.8 g/dL (ref 6.0–8.5)

## 2021-07-04 LAB — IMMUNOFIXATION REFLEX, SERUM
IgA: 210 mg/dL (ref 61–437)
IgG (Immunoglobin G), Serum: 2341 mg/dL — ABNORMAL HIGH (ref 603–1613)
IgM (Immunoglobulin M), Srm: 60 mg/dL (ref 20–172)

## 2021-07-19 ENCOUNTER — Encounter (HOSPITAL_BASED_OUTPATIENT_CLINIC_OR_DEPARTMENT_OTHER): Payer: Self-pay

## 2021-07-19 ENCOUNTER — Ambulatory Visit (HOSPITAL_BASED_OUTPATIENT_CLINIC_OR_DEPARTMENT_OTHER)
Admission: RE | Admit: 2021-07-19 | Discharge: 2021-07-19 | Disposition: A | Discharge status: Home / Self Care | Payer: Medicare Other | Source: Ambulatory Visit | Attending: Hematology & Oncology | Admitting: Hematology & Oncology

## 2021-07-19 ENCOUNTER — Other Ambulatory Visit: Payer: Self-pay

## 2021-07-19 ENCOUNTER — Inpatient Hospital Stay: Payer: Medicare Other | Attending: Hematology & Oncology

## 2021-07-19 DIAGNOSIS — D472 Monoclonal gammopathy: Secondary | ICD-10-CM

## 2021-07-19 DIAGNOSIS — R590 Localized enlarged lymph nodes: Secondary | ICD-10-CM | POA: Diagnosis not present

## 2021-07-19 MED ORDER — IOHEXOL 300 MG/ML  SOLN
100.0000 mL | Freq: Once | INTRAMUSCULAR | Status: AC | PRN
  Administered 2021-07-19: 100 mL via INTRAVENOUS

## 2021-07-22 ENCOUNTER — Encounter: Payer: Self-pay | Admitting: *Deleted

## 2021-07-22 LAB — UPEP/UIFE/LIGHT CHAINS/TP, 24-HR UR
% BETA, Urine: 17.3 %
ALPHA 1 URINE: 4.5 %
Albumin, U: 54.3 %
Alpha 2, Urine: 4.6 %
Free Kappa Lt Chains,Ur: 98.59 mg/L — ABNORMAL HIGH (ref 1.17–86.46)
Free Kappa/Lambda Ratio: 10.74 (ref 1.83–14.26)
Free Lambda Lt Chains,Ur: 9.18 mg/L (ref 0.27–15.21)
GAMMA GLOBULIN URINE: 19.4 %
M-SPIKE %, Urine: 9.5 % — ABNORMAL HIGH
M-Spike, Mg/24 Hr: 19 mg/24 hr — ABNORMAL HIGH
Total Protein, Urine-Ur/day: 201 mg/24 hr — ABNORMAL HIGH (ref 30–150)
Total Protein, Urine: 23.7 mg/dL
Total Volume: 850

## 2021-07-24 NOTE — Progress Notes (Shared)
[facility] Note  07/29/2021     CHIEF COMPLAINT Patient presents for No chief complaint on file.   HISTORY OF PRESENT ILLNESS: Earl Calderon is a 70 y.o. male who presents to the clinic today for:     Pt states he cant tell any improvement in vision  Referring physician: [name], MD [address]  [address] [place],  [place] [address]  HISTORICAL INFORMATION:   Selected notes from the MEDICAL RECORD NUMBER Referred by Dr. [name] for severe NPDR OU   CURRENT MEDICATIONS: No current outpatient medications on file. (Ophthalmic Drugs)   No current facility-administered medications for this visit. (Ophthalmic Drugs)   Current Outpatient Medications (Other)  Medication Sig   APPLE CIDER VINEGAR PO Take 1 tablet by mouth daily at 6 (six) AM.   aspirin EC 81 MG tablet Take 1 tablet (81 mg total) by mouth daily. Swallow whole.   carvedilol (COREG) 12.5 MG tablet Take 1 tablet (12.5 mg total) by mouth 2 (two) times daily with a meal.   ELDERBERRY PO Take 100 mg by mouth daily.   empagliflozin (JARDIANCE) 10 MG TABS tablet Take 1 tablet (10 mg total) by mouth daily before breakfast.   ferrous sulfate 325 (65 FE) MG tablet Take 1 tablet (325 mg total) by mouth daily with breakfast.   furosemide (LASIX) 40 MG tablet Take 1 tablet (40 mg total) by mouth daily.   glucose blood (FREESTYLE TEST STRIPS) test strip Use as instructed   insulin aspart (NOVOLOG FLEXPEN) 100 UNIT/ML FlexPen Before each meal 3 times a day, 140-199 - 2 units, 200-250 - 4 units, 251-299 - 6 units,  300-349 - 8 units,  350 or above 10 units. Insulin PEN if approved, provide syringes and needles if needed.   Insulin Pen Needle (NOVOFINE PEN NEEDLE) 32G X 6 MM MISC USE AS DIRECTED   LEVEMIR FLEXTOUCH 100 UNIT/ML FlexPen Inject 8 Units into the skin 2 (two) times daily.   nitroGLYCERIN (NITROSTAT) 0.4 MG SL tablet Place 1 tablet (0.4 mg total) under the tongue every  5 (five) minutes as needed for chest pain.   Probiotic Product (PROBIOTIC MULTI-ENZYME) TABS Take 3 tablets by mouth daily at 6 (six) AM.   rosuvastatin (CRESTOR) 40 MG tablet Take 1 tablet (40 mg total) by mouth daily at 6 PM.   sacubitril-valsartan (ENTRESTO) 97-103 MG Take 1 tablet by mouth 2 (two) times daily.   spironolactone (ALDACTONE) 25 MG tablet Take 1 tablet (25 mg total) by mouth daily.   Turmeric 500 MG TABS Take 2 tablets by mouth daily at 6 (six) AM.   No current facility-administered medications for this visit. (Other)   REVIEW OF SYSTEMS:   ALLERGIES No Known Allergies  PAST MEDICAL HISTORY Past Medical History:  Diagnosis Date   CAD in native artery 01/01/2021   Prior LAD PCI.  Currently no symptoms of ischemia.  Encouraged him to do some light exercising.  Continue aspirin, carvedilol, and rosuvastatin.   Cataract    CKD (chronic kidney disease), stage III ([place]) 08/24/2020   Coronary artery disease    Diabetes mellitus without complication ([place])    on meds   Diabetic retinopathy ([place])    Goals of care, counseling/discussion 07/01/2021   Hernia, inguinal    Hypertension    on meds   Hypertensive retinopathy    Lymphadenopathy, cervical 07/01/2021   MGUS (monoclonal gammopathy of unknown significance) 07/01/2021   NSVT (nonsustained ventricular tachycardia)  08/24/2020   Uncontrolled type 2 diabetes mellitus 11/11/2018   Past Surgical History:  Procedure Laterality Date   CORONARY STENT INTERVENTION N/A 11/15/2018   Procedure: CORONARY STENT INTERVENTION;  Surgeon: [name], MD;  Location: [facility];  Service: Cardiovascular;  Laterality: N/A;   HEMORROIDECTOMY     RIGHT HEART CATH N/A 07/18/2020   Procedure: RIGHT HEART CATH;  Surgeon: [name], MD;  Location: [facility];  Service: Cardiovascular;  Laterality: N/A;   RIGHT/LEFT HEART CATH AND CORONARY ANGIOGRAPHY N/A 11/15/2018   Procedure: RIGHT/LEFT HEART CATH AND  CORONARY ANGIOGRAPHY;  Surgeon: [name], MD;  Location: [facility];  Service: Cardiovascular;  Laterality: N/A;   WISDOM TOOTH EXTRACTION      FAMILY HISTORY Family History  Problem Relation Age of Onset   Hypertension Mother    Diabetes Mellitus II Mother    Arrhythmia Mother    Heart disease Mother    Hypertension Sister    Hypertension Brother    Heart disease Brother    Kidney disease Brother    Hypertension Other    Diabetes Mellitus II Other        All 9 sibblings have HTN   Colon cancer Neg Hx    Colon polyps Neg Hx    Esophageal cancer Neg Hx    Stomach cancer Neg Hx    Rectal cancer Neg Hx    SOCIAL HISTORY Social History   Tobacco Use   Smoking status: Never   Smokeless tobacco: Never  Vaping Use   Vaping Use: Never used  Substance Use Topics   Alcohol use: Not Currently   Drug use: Never       OPHTHALMIC EXAM:  Not recorded    IMAGING AND PROCEDURES  Imaging and Procedures for 07/29/2021           ASSESSMENT/PLAN:    ICD-10-CM   1. Proliferative diabetic retinopathy of right eye with macular edema associated with type 2 diabetes mellitus ([place])  E11.3511     2. Severe nonproliferative diabetic retinopathy of left eye with macular edema associated with type 2 diabetes mellitus ([place])  H40.3524     3. Retinal edema  H35.81     4. Essential hypertension  I10     5. Hypertensive retinopathy of both eyes  H35.033     6. Combined forms of age-related cataract of both eyes  H25.813       1-3. Proliferative diabetic retinopathy, OD  - severe NPDR OS  - s/p IVA OU #1 (06.06.22), #2 (07.08.22), #3 (08.05.22), #4 (09.02.22), #5 (09.30.22), #6 (10.28.22)  - s/p PRP OD (06.10.22) - exam shows central edema OU, scattered IRH -- improving - FA (06.06.22) shows OD: focal NVE IN arcades; OS: no NV OS; late leaking MA's OU -- s/p PRP OD 6.10.22 - OCT shows OD: Interval improvement in IRF/IRHM and foveal profile greatest nasal mac;  OS: interval improvement in IRF/IRHM temporal macula - recommend IVA OU #7 today, 11.28.22 - pt wishes to proceed - RBA of procedure discussed, questions answered - informed consent obtained and signed - see procedure note - IVA informed consent obtained and signed (OU), 06.06.22 - f/u 4 weeks for DFE/OCT, possible injection(s)  4,5. Hypertensive retinopathy OU - discussed importance of tight BP control - monitor  6. Mixed Cataract OU - The symptoms of cataract, surgical options, and treatments and risks were discussed with patient. - discussed diagnosis and progression - not yet visually  significant - monitor for now  Ophthalmic Meds Ordered this visit:  No orders of the defined types were placed in this encounter.      No follow-ups on file.  There are no Patient Instructions on file for this visit.  This document serves as a record of services personally performed by Gardiner Sleeper, MD, PhD. It was created on their behalf by Roselee Nova, COMT. The creation of this record is the provider's dictation and/or activities during the visit.  Electronically signed by: Roselee Nova, COMT 07/24/21 8:22 AM     Gardiner Sleeper, M.D., Ph.D. Diseases & Surgery of the Retina and Vitreous [facility] 06/28/2021    Abbreviations: M myopia (nearsighted); A astigmatism; H hyperopia (farsighted); P presbyopia; Mrx spectacle prescription;  CTL contact lenses; OD right eye; OS left eye; OU both eyes  XT exotropia; ET esotropia; PEK punctate epithelial keratitis; PEE punctate epithelial erosions; DES dry eye syndrome; MGD meibomian gland dysfunction; ATs artificial tears; PFAT's preservative free artificial tears; [place] nuclear sclerotic cataract; PSC posterior subcapsular cataract; ERM epi-retinal membrane; PVD posterior vitreous detachment; RD retinal detachment; DM diabetes mellitus; DR diabetic retinopathy; NPDR non-proliferative diabetic retinopathy; PDR  proliferative diabetic retinopathy; CSME clinically significant macular edema; DME diabetic macular edema; dbh dot blot hemorrhages; CWS cotton wool spot; POAG primary open angle glaucoma; C/D cup-to-disc ratio; HVF humphrey visual field; GVF goldmann visual field; OCT optical coherence tomography; IOP intraocular pressure; BRVO Branch retinal vein occlusion; CRVO central retinal vein occlusion; CRAO central retinal artery occlusion; BRAO branch retinal artery occlusion; RT retinal tear; SB scleral buckle; PPV pars plana vitrectomy; VH Vitreous hemorrhage; PRP panretinal laser photocoagulation; IVK intravitreal kenalog; VMT vitreomacular traction; MH Macular hole;  NVD neovascularization of the disc; NVE neovascularization elsewhere; AREDS age related eye disease study; ARMD age related macular degeneration; POAG primary open angle glaucoma; EBMD epithelial/anterior basement membrane dystrophy; ACIOL anterior chamber intraocular lens; IOL intraocular lens; PCIOL posterior chamber intraocular lens; Phaco/IOL phacoemulsification with intraocular lens placement; [place] photorefractive keratectomy; LASIK laser assisted in situ keratomileusis; HTN hypertension; DM diabetes mellitus; COPD chronic obstructive pulmonary disease

## 2021-07-29 ENCOUNTER — Encounter (INDEPENDENT_AMBULATORY_CARE_PROVIDER_SITE_OTHER): Payer: Medicare Other | Admitting: Ophthalmology

## 2021-07-29 DIAGNOSIS — H25813 Combined forms of age-related cataract, bilateral: Secondary | ICD-10-CM

## 2021-07-29 DIAGNOSIS — E113412 Type 2 diabetes mellitus with severe nonproliferative diabetic retinopathy with macular edema, left eye: Secondary | ICD-10-CM

## 2021-07-29 DIAGNOSIS — I10 Essential (primary) hypertension: Secondary | ICD-10-CM

## 2021-07-29 DIAGNOSIS — H35033 Hypertensive retinopathy, bilateral: Secondary | ICD-10-CM

## 2021-07-29 DIAGNOSIS — H3581 Retinal edema: Secondary | ICD-10-CM

## 2021-07-29 DIAGNOSIS — E113511 Type 2 diabetes mellitus with proliferative diabetic retinopathy with macular edema, right eye: Secondary | ICD-10-CM

## 2021-08-05 ENCOUNTER — Ambulatory Visit (INDEPENDENT_AMBULATORY_CARE_PROVIDER_SITE_OTHER): Payer: Medicare Other | Admitting: Cardiovascular Disease

## 2021-08-05 ENCOUNTER — Other Ambulatory Visit: Payer: Self-pay

## 2021-08-05 ENCOUNTER — Encounter (HOSPITAL_BASED_OUTPATIENT_CLINIC_OR_DEPARTMENT_OTHER): Payer: Self-pay | Admitting: Cardiovascular Disease

## 2021-08-05 DIAGNOSIS — I5043 Acute on chronic combined systolic (congestive) and diastolic (congestive) heart failure: Secondary | ICD-10-CM | POA: Diagnosis not present

## 2021-08-05 DIAGNOSIS — I10 Essential (primary) hypertension: Secondary | ICD-10-CM

## 2021-08-05 DIAGNOSIS — I2510 Atherosclerotic heart disease of native coronary artery without angina pectoris: Secondary | ICD-10-CM | POA: Diagnosis not present

## 2021-08-05 DIAGNOSIS — E7800 Pure hypercholesterolemia, unspecified: Secondary | ICD-10-CM | POA: Diagnosis not present

## 2021-08-05 HISTORY — DX: Pure hypercholesterolemia, unspecified: E78.00

## 2021-08-05 MED ORDER — HYDRALAZINE HCL 25 MG PO TABS: 25.0000 mg | ORAL_TABLET | Freq: Two times a day (BID) | ORAL | 3 refills | Status: DC

## 2021-08-05 NOTE — Assessment & Plan Note (Signed)
Lipids are well-controlled on rosuvastatin.  Continue current regimen.

## 2021-08-05 NOTE — Assessment & Plan Note (Signed)
He is status post LAD PCI.  He has no anginal symptoms and is getting a lot of exercise.  Continue to stay active.  Continue aspirin, rosuvastatin, and carvedilol.

## 2021-08-05 NOTE — Patient Instructions (Signed)
Medication Instructions:  START- Hydralazine 25 mg by mouth twice a day  *If you need a refill on your cardiac medications before your next appointment, please call your pharmacy*   Lab Work: None Ordered   Testing/Procedures: None Ordered   Follow-Up: At [facility], you and your health needs are our priority.  As part of our continuing mission to provide you with exceptional heart care, we have created designated Provider Care Teams.  These Care Teams include your primary Cardiologist (physician) and Advanced Practice Providers (APPs -  Physician Assistants and Nurse Practitioners) who all work together to provide you with the care you need, when you need it.  We recommend signing up for the patient portal called "MyChart".  Sign up information is provided on this After Visit Summary.  MyChart is used to connect with patients for Virtual Visits (Telemedicine).  Patients are able to view lab/test results, encounter notes, upcoming appointments, etc.  Non-urgent messages can be sent to your provider as well.   To learn more about what you can do with MyChart, go to [email or web address].    Your next appointment:   6 month(s)  The format for your next appointment:   In Person  Provider:   [name], MD    Other Instructions Your physician recommends that you schedule a follow-up appointment in: 2 Months with Laurann Montana, NP

## 2021-08-05 NOTE — Progress Notes (Signed)
Cardiology Office Note   Date:  08/05/2021   ID:  Earl Calderon, DOB Oct 31, 1950, MRN [id number]  PCP:  [name], MD  Cardiologist:   [name], MD   No chief complaint on file.    History of Present Illness: Earl Calderon is a 70 yo male with chronic systolic and diastolic heart failure, prior stroke, diabetes, hypertension, hyperlipidemia, CAD status post PCI and nonadherence admitted 07/2020 with acute on chronic systolic and diastolic heart failure in the setting of not taking his medications. He was admitted with volume overload and dyspnea.  He wanted to "remove all chemicals from his body" and decided to stop his medications. Echo revealed LVEF 30-35% with global hypokinesis and moderate LVH. BP was poorly controlled. There was concern for cardiac amyloidosis.  PYP scan during that hospitalization was not suggestive of ATTR amyloidosis.  He was started on carvedilol, Entresto, spironolactone, BiDil, and for [place].  Prior to discharge he had a right heart cath with radial pressure of 8, RV 53/3, pulmonary capillary wedge 15.  Mean PA pressure was 29 mmHg.  After discharge he was unable to get spironolactone or BiDil from the pharmacy.  Earl Calderon was first seen by cardiology 10/2018 at which time he was hospitalized and found to have a newly reduced LVEF to 25 to 30%.  EKG revealed left bundle branch block.  He underwent right and left heart cath 10/2018 and was found to have a 90% mid LAD lesion that was successfully stented with drug-eluting stent.  He did not follow-up after that time.  He was seen 07/2020 and he was not taking spirolactone or vidl, however they were not started because his blood pressure was low and he was fatigued. His Lasix was decreased given he was on farxiga. He followed up with [name] and Imdur was added. He then felt fatigued and weak and was seen on 10/2020 for chest pain.  At his initial appointment he was doing well from a cardiac  standpoint. He reported some DOE that did not concern him. He was working with [name] for diabetic retinopathy. At his last appointment, he was fatigued and mildly short of breath consistently and his eyesight was mostly unchanged. He reported slight improvement with the injection treatments. His blood pressure was low and he was not taking carvedilol, the dose was reduced to 12.5mg . He saw Dr. Marin Olp on 06/2021 and his blood pressure was very elevated, though he had not taken medication that morning. He was referred for a R supraclavicular mass. He is being worked up for a monoclonal spike. He had a CT scan that showed the mass was a lipoma.   Today, he is not doing well. He has been feeling fatigued everyday. At home, he reports blood pressure readings in the 02H diastolic. He has not taken his medicine this morning. Routinely, he takes his medicine at 12PM and right before bed. He is unable to sleep well and typically goes to bed around 11PM-12AM. Fear about being unhealthy keeps him up at night. He is able to speak with his wife and she is a good source of support for him. He performs work around [employer] and yard and feels good. However, he tires quickly and has to take frequent breaks. He watches his salt intake. However, he enjoys eating pork. He denies any palpitations, chest pain, or shortness of breath, lightheadedness, headaches, syncope, orthopnea, PND, or lower extremity edema.  Past Medical History:  Diagnosis Date  CAD in native artery 01/01/2021   Prior LAD PCI.  Currently no symptoms of ischemia.  Encouraged him to do some light exercising.  Continue aspirin, carvedilol, and rosuvastatin.   Cataract    CKD (chronic kidney disease), stage III ([place]) 08/24/2020   Coronary artery disease    Diabetes mellitus without complication ([place])    on meds   Diabetic retinopathy ([place])    Goals of care, counseling/discussion 07/01/2021   Hernia, inguinal    Hypertension    on meds    Hypertensive retinopathy    Lymphadenopathy, cervical 07/01/2021   MGUS (monoclonal gammopathy of unknown significance) 07/01/2021   NSVT (nonsustained ventricular tachycardia) 08/24/2020   Pure hypercholesterolemia 08/05/2021   Uncontrolled type 2 diabetes mellitus 11/11/2018    Past Surgical History:  Procedure Laterality Date   CORONARY STENT INTERVENTION N/A 11/15/2018   Procedure: CORONARY STENT INTERVENTION;  Surgeon: [name], MD;  Location: [facility];  Service: Cardiovascular;  Laterality: N/A;   HEMORROIDECTOMY     RIGHT HEART CATH N/A 07/18/2020   Procedure: RIGHT HEART CATH;  Surgeon: [name], MD;  Location: [facility];  Service: Cardiovascular;  Laterality: N/A;   RIGHT/LEFT HEART CATH AND CORONARY ANGIOGRAPHY N/A 11/15/2018   Procedure: RIGHT/LEFT HEART CATH AND CORONARY ANGIOGRAPHY;  Surgeon: [name], MD;  Location: [facility];  Service: Cardiovascular;  Laterality: N/A;   WISDOM TOOTH EXTRACTION       Current Outpatient Medications  Medication Sig Dispense Refill   APPLE CIDER VINEGAR PO Take 1 tablet by mouth daily at 6 (six) AM.     aspirin EC 81 MG tablet Take 1 tablet (81 mg total) by mouth daily. Swallow whole. 90 tablet 3   carvedilol (COREG) 12.5 MG tablet Take 1 tablet (12.5 mg total) by mouth 2 (two) times daily with a meal. 180 tablet 3   ELDERBERRY PO Take 100 mg by mouth daily.     empagliflozin (JARDIANCE) 10 MG TABS tablet Take 1 tablet (10 mg total) by mouth daily before breakfast. 90 tablet 3   ferrous sulfate 325 (65 FE) MG tablet Take 1 tablet (325 mg total) by mouth daily with breakfast. 30 tablet 3   furosemide (LASIX) 40 MG tablet Take 1 tablet (40 mg total) by mouth daily. 90 tablet 3   glucose blood (FREESTYLE TEST STRIPS) test strip Use as instructed 100 each 12   hydrALAZINE (APRESOLINE) 25 MG tablet Take 1 tablet (25 mg total) by mouth in the morning and at bedtime. 180 tablet 3    insulin aspart (NOVOLOG FLEXPEN) 100 UNIT/ML FlexPen Before each meal 3 times a day, 140-199 - 2 units, 200-250 - 4 units, 251-299 - 6 units,  300-349 - 8 units,  350 or above 10 units. Insulin PEN if approved, provide syringes and needles if needed. 15 mL 2   Insulin Pen Needle (NOVOFINE PEN NEEDLE) 32G X 6 MM MISC USE AS DIRECTED 100 each 6   LEVEMIR FLEXTOUCH 100 UNIT/ML FlexPen Inject 8 Units into the skin 2 (two) times daily. 15 mL 1   nitroGLYCERIN (NITROSTAT) 0.4 MG SL tablet Place 1 tablet (0.4 mg total) under the tongue every 5 (five) minutes as needed for chest pain. 25 tablet 3   Probiotic Product (PROBIOTIC MULTI-ENZYME) TABS Take 3 tablets by mouth daily at 6 (six) AM.     rosuvastatin (CRESTOR) 40 MG tablet Take 1 tablet (40 mg total) by mouth daily at 6 PM. 90  tablet 3   sacubitril-valsartan (ENTRESTO) 97-103 MG Take 1 tablet by mouth 2 (two) times daily. 180 tablet 3   spironolactone (ALDACTONE) 25 MG tablet Take 1 tablet (25 mg total) by mouth daily. 90 tablet 3   Turmeric 500 MG TABS Take 2 tablets by mouth daily at 6 (six) AM.     No current facility-administered medications for this visit.    Allergies:   Patient has no known allergies.    Social History:  The patient  reports that he has never smoked. He has never used smokeless tobacco. He reports that he does not currently use alcohol. He reports that he does not use drugs.   Family History:  The patient's family history includes Arrhythmia in his mother; Diabetes Mellitus II in his mother and another family member; Heart disease in his brother and mother; Hypertension in his brother, mother, sister, and another family member; Kidney disease in his brother.    ROS:   Please see the history of present illness. (+) Fatigue (+) Insomnia All other systems are reviewed and negative.    PHYSICAL EXAM: VS:  BP (!) 146/88 (BP Location: Right Arm, Patient Position: Sitting, Cuff Size: Small)   Pulse 66   Ht 5\' 10"  (1.778  m)   Wt 145 lb 6.4 oz (66 kg)   SpO2 98%   BMI 20.86 kg/m  , BMI Body mass index is 20.86 kg/m. GENERAL:  Well appearing.  No acute distress. HEENT:  Pupils equal round and reactive, fundi not visualized, oral mucosa unremarkable NECK:  No jugular venous distention, waveform within normal limits, carotid upstroke brisk and symmetric, no bruits LUNGS:  Clear to auscultation bilaterally HEART:  RRR.  PMI not displaced or sustained,S1 and S2 within normal limits, no S3, no S4, no clicks, no rubs, no murmurs ABD:  Flat, positive bowel sounds normal in frequency in pitch, no bruits, no rebound, no guarding, no midline pulsatile mass, no hepatomegaly, no splenomegaly EXT:  2 plus pulses throughout, no edema, no cyanosis no clubbing SKIN:  No rashes no nodules NEURO:  Cranial nerves II through XII grossly intact, motor grossly intact throughout PSYCH:  Cognitively intact, oriented to person place and time  EKG:  EKG was not ordered today 05/03/2021: Sinus bradycardia. Rate 59 bpm. LBBB.  Chest CT 07/19/21 1. No evidence for lymphadenopathy in the chest, abdomen, or pelvis. 2. 1.9 cm lesion in the lateral segment left liver shows peripheral enhancement and filling in delayed imaging. Imaging characteristics suggest cavernous hemangioma but are not entirely characteristic. MRI abdomen without and with contrast recommended to further evaluate. 3. 12 mm hypervascular lesion in the spleen cannot be definitively characterized but is likely benign and may be a hemangioma. Attention on follow-up recommended. 4. 2 mm right middle lobe and 4 mm perifissural right lower lobe pulmonary nodules. No follow-up needed if patient is low-risk (and has no known or suspected primary neoplasm). Non-contrast chest CT can be considered in 12 months if patient is high-risk. This recommendation follows the consensus statement: Guidelines for Management of Incidental Pulmonary Nodules Detected on CT Images: From  the Fleischner Society 2017; Radiology 2017; 284:228-243. 5. Small left groin hernia contains only fat. 6. Aortic Atherosclerosis (ICD10-I70.0).  Echo 05/27/21  1. Left ventricular ejection fraction, by estimation, is 40 to 45%. Left  ventricular ejection fraction by 3D volume is 44 %. The left ventricle has  mildly decreased function. The left ventricle demonstrates global  hypokinesis. There is moderate concentric left ventricular hypertrophy.  Left ventricular diastolic parameters are consistent with Grade I diastolic dysfunction (impaired relaxation). Elevated left ventricular end-diastolic pressure. The average left ventricular global longitudinal strain is -15.2 %. The global longitudinal strain is abnormal.   2. Right ventricular systolic function is normal. The right ventricular  size is normal. There is normal pulmonary artery systolic pressure.   3. Left atrial size was mildly dilated.   4. A small pericardial effusion is present. The pericardial effusion is  anterior to the right ventricle.   5. The mitral valve is normal in structure. Trivial mitral valve  regurgitation. No evidence of mitral stenosis.   6. The aortic valve is normal in structure. Aortic valve regurgitation is  not visualized. No aortic stenosis is present.   7. Aortic dilatation noted. There is mild dilatation of the aortic root,  measuring 41 mm. There is mild dilatation of the ascending aorta,  measuring 38 mm.   8. The inferior vena cava is normal in size with greater than 50%  respiratory variability, suggesting right atrial pressure of 3 mmHg.  Comparison(s): 02/20/21 EF 40-45%. PA pressure 80mmHg.   US Thyroid 04/10/2021: IMPRESSION: 1. Indeterminate right supraclavicular soft tissue mass. No evidence of cervical lymphadenopathy. The mass would be amenable to percutaneous biopsy as clinically indicated. 2. Normal appearance of the thyroid gland.  Echo 02/20/2021: 1. Left ventricular ejection fraction,  by estimation, is 40 to 45%. The  left ventricle has mildly decreased function. The left ventricle  demonstrates global hypokinesis. There is severe left ventricular  hypertrophy. Left ventricular diastolic parameters   are consistent with Grade II diastolic dysfunction (pseudonormalization).  Elevated left atrial pressure.   2. Right ventricular systolic function is mildly reduced. The right  ventricular size is normal. There is normal pulmonary artery systolic  pressure. The estimated right ventricular systolic pressure is 10.2 mmHg.   3. Left atrial size was moderately dilated.   4. The mitral valve is normal in structure. No evidence of mitral valve  regurgitation. No evidence of mitral stenosis.   5. The aortic valve is tricuspid. Aortic valve regurgitation is not  visualized. Mild aortic valve sclerosis is present, with no evidence of  aortic valve stenosis.   6. The inferior vena cava is normal in size with <50% respiratory  variability, suggesting right atrial pressure of 8 mmHg.   Comparison(s): Compared to prior echo, LV systolic function has improved.   [place] 07/18/20: Mildly elevated filling pressures, mild to moderate pulmonary hypertension and mildly reduced cardiac output.   RA: 8 mmHg RV: 53/3 mmHg PCW 15 mmHg PA: 50/17 mmHg with a mean of 29   AO sat 97% PA sat 60% Cardiac output: 3.92 with a cardiac index of 2.15.  Echo 07/10/20:  1. Left ventricular ejection fraction, by estimation, is 30 to 35%. The  left ventricle has moderately decreased function. The left ventricle  demonstrates global hypokinesis. There is moderate concentric left  ventricular hypertrophy. Left ventricular  diastolic parameters are consistent with Grade II diastolic dysfunction  (pseudonormalization). Elevated left atrial pressure.   2. Right ventricular systolic function is moderately reduced. The right  ventricular size is mildly enlarged. Mildly increased right ventricular  wall  thickness. There is moderately elevated pulmonary artery systolic  pressure. The estimated right  ventricular systolic pressure is 58.5 mmHg.   3. Left atrial size was severely dilated.   4. Right atrial size was moderately dilated.   5. The mitral valve is normal in structure. Mild mitral valve  regurgitation.  6. Tricuspid valve regurgitation is moderate.   7. The aortic valve is tricuspid. Aortic valve regurgitation is not  visualized. No aortic stenosis is present.   8. The inferior vena cava is dilated in size with <50% respiratory  variability, suggesting right atrial pressure of 15 mmHg.   LHC 11/15/18:  Prox LAD to Mid LAD lesion is 40% stenosed. Mid Cx to Dist Cx lesion is 25% stenosed. Ost 2nd Diag to 2nd Diag lesion is 50% stenosed. Ost 3rd Mrg lesion is 25% stenosed. Mid LAD lesion is 90% stenosed. A drug-eluting stent was successfully placed using a STENT SYNERGY DES 2.5X16. Post intervention, there is a 0% residual stenosis. LV end diastolic pressure is normal. There is no aortic valve stenosis. Ao 92%, PA 66%, mean PA 17 mm Hg, mean PCWP 3 mm Hg, CO 5.1; CI 2.8- normal right heart pressures Tortuous right subclavian. 3DRC was used for RCA.   Continue aggressive secondary prevention in addition to dual antiplatelet therapy.  Adequate diuresis based on right heart pressures.  Continue medical therapy for heart failure.    Recent Labs: 09/26/2020: TSH 1.540 07/01/2021: ALT 19; BUN 24; Creatinine 1.15; Hemoglobin 11.4; Platelet Count 146; Potassium 3.8; Sodium 138    Lipid Panel    Component Value Date/Time   CHOL 103 01/16/2021 1705   TRIG 66 01/16/2021 1705   HDL 47 01/16/2021 1705   CHOLHDL 2.2 01/16/2021 1705   CHOLHDL 3.6 07/12/2020 0510   VLDL 13 07/12/2020 0510   LDLCALC 42 01/16/2021 1705      Wt Readings from Last 3 Encounters:  08/05/21 145 lb 6.4 oz (66 kg)  07/01/21 143 lb (64.9 kg)  05/03/21 143 lb 14.4 oz (65.3 kg)      ASSESSMENT AND  PLAN: Acute on chronic combined systolic (congestive) and diastolic (congestive) heart failure (HCC) He is euvolemic on exam and doing well.  LVEF 40 to 45%.  Blood pressures not controlled.  We will add hydralazine 25 mg twice daily.  Continue carvedilol, Entresto, Lasix, Jardiance, and spironolactone.  CAD in native artery He is status post LAD PCI.  He has no anginal symptoms and is getting a lot of exercise.  Continue to stay active.  Continue aspirin, rosuvastatin, and carvedilol.  Pure hypercholesterolemia Lipids are well-controlled on rosuvastatin.  Continue current regimen.  Hypertension Blood pressure is poorly controlled.  Continue Entresto, carvedilol, spironolactone, and Lasix.  We will add hydralazine 25 mg twice daily.  He will keep tracking his blood pressures at home and work on limiting the sodium in his diet.   Current medicines are reviewed at length with the patient today.  The patient does not have concerns regarding medicines.  The following changes have been made: Switch Farxiga to [place]  Labs/ tests ordered today include:   No orders of the defined types were placed in this encounter.    Disposition:   FU with APP in 2-3 months FU with Brenyn Petrey C. Oval Linsey, MD, [facility] in 6 months.  I,Mykaella Javier,acting as a scribe for [name], MD.,have documented all relevant documentation on the behalf of [name], MD,as directed by  [name], MD while in the presence of [name], MD.  I, [place] Oval Linsey, MD have reviewed all documentation for this visit.  The documentation of the exam, diagnosis, procedures, and orders on 08/05/2021 are all accurate and complete.   Signed, Malayah Demuro C. Oval Linsey, MD, [facility]  08/05/2021 12:36 PM    [place]

## 2021-08-05 NOTE — Assessment & Plan Note (Signed)
He is euvolemic on exam and doing well.  LVEF 40 to 45%.  Blood pressures not controlled.  We will add hydralazine 25 mg twice daily.  Continue carvedilol, Entresto, Lasix, Jardiance, and spironolactone.

## 2021-08-05 NOTE — Assessment & Plan Note (Signed)
Blood pressure is poorly controlled.  Continue Entresto, carvedilol, spironolactone, and Lasix.  We will add hydralazine 25 mg twice daily.  He will keep tracking his blood pressures at home and work on limiting the sodium in his diet.

## 2021-08-08 NOTE — Telephone Encounter (Signed)
Patient was seen by Dr Oval Linsey 08/05/2021

## 2021-08-08 NOTE — Progress Notes (Shared)
[facility] Note  08/12/2021     CHIEF COMPLAINT Patient presents for No chief complaint on file.   HISTORY OF PRESENT ILLNESS: Earl Calderon is a 70 y.o. male who presents to the clinic today for:     Pt states he cant tell any improvement in vision  Referring physician: [name], MD [address]  [address] [place],  [place] [address]  HISTORICAL INFORMATION:   Selected notes from the MEDICAL RECORD NUMBER Referred by Dr. [name] for severe NPDR OU   CURRENT MEDICATIONS: No current outpatient medications on file. (Ophthalmic Drugs)   No current facility-administered medications for this visit. (Ophthalmic Drugs)   Current Outpatient Medications (Other)  Medication Sig   APPLE CIDER VINEGAR PO Take 1 tablet by mouth daily at 6 (six) AM.   aspirin EC 81 MG tablet Take 1 tablet (81 mg total) by mouth daily. Swallow whole.   carvedilol (COREG) 12.5 MG tablet Take 1 tablet (12.5 mg total) by mouth 2 (two) times daily with a meal.   ELDERBERRY PO Take 100 mg by mouth daily.   empagliflozin (JARDIANCE) 10 MG TABS tablet Take 1 tablet (10 mg total) by mouth daily before breakfast.   ferrous sulfate 325 (65 FE) MG tablet Take 1 tablet (325 mg total) by mouth daily with breakfast.   furosemide (LASIX) 40 MG tablet Take 1 tablet (40 mg total) by mouth daily.   glucose blood (FREESTYLE TEST STRIPS) test strip Use as instructed   hydrALAZINE (APRESOLINE) 25 MG tablet Take 1 tablet (25 mg total) by mouth in the morning and at bedtime.   insulin aspart (NOVOLOG FLEXPEN) 100 UNIT/ML FlexPen Before each meal 3 times a day, 140-199 - 2 units, 200-250 - 4 units, 251-299 - 6 units,  300-349 - 8 units,  350 or above 10 units. Insulin PEN if approved, provide syringes and needles if needed.   Insulin Pen Needle (NOVOFINE PEN NEEDLE) 32G X 6 MM MISC USE AS DIRECTED   LEVEMIR FLEXTOUCH 100 UNIT/ML FlexPen Inject 8 Units into the skin 2 (two)  times daily.   nitroGLYCERIN (NITROSTAT) 0.4 MG SL tablet Place 1 tablet (0.4 mg total) under the tongue every 5 (five) minutes as needed for chest pain.   Probiotic Product (PROBIOTIC MULTI-ENZYME) TABS Take 3 tablets by mouth daily at 6 (six) AM.   rosuvastatin (CRESTOR) 40 MG tablet Take 1 tablet (40 mg total) by mouth daily at 6 PM.   sacubitril-valsartan (ENTRESTO) 97-103 MG Take 1 tablet by mouth 2 (two) times daily.   spironolactone (ALDACTONE) 25 MG tablet Take 1 tablet (25 mg total) by mouth daily.   Turmeric 500 MG TABS Take 2 tablets by mouth daily at 6 (six) AM.   No current facility-administered medications for this visit. (Other)   REVIEW OF SYSTEMS:   ALLERGIES No Known Allergies  PAST MEDICAL HISTORY Past Medical History:  Diagnosis Date   CAD in native artery 01/01/2021   Prior LAD PCI.  Currently no symptoms of ischemia.  Encouraged him to do some light exercising.  Continue aspirin, carvedilol, and rosuvastatin.   Cataract    CKD (chronic kidney disease), stage III ([place]) 08/24/2020   Coronary artery disease    Diabetes mellitus without complication ([place])    on meds   Diabetic retinopathy ([place])    Goals of care, counseling/discussion 07/01/2021   Hernia, inguinal    Hypertension    on meds   Hypertensive retinopathy  Lymphadenopathy, cervical 07/01/2021   MGUS (monoclonal gammopathy of unknown significance) 07/01/2021   NSVT (nonsustained ventricular tachycardia) 08/24/2020   Pure hypercholesterolemia 08/05/2021   Uncontrolled type 2 diabetes mellitus 11/11/2018   Past Surgical History:  Procedure Laterality Date   CORONARY STENT INTERVENTION N/A 11/15/2018   Procedure: CORONARY STENT INTERVENTION;  Surgeon: [name], MD;  Location: [facility];  Service: Cardiovascular;  Laterality: N/A;   HEMORROIDECTOMY     RIGHT HEART CATH N/A 07/18/2020   Procedure: RIGHT HEART CATH;  Surgeon: [name], MD;  Location: [facility];   Service: Cardiovascular;  Laterality: N/A;   RIGHT/LEFT HEART CATH AND CORONARY ANGIOGRAPHY N/A 11/15/2018   Procedure: RIGHT/LEFT HEART CATH AND CORONARY ANGIOGRAPHY;  Surgeon: [name], MD;  Location: [facility];  Service: Cardiovascular;  Laterality: N/A;   WISDOM TOOTH EXTRACTION      FAMILY HISTORY Family History  Problem Relation Age of Onset   Hypertension Mother    Diabetes Mellitus II Mother    Arrhythmia Mother    Heart disease Mother    Hypertension Sister    Hypertension Brother    Heart disease Brother    Kidney disease Brother    Hypertension Other    Diabetes Mellitus II Other        All 9 sibblings have HTN   Colon cancer Neg Hx    Colon polyps Neg Hx    Esophageal cancer Neg Hx    Stomach cancer Neg Hx    Rectal cancer Neg Hx    SOCIAL HISTORY Social History   Tobacco Use   Smoking status: Never   Smokeless tobacco: Never  Vaping Use   Vaping Use: Never used  Substance Use Topics   Alcohol use: Not Currently   Drug use: Never       OPHTHALMIC EXAM:  Not recorded    IMAGING AND PROCEDURES  Imaging and Procedures for 08/12/2021           ASSESSMENT/PLAN:  No diagnosis found.  1-3. Proliferative diabetic retinopathy, OD  - severe NPDR OS  - s/p IVA OU #1 (06.06.22), #2 (07.08.22), #3 (08.05.22), #4 (09.02.22), #5 (09.30.22), #6 (10.28.22)  - s/p PRP OD (06.10.22) - exam shows central edema OU, scattered IRH -- improving - FA (06.06.22) shows OD: focal NVE IN arcades; OS: no NV OS; late leaking MA's OU -- s/p PRP OD 6.10.22 - OCT shows OD: Interval improvement in IRF/IRHM and foveal profile greatest nasal mac; OS: interval improvement in IRF/IRHM temporal macula - recommend IVA OU #7 today, 12.12.22 - pt wishes to proceed - RBA of procedure discussed, questions answered - informed consent obtained and signed - see procedure note - IVA informed consent obtained and signed (OU), 06.06.22 - f/u 4 weeks for DFE/OCT,  possible injection(s)  4,5. Hypertensive retinopathy OU - discussed importance of tight BP control - monitor  6. Mixed Cataract OU - The symptoms of cataract, surgical options, and treatments and risks were discussed with patient. - discussed diagnosis and progression - not yet visually significant - monitor for now  Ophthalmic Meds Ordered this visit:  No orders of the defined types were placed in this encounter.      No follow-ups on file.  There are no Patient Instructions on file for this visit.  This document serves as a record of services personally performed by Gardiner Sleeper, MD, PhD. It was created on their behalf by Roselee Nova, COMT. The creation  of this record is the provider's dictation and/or activities during the visit.  Electronically signed by: Roselee Nova, COMT 08/08/21 10:53 AM     Gardiner Sleeper, M.D., Ph.D. Diseases & Surgery of the Retina and [place] 06/28/2021    Abbreviations: M myopia (nearsighted); A astigmatism; H hyperopia (farsighted); P presbyopia; Mrx spectacle prescription;  CTL contact lenses; OD right eye; OS left eye; OU both eyes  XT exotropia; ET esotropia; PEK punctate epithelial keratitis; PEE punctate epithelial erosions; DES dry eye syndrome; MGD meibomian gland dysfunction; ATs artificial tears; PFAT's preservative free artificial tears; [place] nuclear sclerotic cataract; PSC posterior subcapsular cataract; ERM epi-retinal membrane; PVD posterior vitreous detachment; RD retinal detachment; DM diabetes mellitus; DR diabetic retinopathy; NPDR non-proliferative diabetic retinopathy; PDR proliferative diabetic retinopathy; CSME clinically significant macular edema; DME diabetic macular edema; dbh dot blot hemorrhages; CWS cotton wool spot; POAG primary open angle glaucoma; C/D cup-to-disc ratio; HVF humphrey visual field; GVF goldmann visual field; OCT optical coherence tomography; IOP intraocular  pressure; BRVO Branch retinal vein occlusion; CRVO central retinal vein occlusion; CRAO central retinal artery occlusion; BRAO branch retinal artery occlusion; RT retinal tear; SB scleral buckle; PPV pars plana vitrectomy; VH Vitreous hemorrhage; PRP panretinal laser photocoagulation; IVK intravitreal kenalog; VMT vitreomacular traction; MH Macular hole;  NVD neovascularization of the disc; NVE neovascularization elsewhere; AREDS age related eye disease study; ARMD age related macular degeneration; POAG primary open angle glaucoma; EBMD epithelial/anterior basement membrane dystrophy; ACIOL anterior chamber intraocular lens; IOL intraocular lens; PCIOL posterior chamber intraocular lens; Phaco/IOL phacoemulsification with intraocular lens placement; [place] photorefractive keratectomy; LASIK laser assisted in situ keratomileusis; HTN hypertension; DM diabetes mellitus; COPD chronic obstructive pulmonary disease

## 2021-08-12 ENCOUNTER — Encounter (INDEPENDENT_AMBULATORY_CARE_PROVIDER_SITE_OTHER): Payer: Medicare Other | Admitting: Ophthalmology

## 2021-08-12 DIAGNOSIS — H3581 Retinal edema: Secondary | ICD-10-CM

## 2021-08-12 DIAGNOSIS — H25813 Combined forms of age-related cataract, bilateral: Secondary | ICD-10-CM

## 2021-08-12 DIAGNOSIS — E113412 Type 2 diabetes mellitus with severe nonproliferative diabetic retinopathy with macular edema, left eye: Secondary | ICD-10-CM

## 2021-08-12 DIAGNOSIS — H35033 Hypertensive retinopathy, bilateral: Secondary | ICD-10-CM

## 2021-08-12 DIAGNOSIS — E113511 Type 2 diabetes mellitus with proliferative diabetic retinopathy with macular edema, right eye: Secondary | ICD-10-CM

## 2021-08-12 DIAGNOSIS — I10 Essential (primary) hypertension: Secondary | ICD-10-CM

## 2021-09-02 ENCOUNTER — Telehealth: Payer: Self-pay

## 2021-09-02 NOTE — Telephone Encounter (Signed)
Call made to conduct transportation screening. Was unable to reach patient. LVM.

## 2021-09-03 NOTE — Progress Notes (Signed)
[facility] Note  09/04/2021     CHIEF COMPLAINT Patient presents for Retina Follow Up  HISTORY OF PRESENT ILLNESS: Earl Calderon is a 71 y.o. male who presents to the clinic today for:   HPI     Retina Follow Up   Patient presents with  Diabetic Retinopathy.  In both eyes.  Duration of 10 weeks.  Since onset it is stable.  I, the attending physician,  performed the HPI with the patient and updated documentation appropriately.        Comments   10 week follow up (should have been 4 weeks: he forgot about his appt) PDR OD, NPDR OS-  Vision is still blurry but no worse. Unsure last BS he forgets.   A1C unsure      Last edited by [name], MD on 09/04/2021  4:14 PM.    Pt is delayed to follow up from June 28, 2021, he states he was surprised his vision is so good today bc he thought it had deteriorated, he was put on an additional blood pressure medication at the beginning of December  Referring physician: [name], MD [address]  [address] [place],  [place] [address]  HISTORICAL INFORMATION:   Selected notes from the MEDICAL RECORD NUMBER Referred by Dr. [name] for severe NPDR OU   CURRENT MEDICATIONS: No current outpatient medications on file. (Ophthalmic Drugs)   No current facility-administered medications for this visit. (Ophthalmic Drugs)   Current Outpatient Medications (Other)  Medication Sig   APPLE CIDER VINEGAR PO Take 1 tablet by mouth daily at 6 (six) AM.   aspirin EC 81 MG tablet Take 1 tablet (81 mg total) by mouth daily. Swallow whole.   ELDERBERRY PO Take 100 mg by mouth daily.   empagliflozin (JARDIANCE) 10 MG TABS tablet Take 1 tablet (10 mg total) by mouth daily before breakfast.   ferrous sulfate 325 (65 FE) MG tablet Take 1 tablet (325 mg total) by mouth daily with breakfast.   furosemide (LASIX) 40 MG tablet Take 1 tablet (40 mg total) by mouth daily.   hydrALAZINE (APRESOLINE) 25 MG  tablet Take 1 tablet (25 mg total) by mouth in the morning and at bedtime.   insulin aspart (NOVOLOG FLEXPEN) 100 UNIT/ML FlexPen Before each meal 3 times a day, 140-199 - 2 units, 200-250 - 4 units, 251-299 - 6 units,  300-349 - 8 units,  350 or above 10 units. Insulin PEN if approved, provide syringes and needles if needed.   LEVEMIR FLEXTOUCH 100 UNIT/ML FlexPen Inject 8 Units into the skin 2 (two) times daily.   Probiotic Product (PROBIOTIC MULTI-ENZYME) TABS Take 3 tablets by mouth daily at 6 (six) AM.   sacubitril-valsartan (ENTRESTO) 97-103 MG Take 1 tablet by mouth 2 (two) times daily.   Turmeric 500 MG TABS Take 2 tablets by mouth daily at 6 (six) AM.   carvedilol (COREG) 12.5 MG tablet Take 1 tablet (12.5 mg total) by mouth 2 (two) times daily with a meal.   glucose blood (FREESTYLE TEST STRIPS) test strip Use as instructed   Insulin Pen Needle (NOVOFINE PEN NEEDLE) 32G X 6 MM MISC USE AS DIRECTED   nitroGLYCERIN (NITROSTAT) 0.4 MG SL tablet Place 1 tablet (0.4 mg total) under the tongue every 5 (five) minutes as needed for chest pain.   rosuvastatin (CRESTOR) 40 MG tablet Take 1 tablet (40 mg total) by mouth daily at 6 PM.   spironolactone (  ALDACTONE) 25 MG tablet Take 1 tablet (25 mg total) by mouth daily.   No current facility-administered medications for this visit. (Other)   REVIEW OF SYSTEMS: ROS   Positive for: Endocrine, Cardiovascular, Eyes Negative for: Constitutional, Gastrointestinal, Neurological, Skin, Genitourinary, Musculoskeletal, HENT, Respiratory, Psychiatric, Allergic/Imm, Heme/Lymph Last edited by Leonie Douglas, COA on 09/04/2021  1:34 PM.     ALLERGIES No Known Allergies  PAST MEDICAL HISTORY Past Medical History:  Diagnosis Date   CAD in native artery 01/01/2021   Prior LAD PCI.  Currently no symptoms of ischemia.  Encouraged him to do some light exercising.  Continue aspirin, carvedilol, and rosuvastatin.   Cataract    CKD (chronic kidney disease), stage  III ([place]) 08/24/2020   Coronary artery disease    Diabetes mellitus without complication ([place])    on meds   Diabetic retinopathy ([place])    Goals of care, counseling/discussion 07/01/2021   Hernia, inguinal    Hypertension    on meds   Hypertensive retinopathy    Lymphadenopathy, cervical 07/01/2021   MGUS (monoclonal gammopathy of unknown significance) 07/01/2021   NSVT (nonsustained ventricular tachycardia) 08/24/2020   Pure hypercholesterolemia 08/05/2021   Uncontrolled type 2 diabetes mellitus 11/11/2018   Past Surgical History:  Procedure Laterality Date   CORONARY STENT INTERVENTION N/A 11/15/2018   Procedure: CORONARY STENT INTERVENTION;  Surgeon: [name], MD;  Location: [facility];  Service: Cardiovascular;  Laterality: N/A;   HEMORROIDECTOMY     RIGHT HEART CATH N/A 07/18/2020   Procedure: RIGHT HEART CATH;  Surgeon: [name], MD;  Location: [facility];  Service: Cardiovascular;  Laterality: N/A;   RIGHT/LEFT HEART CATH AND CORONARY ANGIOGRAPHY N/A 11/15/2018   Procedure: RIGHT/LEFT HEART CATH AND CORONARY ANGIOGRAPHY;  Surgeon: [name], MD;  Location: [facility];  Service: Cardiovascular;  Laterality: N/A;   WISDOM TOOTH EXTRACTION      FAMILY HISTORY Family History  Problem Relation Age of Onset   Hypertension Mother    Diabetes Mellitus II Mother    Arrhythmia Mother    Heart disease Mother    Hypertension Sister    Hypertension Brother    Heart disease Brother    Kidney disease Brother    Hypertension Other    Diabetes Mellitus II Other        All 9 sibblings have HTN   Colon cancer Neg Hx    Colon polyps Neg Hx    Esophageal cancer Neg Hx    Stomach cancer Neg Hx    Rectal cancer Neg Hx    SOCIAL HISTORY Social History   Tobacco Use   Smoking status: Never   Smokeless tobacco: Never  Vaping Use   Vaping Use: Never used  Substance Use Topics   Alcohol use: Not Currently   Drug use: Never        OPHTHALMIC EXAM:  Base Eye Exam     Visual Acuity (Snellen - Linear)       Right Left   Dist [place] 20/40 20/80   Dist ph [place] 20/30 -2 20/70 -2         Tonometry (Tonopen, 1:42 PM)       Right Left   Pressure 15 13         Pupils       Dark Light Shape React APD   Right 4 3 Round Brisk None   Left 4 3 Round Brisk None  Visual Fields (Counting fingers)       Left Right   Restrictions Partial outer superior nasal deficiency Partial outer superior temporal deficiency         Extraocular Movement       Right Left    Full Full         Neuro/Psych     Oriented x3: Yes   Mood/Affect: Normal         Dilation     Both eyes: 1.0% Mydriacyl, 2.5% Phenylephrine @ 1:42 PM           Slit Lamp and Fundus Exam     Slit Lamp Exam       Right Left   Lids/Lashes Dermatochalasis - upper lid Dermatochalasis - upper lid   Conjunctiva/Sclera Melanosis Melanosis   Cornea arcus arcus   Anterior Chamber deep and clear deep and clear   Iris Round and poorly dilated, No NVI Round and dilated, No NVI   Lens 2-3+ Nuclear sclerosis, 2-3+ Cortical cataract 2-3+ Nuclear sclerosis, 2-3+ Cortical cataract   Anterior Vitreous Vitreous syneresis Vitreous syneresis         Fundus Exam       Right Left   Disc Pink and Sharp, no NVD Pink and Sharp, no NVD   C/D Ratio 0.6 0.4   Macula Blunted foveal reflex, central edema--slightly increased, scattered MA/exudate, +DBH - improved good foveal reflex, scattered MA/DBH, +edema -- improved, +exudates and CWS -- improved   Vessels attenuated, tortuous, focal NV IN arcades--regressing attenuated, Tortuous   Periphery Attached, 360 MA/DBH greatest posteriorly, light 360 PRP changes Attached, 360 DBH           IMAGING AND PROCEDURES  Imaging and Procedures for 09/04/2021  OCT, Retina - OU - Both Eyes       Right Eye Quality was good. Central Foveal Thickness: 325. Progression has worsened. Findings include  abnormal foveal contour, intraretinal fluid, intraretinal hyper-reflective material, vitreomacular adhesion , no SRF (Interval increase in IRF/IRHM, greatest nasal mac).   Left Eye Quality was good. Central Foveal Thickness: 213. Progression has improved. Findings include intraretinal fluid, no SRF, intraretinal hyper-reflective material, vitreomacular adhesion , normal foveal contour (Mild interval improvement in IRF/IRHM temporal macula).   Notes *Images captured and stored on drive  Diagnosis / Impression:  Abnormal foveal contour and +DME OU OD: Interval increase in IRF/IRHM, greatest nasal mac OS: mild interval improvement in IRF/IRHM temporal macula  Clinical management:  See below  Abbreviations: NFP - Normal foveal profile. CME - cystoid macular edema. PED - pigment epithelial detachment. IRF - intraretinal fluid. SRF - subretinal fluid. EZ - ellipsoid zone. ERM - epiretinal membrane. ORA - outer retinal atrophy. ORT - outer retinal tubulation. SRHM - subretinal hyper-reflective material. IRHM - intraretinal hyper-reflective material      Intravitreal Injection, Pharmacologic Agent - OD - Right Eye       Time Out 09/04/2021. 2:15 PM. Confirmed correct patient, procedure, site, and patient consented.   Anesthesia Topical anesthesia was used. Anesthetic medications included Lidocaine 2%, Proparacaine 0.5%.   Procedure Preparation included 5% betadine to ocular surface, eyelid speculum. A supplied needle was used.   Injection: 1.25 mg Bevacizumab 1.34m/0.05ml   Route: Intravitreal, Site: Right Eye   NDC:: 32919-166-06 Lot: [id number] , Expiration date: 10/08/2021, Waste: 0 mL   Post-op Post injection exam found visual acuity of at least counting fingers. The patient tolerated the procedure well. There were no complications. The patient received written and verbal post procedure care  education. Post injection medications were not given.      Intravitreal Injection,  Pharmacologic Agent - OS - Left Eye       Time Out 09/04/2021. 2:15 PM. Confirmed correct patient, procedure, site, and patient consented.   Anesthesia Topical anesthesia was used. Anesthetic medications included Lidocaine 2%, Proparacaine 0.5%.   Procedure Preparation included 5% betadine to ocular surface, eyelid speculum. A supplied needle was used.   Injection: 1.25 mg Bevacizumab 1.51m/0.05ml   Route: Intravitreal, Site: Left Eye   NDC: [id number] Lot: [id number] Expiration date: 10/05/2021, Waste: 0 mL   Post-op Post injection exam found visual acuity of at least counting fingers. The patient tolerated the procedure well. There were no complications. The patient received written and verbal post procedure care education. Post injection medications were not given.            ASSESSMENT/PLAN:    ICD-10-CM   1. Proliferative diabetic retinopathy of right eye with macular edema associated with type 2 diabetes mellitus (HCC)  E11.3511 OCT, Retina - OU - Both Eyes    Intravitreal Injection, Pharmacologic Agent - OD - Right Eye    Bevacizumab (AVASTIN) SOLN 1.25 mg    2. Severe nonproliferative diabetic retinopathy of left eye with macular edema associated with type 2 diabetes mellitus (HCC)  EK74.2595OCT, Retina - OU - Both Eyes    Intravitreal Injection, Pharmacologic Agent - OS - Left Eye    Bevacizumab (AVASTIN) SOLN 1.25 mg    3. Essential hypertension  I10     4. Hypertensive retinopathy of both eyes  H35.033     5. Combined forms of age-related cataract of both eyes  H25.813      1,2. Proliferative diabetic retinopathy, OD  - delayed to follow up from 10.28.22 to 01.04.23 (9+ wks)  - severe NPDR OS  - s/p IVA OU #1 (06.06.22), #2 (07.08.22), #3 (08.05.22), #4 (09.02.22), #5 (09.30.22), #6 (10.28.22)  - s/p PRP OD (06.10.22) - exam shows central edema OU, scattered IRH -- improving - FA (06.06.22) shows OD: focal NVE IN arcades; OS: no NV OS; late leaking MA's OU --  s/p PRP OD 6.10.22 - OCT shows OD: Interval increase in IRF/IRHM, greatest nasal mac; OS: mild interval improvement in IRF/IRHM temporal macula at 9+ wks - recommend IVA OU #7 today, 01.04.23 w/ f/u in 6 wks - pt wishes to proceed - RBA of procedure discussed, questions answered - informed consent obtained and signed - see procedure note - IVA informed consent obtained and signed (OU), 06.06.22 - f/u 6 weeks for DFE/OCT, possible injection(s)  3,4. Hypertensive retinopathy OU - discussed importance of tight BP control - monitor    5. Mixed Cataract OU - The symptoms of cataract, surgical options, and treatments and risks were discussed with patient. - discussed diagnosis and progression - not yet visually significant - monitor for now  Ophthalmic Meds Ordered this visit:  Meds ordered this encounter  Medications   Bevacizumab (AVASTIN) SOLN 1.25 mg   Bevacizumab (AVASTIN) SOLN 1.25 mg     Return in about 6 weeks (around 10/16/2021) for f/u PDR OU, DFE, OCT.  There are no Patient Instructions on file for this visit.  This document serves as a record of services personally performed by BGardiner Sleeper MD, PhD. It was created on their behalf by MOrvan Falconer an ophthalmic technician. The creation of this record is the provider's dictation and/or activities during the visit.    Electronically signed by: MOrvan Falconer OA,  09/04/21  4:41 PM  This document serves as a record of services personally performed by Gardiner Sleeper, MD, PhD. It was created on their behalf by San Jetty. Owens Shark, OA an ophthalmic technician. The creation of this record is the provider's dictation and/or activities during the visit.    Electronically signed by: San Jetty. Owens Shark, [place] 01.04.2022 4:41 PM  Gardiner Sleeper, M.D., Ph.D. Diseases & Surgery of the Retina and Vitreous [facility]  I have reviewed the above documentation for accuracy and completeness, and I agree with the  above. Gardiner Sleeper, M.D., Ph.D. 09/04/21 4:41 PM    Abbreviations: M myopia (nearsighted); A astigmatism; H hyperopia (farsighted); P presbyopia; Mrx spectacle prescription;  CTL contact lenses; OD right eye; OS left eye; OU both eyes  XT exotropia; ET esotropia; PEK punctate epithelial keratitis; PEE punctate epithelial erosions; DES dry eye syndrome; MGD meibomian gland dysfunction; ATs artificial tears; PFAT's preservative free artificial tears; [place] nuclear sclerotic cataract; PSC posterior subcapsular cataract; ERM epi-retinal membrane; PVD posterior vitreous detachment; RD retinal detachment; DM diabetes mellitus; DR diabetic retinopathy; NPDR non-proliferative diabetic retinopathy; PDR proliferative diabetic retinopathy; CSME clinically significant macular edema; DME diabetic macular edema; dbh dot blot hemorrhages; CWS cotton wool spot; POAG primary open angle glaucoma; C/D cup-to-disc ratio; HVF humphrey visual field; GVF goldmann visual field; OCT optical coherence tomography; IOP intraocular pressure; BRVO Branch retinal vein occlusion; CRVO central retinal vein occlusion; CRAO central retinal artery occlusion; BRAO branch retinal artery occlusion; RT retinal tear; SB scleral buckle; PPV pars plana vitrectomy; VH Vitreous hemorrhage; PRP panretinal laser photocoagulation; IVK intravitreal kenalog; VMT vitreomacular traction; MH Macular hole;  NVD neovascularization of the disc; NVE neovascularization elsewhere; AREDS age related eye disease study; ARMD age related macular degeneration; POAG primary open angle glaucoma; EBMD epithelial/anterior basement membrane dystrophy; ACIOL anterior chamber intraocular lens; IOL intraocular lens; PCIOL posterior chamber intraocular lens; Phaco/IOL phacoemulsification with intraocular lens placement; [place] photorefractive keratectomy; LASIK laser assisted in situ keratomileusis; HTN hypertension; DM diabetes mellitus; COPD chronic obstructive pulmonary disease

## 2021-09-04 ENCOUNTER — Other Ambulatory Visit: Payer: Self-pay

## 2021-09-04 ENCOUNTER — Encounter (INDEPENDENT_AMBULATORY_CARE_PROVIDER_SITE_OTHER): Payer: Self-pay | Admitting: Ophthalmology

## 2021-09-04 ENCOUNTER — Ambulatory Visit (INDEPENDENT_AMBULATORY_CARE_PROVIDER_SITE_OTHER): Payer: Commercial Managed Care - HMO | Admitting: Ophthalmology

## 2021-09-04 DIAGNOSIS — E113412 Type 2 diabetes mellitus with severe nonproliferative diabetic retinopathy with macular edema, left eye: Secondary | ICD-10-CM | POA: Diagnosis not present

## 2021-09-04 DIAGNOSIS — I10 Essential (primary) hypertension: Secondary | ICD-10-CM | POA: Diagnosis not present

## 2021-09-04 DIAGNOSIS — E113511 Type 2 diabetes mellitus with proliferative diabetic retinopathy with macular edema, right eye: Secondary | ICD-10-CM | POA: Diagnosis not present

## 2021-09-04 DIAGNOSIS — H35033 Hypertensive retinopathy, bilateral: Secondary | ICD-10-CM | POA: Diagnosis not present

## 2021-09-04 DIAGNOSIS — H25813 Combined forms of age-related cataract, bilateral: Secondary | ICD-10-CM

## 2021-09-04 MED ORDER — BEVACIZUMAB CHEMO INJECTION 1.25MG/0.05ML SYRINGE FOR KALEIDOSCOPE
1.2500 mg | INTRAVITREAL | Status: AC | PRN
  Administered 2021-09-04: 1.25 mg via INTRAVITREAL

## 2021-10-07 ENCOUNTER — Encounter (HOSPITAL_BASED_OUTPATIENT_CLINIC_OR_DEPARTMENT_OTHER): Payer: Self-pay | Admitting: Family

## 2021-10-07 ENCOUNTER — Encounter (HOSPITAL_BASED_OUTPATIENT_CLINIC_OR_DEPARTMENT_OTHER): Payer: Self-pay

## 2021-10-07 ENCOUNTER — Other Ambulatory Visit: Payer: Self-pay

## 2021-10-07 ENCOUNTER — Ambulatory Visit (INDEPENDENT_AMBULATORY_CARE_PROVIDER_SITE_OTHER): Payer: Medicare Other | Admitting: Family

## 2021-10-07 VITALS — BP 154/74 | HR 54 | Ht 70.0 in | Wt 151.0 lb

## 2021-10-07 DIAGNOSIS — I10 Essential (primary) hypertension: Secondary | ICD-10-CM

## 2021-10-07 DIAGNOSIS — I25118 Atherosclerotic heart disease of native coronary artery with other forms of angina pectoris: Secondary | ICD-10-CM | POA: Diagnosis not present

## 2021-10-07 DIAGNOSIS — I5042 Chronic combined systolic (congestive) and diastolic (congestive) heart failure: Secondary | ICD-10-CM

## 2021-10-07 DIAGNOSIS — E785 Hyperlipidemia, unspecified: Secondary | ICD-10-CM

## 2021-10-07 DIAGNOSIS — I2510 Atherosclerotic heart disease of native coronary artery without angina pectoris: Secondary | ICD-10-CM

## 2021-10-07 NOTE — Patient Instructions (Signed)
Medication Instructions:  Continue your current medication.   Please send [place] BP log when you get home.   *If you need a refill on your cardiac medications before your next appointment, please call your pharmacy*  Lab Work: None ordered today.   Testing/Procedures: None ordered today.   Follow-Up: At [facility], you and your health needs are our priority.  As part of our continuing mission to provide you with exceptional heart care, we have created designated Provider Care Teams.  These Care Teams include your primary Cardiologist (physician) and Advanced Practice Providers (APPs -  Physician Assistants and Nurse Practitioners) who all work together to provide you with the care you need, when you need it.  We recommend signing up for the patient portal called "MyChart".  Sign up information is provided on this After Visit Summary.  MyChart is used to connect with patients for Virtual Visits (Telemedicine).  Patients are able to view lab/test results, encounter notes, upcoming appointments, etc.  Non-urgent messages can be sent to your provider as well.   To learn more about what you can do with MyChart, go to [email or web address].    Your next appointment:   As scheduled June 5th at 11:20 with Dr. Oval Linsey.   Other Instructions  We have sent you a MyChart message - please send [place] the last two weeks of your blood pressure readings when you get home today.   Tips to Measure your Blood Pressure Correctly  If your blood pressure is consistently more than 130/80 or less than 110/60, please call and let us know.   Here's what you can do to ensure a correct reading:  Don't drink a caffeinated beverage or smoke during the 30 minutes before the test.  Sit quietly for five minutes before the test begins.  During the measurement, sit in a chair with your feet on the floor and your arm supported so your elbow is at about heart level.  The inflatable part of the cuff should completely  cover at least 80% of your upper arm, and the cuff should be placed on bare skin, not over a shirt.  Don't talk during the measurement.  Blood Pressure Log   Date   Time  Blood Pressure  Position  Example: Nov 1 9 AM 124/78 sitting
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
## 2021-10-10 ENCOUNTER — Telehealth: Payer: Self-pay | Admitting: Cardiovascular Disease

## 2021-10-10 ENCOUNTER — Other Ambulatory Visit: Payer: Self-pay | Admitting: Cardiovascular Disease

## 2021-10-10 NOTE — Telephone Encounter (Signed)
Pt c/o BP issue: STAT if pt c/o blurred vision, one-sided weakness or slurred speech  1. What are your last 5 BP readings?  All taken while sitting 2/05: 131/72 65 2/04: 151/69 53 2/03: 100/79 54 2/01: 115/72 59 1/31: 119/74 67 1/30: 137/80 57 1/29: 95/54 59 1/28: 139/86 61  2. Are you having any other symptoms (ex. Dizziness, headache, blurred vision, passed out)?   3. What is your BP issue?   Patient is calling to report BP readings for the week prior to 2/06 appointment with Laurann Montana, NP.

## 2021-10-11 ENCOUNTER — Encounter (HOSPITAL_BASED_OUTPATIENT_CLINIC_OR_DEPARTMENT_OTHER): Payer: Self-pay

## 2021-10-11 NOTE — Progress Notes (Shared)
[facility] Note  10/16/2021     CHIEF COMPLAINT Patient presents for No chief complaint on file.  HISTORY OF PRESENT ILLNESS: Earl Calderon is a 71 y.o. male who presents to the clinic today for:     Referring physician: [name], MD [address]  [address] [place],  [place] [address]  HISTORICAL INFORMATION:   Selected notes from the MEDICAL RECORD NUMBER Referred by Dr. [name] for severe NPDR OU   CURRENT MEDICATIONS: No current outpatient medications on file. (Ophthalmic Drugs)   No current facility-administered medications for this visit. (Ophthalmic Drugs)   Current Outpatient Medications (Other)  Medication Sig   APPLE CIDER VINEGAR PO Take 1 tablet by mouth daily at 6 (six) AM.   ASPIRIN LOW DOSE 81 MG EC tablet TAKE 1 TABLET BY MOUTH ONCE DAILY (SWALLOW WHOLE)   carvedilol (COREG) 12.5 MG tablet Take 1 tablet (12.5 mg total) by mouth 2 (two) times daily with a meal.   ELDERBERRY PO Take 100 mg by mouth daily.   empagliflozin (JARDIANCE) 10 MG TABS tablet Take 1 tablet (10 mg total) by mouth daily before breakfast.   ferrous sulfate 325 (65 FE) MG tablet Take 1 tablet (325 mg total) by mouth daily with breakfast.   furosemide (LASIX) 40 MG tablet TAKE 1 TABLET BY MOUTH ONCE DAILY   glucose blood (FREESTYLE TEST STRIPS) test strip Use as instructed   hydrALAZINE (APRESOLINE) 25 MG tablet Take 1 tablet (25 mg total) by mouth in the morning and at bedtime.   insulin aspart (NOVOLOG FLEXPEN) 100 UNIT/ML FlexPen Before each meal 3 times a day, 140-199 - 2 units, 200-250 - 4 units, 251-299 - 6 units,  300-349 - 8 units,  350 or above 10 units. Insulin PEN if approved, provide syringes and needles if needed.   Insulin Pen Needle (NOVOFINE PEN NEEDLE) 32G X 6 MM MISC USE AS DIRECTED   LEVEMIR FLEXTOUCH 100 UNIT/ML FlexPen Inject 8 Units into the skin 2 (two) times daily.   nitroGLYCERIN (NITROSTAT) 0.4 MG SL tablet Place 1  tablet (0.4 mg total) under the tongue every 5 (five) minutes as needed for chest pain.   Probiotic Product (PROBIOTIC MULTI-ENZYME) TABS Take 3 tablets by mouth daily at 6 (six) AM.   rosuvastatin (CRESTOR) 40 MG tablet Take 1 tablet (40 mg total) by mouth daily at 6 PM.   sacubitril-valsartan (ENTRESTO) 97-103 MG Take 1 tablet by mouth 2 (two) times daily.   spironolactone (ALDACTONE) 25 MG tablet Take 1 tablet (25 mg total) by mouth daily.   Turmeric 500 MG TABS Take 2 tablets by mouth daily at 6 (six) AM.   No current facility-administered medications for this visit. (Other)   REVIEW OF SYSTEMS:   ALLERGIES No Known Allergies  PAST MEDICAL HISTORY Past Medical History:  Diagnosis Date   CAD in native artery 01/01/2021   Prior LAD PCI.  Currently no symptoms of ischemia.  Encouraged him to do some light exercising.  Continue aspirin, carvedilol, and rosuvastatin.   Cataract    CKD (chronic kidney disease), stage III ([place]) 08/24/2020   Coronary artery disease    Diabetes mellitus without complication ([place])    on meds   Diabetic retinopathy ([place])    Goals of care, counseling/discussion 07/01/2021   Hernia, inguinal    Hypertension    on meds   Hypertensive retinopathy    Lymphadenopathy, cervical 07/01/2021   MGUS (monoclonal gammopathy of unknown  significance) 07/01/2021   NSVT (nonsustained ventricular tachycardia) 08/24/2020   Pure hypercholesterolemia 08/05/2021   Uncontrolled type 2 diabetes mellitus 11/11/2018   Past Surgical History:  Procedure Laterality Date   CORONARY STENT INTERVENTION N/A 11/15/2018   Procedure: CORONARY STENT INTERVENTION;  Surgeon: [name], MD;  Location: [facility];  Service: Cardiovascular;  Laterality: N/A;   HEMORROIDECTOMY     RIGHT HEART CATH N/A 07/18/2020   Procedure: RIGHT HEART CATH;  Surgeon: [name], MD;  Location: [facility];  Service: Cardiovascular;  Laterality: N/A;   RIGHT/LEFT HEART  CATH AND CORONARY ANGIOGRAPHY N/A 11/15/2018   Procedure: RIGHT/LEFT HEART CATH AND CORONARY ANGIOGRAPHY;  Surgeon: [name], MD;  Location: [facility];  Service: Cardiovascular;  Laterality: N/A;   WISDOM TOOTH EXTRACTION      FAMILY HISTORY Family History  Problem Relation Age of Onset   Hypertension Mother    Diabetes Mellitus II Mother    Arrhythmia Mother    Heart disease Mother    Hypertension Sister    Hypertension Brother    Heart disease Brother    Kidney disease Brother    Hypertension Other    Diabetes Mellitus II Other        All 9 sibblings have HTN   Colon cancer Neg Hx    Colon polyps Neg Hx    Esophageal cancer Neg Hx    Stomach cancer Neg Hx    Rectal cancer Neg Hx    SOCIAL HISTORY Social History   Tobacco Use   Smoking status: Never   Smokeless tobacco: Never  Vaping Use   Vaping Use: Never used  Substance Use Topics   Alcohol use: Not Currently   Drug use: Never       OPHTHALMIC EXAM:  Not recorded    IMAGING AND PROCEDURES  Imaging and Procedures for 10/16/2021          ASSESSMENT/PLAN:  No diagnosis found.  1,2. Proliferative diabetic retinopathy, OD  - delayed to follow up from 10.28.22 to 01.04.23 (9+ wks)  - severe NPDR OS  - s/p IVA OU #1 (06.06.22), #2 (07.08.22), #3 (08.05.22), #4 (09.02.22), #5 (09.30.22), #6 (10.28.22), #7 (01.04.23)  - s/p PRP OD (06.10.22) - exam shows central edema OU, scattered IRH -- improving - FA (06.06.22) shows OD: focal NVE IN arcades; OS: no NV OS; late leaking MA's OU -- s/p PRP OD 6.10.22 - OCT shows OD: Interval increase in IRF/IRHM, greatest nasal mac; OS: mild interval improvement in IRF/IRHM temporal macula at 9+ wks - recommend IVA OU #8 today, 02.15.23 w/ f/u in 6 wks - pt wishes to proceed - RBA of procedure discussed, questions answered - informed consent obtained and signed - see procedure note - IVA informed consent obtained and signed (OU), 06.06.22 - f/u 6  weeks for DFE/OCT, possible injection(s)  3,4. Hypertensive retinopathy OU - discussed importance of tight BP control - monitor    5. Mixed Cataract OU - The symptoms of cataract, surgical options, and treatments and risks were discussed with patient. - discussed diagnosis and progression - not yet visually significant - monitor for now  Ophthalmic Meds Ordered this visit:  No orders of the defined types were placed in this encounter.    No follow-ups on file.  There are no Patient Instructions on file for this visit.  This document serves as a record of services personally performed by Gardiner Sleeper, MD, PhD. It was created  on their behalf by Orvan Falconer, an ophthalmic technician. The creation of this record is the provider's dictation and/or activities during the visit.    Electronically signed by: Orvan Falconer, OA, 10/11/21  2:35 PM   Gardiner Sleeper, M.D., Ph.D. Diseases & Surgery of the Retina and Vitreous [facility]  I have reviewed the above documentation for accuracy and completeness, and I agree with the above. Gardiner Sleeper, M.D., Ph.D. 09/04/21 2:35 PM    Abbreviations: M myopia (nearsighted); A astigmatism; H hyperopia (farsighted); P presbyopia; Mrx spectacle prescription;  CTL contact lenses; OD right eye; OS left eye; OU both eyes  XT exotropia; ET esotropia; PEK punctate epithelial keratitis; PEE punctate epithelial erosions; DES dry eye syndrome; MGD meibomian gland dysfunction; ATs artificial tears; PFAT's preservative free artificial tears; [place] nuclear sclerotic cataract; PSC posterior subcapsular cataract; ERM epi-retinal membrane; PVD posterior vitreous detachment; RD retinal detachment; DM diabetes mellitus; DR diabetic retinopathy; NPDR non-proliferative diabetic retinopathy; PDR proliferative diabetic retinopathy; CSME clinically significant macular edema; DME diabetic macular edema; dbh dot blot hemorrhages; CWS cotton  wool spot; POAG primary open angle glaucoma; C/D cup-to-disc ratio; HVF humphrey visual field; GVF goldmann visual field; OCT optical coherence tomography; IOP intraocular pressure; BRVO Branch retinal vein occlusion; CRVO central retinal vein occlusion; CRAO central retinal artery occlusion; BRAO branch retinal artery occlusion; RT retinal tear; SB scleral buckle; PPV pars plana vitrectomy; VH Vitreous hemorrhage; PRP panretinal laser photocoagulation; IVK intravitreal kenalog; VMT vitreomacular traction; MH Macular hole;  NVD neovascularization of the disc; NVE neovascularization elsewhere; AREDS age related eye disease study; ARMD age related macular degeneration; POAG primary open angle glaucoma; EBMD epithelial/anterior basement membrane dystrophy; ACIOL anterior chamber intraocular lens; IOL intraocular lens; PCIOL posterior chamber intraocular lens; Phaco/IOL phacoemulsification with intraocular lens placement; [place] photorefractive keratectomy; LASIK laser assisted in situ keratomileusis; HTN hypertension; DM diabetes mellitus; COPD chronic obstructive pulmonary disease

## 2021-10-11 NOTE — Telephone Encounter (Signed)
BP at goal. Continue current medications. He should contact the office for BP consistently >130/80. BP somewhat labile, recommend checking at same time each day at least 2 hours after medications.   Loel Dubonnet, NP

## 2021-10-16 ENCOUNTER — Encounter (INDEPENDENT_AMBULATORY_CARE_PROVIDER_SITE_OTHER): Payer: Medicare Other | Admitting: Ophthalmology

## 2021-10-16 DIAGNOSIS — H35033 Hypertensive retinopathy, bilateral: Secondary | ICD-10-CM

## 2021-10-16 DIAGNOSIS — I10 Essential (primary) hypertension: Secondary | ICD-10-CM

## 2021-10-16 DIAGNOSIS — E113511 Type 2 diabetes mellitus with proliferative diabetic retinopathy with macular edema, right eye: Secondary | ICD-10-CM

## 2021-10-16 DIAGNOSIS — H25813 Combined forms of age-related cataract, bilateral: Secondary | ICD-10-CM

## 2021-10-18 NOTE — Progress Notes (Addendum)
[facility] Note  10/21/2021     CHIEF COMPLAINT Patient presents for Retina Follow Up  HISTORY OF PRESENT ILLNESS: Earl Calderon is a 71 y.o. male who presents to the clinic today for:   HPI     Retina Follow Up   Patient presents with  Diabetic Retinopathy.  In right eye.  Severity is moderate.  Duration of 6 weeks.  Since onset it is stable.  I, the attending physician,  performed the HPI with the patient and updated documentation appropriately.        Comments   Pt here for 6 wk ret f/u PDR OD. Pt states vision is about the same, no changes.       Last edited by [name], MD on 10/23/2021 11:54 AM.     Referring physician: Debbra Riding, MD [address] [place],  [place] [address]  HISTORICAL INFORMATION:   Selected notes from the MEDICAL RECORD NUMBER Referred by Dr. [name] for severe NPDR OU   CURRENT MEDICATIONS: No current outpatient medications on file. (Ophthalmic Drugs)   No current facility-administered medications for this visit. (Ophthalmic Drugs)   Current Outpatient Medications (Other)  Medication Sig   APPLE CIDER VINEGAR PO Take 1 tablet by mouth daily at 6 (six) AM.   ASPIRIN LOW DOSE 81 MG EC tablet TAKE 1 TABLET BY MOUTH ONCE DAILY (SWALLOW WHOLE)   ELDERBERRY PO Take 100 mg by mouth daily.   empagliflozin (JARDIANCE) 10 MG TABS tablet Take 1 tablet (10 mg total) by mouth daily before breakfast.   ferrous sulfate 325 (65 FE) MG tablet Take 1 tablet (325 mg total) by mouth daily with breakfast.   furosemide (LASIX) 40 MG tablet TAKE 1 TABLET BY MOUTH ONCE DAILY   glucose blood (FREESTYLE TEST STRIPS) test strip Use as instructed   hydrALAZINE (APRESOLINE) 25 MG tablet Take 1 tablet (25 mg total) by mouth in the morning and at bedtime.   insulin aspart (NOVOLOG FLEXPEN) 100 UNIT/ML FlexPen Before each meal 3 times a day, 140-199 - 2 units, 200-250 - 4 units, 251-299 - 6 units,  300-349 - 8 units,   350 or above 10 units. Insulin PEN if approved, provide syringes and needles if needed.   Insulin Pen Needle (NOVOFINE PEN NEEDLE) 32G X 6 MM MISC USE AS DIRECTED   LEVEMIR FLEXTOUCH 100 UNIT/ML FlexPen Inject 8 Units into the skin 2 (two) times daily.   Probiotic Product (PROBIOTIC MULTI-ENZYME) TABS Take 3 tablets by mouth daily at 6 (six) AM.   sacubitril-valsartan (ENTRESTO) 97-103 MG Take 1 tablet by mouth 2 (two) times daily.   Turmeric 500 MG TABS Take 2 tablets by mouth daily at 6 (six) AM.   carvedilol (COREG) 12.5 MG tablet Take 1 tablet (12.5 mg total) by mouth 2 (two) times daily with a meal.   nitroGLYCERIN (NITROSTAT) 0.4 MG SL tablet Place 1 tablet (0.4 mg total) under the tongue every 5 (five) minutes as needed for chest pain.   rosuvastatin (CRESTOR) 40 MG tablet Take 1 tablet (40 mg total) by mouth daily at 6 PM.   spironolactone (ALDACTONE) 25 MG tablet Take 1 tablet (25 mg total) by mouth daily.   No current facility-administered medications for this visit. (Other)   REVIEW OF SYSTEMS: ROS   Positive for: Endocrine, Cardiovascular, Eyes Negative for: Constitutional, Gastrointestinal, Neurological, Skin, Genitourinary, Musculoskeletal, HENT, Respiratory, Psychiatric, Allergic/Imm, Heme/Lymph Last edited by Kingsley Spittle, COT on  10/21/2021  1:24 PM.     ALLERGIES No Known Allergies  PAST MEDICAL HISTORY Past Medical History:  Diagnosis Date   CAD in native artery 01/01/2021   Prior LAD PCI.  Currently no symptoms of ischemia.  Encouraged him to do some light exercising.  Continue aspirin, carvedilol, and rosuvastatin.   Cataract    CKD (chronic kidney disease), stage III ([place]) 08/24/2020   Coronary artery disease    Diabetes mellitus without complication ([place])    on meds   Diabetic retinopathy ([place])    Goals of care, counseling/discussion 07/01/2021   Hernia, inguinal    Hypertension    on meds   Hypertensive retinopathy    Lymphadenopathy, cervical  07/01/2021   MGUS (monoclonal gammopathy of unknown significance) 07/01/2021   NSVT (nonsustained ventricular tachycardia) 08/24/2020   Pure hypercholesterolemia 08/05/2021   Uncontrolled type 2 diabetes mellitus 11/11/2018   Past Surgical History:  Procedure Laterality Date   CORONARY STENT INTERVENTION N/A 11/15/2018   Procedure: CORONARY STENT INTERVENTION;  Surgeon: [name], MD;  Location: [facility];  Service: Cardiovascular;  Laterality: N/A;   HEMORROIDECTOMY     RIGHT HEART CATH N/A 07/18/2020   Procedure: RIGHT HEART CATH;  Surgeon: [name], MD;  Location: [facility];  Service: Cardiovascular;  Laterality: N/A;   RIGHT/LEFT HEART CATH AND CORONARY ANGIOGRAPHY N/A 11/15/2018   Procedure: RIGHT/LEFT HEART CATH AND CORONARY ANGIOGRAPHY;  Surgeon: [name], MD;  Location: [facility];  Service: Cardiovascular;  Laterality: N/A;   WISDOM TOOTH EXTRACTION     FAMILY HISTORY Family History  Problem Relation Age of Onset   Hypertension Mother    Diabetes Mellitus II Mother    Arrhythmia Mother    Heart disease Mother    Hypertension Sister    Hypertension Brother    Heart disease Brother    Kidney disease Brother    Hypertension Other    Diabetes Mellitus II Other        All 9 sibblings have HTN   Colon cancer Neg Hx    Colon polyps Neg Hx    Esophageal cancer Neg Hx    Stomach cancer Neg Hx    Rectal cancer Neg Hx    SOCIAL HISTORY Social History   Tobacco Use   Smoking status: Never   Smokeless tobacco: Never  Vaping Use   Vaping Use: Never used  Substance Use Topics   Alcohol use: Not Currently   Drug use: Never       OPHTHALMIC EXAM:  Base Eye Exam     Visual Acuity (Snellen - Linear)       Right Left   Dist [place] 20/40 +2 20/60   Dist ph [place] 20/30 -1 NI         Tonometry (Tonopen, 1:31 PM)       Right Left   Pressure 12 10         Pupils       Dark Light Shape React APD   Right 4 3  Round Brisk None   Left 4 3 Round Brisk None         Visual Fields (Counting fingers)       Left Right   Restrictions Partial outer superior nasal deficiency Partial outer superior temporal deficiency         Extraocular Movement       Right Left    Full, Ortho Full, Ortho  Neuro/Psych     Oriented x3: Yes   Mood/Affect: Normal         Dilation     Both eyes: 1.0% Mydriacyl, 2.5% Phenylephrine @ 1:32 PM           Slit Lamp and Fundus Exam     Slit Lamp Exam       Right Left   Lids/Lashes Dermatochalasis - upper lid Dermatochalasis - upper lid   Conjunctiva/Sclera Melanosis Melanosis   Cornea arcus arcus   Anterior Chamber deep and clear deep and clear   Iris Round and poorly dilated, No NVI Round and dilated, No NVI   Lens 2-3+ Nuclear sclerosis, 2-3+ Cortical cataract 2-3+ Nuclear sclerosis, 2-3+ Cortical cataract   Anterior Vitreous Vitreous syneresis Vitreous syneresis         Fundus Exam       Right Left   Disc Pink and Sharp, no NVD Pink and Sharp, no NVD   C/D Ratio 0.6 0.4   Macula Blunted foveal reflex, central edema--slightly improved, scattered MA/DBH - improved, no exudates good foveal reflex, scattered MA/DBH, +edema -- improved, +exudates and CWS -- improved   Vessels attenuated, tortuous, focal NV IN arcades--regressing attenuated, Tortuous   Periphery Attached, 360 MA/DBH greatest posteriorly, light 360 PRP changes Attached, 360 DBH           IMAGING AND PROCEDURES  Imaging and Procedures for 10/21/2021  OCT, Retina - OU - Both Eyes       Right Eye Quality was good. Central Foveal Thickness: 274. Progression has improved. Findings include abnormal foveal contour, intraretinal fluid, intraretinal hyper-reflective material, vitreomacular adhesion , no SRF (Interval improvement in IRF and foveal contour).   Left Eye Quality was good. Central Foveal Thickness: 213. Progression has improved. Findings include intraretinal  fluid, no SRF, intraretinal hyper-reflective material, vitreomacular adhesion , normal foveal contour (Interval improvement in IRF/IRHM temporal fovea, patchy ORA).   Notes *Images captured and stored on drive  Diagnosis / Impression:  Abnormal foveal contour and +DME OU OD: Interval improvement in IRF and foveal contour OS: Interval improvement in IRF/IRHM temporal fovea, patchy ORA  Clinical management:  See below  Abbreviations: NFP - Normal foveal profile. CME - cystoid macular edema. PED - pigment epithelial detachment. IRF - intraretinal fluid. SRF - subretinal fluid. EZ - ellipsoid zone. ERM - epiretinal membrane. ORA - outer retinal atrophy. ORT - outer retinal tubulation. SRHM - subretinal hyper-reflective material. IRHM - intraretinal hyper-reflective material      Intravitreal Injection, Pharmacologic Agent - OD - Right Eye       Time Out 10/21/2021. 2:38 PM. Confirmed correct patient, procedure, site, and patient consented.   Anesthesia Topical anesthesia was used. Anesthetic medications included Lidocaine 2%, Proparacaine 0.5%.   Procedure Preparation included 5% betadine to ocular surface, eyelid speculum. A supplied needle was used.   Injection: 1.25 mg Bevacizumab 1.39m/0.05ml   Route: Intravitreal, Site: Right Eye   NDC:: 54008-676-19 Lot: [id number] , Expiration date: 11/27/2021   Post-op Post injection exam found visual acuity of at least counting fingers. The patient tolerated the procedure well. There were no complications. The patient received written and verbal post procedure care education. Post injection medications were not given.      Intravitreal Injection, Pharmacologic Agent - OS - Left Eye       Time Out 10/21/2021. 2:43 PM. Confirmed correct patient, procedure, site, and patient consented.   Anesthesia Topical anesthesia was used. Anesthetic medications included Lidocaine 2%, Proparacaine 0.5%.  Procedure Preparation included 5% betadine  to ocular surface, eyelid speculum. A (32g) needle was used.   Injection: 1.25 mg Bevacizumab 1.57m/0.05ml   Route: Intravitreal, Site: Left Eye   NDC:: 65465-035-46 Lot: [id number], Expiration date: 11/11/2021   Post-op Post injection exam found visual acuity of at least counting fingers. The patient tolerated the procedure well. There were no complications. The patient received written and verbal post procedure care education. Post injection medications were not given.            ASSESSMENT/PLAN:    ICD-10-CM   1. Proliferative diabetic retinopathy of right eye with macular edema associated with type 2 diabetes mellitus (HCC)  E11.3511 OCT, Retina - OU - Both Eyes    Intravitreal Injection, Pharmacologic Agent - OD - Right Eye    Bevacizumab (AVASTIN) SOLN 1.25 mg    2. Severe nonproliferative diabetic retinopathy of left eye with macular edema associated with type 2 diabetes mellitus (HCC)  EF68.1275OCT, Retina - OU - Both Eyes    Intravitreal Injection, Pharmacologic Agent - OS - Left Eye    Bevacizumab (AVASTIN) SOLN 1.25 mg    3. Essential hypertension  I10     4. Hypertensive retinopathy of both eyes  H35.033     5. Combined forms of age-related cataract of both eyes  H25.813       1,2. Proliferative diabetic retinopathy, OD  - severe NPDR OS - delayed to follow up from 10.28.22 to 01.04.23 (9+ wks) - s/p IVA OU #1 (06.06.22), #2 (07.08.22), #3 (08.05.22), #4 (09.02.22), #5 (09.30.22), #6 (10.28.22), #7 (01.04.23)  - s/p PRP OD (06.10.22) - exam shows central edema OU, scattered IRH -- improving - FA (06.06.22) shows OD: focal NVE IN arcades; OS: no NV OS; late leaking MA's OU -- s/p PRP OD 6.10.22 - OCT shows OD: Interval improvement in IRF and foveal contour; OS: Interval improvement in IRF/IRHM temporal fovea, patchy ORA at 6 weeks - recommend IVA OU #8 today, 02.20.23 w/ f/u in 6 wks - pt wishes to proceed - RBA of procedure discussed, questions answered -  informed consent obtained and signed - see procedure note - IVA informed consent obtained and signed (OU), 06.06.22 - f/u 6 weeks for DFE/OCT, possible injection(s)  3,4. Hypertensive retinopathy OU - discussed importance of tight BP control - monitor  5. Mixed Cataract OU - The symptoms of cataract, surgical options, and treatments and risks were discussed with patient. - discussed diagnosis and progression - under the expert management of Dr. SWyatt Portela- clear from a retina standpoint to proceed with cataract surgery when pt and surgeon are ready  Ophthalmic Meds Ordered this visit:  Meds ordered this encounter  Medications   Bevacizumab (AVASTIN) SOLN 1.25 mg   Bevacizumab (AVASTIN) SOLN 1.25 mg     Return in about 6 weeks (around 12/02/2021) for f/u PDR OU, DFE, OCT.  There are no Patient Instructions on file for this visit.  This document serves as a record of services personally performed by BGardiner Sleeper MD, PhD. It was created on their behalf by DRoselee Nova COMT. The creation of this record is the provider's dictation and/or activities during the visit.  Electronically signed by: DRoselee Nova COMT 10/23/21 11:59 AM   BGardiner Sleeper M.D., Ph.D. Diseases & Surgery of the Retina and Vitreous [facility] I have reviewed the above documentation for accuracy and completeness, and I agree with the above. BGardiner Sleeper M.D., Ph.D.  10/23/21 11:59 AM  Abbreviations: M myopia (nearsighted); A astigmatism; H hyperopia (farsighted); P presbyopia; Mrx spectacle prescription;  CTL contact lenses; OD right eye; OS left eye; OU both eyes  XT exotropia; ET esotropia; PEK punctate epithelial keratitis; PEE punctate epithelial erosions; DES dry eye syndrome; MGD meibomian gland dysfunction; ATs artificial tears; PFAT's preservative free artificial tears; [place] nuclear sclerotic cataract; PSC posterior subcapsular cataract; ERM epi-retinal membrane; PVD  posterior vitreous detachment; RD retinal detachment; DM diabetes mellitus; DR diabetic retinopathy; NPDR non-proliferative diabetic retinopathy; PDR proliferative diabetic retinopathy; CSME clinically significant macular edema; DME diabetic macular edema; dbh dot blot hemorrhages; CWS cotton wool spot; POAG primary open angle glaucoma; C/D cup-to-disc ratio; HVF humphrey visual field; GVF goldmann visual field; OCT optical coherence tomography; IOP intraocular pressure; BRVO Branch retinal vein occlusion; CRVO central retinal vein occlusion; CRAO central retinal artery occlusion; BRAO branch retinal artery occlusion; RT retinal tear; SB scleral buckle; PPV pars plana vitrectomy; VH Vitreous hemorrhage; PRP panretinal laser photocoagulation; IVK intravitreal kenalog; VMT vitreomacular traction; MH Macular hole;  NVD neovascularization of the disc; NVE neovascularization elsewhere; AREDS age related eye disease study; ARMD age related macular degeneration; POAG primary open angle glaucoma; EBMD epithelial/anterior basement membrane dystrophy; ACIOL anterior chamber intraocular lens; IOL intraocular lens; PCIOL posterior chamber intraocular lens; Phaco/IOL phacoemulsification with intraocular lens placement; [place] photorefractive keratectomy; LASIK laser assisted in situ keratomileusis; HTN hypertension; DM diabetes mellitus; COPD chronic obstructive pulmonary disease

## 2021-10-21 ENCOUNTER — Ambulatory Visit (INDEPENDENT_AMBULATORY_CARE_PROVIDER_SITE_OTHER): Payer: Medicare Other | Admitting: Ophthalmology

## 2021-10-21 ENCOUNTER — Encounter (INDEPENDENT_AMBULATORY_CARE_PROVIDER_SITE_OTHER): Payer: Self-pay | Admitting: Ophthalmology

## 2021-10-21 ENCOUNTER — Other Ambulatory Visit: Payer: Self-pay

## 2021-10-21 DIAGNOSIS — E113412 Type 2 diabetes mellitus with severe nonproliferative diabetic retinopathy with macular edema, left eye: Secondary | ICD-10-CM

## 2021-10-21 DIAGNOSIS — E113511 Type 2 diabetes mellitus with proliferative diabetic retinopathy with macular edema, right eye: Secondary | ICD-10-CM | POA: Diagnosis not present

## 2021-10-21 DIAGNOSIS — H25813 Combined forms of age-related cataract, bilateral: Secondary | ICD-10-CM

## 2021-10-21 DIAGNOSIS — H35033 Hypertensive retinopathy, bilateral: Secondary | ICD-10-CM

## 2021-10-21 DIAGNOSIS — I10 Essential (primary) hypertension: Secondary | ICD-10-CM

## 2021-10-23 ENCOUNTER — Encounter (INDEPENDENT_AMBULATORY_CARE_PROVIDER_SITE_OTHER): Payer: Self-pay | Admitting: Ophthalmology

## 2021-10-23 MED ORDER — BEVACIZUMAB CHEMO INJECTION 1.25MG/0.05ML SYRINGE FOR KALEIDOSCOPE
1.2500 mg | INTRAVITREAL | Status: AC | PRN
  Administered 2021-10-23: 1.25 mg via INTRAVITREAL

## 2021-10-29 ENCOUNTER — Other Ambulatory Visit: Payer: Self-pay | Admitting: Cardiovascular Disease

## 2021-10-29 NOTE — Telephone Encounter (Signed)
Rx(s) sent to pharmacy electronically.  

## 2021-11-11 ENCOUNTER — Other Ambulatory Visit: Payer: Self-pay

## 2021-11-11 MED ORDER — FERROUS SULFATE 325 (65 FE) MG PO TABS: 325.0000 mg | ORAL_TABLET | Freq: Every day | ORAL | 3 refills | Status: AC

## 2021-11-22 ENCOUNTER — Other Ambulatory Visit: Payer: Self-pay | Admitting: *Deleted

## 2021-11-22 DIAGNOSIS — R590 Localized enlarged lymph nodes: Secondary | ICD-10-CM

## 2021-11-22 DIAGNOSIS — D472 Monoclonal gammopathy: Secondary | ICD-10-CM

## 2021-11-25 ENCOUNTER — Inpatient Hospital Stay: Payer: Medicare Other | Admitting: Hematology & Oncology

## 2021-11-25 ENCOUNTER — Inpatient Hospital Stay: Payer: Medicare Other

## 2021-11-26 LAB — BASIC METABOLIC PANEL
BUN/Creatinine Ratio: 15 (ref 10–24)
BUN: 19 mg/dL (ref 8–27)
CO2: 26 mmol/L (ref 20–29)
Calcium: 9.3 mg/dL (ref 8.6–10.2)
Chloride: 109 mmol/L — ABNORMAL HIGH (ref 96–106)
Creatinine, Ser: 1.3 mg/dL — ABNORMAL HIGH (ref 0.76–1.27)
Glucose: 266 mg/dL — ABNORMAL HIGH (ref 70–99)
Potassium: 4.4 mmol/L (ref 3.5–5.2)
Sodium: 148 mmol/L — ABNORMAL HIGH (ref 134–144)
eGFR: 60 mL/min/{1.73_m2} (ref >59)

## 2021-11-29 NOTE — Progress Notes (Signed)
?[facility] Note ? ?12/02/2021 ? ?  ? ?CHIEF COMPLAINT ?Patient presents for Retina Follow Up ? ?HISTORY OF PRESENT ILLNESS: ?Earl Calderon is a 71 y.o. male who presents to the clinic today for:  ? ?HPI   ? ? Retina Follow Up   ?Patient presents with  Diabetic Retinopathy.  Severity is moderate.  Duration of 6 weeks.  Since onset it is stable.  I, the attending physician,  performed the HPI with the patient and updated documentation appropriately. ? ?  ?  ? ? Comments   ?Pt here for 6 wk ret f/u for PDR OD. Pt states VA is about the same.  ? ?  ?  ?Last edited by [name], MD on 12/02/2021  1:44 PM.  ?  ?Pt is wondering what his prognosis and time table is for his vision to stabilize, he states his blood sugar was in the 300's this morning, but it has been higher, he states he has been very fatigued lately, he states he does not sleep well at night, his BP has been high as well, sometimes over 200 ? ?Referring physician: ?[name], MD ?[address]  ?[address] ?[place],  [place] [address] ? ?HISTORICAL INFORMATION:  ? ?Selected notes from the [facility] ?Referred by Dr. [name] for severe NPDR OU  ? ?CURRENT MEDICATIONS: ?No current outpatient medications on file. (Ophthalmic Drugs)  ? ?No current facility-administered medications for this visit. (Ophthalmic Drugs)  ? ?Current Outpatient Medications (Other)  ?Medication Sig  ? APPLE CIDER VINEGAR PO Take 1 tablet by mouth daily at 6 (six) AM.  ? ASPIRIN LOW DOSE 81 MG EC tablet TAKE 1 TABLET BY MOUTH ONCE DAILY (SWALLOW WHOLE)  ? ELDERBERRY PO Take 100 mg by mouth daily.  ? empagliflozin (JARDIANCE) 10 MG TABS tablet Take 1 tablet (10 mg total) by mouth daily before breakfast.  ? ENTRESTO 97-103 MG TAKE 1 TABLET BY MOUTH TWICE DAILY  ? ferrous sulfate 325 (65 FE) MG tablet Take 1 tablet (325 mg total) by mouth daily with breakfast.  ? furosemide (LASIX) 40 MG tablet TAKE 1 TABLET BY MOUTH ONCE DAILY  ?  glucose blood (FREESTYLE TEST STRIPS) test strip Use as instructed  ? insulin aspart (NOVOLOG FLEXPEN) 100 UNIT/ML FlexPen Before each meal 3 times a day, 140-199 - 2 units, 200-250 - 4 units, 251-299 - 6 units,  300-349 - 8 units,  350 or above 10 units. Insulin PEN if approved, provide syringes and needles if needed.  ? Insulin Pen Needle (NOVOFINE PEN NEEDLE) 32G X 6 MM MISC USE AS DIRECTED  ? LEVEMIR FLEXTOUCH 100 UNIT/ML FlexPen Inject 8 Units into the skin 2 (two) times daily.  ? Probiotic Product (PROBIOTIC MULTI-ENZYME) TABS Take 3 tablets by mouth daily at 6 (six) AM.  ? Turmeric 500 MG TABS Take 2 tablets by mouth daily at 6 (six) AM.  ? carvedilol (COREG) 12.5 MG tablet Take 1 tablet (12.5 mg total) by mouth 2 (two) times daily with a meal.  ? hydrALAZINE (APRESOLINE) 25 MG tablet Take 1 tablet (25 mg total) by mouth in the morning and at bedtime.  ? nitroGLYCERIN (NITROSTAT) 0.4 MG SL tablet Place 1 tablet (0.4 mg total) under the tongue every 5 (five) minutes as needed for chest pain.  ? rosuvastatin (CRESTOR) 40 MG tablet Take 1 tablet (40 mg total) by mouth daily at 6 PM.  ? spironolactone (ALDACTONE) 25 MG tablet Take  1 tablet (25 mg total) by mouth daily.  ? ?No current facility-administered medications for this visit. (Other)  ? ?REVIEW OF SYSTEMS: ?ROS   ?Positive for: Endocrine, Cardiovascular, Eyes ?Negative for: Constitutional, Gastrointestinal, Neurological, Skin, Genitourinary, Musculoskeletal, HENT, Respiratory, Psychiatric, Allergic/Imm, Heme/Lymph ?Last edited by Kingsley Spittle, COT on 12/02/2021 12:55 PM.  ?  ? ?ALLERGIES ?No Known Allergies ? ?PAST MEDICAL HISTORY ?Past Medical History:  ?Diagnosis Date  ? CAD in native artery 01/01/2021  ? Prior LAD PCI.  Currently no symptoms of ischemia.  Encouraged him to do some light exercising.  Continue aspirin, carvedilol, and rosuvastatin.  ? Cataract   ? CKD (chronic kidney disease), stage III ([place]) 08/24/2020  ? Coronary artery disease   ?  Diabetes mellitus without complication ([place])   ? on meds  ? Diabetic retinopathy ([place])   ? Goals of care, counseling/discussion 07/01/2021  ? Hernia, inguinal   ? Hypertension   ? on meds  ? Hypertensive retinopathy   ? Lymphadenopathy, cervical 07/01/2021  ? MGUS (monoclonal gammopathy of unknown significance) 07/01/2021  ? NSVT (nonsustained ventricular tachycardia) ([place]) 08/24/2020  ? Pure hypercholesterolemia 08/05/2021  ? Uncontrolled type 2 diabetes mellitus 11/11/2018  ? ?Past Surgical History:  ?Procedure Laterality Date  ? CORONARY STENT INTERVENTION N/A 11/15/2018  ? Procedure: CORONARY STENT INTERVENTION;  Surgeon: [name], MD;  Location: [facility];  Service: Cardiovascular;  Laterality: N/A;  ? HEMORROIDECTOMY    ? RIGHT HEART CATH N/A 07/18/2020  ? Procedure: RIGHT HEART CATH;  Surgeon: [name], MD;  Location: [facility];  Service: Cardiovascular;  Laterality: N/A;  ? RIGHT/LEFT HEART CATH AND CORONARY ANGIOGRAPHY N/A 11/15/2018  ? Procedure: RIGHT/LEFT HEART CATH AND CORONARY ANGIOGRAPHY;  Surgeon: [name], MD;  Location: [facility];  Service: Cardiovascular;  Laterality: N/A;  ? WISDOM TOOTH EXTRACTION    ? ?FAMILY HISTORY ?Family History  ?Problem Relation Age of Onset  ? Hypertension Mother   ? Diabetes Mellitus II Mother   ? Arrhythmia Mother   ? Heart disease Mother   ? Hypertension Sister   ? Hypertension Brother   ? Heart disease Brother   ? Kidney disease Brother   ? Hypertension Other   ? Diabetes Mellitus II Other   ?     All 9 sibblings have HTN  ? Colon cancer Neg Hx   ? Colon polyps Neg Hx   ? Esophageal cancer Neg Hx   ? Stomach cancer Neg Hx   ? Rectal cancer Neg Hx   ? ?SOCIAL HISTORY ?Social History  ? ?Tobacco Use  ? Smoking status: Never  ? Smokeless tobacco: Never  ?Vaping Use  ? Vaping Use: Never used  ?Substance Use Topics  ? Alcohol use: Not Currently  ? Drug use: Never  ?  ? ?  ?OPHTHALMIC EXAM: ? ?Base Eye Exam   ? ?  Visual Acuity (Snellen - Linear)   ? ?   Right Left  ? Dist [place] 20/50 20/60  ? Dist ph [place] 20/30 -2 20/50  ? ?  ?  ? ? Tonometry (Tonopen, 1:05 PM)   ? ?   Right Left  ? Pressure 10 12  ? ?  ?  ? ? Pupils   ? ?   Dark Light Shape React APD  ? Right 4 3 Round Brisk None  ? Left 4 3 Round Brisk None  ? ?  ?  ? ? Visual Fields (Counting fingers)   ? ?  Left Right  ? Restrictions Partial outer superior nasal deficiency Partial outer superior temporal deficiency  ? ?  ?  ? ? Extraocular Movement   ? ?   Right Left  ?  Full, Ortho Full, Ortho  ? ?  ?  ? ? Neuro/Psych   ? ? Oriented x3: Yes  ? Mood/Affect: Normal  ? ?  ?  ? ? Dilation   ? ? Both eyes: 1.0% Mydriacyl, 2.5% Phenylephrine @ 1:05 PM  ? ?  ?  ? ?  ? ?Slit Lamp and Fundus Exam   ? ? Slit Lamp Exam   ? ?   Right Left  ? Lids/Lashes Dermatochalasis - upper lid Dermatochalasis - upper lid  ? Conjunctiva/Sclera Melanosis Melanosis  ? Cornea arcus arcus  ? Anterior Chamber deep and clear deep and clear  ? Iris Round and poorly dilated, No NVI Round and dilated, No NVI  ? Lens 2-3+ Nuclear sclerosis, 2-3+ Cortical cataract 2-3+ Nuclear sclerosis, 2-3+ Cortical cataract  ? Anterior Vitreous Vitreous syneresis Vitreous syneresis  ? ?  ?  ? ? Fundus Exam   ? ?   Right Left  ? Disc Pink and Sharp, no NVD Pink and Sharp, no NVD  ? C/D Ratio 0.5 0.4  ? Macula Blunted foveal reflex, central edema -- slightly increased, scattered MA/DBH - improved, focal exudates temporal macula good foveal reflex, scattered MA/DBH, +edema temporal macual -- increased, +exudates and CWS -- improved  ? Vessels attenuated, tortuous, focal NV IN arcades--regressing attenuated, Tortuous  ? Periphery Attached, 360 MA/DBH greatest posteriorly, light 360 PRP changes Attached, 360 DBH  ? ?  ?  ? ?  ? ?IMAGING AND PROCEDURES  ?Imaging and Procedures for 12/02/2021 ? ?OCT, Retina - OU - Both Eyes   ? ?   ?Right Eye ?Quality was good. Central Foveal Thickness: 298. Progression has worsened. Findings  include abnormal foveal contour, intraretinal fluid, intraretinal hyper-reflective material, vitreomacular adhesion , no SRF (Interval increase in IRF/edema temporal macula and fovea).  ? ?Left Eye ?Quality w

## 2021-12-02 ENCOUNTER — Encounter (INDEPENDENT_AMBULATORY_CARE_PROVIDER_SITE_OTHER): Payer: Self-pay | Admitting: Ophthalmology

## 2021-12-02 ENCOUNTER — Ambulatory Visit (INDEPENDENT_AMBULATORY_CARE_PROVIDER_SITE_OTHER): Payer: Medicare Other | Admitting: Ophthalmology

## 2021-12-02 DIAGNOSIS — I10 Essential (primary) hypertension: Secondary | ICD-10-CM

## 2021-12-02 DIAGNOSIS — H35033 Hypertensive retinopathy, bilateral: Secondary | ICD-10-CM

## 2021-12-02 DIAGNOSIS — E113511 Type 2 diabetes mellitus with proliferative diabetic retinopathy with macular edema, right eye: Secondary | ICD-10-CM

## 2021-12-02 DIAGNOSIS — E113412 Type 2 diabetes mellitus with severe nonproliferative diabetic retinopathy with macular edema, left eye: Secondary | ICD-10-CM

## 2021-12-02 DIAGNOSIS — H25813 Combined forms of age-related cataract, bilateral: Secondary | ICD-10-CM

## 2021-12-02 MED ORDER — BEVACIZUMAB CHEMO INJECTION 1.25MG/0.05ML SYRINGE FOR KALEIDOSCOPE
1.2500 mg | INTRAVITREAL | Status: AC | PRN
  Administered 2021-12-02: 1.25 mg via INTRAVITREAL

## 2021-12-20 ENCOUNTER — Other Ambulatory Visit: Payer: Self-pay | Admitting: *Deleted

## 2021-12-20 DIAGNOSIS — D472 Monoclonal gammopathy: Secondary | ICD-10-CM

## 2021-12-20 DIAGNOSIS — R590 Localized enlarged lymph nodes: Secondary | ICD-10-CM

## 2021-12-23 ENCOUNTER — Inpatient Hospital Stay (HOSPITAL_BASED_OUTPATIENT_CLINIC_OR_DEPARTMENT_OTHER): Payer: Medicare Other | Admitting: Hematology & Oncology

## 2021-12-23 ENCOUNTER — Inpatient Hospital Stay: Payer: Medicare Other | Attending: Hematology & Oncology

## 2021-12-23 ENCOUNTER — Ambulatory Visit (HOSPITAL_BASED_OUTPATIENT_CLINIC_OR_DEPARTMENT_OTHER)
Admission: RE | Admit: 2021-12-23 | Discharge: 2021-12-23 | Disposition: A | Discharge status: Home / Self Care | Payer: Medicare Other | Source: Ambulatory Visit | Attending: Hematology & Oncology | Admitting: Hematology & Oncology

## 2021-12-23 ENCOUNTER — Encounter: Payer: Self-pay | Admitting: Hematology & Oncology

## 2021-12-23 VITALS — BP 125/72 | HR 57 | Temp 97.5°F | Resp 20 | Wt 147.4 lb

## 2021-12-23 DIAGNOSIS — D472 Monoclonal gammopathy: Secondary | ICD-10-CM | POA: Insufficient documentation

## 2021-12-23 DIAGNOSIS — R590 Localized enlarged lymph nodes: Secondary | ICD-10-CM

## 2021-12-23 DIAGNOSIS — Z79899 Other long term (current) drug therapy: Secondary | ICD-10-CM | POA: Insufficient documentation

## 2021-12-23 DIAGNOSIS — E119 Type 2 diabetes mellitus without complications: Secondary | ICD-10-CM | POA: Diagnosis not present

## 2021-12-23 DIAGNOSIS — Z7982 Long term (current) use of aspirin: Secondary | ICD-10-CM | POA: Diagnosis not present

## 2021-12-23 LAB — CBC WITH DIFFERENTIAL (CANCER CENTER ONLY)
Abs Immature Granulocytes: 0.01 10*3/uL (ref 0.00–0.07)
Basophils Absolute: 0 10*3/uL (ref 0.0–0.1)
Basophils Relative: 1 %
Eosinophils Absolute: 0.2 10*3/uL (ref 0.0–0.5)
Eosinophils Relative: 3 %
HCT: 34.8 % — ABNORMAL LOW (ref 39.0–52.0)
Hemoglobin: 11.6 g/dL — ABNORMAL LOW (ref 13.0–17.0)
Immature Granulocytes: 0 %
Lymphocytes Relative: 18 %
Lymphs Abs: 1 10*3/uL (ref 0.7–4.0)
MCH: 32 pg (ref 26.0–34.0)
MCHC: 33.3 g/dL (ref 30.0–36.0)
MCV: 95.9 fL (ref 80.0–100.0)
Monocytes Absolute: 0.4 10*3/uL (ref 0.1–1.0)
Monocytes Relative: 7 %
Neutro Abs: 4 10*3/uL (ref 1.7–7.7)
Neutrophils Relative %: 71 %
Platelet Count: 159 10*3/uL (ref 150–400)
RBC: 3.63 MIL/uL — ABNORMAL LOW (ref 4.22–5.81)
RDW: 12.4 % (ref 11.5–15.5)
WBC Count: 5.6 10*3/uL (ref 4.0–10.5)
nRBC: 0 % (ref 0.0–0.2)

## 2021-12-23 LAB — CMP (CANCER CENTER ONLY)
ALT: 27 U/L (ref 0–44)
AST: 25 U/L (ref 15–41)
Albumin: 3.7 g/dL (ref 3.5–5.0)
Alkaline Phosphatase: 59 U/L (ref 38–126)
Anion gap: 7 (ref 5–15)
BUN: 24 mg/dL — ABNORMAL HIGH (ref 8–23)
CO2: 31 mmol/L (ref 22–32)
Calcium: 9.1 mg/dL (ref 8.9–10.3)
Chloride: 104 mmol/L (ref 98–111)
Creatinine: 1.5 mg/dL — ABNORMAL HIGH (ref 0.61–1.24)
GFR, Estimated: 50 mL/min — ABNORMAL LOW (ref >60)
Glucose, Bld: 154 mg/dL — ABNORMAL HIGH (ref 70–99)
Potassium: 4.1 mmol/L (ref 3.5–5.1)
Sodium: 142 mmol/L (ref 135–145)
Total Bilirubin: 0.8 mg/dL (ref 0.3–1.2)
Total Protein: 7.8 g/dL (ref 6.5–8.1)

## 2021-12-23 LAB — LACTATE DEHYDROGENASE: LDH: 154 U/L (ref 98–192)

## 2021-12-23 NOTE — Progress Notes (Signed)
?Hematology and Oncology Follow Up Visit ? ?Earl Calderon ?[id number] ?[birth year] 71 y.o. ?12/23/2021 ? ? ?Principle Diagnosis:  ?IgG kappa MGUS ? ?Current Therapy:   ?Observation ?    ?Interim History:  Earl Calderon is back for second office visit.  First saw him back in October last year.  At that time, he was referred because of a lymph node in the right neck.  We did do a CT of his neck.  This showed a 18 mm lipoma. ? ?We did do a CT of his body.  There is no evidence of lymphadenopathy.  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  There is nothing that looked suspicious for cancer. ? ?His protein studies showed an M spike of 1.5 g/dL.  His IgG level was 2341 mg/dL.  His Kappa light chain was 10.1 mg/dL. ? ?Again, I am not sure that he qualifies for myeloma.  I do think that we are probably going to have to see about a bone marrow test on him. ? ?He does have diabetes.  He said he passed out this past Friday his blood sugar was low. ? ?There is been no fever.  He has had no problem with infections.  He has had no nausea or vomiting.  He says he is eating okay. ? ?There is been no problems with pain. ? ?Overall, I would have said that his performance status is probably ECOG 1. ? ?Medications:  ?Current Outpatient Medications:  ?  APPLE CIDER VINEGAR PO, Take 1 tablet by mouth daily at 6 (six) AM., Disp: , Rfl:  ?  ASPIRIN LOW DOSE 81 MG EC tablet, TAKE 1 TABLET BY MOUTH ONCE DAILY (SWALLOW WHOLE), Disp: 90 tablet, Rfl: 3 ?  ELDERBERRY PO, Take 100 mg by mouth daily., Disp: , Rfl:  ?  empagliflozin (JARDIANCE) 10 MG TABS tablet, Take 1 tablet (10 mg total) by mouth daily before breakfast., Disp: 90 tablet, Rfl: 3 ?  ENTRESTO 97-103 MG, TAKE 1 TABLET BY MOUTH TWICE DAILY, Disp: 180 tablet, Rfl: 3 ?  ferrous sulfate 325 (65 FE) MG tablet, Take 1 tablet (325 mg total) by mouth daily with breakfast., Disp: 90 tablet, Rfl: 3 ?  furosemide (LASIX) 40 MG tablet, TAKE 1 TABLET BY MOUTH ONCE DAILY, Disp: 90 tablet, Rfl:  3 ?  glucose blood (FREESTYLE TEST STRIPS) test strip, Use as instructed, Disp: 100 each, Rfl: 12 ?  insulin aspart (NOVOLOG FLEXPEN) 100 UNIT/ML FlexPen, Before each meal 3 times a day, 140-199 - 2 units, 200-250 - 4 units, 251-299 - 6 units,  300-349 - 8 units,  350 or above 10 units. Insulin PEN if approved, provide syringes and needles if needed., Disp: 15 mL, Rfl: 2 ?  Insulin Pen Needle (NOVOFINE PEN NEEDLE) 32G X 6 MM MISC, USE AS DIRECTED, Disp: 100 each, Rfl: 6 ?  LEVEMIR FLEXTOUCH 100 UNIT/ML FlexPen, Inject 8 Units into the skin 2 (two) times daily., Disp: 15 mL, Rfl: 1 ?  Probiotic Product (PROBIOTIC MULTI-ENZYME) TABS, Take 3 tablets by mouth daily at 6 (six) AM., Disp: , Rfl:  ?  Turmeric 500 MG TABS, Take 2 tablets by mouth daily at 6 (six) AM., Disp: , Rfl:  ?  carvedilol (COREG) 12.5 MG tablet, Take 1 tablet (12.5 mg total) by mouth 2 (two) times daily with a meal., Disp: 180 tablet, Rfl: 3 ?  hydrALAZINE (APRESOLINE) 25 MG tablet, Take 1 tablet (25 mg total) by mouth in the morning and at bedtime., Disp:  180 tablet, Rfl: 3 ?  nitroGLYCERIN (NITROSTAT) 0.4 MG SL tablet, Place 1 tablet (0.4 mg total) under the tongue every 5 (five) minutes as needed for chest pain., Disp: 25 tablet, Rfl: 3 ?  rosuvastatin (CRESTOR) 40 MG tablet, Take 1 tablet (40 mg total) by mouth daily at 6 PM., Disp: 90 tablet, Rfl: 3 ?  spironolactone (ALDACTONE) 25 MG tablet, Take 1 tablet (25 mg total) by mouth daily., Disp: 90 tablet, Rfl: 3 ? ?Allergies: No Known Allergies ? ?Past Medical History, Surgical history, Social history, and Family History were reviewed and updated. ? ?Review of Systems: ?Review of Systems  ?Constitutional:  Positive for fatigue.  ?HENT:  Negative.    ?Eyes: Negative.   ?Respiratory: Negative.    ?Cardiovascular: Negative.   ?Gastrointestinal: Negative.   ?Endocrine: Negative.   ?Genitourinary: Negative.    ?Musculoskeletal:  Positive for arthralgias and neck stiffness.  ?Skin: Negative.    ?Neurological:  Positive for light-headedness.  ?Hematological: Negative.   ?Psychiatric/Behavioral: Negative.    ? ?Physical Exam: ? weight is 147 lb 6.4 oz (66.9 kg). His oral temperature is 97.5 ?F (36.4 ?C) (abnormal). His blood pressure is 125/72 and his pulse is 57 (abnormal). His respiration is 20 and oxygen saturation is 99%.  ? ?Wt Readings from Last 3 Encounters:  ?12/23/21 147 lb 6.4 oz (66.9 kg)  ?10/07/21 151 lb (68.5 kg)  ?08/05/21 145 lb 6.4 oz (66 kg)  ? ? ?Physical Exam ?Vitals reviewed.  ?HENT:  ?   Head: Normocephalic and atraumatic.  ?Eyes:  ?   Pupils: Pupils are equal, round, and reactive to light.  ?Cardiovascular:  ?   Rate and Rhythm: Normal rate and regular rhythm.  ?   Heart sounds: Normal heart sounds.  ?Pulmonary:  ?   Effort: Pulmonary effort is normal.  ?   Breath sounds: Normal breath sounds.  ?Abdominal:  ?   General: Bowel sounds are normal.  ?   Palpations: Abdomen is soft.  ?Musculoskeletal:     ?   General: No tenderness or deformity. Normal range of motion.  ?   Cervical back: Normal range of motion.  ?Lymphadenopathy:  ?   Cervical: No cervical adenopathy.  ?Skin: ?   General: Skin is warm and dry.  ?   Findings: No erythema or rash.  ?Neurological:  ?   Mental Status: He is alert and oriented to person, place, and time.  ?Psychiatric:     ?   Behavior: Behavior normal.     ?   Thought Content: Thought content normal.     ?   Judgment: Judgment normal.  ? ? ? ?Lab Results  ?Component Value Date  ? WBC 5.6 12/23/2021  ? HGB 11.6 (L) 12/23/2021  ? HCT 34.8 (L) 12/23/2021  ? MCV 95.9 12/23/2021  ? PLT 159 12/23/2021  ? ?  Chemistry   ?   ?Component Value Date/Time  ? NA 142 12/23/2021 1345  ? NA 148 (H) 11/25/2021 1521  ? K 4.1 12/23/2021 1345  ? CL 104 12/23/2021 1345  ? CO2 31 12/23/2021 1345  ? BUN 24 (H) 12/23/2021 1345  ? BUN 19 11/25/2021 1521  ? CREATININE 1.50 (H) 12/23/2021 1345  ?    ?Component Value Date/Time  ? CALCIUM 9.1 12/23/2021 1345  ? ALKPHOS 59 12/23/2021 1345   ? AST 25 12/23/2021 1345  ? ALT 27 12/23/2021 1345  ? BILITOT 0.8 12/23/2021 1345  ?  ? ? ?Impression and Plan: ?Earl Calderon  is a very nice 71 year old African-American male.  He has an IgG kappa MGUS.  I do not think that this will pan out to be myeloma.  However, a bone marrow biopsy certainly would be helpful.  I will see about getting 1 set up in a few weeks. ? ?I also think that a bone survey would not be a bad idea for him.  We will try to get this downstairs in our building. ? ?I forgot to mention that he did have a 24-hour urine done we last saw.  This did show IgG kappa light chain in the urine.  He did have about 100 mg/L of Kappa light chain excretion. ? ?Again, I just do not think that he will have myeloma.  I suppose he may have smoldering myeloma.  Even if he had that, I probably would not treat him unless he had high risk cytogenetics. ? ?We will plan to get him back in about 6 weeks or so.  We will see about getting the bone marrow test before [facility] Day in May. ? ? ?Volanda Napoleon, MD ?4/24/20233:24 PM  ?

## 2021-12-24 LAB — IMMUNOFIXATION ELECTROPHORESIS
IgA: 178 mg/dL (ref 61–437)
IgG (Immunoglobin G), Serum: 2000 mg/dL — ABNORMAL HIGH (ref 603–1613)
IgM (Immunoglobulin M), Srm: 53 mg/dL (ref 20–172)
Total Protein ELP: 7.5 g/dL (ref 6.0–8.5)

## 2021-12-24 LAB — KAPPA/LAMBDA LIGHT CHAINS
Kappa free light chain: 127.9 mg/L — ABNORMAL HIGH (ref 3.3–19.4)
Kappa, lambda light chain ratio: 5.59 — ABNORMAL HIGH (ref 0.26–1.65)
Lambda free light chains: 22.9 mg/L (ref 5.7–26.3)

## 2021-12-25 ENCOUNTER — Encounter: Payer: Self-pay | Admitting: *Deleted

## 2021-12-25 LAB — PROTEIN ELECTROPHORESIS, SERUM
A/G Ratio: 1.1 (ref 0.7–1.7)
Albumin ELP: 3.9 g/dL (ref 2.9–4.4)
Alpha-1-Globulin: 0.2 g/dL (ref 0.0–0.4)
Alpha-2-Globulin: 0.6 g/dL (ref 0.4–1.0)
Beta Globulin: 0.9 g/dL (ref 0.7–1.3)
Gamma Globulin: 2 g/dL — ABNORMAL HIGH (ref 0.4–1.8)
Globulin, Total: 3.7 g/dL (ref 2.2–3.9)
M-Spike, %: 1.4 g/dL — ABNORMAL HIGH
Total Protein ELP: 7.6 g/dL (ref 6.0–8.5)

## 2022-01-10 NOTE — Progress Notes (Signed)
?[facility] Note ? ?01/13/2022 ? ?  ? ?CHIEF COMPLAINT ?Patient presents for Retina Follow Up ? ?HISTORY OF PRESENT ILLNESS: ?Earl Calderon is a 71 y.o. male who presents to the clinic today for:  ? ?HPI   ? ? Retina Follow Up   ?Patient presents with  Diabetic Retinopathy.  In right eye.  This started 6 weeks ago.  I, the attending physician,  performed the HPI with the patient and updated documentation appropriately. ? ?  ?  ? ? Comments   ?Patient here for 6 weeks retina follow up for PDR OU. Patient states vision still blurry. Eyes watery. No eye pain.  ? ?  ?  ?Last edited by [name], MD on 01/13/2022  1:54 PM.  ?  ?Pt states at times his vision seems like it's getting better, but other times it seems the same ? ? ?Referring physician: ?[name], MD ?[address]  ?[address] ?[place],  [place] [address] ? ?HISTORICAL INFORMATION:  ? ?Selected notes from the [facility] ?Referred by Dr. [name] for severe NPDR OU  ? ?CURRENT MEDICATIONS: ?No current outpatient medications on file. (Ophthalmic Drugs)  ? ?No current facility-administered medications for this visit. (Ophthalmic Drugs)  ? ?Current Outpatient Medications (Other)  ?Medication Sig  ? APPLE CIDER VINEGAR PO Take 1 tablet by mouth daily at 6 (six) AM.  ? ASPIRIN LOW DOSE 81 MG EC tablet TAKE 1 TABLET BY MOUTH ONCE DAILY (SWALLOW WHOLE)  ? ELDERBERRY PO Take 100 mg by mouth daily.  ? empagliflozin (JARDIANCE) 10 MG TABS tablet Take 1 tablet (10 mg total) by mouth daily before breakfast.  ? ENTRESTO 97-103 MG TAKE 1 TABLET BY MOUTH TWICE DAILY  ? ferrous sulfate 325 (65 FE) MG tablet Take 1 tablet (325 mg total) by mouth daily with breakfast.  ? furosemide (LASIX) 40 MG tablet TAKE 1 TABLET BY MOUTH ONCE DAILY  ? glucose blood (FREESTYLE TEST STRIPS) test strip Use as instructed  ? insulin aspart (NOVOLOG FLEXPEN) 100 UNIT/ML FlexPen Before each meal 3 times a day, 140-199 - 2 units, 200-250 -  4 units, 251-299 - 6 units,  300-349 - 8 units,  350 or above 10 units. Insulin PEN if approved, provide syringes and needles if needed.  ? Insulin Pen Needle (NOVOFINE PEN NEEDLE) 32G X 6 MM MISC USE AS DIRECTED  ? LEVEMIR FLEXTOUCH 100 UNIT/ML FlexPen Inject 8 Units into the skin 2 (two) times daily.  ? Probiotic Product (PROBIOTIC MULTI-ENZYME) TABS Take 3 tablets by mouth daily at 6 (six) AM.  ? Turmeric 500 MG TABS Take 2 tablets by mouth daily at 6 (six) AM.  ? carvedilol (COREG) 12.5 MG tablet Take 1 tablet (12.5 mg total) by mouth 2 (two) times daily with a meal.  ? hydrALAZINE (APRESOLINE) 25 MG tablet Take 1 tablet (25 mg total) by mouth in the morning and at bedtime.  ? nitroGLYCERIN (NITROSTAT) 0.4 MG SL tablet Place 1 tablet (0.4 mg total) under the tongue every 5 (five) minutes as needed for chest pain.  ? rosuvastatin (CRESTOR) 40 MG tablet Take 1 tablet (40 mg total) by mouth daily at 6 PM.  ? spironolactone (ALDACTONE) 25 MG tablet Take 1 tablet (25 mg total) by mouth daily.  ? ?No current facility-administered medications for this visit. (Other)  ? ?REVIEW OF SYSTEMS: ?ROS   ?Positive for: Endocrine, Cardiovascular, Eyes ?Negative for: Constitutional, Gastrointestinal, Neurological, Skin, Genitourinary, Musculoskeletal, HENT,  Respiratory, Psychiatric, Allergic/Imm, Heme/Lymph ?Last edited by Theodore Demark, COA on 01/13/2022 12:48 PM.  ?  ? ? ?ALLERGIES ?No Known Allergies ? ?PAST MEDICAL HISTORY ?Past Medical History:  ?Diagnosis Date  ? CAD in native artery 01/01/2021  ? Prior LAD PCI.  Currently no symptoms of ischemia.  Encouraged him to do some light exercising.  Continue aspirin, carvedilol, and rosuvastatin.  ? Cataract   ? CKD (chronic kidney disease), stage III ([facility]) 08/24/2020  ? Coronary artery disease   ? Diabetes mellitus without complication ([place])   ? on meds  ? Diabetic retinopathy ([place])   ? Goals of care, counseling/discussion 07/01/2021  ? Hernia, inguinal   ? Hypertension   ? on  meds  ? Hypertensive retinopathy   ? Lymphadenopathy, cervical 07/01/2021  ? MGUS (monoclonal gammopathy of unknown significance) 07/01/2021  ? NSVT (nonsustained ventricular tachycardia) ([place]) 08/24/2020  ? Pure hypercholesterolemia 08/05/2021  ? Uncontrolled type 2 diabetes mellitus 11/11/2018  ? ?Past Surgical History:  ?Procedure Laterality Date  ? CORONARY STENT INTERVENTION N/A 11/15/2018  ? Procedure: CORONARY STENT INTERVENTION;  Surgeon: [name], MD;  Location: [facility];  Service: Cardiovascular;  Laterality: N/A;  ? HEMORROIDECTOMY    ? RIGHT HEART CATH N/A 07/18/2020  ? Procedure: RIGHT HEART CATH;  Surgeon: [name], MD;  Location: [facility];  Service: Cardiovascular;  Laterality: N/A;  ? RIGHT/LEFT HEART CATH AND CORONARY ANGIOGRAPHY N/A 11/15/2018  ? Procedure: RIGHT/LEFT HEART CATH AND CORONARY ANGIOGRAPHY;  Surgeon: [name], MD;  Location: [facility];  Service: Cardiovascular;  Laterality: N/A;  ? WISDOM TOOTH EXTRACTION    ? ?FAMILY HISTORY ?Family History  ?Problem Relation Age of Onset  ? Hypertension Mother   ? Diabetes Mellitus II Mother   ? Arrhythmia Mother   ? Heart disease Mother   ? Hypertension Sister   ? Hypertension Brother   ? Heart disease Brother   ? Kidney disease Brother   ? Hypertension Other   ? Diabetes Mellitus II Other   ?     All 9 sibblings have HTN  ? Colon cancer Neg Hx   ? Colon polyps Neg Hx   ? Esophageal cancer Neg Hx   ? Stomach cancer Neg Hx   ? Rectal cancer Neg Hx   ? ?SOCIAL HISTORY ?Social History  ? ?Tobacco Use  ? Smoking status: Never  ? Smokeless tobacco: Never  ?Vaping Use  ? Vaping Use: Never used  ?Substance Use Topics  ? Alcohol use: Not Currently  ? Drug use: Never  ?  ? ?  ?OPHTHALMIC EXAM: ? ?Base Eye Exam   ? ? Visual Acuity (Snellen - Linear)   ? ?   Right Left  ? Dist [place] 20/40 20/60 -2  ? Dist ph [place] 20/30 -1 20/50 -1  ? ?  ?  ? ? Tonometry (Tonopen, 12:45 PM)   ? ?   Right Left  ? Pressure  15 13  ? ?  ?  ? ? Pupils   ? ?   Dark Light Shape React APD  ? Right 3 2 Round Brisk None  ? Left       ? ?  ?  ? ? Visual Fields (Counting fingers)   ? ?   Left Right  ? Restrictions Partial outer superior nasal deficiency Partial outer superior temporal deficiency  ? ?  ?  ? ? Extraocular Movement   ? ?  Right Left  ?  Full, Ortho Full, Ortho  ? ?  ?  ? ? Neuro/Psych   ? ? Oriented x3: Yes  ? Mood/Affect: Normal  ? ?  ?  ? ? Dilation   ? ? Both eyes: 1.0% Mydriacyl, 2.5% Phenylephrine @ 12:45 PM  ? ?  ?  ? ?  ? ?Slit Lamp and Fundus Exam   ? ? Slit Lamp Exam   ? ?   Right Left  ? Lids/Lashes Dermatochalasis - upper lid Dermatochalasis - upper lid  ? Conjunctiva/Sclera Melanosis Melanosis  ? Cornea arcus arcus  ? Anterior Chamber deep and clear deep and clear  ? Iris Round and poorly dilated, No NVI Round and dilated, No NVI  ? Lens 2-3+ Nuclear sclerosis, 2-3+ Cortical cataract 2-3+ Nuclear sclerosis, 2-3+ Cortical cataract  ? Anterior Vitreous Vitreous syneresis Vitreous syneresis  ? ?  ?  ? ? Fundus Exam   ? ?   Right Left  ? Disc Pink and Sharp, no NVD Pink and Sharp, no NVD  ? C/D Ratio 0.5 0.4  ? Macula Blunted foveal reflex, central edema -- slightly increased, scattered MA/DBH - improved, focal exudates temporal macula - improved good foveal reflex, scattered MA/DBH, +edema temporal macual -- slightly improved, +exudates and CWS -- improved  ? Vessels attenuated, tortuous, focal NV IN arcades--regressing attenuated, Tortuous  ? Periphery Attached, 360 MA/DBH greatest posteriorly, light 360 PRP changes Attached, 360 DBH  ? ?  ?  ? ?  ? ?IMAGING AND PROCEDURES  ?Imaging and Procedures for 01/13/2022 ? ?OCT, Retina - OU - Both Eyes   ? ?   ?Right Eye ?Quality was good. Central Foveal Thickness: 338. Progression has worsened. Findings include abnormal foveal contour, intraretinal fluid, intraretinal hyper-reflective material, vitreomacular adhesion , no SRF (Mild interval increase in IRF/edema greatest nasal  macula and fovea).  ? ?Left Eye ?Quality was good. Central Foveal Thickness: 201. Progression has improved. Findings include intraretinal fluid, no SRF, intraretinal hyper-reflective material, vitreom

## 2022-01-13 ENCOUNTER — Ambulatory Visit (INDEPENDENT_AMBULATORY_CARE_PROVIDER_SITE_OTHER): Payer: Medicare Other | Admitting: Ophthalmology

## 2022-01-13 ENCOUNTER — Encounter (INDEPENDENT_AMBULATORY_CARE_PROVIDER_SITE_OTHER): Payer: Self-pay | Admitting: Ophthalmology

## 2022-01-13 DIAGNOSIS — E113511 Type 2 diabetes mellitus with proliferative diabetic retinopathy with macular edema, right eye: Secondary | ICD-10-CM

## 2022-01-13 DIAGNOSIS — I10 Essential (primary) hypertension: Secondary | ICD-10-CM

## 2022-01-13 DIAGNOSIS — H35033 Hypertensive retinopathy, bilateral: Secondary | ICD-10-CM | POA: Diagnosis not present

## 2022-01-13 DIAGNOSIS — E113412 Type 2 diabetes mellitus with severe nonproliferative diabetic retinopathy with macular edema, left eye: Secondary | ICD-10-CM | POA: Diagnosis not present

## 2022-01-13 DIAGNOSIS — H25813 Combined forms of age-related cataract, bilateral: Secondary | ICD-10-CM

## 2022-01-13 MED ORDER — BEVACIZUMAB CHEMO INJECTION 1.25MG/0.05ML SYRINGE FOR KALEIDOSCOPE
1.2500 mg | INTRAVITREAL | Status: AC | PRN
  Administered 2022-01-13: 1.25 mg via INTRAVITREAL

## 2022-01-20 ENCOUNTER — Other Ambulatory Visit: Payer: Self-pay | Admitting: Cardiovascular Disease

## 2022-01-20 ENCOUNTER — Other Ambulatory Visit: Payer: Self-pay | Admitting: Family Medicine

## 2022-01-20 NOTE — Telephone Encounter (Signed)
Rx(s) sent to pharmacy electronically.  

## 2022-01-21 ENCOUNTER — Other Ambulatory Visit: Payer: Self-pay | Admitting: Radiology

## 2022-01-21 DIAGNOSIS — D472 Monoclonal gammopathy: Secondary | ICD-10-CM

## 2022-01-23 ENCOUNTER — Ambulatory Visit (HOSPITAL_COMMUNITY)
Admission: RE | Admit: 2022-01-23 | Discharge: 2022-01-23 | Disposition: A | Discharge status: Home / Self Care | Payer: Medicare Other | Source: Ambulatory Visit | Attending: Hematology & Oncology | Admitting: Hematology & Oncology

## 2022-01-23 ENCOUNTER — Encounter (HOSPITAL_COMMUNITY): Payer: Self-pay

## 2022-01-23 ENCOUNTER — Other Ambulatory Visit: Payer: Self-pay

## 2022-01-23 ENCOUNTER — Telehealth (HOSPITAL_BASED_OUTPATIENT_CLINIC_OR_DEPARTMENT_OTHER): Payer: Self-pay | Admitting: *Deleted

## 2022-01-23 DIAGNOSIS — E11319 Type 2 diabetes mellitus with unspecified diabetic retinopathy without macular edema: Secondary | ICD-10-CM | POA: Diagnosis not present

## 2022-01-23 DIAGNOSIS — Z955 Presence of coronary angioplasty implant and graft: Secondary | ICD-10-CM | POA: Diagnosis not present

## 2022-01-23 DIAGNOSIS — H269 Unspecified cataract: Secondary | ICD-10-CM | POA: Insufficient documentation

## 2022-01-23 DIAGNOSIS — E7800 Pure hypercholesterolemia, unspecified: Secondary | ICD-10-CM | POA: Diagnosis not present

## 2022-01-23 DIAGNOSIS — N183 Chronic kidney disease, stage 3 unspecified: Secondary | ICD-10-CM | POA: Insufficient documentation

## 2022-01-23 DIAGNOSIS — Q998 Other specified chromosome abnormalities: Secondary | ICD-10-CM | POA: Diagnosis not present

## 2022-01-23 DIAGNOSIS — Z8249 Family history of ischemic heart disease and other diseases of the circulatory system: Secondary | ICD-10-CM | POA: Diagnosis not present

## 2022-01-23 DIAGNOSIS — Z794 Long term (current) use of insulin: Secondary | ICD-10-CM | POA: Insufficient documentation

## 2022-01-23 DIAGNOSIS — Z7984 Long term (current) use of oral hypoglycemic drugs: Secondary | ICD-10-CM | POA: Diagnosis not present

## 2022-01-23 DIAGNOSIS — I129 Hypertensive chronic kidney disease with stage 1 through stage 4 chronic kidney disease, or unspecified chronic kidney disease: Secondary | ICD-10-CM | POA: Insufficient documentation

## 2022-01-23 DIAGNOSIS — R8279 Other abnormal findings on microbiological examination of urine: Secondary | ICD-10-CM | POA: Diagnosis not present

## 2022-01-23 DIAGNOSIS — E1136 Type 2 diabetes mellitus with diabetic cataract: Secondary | ICD-10-CM | POA: Insufficient documentation

## 2022-01-23 DIAGNOSIS — C9000 Multiple myeloma not having achieved remission: Secondary | ICD-10-CM | POA: Diagnosis not present

## 2022-01-23 DIAGNOSIS — Z833 Family history of diabetes mellitus: Secondary | ICD-10-CM | POA: Insufficient documentation

## 2022-01-23 DIAGNOSIS — I2510 Atherosclerotic heart disease of native coronary artery without angina pectoris: Secondary | ICD-10-CM | POA: Insufficient documentation

## 2022-01-23 DIAGNOSIS — D472 Monoclonal gammopathy: Secondary | ICD-10-CM | POA: Diagnosis present

## 2022-01-23 DIAGNOSIS — H35039 Hypertensive retinopathy, unspecified eye: Secondary | ICD-10-CM | POA: Diagnosis not present

## 2022-01-23 DIAGNOSIS — Z01812 Encounter for preprocedural laboratory examination: Secondary | ICD-10-CM | POA: Insufficient documentation

## 2022-01-23 DIAGNOSIS — E1122 Type 2 diabetes mellitus with diabetic chronic kidney disease: Secondary | ICD-10-CM | POA: Insufficient documentation

## 2022-01-23 DIAGNOSIS — D649 Anemia, unspecified: Secondary | ICD-10-CM | POA: Diagnosis not present

## 2022-01-23 LAB — CBC WITH DIFFERENTIAL/PLATELET
Abs Immature Granulocytes: 0.02 10*3/uL (ref 0.00–0.07)
Basophils Absolute: 0 10*3/uL (ref 0.0–0.1)
Basophils Relative: 0 %
Eosinophils Absolute: 0.2 10*3/uL (ref 0.0–0.5)
Eosinophils Relative: 2 %
HCT: 35.4 % — ABNORMAL LOW (ref 39.0–52.0)
Hemoglobin: 11.8 g/dL — ABNORMAL LOW (ref 13.0–17.0)
Immature Granulocytes: 0 %
Lymphocytes Relative: 18 %
Lymphs Abs: 1.3 10*3/uL (ref 0.7–4.0)
MCH: 31.9 pg (ref 26.0–34.0)
MCHC: 33.3 g/dL (ref 30.0–36.0)
MCV: 95.7 fL (ref 80.0–100.0)
Monocytes Absolute: 0.6 10*3/uL (ref 0.1–1.0)
Monocytes Relative: 8 %
Neutro Abs: 5 10*3/uL (ref 1.7–7.7)
Neutrophils Relative %: 72 %
Platelets: 170 10*3/uL (ref 150–400)
RBC: 3.7 MIL/uL — ABNORMAL LOW (ref 4.22–5.81)
RDW: 12.1 % (ref 11.5–15.5)
WBC: 7 10*3/uL (ref 4.0–10.5)
nRBC: 0 % (ref 0.0–0.2)

## 2022-01-23 LAB — GLUCOSE, CAPILLARY: Glucose-Capillary: 173 mg/dL — ABNORMAL HIGH (ref 70–99)

## 2022-01-23 MED ORDER — LIDOCAINE HCL (PF) 1 % IJ SOLN
INTRAMUSCULAR | Status: AC | PRN
  Administered 2022-01-23: 20 mL

## 2022-01-23 MED ORDER — FENTANYL CITRATE (PF) 100 MCG/2ML IJ SOLN
INTRAMUSCULAR | Status: AC
  Filled 2022-01-23: qty 2

## 2022-01-23 MED ORDER — FENTANYL CITRATE (PF) 100 MCG/2ML IJ SOLN
INTRAMUSCULAR | Status: AC | PRN
  Administered 2022-01-23: 50 ug via INTRAVENOUS

## 2022-01-23 MED ORDER — SODIUM CHLORIDE 0.9 % IV SOLN: INTRAVENOUS | Status: DC

## 2022-01-23 NOTE — Discharge Instructions (Signed)

## 2022-01-23 NOTE — Procedures (Signed)
Vascular and Interventional Radiology Procedure Note  Patient: Earl Calderon DOB: 1951/01/08 Medical Record Number: [id number] Note Date/Time: 01/23/22 1:00 PM   Performing Physician: Michaelle Birks, MD Assistant(s): None  Diagnosis: MGUS  Procedure: BONE MARROW ASPIRATION and BIOPSY  Anesthesia:  Single agent sedation. Complications: None Estimated Blood Loss: Minimal Specimens: Sent for Pathology  Findings:  Successful CT-guided bone marrow aspiration and biopsy A total of 1 cores were obtained. Hemostasis of the tract was achieved using Manual Pressure.  Plan: Bed rest for 1 hours.  See detailed procedure note with images in PACS. The patient tolerated the procedure well without incident or complication and was returned to Recovery in stable condition.    Michaelle Birks, MD [facility]   Pager. [phone]

## 2022-01-23 NOTE — Progress Notes (Signed)
Radiology PA made aware that patient consumed boiled egg, grapes, orange, black coffee this morning.

## 2022-01-23 NOTE — Telephone Encounter (Signed)
Received fax from pharmacy for refills on pt Levemir flextouch 100 unit, CMA call pharmacy to let them know we do not fill Levemir and this medication need to go to pt PCP, pharmacist voice understanding and thanks.Marland Kitchen

## 2022-01-23 NOTE — Consult Note (Signed)
Chief Complaint: Patient was seen in consultation today for CT-guided bone marrow biopsy  Referring Physician(s): Ennever,Peter R  Supervising Physician: Michaelle Birks  Patient Status: [facility] - Out-pt  History of Present Illness: Earl Calderon is a 71 y.o. male with past medical history of coronary artery disease with stenting, chronic kidney disease, diabetes, hypertension, hypercholesterolemia, and MGUS with positive M spike on SPEP and IgG kappa light chains in the urine.  He presents today for CT-guided bone marrow biopsy for further evaluation/rule out myeloma.  Past Medical History:  Diagnosis Date   CAD in native artery 01/01/2021   Prior LAD PCI.  Currently no symptoms of ischemia.  Encouraged him to do some light exercising.  Continue aspirin, carvedilol, and rosuvastatin.   Cataract    CKD (chronic kidney disease), stage III ([place]) 08/24/2020   Coronary artery disease    Diabetes mellitus without complication ([place])    on meds   Diabetic retinopathy ([place])    Goals of care, counseling/discussion 07/01/2021   Hernia, inguinal    Hypertension    on meds   Hypertensive retinopathy    Lymphadenopathy, cervical 07/01/2021   MGUS (monoclonal gammopathy of unknown significance) 07/01/2021   NSVT (nonsustained ventricular tachycardia) ([place]) 08/24/2020   Pure hypercholesterolemia 08/05/2021   Uncontrolled type 2 diabetes mellitus 11/11/2018    Past Surgical History:  Procedure Laterality Date   CORONARY STENT INTERVENTION N/A 11/15/2018   Procedure: CORONARY STENT INTERVENTION;  Surgeon: [name], MD;  Location: [facility];  Service: Cardiovascular;  Laterality: N/A;   HEMORROIDECTOMY     RIGHT HEART CATH N/A 07/18/2020   Procedure: RIGHT HEART CATH;  Surgeon: [name], MD;  Location: [facility];  Service: Cardiovascular;  Laterality: N/A;   RIGHT/LEFT HEART CATH AND CORONARY ANGIOGRAPHY N/A 11/15/2018   Procedure: RIGHT/LEFT HEART  CATH AND CORONARY ANGIOGRAPHY;  Surgeon: [name], MD;  Location: [facility];  Service: Cardiovascular;  Laterality: N/A;   WISDOM TOOTH EXTRACTION      Allergies: Patient has no known allergies.  Medications: Prior to Admission medications   Medication Sig Start Date End Date Taking? Authorizing Provider  APPLE CIDER VINEGAR PO Take 1 tablet by mouth daily at 6 (six) AM.   Yes [provider]  ASPIRIN LOW DOSE 81 MG EC tablet TAKE 1 TABLET BY MOUTH ONCE DAILY (SWALLOW WHOLE) 10/10/21  Yes Loel Dubonnet, NP  carvedilol (COREG) 12.5 MG tablet Take 1 tablet (12.5 mg total) by mouth 2 (two) times daily with a meal. 05/03/21 01/23/22 Yes [name], MD  ELDERBERRY PO Take 100 mg by mouth daily.   Yes [provider]  empagliflozin (JARDIANCE) 10 MG TABS tablet Take 1 tablet (10 mg total) by mouth daily before breakfast. 05/03/21  Yes [name], MD  ENTRESTO 97-103 MG TAKE 1 TABLET BY MOUTH TWICE DAILY 10/29/21  Yes [name], MD  ferrous sulfate 325 (65 FE) MG tablet Take 1 tablet (325 mg total) by mouth daily with breakfast. 11/11/21  Yes [name], MD  furosemide (LASIX) 40 MG tablet TAKE 1 TABLET BY MOUTH ONCE DAILY 10/10/21  Yes Loel Dubonnet, NP  glucose blood (FREESTYLE TEST STRIPS) test strip Use as instructed 10/21/20  Yes Kerin Perna, NP  hydrALAZINE (APRESOLINE) 25 MG tablet Take 1 tablet (25 mg total) by mouth in the morning and at bedtime. 08/05/21 01/23/22 Yes [name], MD  insulin aspart (NOVOLOG FLEXPEN) 100 UNIT/ML FlexPen Before each  meal 3 times a day, 140-199 - 2 units, 200-250 - 4 units, 251-299 - 6 units,  300-349 - 8 units,  350 or above 10 units. Insulin PEN if approved, provide syringes and needles if needed. 09/12/20  Yes [name], MD  Insulin Pen Needle (NOVOFINE PEN NEEDLE) 32G X 6 MM MISC USE AS DIRECTED 01/11/21  Yes Gildardo Pounds, NP  LEVEMIR FLEXTOUCH 100 UNIT/ML FlexPen Inject 8  Units into the skin 2 (two) times daily. 01/02/21  Yes Charlott Rakes, MD  Probiotic Product (PROBIOTIC MULTI-ENZYME) TABS Take 3 tablets by mouth daily at 6 (six) AM.   Yes [provider]  rosuvastatin (CRESTOR) 40 MG tablet Take 1 tablet (40 mg total) by mouth daily at 6 PM. 01/09/21 01/23/22 Yes Gildardo Pounds, NP  spironolactone (ALDACTONE) 25 MG tablet TAKE 1 TABLET BY MOUTH ONCE DAILY 01/20/22  Yes [name], MD  Turmeric 500 MG TABS Take 2 tablets by mouth daily at 6 (six) AM.   Yes [provider]  nitroGLYCERIN (NITROSTAT) 0.4 MG SL tablet Place 1 tablet (0.4 mg total) under the tongue every 5 (five) minutes as needed for chest pain. 10/22/20 08/05/21  Ledora Bottcher, PA     Family History  Problem Relation Age of Onset   Hypertension Mother    Diabetes Mellitus II Mother    Arrhythmia Mother    Heart disease Mother    Hypertension Sister    Hypertension Brother    Heart disease Brother    Kidney disease Brother    Hypertension Other    Diabetes Mellitus II Other        All 9 sibblings have HTN   Colon cancer Neg Hx    Colon polyps Neg Hx    Esophageal cancer Neg Hx    Stomach cancer Neg Hx    Rectal cancer Neg Hx     Social History   Socioeconomic History   Marital status: Married    Spouse name: Not on file   Number of children: Not on file   Years of education: Not on file   Highest education level: Not on file  Occupational History   Not on file  Tobacco Use   Smoking status: Never   Smokeless tobacco: Never  Vaping Use   Vaping Use: Never used  Substance and Sexual Activity   Alcohol use: Not Currently   Drug use: Never   Sexual activity: Not Currently  Other Topics Concern   Not on file  Social History Narrative   Not on file   Social Determinants of Health   Financial Resource Strain: Not on file  Food Insecurity: Not on file  Transportation Needs: Not on file  Physical Activity: Not on file  Stress: Not on file   Social Connections: Not on file      Review of Systems currently denies fever, headache, chest pain, dyspnea, cough, abdominal/back pain, nausea, vomiting or bleeding.  Does have fatigue  Vital Signs: BP (!) 199/101   Pulse (!) 57   Temp 97.9 F (36.6 C) (Oral)   Resp 16   Ht 5' 8"  (1.727 m)   Wt 152 lb (68.9 kg)   SpO2 100%   BMI 23.11 kg/m   Physical Exam awake, alert.  Chest clear to auscultation bilaterally.  Heart with slightly bradycardic but regular rhythm ;  abdomen soft, positive bowel sounds, nontender.  No lower extremity edema.  Imaging: Intravitreal Injection, Pharmacologic Agent - OD - Right Eye  Result Date: 01/13/2022 Time Out 01/13/2022. 1:06 PM. Confirmed correct patient, procedure, site, and patient consented. Anesthesia Topical anesthesia was used. Anesthetic medications included Lidocaine 2%, Proparacaine 0.5%. Procedure Preparation included 5% betadine to ocular surface, eyelid speculum. A (32g) needle was used. Injection: 1.25 mg Bevacizumab 1.13m/0.05ml   Route: Intravitreal, Site: Right Eye   NDC: 50242-060-01, Lot:: [id number] Expiration date: 03/07/2022 Post-op Post injection exam found visual acuity of at least counting fingers. The patient tolerated the procedure well. There were no complications. The patient received written and verbal post procedure care education. Post injection medications were not given.   Intravitreal Injection, Pharmacologic Agent - OS - Left Eye  Result Date: 01/13/2022 Time Out 01/13/2022. 1:06 PM. Confirmed correct patient, procedure, site, and patient consented. Anesthesia Topical anesthesia was used. Anesthetic medications included Lidocaine 2%, Proparacaine 0.5%. Procedure Preparation included 5% betadine to ocular surface, eyelid speculum. A (32g) needle was used. Injection: 1.25 mg Bevacizumab 1.233m0.05ml   Route: Intravitreal, Site: Left Eye   NDC: [id number]: : [id number] date: 02/27/2022 Post-op Post injection exam  found visual acuity of at least counting fingers. The patient tolerated the procedure well. There were no complications. The patient received written and verbal post procedure care education. Post injection medications were not given.   OCT, Retina - OU - Both Eyes  Result Date: 01/13/2022 Right Eye Quality was good. Central Foveal Thickness: 338. Progression has worsened. Findings include abnormal foveal contour, intraretinal fluid, intraretinal hyper-reflective material, vitreomacular adhesion , no SRF (Mild interval increase in IRF/edema greatest nasal macula and fovea). Left Eye Quality was good. Central Foveal Thickness: 201. Progression has improved. Findings include intraretinal fluid, no SRF, intraretinal hyper-reflective material, vitreomacular adhesion , normal foveal contour (Mild interval improvement in IRF temporal macula, patchy ORA). Notes *Images captured and stored on drive Diagnosis / Impression: Abnormal foveal contour and +DME OU OD: Mild interval increase in IRF/edema greatest nasal macula and fovea OS: Mild interval improvement in IRF temporal macula, patchy ORA Clinical management: See below Abbreviations: NFP - Normal foveal profile. CME - cystoid macular edema. PED - pigment epithelial detachment. IRF - intraretinal fluid. SRF - subretinal fluid. EZ - ellipsoid zone. ERM - epiretinal membrane. ORA - outer retinal atrophy. ORT - outer retinal tubulation. SRHM - subretinal hyper-reflective material. IRHM - intraretinal hyper-reflective material    Labs:  CBC: Recent Labs    07/01/21 1404 12/23/21 1345 01/23/22 0930  WBC 6.0 5.6 7.0  HGB 11.4* 11.6* 11.8*  HCT 32.8* 34.8* 35.4*  PLT 146* 159 170    COAGS: No results for input(s): INR, APTT in the last 8760 hours.  BMP: Recent Labs    07/01/21 1404 11/25/21 1521 12/23/21 1345  NA 138 148* 142  K 3.8 4.4 4.1  CL 101 109* 104  CO2 32 26 31  GLUCOSE 285* 266* 154*  BUN 24* 19 24*  CALCIUM 9.4 9.3 9.1  CREATININE  1.15 1.30* 1.50*  GFRNONAA >60  --  50*    LIVER FUNCTION TESTS: Recent Labs    07/01/21 1404 12/23/21 1345  BILITOT 0.9 0.8  AST 22 25  ALT 19 27  ALKPHOS 77 59  PROT 7.9 7.8  ALBUMIN 3.9 3.7    TUMOR MARKERS: No results for input(s): AFPTM, CEA, CA199, CHROMGRNA in the last 8760 hours.  Assessment and Plan: [age].o. male with past medical history of coronary artery disease with stenting, chronic kidney disease, diabetes, hypertension, hypercholesterolemia, and MGUS with positive M spike on SPEP and IgG kappa  light chains in the urine.  He presents today for CT-guided bone marrow biopsy for further evaluation/rule out myeloma.Risks and benefits of procedure was discussed with the patient including, but not limited to bleeding, infection, damage to adjacent structures or low yield requiring additional tests.  All of the questions were answered and there is agreement to proceed.  Consent signed and in chart.  Patient ate breakfast this morning therefore will receive only local anesthetic as well as IV fentanyl.  Thank you for this interesting consult.  I greatly enjoyed meeting Rashaad Hallstrom and look forward to participating in their care.  A copy of this report was sent to the requesting provider on this date.  Electronically Signed: D. Rowe Robert, PA-C 01/23/2022, 9:44 AM   I spent a total of  20 minutes   in face to face in clinical consultation, greater than 50% of which was counseling/coordinating care for CT-guided bone marrow biopsy

## 2022-01-28 LAB — SURGICAL PATHOLOGY

## 2022-01-29 NOTE — Progress Notes (Signed)
Pre procedure

## 2022-01-30 ENCOUNTER — Encounter (HOSPITAL_COMMUNITY): Payer: Self-pay | Admitting: Hematology & Oncology

## 2022-02-03 ENCOUNTER — Encounter (HOSPITAL_BASED_OUTPATIENT_CLINIC_OR_DEPARTMENT_OTHER): Payer: Self-pay | Admitting: Cardiovascular Disease

## 2022-02-03 ENCOUNTER — Ambulatory Visit (INDEPENDENT_AMBULATORY_CARE_PROVIDER_SITE_OTHER): Payer: Medicare Other | Admitting: Cardiovascular Disease

## 2022-02-03 DIAGNOSIS — E7800 Pure hypercholesterolemia, unspecified: Secondary | ICD-10-CM

## 2022-02-03 DIAGNOSIS — I5042 Chronic combined systolic (congestive) and diastolic (congestive) heart failure: Secondary | ICD-10-CM | POA: Diagnosis not present

## 2022-02-03 DIAGNOSIS — I10 Essential (primary) hypertension: Secondary | ICD-10-CM

## 2022-02-03 NOTE — Progress Notes (Signed)
Cardiology Office Note  Date:  02/03/2022   ID:  Earl Calderon, DOB [birth year], MRN [id number]  PCP:  [name], MD  Cardiologist:   [name], MD   No chief complaint on file.    History of Present Illness: Earl Calderon is a 71 yo male with chronic systolic and diastolic heart failure, prior stroke, diabetes, hypertension, hyperlipidemia, CAD status post PCI and nonadherence admitted 07/2020 with acute on chronic systolic and diastolic heart failure in the setting of not taking his medications. He was admitted with volume overload and dyspnea.  He wanted to "remove all chemicals from his body" and decided to stop his medications. Echo revealed LVEF 30-35% with global hypokinesis and moderate LVH. BP was poorly controlled. There was concern for cardiac amyloidosis.  PYP scan during that hospitalization was not suggestive of ATTR amyloidosis.  He was started on carvedilol, Entresto, spironolactone, BiDil, and for [place].  Prior to discharge he had a right heart cath with radial pressure of 8, RV 53/3, pulmonary capillary wedge 15.  Mean PA pressure was 29 mmHg.  After discharge he was unable to get spironolactone or BiDil from the pharmacy.  Earl Calderon was first seen by cardiology 10/2018 at which time he was hospitalized and found to have a newly reduced LVEF to 25 to 30%.  EKG revealed left bundle branch block.  He underwent right and left heart cath 10/2018 and was found to have a 90% mid LAD lesion that was successfully stented with drug-eluting stent.  He did not follow-up after that time.  He was seen 07/2020 and he was not taking spirolactone or vidl, however they were not started because his blood pressure was low and he was fatigued. His Lasix was decreased given he was on farxiga. He followed up with [name] and Imdur was added. He then felt fatigued and weak and was seen on 10/2020 for chest pain.  At his initial appointment he was doing well from a cardiac  standpoint. He reported some DOE that did not concern him. He was working with [name] for diabetic retinopathy. At his last appointment, he was fatigued and mildly short of breath consistently and his eyesight was mostly unchanged. He reported slight improvement with the injection treatments. His blood pressure was low and he was not taking carvedilol, the dose was reduced to 12.47m. He saw [name] 06/2021 and his blood pressure was very elevated, though he had not taken medication that morning. He was referred for a R supraclavicular mass. He is being worked up for a monoclonal spike. He had a CT scan that showed the mass was a lipoma.   At his last appointment he was struggling with fatigue and insomnia. His at home blood pressures averaged in the 947'Qdiastolic. Hydralazine was added to his regimen. He followed up with Earl Montana NP 10/2021 and was doing well. Blood pressures were controlled at home, but elevated in the office. Hydralazine was increased. He had a bone marrow biopsy 12/2021 for MGUS and a positive M spike on SPEP.  Today, he reports feeling consistently fatigued with little to no energy. Sometimes he feels dizzy, especially while working outside. He is staying active with housework and working in the yard such as cutting grass, weed-eating. However, he continues to need breaks and would like to have more energy.  He denies any anginal symptoms. Mostly he becomes fatigued and a little short of breath. He also notes sneezing frequently and developing phlegm  which he believes is related to working outside or allergies. He also notes not having as much sensation/numbness in his feet and hands. Lately his sleeping has improved. He denies any palpitations, or peripheral edema. No headaches, syncope, orthopnea, or PND.   Past Medical History:  Diagnosis Date   CAD in native artery 01/01/2021   Prior LAD PCI.  Currently no symptoms of ischemia.  Encouraged him to do some light  exercising.  Continue aspirin, carvedilol, and rosuvastatin.   Cataract    CKD (chronic kidney disease), stage III ([place]) 08/24/2020   Coronary artery disease    Diabetes mellitus without complication ([place])    on meds   Diabetic retinopathy ([place])    Goals of care, counseling/discussion 07/01/2021   Hernia, inguinal    Hypertension    on meds   Hypertensive retinopathy    Lymphadenopathy, cervical 07/01/2021   MGUS (monoclonal gammopathy of unknown significance) 07/01/2021   NSVT (nonsustained ventricular tachycardia) ([place]) 08/24/2020   Pure hypercholesterolemia 08/05/2021   Uncontrolled type 2 diabetes mellitus 11/11/2018    Past Surgical History:  Procedure Laterality Date   CORONARY STENT INTERVENTION N/A 11/15/2018   Procedure: CORONARY STENT INTERVENTION;  Surgeon: [name], MD;  Location: [facility];  Service: Cardiovascular;  Laterality: N/A;   HEMORROIDECTOMY     RIGHT HEART CATH N/A 07/18/2020   Procedure: RIGHT HEART CATH;  Surgeon: [name], MD;  Location: [facility];  Service: Cardiovascular;  Laterality: N/A;   RIGHT/LEFT HEART CATH AND CORONARY ANGIOGRAPHY N/A 11/15/2018   Procedure: RIGHT/LEFT HEART CATH AND CORONARY ANGIOGRAPHY;  Surgeon: [name], MD;  Location: [facility];  Service: Cardiovascular;  Laterality: N/A;   WISDOM TOOTH EXTRACTION       Current Outpatient Medications  Medication Sig Dispense Refill   APPLE CIDER VINEGAR PO Take 1 tablet by mouth daily at 6 (six) AM.     ASPIRIN LOW DOSE 81 MG EC tablet TAKE 1 TABLET BY MOUTH ONCE DAILY (SWALLOW WHOLE) 90 tablet 3   ELDERBERRY PO Take 100 mg by mouth daily.     empagliflozin (JARDIANCE) 10 MG TABS tablet Take 1 tablet (10 mg total) by mouth daily before breakfast. 90 tablet 3   ENTRESTO 97-103 MG TAKE 1 TABLET BY MOUTH TWICE DAILY 180 tablet 3   ferrous sulfate 325 (65 FE) MG tablet Take 1 tablet (325 mg total) by mouth daily with breakfast. 90  tablet 3   furosemide (LASIX) 40 MG tablet TAKE 1 TABLET BY MOUTH ONCE DAILY 90 tablet 3   glucose blood (FREESTYLE TEST STRIPS) test strip Use as instructed 100 each 12   insulin aspart (NOVOLOG FLEXPEN) 100 UNIT/ML FlexPen Before each meal 3 times a day, 140-199 - 2 units, 200-250 - 4 units, 251-299 - 6 units,  300-349 - 8 units,  350 or above 10 units. Insulin PEN if approved, provide syringes and needles if needed. 15 mL 2   Insulin Pen Needle (NOVOFINE PEN NEEDLE) 32G X 6 MM MISC USE AS DIRECTED 100 each 6   LEVEMIR FLEXTOUCH 100 UNIT/ML FlexPen Inject 8 Units into the skin 2 (two) times daily. 15 mL 1   Probiotic Product (PROBIOTIC MULTI-ENZYME) TABS Take 3 tablets by mouth daily at 6 (six) AM.     spironolactone (ALDACTONE) 25 MG tablet TAKE 1 TABLET BY MOUTH ONCE DAILY 90 tablet 2   Turmeric 500 MG TABS Take 2 tablets by mouth daily at 6 (six) AM.  carvedilol (COREG) 12.5 MG tablet Take 1 tablet (12.5 mg total) by mouth 2 (two) times daily with a meal. 180 tablet 3   hydrALAZINE (APRESOLINE) 25 MG tablet Take 1 tablet (25 mg total) by mouth in the morning and at bedtime. 180 tablet 3   nitroGLYCERIN (NITROSTAT) 0.4 MG SL tablet Place 1 tablet (0.4 mg total) under the tongue every 5 (five) minutes as needed for chest pain. 25 tablet 3   rosuvastatin (CRESTOR) 40 MG tablet Take 1 tablet (40 mg total) by mouth daily at 6 PM. 90 tablet 3   No current facility-administered medications for this visit.    Allergies:   Patient has no known allergies.    Social History:  The patient  reports that he has never smoked. He has never used smokeless tobacco. He reports that he does not currently use alcohol. He reports that he does not use drugs.   Family History:  The patient's family history includes Arrhythmia in his mother; Diabetes Mellitus II in his mother and another family member; Heart disease in his brother and mother; Hypertension in his brother, mother, sister, and another family  member; Kidney disease in his brother.    ROS:   Please see the history of present illness. (+) Fatigue (+) Shortness of breath (+) Numbness of bilateral feet and hands (+) Dizziness All other systems are reviewed and negative.    PHYSICAL EXAM: VS:  BP 120/76   Pulse (!) 53   Ht 5' 8" (1.727 m)   Wt 153 lb (69.4 kg)   BMI 23.26 kg/m  , BMI Body mass index is 23.26 kg/m. GENERAL:  Well appearing.  No acute distress. HEENT:  Pupils equal round and reactive, fundi not visualized, oral mucosa unremarkable NECK:  No jugular venous distention, waveform within normal limits, carotid upstroke brisk and symmetric, no bruits LUNGS:  Clear to auscultation bilaterally HEART:  RRR.  PMI not displaced or sustained,S1 and S2 within normal limits, no S3, no S4, no clicks, no rubs, no murmurs ABD:  Flat, positive bowel sounds normal in frequency in pitch, no bruits, no rebound, no guarding, no midline pulsatile mass, no hepatomegaly, no splenomegaly EXT:  2 plus pulses throughout, no edema, no cyanosis no clubbing SKIN:  No rashes no nodules NEURO:  Cranial nerves II through XII grossly intact, motor grossly intact throughout PSYCH:  Cognitively intact, oriented to person place and time   EKG:   EKG is personally reviewed. 02/03/2022:  Sinus bradycardia. Rate 53 bpm. LBBB. 05/03/2021: Sinus bradycardia. Rate 59 bpm. LBBB.   Chest CT 07/19/21 1. No evidence for lymphadenopathy in the chest, abdomen, or pelvis. 2. 1.9 cm lesion in the lateral segment left liver shows peripheral enhancement and filling in delayed imaging. Imaging characteristics suggest cavernous hemangioma but are not entirely characteristic. MRI abdomen without and with contrast recommended to further evaluate. 3. 12 mm hypervascular lesion in the spleen cannot be definitively characterized but is likely benign and may be a hemangioma. Attention on follow-up recommended. 4. 2 mm right middle lobe and 4 mm perifissural right  lower lobe pulmonary nodules. No follow-up needed if patient is low-risk (and has no known or suspected primary neoplasm). Non-contrast chest CT can be considered in 12 months if patient is high-risk. This recommendation follows the consensus statement: Guidelines for Management of Incidental Pulmonary Nodules Detected on CT Images: From the Fleischner Society 2017; Radiology 2017; 284:228-243. 5. Small left groin hernia contains only fat. 6. Aortic Atherosclerosis (ICD10-I70.0).  Echo  05/27/21  1. Left ventricular ejection fraction, by estimation, is 40 to 45%. Left  ventricular ejection fraction by 3D volume is 44 %. The left ventricle has  mildly decreased function. The left ventricle demonstrates global  hypokinesis. There is moderate concentric left ventricular hypertrophy. Left ventricular diastolic parameters are consistent with Grade I diastolic dysfunction (impaired relaxation). Elevated left ventricular end-diastolic pressure. The average left ventricular global longitudinal strain is -15.2 %. The global longitudinal strain is abnormal.   2. Right ventricular systolic function is normal. The right ventricular  size is normal. There is normal pulmonary artery systolic pressure.   3. Left atrial size was mildly dilated.   4. A small pericardial effusion is present. The pericardial effusion is  anterior to the right ventricle.   5. The mitral valve is normal in structure. Trivial mitral valve  regurgitation. No evidence of mitral stenosis.   6. The aortic valve is normal in structure. Aortic valve regurgitation is  not visualized. No aortic stenosis is present.   7. Aortic dilatation noted. There is mild dilatation of the aortic root,  measuring 41 mm. There is mild dilatation of the ascending aorta,  measuring 38 mm.   8. The inferior vena cava is normal in size with greater than 50%  respiratory variability, suggesting right atrial pressure of 3 mmHg.  Comparison(s): 02/20/21 EF  40-45%. PA pressure 105mHg.   [place] 04/10/2021: IMPRESSION: 1. Indeterminate right supraclavicular soft tissue mass. No evidence of cervical lymphadenopathy. The mass would be amenable to percutaneous biopsy as clinically indicated. 2. Normal appearance of the thyroid gland.  Echo 02/20/2021: 1. Left ventricular ejection fraction, by estimation, is 40 to 45%. The  left ventricle has mildly decreased function. The left ventricle  demonstrates global hypokinesis. There is severe left ventricular  hypertrophy. Left ventricular diastolic parameters   are consistent with Grade II diastolic dysfunction (pseudonormalization).  Elevated left atrial pressure.   2. Right ventricular systolic function is mildly reduced. The right  ventricular size is normal. There is normal pulmonary artery systolic  pressure. The estimated right ventricular systolic pressure is 232.3mmHg.   3. Left atrial size was moderately dilated.   4. The mitral valve is normal in structure. No evidence of mitral valve  regurgitation. No evidence of mitral stenosis.   5. The aortic valve is tricuspid. Aortic valve regurgitation is not  visualized. Mild aortic valve sclerosis is present, with no evidence of  aortic valve stenosis.   6. The inferior vena cava is normal in size with <50% respiratory  variability, suggesting right atrial pressure of 8 mmHg.   Comparison(s): Compared to prior echo, LV systolic function has improved.   [place]: Mildly elevated filling pressures, mild to moderate pulmonary hypertension and mildly reduced cardiac output.   RA: 8 mmHg RV: 53/3 mmHg PCW 15 mmHg PA: 50/17 mmHg with a mean of 29   AO sat 97% PA sat 60% Cardiac output: 3.92 with a cardiac index of 2.15.  Echo 07/10/20:  1. Left ventricular ejection fraction, by estimation, is 30 to 35%. The  left ventricle has moderately decreased function. The left ventricle  demonstrates global hypokinesis. There is moderate  concentric left  ventricular hypertrophy. Left ventricular  diastolic parameters are consistent with Grade II diastolic dysfunction  (pseudonormalization). Elevated left atrial pressure.   2. Right ventricular systolic function is moderately reduced. The right  ventricular size is mildly enlarged. Mildly increased right ventricular  wall thickness. There is moderately elevated pulmonary artery systolic  pressure. The estimated right  ventricular systolic pressure is 68.3 mmHg.   3. Left atrial size was severely dilated.   4. Right atrial size was moderately dilated.   5. The mitral valve is normal in structure. Mild mitral valve  regurgitation.   6. Tricuspid valve regurgitation is moderate.   7. The aortic valve is tricuspid. Aortic valve regurgitation is not  visualized. No aortic stenosis is present.   8. The inferior vena cava is dilated in size with <50% respiratory  variability, suggesting right atrial pressure of 15 mmHg.   LHC 11/15/18:  Prox LAD to Mid LAD lesion is 40% stenosed. Mid Cx to Dist Cx lesion is 25% stenosed. Ost 2nd Diag to 2nd Diag lesion is 50% stenosed. Ost 3rd Mrg lesion is 25% stenosed. Mid LAD lesion is 90% stenosed. A drug-eluting stent was successfully placed using a STENT SYNERGY DES 2.5X16. Post intervention, there is a 0% residual stenosis. LV end diastolic pressure is normal. There is no aortic valve stenosis. Ao 92%, PA 66%, mean PA 17 mm Hg, mean PCWP 3 mm Hg, CO 5.1; CI 2.8- normal right heart pressures Tortuous right subclavian. 3DRC was used for RCA.   Continue aggressive secondary prevention in addition to dual antiplatelet therapy.  Adequate diuresis based on right heart pressures.  Continue medical therapy for heart failure.    Recent Labs: 12/23/2021: ALT 27; BUN 24; Creatinine 1.50; Potassium 4.1; Sodium 142 01/23/2022: Hemoglobin 11.8; Platelets 170    Lipid Panel    Component Value Date/Time   CHOL 103 01/16/2021 1705   TRIG 66  01/16/2021 1705   HDL 47 01/16/2021 1705   CHOLHDL 2.2 01/16/2021 1705   CHOLHDL 3.6 07/12/2020 0510   VLDL 13 07/12/2020 0510   LDLCALC 42 01/16/2021 1705      Wt Readings from Last 3 Encounters:  02/03/22 153 lb (69.4 kg)  01/23/22 152 lb (68.9 kg)  12/23/21 147 lb 6.4 oz (66.9 kg)      ASSESSMENT AND PLAN:  Chronic combined systolic and diastolic heart failure (HCC) LVEF has been stable at 40 to 45%.  He is euvolemic and seems to be doing well from a heart standpoint.  He mostly complains of fatigue.  This has not changed whether he was on or off carvedilol.  I do not think it is due to bradycardia or the medication.  He is currently being worked up for multiple myeloma which is the more likely etiology of his fatigue.  He is on a good heart failure regimen of carvedilol, Jardiance, Lasix, hydralazine, Entresto, and spironolactone.  Will not make any changes at this time.  He previously had a PYP scan for ATTR amyloidosis which was negative in 2021.  He is being evaluated for multiple myeloma, which is more commonly associated with AL amyloidosis.  Hypertension Blood pressure is very well controlled on his regimen as above.  No changes at this time.  Pure hypercholesterolemia Lipids are controlled on rosuvastatin.  Keep working on diet and exercise.    Current medicines are reviewed at length with the patient today.  The patient does not have concerns regarding medicines.  The following changes have been made: Switch Farxiga to [place]  Labs/ tests ordered today include:   Orders Placed This Encounter  Procedures   Lipid panel     Disposition:    FU with Tiffany C. Oval Linsey, MD, [facility] in 6 months.  I,Mathew Stumpf,acting as a Education administrator for [name], MD.,have documented all relevant documentation on  the behalf of [name], MD,as directed by  [name], MD while in the presence of [name], MD.  I, [place] Oval Linsey, MD have reviewed all  documentation for this visit.  The documentation of the exam, diagnosis, procedures, and orders on 02/03/2022 are all accurate and complete.  Signed, Allisen Pidgeon C. Oval Linsey, MD, [facility]  02/03/2022 11:44 AM    [place]

## 2022-02-03 NOTE — Patient Instructions (Addendum)
Medication Instructions:  OK TO TAKE PLAIN ALLEGRA/ZYRTEC/CLARITIN AS NEEDED FOR ALLERGIES   *If you need a refill on your cardiac medications before your next appointment, please call your pharmacy*  Lab Work: FASTING LP WHEN HAVE LABS DONE NEXT WEEK   If you have labs (blood work) drawn today and your tests are completely normal, you will receive your results only by: [place] (if you have MyChart) OR A paper copy in the mail If you have any lab test that is abnormal or we need to change your treatment, we will call you to review the results.  Testing/Procedures: NONE  Follow-Up: At [facility], you and your health needs are our priority.  As part of our continuing mission to provide you with exceptional heart care, we have created designated Provider Care Teams.  These Care Teams include your primary Cardiologist (physician) and Advanced Practice Providers (APPs -  Physician Assistants and Nurse Practitioners) who all work together to provide you with the care you need, when you need it.  We recommend signing up for the patient portal called "MyChart".  Sign up information is provided on this After Visit Summary.  MyChart is used to connect with patients for Virtual Visits (Telemedicine).  Patients are able to view lab/test results, encounter notes, upcoming appointments, etc.  Non-urgent messages can be sent to your provider as well.   To learn more about what you can do with MyChart, go to [email or web address].    Your next appointment:   6 month(s)  The format for your next appointment:   In Person  Provider:   [name], MD

## 2022-02-03 NOTE — Assessment & Plan Note (Signed)
Blood pressure is very well controlled on his regimen as above.  No changes at this time.

## 2022-02-03 NOTE — Assessment & Plan Note (Signed)
Lipids are controlled on rosuvastatin.  Keep working on diet and exercise.

## 2022-02-03 NOTE — Assessment & Plan Note (Addendum)
LVEF has been stable at 40 to 45%.  He is euvolemic and seems to be doing well from a heart standpoint.  He mostly complains of fatigue.  This has not changed whether he was on or off carvedilol.  I do not think it is due to bradycardia or the medication.  He is currently being worked up for multiple myeloma which is the more likely etiology of his fatigue.  He is on a good heart failure regimen of carvedilol, Jardiance, Lasix, hydralazine, Entresto, and spironolactone.  Will not make any changes at this time.  He previously had a PYP scan for ATTR amyloidosis which was negative in 2021.  He is being evaluated for multiple myeloma, which is more commonly associated with AL amyloidosis.

## 2022-02-05 ENCOUNTER — Encounter (HOSPITAL_COMMUNITY): Payer: Self-pay

## 2022-02-10 ENCOUNTER — Inpatient Hospital Stay: Payer: Medicare Other | Attending: Hematology & Oncology

## 2022-02-10 ENCOUNTER — Other Ambulatory Visit: Payer: Self-pay | Admitting: Oncology

## 2022-02-10 ENCOUNTER — Inpatient Hospital Stay (HOSPITAL_BASED_OUTPATIENT_CLINIC_OR_DEPARTMENT_OTHER): Payer: Medicare Other | Admitting: Hematology & Oncology

## 2022-02-10 ENCOUNTER — Encounter: Payer: Self-pay | Admitting: Hematology & Oncology

## 2022-02-10 VITALS — BP 138/66 | HR 51 | Temp 97.7°F | Resp 20 | Ht 68.0 in | Wt 152.0 lb

## 2022-02-10 DIAGNOSIS — D472 Monoclonal gammopathy: Secondary | ICD-10-CM | POA: Insufficient documentation

## 2022-02-10 DIAGNOSIS — D649 Anemia, unspecified: Secondary | ICD-10-CM | POA: Insufficient documentation

## 2022-02-10 LAB — CMP (CANCER CENTER ONLY)
ALT: 16 U/L (ref 0–44)
AST: 21 U/L (ref 15–41)
Albumin: 3.6 g/dL (ref 3.5–5.0)
Alkaline Phosphatase: 61 U/L (ref 38–126)
Anion gap: 5 (ref 5–15)
BUN: 18 mg/dL (ref 8–23)
CO2: 30 mmol/L (ref 22–32)
Calcium: 8.9 mg/dL (ref 8.9–10.3)
Chloride: 106 mmol/L (ref 98–111)
Creatinine: 1.1 mg/dL (ref 0.61–1.24)
GFR, Estimated: >60 mL/min (ref >60)
Glucose, Bld: 236 mg/dL — ABNORMAL HIGH (ref 70–99)
Potassium: 4 mmol/L (ref 3.5–5.1)
Sodium: 141 mmol/L (ref 135–145)
Total Bilirubin: 0.7 mg/dL (ref 0.3–1.2)
Total Protein: 7.2 g/dL (ref 6.5–8.1)

## 2022-02-10 LAB — CBC WITH DIFFERENTIAL (CANCER CENTER ONLY)
Abs Immature Granulocytes: 0.01 10*3/uL (ref 0.00–0.07)
Basophils Absolute: 0 10*3/uL (ref 0.0–0.1)
Basophils Relative: 0 %
Eosinophils Absolute: 0.1 10*3/uL (ref 0.0–0.5)
Eosinophils Relative: 3 %
HCT: 31.5 % — ABNORMAL LOW (ref 39.0–52.0)
Hemoglobin: 10.4 g/dL — ABNORMAL LOW (ref 13.0–17.0)
Immature Granulocytes: 0 %
Lymphocytes Relative: 18 %
Lymphs Abs: 0.8 10*3/uL (ref 0.7–4.0)
MCH: 31.8 pg (ref 26.0–34.0)
MCHC: 33 g/dL (ref 30.0–36.0)
MCV: 96.3 fL (ref 80.0–100.0)
Monocytes Absolute: 0.3 10*3/uL (ref 0.1–1.0)
Monocytes Relative: 7 %
Neutro Abs: 3.4 10*3/uL (ref 1.7–7.7)
Neutrophils Relative %: 72 %
Platelet Count: 190 10*3/uL (ref 150–400)
RBC: 3.27 MIL/uL — ABNORMAL LOW (ref 4.22–5.81)
RDW: 12.5 % (ref 11.5–15.5)
WBC Count: 4.7 10*3/uL (ref 4.0–10.5)
nRBC: 0 % (ref 0.0–0.2)

## 2022-02-10 LAB — LACTATE DEHYDROGENASE: LDH: 161 U/L (ref 98–192)

## 2022-02-10 NOTE — Progress Notes (Addendum)
Hematology and Oncology Follow Up Visit  Earl Calderon [id number] 1950-09-11 70 y.o. 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  We did do a bone marrow biopsy on him.  This was done on 01/22/2022.  The pathology report ([id number]) showed that he had a normocellular marrow with 30% plasma cells.  His cytogenetics were negative.  His FISH panel did show that there was a 1 q. deletion.  We looked at his myeloma levels, his M spike was 1.4g/dL.  His IgG level was 2000 mg/dL.  His Kappa light chain was 13 mg/dL.  He had a bone survey done that was unremarkable.  There is no obvious lytic lesions seen on the bone survey.  He does feel tired.  I think a lot of this might be from his diabetes which is not all that well controlled.  He has had no obvious bleeding.  There is been no fever.  He has had no nausea or vomiting.  He has had no change in bowel or bladder habits.  Overall, I would say his performance status is probably ECOG 1.    Medications:  Current Outpatient Medications:    APPLE CIDER VINEGAR PO, Take 1 tablet by mouth daily at 6 (six) AM., Disp: , Rfl:    ASPIRIN LOW DOSE 81 MG EC tablet, TAKE 1 TABLET BY MOUTH ONCE DAILY (SWALLOW WHOLE), Disp: 90 tablet, Rfl: 3   ELDERBERRY PO, Take 100 mg by mouth daily., Disp: , Rfl:    empagliflozin (JARDIANCE) 10 MG TABS tablet, Take 1 tablet (10 mg total) by mouth daily before breakfast., Disp: 90 tablet, Rfl: 3   ENTRESTO 97-103 MG, TAKE 1 TABLET BY MOUTH TWICE DAILY, Disp: 180 tablet, Rfl: 3   ferrous sulfate 325 (65 FE) MG tablet, Take 1 tablet (325 mg total) by mouth daily with breakfast., Disp: 90 tablet, Rfl: 3   furosemide (LASIX) 40 MG tablet, TAKE 1 TABLET BY MOUTH ONCE DAILY, Disp: 90 tablet, Rfl: 3   glucose blood (FREESTYLE TEST STRIPS) test strip, Use as instructed, Disp: 100 each,  Rfl: 12   insulin aspart (NOVOLOG FLEXPEN) 100 UNIT/ML FlexPen, Before each meal 3 times a day, 140-199 - 2 units, 200-250 - 4 units, 251-299 - 6 units,  300-349 - 8 units,  350 or above 10 units. Insulin PEN if approved, provide syringes and needles if needed., Disp: 15 mL, Rfl: 2   Insulin Pen Needle (NOVOFINE PEN NEEDLE) 32G X 6 MM MISC, USE AS DIRECTED, Disp: 100 each, Rfl: 6   LEVEMIR FLEXTOUCH 100 UNIT/ML FlexPen, Inject 8 Units into the skin 2 (two) times daily., Disp: 15 mL, Rfl: 1   Probiotic Product (PROBIOTIC MULTI-ENZYME) TABS, Take 3 tablets by mouth daily at 6 (six) AM., Disp: , Rfl:    spironolactone (ALDACTONE) 25 MG tablet, TAKE 1 TABLET BY MOUTH ONCE DAILY, Disp: 90 tablet, Rfl: 2   Turmeric 500 MG TABS, Take 2 tablets by mouth daily at 6 (six) AM., Disp: , Rfl:    carvedilol (COREG) 12.5 MG tablet, Take 1 tablet (12.5 mg total) by mouth 2 (two) times daily with a meal., Disp: 180 tablet, Rfl: 3   hydrALAZINE (APRESOLINE) 25 MG tablet, Take 1 tablet (25 mg total) by mouth in the morning and at bedtime., Disp: 180 tablet, Rfl: 3   nitroGLYCERIN (NITROSTAT) 0.4 MG SL tablet, Place  1 tablet (0.4 mg total) under the tongue every 5 (five) minutes as needed for chest pain., Disp: 25 tablet, Rfl: 3   rosuvastatin (CRESTOR) 40 MG tablet, Take 1 tablet (40 mg total) by mouth daily at 6 PM., Disp: 90 tablet, Rfl: 3  Allergies: No Known Allergies  Past Medical History, Surgical history, Social history, and Family History were reviewed and updated.  Review of Systems: Review of Systems  Constitutional:  Positive for fatigue.  HENT:  Negative.    Eyes: Negative.   Respiratory: Negative.    Cardiovascular: Negative.   Gastrointestinal: Negative.   Endocrine: Negative.   Genitourinary: Negative.    Musculoskeletal: Negative.   Skin: Negative.   Neurological: Negative.   Hematological: Negative.   Psychiatric/Behavioral: Negative.      Physical Exam:  height is 5' 8"  (1.727 m) and  weight is 152 lb (68.9 kg). His oral temperature is 97.7 F (36.5 C). His blood pressure is 138/66 and his pulse is 51 (abnormal). His respiration is 20 and oxygen saturation is 100%.   Wt Readings from Last 3 Encounters:  02/10/22 152 lb (68.9 kg)  02/03/22 153 lb (69.4 kg)  01/23/22 152 lb (68.9 kg)    Physical Exam Vitals reviewed.  HENT:     Head: Normocephalic and atraumatic.  Eyes:     Pupils: Pupils are equal, round, and reactive to light.  Cardiovascular:     Rate and Rhythm: Normal rate and regular rhythm.     Heart sounds: Normal heart sounds.  Pulmonary:     Effort: Pulmonary effort is normal.     Breath sounds: Normal breath sounds.  Abdominal:     General: Bowel sounds are normal.     Palpations: Abdomen is soft.  Musculoskeletal:        General: No tenderness or deformity. Normal range of motion.     Cervical back: Normal range of motion.  Lymphadenopathy:     Cervical: No cervical adenopathy.  Skin:    General: Skin is warm and dry.     Findings: No erythema or rash.  Neurological:     Mental Status: He is alert and oriented to person, place, and time.  Psychiatric:        Behavior: Behavior normal.        Thought Content: Thought content normal.        Judgment: Judgment normal.      Lab Results  Component Value Date   WBC 4.7 02/10/2022   HGB 10.4 (L) 02/10/2022   HCT 31.5 (L) 02/10/2022   MCV 96.3 02/10/2022   PLT 190 02/10/2022     Chemistry      Component Value Date/Time   NA 141 02/10/2022 1405   NA 148 (H) 11/25/2021 1521   K 4.0 02/10/2022 1405   CL 106 02/10/2022 1405   CO2 30 02/10/2022 1405   BUN 18 02/10/2022 1405   BUN 19 11/25/2021 1521   CREATININE 1.10 02/10/2022 1405      Component Value Date/Time   CALCIUM 8.9 02/10/2022 1405   ALKPHOS 61 02/10/2022 1405   AST 21 02/10/2022 1405   ALT 16 02/10/2022 1405   BILITOT 0.7 02/10/2022 1405       Impression and Plan: Earl Calderon is a very nice 70 year old  African-American male.  I think he has what I would consider smoldering myeloma.  He has an IgG kappa spike.  I just do not think we have to embark upon therapy on him.  I realize that he does have the abnormality on FISH panel.  Again I just feel that we can follow this along.  He is anemic.  I am not sure this is from his smoldering myeloma.  He may just have a low erythropoietin level.  We will have to check an erythropoietin level on him along with iron studies.  I am doing a 24-hour urine on him.  It would be interesting to see what his 24-hour urine shows with respect to Kappa light chain production.  Back in November 2022, he had a 24-hour urine done.  There was a IgG kappa monoclonal spike.  He had 100 mg/L of Kappa light chain.  He will be interesting to see if this is different.  Again I just feel that we can follow him.  I do still see that we have to embark upon treating him right now.  I will like to get him back to see me in about 2 months or so.  Again we will have to check iron studies and erythropoietin level and reticulocyte count for anemia.    Volanda Napoleon, MD 6/12/20233:23 PM

## 2022-02-11 LAB — IGG, IGA, IGM
IgA: 188 mg/dL (ref 61–437)
IgG (Immunoglobin G), Serum: 2145 mg/dL — ABNORMAL HIGH (ref 603–1613)
IgM (Immunoglobulin M), Srm: 49 mg/dL (ref 20–172)

## 2022-02-11 LAB — KAPPA/LAMBDA LIGHT CHAINS
Kappa free light chain: 120.1 mg/L — ABNORMAL HIGH (ref 3.3–19.4)
Kappa, lambda light chain ratio: 4.92 — ABNORMAL HIGH (ref 0.26–1.65)
Lambda free light chains: 24.4 mg/L (ref 5.7–26.3)

## 2022-02-11 NOTE — Progress Notes (Signed)
I would have to say that this is smoldering multiple myeloma.

## 2022-02-14 LAB — PROTEIN ELECTROPHORESIS, SERUM, WITH REFLEX
A/G Ratio: 0.9 (ref 0.7–1.7)
Albumin ELP: 3.3 g/dL (ref 2.9–4.4)
Alpha-1-Globulin: 0.2 g/dL (ref 0.0–0.4)
Alpha-2-Globulin: 0.6 g/dL (ref 0.4–1.0)
Beta Globulin: 0.7 g/dL (ref 0.7–1.3)
Gamma Globulin: 2.1 g/dL — ABNORMAL HIGH (ref 0.4–1.8)
Globulin, Total: 3.7 g/dL (ref 2.2–3.9)
M-Spike, %: 1.3 g/dL — ABNORMAL HIGH
SPEP Interpretation: 0
Total Protein ELP: 7 g/dL (ref 6.0–8.5)

## 2022-02-14 LAB — IMMUNOFIXATION REFLEX, SERUM
IgA: 202 mg/dL (ref 61–437)
IgG (Immunoglobin G), Serum: 1998 mg/dL — ABNORMAL HIGH (ref 603–1613)
IgM (Immunoglobulin M), Srm: 51 mg/dL (ref 20–172)

## 2022-02-20 NOTE — Progress Notes (Addendum)
[facility] Note  02/26/2022     CHIEF COMPLAINT Patient presents for Retina Follow Up  HISTORY OF PRESENT ILLNESS: Earl Calderon is a 71 y.o. male who presents to the clinic today for:   HPI     Retina Follow Up   Patient presents with  Diabetic Retinopathy.  In both eyes.  Duration of 6 weeks.  Since onset it is stable.  I, the attending physician,  performed the HPI with the patient and updated documentation appropriately.        Comments   6 week follow up PDR OU- Vision appears the same OU.  He is still seeing the oncologist.  Staying fatigued.   BS 190's had to increase insulin this morning.       Last edited by [name], MD on 02/26/2022  2:38 PM.    Pt states vision is stable, pt reports he has smoldering multiple myeloma  per Dr. Marin Olp   Referring physician: [name], MD [address]  [address] [place],  [place] [address]  HISTORICAL INFORMATION:   Selected notes from the MEDICAL RECORD NUMBER Referred by Dr. [name] for severe NPDR OU   CURRENT MEDICATIONS: No current outpatient medications on file. (Ophthalmic Drugs)   No current facility-administered medications for this visit. (Ophthalmic Drugs)   Current Outpatient Medications (Other)  Medication Sig   APPLE CIDER VINEGAR PO Take 1 tablet by mouth daily at 6 (six) AM.   ASPIRIN LOW DOSE 81 MG EC tablet TAKE 1 TABLET BY MOUTH ONCE DAILY (SWALLOW WHOLE)   ELDERBERRY PO Take 100 mg by mouth daily.   empagliflozin (JARDIANCE) 10 MG TABS tablet Take 1 tablet (10 mg total) by mouth daily before breakfast.   ENTRESTO 97-103 MG TAKE 1 TABLET BY MOUTH TWICE DAILY   ferrous sulfate 325 (65 FE) MG tablet Take 1 tablet (325 mg total) by mouth daily with breakfast.   furosemide (LASIX) 40 MG tablet TAKE 1 TABLET BY MOUTH ONCE DAILY   insulin aspart (NOVOLOG FLEXPEN) 100 UNIT/ML FlexPen Before each meal 3 times a day, 140-199 - 2 units, 200-250 - 4 units,  251-299 - 6 units,  300-349 - 8 units,  350 or above 10 units. Insulin PEN if approved, provide syringes and needles if needed.   Insulin Pen Needle (NOVOFINE PEN NEEDLE) 32G X 6 MM MISC USE AS DIRECTED   LEVEMIR FLEXTOUCH 100 UNIT/ML FlexPen Inject 8 Units into the skin 2 (two) times daily.   Probiotic Product (PROBIOTIC MULTI-ENZYME) TABS Take 3 tablets by mouth daily at 6 (six) AM.   spironolactone (ALDACTONE) 25 MG tablet TAKE 1 TABLET BY MOUTH ONCE DAILY   Turmeric 500 MG TABS Take 2 tablets by mouth daily at 6 (six) AM.   carvedilol (COREG) 12.5 MG tablet Take 1 tablet (12.5 mg total) by mouth 2 (two) times daily with a meal.   glucose blood (FREESTYLE TEST STRIPS) test strip Use as instructed   hydrALAZINE (APRESOLINE) 25 MG tablet Take 1 tablet (25 mg total) by mouth in the morning and at bedtime.   nitroGLYCERIN (NITROSTAT) 0.4 MG SL tablet Place 1 tablet (0.4 mg total) under the tongue every 5 (five) minutes as needed for chest pain.   rosuvastatin (CRESTOR) 40 MG tablet Take 1 tablet (40 mg total) by mouth daily at 6 PM.   No current facility-administered medications for this visit. (Other)   REVIEW OF SYSTEMS: ROS   Positive for: Endocrine,  Cardiovascular, Eyes Negative for: Constitutional, Gastrointestinal, Neurological, Skin, Genitourinary, Musculoskeletal, HENT, Respiratory, Psychiatric, Allergic/Imm, Heme/Lymph Last edited by Leonie Douglas, COA on 02/26/2022  1:23 PM.     ALLERGIES No Known Allergies  PAST MEDICAL HISTORY Past Medical History:  Diagnosis Date   CAD in native artery 01/01/2021   Prior LAD PCI.  Currently no symptoms of ischemia.  Encouraged him to do some light exercising.  Continue aspirin, carvedilol, and rosuvastatin.   Cataract    CKD (chronic kidney disease), stage III ([place]) 08/24/2020   Coronary artery disease    Diabetes mellitus without complication ([place])    on meds   Diabetic retinopathy ([place])    Goals of care, counseling/discussion  07/01/2021   Hernia, inguinal    Hypertension    on meds   Hypertensive retinopathy    Lymphadenopathy, cervical 07/01/2021   MGUS (monoclonal gammopathy of unknown significance) 07/01/2021   NSVT (nonsustained ventricular tachycardia) ([place]) 08/24/2020   Pure hypercholesterolemia 08/05/2021   Uncontrolled type 2 diabetes mellitus 11/11/2018   Past Surgical History:  Procedure Laterality Date   CORONARY STENT INTERVENTION N/A 11/15/2018   Procedure: CORONARY STENT INTERVENTION;  Surgeon: [name], MD;  Location: [facility];  Service: Cardiovascular;  Laterality: N/A;   HEMORROIDECTOMY     RIGHT HEART CATH N/A 07/18/2020   Procedure: RIGHT HEART CATH;  Surgeon: [name], MD;  Location: [facility];  Service: Cardiovascular;  Laterality: N/A;   RIGHT/LEFT HEART CATH AND CORONARY ANGIOGRAPHY N/A 11/15/2018   Procedure: RIGHT/LEFT HEART CATH AND CORONARY ANGIOGRAPHY;  Surgeon: [name], MD;  Location: [facility];  Service: Cardiovascular;  Laterality: N/A;   WISDOM TOOTH EXTRACTION     FAMILY HISTORY Family History  Problem Relation Age of Onset   Hypertension Mother    Diabetes Mellitus II Mother    Arrhythmia Mother    Heart disease Mother    Hypertension Sister    Hypertension Brother    Heart disease Brother    Kidney disease Brother    Hypertension Other    Diabetes Mellitus II Other        All 9 sibblings have HTN   Colon cancer Neg Hx    Colon polyps Neg Hx    Esophageal cancer Neg Hx    Stomach cancer Neg Hx    Rectal cancer Neg Hx    SOCIAL HISTORY Social History   Tobacco Use   Smoking status: Never   Smokeless tobacco: Never  Vaping Use   Vaping Use: Never used  Substance Use Topics   Alcohol use: Not Currently   Drug use: Never       OPHTHALMIC EXAM:  Base Eye Exam     Visual Acuity (Snellen - Linear)       Right Left   Dist [place] 20/40 20/60   Dist ph [place] NI 20/50-         Tonometry  (Tonopen, 1:30 PM)       Right Left   Pressure 13 11         Pupils       Dark Light Shape React APD   Right 4 3 Round Brisk None   Left 4 3 Round Brisk None         Visual Fields (Counting fingers)       Left Right   Restrictions Partial outer superior temporal deficiency Partial outer superior nasal deficiency         Neuro/Psych  Oriented x3: Yes   Mood/Affect: Normal         Dilation     Both eyes: 1.0% Mydriacyl, 2.5% Phenylephrine @ 1:30 PM           Slit Lamp and Fundus Exam     Slit Lamp Exam       Right Left   Lids/Lashes Dermatochalasis - upper lid Dermatochalasis - upper lid   Conjunctiva/Sclera Melanosis Melanosis   Cornea arcus arcus   Anterior Chamber deep and clear deep and clear   Iris Round and poorly dilated, No NVI Round and dilated, No NVI   Lens 2-3+ Nuclear sclerosis, 2-3+ Cortical cataract 2-3+ Nuclear sclerosis, 2-3+ Cortical cataract   Anterior Vitreous Vitreous syneresis Vitreous syneresis         Fundus Exam       Right Left   Disc Pink and Sharp, no NVD Pink and Sharp, no NVD   C/D Ratio 0.5 0.4   Macula good foveal reflex, central edema -- improved, scattered MA/DBH - improved, focal exudates temporal macula - improved good foveal reflex, scattered MA/DBH, +edema temporal macula -- slightly improved, +exudates and CWS -- improved   Vessels attenuated, tortuous, focal NV IN arcades--regressing attenuated, Tortuous   Periphery Attached, 360 MA/DBH greatest posteriorly, light 360 PRP changes Attached, 360 DBH           IMAGING AND PROCEDURES  Imaging and Procedures for 02/26/2022  OCT, Retina - OU - Both Eyes       Right Eye Quality was good. Central Foveal Thickness: 309. Progression has improved. Findings include no SRF, abnormal foveal contour, intraretinal hyper-reflective material, intraretinal fluid, vitreomacular adhesion (interval improvement in IRF/IRHM and foveal profile).   Left Eye Quality was  good. Central Foveal Thickness: 201. Progression has improved. Findings include normal foveal contour, no SRF, intraretinal hyper-reflective material, intraretinal fluid, vitreomacular adhesion (interval improvement in IRF/IRHM temporal macula, patchy ORA).   Notes *Images captured and stored on drive  Diagnosis / Impression:  +DME OU OD: interval improvement in IRF/IRHM and foveal profile OS: interval improvement in IRF/IRHM temporal macula, patchy ORA  Clinical management:  See below  Abbreviations: NFP - Normal foveal profile. CME - cystoid macular edema. PED - pigment epithelial detachment. IRF - intraretinal fluid. SRF - subretinal fluid. EZ - ellipsoid zone. ERM - epiretinal membrane. ORA - outer retinal atrophy. ORT - outer retinal tubulation. SRHM - subretinal hyper-reflective material. IRHM - intraretinal hyper-reflective material      Intravitreal Injection, Pharmacologic Agent - OD - Right Eye       Time Out 02/26/2022. 2:16 PM. Confirmed correct patient, procedure, site, and patient consented.   Anesthesia Topical anesthesia was used. Anesthetic medications included Lidocaine 2%, Proparacaine 0.5%.   Procedure Preparation included 5% betadine to ocular surface, eyelid speculum. A (32g) needle was used.   Injection: 1.25 mg Bevacizumab 1.48m/0.05ml   Route: Intravitreal, Site: Right Eye   NDC: [id number] Lot:: [id number] Expiration date: 04/10/2022   Post-op Post injection exam found visual acuity of at least counting fingers. The patient tolerated the procedure well. There were no complications. The patient received written and verbal post procedure care education. Post injection medications were not given.      Intravitreal Injection, Pharmacologic Agent - OS - Left Eye       Time Out 02/26/2022. 2:16 PM. Confirmed correct patient, procedure, site, and patient consented.   Anesthesia Topical anesthesia was used. Anesthetic medications included Lidocaine 2%,  Proparacaine 0.5%.   Procedure Preparation  included 5% betadine to ocular surface, eyelid speculum. A (32g) needle was used.   Injection: 1.25 mg Bevacizumab 1.54m/0.05ml   Route: Intravitreal, Site: Left Eye   NDC:: 27078-675-44 Lot:: [id number] Expiration date: 04/28/2022   Post-op Post injection exam found visual acuity of at least counting fingers. The patient tolerated the procedure well. There were no complications. The patient received written and verbal post procedure care education. Post injection medications were not given.            ASSESSMENT/PLAN:    ICD-10-CM   1. Proliferative diabetic retinopathy of right eye with macular edema associated with type 2 diabetes mellitus (HCC)  E11.3511 OCT, Retina - OU - Both Eyes    Intravitreal Injection, Pharmacologic Agent - OD - Right Eye    Bevacizumab (AVASTIN) SOLN 1.25 mg    2. Severe nonproliferative diabetic retinopathy of left eye with macular edema associated with type 2 diabetes mellitus (HCC)  EH21.9758OCT, Retina - OU - Both Eyes    Intravitreal Injection, Pharmacologic Agent - OS - Left Eye    Bevacizumab (AVASTIN) SOLN 1.25 mg    3. Essential hypertension  I10     4. Hypertensive retinopathy of both eyes  H35.033     5. Combined forms of age-related cataract of both eyes  H25.813      1,2. Proliferative diabetic retinopathy, OD  - severe NPDR OS - delayed to follow up from 10.28.22 to 01.04.23 (9+ wks) - s/p IVA OU #1 (06.06.22), #2 (07.08.22), #3 (08.05.22), #4 (09.02.22), #5 (09.30.22), #6 (10.28.22), #7 (01.04.23), #8 (02.20.33), #9 (04.03.23), #10 (05.15.23)  - s/p PRP OD (06.10.22) - exam shows central edema OU, scattered IRH -- improving - FA (06.06.22) shows OD: focal NVE IN arcades; OS: no NV OS; late leaking MA's OU -- s/p PRP OD 6.10.22 - OCT shows OD: interval improvement in IRF/IRHM and foveal profile; OS: interval improvement in IRF/IRHM temporal macula, patchy ORA at 6 weeks - BCVA -- OD 20/40  (decreased); OS 20/50 (stable) - recommend IVA OU #11 today, 06.28.23 w/ f/u in 6 wks - pt wishes to proceed - RBA of procedure discussed, questions answered - IVA informed consent obtained and re-signed (OU), 06.28.23 - see procedure note - f/u 6 weeks for DFE/OCT, possible injection(s)  3,4. Hypertensive retinopathy OU - discussed importance of tight BP control - monitor  5. Mixed Cataract OU - The symptoms of cataract, surgical options, and treatments and risks were discussed with patient. - discussed diagnosis and progression - under the expert management of Dr. SWyatt Portela- clear from a retina standpoint to proceed with cataract surgery when both patient and surgeon are ready  Ophthalmic Meds Ordered this visit:  Meds ordered this encounter  Medications   Bevacizumab (AVASTIN) SOLN 1.25 mg   Bevacizumab (AVASTIN) SOLN 1.25 mg     Return in about 6 weeks (around 04/09/2022) for f/u PDR OU, DFE, OCT.  There are no Patient Instructions on file for this visit.  This document serves as a record of services personally performed by BGardiner Sleeper MD, PhD. It was created on their behalf by MOrvan Falconer an ophthalmic technician. The creation of this record is the provider's dictation and/or activities during the visit.    Electronically signed by: MOrvan Falconer OA, 02/26/22  2:41 PM  This document serves as a record of services personally performed by BGardiner Sleeper MD, PhD. It was created on their behalf by ASan Jetty BOwens Shark OA an ophthalmic technician. The  creation of this record is the provider's dictation and/or activities during the visit.    Electronically signed by: San Jetty. Owens Shark, [place] 06.28.2023 2:41 PM  Gardiner Sleeper, M.D., Ph.D. Diseases & Surgery of the Retina and Vitreous [facility]  I have reviewed the above documentation for accuracy and completeness, and I agree with the above. Gardiner Sleeper, M.D., Ph.D. 02/26/22 2:44  PM   Abbreviations: M myopia (nearsighted); A astigmatism; H hyperopia (farsighted); P presbyopia; Mrx spectacle prescription;  CTL contact lenses; OD right eye; OS left eye; OU both eyes  XT exotropia; ET esotropia; PEK punctate epithelial keratitis; PEE punctate epithelial erosions; DES dry eye syndrome; MGD meibomian gland dysfunction; ATs artificial tears; PFAT's preservative free artificial tears; [place] nuclear sclerotic cataract; PSC posterior subcapsular cataract; ERM epi-retinal membrane; PVD posterior vitreous detachment; RD retinal detachment; DM diabetes mellitus; DR diabetic retinopathy; NPDR non-proliferative diabetic retinopathy; PDR proliferative diabetic retinopathy; CSME clinically significant macular edema; DME diabetic macular edema; dbh dot blot hemorrhages; CWS cotton wool spot; POAG primary open angle glaucoma; C/D cup-to-disc ratio; HVF humphrey visual field; GVF goldmann visual field; OCT optical coherence tomography; IOP intraocular pressure; BRVO Branch retinal vein occlusion; CRVO central retinal vein occlusion; CRAO central retinal artery occlusion; BRAO branch retinal artery occlusion; RT retinal tear; SB scleral buckle; PPV pars plana vitrectomy; VH Vitreous hemorrhage; PRP panretinal laser photocoagulation; IVK intravitreal kenalog; VMT vitreomacular traction; MH Macular hole;  NVD neovascularization of the disc; NVE neovascularization elsewhere; AREDS age related eye disease study; ARMD age related macular degeneration; POAG primary open angle glaucoma; EBMD epithelial/anterior basement membrane dystrophy; ACIOL anterior chamber intraocular lens; IOL intraocular lens; PCIOL posterior chamber intraocular lens; Phaco/IOL phacoemulsification with intraocular lens placement; [place] photorefractive keratectomy; LASIK laser assisted in situ keratomileusis; HTN hypertension; DM diabetes mellitus; COPD chronic obstructive pulmonary disease

## 2022-02-26 ENCOUNTER — Encounter (INDEPENDENT_AMBULATORY_CARE_PROVIDER_SITE_OTHER): Payer: Self-pay | Admitting: Ophthalmology

## 2022-02-26 ENCOUNTER — Ambulatory Visit (INDEPENDENT_AMBULATORY_CARE_PROVIDER_SITE_OTHER): Payer: Medicare Other | Admitting: Ophthalmology

## 2022-02-26 DIAGNOSIS — H35033 Hypertensive retinopathy, bilateral: Secondary | ICD-10-CM

## 2022-02-26 DIAGNOSIS — H25813 Combined forms of age-related cataract, bilateral: Secondary | ICD-10-CM

## 2022-02-26 DIAGNOSIS — E113412 Type 2 diabetes mellitus with severe nonproliferative diabetic retinopathy with macular edema, left eye: Secondary | ICD-10-CM

## 2022-02-26 DIAGNOSIS — E113511 Type 2 diabetes mellitus with proliferative diabetic retinopathy with macular edema, right eye: Secondary | ICD-10-CM

## 2022-02-26 DIAGNOSIS — I10 Essential (primary) hypertension: Secondary | ICD-10-CM

## 2022-02-26 MED ORDER — BEVACIZUMAB CHEMO INJECTION 1.25MG/0.05ML SYRINGE FOR KALEIDOSCOPE
1.2500 mg | INTRAVITREAL | Status: AC | PRN
  Administered 2022-02-26: 1.25 mg via INTRAVITREAL

## 2022-03-15 ENCOUNTER — Other Ambulatory Visit: Payer: Self-pay | Admitting: Family Medicine

## 2022-03-15 DIAGNOSIS — E785 Hyperlipidemia, unspecified: Secondary | ICD-10-CM

## 2022-03-17 NOTE — Telephone Encounter (Signed)
Requested medications are due for refill today.  yes  Requested medications are on the active medications list.  yes  Last refill. 12/30/2020 #90 3 refills  Future visit scheduled.   no  Notes to clinic.  PCP listed is Dr. Carlis Abbott. Rx expired 01/23/2022.     Requested Prescriptions  Pending Prescriptions Disp Refills   rosuvastatin (CRESTOR) 40 MG tablet [Pharmacy Med Name: ROSUVASTATIN CALCIUM 40 MG TAB] 90 tablet 3    Sig: TAKE ONE TABLET BY MOUTH DAILY AT 6PM     Cardiovascular:  Antilipid - Statins 2 Failed - 03/15/2022  1:00 PM      Failed - Valid encounter within last 12 months    Recent Outpatient Visits           1 year ago Encounter to establish care   [place], [place] W, NP   1 year ago Uncontrolled type 2 diabetes mellitus with hyperglycemia ([place])   [place], [place], NP       Future Appointments             In 4 months [name], MD [facility], [facility]            Failed - Lipid Panel in normal range within the last 12 months    Cholesterol, Total  Date Value Ref Range Status  01/16/2021 103 100 - 199 mg/dL Final   LDL Chol Calc (NIH)  Date Value Ref Range Status  01/16/2021 42 0 - 99 mg/dL Final   HDL  Date Value Ref Range Status  01/16/2021 47 >39 mg/dL Final   Triglycerides  Date Value Ref Range Status  01/16/2021 66 0 - 149 mg/dL Final         Passed - Cr in normal range and within 360 days    Creatinine  Date Value Ref Range Status  02/10/2022 1.10 0.61 - 1.24 mg/dL Final         Passed - Patient is not pregnant

## 2022-03-27 ENCOUNTER — Other Ambulatory Visit (HOSPITAL_BASED_OUTPATIENT_CLINIC_OR_DEPARTMENT_OTHER): Payer: Self-pay | Admitting: Cardiovascular Disease

## 2022-03-27 NOTE — Telephone Encounter (Signed)
Rx request sent to pharmacy.  

## 2022-04-08 ENCOUNTER — Encounter (INDEPENDENT_AMBULATORY_CARE_PROVIDER_SITE_OTHER): Payer: Medicare Other | Admitting: Ophthalmology

## 2022-04-08 DIAGNOSIS — H25813 Combined forms of age-related cataract, bilateral: Secondary | ICD-10-CM

## 2022-04-08 DIAGNOSIS — E113511 Type 2 diabetes mellitus with proliferative diabetic retinopathy with macular edema, right eye: Secondary | ICD-10-CM

## 2022-04-08 DIAGNOSIS — I10 Essential (primary) hypertension: Secondary | ICD-10-CM

## 2022-04-08 DIAGNOSIS — E113412 Type 2 diabetes mellitus with severe nonproliferative diabetic retinopathy with macular edema, left eye: Secondary | ICD-10-CM

## 2022-04-08 DIAGNOSIS — H35033 Hypertensive retinopathy, bilateral: Secondary | ICD-10-CM

## 2022-04-08 NOTE — Progress Notes (Addendum)
[facility] Note  04/16/2022     CHIEF COMPLAINT Patient presents for Retina Follow Up  HISTORY OF PRESENT ILLNESS: Earl Calderon is a 71 y.o. male who presents to the clinic today for:   HPI     Retina Follow Up   Patient presents with  Diabetic Retinopathy.  In both eyes.  Duration of 6 weeks.  Since onset it is stable.  I, the attending physician,  performed the HPI with the patient and updated documentation appropriately.        Comments   Pt feels like vision is about the same, gets fuzzy at times, he has been outside doing yard work, no fol or floaters      Last edited by [name], MD on 04/16/2022  4:31 PM.      Referring physician: Debbra Riding, MD [address] [place],  [place] [address]  HISTORICAL INFORMATION:   Selected notes from the MEDICAL RECORD NUMBER Referred by Dr. [name] for severe NPDR OU   CURRENT MEDICATIONS: No current outpatient medications on file. (Ophthalmic Drugs)   No current facility-administered medications for this visit. (Ophthalmic Drugs)   Current Outpatient Medications (Other)  Medication Sig   APPLE CIDER VINEGAR PO Take 1 tablet by mouth daily at 6 (six) AM.   ASPIRIN LOW DOSE 81 MG EC tablet TAKE 1 TABLET BY MOUTH ONCE DAILY (SWALLOW WHOLE)   carvedilol (COREG) 12.5 MG tablet TAKE ONE TABLET BY MOUTH TWICE DAILY WITH A MEAL   ELDERBERRY PO Take 100 mg by mouth daily.   empagliflozin (JARDIANCE) 10 MG TABS tablet Take 1 tablet (10 mg total) by mouth daily before breakfast.   ENTRESTO 97-103 MG TAKE 1 TABLET BY MOUTH TWICE DAILY   ferrous sulfate 325 (65 FE) MG tablet Take 1 tablet (325 mg total) by mouth daily with breakfast.   furosemide (LASIX) 40 MG tablet TAKE 1 TABLET BY MOUTH ONCE DAILY   glucose blood (FREESTYLE TEST STRIPS) test strip Use as instructed   hydrALAZINE (APRESOLINE) 25 MG tablet Take 1 tablet (25 mg total) by mouth in the morning and at bedtime.   insulin  aspart (NOVOLOG FLEXPEN) 100 UNIT/ML FlexPen Before each meal 3 times a day, 140-199 - 2 units, 200-250 - 4 units, 251-299 - 6 units,  300-349 - 8 units,  350 or above 10 units. Insulin PEN if approved, provide syringes and needles if needed.   Insulin Pen Needle (NOVOFINE PEN NEEDLE) 32G X 6 MM MISC USE AS DIRECTED   LEVEMIR FLEXTOUCH 100 UNIT/ML FlexPen Inject 8 Units into the skin 2 (two) times daily.   nitroGLYCERIN (NITROSTAT) 0.4 MG SL tablet Place 1 tablet (0.4 mg total) under the tongue every 5 (five) minutes as needed for chest pain.   Probiotic Product (PROBIOTIC MULTI-ENZYME) TABS Take 3 tablets by mouth daily at 6 (six) AM.   rosuvastatin (CRESTOR) 40 MG tablet Take 1 tablet (40 mg total) by mouth daily at 6 PM.   spironolactone (ALDACTONE) 25 MG tablet TAKE 1 TABLET BY MOUTH ONCE DAILY   Turmeric 500 MG TABS Take 2 tablets by mouth daily at 6 (six) AM.   No current facility-administered medications for this visit. (Other)   REVIEW OF SYSTEMS: ROS   Positive for: Endocrine, Cardiovascular, Eyes Negative for: Constitutional, Gastrointestinal, Neurological, Skin, Genitourinary, Musculoskeletal, HENT, Respiratory, Psychiatric, Allergic/Imm, Heme/Lymph Last edited by Debbrah Alar, COT on 04/16/2022  1:15 PM.  ALLERGIES No Known Allergies  PAST MEDICAL HISTORY Past Medical History:  Diagnosis Date   CAD in native artery 01/01/2021   Prior LAD PCI.  Currently no symptoms of ischemia.  Encouraged him to do some light exercising.  Continue aspirin, carvedilol, and rosuvastatin.   Cataract    CKD (chronic kidney disease), stage III ([place]) 08/24/2020   Coronary artery disease    Diabetes mellitus without complication ([place])    on meds   Diabetic retinopathy ([place])    Goals of care, counseling/discussion 07/01/2021   Hernia, inguinal    Hypertension    on meds   Hypertensive retinopathy    Lymphadenopathy, cervical 07/01/2021   MGUS (monoclonal gammopathy of unknown  significance) 07/01/2021   NSVT (nonsustained ventricular tachycardia) ([place]) 08/24/2020   Pure hypercholesterolemia 08/05/2021   Uncontrolled type 2 diabetes mellitus 11/11/2018   Past Surgical History:  Procedure Laterality Date   CORONARY STENT INTERVENTION N/A 11/15/2018   Procedure: CORONARY STENT INTERVENTION;  Surgeon: [name], MD;  Location: [facility];  Service: Cardiovascular;  Laterality: N/A;   HEMORROIDECTOMY     RIGHT HEART CATH N/A 07/18/2020   Procedure: RIGHT HEART CATH;  Surgeon: [name], MD;  Location: [facility];  Service: Cardiovascular;  Laterality: N/A;   RIGHT/LEFT HEART CATH AND CORONARY ANGIOGRAPHY N/A 11/15/2018   Procedure: RIGHT/LEFT HEART CATH AND CORONARY ANGIOGRAPHY;  Surgeon: [name], MD;  Location: [facility];  Service: Cardiovascular;  Laterality: N/A;   WISDOM TOOTH EXTRACTION     FAMILY HISTORY Family History  Problem Relation Age of Onset   Hypertension Mother    Diabetes Mellitus II Mother    Arrhythmia Mother    Heart disease Mother    Hypertension Sister    Hypertension Brother    Heart disease Brother    Kidney disease Brother    Hypertension Other    Diabetes Mellitus II Other        All 9 sibblings have HTN   Colon cancer Neg Hx    Colon polyps Neg Hx    Esophageal cancer Neg Hx    Stomach cancer Neg Hx    Rectal cancer Neg Hx    SOCIAL HISTORY Social History   Tobacco Use   Smoking status: Never   Smokeless tobacco: Never  Vaping Use   Vaping Use: Never used  Substance Use Topics   Alcohol use: Not Currently   Drug use: Never       OPHTHALMIC EXAM:  Base Eye Exam     Visual Acuity (Snellen - Linear)       Right Left   Dist [place] 20/50 20/70 -2   Dist ph  NI 20/60         Tonometry (Tonopen, 1:22 PM)       Right Left   Pressure 14 14         Visual Fields (Counting fingers)       Left Right    Full Full         Extraocular Movement        Right Left    Full, Ortho Full, Ortho         Neuro/Psych     Oriented x3: Yes   Mood/Affect: Normal         Dilation     Both eyes: 1.0% Mydriacyl, 2.5% Phenylephrine @ 1:23 PM           Slit Lamp and Fundus Exam  Slit Lamp Exam       Right Left   Lids/Lashes Dermatochalasis - upper lid Dermatochalasis - upper lid   Conjunctiva/Sclera Melanosis Melanosis   Cornea arcus arcus   Anterior Chamber deep and clear deep and clear   Iris Round and poorly dilated, No NVI Round and dilated, No NVI   Lens 2-3+ Nuclear sclerosis, 2-3+ Cortical cataract 2-3+ Nuclear sclerosis, 2-3+ Cortical cataract   Anterior Vitreous Vitreous syneresis Vitreous syneresis         Fundus Exam       Right Left   Disc Pink and Sharp, no NVD Pink and Sharp, no NVD   C/D Ratio 0.5 0.4   Macula good foveal reflex, central edema -- slightly increased, scattered MA/DBH - improved, focal exudates temporal macula - improved good foveal reflex, scattered MA/DBH, +edema temporal macula -- slightly increased, +exudates and CWS -- improved   Vessels attenuated, tortuous, focal NV IN arcades--regressing attenuated, Tortuous   Periphery Attached, 360 MA/DBH greatest posteriorly, light 360 PRP changes Attached, 360 DBH           IMAGING AND PROCEDURES  Imaging and Procedures for 04/16/2022  OCT, Retina - OU - Both Eyes       Right Eye Quality was good. Central Foveal Thickness: 422. Progression has worsened. Findings include no SRF, abnormal foveal contour, intraretinal hyper-reflective material, intraretinal fluid, vitreomacular adhesion (interval increase in central IRF and worsening of foveal contour).   Left Eye Quality was good. Central Foveal Thickness: 216. Progression has worsened. Findings include normal foveal contour, no SRF, intraretinal hyper-reflective material, intraretinal fluid, vitreomacular adhesion (Mild interval increase in IRF temporal macula, patchy ORA).   Notes *Images  captured and stored on drive  Diagnosis / Impression:  +DME OU OD: interval increase in central IRF and worsening of foveal contour OS: Mild interval increase in IRF temporal macula, patchy ORA  Clinical management:  See below  Abbreviations: NFP - Normal foveal profile. CME - cystoid macular edema. PED - pigment epithelial detachment. IRF - intraretinal fluid. SRF - subretinal fluid. EZ - ellipsoid zone. ERM - epiretinal membrane. ORA - outer retinal atrophy. ORT - outer retinal tubulation. SRHM - subretinal hyper-reflective material. IRHM - intraretinal hyper-reflective material      Intravitreal Injection, Pharmacologic Agent - OD - Right Eye       Time Out 04/16/2022. 1:49 PM. Confirmed correct patient, procedure, site, and patient consented.   Anesthesia Topical anesthesia was used. Anesthetic medications included Lidocaine 2%, Proparacaine 0.5%.   Procedure Preparation included 5% betadine to ocular surface, eyelid speculum. A supplied (32g) needle was used.   Injection: 1.25 mg Bevacizumab 1.'25mg'$ /0.50m   Route: Intravitreal, Site: Right Eye   NDC:: 75102-585-27 Lot: [id number]'@9'$ , Expiration date: 06/03/2022   Post-op Post injection exam found visual acuity of at least counting fingers. The patient tolerated the procedure well. There were no complications. The patient received written and verbal post procedure care education. Post injection medications were not given.      Intravitreal Injection, Pharmacologic Agent - OS - Left Eye       Time Out 04/16/2022. 1:49 PM. Confirmed correct patient, procedure, site, and patient consented.   Anesthesia Topical anesthesia was used. Anesthetic medications included Lidocaine 2%, Proparacaine 0.5%.   Procedure Preparation included 5% betadine to ocular surface, eyelid speculum. A (32g) needle was used.   Injection: 1.25 mg Bevacizumab 1.'25mg'$ /0.047m  Route: Intravitreal, Site: Left Eye   NDC: 5080mLot: [id number] Expiration date: 06/20/2022  Post-op Post injection exam found visual acuity of at least counting fingers. The patient tolerated the procedure well. There were no complications. The patient received written and verbal post procedure care education. Post injection medications were not given.            ASSESSMENT/PLAN:    ICD-10-CM   1. Proliferative diabetic retinopathy of right eye with macular edema associated with type 2 diabetes mellitus (HCC)  E11.3511 OCT, Retina - OU - Both Eyes    Intravitreal Injection, Pharmacologic Agent - OD - Right Eye    Bevacizumab (AVASTIN) SOLN 1.25 mg    2. Severe nonproliferative diabetic retinopathy of left eye with macular edema associated with type 2 diabetes mellitus (HCC)  J18.8416 Intravitreal Injection, Pharmacologic Agent - OS - Left Eye    Bevacizumab (AVASTIN) SOLN 1.25 mg    3. Essential hypertension  I10     4. Hypertensive retinopathy of both eyes  H35.033     5. Combined forms of age-related cataract of both eyes  H25.813      1,2. Proliferative diabetic retinopathy, OD  - severe NPDR OS  - delayed f/u today -- 7 wks instead of 6 - delayed to follow up from 10.28.22 to 01.04.23 (9+ wks) - s/p IVA OU #1 (06.06.22), #2 (07.08.22), #3 (08.05.22), #4 (09.02.22), #5 (09.30.22), #6 (10.28.22), #7 (01.04.23), #8 (02.20.33), #9 (04.03.23), #10 (05.15.23), #11 (06.28.23) **h/o increased IRF/DME OU at 7 wks, noted on 08.16.23**  - s/p PRP OD (06.10.22) - exam shows central edema OU, scattered IRH -- improving - FA (06.06.22) shows OD: focal NVE IN arcades; OS: no NV OS; late leaking MA's OU -- s/p PRP OD 6.10.22 - OCT shows OD: OD: interval increase in central IRF and worsening of foveal contour; OS: Mild interval increase in IRF temporal macula, patchy ORA at 6 weeks - BCVA -- OD 20/50 (decreased); OS 20/60 (decreased) - recommend IVA OU #12 today, 08.16.23 w/ f/u in 5-6 wks - pt wishes to proceed - RBA of procedure discussed,  questions answered - IVA informed consent obtained and re-signed (OU), 06.28.23 - see procedure note - f/u 5-6 weeks for DFE/OCT, possible injection(s)  3,4. Hypertensive retinopathy OU - discussed importance of tight BP control - monitor  5. Mixed Cataract OU - The symptoms of cataract, surgical options, and treatments and risks were discussed with patient. - discussed diagnosis and progression - under the expert management of Dr. Wyatt Portela - clear from a retina standpoint to proceed with cataract surgery when both patient and surgeon are ready  Ophthalmic Meds Ordered this visit:  Meds ordered this encounter  Medications   Bevacizumab (AVASTIN) SOLN 1.25 mg   Bevacizumab (AVASTIN) SOLN 1.25 mg     Return for f/u 5-6 weeks, +DME OU, Dilated Exam, OCT, Possible Injxn.  There are no Patient Instructions on file for this visit.  This document serves as a record of services personally performed by Gardiner Sleeper, MD, PhD. It was created on their behalf by Orvan Falconer, an ophthalmic technician. The creation of this record is the provider's dictation and/or activities during the visit.    Electronically signed by: Orvan Falconer, OA, 04/16/22  4:42 PM  This document serves as a record of services personally performed by Gardiner Sleeper, MD, PhD. It was created on their behalf by San Jetty. Owens Shark, OA an ophthalmic technician. The creation of this record is the provider's dictation and/or activities during the visit.    Electronically signed by: San Jetty.  Marguerita Merles 08.16.2023 4:42 PM  Gardiner Sleeper, M.D., Ph.D. Diseases & Surgery of the Retina and Vitreous [facility]  I have reviewed the above documentation for accuracy and completeness, and I agree with the above. Gardiner Sleeper, M.D., Ph.D. 04/16/22 4:42 PM   Abbreviations: M myopia (nearsighted); A astigmatism; H hyperopia (farsighted); P presbyopia; Mrx spectacle prescription;  CTL contact  lenses; OD right eye; OS left eye; OU both eyes  XT exotropia; ET esotropia; PEK punctate epithelial keratitis; PEE punctate epithelial erosions; DES dry eye syndrome; MGD meibomian gland dysfunction; ATs artificial tears; PFAT's preservative free artificial tears; [place] nuclear sclerotic cataract; PSC posterior subcapsular cataract; ERM epi-retinal membrane; PVD posterior vitreous detachment; RD retinal detachment; DM diabetes mellitus; DR diabetic retinopathy; NPDR non-proliferative diabetic retinopathy; PDR proliferative diabetic retinopathy; CSME clinically significant macular edema; DME diabetic macular edema; dbh dot blot hemorrhages; CWS cotton wool spot; POAG primary open angle glaucoma; C/D cup-to-disc ratio; HVF humphrey visual field; GVF goldmann visual field; OCT optical coherence tomography; IOP intraocular pressure; BRVO Branch retinal vein occlusion; CRVO central retinal vein occlusion; CRAO central retinal artery occlusion; BRAO branch retinal artery occlusion; RT retinal tear; SB scleral buckle; PPV pars plana vitrectomy; VH Vitreous hemorrhage; PRP panretinal laser photocoagulation; IVK intravitreal kenalog; VMT vitreomacular traction; MH Macular hole;  NVD neovascularization of the disc; NVE neovascularization elsewhere; AREDS age related eye disease study; ARMD age related macular degeneration; POAG primary open angle glaucoma; EBMD epithelial/anterior basement membrane dystrophy; ACIOL anterior chamber intraocular lens; IOL intraocular lens; PCIOL posterior chamber intraocular lens; Phaco/IOL phacoemulsification with intraocular lens placement; [place] photorefractive keratectomy; LASIK laser assisted in situ keratomileusis; HTN hypertension; DM diabetes mellitus; COPD chronic obstructive pulmonary disease

## 2022-04-09 ENCOUNTER — Other Ambulatory Visit: Payer: Self-pay

## 2022-04-09 ENCOUNTER — Inpatient Hospital Stay (HOSPITAL_BASED_OUTPATIENT_CLINIC_OR_DEPARTMENT_OTHER): Payer: Medicare Other | Admitting: Hematology & Oncology

## 2022-04-09 ENCOUNTER — Inpatient Hospital Stay: Payer: Medicare Other | Attending: Hematology & Oncology

## 2022-04-09 ENCOUNTER — Encounter: Payer: Self-pay | Admitting: Hematology & Oncology

## 2022-04-09 ENCOUNTER — Encounter (INDEPENDENT_AMBULATORY_CARE_PROVIDER_SITE_OTHER): Payer: Medicare Other | Admitting: Ophthalmology

## 2022-04-09 VITALS — BP 159/76 | HR 56 | Temp 97.8°F | Resp 18 | Wt 146.0 lb

## 2022-04-09 DIAGNOSIS — D472 Monoclonal gammopathy: Secondary | ICD-10-CM | POA: Diagnosis present

## 2022-04-09 DIAGNOSIS — D649 Anemia, unspecified: Secondary | ICD-10-CM | POA: Diagnosis not present

## 2022-04-09 DIAGNOSIS — Z794 Long term (current) use of insulin: Secondary | ICD-10-CM | POA: Insufficient documentation

## 2022-04-09 DIAGNOSIS — Z7982 Long term (current) use of aspirin: Secondary | ICD-10-CM | POA: Diagnosis not present

## 2022-04-09 DIAGNOSIS — Z7984 Long term (current) use of oral hypoglycemic drugs: Secondary | ICD-10-CM | POA: Diagnosis not present

## 2022-04-09 DIAGNOSIS — E119 Type 2 diabetes mellitus without complications: Secondary | ICD-10-CM | POA: Diagnosis not present

## 2022-04-09 DIAGNOSIS — Z79899 Other long term (current) drug therapy: Secondary | ICD-10-CM | POA: Insufficient documentation

## 2022-04-09 LAB — CBC WITH DIFFERENTIAL (CANCER CENTER ONLY)
Abs Immature Granulocytes: 0.02 10*3/uL (ref 0.00–0.07)
Basophils Absolute: 0 10*3/uL (ref 0.0–0.1)
Basophils Relative: 0 %
Eosinophils Absolute: 0.1 10*3/uL (ref 0.0–0.5)
Eosinophils Relative: 2 %
HCT: 33.2 % — ABNORMAL LOW (ref 39.0–52.0)
Hemoglobin: 11 g/dL — ABNORMAL LOW (ref 13.0–17.0)
Immature Granulocytes: 0 %
Lymphocytes Relative: 19 %
Lymphs Abs: 1 10*3/uL (ref 0.7–4.0)
MCH: 31.7 pg (ref 26.0–34.0)
MCHC: 33.1 g/dL (ref 30.0–36.0)
MCV: 95.7 fL (ref 80.0–100.0)
Monocytes Absolute: 0.3 10*3/uL (ref 0.1–1.0)
Monocytes Relative: 6 %
Neutro Abs: 4 10*3/uL (ref 1.7–7.7)
Neutrophils Relative %: 73 %
Platelet Count: 153 10*3/uL (ref 150–400)
RBC: 3.47 MIL/uL — ABNORMAL LOW (ref 4.22–5.81)
RDW: 12.2 % (ref 11.5–15.5)
WBC Count: 5.4 10*3/uL (ref 4.0–10.5)
nRBC: 0 % (ref 0.0–0.2)

## 2022-04-09 LAB — CMP (CANCER CENTER ONLY)
ALT: 19 U/L (ref 0–44)
AST: 22 U/L (ref 15–41)
Albumin: 4 g/dL (ref 3.5–5.0)
Alkaline Phosphatase: 52 U/L (ref 38–126)
Anion gap: 8 (ref 5–15)
BUN: 34 mg/dL — ABNORMAL HIGH (ref 8–23)
CO2: 29 mmol/L (ref 22–32)
Calcium: 9.4 mg/dL (ref 8.9–10.3)
Chloride: 105 mmol/L (ref 98–111)
Creatinine: 1.31 mg/dL — ABNORMAL HIGH (ref 0.61–1.24)
GFR, Estimated: 59 mL/min — ABNORMAL LOW (ref >60)
Glucose, Bld: 106 mg/dL — ABNORMAL HIGH (ref 70–99)
Potassium: 3.8 mmol/L (ref 3.5–5.1)
Sodium: 142 mmol/L (ref 135–145)
Total Bilirubin: 0.8 mg/dL (ref 0.3–1.2)
Total Protein: 7.7 g/dL (ref 6.5–8.1)

## 2022-04-09 LAB — FERRITIN: Ferritin: 94 ng/mL (ref 24–336)

## 2022-04-09 NOTE — Progress Notes (Signed)
Hematology and Oncology Follow Up Visit  Earl Calderon [id number] 07/31/1951 70 y.o. 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  We did do a bone marrow biopsy on him.  This was done on 01/22/2022.  The pathology report ([id number]) showed that he had a normocellular marrow with 30% plasma cells.  His cytogenetics were negative.  His FISH panel did show that there was a 1q. deletion.  The last time that we saw him back in June, his monoclonal spike was 1.3 g/dL.  The IgG level was 2000 mg/dL.  His Kappa light chain was 12 mg/dL.  All this is holding steady.  Again, I's believe that diabetes will be his biggest problem long-term.  His blood sugar is well-controlled today.  He has had no issues with nausea or vomiting.  He has had no change in bowel or bladder habits.  He has had no cough or shortness of breath.  He has had no fever.  There is been no rashes.  He has had no leg swelling.  Overall, I was his performance status is probably ECOG 1.   Medications:  Current Outpatient Medications:    APPLE CIDER VINEGAR PO, Take 1 tablet by mouth daily at 6 (six) AM., Disp: , Rfl:    ASPIRIN LOW DOSE 81 MG EC tablet, TAKE 1 TABLET BY MOUTH ONCE DAILY (SWALLOW WHOLE), Disp: 90 tablet, Rfl: 3   carvedilol (COREG) 12.5 MG tablet, TAKE ONE TABLET BY MOUTH TWICE DAILY WITH A MEAL, Disp: 180 tablet, Rfl: 1   ELDERBERRY PO, Take 100 mg by mouth daily., Disp: , Rfl:    empagliflozin (JARDIANCE) 10 MG TABS tablet, Take 1 tablet (10 mg total) by mouth daily before breakfast., Disp: 90 tablet, Rfl: 3   ENTRESTO 97-103 MG, TAKE 1 TABLET BY MOUTH TWICE DAILY, Disp: 180 tablet, Rfl: 3   ferrous sulfate 325 (65 FE) MG tablet, Take 1 tablet (325 mg total) by mouth daily with breakfast., Disp: 90 tablet, Rfl: 3   furosemide (LASIX) 40 MG tablet, TAKE 1 TABLET BY MOUTH ONCE  DAILY, Disp: 90 tablet, Rfl: 3   glucose blood (FREESTYLE TEST STRIPS) test strip, Use as instructed, Disp: 100 each, Rfl: 12   hydrALAZINE (APRESOLINE) 25 MG tablet, Take 1 tablet (25 mg total) by mouth in the morning and at bedtime., Disp: 180 tablet, Rfl: 3   insulin aspart (NOVOLOG FLEXPEN) 100 UNIT/ML FlexPen, Before each meal 3 times a day, 140-199 - 2 units, 200-250 - 4 units, 251-299 - 6 units,  300-349 - 8 units,  350 or above 10 units. Insulin PEN if approved, provide syringes and needles if needed., Disp: 15 mL, Rfl: 2   Insulin Pen Needle (NOVOFINE PEN NEEDLE) 32G X 6 MM MISC, USE AS DIRECTED, Disp: 100 each, Rfl: 6   LEVEMIR FLEXTOUCH 100 UNIT/ML FlexPen, Inject 8 Units into the skin 2 (two) times daily., Disp: 15 mL, Rfl: 1   nitroGLYCERIN (NITROSTAT) 0.4 MG SL tablet, Place 1 tablet (0.4 mg total) under the tongue every 5 (five) minutes as needed for chest pain., Disp: 25 tablet, Rfl: 3   Probiotic Product (PROBIOTIC MULTI-ENZYME) TABS, Take 3 tablets by mouth daily at 6 (six) AM., Disp: , Rfl:    rosuvastatin (CRESTOR) 40 MG tablet, Take 1 tablet (40 mg total) by mouth daily at 6 PM., Disp: 90 tablet, Rfl:  3   spironolactone (ALDACTONE) 25 MG tablet, TAKE 1 TABLET BY MOUTH ONCE DAILY, Disp: 90 tablet, Rfl: 2   Turmeric 500 MG TABS, Take 2 tablets by mouth daily at 6 (six) AM., Disp: , Rfl:   Allergies: No Known Allergies  Past Medical History, Surgical history, Social history, and Family History were reviewed and updated.  Review of Systems: Review of Systems  Constitutional:  Positive for fatigue.  HENT:  Negative.    Eyes: Negative.   Respiratory: Negative.    Cardiovascular: Negative.   Gastrointestinal: Negative.   Endocrine: Negative.   Genitourinary: Negative.    Musculoskeletal: Negative.   Skin: Negative.   Neurological: Negative.   Hematological: Negative.   Psychiatric/Behavioral: Negative.      Physical Exam:  weight is 146 lb (66.2 kg). His oral  temperature is 97.8 F (36.6 C). His blood pressure is 159/76 (abnormal) and his pulse is 56 (abnormal). His respiration is 18 and oxygen saturation is 99%.   Wt Readings from Last 3 Encounters:  04/09/22 146 lb (66.2 kg)  02/10/22 152 lb (68.9 kg)  02/03/22 153 lb (69.4 kg)    Physical Exam Vitals reviewed.  HENT:     Head: Normocephalic and atraumatic.  Eyes:     Pupils: Pupils are equal, round, and reactive to light.  Cardiovascular:     Rate and Rhythm: Normal rate and regular rhythm.     Heart sounds: Normal heart sounds.  Pulmonary:     Effort: Pulmonary effort is normal.     Breath sounds: Normal breath sounds.  Abdominal:     General: Bowel sounds are normal.     Palpations: Abdomen is soft.  Musculoskeletal:        General: No tenderness or deformity. Normal range of motion.     Cervical back: Normal range of motion.  Lymphadenopathy:     Cervical: No cervical adenopathy.  Skin:    General: Skin is warm and dry.     Findings: No erythema or rash.  Neurological:     Mental Status: He is alert and oriented to person, place, and time.  Psychiatric:        Behavior: Behavior normal.        Thought Content: Thought content normal.        Judgment: Judgment normal.     Lab Results  Component Value Date   WBC 5.4 04/09/2022   HGB 11.0 (L) 04/09/2022   HCT 33.2 (L) 04/09/2022   MCV 95.7 04/09/2022   PLT 153 04/09/2022     Chemistry      Component Value Date/Time   NA 141 02/10/2022 1405   NA 148 (H) 11/25/2021 1521   K 4.0 02/10/2022 1405   CL 106 02/10/2022 1405   CO2 30 02/10/2022 1405   BUN 18 02/10/2022 1405   BUN 19 11/25/2021 1521   CREATININE 1.10 02/10/2022 1405      Component Value Date/Time   CALCIUM 8.9 02/10/2022 1405   ALKPHOS 61 02/10/2022 1405   AST 21 02/10/2022 1405   ALT 16 02/10/2022 1405   BILITOT 0.7 02/10/2022 1405       Impression and Plan: Earl Calderon is a very nice 71 year old African-American male.  I think he has  what I would consider smoldering myeloma.  He has an IgG kappa spike.  Given his latest myeloma numbers, I still think we can just follow him along.  We will see what today's myeloma numbers look like.  Again, I  would like to hope that there is still stable.  His total protein really is no higher.  I would like to see him back in November.  I would like to get him back before the Holiday season.  He is slightly anemic.  I suspect this probably is more so from his diabetes.  We are checking his erythropoietin level.   Volanda Napoleon, MD 8/9/20233:58 PM

## 2022-04-10 ENCOUNTER — Telehealth: Payer: Self-pay | Admitting: *Deleted

## 2022-04-10 LAB — IRON AND IRON BINDING CAPACITY (CC-WL,HP ONLY)
Iron: 53 ug/dL (ref 45–182)
Saturation Ratios: 20 % (ref 17.9–39.5)
TIBC: 265 ug/dL (ref 250–450)
UIBC: 212 ug/dL (ref 117–376)

## 2022-04-10 LAB — KAPPA/LAMBDA LIGHT CHAINS
Kappa free light chain: 120.9 mg/L — ABNORMAL HIGH (ref 3.3–19.4)
Kappa, lambda light chain ratio: 4.95 — ABNORMAL HIGH (ref 0.26–1.65)
Lambda free light chains: 24.4 mg/L (ref 5.7–26.3)

## 2022-04-10 LAB — IGG, IGA, IGM
IgA: 188 mg/dL (ref 61–437)
IgG (Immunoglobin G), Serum: 2281 mg/dL — ABNORMAL HIGH (ref 603–1613)
IgM (Immunoglobulin M), Srm: 55 mg/dL (ref 20–172)

## 2022-04-10 NOTE — Telephone Encounter (Signed)
Per 04/09/22 los - called and lvm of upcoming appointments - requested call back to confirm

## 2022-04-11 LAB — UPEP/UIFE/LIGHT CHAINS/TP, 24-HR UR
% BETA, Urine: 43 %
ALPHA 1 URINE: 4.9 %
Albumin, U: 35.1 %
Alpha 2, Urine: 8.1 %
Free Kappa Lt Chains,Ur: 78.33 mg/L (ref 1.17–86.46)
Free Kappa/Lambda Ratio: 17.37 — ABNORMAL HIGH (ref 1.83–14.26)
Free Lambda Lt Chains,Ur: 4.51 mg/L (ref 0.27–15.21)
GAMMA GLOBULIN URINE: 8.8 %
Total Protein, Urine-Ur/day: 653 mg/24 hr — ABNORMAL HIGH (ref 30–150)
Total Protein, Urine: 36.3 mg/dL
Total Volume: 1800

## 2022-04-12 LAB — ERYTHROPOIETIN: Erythropoietin: 4.6 m[IU]/mL (ref 2.6–18.5)

## 2022-04-15 ENCOUNTER — Encounter (INDEPENDENT_AMBULATORY_CARE_PROVIDER_SITE_OTHER): Payer: Medicare Other | Admitting: Ophthalmology

## 2022-04-16 ENCOUNTER — Encounter (INDEPENDENT_AMBULATORY_CARE_PROVIDER_SITE_OTHER): Payer: Self-pay | Admitting: Ophthalmology

## 2022-04-16 ENCOUNTER — Ambulatory Visit (INDEPENDENT_AMBULATORY_CARE_PROVIDER_SITE_OTHER): Payer: Medicare Other | Admitting: Ophthalmology

## 2022-04-16 DIAGNOSIS — E113511 Type 2 diabetes mellitus with proliferative diabetic retinopathy with macular edema, right eye: Secondary | ICD-10-CM | POA: Diagnosis not present

## 2022-04-16 DIAGNOSIS — I10 Essential (primary) hypertension: Secondary | ICD-10-CM | POA: Diagnosis not present

## 2022-04-16 DIAGNOSIS — H25813 Combined forms of age-related cataract, bilateral: Secondary | ICD-10-CM

## 2022-04-16 DIAGNOSIS — E113412 Type 2 diabetes mellitus with severe nonproliferative diabetic retinopathy with macular edema, left eye: Secondary | ICD-10-CM | POA: Diagnosis not present

## 2022-04-16 DIAGNOSIS — H35033 Hypertensive retinopathy, bilateral: Secondary | ICD-10-CM | POA: Diagnosis not present

## 2022-04-16 MED ORDER — BEVACIZUMAB CHEMO INJECTION 1.25MG/0.05ML SYRINGE FOR KALEIDOSCOPE
1.2500 mg | INTRAVITREAL | Status: AC | PRN
  Administered 2022-04-16: 1.25 mg via INTRAVITREAL

## 2022-04-21 ENCOUNTER — Encounter (HOSPITAL_COMMUNITY): Payer: Self-pay

## 2022-04-21 ENCOUNTER — Emergency Department (HOSPITAL_COMMUNITY)
Admission: EM | Admit: 2022-04-21 | Discharge: 2022-04-22 | Disposition: A | Discharge status: Home / Self Care | Payer: Medicare Other | Attending: Emergency Medicine | Admitting: Emergency Medicine

## 2022-04-21 ENCOUNTER — Emergency Department (HOSPITAL_COMMUNITY): Payer: Medicare Other

## 2022-04-21 ENCOUNTER — Other Ambulatory Visit: Payer: Self-pay

## 2022-04-21 DIAGNOSIS — E1122 Type 2 diabetes mellitus with diabetic chronic kidney disease: Secondary | ICD-10-CM | POA: Diagnosis not present

## 2022-04-21 DIAGNOSIS — I129 Hypertensive chronic kidney disease with stage 1 through stage 4 chronic kidney disease, or unspecified chronic kidney disease: Secondary | ICD-10-CM | POA: Insufficient documentation

## 2022-04-21 DIAGNOSIS — Z09 Encounter for follow-up examination after completed treatment for conditions other than malignant neoplasm: Secondary | ICD-10-CM | POA: Diagnosis not present

## 2022-04-21 DIAGNOSIS — R519 Headache, unspecified: Secondary | ICD-10-CM | POA: Insufficient documentation

## 2022-04-21 DIAGNOSIS — I2510 Atherosclerotic heart disease of native coronary artery without angina pectoris: Secondary | ICD-10-CM | POA: Insufficient documentation

## 2022-04-21 DIAGNOSIS — Z7982 Long term (current) use of aspirin: Secondary | ICD-10-CM | POA: Insufficient documentation

## 2022-04-21 DIAGNOSIS — M79605 Pain in left leg: Secondary | ICD-10-CM | POA: Insufficient documentation

## 2022-04-21 DIAGNOSIS — R451 Restlessness and agitation: Secondary | ICD-10-CM | POA: Insufficient documentation

## 2022-04-21 DIAGNOSIS — N183 Chronic kidney disease, stage 3 unspecified: Secondary | ICD-10-CM | POA: Insufficient documentation

## 2022-04-21 LAB — CBG MONITORING, ED: Glucose-Capillary: 103 mg/dL — ABNORMAL HIGH (ref 70–99)
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
## 2022-04-22 ENCOUNTER — Emergency Department (HOSPITAL_COMMUNITY): Payer: Medicare Other

## 2022-04-22 ENCOUNTER — Other Ambulatory Visit: Payer: Self-pay

## 2022-04-22 ENCOUNTER — Encounter (HOSPITAL_COMMUNITY): Payer: Self-pay | Admitting: Emergency Medicine

## 2022-04-22 ENCOUNTER — Emergency Department (HOSPITAL_COMMUNITY)
Admission: EM | Admit: 2022-04-22 | Discharge: 2022-04-22 | Disposition: A | Discharge status: Home / Self Care | Payer: Medicare Other | Source: Home / Self Care | Attending: Emergency Medicine | Admitting: Emergency Medicine

## 2022-04-22 DIAGNOSIS — Z09 Encounter for follow-up examination after completed treatment for conditions other than malignant neoplasm: Secondary | ICD-10-CM | POA: Insufficient documentation

## 2022-04-22 DIAGNOSIS — Z7982 Long term (current) use of aspirin: Secondary | ICD-10-CM | POA: Insufficient documentation

## 2022-04-22 DIAGNOSIS — R451 Restlessness and agitation: Secondary | ICD-10-CM | POA: Insufficient documentation

## 2022-04-22 MED ORDER — ACETAMINOPHEN 500 MG PO TABS
1000.0000 mg | ORAL_TABLET | Freq: Once | ORAL | Status: AC
  Administered 2022-04-22: 1000 mg via ORAL
  Filled 2022-04-22 (×2): qty 2

## 2022-04-22 NOTE — ED Notes (Signed)
RN reviewed discharge instructions with pt. Pt verbalized understanding and had no further questions. VSS upon discharge.  
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
## 2022-04-22 NOTE — ED Triage Notes (Signed)
Patient states information on his discharge papers "  is not accurate" .

## 2022-04-22 NOTE — ED Notes (Signed)
Patient transported to X-ray 
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
## 2022-04-22 NOTE — Discharge Instructions (Addendum)
You can call medical records in the morning to get your chart information.  [phone]

## 2022-04-29 ENCOUNTER — Encounter (INDEPENDENT_AMBULATORY_CARE_PROVIDER_SITE_OTHER): Payer: Self-pay | Admitting: Ophthalmology

## 2022-04-29 ENCOUNTER — Ambulatory Visit (INDEPENDENT_AMBULATORY_CARE_PROVIDER_SITE_OTHER): Payer: Medicare Other | Admitting: Ophthalmology

## 2022-04-29 DIAGNOSIS — H25813 Combined forms of age-related cataract, bilateral: Secondary | ICD-10-CM

## 2022-04-29 DIAGNOSIS — E113412 Type 2 diabetes mellitus with severe nonproliferative diabetic retinopathy with macular edema, left eye: Secondary | ICD-10-CM

## 2022-04-29 DIAGNOSIS — I10 Essential (primary) hypertension: Secondary | ICD-10-CM

## 2022-04-29 DIAGNOSIS — E113511 Type 2 diabetes mellitus with proliferative diabetic retinopathy with macular edema, right eye: Secondary | ICD-10-CM | POA: Diagnosis not present

## 2022-04-29 DIAGNOSIS — H35033 Hypertensive retinopathy, bilateral: Secondary | ICD-10-CM | POA: Diagnosis not present

## 2022-04-29 DIAGNOSIS — S0591XA Unspecified injury of right eye and orbit, initial encounter: Secondary | ICD-10-CM | POA: Diagnosis not present
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
## 2022-05-12 ENCOUNTER — Other Ambulatory Visit (HOSPITAL_BASED_OUTPATIENT_CLINIC_OR_DEPARTMENT_OTHER): Payer: Self-pay | Admitting: Cardiovascular Disease

## 2022-05-12 DIAGNOSIS — E785 Hyperlipidemia, unspecified: Secondary | ICD-10-CM

## 2022-05-12 NOTE — Telephone Encounter (Signed)
Rx(s) sent to pharmacy electronically.  

## 2022-05-21 ENCOUNTER — Encounter (INDEPENDENT_AMBULATORY_CARE_PROVIDER_SITE_OTHER): Payer: Medicare Other | Admitting: Ophthalmology

## 2022-06-03 ENCOUNTER — Encounter (INDEPENDENT_AMBULATORY_CARE_PROVIDER_SITE_OTHER): Payer: Medicare Other | Admitting: Ophthalmology

## 2022-06-03 DIAGNOSIS — E113511 Type 2 diabetes mellitus with proliferative diabetic retinopathy with macular edema, right eye: Secondary | ICD-10-CM

## 2022-06-03 DIAGNOSIS — S0591XA Unspecified injury of right eye and orbit, initial encounter: Secondary | ICD-10-CM

## 2022-06-03 DIAGNOSIS — I10 Essential (primary) hypertension: Secondary | ICD-10-CM

## 2022-06-03 DIAGNOSIS — H25813 Combined forms of age-related cataract, bilateral: Secondary | ICD-10-CM

## 2022-06-03 DIAGNOSIS — H35033 Hypertensive retinopathy, bilateral: Secondary | ICD-10-CM

## 2022-06-04 NOTE — Progress Notes (Addendum)
[facility] Note  06/10/2022    CHIEF COMPLAINT Patient presents for Retina Follow Up  HISTORY OF PRESENT ILLNESS: Earl Calderon is a 71 y.o. male who presents to the clinic today for:   HPI     Retina Follow Up   Patient presents with  Other.  In both eyes.  This started 9 days ago.  Severity is moderate.  Duration of 9 days.  Since onset it is stable.  I, the attending physician,  performed the HPI with the patient and updated documentation appropriately.        Comments   Pt is reporting cloudy vision and thinks its time to have his cataracts removed he denies flashes or floaters       Last edited by [name], MD on 06/10/2022  3:54 PM.    Pt would like to see about having his cataracts removed  Referring physician: Debbra Riding, MD [address] [place],  [place] [address]  HISTORICAL INFORMATION:   Selected notes from the MEDICAL RECORD NUMBER Referred by Dr. [name] for severe NPDR OU   CURRENT MEDICATIONS: No current outpatient medications on file. (Ophthalmic Drugs)   No current facility-administered medications for this visit. (Ophthalmic Drugs)   Current Outpatient Medications (Other)  Medication Sig   APPLE CIDER VINEGAR PO Take 1 tablet by mouth daily at 6 (six) AM.   ASPIRIN LOW DOSE 81 MG EC tablet TAKE 1 TABLET BY MOUTH ONCE DAILY (SWALLOW WHOLE)   carvedilol (COREG) 12.5 MG tablet TAKE ONE TABLET BY MOUTH TWICE DAILY WITH A MEAL   ELDERBERRY PO Take 100 mg by mouth daily.   ENTRESTO 97-103 MG TAKE 1 TABLET BY MOUTH TWICE DAILY   ferrous sulfate 325 (65 FE) MG tablet Take 1 tablet (325 mg total) by mouth daily with breakfast.   furosemide (LASIX) 40 MG tablet TAKE 1 TABLET BY MOUTH ONCE DAILY   glucose blood (FREESTYLE TEST STRIPS) test strip Use as instructed   hydrALAZINE (APRESOLINE) 25 MG tablet TAKE 1 TABLET BY MOUTH IN THE MORNING, AND TAKE 1 TABLET AT BEDTIME   insulin aspart (NOVOLOG FLEXPEN)  100 UNIT/ML FlexPen Before each meal 3 times a day, 140-199 - 2 units, 200-250 - 4 units, 251-299 - 6 units,  300-349 - 8 units,  350 or above 10 units. Insulin PEN if approved, provide syringes and needles if needed.   Insulin Pen Needle (NOVOFINE PEN NEEDLE) 32G X 6 MM MISC USE AS DIRECTED   JARDIANCE 10 MG TABS tablet TAKE ONE TABLET BY MOUTH DAILY BEFORE BREAKFAST   LEVEMIR FLEXTOUCH 100 UNIT/ML FlexPen Inject 8 Units into the skin 2 (two) times daily.   nitroGLYCERIN (NITROSTAT) 0.4 MG SL tablet Place 1 tablet (0.4 mg total) under the tongue every 5 (five) minutes as needed for chest pain.   Probiotic Product (PROBIOTIC MULTI-ENZYME) TABS Take 3 tablets by mouth daily at 6 (six) AM.   rosuvastatin (CRESTOR) 40 MG tablet TAKE 1 TABLET BY MOUTH ONCE DAILY AT 6PM   spironolactone (ALDACTONE) 25 MG tablet TAKE 1 TABLET BY MOUTH ONCE DAILY   Turmeric 500 MG TABS Take 2 tablets by mouth daily at 6 (six) AM.   No current facility-administered medications for this visit. (Other)   REVIEW OF SYSTEMS:   ALLERGIES No Known Allergies  PAST MEDICAL HISTORY Past Medical History:  Diagnosis Date   CAD in native artery 01/01/2021   Prior LAD PCI.  Currently  no symptoms of ischemia.  Encouraged him to do some light exercising.  Continue aspirin, carvedilol, and rosuvastatin.   Cataract    CKD (chronic kidney disease), stage III ([place]) 08/24/2020   Coronary artery disease    Diabetes mellitus without complication ([place])    on meds   Diabetic retinopathy ([place])    Goals of care, counseling/discussion 07/01/2021   Hernia, inguinal    Hypertension    on meds   Hypertensive retinopathy    Lymphadenopathy, cervical 07/01/2021   MGUS (monoclonal gammopathy of unknown significance) 07/01/2021   NSVT (nonsustained ventricular tachycardia) ([place]) 08/24/2020   Pure hypercholesterolemia 08/05/2021   Uncontrolled type 2 diabetes mellitus 11/11/2018   Past Surgical History:  Procedure Laterality Date    CORONARY STENT INTERVENTION N/A 11/15/2018   Procedure: CORONARY STENT INTERVENTION;  Surgeon: [name], MD;  Location: [facility];  Service: Cardiovascular;  Laterality: N/A;   HEMORROIDECTOMY     RIGHT HEART CATH N/A 07/18/2020   Procedure: RIGHT HEART CATH;  Surgeon: [name], MD;  Location: [facility];  Service: Cardiovascular;  Laterality: N/A;   RIGHT/LEFT HEART CATH AND CORONARY ANGIOGRAPHY N/A 11/15/2018   Procedure: RIGHT/LEFT HEART CATH AND CORONARY ANGIOGRAPHY;  Surgeon: [name], MD;  Location: [facility];  Service: Cardiovascular;  Laterality: N/A;   WISDOM TOOTH EXTRACTION     FAMILY HISTORY Family History  Problem Relation Age of Onset   Hypertension Mother    Diabetes Mellitus II Mother    Arrhythmia Mother    Heart disease Mother    Hypertension Sister    Hypertension Brother    Heart disease Brother    Kidney disease Brother    Hypertension Other    Diabetes Mellitus II Other        All 9 sibblings have HTN   Colon cancer Neg Hx    Colon polyps Neg Hx    Esophageal cancer Neg Hx    Stomach cancer Neg Hx    Rectal cancer Neg Hx    SOCIAL HISTORY Social History   Tobacco Use   Smoking status: Never   Smokeless tobacco: Never  Vaping Use   Vaping Use: Never used  Substance Use Topics   Alcohol use: Not Currently   Drug use: Never       OPHTHALMIC EXAM:  Base Eye Exam     Visual Acuity (Snellen - Linear)       Right Left   Dist [place] 20/40 20/80   Dist ph [place] NI 20/60 +2         Tonometry (Tonopen, 2:09 PM)       Right Left   Pressure 14 15         Pupils       Pupils Dark Light Shape React APD   Right PERRL 3 2 Round Sluggish None   Left PERRL 3 2 Round Sluggish None         Visual Fields       Left Right   Restrictions Partial outer superior temporal deficiency Partial outer superior nasal deficiency         Extraocular Movement       Right Left    Full Full          Neuro/Psych     Oriented x3: Yes   Mood/Affect: Normal         Dilation     Both eyes: 1.0% Mydriacyl, 2.5% Phenylephrine @ 2:09 PM  Slit Lamp and Fundus Exam     Slit Lamp Exam       Right Left   Lids/Lashes Dermatochalasis - upper lid Dermatochalasis - upper lid   Conjunctiva/Sclera Melanosis Melanosis   Cornea arcus, fine endo pigment arcus   Anterior Chamber deep and clear, no cell or flare deep and clear   Iris Round and dilated, No NVI Round and dilated, No NVI   Lens 2-3+ Nuclear sclerosis, 2-3+ Cortical cataract 2-3+ Nuclear sclerosis, 2-3+ Cortical cataract   Anterior Vitreous Vitreous syneresis Vitreous syneresis         Fundus Exam       Right Left   Disc Pink and Sharp, no NVD, +PPP Pink and Sharp, no NVD, +PPP   C/D Ratio 0.6 0.4   Macula good foveal reflex, central edema -- slightly improved, scattered MA/DBH - improved, focal exudates temporal macula - improved good foveal reflex, scattered MA/DBH, +edema temporal macula -- slightly improved, +exudates and CWS -- improved   Vessels attenuated, tortuous, focal NV IN arcades--regressing, mild copper wiring attenuated, Tortuous   Periphery Attached, 360 MA/DBH greatest posteriorly, light 360 PRP changes with room for fill in if needed Attached, 360 DBH           IMAGING AND PROCEDURES  Imaging and Procedures for 06/10/2022  OCT, Retina - OU - Both Eyes       Right Eye Quality was good. Central Foveal Thickness: 297. Progression has improved. Findings include no SRF, abnormal foveal contour, intraretinal hyper-reflective material, intraretinal fluid, vitreomacular adhesion (interval improvement in central IRF and foveal contour, partial PVD).   Left Eye Quality was good. Central Foveal Thickness: 209. Progression has been stable. Findings include normal foveal contour, no SRF, intraretinal hyper-reflective material, intraretinal fluid, vitreomacular adhesion (persistent IRF temporal  fovea and macula, patchy ORA).   Notes *Images captured and stored on drive  Diagnosis / Impression:  +DME OU OD: interval improvement in central IRF and foveal contour, partial PVD OS: persistent IRF temporal fovea and macula, patchy ORA  Clinical management:  See below  Abbreviations: NFP - Normal foveal profile. CME - cystoid macular edema. PED - pigment epithelial detachment. IRF - intraretinal fluid. SRF - subretinal fluid. EZ - ellipsoid zone. ERM - epiretinal membrane. ORA - outer retinal atrophy. ORT - outer retinal tubulation. SRHM - subretinal hyper-reflective material. IRHM - intraretinal hyper-reflective material      Intravitreal Injection, Pharmacologic Agent - OD - Right Eye       Time Out 06/10/2022. 2:41 PM. Confirmed correct patient, procedure, site, and patient consented.   Anesthesia Topical anesthesia was used. Anesthetic medications included Lidocaine 2%, Proparacaine 0.5%.   Procedure Preparation included 5% betadine to ocular surface, eyelid speculum. A supplied (32g) needle was used.   Injection: 1.25 mg Bevacizumab 1.'25mg'$ /0.24m   Route: Intravitreal, Site: Right Eye   NDC: [id number] Lot: [id number] , Expiration date: 07/13/2022   Post-op Post injection exam found visual acuity of at least counting fingers. The patient tolerated the procedure well. There were no complications. The patient received written and verbal post procedure care education. Post injection medications were not given.      Intravitreal Injection, Pharmacologic Agent - OS - Left Eye       Time Out 06/10/2022. 2:42 PM. Confirmed correct patient, procedure, site, and patient consented.   Anesthesia Topical anesthesia was used. Anesthetic medications included Lidocaine 2%, Proparacaine 0.5%.   Procedure Preparation included 5% betadine to ocular surface, eyelid speculum. A (32g) needle was used.  Injection: 1.25 mg Bevacizumab 1.'25mg'$ /0.67m   Route: Intravitreal,  Site: Left Eye   NDC: [id number] Lot:: [id number] Expiration date: 07/16/2022   Post-op Post injection exam found visual acuity of at least counting fingers. The patient tolerated the procedure well. There were no complications. The patient received written and verbal post procedure care education. Post injection medications were not given.            ASSESSMENT/PLAN:    ICD-10-CM   1. Proliferative diabetic retinopathy of right eye with macular edema associated with type 2 diabetes mellitus (HCC)  E11.3511 OCT, Retina - OU - Both Eyes    Intravitreal Injection, Pharmacologic Agent - OD - Right Eye    Bevacizumab (AVASTIN) SOLN 1.25 mg    2. Severe nonproliferative diabetic retinopathy of left eye with macular edema associated with type 2 diabetes mellitus (HCC)  EL93.7902Intravitreal Injection, Pharmacologic Agent - OS - Left Eye    Bevacizumab (AVASTIN) SOLN 1.25 mg    3. Essential hypertension  I10     4. Hypertensive retinopathy of both eyes  H35.033     5. Combined forms of age-related cataract of both eyes  H25.813      1,2. Proliferative diabetic retinopathy, OD  - severe NPDR OS  - delayed f/u today -- 7 wks instead of 6 due to transportation - delayed to follow up from 10.28.22 to 01.04.23 (9+ wks) - s/p IVA OU #1 (06.06.22), #2 (07.08.22), #3 (08.05.22), #4 (09.02.22), #5 (09.30.22), #6 (10.28.22), #7 (01.04.23), #8 (02.20.33), #9 (04.03.23), #10 (05.15.23), #11 (06.28.23), #12 (08.16.23) **h/o increased IRF/DME OU at 7 wks, noted on 08.16.23**  - s/p PRP OD (06.10.22) - exam shows central edema OU, scattered IRH -- improving - FA (06.06.22) shows OD: focal NVE IN arcades; OS: no NV OS; late leaking MA's OU -- s/p PRP OD 6.10.22 - OCT shows OD: interval improvement in central IRF and foveal contour, partial PVD; OS: persistent IRF temporal fovea and macula, patchy ORA - BCVA -- OD 20/40 (improved); OS 20/60 (improved) - recommend IVA OU #13 today, 10.10.23 w/ f/u in  5-6 wks - pt wishes to proceed - RBA of procedure discussed, questions answered - IVA informed consent obtained and re-signed (OU), 06.28.23 - see procedure note - f/u 5-6 weeks, DFE/OCT, possible injection(s)  3,4. Hypertensive retinopathy OU - discussed importance of tight BP control - monitor  5. Mixed Cataract OU - The symptoms of cataract, surgical options, and treatments and risks were discussed with patient. - discussed diagnosis and progression - under the expert management of Dr. SWyatt Portela- clear from a retina standpoint to proceed with cataract surgery when both patient and surgeon are ready - pt requests re-referral to Dr. GKaty Fitchfor f/u -- will help facilitate  Ophthalmic Meds Ordered this visit:  Meds ordered this encounter  Medications   Bevacizumab (AVASTIN) SOLN 1.25 mg   Bevacizumab (AVASTIN) SOLN 1.25 mg     Return for f/u 5-6 weeks, PDR OD, DFE, OCT.  There are no Patient Instructions on file for this visit.  This document serves as a record of services personally performed by BGardiner Sleeper MD, PhD. It was created on their behalf by CRenaldo Reel CKilgorean ophthalmic technician. The creation of this record is the provider's dictation and/or activities during the visit.    Electronically signed by:  CRenaldo Reel COT  10.04.23 3:59 PM  This document serves as a record of services personally performed by BGardiner Sleeper MD, PhD. It  was created on their behalf by San Jetty. Owens Shark, OA an ophthalmic technician. The creation of this record is the provider's dictation and/or activities during the visit.    Electronically signed by: San Jetty. [place], [place] 10.10.2023 3:59 PM  Gardiner Sleeper, M.D., Ph.D. Diseases & Surgery of the Retina and Vitreous [facility]  I have reviewed the above documentation for accuracy and completeness, and I agree with the above. Gardiner Sleeper, M.D., Ph.D. 06/10/22 3:59 PM  Abbreviations: M myopia  (nearsighted); A astigmatism; H hyperopia (farsighted); P presbyopia; Mrx spectacle prescription;  CTL contact lenses; OD right eye; OS left eye; OU both eyes  XT exotropia; ET esotropia; PEK punctate epithelial keratitis; PEE punctate epithelial erosions; DES dry eye syndrome; MGD meibomian gland dysfunction; ATs artificial tears; PFAT's preservative free artificial tears; [place] nuclear sclerotic cataract; PSC posterior subcapsular cataract; ERM epi-retinal membrane; PVD posterior vitreous detachment; RD retinal detachment; DM diabetes mellitus; DR diabetic retinopathy; NPDR non-proliferative diabetic retinopathy; PDR proliferative diabetic retinopathy; CSME clinically significant macular edema; DME diabetic macular edema; dbh dot blot hemorrhages; CWS cotton wool spot; POAG primary open angle glaucoma; C/D cup-to-disc ratio; HVF humphrey visual field; GVF goldmann visual field; OCT optical coherence tomography; IOP intraocular pressure; BRVO Branch retinal vein occlusion; CRVO central retinal vein occlusion; CRAO central retinal artery occlusion; BRAO branch retinal artery occlusion; RT retinal tear; SB scleral buckle; PPV pars plana vitrectomy; VH Vitreous hemorrhage; PRP panretinal laser photocoagulation; IVK intravitreal kenalog; VMT vitreomacular traction; MH Macular hole;  NVD neovascularization of the disc; NVE neovascularization elsewhere; AREDS age related eye disease study; ARMD age related macular degeneration; POAG primary open angle glaucoma; EBMD epithelial/anterior basement membrane dystrophy; ACIOL anterior chamber intraocular lens; IOL intraocular lens; PCIOL posterior chamber intraocular lens; Phaco/IOL phacoemulsification with intraocular lens placement; [place] photorefractive keratectomy; LASIK laser assisted in situ keratomileusis; HTN hypertension; DM diabetes mellitus; COPD chronic obstructive pulmonary disease

## 2022-06-08 IMAGING — CT CT CHEST-ABD-PELV W/ CM
2 of 5 series · 13 of 36 positions shown, 15 images · IV contrast (Omnipaque)
Comparison: None.

CLINICAL DATA: Right supraclavicular mass.

EXAM:
CT CHEST, ABDOMEN, AND PELVIS WITH CONTRAST
TECHNIQUE: Multidetector CT imaging of the chest, abdomen and pelvis was
performed following the standard protocol during bolus
administration of intravenous contrast.
CONTRAST:  100mL OMNIPAQUE IOHEXOL 300 MG/ML  SOLN

[Series 3: cap with 2 · axial · 0.78mm/px · z∈[-833,-238]mm · 10 of 147 slices shown, 12 images]
[im 14/147  mediastinal]
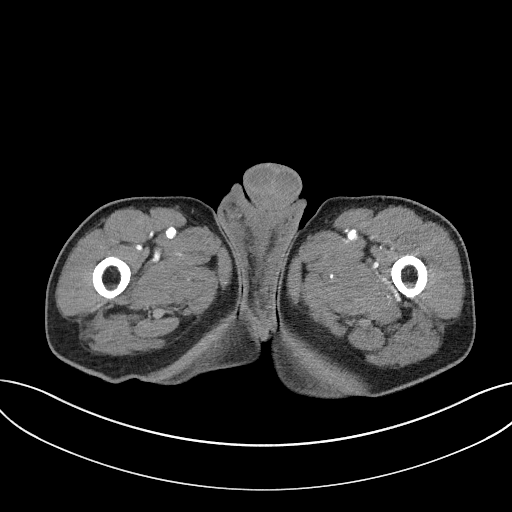
[im 14/147  bone]
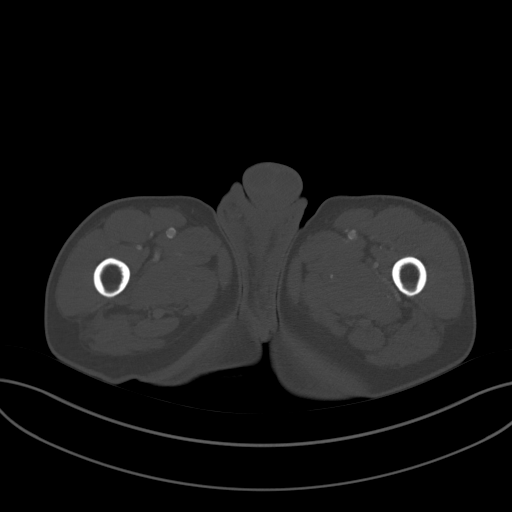
[im 27/147  mediastinal]
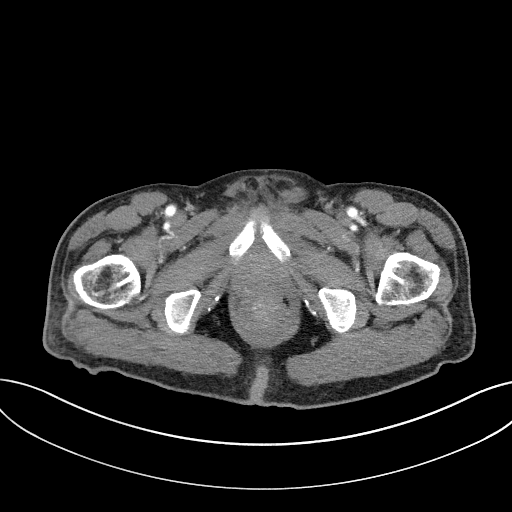
[im 40/147  mediastinal]
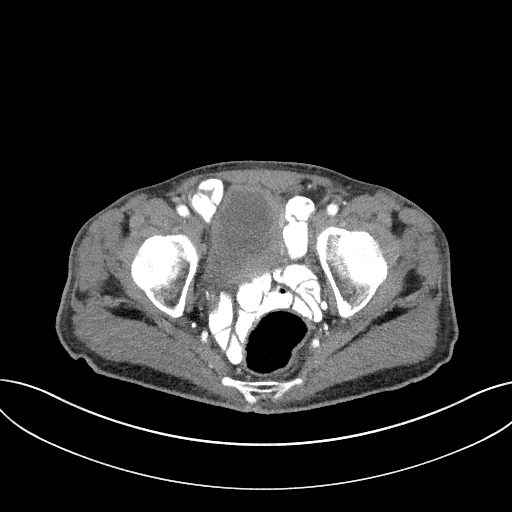
[im 54/147  mediastinal]
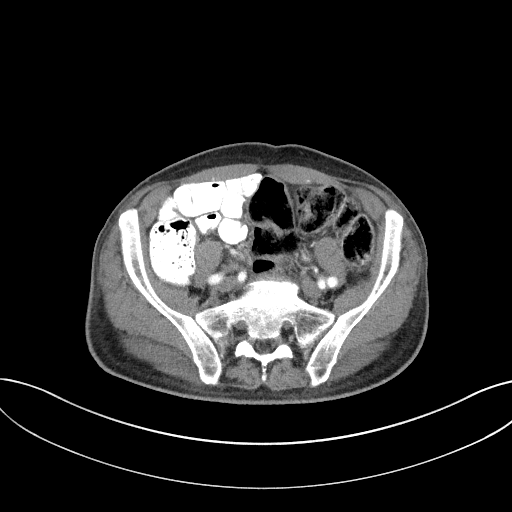
[im 67/147  mediastinal]
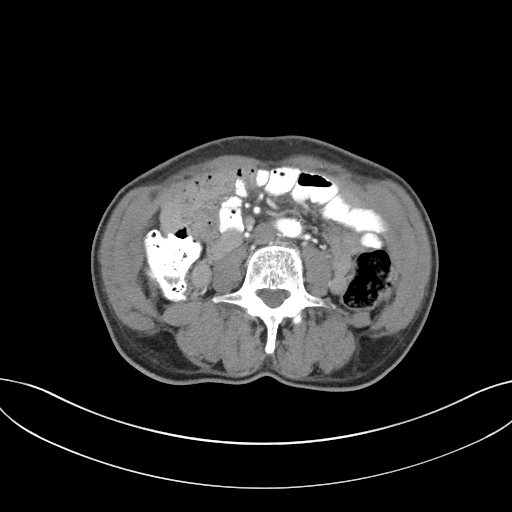
[im 80/147  mediastinal]
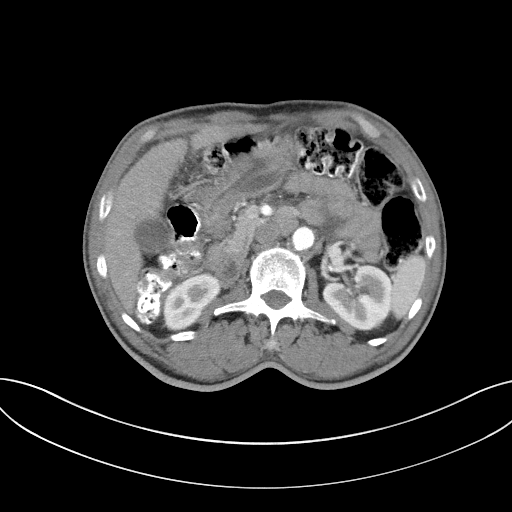
[im 93/147  mediastinal]
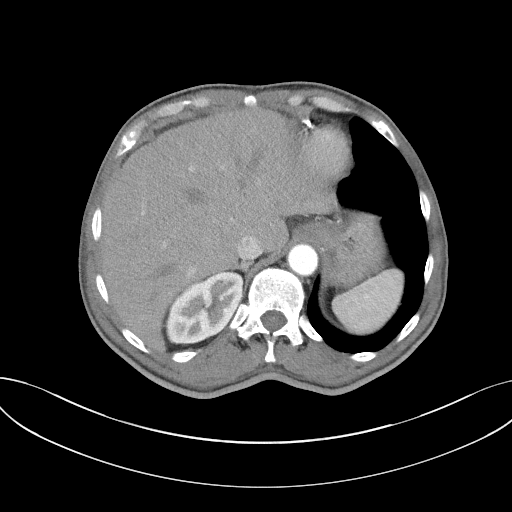
[im 107/147  mediastinal]
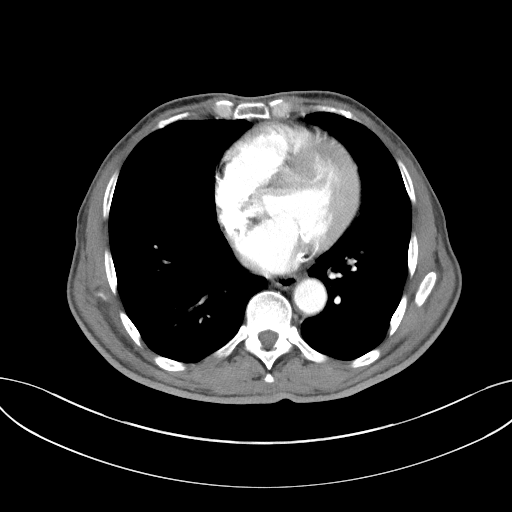
[im 120/147  mediastinal]
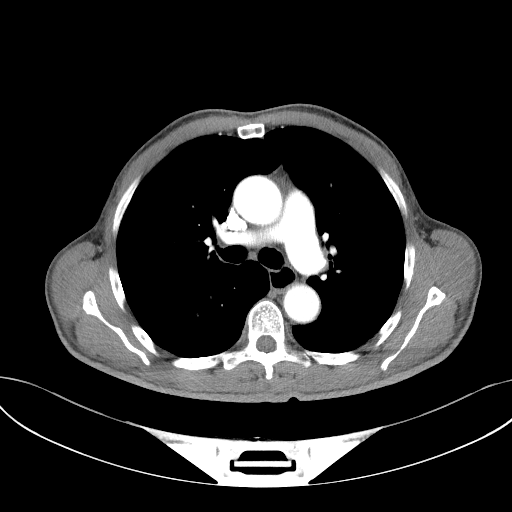
[im 120/147  bone]
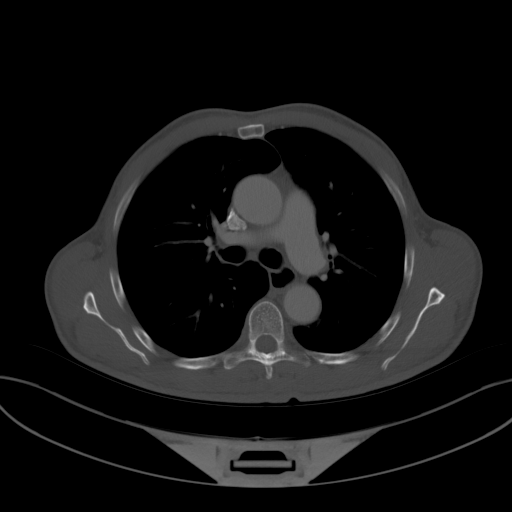
[im 133/147  mediastinal]
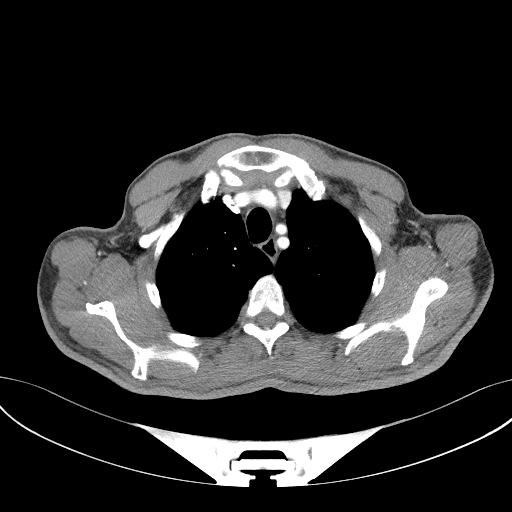

[Series 6: coronals · coronal · 0.85mm/px · 3 of 130 slices shown]
[im 26/130  mediastinal]
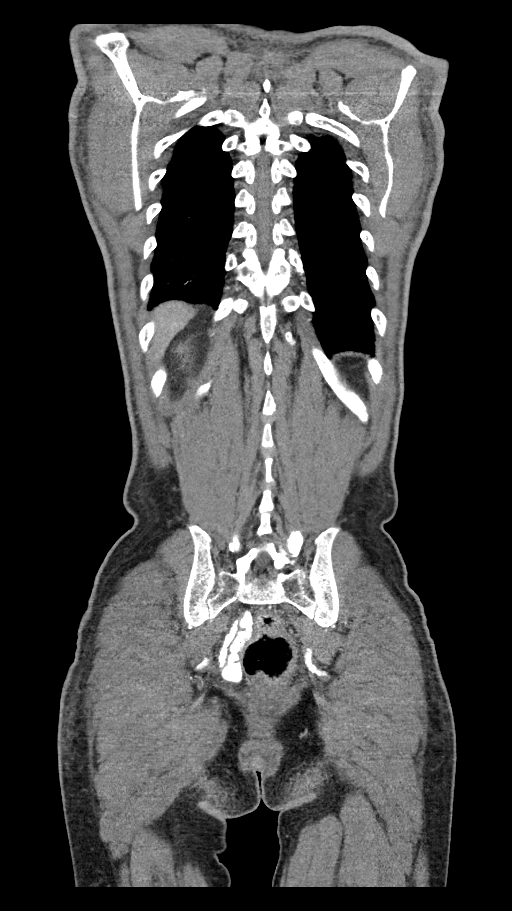
[im 52/130  mediastinal]
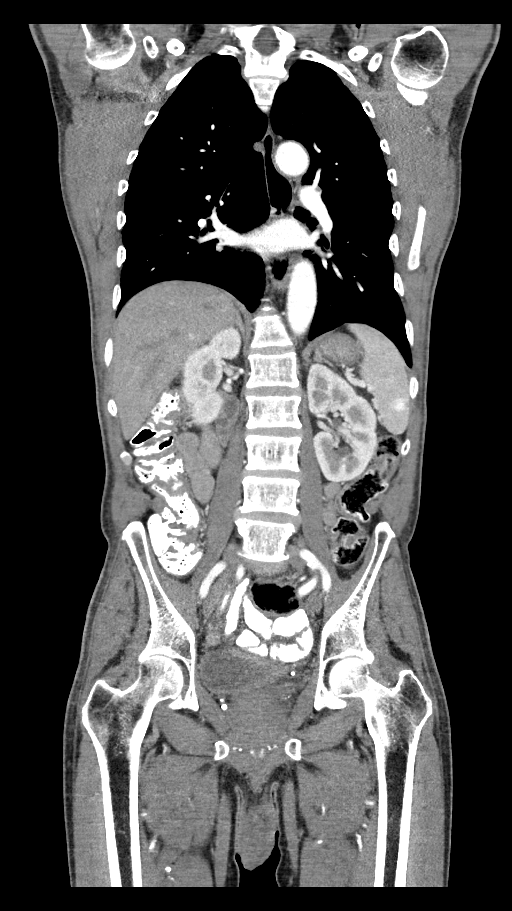
[im 78/130  mediastinal]
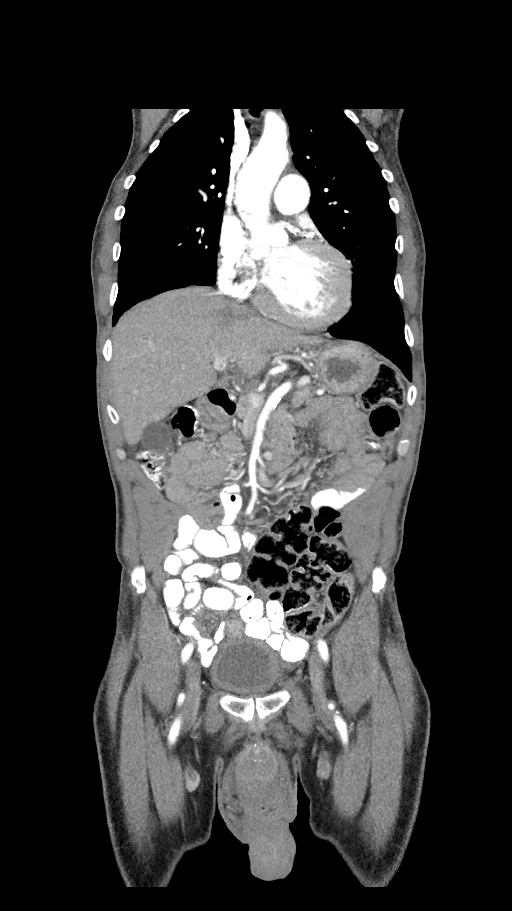

[13 of 36 positions shown; findings below may reference images not displayed]

FINDINGS: CT CHEST FINDINGS

Cardiovascular: The heart size is normal. No substantial pericardial
effusion. Advanced coronary artery calcification noted. Mild
atherosclerotic calcification is noted in the wall of the thoracic
aorta.

Mediastinum/Nodes: No mediastinal lymphadenopathy. There is no hilar
lymphadenopathy. The esophagus has normal imaging features. There is
no axillary lymphadenopathy.

Lungs/Pleura: 2 mm right middle lobe nodule identified on image 90
of series 5. 4 mm perifissural right lower lobe nodule identified on
79/5. No focal airspace consolidation. No pleural effusion.

Musculoskeletal: No worrisome lytic or sclerotic osseous
abnormality.

CT ABDOMEN PELVIS FINDINGS

Hepatobiliary: 10 mm low-density lesion posterior right liver on
65/3 cannot be definitively characterized but is likely benign
cm lesion in the lateral segment left liver shows peripheral
enhancement with a segmented linear pattern (57/3 and 95/7) and
filling in of enhancement on delayed imaging. Imaging
characteristics suggests suggestive of cavernous hemangioma. There
is no evidence for gallstones, gallbladder wall thickening, or
pericholecystic fluid. No intrahepatic or extrahepatic biliary
dilation.

Pancreas: No focal mass lesion. No dilatation of the main duct. No
intraparenchymal cyst. No peripancreatic edema.

Spleen: 12 mm hypervascular lesion in the spleen cannot be
definitively characterized but is likely benign and may be a
hemangioma.

Adrenals/Urinary Tract: No adrenal nodule or mass. Kidneys
unremarkable. No evidence for hydroureter. The urinary bladder
appears normal for the degree of distention.

Stomach/Bowel: Stomach is unremarkable. No gastric wall thickening.
No evidence of outlet obstruction. Duodenum is normally positioned
as is the ligament of Treitz. No small bowel wall thickening. No
small bowel dilatation. The terminal ileum is normal. The appendix
is not well visualized, but there is no edema or inflammation in the
region of the cecum. No gross colonic mass. No colonic wall
thickening.

Vascular/Lymphatic: There is mild atherosclerotic calcification of
the abdominal aorta without aneurysm. There is no gastrohepatic or
hepatoduodenal ligament lymphadenopathy. No retroperitoneal or
mesenteric lymphadenopathy. No pelvic sidewall lymphadenopathy.

Reproductive: The prostate gland and seminal vesicles are
unremarkable.

Other: No intraperitoneal free fluid.

Musculoskeletal: Small left groin hernia contains only fat. No
worrisome lytic or sclerotic osseous abnormality.
IMPRESSION: 1. No evidence for lymphadenopathy in the chest, abdomen, or pelvis.
[DATE] cm lesion in the lateral segment left liver shows peripheral
enhancement and filling in delayed imaging. Imaging characteristics
suggest cavernous hemangioma but are not entirely characteristic.
MRI abdomen without and with contrast recommended to further
evaluate.
3. 12 mm hypervascular lesion in the spleen cannot be definitively
characterized but is likely benign and may be a hemangioma.
Attention on follow-up recommended.
4. 2 mm right middle lobe and 4 mm perifissural right lower lobe
pulmonary nodules. No follow-up needed if patient is low-risk (and
has no known or suspected primary neoplasm). Non-contrast chest CT
can be considered in 12 months if patient is high-risk. This
recommendation follows the consensus statement: Guidelines for
Management of Incidental Pulmonary Nodules Detected on CT Images:
5. Small left groin hernia contains only fat.
6. Aortic Atherosclerosis ([id number].X).

## 2022-06-08 IMAGING — CT CT NECK W/ CM
3 of 5 series · 12 of 33 positions shown, 14 images · IV contrast (Omnipaque)
Comparison: Soft tissue ultrasound 04/10/2021

CLINICAL DATA: Lymphadenopathy in the neck with supraclavicular
adenopathy by ultrasound.

EXAM:
CT NECK WITH CONTRAST
TECHNIQUE: Multidetector CT imaging of the neck was performed using the
standard protocol following the bolus administration of intravenous
contrast.
CONTRAST:  100mL OMNIPAQUE IOHEXOL 300 MG/ML  SOLN

[Series 7: sag neck · sagittal · 0.44mm/px · 5 of 81 slices shown, 6 images]
[im 27/81  bone]
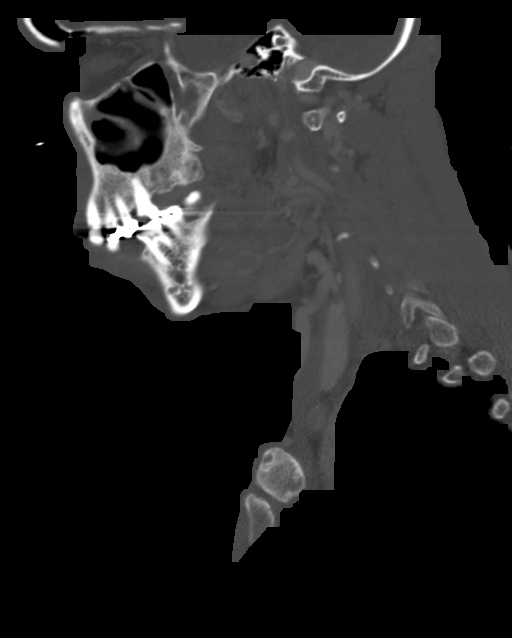
[im 34/81  bone]
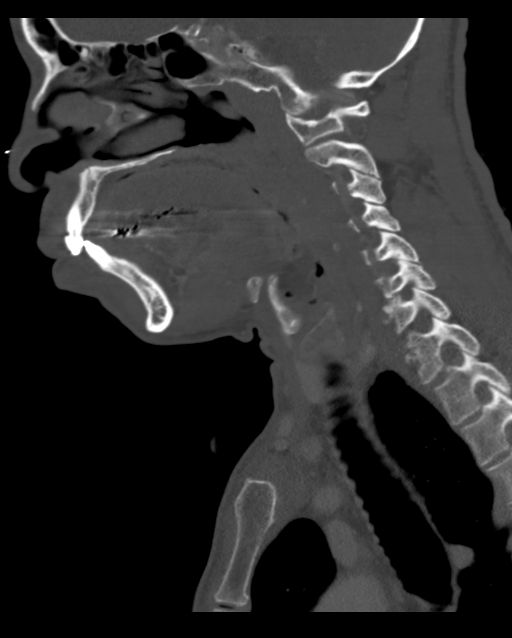
[im 41/81  soft-tissue]
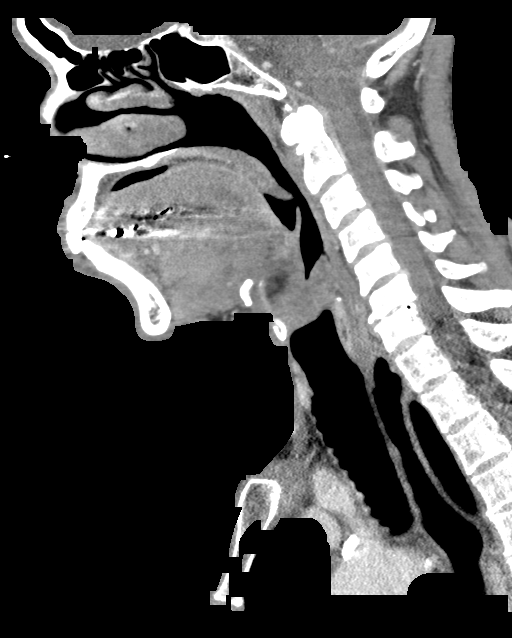
[im 41/81  bone]
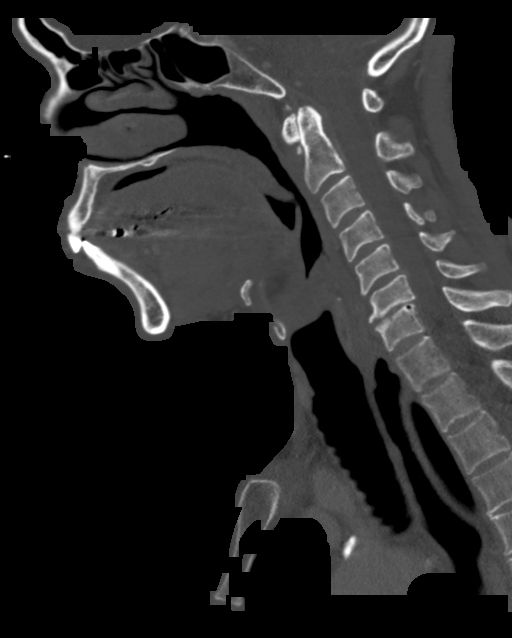
[im 47/81  bone]
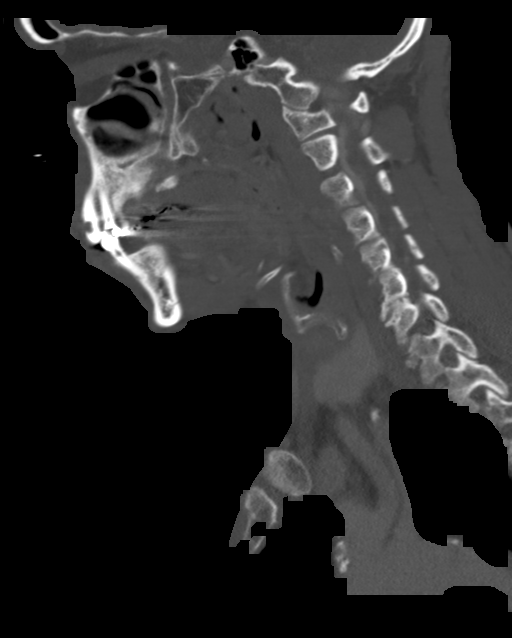
[im 54/81  bone]
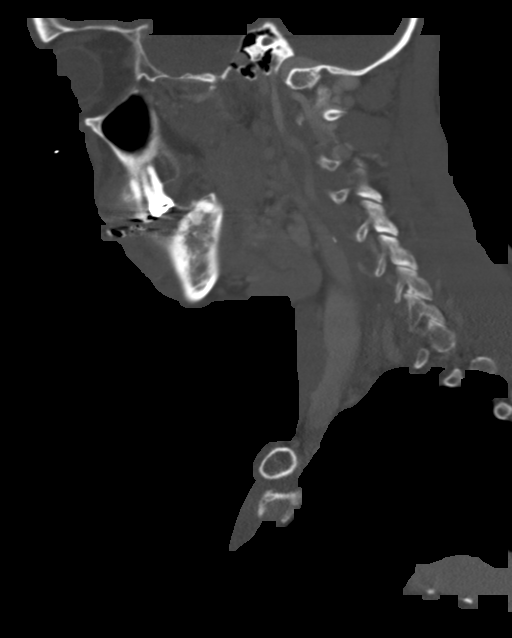

[Series 8: cor neck · coronal · 0.34mm/px · 3 of 67 slices shown]
[im 14/67  bone]
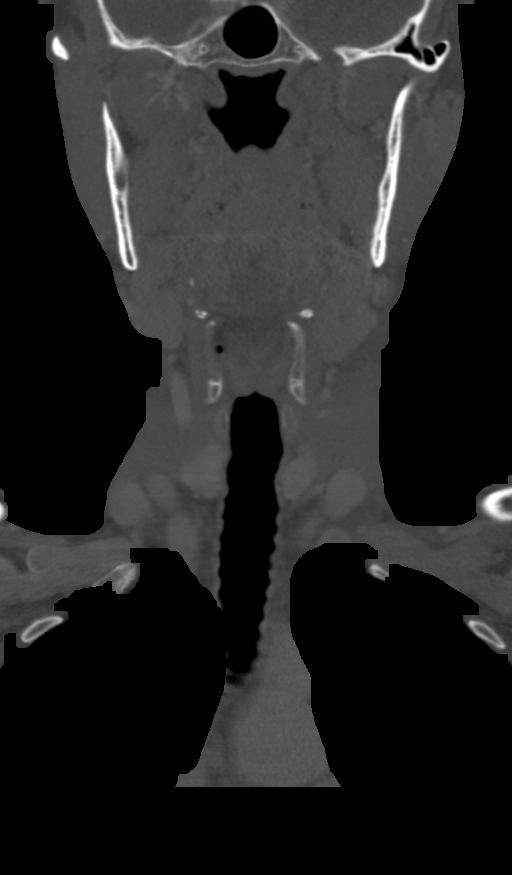
[im 27/67  bone]
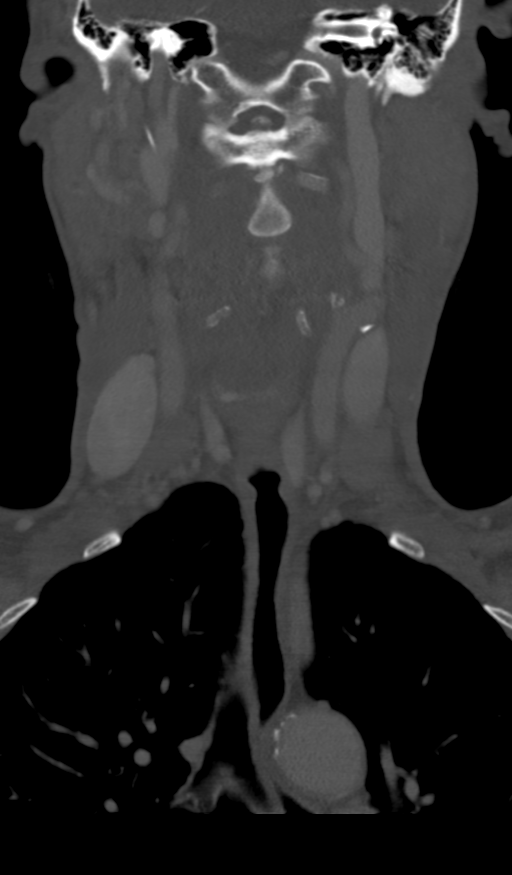
[im 40/67  bone]
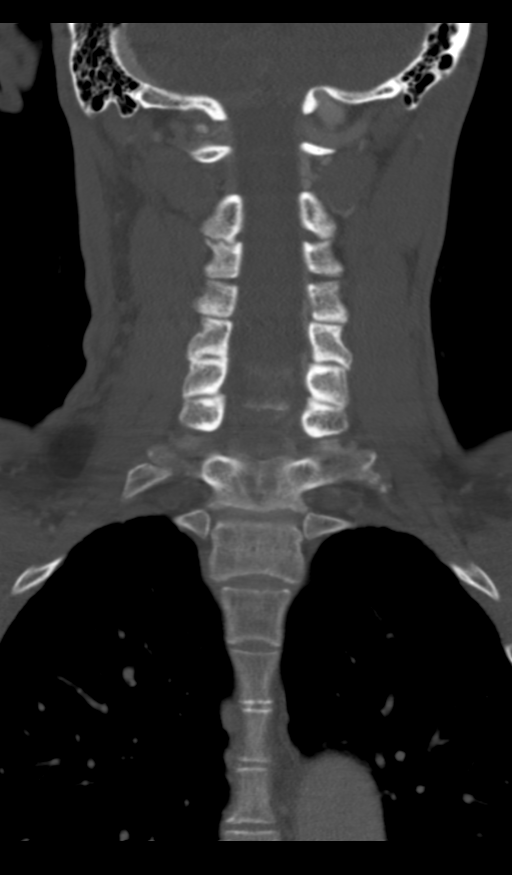

[Series 9: orthogonal ax · axial · 0.30mm/px · z∈[-302,-91]mm · 4 of 173 slices shown, 5 images]
[im 29/173  soft-tissue]
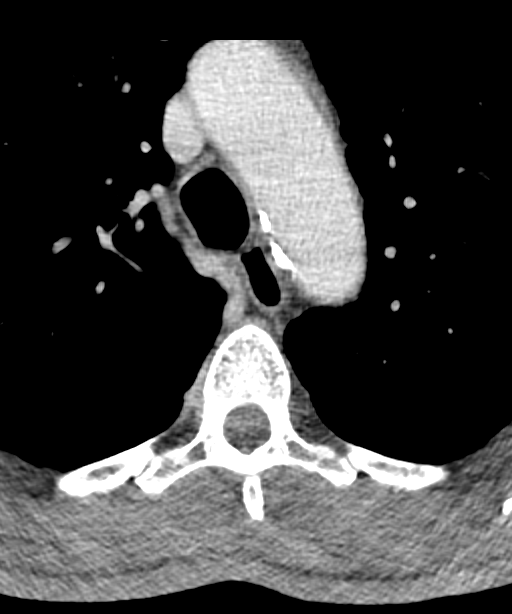
[im 29/173  bone]
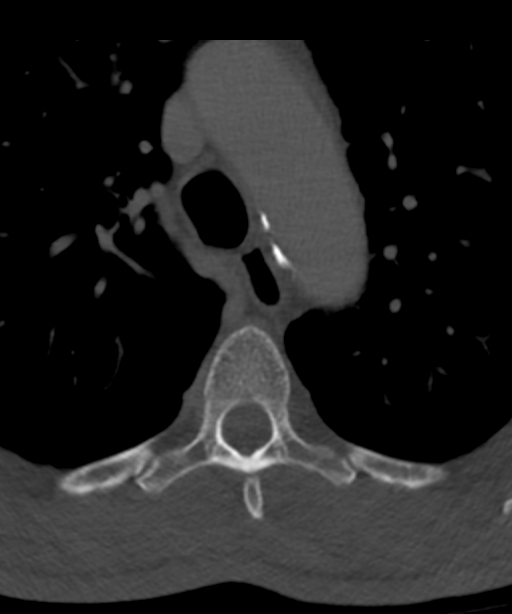
[im 58/173  bone]
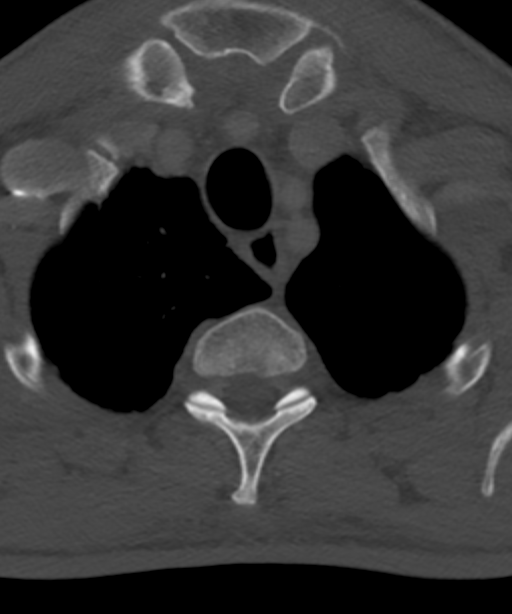
[im 115/173  bone]
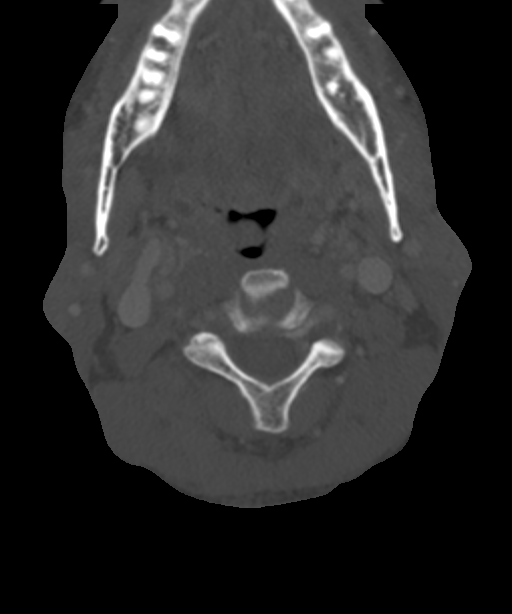
[im 144/173  bone]
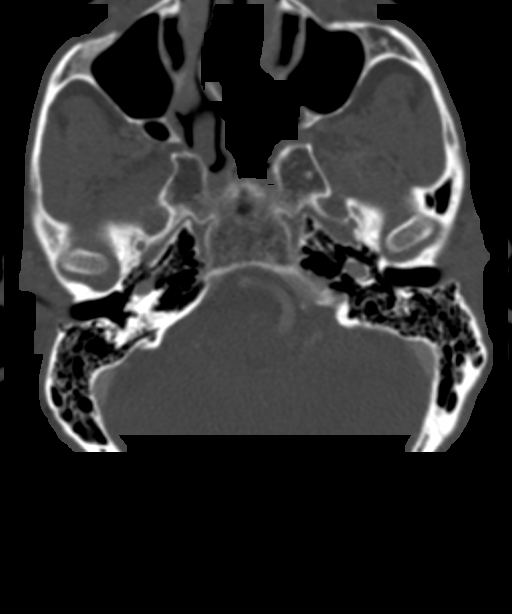

[12 of 33 positions shown; findings below may reference images not displayed]

FINDINGS: Pharynx and larynx: Retention cyst appearance at the right tonsillar
fossa. Closed glottis at time of imaging with some adjacent
secretions. No detected mass.

Salivary glands: Symmetric and negative for mass or visible
inflammation.

Thyroid: Normal

Lymph nodes: None enlarged or abnormal density. The right
supraclavicular finding on prior ultrasound is fat density at 18 mm,
consistent with simple lipoma.

Vascular: Atheromatous calcifications especially affecting the
carotids and aorta.

Limited intracranial: Negative

Visualized orbits: Negative

Mastoids and visualized paranasal sinuses: Prior blowout fracture
affecting the right lamina papyracea. No active sinusitis.

Skeleton: No acute or aggressive finding. No lytic lesion to
correlate with history of MGUS.

Upper chest: Clear apical lungs.
IMPRESSION: Benign neck CT. The right supraclavicular mass reflects a 18 mm
simple lipoma.

## 2022-06-10 ENCOUNTER — Encounter (INDEPENDENT_AMBULATORY_CARE_PROVIDER_SITE_OTHER): Payer: Self-pay | Admitting: Ophthalmology

## 2022-06-10 ENCOUNTER — Ambulatory Visit (INDEPENDENT_AMBULATORY_CARE_PROVIDER_SITE_OTHER): Payer: Medicare Other | Admitting: Ophthalmology

## 2022-06-10 DIAGNOSIS — H35033 Hypertensive retinopathy, bilateral: Secondary | ICD-10-CM | POA: Diagnosis not present

## 2022-06-10 DIAGNOSIS — I10 Essential (primary) hypertension: Secondary | ICD-10-CM | POA: Diagnosis not present

## 2022-06-10 DIAGNOSIS — E113412 Type 2 diabetes mellitus with severe nonproliferative diabetic retinopathy with macular edema, left eye: Secondary | ICD-10-CM

## 2022-06-10 DIAGNOSIS — E113511 Type 2 diabetes mellitus with proliferative diabetic retinopathy with macular edema, right eye: Secondary | ICD-10-CM

## 2022-06-10 DIAGNOSIS — S0591XA Unspecified injury of right eye and orbit, initial encounter: Secondary | ICD-10-CM

## 2022-06-10 DIAGNOSIS — H25813 Combined forms of age-related cataract, bilateral: Secondary | ICD-10-CM

## 2022-06-10 MED ORDER — BEVACIZUMAB CHEMO INJECTION 1.25MG/0.05ML SYRINGE FOR KALEIDOSCOPE
1.2500 mg | INTRAVITREAL | Status: AC | PRN
  Administered 2022-06-10: 1.25 mg via INTRAVITREAL

## 2022-06-12 ENCOUNTER — Telehealth (HOSPITAL_BASED_OUTPATIENT_CLINIC_OR_DEPARTMENT_OTHER): Payer: Self-pay | Admitting: *Deleted

## 2022-06-12 NOTE — Telephone Encounter (Signed)
Received prior authorization request on Novolog insulin from pharmacy, Rx even had PCP Dr Carlis Abbott listed Called pharmacy and left message this will need to be obtained from PCP, the one who filled last Rx Plus this is not a cardiac medication

## 2022-06-30 ENCOUNTER — Telehealth: Payer: Self-pay | Admitting: *Deleted

## 2022-06-30 NOTE — Telephone Encounter (Signed)
Per staff message Sheria Lang per Dr. Marin Olp - called patient and lvm of rescheduling appointment with Dr. Marin Olp - requested callback to confirm.

## 2022-07-01 NOTE — Progress Notes (Addendum)
[facility] Note  07/15/2022    CHIEF COMPLAINT Patient presents for Retina Follow Up  HISTORY OF PRESENT ILLNESS: Earl Calderon is a 71 y.o. male who presents to the clinic today for:   HPI     Retina Follow Up   Patient presents with  Diabetic Retinopathy.  In right eye.  Severity is moderate.  Duration of 5 weeks.  Since onset it is stable.  I, the attending physician,  performed the HPI with the patient and updated documentation appropriately.        Comments   5 week Retina eval Patient states vision not any better it is about the same      Last edited by [name], MD on 07/15/2022 10:47 PM.     Pt states vision the same OU.  Referring physician: Debbra Riding, MD [address] [place],  [place] [address]  HISTORICAL INFORMATION:   Selected notes from the MEDICAL RECORD NUMBER Referred by Dr. [name] for severe NPDR OU   CURRENT MEDICATIONS: No current outpatient medications on file. (Ophthalmic Drugs)   No current facility-administered medications for this visit. (Ophthalmic Drugs)   Current Outpatient Medications (Other)  Medication Sig   APPLE CIDER VINEGAR PO Take 1 tablet by mouth daily at 6 (six) AM.   ASPIRIN LOW DOSE 81 MG EC tablet TAKE 1 TABLET BY MOUTH ONCE DAILY (SWALLOW WHOLE)   carvedilol (COREG) 12.5 MG tablet TAKE ONE TABLET BY MOUTH TWICE DAILY WITH A MEAL   ELDERBERRY PO Take 100 mg by mouth daily.   ENTRESTO 97-103 MG TAKE 1 TABLET BY MOUTH TWICE DAILY   ferrous sulfate 325 (65 FE) MG tablet Take 1 tablet (325 mg total) by mouth daily with breakfast.   furosemide (LASIX) 40 MG tablet TAKE 1 TABLET BY MOUTH ONCE DAILY   glucose blood (FREESTYLE TEST STRIPS) test strip Use as instructed   hydrALAZINE (APRESOLINE) 25 MG tablet TAKE 1 TABLET BY MOUTH IN THE MORNING, AND TAKE 1 TABLET AT BEDTIME   insulin aspart (NOVOLOG FLEXPEN) 100 UNIT/ML FlexPen Before each meal 3 times a day, 140-199 - 2  units, 200-250 - 4 units, 251-299 - 6 units,  300-349 - 8 units,  350 or above 10 units. Insulin PEN if approved, provide syringes and needles if needed.   Insulin Pen Needle (NOVOFINE PEN NEEDLE) 32G X 6 MM MISC USE AS DIRECTED   JARDIANCE 10 MG TABS tablet TAKE ONE TABLET BY MOUTH DAILY BEFORE BREAKFAST   LEVEMIR FLEXTOUCH 100 UNIT/ML FlexPen Inject 8 Units into the skin 2 (two) times daily.   Probiotic Product (PROBIOTIC MULTI-ENZYME) TABS Take 3 tablets by mouth daily at 6 (six) AM.   rosuvastatin (CRESTOR) 40 MG tablet TAKE 1 TABLET BY MOUTH ONCE DAILY AT 6PM   spironolactone (ALDACTONE) 25 MG tablet TAKE 1 TABLET BY MOUTH ONCE DAILY   Turmeric 500 MG TABS Take 2 tablets by mouth daily at 6 (six) AM.   nitroGLYCERIN (NITROSTAT) 0.4 MG SL tablet Place 1 tablet (0.4 mg total) under the tongue every 5 (five) minutes as needed for chest pain.   No current facility-administered medications for this visit. (Other)   REVIEW OF SYSTEMS: ROS   Positive for: Endocrine, Cardiovascular, Eyes Negative for: Constitutional, Gastrointestinal, Neurological, Skin, Genitourinary, Musculoskeletal, HENT, Respiratory, Psychiatric, Allergic/Imm, Heme/Lymph Last edited by Elmore Guise, COT on 07/15/2022  2:03 PM.      ALLERGIES No Known Allergies  PAST  MEDICAL HISTORY Past Medical History:  Diagnosis Date   CAD in native artery 01/01/2021   Prior LAD PCI.  Currently no symptoms of ischemia.  Encouraged him to do some light exercising.  Continue aspirin, carvedilol, and rosuvastatin.   Cataract    CKD (chronic kidney disease), stage III ([place]) 08/24/2020   Coronary artery disease    Diabetes mellitus without complication ([place])    on meds   Diabetic retinopathy ([place])    Goals of care, counseling/discussion 07/01/2021   Hernia, inguinal    Hypertension    on meds   Hypertensive retinopathy    Lymphadenopathy, cervical 07/01/2021   MGUS (monoclonal gammopathy of unknown significance) 07/01/2021    NSVT (nonsustained ventricular tachycardia) ([place]) 08/24/2020   Pure hypercholesterolemia 08/05/2021   Uncontrolled type 2 diabetes mellitus 11/11/2018   Past Surgical History:  Procedure Laterality Date   CORONARY STENT INTERVENTION N/A 11/15/2018   Procedure: CORONARY STENT INTERVENTION;  Surgeon: [name], MD;  Location: [facility];  Service: Cardiovascular;  Laterality: N/A;   HEMORROIDECTOMY     RIGHT HEART CATH N/A 07/18/2020   Procedure: RIGHT HEART CATH;  Surgeon: [name], MD;  Location: [facility];  Service: Cardiovascular;  Laterality: N/A;   RIGHT/LEFT HEART CATH AND CORONARY ANGIOGRAPHY N/A 11/15/2018   Procedure: RIGHT/LEFT HEART CATH AND CORONARY ANGIOGRAPHY;  Surgeon: [name], MD;  Location: [facility];  Service: Cardiovascular;  Laterality: N/A;   WISDOM TOOTH EXTRACTION     FAMILY HISTORY Family History  Problem Relation Age of Onset   Hypertension Mother    Diabetes Mellitus II Mother    Arrhythmia Mother    Heart disease Mother    Hypertension Sister    Hypertension Brother    Heart disease Brother    Kidney disease Brother    Hypertension Other    Diabetes Mellitus II Other        All 9 sibblings have HTN   Colon cancer Neg Hx    Colon polyps Neg Hx    Esophageal cancer Neg Hx    Stomach cancer Neg Hx    Rectal cancer Neg Hx    SOCIAL HISTORY Social History   Tobacco Use   Smoking status: Never   Smokeless tobacco: Never  Vaping Use   Vaping Use: Never used  Substance Use Topics   Alcohol use: Not Currently   Drug use: Never       OPHTHALMIC EXAM:  Base Eye Exam     Visual Acuity (Snellen - Linear)       Right Left   Dist [place] 20/30-1 20/60-1   Dist ph [place] 20/NI 20/NI         Tonometry (Tonopen, 2:09 PM)       Right Left   Pressure 15 10         Pupils       Dark Light Shape React APD   Right 3 2 Round Minimal None   Left 3 2 Round Minimal None         Visual Fields        Left Right   Restrictions Partial outer superior temporal deficiency Partial outer superior nasal deficiency         Extraocular Movement       Right Left    Full, Ortho Full, Ortho         Neuro/Psych     Oriented x3: Yes   Mood/Affect: Normal  Dilation     Both eyes: 1.0% Mydriacyl, 2.5% Phenylephrine @ 2:09 PM           Slit Lamp and Fundus Exam     Slit Lamp Exam       Right Left   Lids/Lashes Dermatochalasis - upper lid Dermatochalasis - upper lid   Conjunctiva/Sclera Melanosis Melanosis   Cornea arcus, fine endo pigment arcus   Anterior Chamber deep and clear, no cell or flare deep and clear   Iris Round and dilated, No NVI Round and dilated, No NVI   Lens 2-3+ Nuclear sclerosis, 2-3+ Cortical cataract 2-3+ Nuclear sclerosis, 2-3+ Cortical cataract   Anterior Vitreous Vitreous syneresis Vitreous syneresis         Fundus Exam       Right Left   Disc Pink and Sharp, no NVD, +PPP Pink and Sharp, no NVD, +PPP   C/D Ratio 0.6 0.4   Macula good foveal reflex, central edema -- slightly improved, scattered MA/DBH - improved, focal exudates temporal macula - improved good foveal reflex, scattered MA/DBH, +edema temporal macula -- slightly improved, +exudates and CWS -- improved   Vessels attenuated, tortuous, focal NV IN arcades--regressing, mild copper wiring attenuated, Tortuous   Periphery Attached, 360 MA/DBH greatest posteriorly, light 360 PRP changes with room for fill in if needed Attached, 360 DBH           IMAGING AND PROCEDURES  Imaging and Procedures for 07/15/2022  OCT, Retina - OU - Both Eyes       Right Eye Quality was good. Central Foveal Thickness: 273. Progression has improved. Findings include no SRF, abnormal foveal contour, intraretinal hyper-reflective material, intraretinal fluid, vitreomacular adhesion (Mild interval improvement in central IRF and foveal contour, partial PVD).   Left Eye Quality was good.  Central Foveal Thickness: 204. Progression has improved. Findings include normal foveal contour, no SRF, intraretinal hyper-reflective material, intraretinal fluid, vitreomacular adhesion (Mild interval improvement in IRF temporal fovea and macula, patchy ORA).   Notes *Images captured and stored on drive  Diagnosis / Impression:  +DME OU OD: mild interval improvement in central IRF and foveal contour, partial PVD OS: mild interval improvement in IRF temporal fovea and macula, patchy ORA  Clinical management:  See below  Abbreviations: NFP - Normal foveal profile. CME - cystoid macular edema. PED - pigment epithelial detachment. IRF - intraretinal fluid. SRF - subretinal fluid. EZ - ellipsoid zone. ERM - epiretinal membrane. ORA - outer retinal atrophy. ORT - outer retinal tubulation. SRHM - subretinal hyper-reflective material. IRHM - intraretinal hyper-reflective material      Intravitreal Injection, Pharmacologic Agent - OD - Right Eye       Time Out 07/15/2022. 3:21 PM. Confirmed correct patient, procedure, site, and patient consented.   Anesthesia Topical anesthesia was used. Anesthetic medications included Lidocaine 2%, Proparacaine 0.5%.   Procedure Preparation included 5% betadine to ocular surface, eyelid speculum. A supplied (32g) needle was used.   Injection: 1.25 mg Bevacizumab 1.'25mg'$ /0.53m   Route: Intravitreal, Site: Right Eye   NDC: 5H061816 Lot:: [id number] Expiration date: 08/25/2022   Post-op Post injection exam found visual acuity of at least counting fingers. The patient tolerated the procedure well. There were no complications. The patient received written and verbal post procedure care education. Post injection medications were not given.      Intravitreal Injection, Pharmacologic Agent - OS - Left Eye       Time Out 07/15/2022. 3:22 PM. Confirmed correct patient, procedure, site, and patient consented.  Anesthesia Topical anesthesia was used.  Anesthetic medications included Lidocaine 2%, Proparacaine 0.5%.   Procedure Preparation included 5% betadine to ocular surface, eyelid speculum. A (32g) needle was used.   Injection: 1.25 mg Bevacizumab 1.'25mg'$ /0.48m   Route: Intravitreal, Site: Left Eye   NDC: 5H061816 Lot:: [id number] Expiration date: 08/17/2022   Post-op Post injection exam found visual acuity of at least counting fingers. The patient tolerated the procedure well. There were no complications. The patient received written and verbal post procedure care education. Post injection medications were not given.             ASSESSMENT/PLAN:    ICD-10-CM   1. Proliferative diabetic retinopathy of right eye with macular edema associated with type 2 diabetes mellitus (HCC)  E11.3511 OCT, Retina - OU - Both Eyes    Intravitreal Injection, Pharmacologic Agent - OD - Right Eye    Bevacizumab (AVASTIN) SOLN 1.25 mg    2. Severe nonproliferative diabetic retinopathy of left eye with macular edema associated with type 2 diabetes mellitus (HCC)  EA45.3646Intravitreal Injection, Pharmacologic Agent - OS - Left Eye    Bevacizumab (AVASTIN) SOLN 1.25 mg    3. Essential hypertension  I10     4. Hypertensive retinopathy of both eyes  H35.033     5. Combined forms of age-related cataract of both eyes  H25.813       1,2. Proliferative diabetic retinopathy, OD  - severe NPDR OS  - delayed f/u today -- 7 wks instead of 6 due to transportation - delayed to follow up from 10.28.22 to 01.04.23 (9+ wks) - s/p IVA OU #1 (06.06.22), #2 (07.08.22), #3 (08.05.22), #4 (09.02.22), #5 (09.30.22), #6 (10.28.22), #7 (01.04.23), #8 (02.20.33), #9 (04.03.23), #10 (05.15.23), #11 (06.28.23), #12 (08.16.23), #13 (10.10.23) **h/o increased IRF/DME OU at 7 wks, noted on 08.16.23**  - s/p PRP OD (06.10.22) - exam shows central edema OU, scattered IRH -- improving - FA (06.06.22) shows OD: focal NVE IN arcades; OS: no NV OS; late leaking MA's OU  -- s/p PRP OD 6.10.22 - OCT shows OD: mild interval improvement in central IRF and foveal contour, partial PVD; OS: mild interval improvement in IRF temporal fovea and macula, patchy ORA - BCVA -- OD 20/30 (improved from 20/40); OS 20/60 (stable) - recommend IVA OU #14 today, 11.14.23 w/ f/u in 5-6 wks - pt wishes to proceed - RBA of procedure discussed, questions answered - IVA informed consent obtained and re-signed (OU), 06.28.23 - see procedure note - f/u 5-6 weeks, DFE/OCT, possible injection(s)  3,4. Hypertensive retinopathy OU - discussed importance of tight BP control - monitor  5. Mixed Cataract OU - The symptoms of cataract, surgical options, and treatments and risks were discussed with patient. - discussed diagnosis and progression - under the expert management of Dr. SWyatt Portela- clear from a retina standpoint to proceed with cataract surgery when both patient and surgeon are ready - cataract evaluation scheduled for December 2023  Ophthalmic Meds Ordered this visit:  Meds ordered this encounter  Medications   Bevacizumab (AVASTIN) SOLN 1.25 mg   Bevacizumab (AVASTIN) SOLN 1.25 mg     Return for 5-6 wks - DME OU, Dilated Exam, OCT, Possible Injxn.  There are no Patient Instructions on file for this visit.  This document serves as a record of services personally performed by BGardiner Sleeper MD, PhD. It was created on their behalf by DRoselee Nova COMT. The creation of this record is the provider's dictation and/or activities  during the visit.  Electronically signed by: Roselee Nova, COMT 07/15/22 11:03 PM  Gardiner Sleeper, M.D., Ph.D. Diseases & Surgery of the Retina and Vitreous [facility]  I have reviewed the above documentation for accuracy and completeness, and I agree with the above. Gardiner Sleeper, M.D., Ph.D. 07/15/22 11:03 PM   Abbreviations: M myopia (nearsighted); A astigmatism; H hyperopia (farsighted); P presbyopia; Mrx  spectacle prescription;  CTL contact lenses; OD right eye; OS left eye; OU both eyes  XT exotropia; ET esotropia; PEK punctate epithelial keratitis; PEE punctate epithelial erosions; DES dry eye syndrome; MGD meibomian gland dysfunction; ATs artificial tears; PFAT's preservative free artificial tears; [place] nuclear sclerotic cataract; PSC posterior subcapsular cataract; ERM epi-retinal membrane; PVD posterior vitreous detachment; RD retinal detachment; DM diabetes mellitus; DR diabetic retinopathy; NPDR non-proliferative diabetic retinopathy; PDR proliferative diabetic retinopathy; CSME clinically significant macular edema; DME diabetic macular edema; dbh dot blot hemorrhages; CWS cotton wool spot; POAG primary open angle glaucoma; C/D cup-to-disc ratio; HVF humphrey visual field; GVF goldmann visual field; OCT optical coherence tomography; IOP intraocular pressure; BRVO Branch retinal vein occlusion; CRVO central retinal vein occlusion; CRAO central retinal artery occlusion; BRAO branch retinal artery occlusion; RT retinal tear; SB scleral buckle; PPV pars plana vitrectomy; VH Vitreous hemorrhage; PRP panretinal laser photocoagulation; IVK intravitreal kenalog; VMT vitreomacular traction; MH Macular hole;  NVD neovascularization of the disc; NVE neovascularization elsewhere; AREDS age related eye disease study; ARMD age related macular degeneration; POAG primary open angle glaucoma; EBMD epithelial/anterior basement membrane dystrophy; ACIOL anterior chamber intraocular lens; IOL intraocular lens; PCIOL posterior chamber intraocular lens; Phaco/IOL phacoemulsification with intraocular lens placement; Pink photorefractive keratectomy; LASIK laser assisted in situ keratomileusis; HTN hypertension; DM diabetes mellitus; COPD chronic obstructive pulmonary disease

## 2022-07-15 ENCOUNTER — Ambulatory Visit (INDEPENDENT_AMBULATORY_CARE_PROVIDER_SITE_OTHER): Payer: Medicare Other | Admitting: Ophthalmology

## 2022-07-15 ENCOUNTER — Encounter (INDEPENDENT_AMBULATORY_CARE_PROVIDER_SITE_OTHER): Payer: Self-pay | Admitting: Ophthalmology

## 2022-07-15 DIAGNOSIS — E113511 Type 2 diabetes mellitus with proliferative diabetic retinopathy with macular edema, right eye: Secondary | ICD-10-CM

## 2022-07-15 DIAGNOSIS — I10 Essential (primary) hypertension: Secondary | ICD-10-CM

## 2022-07-15 DIAGNOSIS — E113412 Type 2 diabetes mellitus with severe nonproliferative diabetic retinopathy with macular edema, left eye: Secondary | ICD-10-CM | POA: Diagnosis not present

## 2022-07-15 DIAGNOSIS — H35033 Hypertensive retinopathy, bilateral: Secondary | ICD-10-CM

## 2022-07-15 DIAGNOSIS — H25813 Combined forms of age-related cataract, bilateral: Secondary | ICD-10-CM

## 2022-07-15 MED ORDER — BEVACIZUMAB CHEMO INJECTION 1.25MG/0.05ML SYRINGE FOR KALEIDOSCOPE
1.2500 mg | INTRAVITREAL | Status: AC | PRN
  Administered 2022-07-15: 1.25 mg via INTRAVITREAL

## 2022-07-18 ENCOUNTER — Ambulatory Visit: Payer: Medicare Other | Admitting: Hematology & Oncology

## 2022-07-18 ENCOUNTER — Other Ambulatory Visit: Payer: Medicare Other

## 2022-07-23 ENCOUNTER — Inpatient Hospital Stay: Payer: Medicare Other

## 2022-07-23 ENCOUNTER — Encounter: Payer: Self-pay | Admitting: Hematology & Oncology

## 2022-07-23 ENCOUNTER — Other Ambulatory Visit: Payer: Self-pay

## 2022-07-23 ENCOUNTER — Inpatient Hospital Stay: Payer: Medicare Other | Attending: Hematology & Oncology | Admitting: Hematology & Oncology

## 2022-07-23 VITALS — BP 172/86 | HR 57 | Temp 97.5°F | Resp 18 | Ht 70.0 in | Wt 151.0 lb

## 2022-07-23 DIAGNOSIS — D472 Monoclonal gammopathy: Secondary | ICD-10-CM

## 2022-07-23 DIAGNOSIS — Z79899 Other long term (current) drug therapy: Secondary | ICD-10-CM | POA: Insufficient documentation

## 2022-07-23 DIAGNOSIS — Z7982 Long term (current) use of aspirin: Secondary | ICD-10-CM | POA: Insufficient documentation

## 2022-07-23 DIAGNOSIS — I509 Heart failure, unspecified: Secondary | ICD-10-CM | POA: Insufficient documentation

## 2022-07-23 LAB — CMP (CANCER CENTER ONLY)
ALT: 18 U/L (ref 0–44)
AST: 20 U/L (ref 15–41)
Albumin: 4 g/dL (ref 3.5–5.0)
Alkaline Phosphatase: 82 U/L (ref 38–126)
Anion gap: 6 (ref 5–15)
BUN: 19 mg/dL (ref 8–23)
CO2: 34 mmol/L — ABNORMAL HIGH (ref 22–32)
Calcium: 9.6 mg/dL (ref 8.9–10.3)
Chloride: 103 mmol/L (ref 98–111)
Creatinine: 1.06 mg/dL (ref 0.61–1.24)
GFR, Estimated: >60 mL/min (ref >60)
Glucose, Bld: 197 mg/dL — ABNORMAL HIGH (ref 70–99)
Potassium: 4.4 mmol/L (ref 3.5–5.1)
Sodium: 143 mmol/L (ref 135–145)
Total Bilirubin: 0.7 mg/dL (ref 0.3–1.2)
Total Protein: 8.1 g/dL (ref 6.5–8.1)

## 2022-07-23 LAB — CBC WITH DIFFERENTIAL (CANCER CENTER ONLY)
Abs Immature Granulocytes: 0.02 10*3/uL (ref 0.00–0.07)
Basophils Absolute: 0 10*3/uL (ref 0.0–0.1)
Basophils Relative: 1 %
Eosinophils Absolute: 0.2 10*3/uL (ref 0.0–0.5)
Eosinophils Relative: 3 %
HCT: 34.1 % — ABNORMAL LOW (ref 39.0–52.0)
Hemoglobin: 11.4 g/dL — ABNORMAL LOW (ref 13.0–17.0)
Immature Granulocytes: 0 %
Lymphocytes Relative: 19 %
Lymphs Abs: 1.1 10*3/uL (ref 0.7–4.0)
MCH: 32.6 pg (ref 26.0–34.0)
MCHC: 33.4 g/dL (ref 30.0–36.0)
MCV: 97.4 fL (ref 80.0–100.0)
Monocytes Absolute: 0.5 10*3/uL (ref 0.1–1.0)
Monocytes Relative: 8 %
Neutro Abs: 4.2 10*3/uL (ref 1.7–7.7)
Neutrophils Relative %: 69 %
Platelet Count: 163 10*3/uL (ref 150–400)
RBC: 3.5 MIL/uL — ABNORMAL LOW (ref 4.22–5.81)
RDW: 12.1 % (ref 11.5–15.5)
WBC Count: 6 10*3/uL (ref 4.0–10.5)
nRBC: 0 % (ref 0.0–0.2)

## 2022-07-23 LAB — LACTATE DEHYDROGENASE: LDH: 174 U/L (ref 98–192)

## 2022-07-23 NOTE — Progress Notes (Signed)
Hematology and Oncology Follow Up Visit  Earl Calderon [id number] [birth year] 71 y.o. 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  We last saw him back in August.  At that time, everything looked fairly stable with his smoldering myeloma.  His monoclonal spike was 1.3 g/dL.  His IgG level was 2100 mg/dL.  The Kappa light chain was 12.1 mg/dL.  He did have a 24-hour urine done.  This did show a monoclonal protein that was IgG kappa.  His Kappa light chain excretion was 78 mg/L.  He feels okay.  He says he not take his medications like he should.  He is on quite a few medications.  He has had no issues with congestive heart failure.  He has had no problems with bowels or bladder.  He has had no bony pain.  He has had no cough or shortness of breath.  There is been no bleeding.  He has had no headache.  Overall, his performance status is ECOG 2.    Medications:  Current Outpatient Medications:    APPLE CIDER VINEGAR PO, Take 1 tablet by mouth daily at 6 (six) AM., Disp: , Rfl:    ASPIRIN LOW DOSE 81 MG EC tablet, TAKE 1 TABLET BY MOUTH ONCE DAILY (SWALLOW WHOLE), Disp: 90 tablet, Rfl: 3   carvedilol (COREG) 12.5 MG tablet, TAKE ONE TABLET BY MOUTH TWICE DAILY WITH A MEAL, Disp: 180 tablet, Rfl: 1   ELDERBERRY PO, Take 100 mg by mouth daily., Disp: , Rfl:    ENTRESTO 97-103 MG, TAKE 1 TABLET BY MOUTH TWICE DAILY, Disp: 180 tablet, Rfl: 3   ferrous sulfate 325 (65 FE) MG tablet, Take 1 tablet (325 mg total) by mouth daily with breakfast., Disp: 90 tablet, Rfl: 3   furosemide (LASIX) 40 MG tablet, TAKE 1 TABLET BY MOUTH ONCE DAILY, Disp: 90 tablet, Rfl: 3   glucose blood (FREESTYLE TEST STRIPS) test strip, Use as instructed, Disp: 100 each, Rfl: 12   hydrALAZINE (APRESOLINE) 25 MG tablet, TAKE 1 TABLET BY MOUTH IN THE MORNING, AND TAKE 1 TABLET AT BEDTIME,  Disp: 180 tablet, Rfl: 2   insulin aspart (NOVOLOG FLEXPEN) 100 UNIT/ML FlexPen, Before each meal 3 times a day, 140-199 - 2 units, 200-250 - 4 units, 251-299 - 6 units,  300-349 - 8 units,  350 or above 10 units. Insulin PEN if approved, provide syringes and needles if needed., Disp: 15 mL, Rfl: 2   Insulin Pen Needle (NOVOFINE PEN NEEDLE) 32G X 6 MM MISC, USE AS DIRECTED, Disp: 100 each, Rfl: 6   JARDIANCE 10 MG TABS tablet, TAKE ONE TABLET BY MOUTH DAILY BEFORE BREAKFAST, Disp: 90 tablet, Rfl: 2   LEVEMIR FLEXTOUCH 100 UNIT/ML FlexPen, Inject 8 Units into the skin 2 (two) times daily., Disp: 15 mL, Rfl: 1   nitroGLYCERIN (NITROSTAT) 0.4 MG SL tablet, Place 1 tablet (0.4 mg total) under the tongue every 5 (five) minutes as needed for chest pain., Disp: 25 tablet, Rfl: 3   Probiotic Product (PROBIOTIC MULTI-ENZYME) TABS, Take 3 tablets by mouth daily at 6 (six) AM., Disp: , Rfl:    rosuvastatin (CRESTOR) 40 MG tablet, TAKE 1 TABLET BY MOUTH ONCE DAILY AT 6PM, Disp: 90 tablet, Rfl: 2   spironolactone (ALDACTONE) 25 MG tablet, TAKE 1 TABLET BY MOUTH ONCE DAILY, Disp: 90 tablet, Rfl: 2   Turmeric  500 MG TABS, Take 2 tablets by mouth daily at 6 (six) AM., Disp: , Rfl:   Allergies: No Known Allergies  Past Medical History, Surgical history, Social history, and Family History were reviewed and updated.  Review of Systems: Review of Systems  Constitutional:  Positive for fatigue.  HENT:  Negative.    Eyes: Negative.   Respiratory: Negative.    Cardiovascular: Negative.   Gastrointestinal: Negative.   Endocrine: Negative.   Genitourinary: Negative.    Musculoskeletal: Negative.   Skin: Negative.   Neurological: Negative.   Hematological: Negative.   Psychiatric/Behavioral: Negative.      Physical Exam:  height is '5\' 10"'$  (1.778 m) and weight is 151 lb (68.5 kg). His oral temperature is 97.5 F (36.4 C) (abnormal). His blood pressure is 172/86 (abnormal) and his pulse is 57 (abnormal). His  respiration is 18 and oxygen saturation is 100%.   Wt Readings from Last 3 Encounters:  07/23/22 151 lb (68.5 kg)  04/21/22 146 lb (66.2 kg)  04/09/22 146 lb (66.2 kg)    Physical Exam Vitals reviewed.  HENT:     Head: Normocephalic and atraumatic.  Eyes:     Pupils: Pupils are equal, round, and reactive to light.  Cardiovascular:     Rate and Rhythm: Normal rate and regular rhythm.     Heart sounds: Normal heart sounds.  Pulmonary:     Effort: Pulmonary effort is normal.     Breath sounds: Normal breath sounds.  Abdominal:     General: Bowel sounds are normal.     Palpations: Abdomen is soft.  Musculoskeletal:        General: No tenderness or deformity. Normal range of motion.     Cervical back: Normal range of motion.  Lymphadenopathy:     Cervical: No cervical adenopathy.  Skin:    General: Skin is warm and dry.     Findings: No erythema or rash.  Neurological:     Mental Status: He is alert and oriented to person, place, and time.  Psychiatric:        Behavior: Behavior normal.        Thought Content: Thought content normal.        Judgment: Judgment normal.      Lab Results  Component Value Date   WBC 6.0 07/23/2022   HGB 11.4 (L) 07/23/2022   HCT 34.1 (L) 07/23/2022   MCV 97.4 07/23/2022   PLT 163 07/23/2022     Chemistry      Component Value Date/Time   NA 142 04/09/2022 1514   NA 148 (H) 11/25/2021 1521   K 3.8 04/09/2022 1514   CL 105 04/09/2022 1514   CO2 29 04/09/2022 1514   BUN 34 (H) 04/09/2022 1514   BUN 19 11/25/2021 1521   CREATININE 1.31 (H) 04/09/2022 1514      Component Value Date/Time   CALCIUM 9.4 04/09/2022 1514   ALKPHOS 52 04/09/2022 1514   AST 22 04/09/2022 1514   ALT 19 04/09/2022 1514   BILITOT 0.8 04/09/2022 1514       Impression and Plan: Earl Calderon is a very nice 71 year old African-American male.  He has what I would consider smoldering myeloma.  He has an IgG kappa spike.  We will see what his myeloma studies  look like.  Hopefully, everything is stable.  I really would like to get him through the holiday season and Winter.  I suspect though that we probably will have to do some kind  of treatment next year.  His quality of life is critical.  Hopefully, he will take his medications.  He does have congestive heart failure.  His last echocardiogram was back a year ago and September which showed an ejection fraction of 40-45%.  I will get her back in March 2024.   Volanda Napoleon, MD 11/22/20238:30 AM

## 2022-07-25 LAB — IGG, IGA, IGM
IgA: 219 mg/dL (ref 61–437)
IgG (Immunoglobin G), Serum: 2252 mg/dL — ABNORMAL HIGH (ref 603–1613)
IgM (Immunoglobulin M), Srm: 58 mg/dL (ref 20–172)

## 2022-07-25 LAB — KAPPA/LAMBDA LIGHT CHAINS
Kappa free light chain: 130.2 mg/L — ABNORMAL HIGH (ref 3.3–19.4)
Kappa, lambda light chain ratio: 5.43 — ABNORMAL HIGH (ref 0.26–1.65)
Lambda free light chains: 24 mg/L (ref 5.7–26.3)

## 2022-08-01 LAB — PROTEIN ELECTROPHORESIS, SERUM, WITH REFLEX
A/G Ratio: 1 (ref 0.7–1.7)
Albumin ELP: 3.8 g/dL (ref 2.9–4.4)
Alpha-1-Globulin: 0.2 g/dL (ref 0.0–0.4)
Alpha-2-Globulin: 0.6 g/dL (ref 0.4–1.0)
Beta Globulin: 0.8 g/dL (ref 0.7–1.3)
Gamma Globulin: 2.2 g/dL — ABNORMAL HIGH (ref 0.4–1.8)
Globulin, Total: 3.9 g/dL (ref 2.2–3.9)
M-Spike, %: 1.6 g/dL — ABNORMAL HIGH
SPEP Interpretation: 0
Total Protein ELP: 7.7 g/dL (ref 6.0–8.5)

## 2022-08-01 LAB — IMMUNOFIXATION REFLEX, SERUM
IgA: 251 mg/dL (ref 61–437)
IgG (Immunoglobin G), Serum: 2510 mg/dL — ABNORMAL HIGH (ref 603–1613)
IgM (Immunoglobulin M), Srm: 65 mg/dL (ref 20–172)

## 2022-08-05 NOTE — Progress Notes (Shared)
[facility] Note  08/19/2022    CHIEF COMPLAINT Patient presents for No chief complaint on file.  HISTORY OF PRESENT ILLNESS: Earl Calderon is a 71 y.o. male who presents to the clinic today for:     Pt states vision the same OU.  Referring physician: [name], MD [address]  [address] [place],  [place] [address]  HISTORICAL INFORMATION:   Selected notes from the MEDICAL RECORD NUMBER Referred by Dr. [name] for severe NPDR OU   CURRENT MEDICATIONS: No current outpatient medications on file. (Ophthalmic Drugs)   No current facility-administered medications for this visit. (Ophthalmic Drugs)   Current Outpatient Medications (Other)  Medication Sig   APPLE CIDER VINEGAR PO Take 1 tablet by mouth daily at 6 (six) AM.   ASPIRIN LOW DOSE 81 MG EC tablet TAKE 1 TABLET BY MOUTH ONCE DAILY (SWALLOW WHOLE)   carvedilol (COREG) 12.5 MG tablet TAKE ONE TABLET BY MOUTH TWICE DAILY WITH A MEAL   ELDERBERRY PO Take 100 mg by mouth daily.   ENTRESTO 97-103 MG TAKE 1 TABLET BY MOUTH TWICE DAILY   ferrous sulfate 325 (65 FE) MG tablet Take 1 tablet (325 mg total) by mouth daily with breakfast.   furosemide (LASIX) 40 MG tablet TAKE 1 TABLET BY MOUTH ONCE DAILY   glucose blood (FREESTYLE TEST STRIPS) test strip Use as instructed   hydrALAZINE (APRESOLINE) 25 MG tablet TAKE 1 TABLET BY MOUTH IN THE MORNING, AND TAKE 1 TABLET AT BEDTIME   insulin aspart (NOVOLOG FLEXPEN) 100 UNIT/ML FlexPen Before each meal 3 times a day, 140-199 - 2 units, 200-250 - 4 units, 251-299 - 6 units,  300-349 - 8 units,  350 or above 10 units. Insulin PEN if approved, provide syringes and needles if needed.   Insulin Pen Needle (NOVOFINE PEN NEEDLE) 32G X 6 MM MISC USE AS DIRECTED   JARDIANCE 10 MG TABS tablet TAKE ONE TABLET BY MOUTH DAILY BEFORE BREAKFAST   LEVEMIR FLEXTOUCH 100 UNIT/ML FlexPen Inject 8 Units into the skin 2 (two) times daily.   nitroGLYCERIN  (NITROSTAT) 0.4 MG SL tablet Place 1 tablet (0.4 mg total) under the tongue every 5 (five) minutes as needed for chest pain.   Probiotic Product (PROBIOTIC MULTI-ENZYME) TABS Take 3 tablets by mouth daily at 6 (six) AM.   rosuvastatin (CRESTOR) 40 MG tablet TAKE 1 TABLET BY MOUTH ONCE DAILY AT 6PM   spironolactone (ALDACTONE) 25 MG tablet TAKE 1 TABLET BY MOUTH ONCE DAILY   Turmeric 500 MG TABS Take 2 tablets by mouth daily at 6 (six) AM.   No current facility-administered medications for this visit. (Other)   REVIEW OF SYSTEMS:    ALLERGIES No Known Allergies  PAST MEDICAL HISTORY Past Medical History:  Diagnosis Date   CAD in native artery 01/01/2021   Prior LAD PCI.  Currently no symptoms of ischemia.  Encouraged him to do some light exercising.  Continue aspirin, carvedilol, and rosuvastatin.   Cataract    CKD (chronic kidney disease), stage III ([place]) 08/24/2020   Coronary artery disease    Diabetes mellitus without complication ([place])    on meds   Diabetic retinopathy ([place])    Goals of care, counseling/discussion 07/01/2021   Hernia, inguinal    Hypertension    on meds   Hypertensive retinopathy    Lymphadenopathy, cervical 07/01/2021   MGUS (monoclonal gammopathy of unknown significance) 07/01/2021   NSVT (nonsustained ventricular tachycardia) ([place]) 08/24/2020  Pure hypercholesterolemia 08/05/2021   Uncontrolled type 2 diabetes mellitus 11/11/2018   Past Surgical History:  Procedure Laterality Date   CORONARY STENT INTERVENTION N/A 11/15/2018   Procedure: CORONARY STENT INTERVENTION;  Surgeon: [name], MD;  Location: [facility];  Service: Cardiovascular;  Laterality: N/A;   HEMORROIDECTOMY     RIGHT HEART CATH N/A 07/18/2020   Procedure: RIGHT HEART CATH;  Surgeon: [name], MD;  Location: [facility];  Service: Cardiovascular;  Laterality: N/A;   RIGHT/LEFT HEART CATH AND CORONARY ANGIOGRAPHY N/A 11/15/2018   Procedure:  RIGHT/LEFT HEART CATH AND CORONARY ANGIOGRAPHY;  Surgeon: [name], MD;  Location: [facility];  Service: Cardiovascular;  Laterality: N/A;   WISDOM TOOTH EXTRACTION     FAMILY HISTORY Family History  Problem Relation Age of Onset   Hypertension Mother    Diabetes Mellitus II Mother    Arrhythmia Mother    Heart disease Mother    Hypertension Sister    Hypertension Brother    Heart disease Brother    Kidney disease Brother    Hypertension Other    Diabetes Mellitus II Other        All 9 sibblings have HTN   Colon cancer Neg Hx    Colon polyps Neg Hx    Esophageal cancer Neg Hx    Stomach cancer Neg Hx    Rectal cancer Neg Hx    SOCIAL HISTORY Social History   Tobacco Use   Smoking status: Never   Smokeless tobacco: Never  Vaping Use   Vaping Use: Never used  Substance Use Topics   Alcohol use: Not Currently   Drug use: Never       OPHTHALMIC EXAM:  Not recorded    IMAGING AND PROCEDURES  Imaging and Procedures for 08/19/2022           ASSESSMENT/PLAN:    ICD-10-CM   1. Proliferative diabetic retinopathy of right eye with macular edema associated with type 2 diabetes mellitus ([place])  E11.3511     2. Severe nonproliferative diabetic retinopathy of left eye with macular edema associated with type 2 diabetes mellitus ([place])  X83.3825     3. Essential hypertension  I10     4. Hypertensive retinopathy of both eyes  H35.033     5. Combined forms of age-related cataract of both eyes  H25.813        1,2. Proliferative diabetic retinopathy, OD  - severe NPDR OS  - delayed f/u today -- 7 wks instead of 6 due to transportation - delayed to follow up from 10.28.22 to 01.04.23 (9+ wks) - s/p IVA OU #1 (06.06.22), #2 (07.08.22), #3 (08.05.22), #4 (09.02.22), #5 (09.30.22), #6 (10.28.22), #7 (01.04.23), #8 (02.20.33), #9 (04.03.23), #10 (05.15.23), #11 (06.28.23), #12 (08.16.23), #13 (10.10.23), #14 (11.14.23) **h/o increased IRF/DME OU at 7  wks, noted on 08.16.23**  - s/p PRP OD (06.10.22) - exam shows central edema OU, scattered IRH -- improving - FA (06.06.22) shows OD: focal NVE IN arcades; OS: no NV OS; late leaking MA's OU -- s/p PRP OD 6.10.22 - OCT shows OD: mild interval improvement in central IRF and foveal contour, partial PVD; OS: mild interval improvement in IRF temporal fovea and macula, patchy ORA - BCVA -- OD 20/30 (improved from 20/40); OS 20/60 (stable) - recommend IVA OU #15 today, 12.19.23 w/ f/u in 5-6 wks - pt wishes to proceed - RBA of procedure discussed, questions answered - IVA informed consent obtained  and re-signed (OU), 06.28.23 - see procedure note - f/u 5-6 weeks, DFE/OCT, possible injection(s)  3,4. Hypertensive retinopathy OU - discussed importance of tight BP control - monitor  5. Mixed Cataract OU - The symptoms of cataract, surgical options, and treatments and risks were discussed with patient. - discussed diagnosis and progression - under the expert management of Dr. Wyatt Portela - clear from a retina standpoint to proceed with cataract surgery when both patient and surgeon are ready - cataract evaluation scheduled for December 2023  Ophthalmic Meds Ordered this visit:  No orders of the defined types were placed in this encounter.    No follow-ups on file.  There are no Patient Instructions on file for this visit.  This document serves as a record of services personally performed by Gardiner Sleeper, MD, PhD. It was created on their behalf by Renaldo Reel, [place] an ophthalmic technician. The creation of this record is the provider's dictation and/or activities during the visit.    Electronically signed by:  Renaldo Reel, COT  12.05.23 8:32 AM   Gardiner Sleeper, M.D., Ph.D. Diseases & Surgery of the Retina and Vitreous [facility]: M myopia (nearsighted); A astigmatism; H hyperopia (farsighted); P presbyopia; Mrx spectacle  prescription;  CTL contact lenses; OD right eye; OS left eye; OU both eyes  XT exotropia; ET esotropia; PEK punctate epithelial keratitis; PEE punctate epithelial erosions; DES dry eye syndrome; MGD meibomian gland dysfunction; ATs artificial tears; PFAT's preservative free artificial tears; [place] nuclear sclerotic cataract; PSC posterior subcapsular cataract; ERM epi-retinal membrane; PVD posterior vitreous detachment; RD retinal detachment; DM diabetes mellitus; DR diabetic retinopathy; NPDR non-proliferative diabetic retinopathy; PDR proliferative diabetic retinopathy; CSME clinically significant macular edema; DME diabetic macular edema; dbh dot blot hemorrhages; CWS cotton wool spot; POAG primary open angle glaucoma; C/D cup-to-disc ratio; HVF humphrey visual field; GVF goldmann visual field; OCT optical coherence tomography; IOP intraocular pressure; BRVO Branch retinal vein occlusion; CRVO central retinal vein occlusion; CRAO central retinal artery occlusion; BRAO branch retinal artery occlusion; RT retinal tear; SB scleral buckle; PPV pars plana vitrectomy; VH Vitreous hemorrhage; PRP panretinal laser photocoagulation; IVK intravitreal kenalog; VMT vitreomacular traction; MH Macular hole;  NVD neovascularization of the disc; NVE neovascularization elsewhere; AREDS age related eye disease study; ARMD age related macular degeneration; POAG primary open angle glaucoma; EBMD epithelial/anterior basement membrane dystrophy; ACIOL anterior chamber intraocular lens; IOL intraocular lens; PCIOL posterior chamber intraocular lens; Phaco/IOL phacoemulsification with intraocular lens placement; [place] photorefractive keratectomy; LASIK laser assisted in situ keratomileusis; HTN hypertension; DM diabetes mellitus; COPD chronic obstructive pulmonary disease

## 2022-08-07 ENCOUNTER — Encounter (HOSPITAL_BASED_OUTPATIENT_CLINIC_OR_DEPARTMENT_OTHER): Payer: Self-pay | Admitting: Cardiovascular Disease

## 2022-08-07 ENCOUNTER — Ambulatory Visit (INDEPENDENT_AMBULATORY_CARE_PROVIDER_SITE_OTHER): Payer: Medicare Other | Admitting: Cardiovascular Disease

## 2022-08-07 VITALS — BP 184/68 | HR 65 | Ht 70.0 in | Wt 155.3 lb

## 2022-08-07 DIAGNOSIS — I1A0 Resistant hypertension: Secondary | ICD-10-CM | POA: Diagnosis not present

## 2022-08-07 DIAGNOSIS — E7800 Pure hypercholesterolemia, unspecified: Secondary | ICD-10-CM

## 2022-08-07 DIAGNOSIS — I10 Essential (primary) hypertension: Secondary | ICD-10-CM | POA: Diagnosis not present

## 2022-08-07 DIAGNOSIS — I2510 Atherosclerotic heart disease of native coronary artery without angina pectoris: Secondary | ICD-10-CM

## 2022-08-07 DIAGNOSIS — I5042 Chronic combined systolic (congestive) and diastolic (congestive) heart failure: Secondary | ICD-10-CM

## 2022-08-07 NOTE — Patient Instructions (Addendum)
Medication Instructions:  START TAKING YOUR MEDICATIONS AS PRESCRIBED  Labwork: FASTING LP/CMET SOON   Testing/Procedures: NONE  Follow-Up: 4 MONTHS WITH DR [facility]   Any Other Special Instructions Will Be Listed Below (If Applicable).  MONITOR BLOOD PRESSURE TWICE A DAY  CALL THE OFFICE IN 1 WEEK WITH YOUR READINGS   Dr. Glendale Chard for Primary Care

## 2022-08-07 NOTE — Assessment & Plan Note (Signed)
LVEF 40-45%.  Breathing is stable.  He is feeling generally fatigued.  Continue carvedilol, furosemide, hydralazine, Jardiance, spironolactone, and Entresto.

## 2022-08-07 NOTE — Assessment & Plan Note (Signed)
This post PCI.  He is not having any angina.  Continue aspirin and statin.  Resume carvedilol.

## 2022-08-07 NOTE — Progress Notes (Signed)
Cardiology Office Note  Date:  08/07/2022   ID:  Earl Calderon, DOB 16-Nov-1950, MRN [id number]  PCP:  [name], MD  Cardiologist:   [name], MD   No chief complaint on file.    History of Present Illness: Earl Calderon is a 71 yo male with chronic systolic and diastolic heart failure, prior stroke, diabetes, hypertension, hyperlipidemia, CAD status post PCI and nonadherence admitted 07/2020 with acute on chronic systolic and diastolic heart failure in the setting of not taking his medications. He was admitted with volume overload and dyspnea.  He wanted to "remove all chemicals from his body" and decided to stop his medications. Echo revealed LVEF 30-35% with global hypokinesis and moderate LVH. BP was poorly controlled. There was concern for cardiac amyloidosis.  PYP scan during that hospitalization was not suggestive of ATTR amyloidosis.  He was started on carvedilol, Entresto, spironolactone, BiDil, and for [place].  Prior to discharge he had a right heart cath with radial pressure of 8, RV 53/3, pulmonary capillary wedge 15.  Mean PA pressure was 29 mmHg.  After discharge he was unable to get spironolactone or BiDil from the pharmacy.  Earl Calderon was first seen by cardiology 10/2018 at which time he was hospitalized and found to have a newly reduced LVEF to 25 to 30%.  EKG revealed left bundle branch block.  He underwent right and left heart cath 10/2018 and was found to have a 90% mid LAD lesion that was successfully stented with drug-eluting stent.  He did not follow-up after that time.  He was seen 07/2020 and he was not taking spirolactone or vidl, however they were not started because his blood pressure was low and he was fatigued. His Lasix was decreased given he was on farxiga. He followed up with [name] and Imdur was added. He then felt fatigued and weak and was seen on 10/2020 for chest pain.  At his initial appointment he was doing well from a cardiac  standpoint. He reported some DOE that did not concern him. He was working with [name] for diabetic retinopathy. At his last appointment, he was fatigued and mildly short of breath consistently and his eyesight was mostly unchanged. He reported slight improvement with the injection treatments. His blood pressure was low and he was not taking carvedilol, the dose was reduced to 12.17m. He saw [name] 06/2021 and his blood pressure was very elevated, though he had not taken medication that morning. He was referred for a R supraclavicular mass. He is being worked up for a monoclonal spike. He had a CT scan that showed the mass was a lipoma.   Blood pressures remained uncontrolled so hydralazine was increased. He had a bone marrow biopsy 12/2021 for MGUS and a positive M spike on SPEP.  He followed up with oncology 07/2022. Where he was diagnosed with IgG Kappa Smoldering myeloma, he has not required any treatment.  Today, the patient states that he has been sick a lot recently. He can't work in his yard or do anything around the house. He gets tired when doing daily tasks. However he states that his breathing has been ok and he denies any chest pain. He got up late and didn't take his medicine this morning which is his reason for his high blood pressure in clinic today (200/92, 184/68 for recheck) . He doesn't know what his blood pressure readings at home are but he states that he has them written down somewhere.  He cut back on his carvedilol a week ago by only taking one pill a day instead of two. He ran out of Amlodipine, and a few other medications and plans to get refills soon. He denies any palpitations, chest pain,  or peripheral edema. No lightheadedness, headaches, syncope, orthopnea, or PND.  Past Medical History:  Diagnosis Date   CAD in native artery 01/01/2021   Prior LAD PCI.  Currently no symptoms of ischemia.  Encouraged him to do some light exercising.  Continue aspirin, carvedilol, and  rosuvastatin.   Cataract    CKD (chronic kidney disease), stage III ([place]) 08/24/2020   Coronary artery disease    Diabetes mellitus without complication ([place])    on meds   Diabetic retinopathy ([place])    Goals of care, counseling/discussion 07/01/2021   Hernia, inguinal    Hypertension    on meds   Hypertensive retinopathy    Lymphadenopathy, cervical 07/01/2021   MGUS (monoclonal gammopathy of unknown significance) 07/01/2021   NSVT (nonsustained ventricular tachycardia) ([place]) 08/24/2020   Pure hypercholesterolemia 08/05/2021   Resistant hypertension 11/11/2018   Uncontrolled type 2 diabetes mellitus 11/11/2018    Past Surgical History:  Procedure Laterality Date   CORONARY STENT INTERVENTION N/A 11/15/2018   Procedure: CORONARY STENT INTERVENTION;  Surgeon: [name], MD;  Location: [facility];  Service: Cardiovascular;  Laterality: N/A;   HEMORROIDECTOMY     RIGHT HEART CATH N/A 07/18/2020   Procedure: RIGHT HEART CATH;  Surgeon: [name], MD;  Location: [facility];  Service: Cardiovascular;  Laterality: N/A;   RIGHT/LEFT HEART CATH AND CORONARY ANGIOGRAPHY N/A 11/15/2018   Procedure: RIGHT/LEFT HEART CATH AND CORONARY ANGIOGRAPHY;  Surgeon: [name], MD;  Location: [facility];  Service: Cardiovascular;  Laterality: N/A;   WISDOM TOOTH EXTRACTION       Current Outpatient Medications  Medication Sig Dispense Refill   APPLE CIDER VINEGAR PO Take 1 tablet by mouth daily at 6 (six) AM.     ASPIRIN LOW DOSE 81 MG EC tablet TAKE 1 TABLET BY MOUTH ONCE DAILY (SWALLOW WHOLE) 90 tablet 3   carvedilol (COREG) 12.5 MG tablet TAKE ONE TABLET BY MOUTH TWICE DAILY WITH A MEAL 180 tablet 1   ELDERBERRY PO Take 100 mg by mouth daily.     ENTRESTO 97-103 MG TAKE 1 TABLET BY MOUTH TWICE DAILY 180 tablet 3   ferrous sulfate 325 (65 FE) MG tablet Take 1 tablet (325 mg total) by mouth daily with breakfast. 90 tablet 3   furosemide (LASIX)  40 MG tablet TAKE 1 TABLET BY MOUTH ONCE DAILY 90 tablet 3   glucose blood (FREESTYLE TEST STRIPS) test strip Use as instructed 100 each 12   HUMALOG KWIKPEN 100 UNIT/ML KwikPen Inject into the skin. Sliding Scale     hydrALAZINE (APRESOLINE) 25 MG tablet TAKE 1 TABLET BY MOUTH IN THE MORNING, AND TAKE 1 TABLET AT BEDTIME 180 tablet 2   Insulin Pen Needle (NOVOFINE PEN NEEDLE) 32G X 6 MM MISC USE AS DIRECTED 100 each 6   JARDIANCE 10 MG TABS tablet TAKE ONE TABLET BY MOUTH DAILY BEFORE BREAKFAST 90 tablet 2   LEVEMIR FLEXTOUCH 100 UNIT/ML FlexPen Inject 8 Units into the skin 2 (two) times daily. 15 mL 1   Probiotic Product (PROBIOTIC MULTI-ENZYME) TABS Take 3 tablets by mouth daily at 6 (six) AM.     rosuvastatin (CRESTOR) 40 MG tablet TAKE 1 TABLET BY MOUTH ONCE DAILY AT  6PM 90 tablet 2   spironolactone (ALDACTONE) 25 MG tablet TAKE 1 TABLET BY MOUTH ONCE DAILY 90 tablet 2   Turmeric 500 MG TABS Take 2 tablets by mouth daily at 6 (six) AM.     nitroGLYCERIN (NITROSTAT) 0.4 MG SL tablet Place 1 tablet (0.4 mg total) under the tongue every 5 (five) minutes as needed for chest pain. 25 tablet 3   No current facility-administered medications for this visit.    Allergies:   Patient has no known allergies.    Social History:  The patient  reports that he has never smoked. He has never used smokeless tobacco. He reports that he does not currently use alcohol. He reports that he does not use drugs.   Family History:  The patient's family history includes Arrhythmia in his mother; Diabetes Mellitus II in his mother and another family member; Heart disease in his brother and mother; Hypertension in his brother, mother, sister, and another family member; Kidney disease in his brother.    ROS:   Please see the history of present illness. (+) Tiredness/fatigue All other systems are reviewed and negative.    PHYSICAL EXAM: VS:  BP (!) 184/68 (BP Location: Right Arm, Patient Position: Sitting, Cuff  Size: Normal)   Pulse 65   Ht _0  (1.778 m)   Wt 155 lb 4.8 oz (70.4 kg)   SpO2 97%   BMI 22.28 kg/m  , BMI Body mass index is 22.28 kg/m. GENERAL:  Well appearing.  No acute distress. HEENT:  Pupils equal round and reactive, fundi not visualized, oral mucosa unremarkable NECK:  No jugular venous distention, waveform within normal limits, carotid upstroke brisk and symmetric, no bruits LUNGS:  Clear to auscultation bilaterally HEART:  RRR.  PMI not displaced or sustained,S1 and S2 within normal limits, no S3, no S4, no clicks, no rubs, no murmurs ABD:  Flat, positive bowel sounds normal in frequency in pitch, no bruits, no rebound, no guarding, no midline pulsatile mass, no hepatomegaly, no splenomegaly EXT:  2 plus pulses throughout, no edema, no cyanosis no clubbing SKIN:  No rashes no nodules NEURO:  Cranial nerves II through XII grossly intact, motor grossly intact throughout PSYCH:  Cognitively intact, oriented to person place and time  EKG:   The EKG is personally reviewed. 08/07/2022: The EKG is not ordered today. 02/03/2022:  Sinus bradycardia. Rate 53 bpm. LBBB. 05/03/2021: Sinus bradycardia. Rate 59 bpm. LBBB.   Chest CT 07/19/21 1. No evidence for lymphadenopathy in the chest, abdomen, or pelvis. 2. 1.9 cm lesion in the lateral segment left liver shows peripheral enhancement and filling in delayed imaging. Imaging characteristics suggest cavernous hemangioma but are not entirely characteristic. MRI abdomen without and with contrast recommended to further evaluate. 3. 12 mm hypervascular lesion in the spleen cannot be definitively characterized but is likely benign and may be a hemangioma. Attention on follow-up recommended. 4. 2 mm right middle lobe and 4 mm perifissural right lower lobe pulmonary nodules. No follow-up needed if patient is low-risk (and has no known or suspected primary neoplasm). Non-contrast chest CT can be considered in 12 months if patient is  high-risk. This recommendation follows the consensus statement: Guidelines for Management of Incidental Pulmonary Nodules Detected on CT Images: From the Fleischner Society 2017; Radiology 2017; 284:228-243. 5. Small left groin hernia contains only fat. 6. Aortic Atherosclerosis (ICD10-I70.0).  Echo 05/27/21  1. Left ventricular ejection fraction, by estimation, is 40 to 45%. Left  ventricular ejection fraction by 3D  volume is 44 %. The left ventricle has  mildly decreased function. The left ventricle demonstrates global  hypokinesis. There is moderate concentric left ventricular hypertrophy. Left ventricular diastolic parameters are consistent with Grade I diastolic dysfunction (impaired relaxation). Elevated left ventricular end-diastolic pressure. The average left ventricular global longitudinal strain is -15.2 %. The global longitudinal strain is abnormal.   2. Right ventricular systolic function is normal. The right ventricular  size is normal. There is normal pulmonary artery systolic pressure.   3. Left atrial size was mildly dilated.   4. A small pericardial effusion is present. The pericardial effusion is  anterior to the right ventricle.   5. The mitral valve is normal in structure. Trivial mitral valve  regurgitation. No evidence of mitral stenosis.   6. The aortic valve is normal in structure. Aortic valve regurgitation is  not visualized. No aortic stenosis is present.   7. Aortic dilatation noted. There is mild dilatation of the aortic root,  measuring 41 mm. There is mild dilatation of the ascending aorta,  measuring 38 mm.   8. The inferior vena cava is normal in size with greater than 50%  respiratory variability, suggesting right atrial pressure of 3 mmHg.  Comparison(s): 02/20/21 EF 40-45%. PA pressure 29mHg.   [place] 04/10/2021: IMPRESSION: 1. Indeterminate right supraclavicular soft tissue mass. No evidence of cervical lymphadenopathy. The mass would be amenable  to percutaneous biopsy as clinically indicated. 2. Normal appearance of the thyroid gland.  Echo 02/20/2021: 1. Left ventricular ejection fraction, by estimation, is 40 to 45%. The  left ventricle has mildly decreased function. The left ventricle  demonstrates global hypokinesis. There is severe left ventricular  hypertrophy. Left ventricular diastolic parameters   are consistent with Grade II diastolic dysfunction (pseudonormalization).  Elevated left atrial pressure.   2. Right ventricular systolic function is mildly reduced. The right  ventricular size is normal. There is normal pulmonary artery systolic  pressure. The estimated right ventricular systolic pressure is 259.2mmHg.   3. Left atrial size was moderately dilated.   4. The mitral valve is normal in structure. No evidence of mitral valve  regurgitation. No evidence of mitral stenosis.   5. The aortic valve is tricuspid. Aortic valve regurgitation is not  visualized. Mild aortic valve sclerosis is present, with no evidence of  aortic valve stenosis.   6. The inferior vena cava is normal in size with <50% respiratory  variability, suggesting right atrial pressure of 8 mmHg.   Comparison(s): Compared to prior echo, LV systolic function has improved.   [place]: Mildly elevated filling pressures, mild to moderate pulmonary hypertension and mildly reduced cardiac output.   RA: 8 mmHg RV: 53/3 mmHg PCW 15 mmHg PA: 50/17 mmHg with a mean of 29   AO sat 97% PA sat 60% Cardiac output: 3.92 with a cardiac index of 2.15.  Echo 07/10/20:  1. Left ventricular ejection fraction, by estimation, is 30 to 35%. The  left ventricle has moderately decreased function. The left ventricle  demonstrates global hypokinesis. There is moderate concentric left  ventricular hypertrophy. Left ventricular  diastolic parameters are consistent with Grade II diastolic dysfunction  (pseudonormalization). Elevated left atrial pressure.   2.  Right ventricular systolic function is moderately reduced. The right  ventricular size is mildly enlarged. Mildly increased right ventricular  wall thickness. There is moderately elevated pulmonary artery systolic  pressure. The estimated right  ventricular systolic pressure is 592.4mmHg.   3. Left atrial size was severely  dilated.   4. Right atrial size was moderately dilated.   5. The mitral valve is normal in structure. Mild mitral valve  regurgitation.   6. Tricuspid valve regurgitation is moderate.   7. The aortic valve is tricuspid. Aortic valve regurgitation is not  visualized. No aortic stenosis is present.   8. The inferior vena cava is dilated in size with <50% respiratory  variability, suggesting right atrial pressure of 15 mmHg.   LHC 11/15/18:  Prox LAD to Mid LAD lesion is 40% stenosed. Mid Cx to Dist Cx lesion is 25% stenosed. Ost 2nd Diag to 2nd Diag lesion is 50% stenosed. Ost 3rd Mrg lesion is 25% stenosed. Mid LAD lesion is 90% stenosed. A drug-eluting stent was successfully placed using a STENT SYNERGY DES 2.5X16. Post intervention, there is a 0% residual stenosis. LV end diastolic pressure is normal. There is no aortic valve stenosis. Ao 92%, PA 66%, mean PA 17 mm Hg, mean PCWP 3 mm Hg, CO 5.1; CI 2.8- normal right heart pressures Tortuous right subclavian. 3DRC was used for RCA.   Continue aggressive secondary prevention in addition to dual antiplatelet therapy.  Adequate diuresis based on right heart pressures.  Continue medical therapy for heart failure.    Recent Labs: 07/23/2022: ALT 18; BUN 19; Creatinine 1.06; Hemoglobin 11.4; Platelet Count 163; Potassium 4.4; Sodium 143    Lipid Panel    Component Value Date/Time   CHOL 103 01/16/2021 1705   TRIG 66 01/16/2021 1705   HDL 47 01/16/2021 1705   CHOLHDL 2.2 01/16/2021 1705   CHOLHDL 3.6 07/12/2020 0510   VLDL 13 07/12/2020 0510   LDLCALC 42 01/16/2021 1705      Wt Readings from Last 3  Encounters:  08/07/22 155 lb 4.8 oz (70.4 kg)  07/23/22 151 lb (68.5 kg)  04/21/22 146 lb (66.2 kg)     ASSESSMENT AND PLAN:  Chronic combined systolic and diastolic heart failure (HCC) LVEF 40-45%.  Breathing is stable.  He is feeling generally fatigued.  Continue carvedilol, furosemide, hydralazine, Jardiance, spironolactone, and Entresto.  Resistant hypertension BP very elevated today.  However he did not take any of his blood pressure medication today.  He also has not been taking carvedilol twice a day and at least a week.  He reports that he checks his blood pressure at home but is unable to give any specific values other than stating that his blood pressures are pretty good.  Not have been as high as they are today.  Recommended that he start back taking his medications as prescribed today and follow-up in 1 month.  However he is hesitant to come back so soon.  He will resume carvedilol, hydralazine, Entresto, and spironolactone.  Blood pressure goal is less than 130/80.  He will call us in 1 week to let us know what his blood pressures are running.  CAD in native artery This post PCI.  He is not having any angina.  Continue aspirin and statin.  Resume carvedilol.  Pure hypercholesterolemia LDL goal is less than 70.  He will come back for fasting lipids and a CMP.  Current medicines are reviewed at length with the patient today.  The patient does not have concerns regarding medicines.  The following changes have been made: Switch Farxiga to [place]  Labs/ tests ordered today include:   Orders Placed This Encounter  Procedures   Lipid panel   Comprehensive metabolic panel    Disposition:    FU with Renee Erb C. Oval Linsey,  MD, [facility] in 4 months, call to report BP numbers in 1 week.  I,Coren O'Brien,acting as a Education administrator for [place], MD.,have documented all relevant documentation on the behalf of [name], MD,as directed by  [name], MD while in the  presence of [name], MD.  I, [place] Oval Linsey, MD have reviewed all documentation for this visit.  The documentation of the exam, diagnosis, procedures, and orders on 08/07/2022 are all accurate and complete.   Signed, Nickolus Wadding C. Oval Linsey, MD, [facility]  08/07/2022 4:08 PM    [facility]

## 2022-08-07 NOTE — Assessment & Plan Note (Signed)
BP very elevated today.  However he did not take any of his blood pressure medication today.  He also has not been taking carvedilol twice a day and at least a week.  He reports that he checks his blood pressure at home but is unable to give any specific values other than stating that his blood pressures are pretty good.  Not have been as high as they are today.  Recommended that he start back taking his medications as prescribed today and follow-up in 1 month.  However he is hesitant to come back so soon.  He will resume carvedilol, hydralazine, Entresto, and spironolactone.  Blood pressure goal is less than 130/80.  He will call us in 1 week to let us know what his blood pressures are running.

## 2022-08-07 NOTE — Assessment & Plan Note (Signed)
LDL goal is less than 70.  He will come back for fasting lipids and a CMP.

## 2022-08-19 ENCOUNTER — Encounter (INDEPENDENT_AMBULATORY_CARE_PROVIDER_SITE_OTHER): Payer: Medicare Other | Admitting: Ophthalmology

## 2022-08-19 DIAGNOSIS — H25813 Combined forms of age-related cataract, bilateral: Secondary | ICD-10-CM

## 2022-08-19 DIAGNOSIS — E113511 Type 2 diabetes mellitus with proliferative diabetic retinopathy with macular edema, right eye: Secondary | ICD-10-CM

## 2022-08-19 DIAGNOSIS — H35033 Hypertensive retinopathy, bilateral: Secondary | ICD-10-CM

## 2022-08-19 DIAGNOSIS — E113412 Type 2 diabetes mellitus with severe nonproliferative diabetic retinopathy with macular edema, left eye: Secondary | ICD-10-CM

## 2022-08-19 DIAGNOSIS — I10 Essential (primary) hypertension: Secondary | ICD-10-CM

## 2022-09-02 NOTE — Progress Notes (Shared)
[facility] Note  09/03/2022    CHIEF COMPLAINT Patient presents for No chief complaint on file.  HISTORY OF PRESENT ILLNESS: Earl Calderon is a 72 y.o. male who presents to the clinic today for:      Referring physician: [name], MD [address]  [address] [place],  [place] [address]  HISTORICAL INFORMATION:   Selected notes from the MEDICAL RECORD NUMBER Referred by Dr. [name] for severe NPDR OU   CURRENT MEDICATIONS: No current outpatient medications on file. (Ophthalmic Drugs)   No current facility-administered medications for this visit. (Ophthalmic Drugs)   Current Outpatient Medications (Other)  Medication Sig   APPLE CIDER VINEGAR PO Take 1 tablet by mouth daily at 6 (six) AM.   ASPIRIN LOW DOSE 81 MG EC tablet TAKE 1 TABLET BY MOUTH ONCE DAILY (SWALLOW WHOLE)   carvedilol (COREG) 12.5 MG tablet TAKE ONE TABLET BY MOUTH TWICE DAILY WITH A MEAL   ELDERBERRY PO Take 100 mg by mouth daily.   ENTRESTO 97-103 MG TAKE 1 TABLET BY MOUTH TWICE DAILY   ferrous sulfate 325 (65 FE) MG tablet Take 1 tablet (325 mg total) by mouth daily with breakfast.   furosemide (LASIX) 40 MG tablet TAKE 1 TABLET BY MOUTH ONCE DAILY   glucose blood (FREESTYLE TEST STRIPS) test strip Use as instructed   HUMALOG KWIKPEN 100 UNIT/ML KwikPen Inject into the skin. Sliding Scale   hydrALAZINE (APRESOLINE) 25 MG tablet TAKE 1 TABLET BY MOUTH IN THE MORNING, AND TAKE 1 TABLET AT BEDTIME   Insulin Pen Needle (NOVOFINE PEN NEEDLE) 32G X 6 MM MISC USE AS DIRECTED   JARDIANCE 10 MG TABS tablet TAKE ONE TABLET BY MOUTH DAILY BEFORE BREAKFAST   LEVEMIR FLEXTOUCH 100 UNIT/ML FlexPen Inject 8 Units into the skin 2 (two) times daily.   nitroGLYCERIN (NITROSTAT) 0.4 MG SL tablet Place 1 tablet (0.4 mg total) under the tongue every 5 (five) minutes as needed for chest pain.   Probiotic Product (PROBIOTIC MULTI-ENZYME) TABS Take 3 tablets by mouth daily at 6  (six) AM.   rosuvastatin (CRESTOR) 40 MG tablet TAKE 1 TABLET BY MOUTH ONCE DAILY AT 6PM   spironolactone (ALDACTONE) 25 MG tablet TAKE 1 TABLET BY MOUTH ONCE DAILY   Turmeric 500 MG TABS Take 2 tablets by mouth daily at 6 (six) AM.   No current facility-administered medications for this visit. (Other)   REVIEW OF SYSTEMS:    ALLERGIES No Known Allergies  PAST MEDICAL HISTORY Past Medical History:  Diagnosis Date   CAD in native artery 01/01/2021   Prior LAD PCI.  Currently no symptoms of ischemia.  Encouraged him to do some light exercising.  Continue aspirin, carvedilol, and rosuvastatin.   Cataract    CKD (chronic kidney disease), stage III ([place]) 08/24/2020   Coronary artery disease    Diabetes mellitus without complication ([place])    on meds   Diabetic retinopathy ([place])    Goals of care, counseling/discussion 07/01/2021   Hernia, inguinal    Hypertension    on meds   Hypertensive retinopathy    Lymphadenopathy, cervical 07/01/2021   MGUS (monoclonal gammopathy of unknown significance) 07/01/2021   NSVT (nonsustained ventricular tachycardia) ([place]) 08/24/2020   Pure hypercholesterolemia 08/05/2021   Resistant hypertension 11/11/2018   Uncontrolled type 2 diabetes mellitus 11/11/2018   Past Surgical History:  Procedure Laterality Date   CORONARY STENT INTERVENTION N/A 11/15/2018   Procedure: CORONARY STENT INTERVENTION;  Surgeon:  [name], MD;  Location: [facility];  Service: Cardiovascular;  Laterality: N/A;   HEMORROIDECTOMY     RIGHT HEART CATH N/A 07/18/2020   Procedure: RIGHT HEART CATH;  Surgeon: [name], MD;  Location: [facility];  Service: Cardiovascular;  Laterality: N/A;   RIGHT/LEFT HEART CATH AND CORONARY ANGIOGRAPHY N/A 11/15/2018   Procedure: RIGHT/LEFT HEART CATH AND CORONARY ANGIOGRAPHY;  Surgeon: [name], MD;  Location: [facility];  Service: Cardiovascular;  Laterality: N/A;   WISDOM TOOTH  EXTRACTION     FAMILY HISTORY Family History  Problem Relation Age of Onset   Hypertension Mother    Diabetes Mellitus II Mother    Arrhythmia Mother    Heart disease Mother    Hypertension Sister    Hypertension Brother    Heart disease Brother    Kidney disease Brother    Hypertension Other    Diabetes Mellitus II Other        All 9 sibblings have HTN   Colon cancer Neg Hx    Colon polyps Neg Hx    Esophageal cancer Neg Hx    Stomach cancer Neg Hx    Rectal cancer Neg Hx    SOCIAL HISTORY Social History   Tobacco Use   Smoking status: Never   Smokeless tobacco: Never  Vaping Use   Vaping Use: Never used  Substance Use Topics   Alcohol use: Not Currently   Drug use: Never       OPHTHALMIC EXAM:  Not recorded    IMAGING AND PROCEDURES  Imaging and Procedures for 09/03/2022           ASSESSMENT/PLAN:  No diagnosis found.   1,2. Proliferative diabetic retinopathy, OD  - severe NPDR OS  - delayed f/u today -- 7 wks instead of 6 due to transportation - delayed to follow up from 10.28.22 to 01.04.23 (9+ wks) - s/p IVA OU #1 (06.06.22), #2 (07.08.22), #3 (08.05.22), #4 (09.02.22), #5 (09.30.22), #6 (10.28.22), #7 (01.04.23), #8 (02.20.33), #9 (04.03.23), #10 (05.15.23), #11 (06.28.23), #12 (08.16.23), #13 (10.10.23), #14 (11.14.23) **h/o increased IRF/DME OU at 7 wks, noted on 08.16.23**  - s/p PRP OD (06.10.22) - exam shows central edema OU, scattered IRH -- improving - FA (06.06.22) shows OD: focal NVE IN arcades; OS: no NV OS; late leaking MA's OU -- s/p PRP OD 6.10.22 - OCT shows OD: mild interval improvement in central IRF and foveal contour, partial PVD; OS: mild interval improvement in IRF temporal fovea and macula, patchy ORA - BCVA -- OD 20/30 (improved from 20/40); OS 20/60 (stable) - recommend IVA OU #15 today, 01.03.24 w/ f/u in 5-6 wks - pt wishes to proceed - RBA of procedure discussed, questions answered - IVA informed consent obtained and  re-signed (OU), 06.28.23 - see procedure note - f/u 5-6 weeks, DFE/OCT, possible injection(s)  3,4. Hypertensive retinopathy OU - discussed importance of tight BP control - monitor  5. Mixed Cataract OU - The symptoms of cataract, surgical options, and treatments and risks were discussed with patient. - discussed diagnosis and progression - under the expert management of Dr. Wyatt Portela - clear from a retina standpoint to proceed with cataract surgery when both patient and surgeon are ready - cataract evaluation scheduled for December 2023  Ophthalmic Meds Ordered this visit:  No orders of the defined types were placed in this encounter.    No follow-ups on file.  There are no Patient Instructions on file  for this visit.  This document serves as a record of services personally performed by Gardiner Sleeper, MD, PhD. It was created on their behalf by Orvan Falconer, an ophthalmic technician. The creation of this record is the provider's dictation and/or activities during the visit.    Electronically signed by: Orvan Falconer, OA, 09/02/22  9:39 AM   Gardiner Sleeper, M.D., Ph.D. Diseases & Surgery of the Retina and Vitreous [facility]: M myopia (nearsighted); A astigmatism; H hyperopia (farsighted); P presbyopia; Mrx spectacle prescription;  CTL contact lenses; OD right eye; OS left eye; OU both eyes  XT exotropia; ET esotropia; PEK punctate epithelial keratitis; PEE punctate epithelial erosions; DES dry eye syndrome; MGD meibomian gland dysfunction; ATs artificial tears; PFAT's preservative free artificial tears; [place] nuclear sclerotic cataract; PSC posterior subcapsular cataract; ERM epi-retinal membrane; PVD posterior vitreous detachment; RD retinal detachment; DM diabetes mellitus; DR diabetic retinopathy; NPDR non-proliferative diabetic retinopathy; PDR proliferative diabetic retinopathy; CSME clinically significant macular edema; DME  diabetic macular edema; dbh dot blot hemorrhages; CWS cotton wool spot; POAG primary open angle glaucoma; C/D cup-to-disc ratio; HVF humphrey visual field; GVF goldmann visual field; OCT optical coherence tomography; IOP intraocular pressure; BRVO Branch retinal vein occlusion; CRVO central retinal vein occlusion; CRAO central retinal artery occlusion; BRAO branch retinal artery occlusion; RT retinal tear; SB scleral buckle; PPV pars plana vitrectomy; VH Vitreous hemorrhage; PRP panretinal laser photocoagulation; IVK intravitreal kenalog; VMT vitreomacular traction; MH Macular hole;  NVD neovascularization of the disc; NVE neovascularization elsewhere; AREDS age related eye disease study; ARMD age related macular degeneration; POAG primary open angle glaucoma; EBMD epithelial/anterior basement membrane dystrophy; ACIOL anterior chamber intraocular lens; IOL intraocular lens; PCIOL posterior chamber intraocular lens; Phaco/IOL phacoemulsification with intraocular lens placement; [place] photorefractive keratectomy; LASIK laser assisted in situ keratomileusis; HTN hypertension; DM diabetes mellitus; COPD chronic obstructive pulmonary disease

## 2022-09-03 ENCOUNTER — Encounter (INDEPENDENT_AMBULATORY_CARE_PROVIDER_SITE_OTHER): Payer: Medicare Other | Admitting: Ophthalmology

## 2022-09-10 NOTE — Progress Notes (Addendum)
[facility] Note  09/15/2022    CHIEF COMPLAINT Patient presents for Retina Follow Up  HISTORY OF PRESENT ILLNESS: Earl Calderon is a 72 y.o. male who presents to the clinic today for:   HPI     Retina Follow Up   Patient presents with  Diabetic Retinopathy.  In right eye.  Severity is moderate.  Duration of 9 weeks.  Since onset it is stable.  I, the attending physician,  performed the HPI with the patient and updated documentation appropriately.        Comments   Pt here for 9 wk ret f/u PDR OD. Pt states VA the same, no changes.       Last edited by [name], MD on 09/15/2022  5:15 PM.     Referring physician: Lisabeth Pick, MD [place],  [place] [id number]  HISTORICAL INFORMATION:   Selected notes from the MEDICAL RECORD NUMBER Referred by Dr. [name] for severe NPDR OU   CURRENT MEDICATIONS: No current outpatient medications on file. (Ophthalmic Drugs)   No current facility-administered medications for this visit. (Ophthalmic Drugs)   Current Outpatient Medications (Other)  Medication Sig   APPLE CIDER VINEGAR PO Take 1 tablet by mouth daily at 6 (six) AM.   ASPIRIN LOW DOSE 81 MG EC tablet TAKE 1 TABLET BY MOUTH ONCE DAILY (SWALLOW WHOLE)   carvedilol (COREG) 12.5 MG tablet TAKE ONE TABLET BY MOUTH TWICE DAILY WITH A MEAL   ELDERBERRY PO Take 100 mg by mouth daily.   ENTRESTO 97-103 MG TAKE 1 TABLET BY MOUTH TWICE DAILY   ferrous sulfate 325 (65 FE) MG tablet Take 1 tablet (325 mg total) by mouth daily with breakfast.   furosemide (LASIX) 40 MG tablet TAKE 1 TABLET BY MOUTH ONCE DAILY   glucose blood (FREESTYLE TEST STRIPS) test strip Use as instructed   HUMALOG KWIKPEN 100 UNIT/ML KwikPen Inject into the skin. Sliding Scale   hydrALAZINE (APRESOLINE) 25 MG tablet TAKE 1 TABLET BY MOUTH IN THE MORNING, AND TAKE 1 TABLET AT BEDTIME   Insulin Pen Needle (NOVOFINE PEN NEEDLE) 32G X 6 MM MISC USE AS DIRECTED    JARDIANCE 10 MG TABS tablet TAKE ONE TABLET BY MOUTH DAILY BEFORE BREAKFAST   LEVEMIR FLEXTOUCH 100 UNIT/ML FlexPen Inject 8 Units into the skin 2 (two) times daily.   Probiotic Product (PROBIOTIC MULTI-ENZYME) TABS Take 3 tablets by mouth daily at 6 (six) AM.   rosuvastatin (CRESTOR) 40 MG tablet TAKE 1 TABLET BY MOUTH ONCE DAILY AT 6PM   spironolactone (ALDACTONE) 25 MG tablet TAKE 1 TABLET BY MOUTH ONCE DAILY   Turmeric 500 MG TABS Take 2 tablets by mouth daily at 6 (six) AM.   nitroGLYCERIN (NITROSTAT) 0.4 MG SL tablet Place 1 tablet (0.4 mg total) under the tongue every 5 (five) minutes as needed for chest pain.   No current facility-administered medications for this visit. (Other)   REVIEW OF SYSTEMS: ROS   Positive for: Endocrine, Cardiovascular, Eyes Negative for: Constitutional, Gastrointestinal, Neurological, Skin, Genitourinary, Musculoskeletal, HENT, Respiratory, Psychiatric, Allergic/Imm, Heme/Lymph Last edited by Kingsley Spittle, COT on 09/15/2022  1:28 PM.     ALLERGIES No Known Allergies  PAST MEDICAL HISTORY Past Medical History:  Diagnosis Date   CAD in native artery 01/01/2021   Prior LAD PCI.  Currently no symptoms of ischemia.  Encouraged him to do some light exercising.  Continue aspirin, carvedilol, and rosuvastatin.   Cataract  CKD (chronic kidney disease), stage III ([place]) 08/24/2020   Coronary artery disease    Diabetes mellitus without complication ([place])    on meds   Diabetic retinopathy ([place])    Goals of care, counseling/discussion 07/01/2021   Hernia, inguinal    Hypertension    on meds   Hypertensive retinopathy    Lymphadenopathy, cervical 07/01/2021   MGUS (monoclonal gammopathy of unknown significance) 07/01/2021   NSVT (nonsustained ventricular tachycardia) ([place]) 08/24/2020   Pure hypercholesterolemia 08/05/2021   Resistant hypertension 11/11/2018   Uncontrolled type 2 diabetes mellitus 11/11/2018   Past Surgical History:  Procedure  Laterality Date   CORONARY STENT INTERVENTION N/A 11/15/2018   Procedure: CORONARY STENT INTERVENTION;  Surgeon: [name], MD;  Location: [facility];  Service: Cardiovascular;  Laterality: N/A;   HEMORROIDECTOMY     RIGHT HEART CATH N/A 07/18/2020   Procedure: RIGHT HEART CATH;  Surgeon: [name], MD;  Location: [facility];  Service: Cardiovascular;  Laterality: N/A;   RIGHT/LEFT HEART CATH AND CORONARY ANGIOGRAPHY N/A 11/15/2018   Procedure: RIGHT/LEFT HEART CATH AND CORONARY ANGIOGRAPHY;  Surgeon: [name], MD;  Location: [facility];  Service: Cardiovascular;  Laterality: N/A;   WISDOM TOOTH EXTRACTION     FAMILY HISTORY Family History  Problem Relation Age of Onset   Hypertension Mother    Diabetes Mellitus II Mother    Arrhythmia Mother    Heart disease Mother    Hypertension Sister    Hypertension Brother    Heart disease Brother    Kidney disease Brother    Hypertension Other    Diabetes Mellitus II Other        All 9 sibblings have HTN   Colon cancer Neg Hx    Colon polyps Neg Hx    Esophageal cancer Neg Hx    Stomach cancer Neg Hx    Rectal cancer Neg Hx    SOCIAL HISTORY Social History   Tobacco Use   Smoking status: Never   Smokeless tobacco: Never  Vaping Use   Vaping Use: Never used  Substance Use Topics   Alcohol use: Not Currently   Drug use: Never       OPHTHALMIC EXAM:  Base Eye Exam     Visual Acuity (Snellen - Linear)       Right Left   Dist [place] 20/30 20/60 +1   Dist ph [place] NI NI         Tonometry (Tonopen, 1:33 PM)       Right Left   Pressure 12 10         Pupils       Pupils Dark Light Shape React APD   Right PERRL 3 2 Round Brisk None   Left PERRL 3 2 Round Brisk None         Visual Fields (Counting fingers)       Left Right   Restrictions Partial outer superior temporal deficiency Partial outer superior nasal deficiency         Extraocular Movement        Right Left    Full, Ortho Full, Ortho         Neuro/Psych     Oriented x3: Yes   Mood/Affect: Normal         Dilation     Both eyes: 1.0% Mydriacyl, 2.5% Phenylephrine @ 1:34 PM           Slit Lamp and Fundus Exam  Slit Lamp Exam       Right Left   Lids/Lashes Dermatochalasis - upper lid Dermatochalasis - upper lid   Conjunctiva/Sclera Melanosis Melanosis   Cornea arcus, fine endo pigment arcus   Anterior Chamber deep and clear, no cell or flare deep and clear   Iris Round and dilated, No NVI Round and dilated, No NVI   Lens 2-3+ Nuclear sclerosis, 2-3+ Cortical cataract 2-3+ Nuclear sclerosis, 2-3+ Cortical cataract   Anterior Vitreous Vitreous syneresis Vitreous syneresis         Fundus Exam       Right Left   Disc Pink and Sharp, no NVD, +PPP Pink and Sharp, no NVD, +PPP   C/D Ratio 0.6 0.4   Macula good foveal reflex, central edema -- persistent, scattered MA/DBH - improved, focal exudates temporal macula - improved good foveal reflex, scattered MA/DBH, +edema temporal macula, +exudates and CWS -- improved   Vessels attenuated, tortuous, focal NV IN arcades--regressing, mild copper wiring attenuated, Tortuous   Periphery Attached, 360 MA/DBH greatest posteriorly, light 360 PRP changes with room for fill in if needed Attached, 360 DBH           IMAGING AND PROCEDURES  Imaging and Procedures for 09/15/2022  OCT, Retina - OU - Both Eyes       Right Eye Quality was good. Central Foveal Thickness: 267. Progression has been stable. Findings include no SRF, abnormal foveal contour, intraretinal hyper-reflective material, intraretinal fluid, vitreomacular adhesion (Persistent central IRF, partial PVD).   Left Eye Quality was good. Central Foveal Thickness: 203. Progression has worsened. Findings include normal foveal contour, no SRF, intraretinal hyper-reflective material, intraretinal fluid, vitreomacular adhesion (Mild interval increase in IRF temporal  fovea and macula, patchy ORA).   Notes *Images captured and stored on drive  Diagnosis / Impression:  +DME OU OD: Persistent central IRF, partial PVD OS: Mild interval increase in IRF temporal fovea and macula, patchy ORA  Clinical management:  See below  Abbreviations: NFP - Normal foveal profile. CME - cystoid macular edema. PED - pigment epithelial detachment. IRF - intraretinal fluid. SRF - subretinal fluid. EZ - ellipsoid zone. ERM - epiretinal membrane. ORA - outer retinal atrophy. ORT - outer retinal tubulation. SRHM - subretinal hyper-reflective material. IRHM - intraretinal hyper-reflective material      Intravitreal Injection, Pharmacologic Agent - OD - Right Eye       Time Out 09/15/2022. 2:36 PM. Confirmed correct patient, procedure, site, and patient consented.   Anesthesia Topical anesthesia was used. Anesthetic medications included Lidocaine 2%, Proparacaine 0.5%.   Procedure Preparation included 5% betadine to ocular surface, eyelid speculum. A (32g) needle was used.   Injection: 1.25 mg Bevacizumab 1.'25mg'$ /0.48m   Route: Intravitreal, Site: Right Eye   NDC: [id number] Lot:: [id number] Expiration date: 09/30/2022   Post-op Post injection exam found visual acuity of at least counting fingers. The patient tolerated the procedure well. There were no complications. The patient received written and verbal post procedure care education. Post injection medications were not given.      Intravitreal Injection, Pharmacologic Agent - OS - Left Eye       Time Out 09/15/2022. 2:36 PM. Confirmed correct patient, procedure, site, and patient consented.   Anesthesia Topical anesthesia was used. Anesthetic medications included Lidocaine 2%, Proparacaine 0.5%.   Procedure Preparation included 5% betadine to ocular surface, eyelid speculum. A (32g) needle was used.   Injection: 1.25 mg Bevacizumab 1.'25mg'$ /0.053m  Route: Intravitreal, Site: Left Eye   NDC:  64332-951-88, Lot: [id number], Expiration date: 10/31/2022   Post-op Post injection exam found visual acuity of at least counting fingers. The patient tolerated the procedure well. There were no complications. The patient received written and verbal post procedure care education. Post injection medications were not given.            ASSESSMENT/PLAN:    ICD-10-CM   1. Proliferative diabetic retinopathy of right eye with macular edema associated with type 2 diabetes mellitus (HCC)  E11.3511 OCT, Retina - OU - Both Eyes    Intravitreal Injection, Pharmacologic Agent - OD - Right Eye    Intravitreal Injection, Pharmacologic Agent - OS - Left Eye    Bevacizumab (AVASTIN) SOLN 1.25 mg    Bevacizumab (AVASTIN) SOLN 1.25 mg    2. Severe nonproliferative diabetic retinopathy of left eye with macular edema associated with type 2 diabetes mellitus (HCC)  K16.0109 OCT, Retina - OU - Both Eyes    3. Essential hypertension  I10     4. Hypertensive retinopathy of both eyes  H35.033     5. Combined forms of age-related cataract of both eyes  H25.813      1,2. Proliferative diabetic retinopathy OD  - severe NPDR OS  - delayed f/u today -- 8 wks instead of 5-6 due to transportation - delayed to follow up from 10.28.22 to 01.04.23 (9+ wks) - s/p IVA OU #1 (06.06.22), #2 (07.08.22), #3 (08.05.22), #4 (09.02.22), #5 (09.30.22), #6 (10.28.22), #7 (01.04.23), #8 (02.20.33), #9 (04.03.23), #10 (05.15.23), #11 (06.28.23), #12 (08.16.23), #13 (10.10.23), #14 (11.14.23) **h/o increased IRF/DME OU at 7 wks, noted on 08.16.23**  - s/p PRP OD (06.10.22) - exam shows central edema OU, scattered IRH -- improving - FA (06.06.22) shows OD: focal NVE IN arcades; OS: no NV OS; late leaking MA's OU -- s/p PRP OD 6.10.22 - OCT shows OD: Persistent central IRF, partial PVD; OS: Mild interval increase in IRF temporal fovea and macula, patchy ORA at 8 weeks - BCVA -- OD 20/30 (stable); OS 20/60 (stable) - recommend IVA OU  #15 today, 01.15.24 w/ f/u back to 6 wks - pt wishes to proceed - RBA of procedure discussed, questions answered - IVA informed consent obtained and re-signed (OU), 06.28.23 - see procedure note - f/u 6 weeks, DFE/OCT, possible injection(s)  3,4. Hypertensive retinopathy OU - discussed importance of tight BP control - monitor  5. Mixed Cataract OU - The symptoms of cataract, surgical options, and treatments and risks were discussed with patient. - discussed diagnosis and progression - clear from a retina standpoint to proceed with cataract surgery when both patient and surgeon are ready - pt has surgery scheduled at the end of the month with Dr. Lucianne Lei  Ophthalmic Meds Ordered this visit:  Meds ordered this encounter  Medications   Bevacizumab (AVASTIN) SOLN 1.25 mg   Bevacizumab (AVASTIN) SOLN 1.25 mg     Return in about 6 weeks (around 10/27/2022) for f/u 6 weeks, PDR OU, DFE, OCT.  There are no Patient Instructions on file for this visit.  This document serves as a record of services personally performed by Gardiner Sleeper, MD, PhD. It was created on their behalf by Orvan Falconer, an ophthalmic technician. The creation of this record is the provider's dictation and/or activities during the visit.    Electronically signed by: Orvan Falconer, OA, 09/17/22  5:16 PM  This document serves as a record of services personally performed by Gardiner Sleeper, MD, PhD. It was created on their  behalf by San Jetty. Owens Shark, OA an ophthalmic technician. The creation of this record is the provider's dictation and/or activities during the visit.    Electronically signed by: San Jetty. Owens Shark, [place] 01.15.2024 5:16 PM  Gardiner Sleeper, M.D., Ph.D. Diseases & Surgery of the Retina and Vitreous [facility]  I have reviewed the above documentation for accuracy and completeness, and I agree with the above. Gardiner Sleeper, M.D., Ph.D. 09/17/22 5:16 PM  Abbreviations: M myopia  (nearsighted); A astigmatism; H hyperopia (farsighted); P presbyopia; Mrx spectacle prescription;  CTL contact lenses; OD right eye; OS left eye; OU both eyes  XT exotropia; ET esotropia; PEK punctate epithelial keratitis; PEE punctate epithelial erosions; DES dry eye syndrome; MGD meibomian gland dysfunction; ATs artificial tears; PFAT's preservative free artificial tears; [place] nuclear sclerotic cataract; PSC posterior subcapsular cataract; ERM epi-retinal membrane; PVD posterior vitreous detachment; RD retinal detachment; DM diabetes mellitus; DR diabetic retinopathy; NPDR non-proliferative diabetic retinopathy; PDR proliferative diabetic retinopathy; CSME clinically significant macular edema; DME diabetic macular edema; dbh dot blot hemorrhages; CWS cotton wool spot; POAG primary open angle glaucoma; C/D cup-to-disc ratio; HVF humphrey visual field; GVF goldmann visual field; OCT optical coherence tomography; IOP intraocular pressure; BRVO Branch retinal vein occlusion; CRVO central retinal vein occlusion; CRAO central retinal artery occlusion; BRAO branch retinal artery occlusion; RT retinal tear; SB scleral buckle; PPV pars plana vitrectomy; VH Vitreous hemorrhage; PRP panretinal laser photocoagulation; IVK intravitreal kenalog; VMT vitreomacular traction; MH Macular hole;  NVD neovascularization of the disc; NVE neovascularization elsewhere; AREDS age related eye disease study; ARMD age related macular degeneration; POAG primary open angle glaucoma; EBMD epithelial/anterior basement membrane dystrophy; ACIOL anterior chamber intraocular lens; IOL intraocular lens; PCIOL posterior chamber intraocular lens; Phaco/IOL phacoemulsification with intraocular lens placement; [place] photorefractive keratectomy; LASIK laser assisted in situ keratomileusis; HTN hypertension; DM diabetes mellitus; COPD chronic obstructive pulmonary disease

## 2022-09-15 ENCOUNTER — Ambulatory Visit (INDEPENDENT_AMBULATORY_CARE_PROVIDER_SITE_OTHER): Payer: 59 | Admitting: Ophthalmology

## 2022-09-15 ENCOUNTER — Encounter (INDEPENDENT_AMBULATORY_CARE_PROVIDER_SITE_OTHER): Payer: Self-pay | Admitting: Ophthalmology

## 2022-09-15 DIAGNOSIS — E113511 Type 2 diabetes mellitus with proliferative diabetic retinopathy with macular edema, right eye: Secondary | ICD-10-CM | POA: Diagnosis not present

## 2022-09-15 DIAGNOSIS — H35033 Hypertensive retinopathy, bilateral: Secondary | ICD-10-CM

## 2022-09-15 DIAGNOSIS — E113412 Type 2 diabetes mellitus with severe nonproliferative diabetic retinopathy with macular edema, left eye: Secondary | ICD-10-CM | POA: Diagnosis not present

## 2022-09-15 DIAGNOSIS — I10 Essential (primary) hypertension: Secondary | ICD-10-CM

## 2022-09-15 DIAGNOSIS — H25813 Combined forms of age-related cataract, bilateral: Secondary | ICD-10-CM

## 2022-09-15 DIAGNOSIS — S0591XA Unspecified injury of right eye and orbit, initial encounter: Secondary | ICD-10-CM

## 2022-09-15 MED ORDER — BEVACIZUMAB CHEMO INJECTION 1.25MG/0.05ML SYRINGE FOR KALEIDOSCOPE
1.2500 mg | INTRAVITREAL | Status: AC | PRN
  Administered 2022-09-15: 1.25 mg via INTRAVITREAL

## 2022-10-03 ENCOUNTER — Other Ambulatory Visit (HOSPITAL_BASED_OUTPATIENT_CLINIC_OR_DEPARTMENT_OTHER): Payer: Self-pay | Admitting: Cardiovascular Disease

## 2022-10-03 NOTE — Telephone Encounter (Signed)
Rx request sent to pharmacy.  

## 2022-10-20 NOTE — Progress Notes (Addendum)
[facility] Note  10/27/2022    CHIEF COMPLAINT Patient presents for Retina Follow Up  HISTORY OF PRESENT ILLNESS: Earl Calderon is a 72 y.o. male who presents to the clinic today for:   HPI     Retina Follow Up   Patient presents with  Diabetic Retinopathy.  In both eyes.  This started 6 weeks ago.  Duration of 6 weeks.  Since onset it is stable.  I, the attending physician,  performed the HPI with the patient and updated documentation appropriately.        Comments   6 week retina follow up PDR OU and IVA OU pt states vision seems stable since his last visit he denies flashes or floaters his last glucose reading was little high unsure number       Last edited by [name], MD on 10/27/2022  4:43 PM.      Referring physician: [name], MD [address]  [address] [place],  [place] [address]  HISTORICAL INFORMATION:   Selected notes from the MEDICAL RECORD NUMBER Referred by Dr. [name] for severe NPDR OU   CURRENT MEDICATIONS: No current outpatient medications on file. (Ophthalmic Drugs)   No current facility-administered medications for this visit. (Ophthalmic Drugs)   Current Outpatient Medications (Other)  Medication Sig   APPLE CIDER VINEGAR PO Take 1 tablet by mouth daily at 6 (six) AM.   ASPIRIN LOW DOSE 81 MG EC tablet TAKE 1 TABLET BY MOUTH ONCE DAILY (SWALLOW WHOLE)   carvedilol (COREG) 12.5 MG tablet TAKE ONE TABLET BY MOUTH TWICE DAILY WITH A MEAL   ELDERBERRY PO Take 100 mg by mouth daily.   ENTRESTO 97-103 MG TAKE 1 TABLET BY MOUTH TWICE DAILY   ferrous sulfate 325 (65 FE) MG tablet Take 1 tablet (325 mg total) by mouth daily with breakfast.   furosemide (LASIX) 40 MG tablet TAKE 1 TABLET BY MOUTH ONCE DAILY   glucose blood (FREESTYLE TEST STRIPS) test strip Use as instructed   HUMALOG KWIKPEN 100 UNIT/ML KwikPen Inject into the skin. Sliding Scale   hydrALAZINE (APRESOLINE) 25 MG tablet TAKE 1  TABLET BY MOUTH IN THE MORNING, AND TAKE 1 TABLET AT BEDTIME   Insulin Pen Needle (NOVOFINE PEN NEEDLE) 32G X 6 MM MISC USE AS DIRECTED   JARDIANCE 10 MG TABS tablet TAKE ONE TABLET BY MOUTH DAILY BEFORE BREAKFAST   LEVEMIR FLEXTOUCH 100 UNIT/ML FlexPen Inject 8 Units into the skin 2 (two) times daily.   nitroGLYCERIN (NITROSTAT) 0.4 MG SL tablet Place 1 tablet (0.4 mg total) under the tongue every 5 (five) minutes as needed for chest pain.   Probiotic Product (PROBIOTIC MULTI-ENZYME) TABS Take 3 tablets by mouth daily at 6 (six) AM.   rosuvastatin (CRESTOR) 40 MG tablet TAKE 1 TABLET BY MOUTH ONCE DAILY AT 6PM   spironolactone (ALDACTONE) 25 MG tablet TAKE 1 TABLET BY MOUTH ONCE DAILY   Turmeric 500 MG TABS Take 2 tablets by mouth daily at 6 (six) AM.   No current facility-administered medications for this visit. (Other)   REVIEW OF SYSTEMS: ROS   Positive for: Endocrine, Cardiovascular, Eyes Negative for: Constitutional, Gastrointestinal, Neurological, Skin, Genitourinary, Musculoskeletal, HENT, Respiratory, Psychiatric, Allergic/Imm, Heme/Lymph Last edited by Parthenia Ames, COT on 10/27/2022  1:24 PM.     ALLERGIES No Known Allergies  PAST MEDICAL HISTORY Past Medical History:  Diagnosis Date   CAD in native artery 01/01/2021   Prior LAD PCI.  Currently no symptoms of ischemia.  Encouraged him to do some light exercising.  Continue aspirin, carvedilol, and rosuvastatin.   Cataract    CKD (chronic kidney disease), stage III ([place]) 08/24/2020   Coronary artery disease    Diabetes mellitus without complication ([place])    on meds   Diabetic retinopathy ([place])    Goals of care, counseling/discussion 07/01/2021   Hernia, inguinal    Hypertension    on meds   Hypertensive retinopathy    Lymphadenopathy, cervical 07/01/2021   MGUS (monoclonal gammopathy of unknown significance) 07/01/2021   NSVT (nonsustained ventricular tachycardia) ([place]) 08/24/2020   Pure  hypercholesterolemia 08/05/2021   Resistant hypertension 11/11/2018   Uncontrolled type 2 diabetes mellitus 11/11/2018   Past Surgical History:  Procedure Laterality Date   CORONARY STENT INTERVENTION N/A 11/15/2018   Procedure: CORONARY STENT INTERVENTION;  Surgeon: [name], MD;  Location: [facility];  Service: Cardiovascular;  Laterality: N/A;   HEMORROIDECTOMY     RIGHT HEART CATH N/A 07/18/2020   Procedure: RIGHT HEART CATH;  Surgeon: [name], MD;  Location: [facility];  Service: Cardiovascular;  Laterality: N/A;   RIGHT/LEFT HEART CATH AND CORONARY ANGIOGRAPHY N/A 11/15/2018   Procedure: RIGHT/LEFT HEART CATH AND CORONARY ANGIOGRAPHY;  Surgeon: [name], MD;  Location: [facility];  Service: Cardiovascular;  Laterality: N/A;   WISDOM TOOTH EXTRACTION     FAMILY HISTORY Family History  Problem Relation Age of Onset   Hypertension Mother    Diabetes Mellitus II Mother    Arrhythmia Mother    Heart disease Mother    Hypertension Sister    Hypertension Brother    Heart disease Brother    Kidney disease Brother    Hypertension Other    Diabetes Mellitus II Other        All 9 sibblings have HTN   Colon cancer Neg Hx    Colon polyps Neg Hx    Esophageal cancer Neg Hx    Stomach cancer Neg Hx    Rectal cancer Neg Hx    SOCIAL HISTORY Social History   Tobacco Use   Smoking status: Never   Smokeless tobacco: Never  Vaping Use   Vaping Use: Never used  Substance Use Topics   Alcohol use: Not Currently   Drug use: Never       OPHTHALMIC EXAM:  Base Eye Exam     Visual Acuity (Snellen - Linear)       Right Left   Dist [place] 20/40 -1 20/70   Dist ph [place] NI NI         Tonometry (Tonopen, 1:27 PM)       Right Left   Pressure 14 13         Pupils       Pupils Dark Light Shape React APD   Right PERRL 3 2 Round Brisk None   Left PERRL 3 2 Round Brisk None         Visual Fields       Left Right    Restrictions Partial outer superior temporal deficiency Partial outer superior nasal deficiency         Neuro/Psych     Oriented x3: Yes   Mood/Affect: Normal         Dilation     Both eyes: 2.5% Phenylephrine @ 1:27 PM           Slit Lamp and Fundus Exam  Slit Lamp Exam       Right Left   Lids/Lashes Dermatochalasis - upper lid Dermatochalasis - upper lid   Conjunctiva/Sclera Melanosis Melanosis   Cornea arcus, fine endo pigment arcus   Anterior Chamber deep and clear, no cell or flare deep and clear   Iris Round and dilated, No NVI Round and dilated, No NVI   Lens 2-3+ Nuclear sclerosis, 2-3+ Cortical cataract 2-3+ Nuclear sclerosis, 2-3+ Cortical cataract   Anterior Vitreous Vitreous syneresis Vitreous syneresis         Fundus Exam       Right Left   Disc Pink and Sharp, no NVD, +PPP Pink and Sharp, no NVD, +PPP   C/D Ratio 0.6 0.4   Macula good foveal reflex, central edema -- improved, scattered MA/DBH - improved, focal exudates temporal macula - improved good foveal reflex, scattered MA/DBH, +cystic changes temporal macula, +exudates and CWS -- improved   Vessels attenuated, tortuous, focal NV IN arcades--regressing, mild copper wiring attenuated, Tortuous   Periphery Attached, 360 MA/DBH greatest posteriorly, light 360 PRP changes with room for fill in if needed Attached, 360 DBH           IMAGING AND PROCEDURES  Imaging and Procedures for 10/27/2022  OCT, Retina - OU - Both Eyes       Right Eye Quality was good. Central Foveal Thickness: 249. Progression has improved. Findings include no SRF, abnormal foveal contour, intraretinal hyper-reflective material, intraretinal fluid, vitreomacular adhesion (Interval improvement in central IRF, partial PVD).   Left Eye Quality was good. Central Foveal Thickness: 205. Progression has improved. Findings include normal foveal contour, no SRF, intraretinal hyper-reflective material, intraretinal fluid,  vitreomacular adhesion (Mild interval improvement in IRF/cystic changes temporal fovea and macula, patchy ORA).   Notes *Images captured and stored on drive  Diagnosis / Impression:  +DME OU OD: Interval improvement in central IRF, partial PVD OS: Mild interval improvement in IRF/cystic changes temporal fovea and macula, patchy ORA  Clinical management:  See below  Abbreviations: NFP - Normal foveal profile. CME - cystoid macular edema. PED - pigment epithelial detachment. IRF - intraretinal fluid. SRF - subretinal fluid. EZ - ellipsoid zone. ERM - epiretinal membrane. ORA - outer retinal atrophy. ORT - outer retinal tubulation. SRHM - subretinal hyper-reflective material. IRHM - intraretinal hyper-reflective material      Intravitreal Injection, Pharmacologic Agent - OD - Right Eye       Time Out 10/27/2022. 2:19 PM. Confirmed correct patient, procedure, site, and patient consented.   Anesthesia Topical anesthesia was used. Anesthetic medications included Lidocaine 2%, Proparacaine 0.5%.   Procedure Preparation included 5% betadine to ocular surface, eyelid speculum. A supplied (32g) needle was used.   Injection: 1.25 mg Bevacizumab 1.'25mg'$ /0.99m   Route: Intravitreal, Site: Right Eye   NDC: [id number] Expiration date: 12/14/2022   Post-op Post injection exam found visual acuity of at least counting fingers. The patient tolerated the procedure well. There were no complications. The patient received written and verbal post procedure care education. Post injection medications were not given.      Intravitreal Injection, Pharmacologic Agent - OS - Left Eye       Time Out 10/27/2022. 2:19 PM. Confirmed correct patient, procedure, site, and patient consented.   Anesthesia Topical anesthesia was used. Anesthetic medications included Lidocaine 2%, Proparacaine 0.5%.   Procedure Preparation included 5% betadine to ocular surface, eyelid speculum. A supplied  (32g) needle was used.   Injection: 1.25 mg Bevacizumab 1.'25mg'$ /0.075m  Route: Intravitreal, Site: Left Eye   NDC: H061816, Lot: [id number]'$ , Expiration date: 11/07/2022   Post-op Post injection exam found visual acuity of at least counting fingers. The patient tolerated the procedure well. There were no complications. The patient received written and verbal post procedure care education. Post injection medications were not given.            ASSESSMENT/PLAN:    ICD-10-CM   1. Proliferative diabetic retinopathy of right eye with macular edema associated with type 2 diabetes mellitus (HCC)  E11.3511 OCT, Retina - OU - Both Eyes    Intravitreal Injection, Pharmacologic Agent - OD - Right Eye    Bevacizumab (AVASTIN) SOLN 1.25 mg    2. Severe nonproliferative diabetic retinopathy of left eye with macular edema associated with type 2 diabetes mellitus (HCC)  16/04/2023 Intravitreal Injection, Pharmacologic Agent - OS - Left Eye    Bevacizumab (AVASTIN) SOLN 1.25 mg    3. Essential hypertension  I10     4. Hypertensive retinopathy of both eyes  H35.033     5. Combined forms of age-related cataract of both eyes  H25.813       1,2. Proliferative diabetic retinopathy OD  - severe NPDR OS  - delayed f/u today -- 8 wks instead of 5-6 due to transportation - delayed to follow up from 10.28.22 to 01.04.23 (9+ wks) - s/p IVA OU #1 (06.06.22), #2 (07.08.22), #3 (08.05.22), #4 (09.02.22), #5 (09.30.22), #6 (10.28.22), #7 (01.04.23), #8 (02.20.33), #9 (04.03.23), #10 (05.15.23), #11 (06.28.23), #12 (08.16.23), #13 (10.10.23), #14 (11.14.23), #15 (01.15.24) **h/o increased IRF/DME OU at 7 wks, noted on 08.16.23**  - s/p PRP OD (06.10.22) - exam shows central edema OU, scattered IRH -- improving - FA (06.06.22) shows OD: focal NVE IN arcades; OS: no NV OS; late leaking MA's OU -- s/p PRP OD 6.10.22 - OCT shows OD: Interval improvement in central IRF, partial PVD; OS: Mild interval improvement  in IRF/cystic changes temporal fovea and macula, patchy ORA at 6 weeks - BCVA -- OD 20/40 (decreased); OS 20/70 (decreased) - recommend IVA OU #16 today, 02.26.24 w/ f/u back to 6 wks - pt wishes to proceed - RBA of procedure discussed, questions answered - IVA informed consent obtained and re-signed (OU), 06.28.23 - see procedure note - f/u 6 weeks, DFE/OCT, possible injection(s)  3,4. Hypertensive retinopathy OU - discussed importance of tight BP control - monitor  5. Mixed Cataract OU - The symptoms of cataract, surgical options, and treatments and risks were discussed with patient. - discussed diagnosis and progression - clear from a retina standpoint to proceed with cataract surgery when both patient and surgeon are ready - pt has surgery scheduled on April 3rd with Dr. 01-14-1973  Ophthalmic Meds Ordered this visit:  Meds ordered this encounter  Medications   Bevacizumab (AVASTIN) SOLN 1.25 mg   Bevacizumab (AVASTIN) SOLN 1.25 mg     Return in about 6 weeks (around 12/08/2022) for f/u PDR OU, DFE, OCT.  There are no Patient Instructions on file for this visit.  This document serves as a record of services personally performed by 17/04/2023, MD, PhD. It was created on their behalf by Gardiner Sleeper, an ophthalmic technician. The creation of this record is the provider's dictation and/or activities during the visit.    Electronically signed by: Orvan Falconer, OA, 10/27/22  4:53 PM  This document serves as a record of services personally performed by 11/09/22, MD, PhD. It was created on their behalf  by San Jetty. Owens Shark, OA an ophthalmic technician. The creation of this record is the provider's dictation and/or activities during the visit.    Electronically signed by: San Jetty. Owens Shark, [place] 02.26.2024 4:53 PM  Gardiner Sleeper, M.D., Ph.D. Diseases & Surgery of the Retina and Vitreous [facility]  I have reviewed the above documentation for  accuracy and completeness, and I agree with the above. Gardiner Sleeper, M.D., Ph.D. 10/27/22 4:53 PM   Abbreviations: M myopia (nearsighted); A astigmatism; H hyperopia (farsighted); P presbyopia; Mrx spectacle prescription;  CTL contact lenses; OD right eye; OS left eye; OU both eyes  XT exotropia; ET esotropia; PEK punctate epithelial keratitis; PEE punctate epithelial erosions; DES dry eye syndrome; MGD meibomian gland dysfunction; ATs artificial tears; PFAT's preservative free artificial tears; [place] nuclear sclerotic cataract; PSC posterior subcapsular cataract; ERM epi-retinal membrane; PVD posterior vitreous detachment; RD retinal detachment; DM diabetes mellitus; DR diabetic retinopathy; NPDR non-proliferative diabetic retinopathy; PDR proliferative diabetic retinopathy; CSME clinically significant macular edema; DME diabetic macular edema; dbh dot blot hemorrhages; CWS cotton wool spot; POAG primary open angle glaucoma; C/D cup-to-disc ratio; HVF humphrey visual field; GVF goldmann visual field; OCT optical coherence tomography; IOP intraocular pressure; BRVO Branch retinal vein occlusion; CRVO central retinal vein occlusion; CRAO central retinal artery occlusion; BRAO branch retinal artery occlusion; RT retinal tear; SB scleral buckle; PPV pars plana vitrectomy; VH Vitreous hemorrhage; PRP panretinal laser photocoagulation; IVK intravitreal kenalog; VMT vitreomacular traction; MH Macular hole;  NVD neovascularization of the disc; NVE neovascularization elsewhere; AREDS age related eye disease study; ARMD age related macular degeneration; POAG primary open angle glaucoma; EBMD epithelial/anterior basement membrane dystrophy; ACIOL anterior chamber intraocular lens; IOL intraocular lens; PCIOL posterior chamber intraocular lens; Phaco/IOL phacoemulsification with intraocular lens placement; [place] photorefractive keratectomy; LASIK laser assisted in situ keratomileusis; HTN hypertension; DM diabetes mellitus;  COPD chronic obstructive pulmonary disease

## 2022-10-27 ENCOUNTER — Encounter (INDEPENDENT_AMBULATORY_CARE_PROVIDER_SITE_OTHER): Payer: Self-pay | Admitting: Ophthalmology

## 2022-10-27 ENCOUNTER — Ambulatory Visit (INDEPENDENT_AMBULATORY_CARE_PROVIDER_SITE_OTHER): Payer: 59 | Admitting: Ophthalmology

## 2022-10-27 DIAGNOSIS — E113511 Type 2 diabetes mellitus with proliferative diabetic retinopathy with macular edema, right eye: Secondary | ICD-10-CM

## 2022-10-27 DIAGNOSIS — I10 Essential (primary) hypertension: Secondary | ICD-10-CM | POA: Diagnosis not present

## 2022-10-27 DIAGNOSIS — S0591XA Unspecified injury of right eye and orbit, initial encounter: Secondary | ICD-10-CM

## 2022-10-27 DIAGNOSIS — E113412 Type 2 diabetes mellitus with severe nonproliferative diabetic retinopathy with macular edema, left eye: Secondary | ICD-10-CM

## 2022-10-27 DIAGNOSIS — H25813 Combined forms of age-related cataract, bilateral: Secondary | ICD-10-CM

## 2022-10-27 DIAGNOSIS — H35033 Hypertensive retinopathy, bilateral: Secondary | ICD-10-CM

## 2022-10-27 MED ORDER — BEVACIZUMAB CHEMO INJECTION 1.25MG/0.05ML SYRINGE FOR KALEIDOSCOPE
1.2500 mg | INTRAVITREAL | Status: AC | PRN
  Administered 2022-10-27: 1.25 mg via INTRAVITREAL

## 2022-11-07 ENCOUNTER — Inpatient Hospital Stay: Payer: 59 | Admitting: Hematology & Oncology

## 2022-11-07 ENCOUNTER — Inpatient Hospital Stay: Payer: 59

## 2022-11-11 ENCOUNTER — Other Ambulatory Visit: Payer: Self-pay

## 2022-11-11 MED ORDER — ENTRESTO 97-103 MG PO TABS: 1.0000 | ORAL_TABLET | Freq: Two times a day (BID) | ORAL | 0 refills | Status: DC

## 2022-11-13 ENCOUNTER — Telehealth (HOSPITAL_BASED_OUTPATIENT_CLINIC_OR_DEPARTMENT_OTHER): Payer: Self-pay

## 2022-11-13 MED ORDER — ASPIRIN 81 MG PO TBEC: 81.0000 mg | DELAYED_RELEASE_TABLET | Freq: Every day | ORAL | 3 refills | Status: DC

## 2022-11-13 NOTE — Telephone Encounter (Signed)
Received fax from [place] requesting refills for Aspirin 81 mg. Rx request sent to pharmacy.

## 2022-11-28 NOTE — Progress Notes (Signed)
[facility] - Clinic Note  12/08/2022    CHIEF COMPLAINT Patient presents for Retina Follow Up  HISTORY OF PRESENT ILLNESS: Earl Calderon is a 72 y.o. male who presents to the clinic today for:   HPI     Retina Follow Up   Patient presents with  Diabetic Retinopathy.  In both eyes.  This started weeks ago.  Duration of 6 weeks.  Since onset it is stable.  I, the attending physician,  performed the HPI with the patient and updated documentation appropriately.        Comments   Patient feels that the vision is the same as it was before. Cataract surgery was cancelled because he didn't have a driver. He is not using any eye drops at this time. His blood sugar was over 100.      Last edited by Rennis Chris, MD on 12/08/2022  5:17 PM.    Pt was unable to have cataract sx due to not having anyone with him afterwards, he has not r/s it yet, no changes to vision  Referring physician: Olivia Canter, MD [address] [place],  [place] [address]  HISTORICAL INFORMATION:   Selected notes from the MEDICAL RECORD NUMBER Referred by Dr. Laruth Bouchard for severe NPDR OU   CURRENT MEDICATIONS: No current outpatient medications on file. (Ophthalmic Drugs)   No current facility-administered medications for this visit. (Ophthalmic Drugs)   Current Outpatient Medications (Other)  Medication Sig   APPLE CIDER VINEGAR PO Take 1 tablet by mouth daily at 6 (six) AM.   aspirin EC (ASPIRIN LOW DOSE) 81 MG tablet Take 1 tablet (81 mg total) by mouth daily. Swallow whole.   carvedilol (COREG) 12.5 MG tablet TAKE ONE TABLET BY MOUTH TWICE DAILY WITH A MEAL   ELDERBERRY PO Take 100 mg by mouth daily.   ferrous sulfate 325 (65 FE) MG tablet Take 1 tablet (325 mg total) by mouth daily with breakfast.   furosemide (LASIX) 40 MG tablet TAKE 1 TABLET BY MOUTH ONCE DAILY   glucose blood (FREESTYLE TEST STRIPS) test strip Use as instructed   HUMALOG KWIKPEN 100 UNIT/ML KwikPen  Inject into the skin. Sliding Scale   hydrALAZINE (APRESOLINE) 25 MG tablet TAKE 1 TABLET BY MOUTH IN THE MORNING, AND TAKE 1 TABLET AT BEDTIME   Insulin Pen Needle (NOVOFINE PEN NEEDLE) 32G X 6 MM MISC USE AS DIRECTED   JARDIANCE 10 MG TABS tablet TAKE ONE TABLET BY MOUTH DAILY BEFORE BREAKFAST   LEVEMIR FLEXTOUCH 100 UNIT/ML FlexPen Inject 8 Units into the skin 2 (two) times daily.   nitroGLYCERIN (NITROSTAT) 0.4 MG SL tablet Place 1 tablet (0.4 mg total) under the tongue every 5 (five) minutes as needed for chest pain.   Probiotic Product (PROBIOTIC MULTI-ENZYME) TABS Take 3 tablets by mouth daily at 6 (six) AM.   rosuvastatin (CRESTOR) 40 MG tablet TAKE 1 TABLET BY MOUTH ONCE DAILY AT 6PM   sacubitril-valsartan (ENTRESTO) 97-103 MG Take 1 tablet by mouth 2 (two) times daily.   spironolactone (ALDACTONE) 25 MG tablet TAKE 1 TABLET BY MOUTH ONCE DAILY   Turmeric 500 MG TABS Take 2 tablets by mouth daily at 6 (six) AM.   No current facility-administered medications for this visit. (Other)   REVIEW OF SYSTEMS: ROS   Positive for: Endocrine, Cardiovascular, Eyes Negative for: Constitutional, Gastrointestinal, Neurological, Skin, Genitourinary, Musculoskeletal, HENT, Respiratory, Psychiatric, Allergic/Imm, Heme/Lymph Last edited by Julieanne Cotton, COT on 12/08/2022  1:15 PM.      ALLERGIES No Known Allergies  PAST MEDICAL HISTORY Past Medical History:  Diagnosis Date   CAD in native artery 01/01/2021   Prior LAD PCI.  Currently no symptoms of ischemia.  Encouraged him to do some light exercising.  Continue aspirin, carvedilol, and rosuvastatin.   Cataract    CKD (chronic kidney disease), stage III 08/24/2020   Coronary artery disease    Diabetes mellitus without complication    on meds   Diabetic retinopathy    Goals of care, counseling/discussion 07/01/2021   Hernia, inguinal    Hypertension    on meds   Hypertensive retinopathy    Lymphadenopathy, cervical 07/01/2021    MGUS (monoclonal gammopathy of unknown significance) 07/01/2021   NSVT (nonsustained ventricular tachycardia) 08/24/2020   Pure hypercholesterolemia 08/05/2021   Resistant hypertension 11/11/2018   Uncontrolled type 2 diabetes mellitus 11/11/2018   Past Surgical History:  Procedure Laterality Date   CORONARY STENT INTERVENTION N/A 11/15/2018   Procedure: CORONARY STENT INTERVENTION;  Surgeon: Corky Crafts, MD;  Location: [facility] INVASIVE CV LAB;  Service: Cardiovascular;  Laterality: N/A;   HEMORROIDECTOMY     RIGHT HEART CATH N/A 07/18/2020   Procedure: RIGHT HEART CATH;  Surgeon: Iran Ouch, MD;  Location: [facility] INVASIVE CV LAB;  Service: Cardiovascular;  Laterality: N/A;   RIGHT/LEFT HEART CATH AND CORONARY ANGIOGRAPHY N/A 11/15/2018   Procedure: RIGHT/LEFT HEART CATH AND CORONARY ANGIOGRAPHY;  Surgeon: Corky Crafts, MD;  Location[facility]AS[facility] CV [facility];  Service: Cardiovascular;  Laterality: N/A;   WISDOM TOOTH EXTRACTION     FAMILY HISTORY Family History  Problem Relation Age of Onset   Hypertension Mother    Diabetes Mellitus II Mother    Arrhythmia Mother    Heart disease Mother    Hypertension Sister    Hypertension Brother    Heart disease Brother    Kidney disease Brother    Hypertension Other    Diabetes Mellitus II Other        All 9 sibblings have HTN   Colon cancer Neg Hx    Colon polyps Neg Hx    Esophageal cancer Neg Hx    Stomach cancer Neg Hx    Rectal cancer Neg Hx    SOCIAL HISTORY Social History   Tobacco Use   Smoking status: Never   Smokeless tobacco: Never  Vaping Use   Vaping Use: Never used  Substance Use Topics   Alcohol use: Not Currently   Drug use: Never       OPHTHALMIC EXAM:  Base Eye Exam     Visual Acuity (Snellen - Linear)       Right Left   Dist Muse 20/40 20/60   Dist ph  NI NI         Tonometry (Tonopen, 1:18 PM)       Right Left   Pressure 14 14         Pupils       Dark Light Shape React APD    Right 3 2 Round Brisk None   Left 3 2 Round Brisk None         Visual Fields       Left Right   Restrictions Partial outer superior temporal deficiency Partial outer superior nasal deficiency         Extraocular Movement       Right Left    Full, Ortho Full, Ortho  Neuro/Psych     Oriented x3: Yes   Mood/Affect: Normal         Dilation     Both eyes: 1.0% Mydriacyl, 2.5% Phenylephrine @ 1:16 PM           Slit Lamp and Fundus Exam     Slit Lamp Exam       Right Left   Lids/Lashes Dermatochalasis - upper lid Dermatochalasis - upper lid   Conjunctiva/Sclera Melanosis Melanosis   Cornea arcus, fine endo pigment arcus   Anterior Chamber deep and clear, no cell or flare deep and clear   Iris Round and dilated, No NVI Round and dilated, No NVI   Lens 2-3+ Nuclear sclerosis, 2-3+ Cortical cataract 2-3+ Nuclear sclerosis, 2-3+ Cortical cataract   Anterior Vitreous Vitreous syneresis Vitreous syneresis         Fundus Exam       Right Left   Disc Pink and Sharp, no NVD, +PPP Pink and Sharp, no NVD, +PPP   C/D Ratio 0.6 0.4   Macula good foveal reflex, central edema -- persistent, scattered MA/DBH - improved, focal exudates temporal macula good foveal reflex, scattered MA/DBH, +cystic changes temporal macula -- slightly improved, +exudates and CWS -- improved   Vessels attenuated, tortuous, focal NV IN arcades--regressing, mild copper wiring attenuated, Tortuous   Periphery Attached, 360 MA/DBH greatest posteriorly, light 360 PRP changes with room for fill in if needed Attached, 360 DBH           IMAGING AND PROCEDURES  Imaging and Procedures for 12/08/2022  OCT, Retina - OU - Both Eyes       Right Eye Quality was good. Central Foveal Thickness: 248. Progression has improved. Findings include no SRF, abnormal foveal contour, intraretinal hyper-reflective material, intraretinal fluid, vitreomacular adhesion (Persistent IRF greatest nasal and  central macula, partial PVD).   Left Eye Quality was good. Central Foveal Thickness: 204. Progression has improved. Findings include normal foveal contour, no SRF, intraretinal hyper-reflective material, intraretinal fluid, vitreomacular adhesion (persistent IRF/cystic changes temporal fovea and macula, patchy ORA).   Notes *Images captured and stored on drive  Diagnosis / Impression:  +DME OU OD: Persistent IRF greatest nasal and central macula--slightly improved, partial PVD OS: persistent IRF/cystic changes temporal fovea and macula--slightly improved, patchy ORA  Clinical management:  See below  Abbreviations: NFP - Normal foveal profile. CME - cystoid macular edema. PED - pigment epithelial detachment. IRF - intraretinal fluid. SRF - subretinal fluid. EZ - ellipsoid zone. ERM - epiretinal membrane. ORA - outer retinal atrophy. ORT - outer retinal tubulation. SRHM - subretinal hyper-reflective material. IRHM - intraretinal hyper-reflective material      Intravitreal Injection, Pharmacologic Agent - OD - Right Eye       Time Out 12/08/2022. 1:51 PM. Confirmed correct patient, procedure, site, and patient consented.   Anesthesia Topical anesthesia was used. Anesthetic medications included Lidocaine 2%, Proparacaine 0.5%.   Procedure Preparation included 5% betadine to ocular surface, eyelid speculum. A supplied (32g) needle was used.   Injection: 1.25 mg Bevacizumab 1.25mg /0.62ml   Route: Intravitreal, Site: Right Eye   NDC: P3213405, Lot: [id number], Expiration date: 01/18/2023   Post-op Post injection exam found visual acuity of at least counting fingers. The patient tolerated the procedure well. There were no complications. The patient received written and verbal post procedure care education. Post injection medications were not given.      Intravitreal Injection, Pharmacologic Agent - OS - Left Eye       Time  Out 12/08/2022. 1:51 PM. Confirmed correct patient,  procedure, site, and patient consented.   Anesthesia Topical anesthesia was used. Anesthetic medications included Lidocaine 2%, Proparacaine 0.5%.   Procedure Preparation included 5% betadine to ocular surface, eyelid speculum. A (32g) needle was used.   Injection: 1.25 mg Bevacizumab 1.25mg /0.74ml   Route: Intravitreal, Site: Left Eye   NDC: P3213405, Lot: [id number], Expiration date: 03/05/2023   Post-op Post injection exam found visual acuity of at least counting fingers. The patient tolerated the procedure well. There were no complications. The patient received written and verbal post procedure care education. Post injection medications were not given.             ASSESSMENT/PLAN:    ICD-10-CM   1. Proliferative diabetic retinopathy of right eye with macular edema associated with type 2 diabetes mellitus  E11.3511 OCT, Retina - OU - Both Eyes    Intravitreal Injection, Pharmacologic Agent - OD - Right Eye    Bevacizumab (AVASTIN) SOLN 1.25 mg    2. Severe nonproliferative diabetic retinopathy of left eye with macular edema associated with type 2 diabetes mellitus  E11.3412 Intravitreal Injection, Pharmacologic Agent - OS - Left Eye    Bevacizumab (AVASTIN) SOLN 1.25 mg    3. Essential hypertension  I10     4. Hypertensive retinopathy of both eyes  H35.033     5. Combined forms of age-related cataract of both eyes  H25.813     6. Ocular trauma of right eye, initial encounter  S05.91XA      1,2. Proliferative diabetic retinopathy OD  - severe NPDR OS - delayed to follow up from 10.28.22 to 01.04.23 (9+ wks) - s/p IVA OU #1 (06.06.22), #2 (07.08.22), #3 (08.05.22), #4 (09.02.22), #5 (09.30.22), #6 (10.28.22), #7 (01.04.23), #8 (02.20.33), #9 (04.03.23), #10 (05.15.23), #11 (06.28.23), #12 (08.16.23), #13 (10.10.23), #14 (11.14.23), #15 (01.15.24), #16 (02.26.24)  **h/o increased IRF/DME OU at 7 wks, noted on 08.16.23**  - s/p PRP OD (06.10.22) - exam shows  central edema OU, scattered IRH -- improving - FA (06.06.22) shows OD: focal NVE IN arcades; OS: no NV OS; late leaking MA's OU -- s/p PRP OD 6.10.22 - OCT shows OD: Persistent IRF greatest nasal and central macula--slightly improved, partial PVD; OS: persistent IRF/cystic changes temporal fovea and macula--slightly improved, patchy ORA at 6 weeks - BCVA -- OD 20/40 (stable); OS 20/60 from 20/70 - recommend IVA OU #17 today, 04.03.24 w/ f/u in 6 wks - pt wishes to proceed - RBA of procedure discussed, questions answered - IVA informed consent obtained and re-signed (OU), 06.28.23 - see procedure note - f/u 6 weeks, DFE/OCT, possible injection(s)  3,4. Hypertensive retinopathy OU - discussed importance of tight BP control - monitor  5. Mixed Cataract OU - The symptoms of cataract, surgical options, and treatments and risks were discussed with patient. - discussed diagnosis and progression - clear from a retina standpoint to proceed with cataract surgery when both patient and surgeon are ready - pt had surgery scheduled on April 3rd with Dr. Zenaida Niece -- surgery was canceled due to pt not having anyone to drive him, pt has not rescheduled appt yet  Ophthalmic Meds Ordered this visit:  Meds ordered this encounter  Medications   Bevacizumab (AVASTIN) SOLN 1.25 mg   Bevacizumab (AVASTIN) SOLN 1.25 mg     Return in about 6 weeks (around 01/19/2023) for PDR OU, DFE, OCT.  There are no Patient Instructions on file for this visit.  This document serves as a  record of services personally performed by Karie Chimera, MD, PhD. It was created on their behalf by De Blanch, an ophthalmic technician. The creation of this record is the provider's dictation and/or activities during the visit.    Electronically signed by: De Blanch, OA, 12/08/22  5:17 PM  This document serves as a record of services personally performed by Karie Chimera, MD, PhD. It was created on their behalf by Glee Arvin.  Manson Passey, OA an ophthalmic technician. The creation of this record is the provider's dictation and/or activities during the visit.    Electronically signed by: Glee Arvin. Manson Passey, [place] 04.08.2024 5:17 PM  Karie Chimera, M.D., Ph.D. Diseases & Surgery of the Retina and Vitreous [facility]  I have reviewed the above documentation for accuracy and completeness, and I agree with the above. Karie Chimera, M.D., Ph.D. 12/08/22 5:19 PM   Abbreviations: M myopia (nearsighted); A astigmatism; H hyperopia (farsighted); P presbyopia; Mrx spectacle prescription;  CTL contact lenses; OD right eye; OS left eye; OU both eyes  XT exotropia; ET esotropia; PEK punctate epithelial keratitis; PEE punctate epithelial erosions; DES dry eye syndrome; MGD meibomian gland dysfunction; ATs artificial tears; PFAT's preservative free artificial tears; NSC nuclear sclerotic cataract; PSC posterior subcapsular cataract; ERM epi-retinal membrane; PVD posterior vitreous detachment; RD retinal detachment; DM diabetes mellitus; DR diabetic retinopathy; NPDR non-proliferative diabetic retinopathy; PDR proliferative diabetic retinopathy; CSME clinically significant macular edema; DME diabetic macular edema; dbh dot blot hemorrhages; CWS cotton wool spot; POAG primary open angle glaucoma; C/D cup-to-disc ratio; HVF humphrey visual field; GVF goldmann visual field; OCT optical coherence tomography; IOP intraocular pressure; BRVO Branch retinal vein occlusion; CRVO central retinal vein occlusion; CRAO central retinal artery occlusion; BRAO branch retinal artery occlusion; RT retinal tear; SB scleral buckle; PPV pars plana vitrectomy; VH Vitreous hemorrhage; PRP panretinal laser photocoagulation; IVK intravitreal kenalog; VMT vitreomacular traction; MH Macular hole;  NVD neovascularization of the disc; NVE neovascularization elsewhere; AREDS age related eye disease study; ARMD age related macular degeneration; POAG primary  open angle glaucoma; EBMD epithelial/anterior basement membrane dystrophy; ACIOL anterior chamber intraocular lens; IOL intraocular lens; PCIOL posterior chamber intraocular lens; Phaco/IOL phacoemulsification with intraocular lens placement; PRK photorefractive keratectomy; LASIK laser assisted in situ keratomileusis; HTN hypertension; DM diabetes mellitus; COPD chronic obstructive pulmonary disease

## 2022-12-01 ENCOUNTER — Inpatient Hospital Stay: Payer: 59

## 2022-12-01 ENCOUNTER — Inpatient Hospital Stay: Payer: 59 | Admitting: Hematology & Oncology

## 2022-12-08 ENCOUNTER — Ambulatory Visit (INDEPENDENT_AMBULATORY_CARE_PROVIDER_SITE_OTHER): Payer: 59 | Admitting: Ophthalmology

## 2022-12-08 ENCOUNTER — Encounter (INDEPENDENT_AMBULATORY_CARE_PROVIDER_SITE_OTHER): Payer: Self-pay | Admitting: Ophthalmology

## 2022-12-08 DIAGNOSIS — H25813 Combined forms of age-related cataract, bilateral: Secondary | ICD-10-CM | POA: Diagnosis not present

## 2022-12-08 DIAGNOSIS — I10 Essential (primary) hypertension: Secondary | ICD-10-CM

## 2022-12-08 DIAGNOSIS — S0591XA Unspecified injury of right eye and orbit, initial encounter: Secondary | ICD-10-CM

## 2022-12-08 DIAGNOSIS — H35033 Hypertensive retinopathy, bilateral: Secondary | ICD-10-CM

## 2022-12-08 DIAGNOSIS — E113511 Type 2 diabetes mellitus with proliferative diabetic retinopathy with macular edema, right eye: Secondary | ICD-10-CM | POA: Diagnosis not present

## 2022-12-08 DIAGNOSIS — E113412 Type 2 diabetes mellitus with severe nonproliferative diabetic retinopathy with macular edema, left eye: Secondary | ICD-10-CM

## 2022-12-08 MED ORDER — BEVACIZUMAB CHEMO INJECTION 1.25MG/0.05ML SYRINGE FOR KALEIDOSCOPE
1.2500 mg | INTRAVITREAL | Status: AC | PRN
  Administered 2022-12-08: 1.25 mg via INTRAVITREAL

## 2022-12-08 MED ORDER — BEVACIZUMAB CHEMO INJECTION 1.25MG/0.05ML SYRINGE FOR KALEIDOSCOPE
1.2500 mg | INTRAVITREAL | Status: AC | PRN
Start: 2022-12-08 — End: 2022-12-08
  Administered 2022-12-08: 1.25 mg via INTRAVITREAL

## 2022-12-12 ENCOUNTER — Other Ambulatory Visit: Payer: Self-pay | Admitting: Cardiovascular Disease

## 2022-12-12 NOTE — Telephone Encounter (Signed)
Rx request sent to pharmacy.  

## 2022-12-25 ENCOUNTER — Inpatient Hospital Stay: Payer: 59 | Attending: Hematology & Oncology

## 2022-12-25 ENCOUNTER — Other Ambulatory Visit (HOSPITAL_BASED_OUTPATIENT_CLINIC_OR_DEPARTMENT_OTHER): Payer: Self-pay | Admitting: Cardiovascular Disease

## 2022-12-25 ENCOUNTER — Inpatient Hospital Stay (HOSPITAL_BASED_OUTPATIENT_CLINIC_OR_DEPARTMENT_OTHER): Payer: 59 | Admitting: Hematology & Oncology

## 2022-12-25 ENCOUNTER — Encounter: Payer: Self-pay | Admitting: Hematology & Oncology

## 2022-12-25 VITALS — BP 150/69 | HR 58 | Temp 98.2°F | Resp 18 | Ht 69.5 in | Wt 146.8 lb

## 2022-12-25 DIAGNOSIS — E119 Type 2 diabetes mellitus without complications: Secondary | ICD-10-CM | POA: Insufficient documentation

## 2022-12-25 DIAGNOSIS — R350 Frequency of micturition: Secondary | ICD-10-CM | POA: Diagnosis not present

## 2022-12-25 DIAGNOSIS — Z7982 Long term (current) use of aspirin: Secondary | ICD-10-CM | POA: Diagnosis not present

## 2022-12-25 DIAGNOSIS — Z794 Long term (current) use of insulin: Secondary | ICD-10-CM | POA: Insufficient documentation

## 2022-12-25 DIAGNOSIS — D472 Monoclonal gammopathy: Secondary | ICD-10-CM | POA: Insufficient documentation

## 2022-12-25 DIAGNOSIS — Z79899 Other long term (current) drug therapy: Secondary | ICD-10-CM | POA: Insufficient documentation

## 2022-12-25 LAB — CBC WITH DIFFERENTIAL (CANCER CENTER ONLY)
Abs Immature Granulocytes: 0.03 10*3/uL (ref 0.00–0.07)
Basophils Absolute: 0 10*3/uL (ref 0.0–0.1)
Basophils Relative: 0 %
Eosinophils Absolute: 0.1 10*3/uL (ref 0.0–0.5)
Eosinophils Relative: 2 %
HCT: 33.9 % — ABNORMAL LOW (ref 39.0–52.0)
Hemoglobin: 11.6 g/dL — ABNORMAL LOW (ref 13.0–17.0)
Immature Granulocytes: 0 %
Lymphocytes Relative: 13 %
Lymphs Abs: 1.1 10*3/uL (ref 0.7–4.0)
MCH: 31.8 pg (ref 26.0–34.0)
MCHC: 34.2 g/dL (ref 30.0–36.0)
MCV: 92.9 fL (ref 80.0–100.0)
Monocytes Absolute: 0.4 10*3/uL (ref 0.1–1.0)
Monocytes Relative: 5 %
Neutro Abs: 6.6 10*3/uL (ref 1.7–7.7)
Neutrophils Relative %: 80 %
Platelet Count: 156 10*3/uL (ref 150–400)
RBC: 3.65 MIL/uL — ABNORMAL LOW (ref 4.22–5.81)
RDW: 12.2 % (ref 11.5–15.5)
WBC Count: 8.3 10*3/uL (ref 4.0–10.5)
nRBC: 0 % (ref 0.0–0.2)

## 2022-12-25 LAB — CMP (CANCER CENTER ONLY)
ALT: 25 U/L (ref 0–44)
AST: 21 U/L (ref 15–41)
Albumin: 3.8 g/dL (ref 3.5–5.0)
Alkaline Phosphatase: 89 U/L (ref 38–126)
Anion gap: 6 (ref 5–15)
BUN: 25 mg/dL — ABNORMAL HIGH (ref 8–23)
CO2: 31 mmol/L (ref 22–32)
Calcium: 8.8 mg/dL — ABNORMAL LOW (ref 8.9–10.3)
Chloride: 100 mmol/L (ref 98–111)
Creatinine: 1.28 mg/dL — ABNORMAL HIGH (ref 0.61–1.24)
GFR, Estimated: >60 mL/min (ref >60)
Glucose, Bld: 274 mg/dL — ABNORMAL HIGH (ref 70–99)
Potassium: 4 mmol/L (ref 3.5–5.1)
Sodium: 137 mmol/L (ref 135–145)
Total Bilirubin: 0.9 mg/dL (ref 0.3–1.2)
Total Protein: 7.7 g/dL (ref 6.5–8.1)

## 2022-12-25 LAB — LACTATE DEHYDROGENASE: LDH: 156 U/L (ref 98–192)

## 2022-12-25 NOTE — Progress Notes (Signed)
Hematology and Oncology Follow Up Visit  Earl Calderon [id number] [birth year] 71 y.o. 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  We last saw him back in November.  Since then, he has been doing pretty well.  Unfortunate, his blood sugars are not doing all that well.  He really does not have a family doctor.  He really needs to have a family doctor.  His blood sugar today was 274.  He does have urinary frequency.  Again I am sure this is probably from his diabetes.  When I saw him, his monoclonal spike was stable at 1.6 g/dL.  His IgG level was 2510 mg/dL.  His Kappa light chain was 13 mg/dL.  He has had no problem with fever.  He has had no issues with COVID.  He has had no diarrhea.  There is been no cough.  He has had no shortness of breath.  He does get tired little bit more easily.  He has had no leg swelling.  Overall, I was his performance status is probably ECOG 1.     Medications:  Current Outpatient Medications:    APPLE CIDER VINEGAR PO, Take 1 tablet by mouth daily at 6 (six) AM., Disp: , Rfl:    aspirin EC (ASPIRIN LOW DOSE) 81 MG tablet, Take 1 tablet (81 mg total) by mouth daily. Swallow whole., Disp: 90 tablet, Rfl: 3   carvedilol (COREG) 12.5 MG tablet, TAKE ONE TABLET BY MOUTH TWICE DAILY WITH A MEAL, Disp: 180 tablet, Rfl: 1   ELDERBERRY PO, Take 100 mg by mouth daily., Disp: , Rfl:    ferrous sulfate 325 (65 FE) MG tablet, Take 1 tablet (325 mg total) by mouth daily with breakfast., Disp: 90 tablet, Rfl: 3   furosemide (LASIX) 40 MG tablet, TAKE 1 TABLET BY MOUTH ONCE DAILY, Disp: 90 tablet, Rfl: 3   glucose blood (FREESTYLE TEST STRIPS) test strip, Use as instructed, Disp: 100 each, Rfl: 12   HUMALOG KWIKPEN 100 UNIT/ML KwikPen, Inject into the skin. Sliding Scale, Disp: , Rfl:    hydrALAZINE (APRESOLINE) 25 MG tablet, TAKE 1  TABLET BY MOUTH IN THE MORNING, AND TAKE 1 TABLET AT BEDTIME, Disp: 180 tablet, Rfl: 2   Insulin Pen Needle (NOVOFINE PEN NEEDLE) 32G X 6 MM MISC, USE AS DIRECTED, Disp: 100 each, Rfl: 6   JARDIANCE 10 MG TABS tablet, TAKE ONE TABLET BY MOUTH DAILY BEFORE BREAKFAST, Disp: 90 tablet, Rfl: 2   LEVEMIR FLEXTOUCH 100 UNIT/ML FlexPen, Inject 8 Units into the skin 2 (two) times daily., Disp: 15 mL, Rfl: 1   Probiotic Product (PROBIOTIC MULTI-ENZYME) TABS, Take 3 tablets by mouth daily at 6 (six) AM., Disp: , Rfl:    rosuvastatin (CRESTOR) 40 MG tablet, TAKE 1 TABLET BY MOUTH ONCE DAILY AT 6PM, Disp: 90 tablet, Rfl: 2   sacubitril-valsartan (ENTRESTO) 97-103 MG, TAKE ONE TABLET BY MOUTH TWO TIMES DAILY, Disp: 180 tablet, Rfl: 1   spironolactone (ALDACTONE) 25 MG tablet, TAKE 1 TABLET BY MOUTH ONCE DAILY, Disp: 90 tablet, Rfl: 2   Turmeric 500 MG TABS, Take 2 tablets by mouth daily at 6 (six) AM., Disp: , Rfl:    nitroGLYCERIN (NITROSTAT) 0.4 MG SL tablet, Place 1 tablet (0.4 mg total) under the tongue every 5 (five) minutes as needed for chest pain., Disp: 25 tablet, Rfl: 3  Allergies: No  Known Allergies  Past Medical History, Surgical history, Social history, and Family History were reviewed and updated.  Review of Systems: Review of Systems  Constitutional:  Positive for fatigue.  HENT:  Negative.    Eyes: Negative.   Respiratory: Negative.    Cardiovascular: Negative.   Gastrointestinal: Negative.   Endocrine: Negative.   Genitourinary: Negative.    Musculoskeletal: Negative.   Skin: Negative.   Neurological: Negative.   Hematological: Negative.   Psychiatric/Behavioral: Negative.      Physical Exam:  height is 5' 9.5" (1.765 m) and weight is 146 lb 12 oz (66.6 kg). His oral temperature is 98.2 F (36.8 C). His blood pressure is 150/69 (abnormal) and his pulse is 58 (abnormal). His respiration is 18 and oxygen saturation is 100%.   Wt Readings from Last 3 Encounters:  12/25/22 146 lb  12 oz (66.6 kg)  08/07/22 155 lb 4.8 oz (70.4 kg)  07/23/22 151 lb (68.5 kg)    Physical Exam Vitals reviewed.  HENT:     Head: Normocephalic and atraumatic.  Eyes:     Pupils: Pupils are equal, round, and reactive to light.  Cardiovascular:     Rate and Rhythm: Normal rate and regular rhythm.     Heart sounds: Normal heart sounds.  Pulmonary:     Effort: Pulmonary effort is normal.     Breath sounds: Normal breath sounds.  Abdominal:     General: Bowel sounds are normal.     Palpations: Abdomen is soft.  Musculoskeletal:        General: No tenderness or deformity. Normal range of motion.     Cervical back: Normal range of motion.  Lymphadenopathy:     Cervical: No cervical adenopathy.  Skin:    General: Skin is warm and dry.     Findings: No erythema or rash.  Neurological:     Mental Status: He is alert and oriented to person, place, and time.  Psychiatric:        Behavior: Behavior normal.        Thought Content: Thought content normal.        Judgment: Judgment normal.      Lab Results  Component Value Date   WBC 8.3 12/25/2022   HGB 11.6 (L) 12/25/2022   HCT 33.9 (L) 12/25/2022   MCV 92.9 12/25/2022   PLT 156 12/25/2022     Chemistry      Component Value Date/Time   NA 137 12/25/2022 1001   NA 148 (H) 11/25/2021 1521   K 4.0 12/25/2022 1001   CL 100 12/25/2022 1001   CO2 31 12/25/2022 1001   BUN 25 (H) 12/25/2022 1001   BUN 19 11/25/2021 1521   CREATININE 1.28 (H) 12/25/2022 1001      Component Value Date/Time   CALCIUM 8.8 (L) 12/25/2022 1001   ALKPHOS 89 12/25/2022 1001   AST 21 12/25/2022 1001   ALT 25 12/25/2022 1001   BILITOT 0.9 12/25/2022 1001       Impression and Plan: Earl Calderon is a very nice 72 year old African-American male.  He has what I would consider smoldering myeloma.  He has an IgG kappa spike.  To me, I still think that his diabetes can be a much bigger problem than myeloma will be.  Hopefully, he will be able to get  the blood sugar under control.  We will have to follow him up.  We will have to get him back in about 2 or 3 months for follow-up.  Hopefully, his blood sugars would not cause problems for him that we will end him up in the hospital.    Josph Macho, MD 4/25/202411:09 AM

## 2022-12-25 NOTE — Telephone Encounter (Signed)
Rx request sent to pharmacy.  

## 2022-12-26 LAB — KAPPA/LAMBDA LIGHT CHAINS
Kappa free light chain: 114.4 mg/L — ABNORMAL HIGH (ref 3.3–19.4)
Kappa, lambda light chain ratio: 4.19 — ABNORMAL HIGH (ref 0.26–1.65)
Lambda free light chains: 27.3 mg/L — ABNORMAL HIGH (ref 5.7–26.3)

## 2022-12-26 LAB — IGG, IGA, IGM
IgA: 211 mg/dL (ref 61–437)
IgG (Immunoglobin G), Serum: 2078 mg/dL — ABNORMAL HIGH (ref 603–1613)
IgM (Immunoglobulin M), Srm: 58 mg/dL (ref 20–172)

## 2022-12-29 ENCOUNTER — Telehealth (HOSPITAL_BASED_OUTPATIENT_CLINIC_OR_DEPARTMENT_OTHER): Payer: Self-pay | Admitting: *Deleted

## 2022-12-29 ENCOUNTER — Encounter (HOSPITAL_BASED_OUTPATIENT_CLINIC_OR_DEPARTMENT_OTHER): Payer: Self-pay | Admitting: Cardiovascular Disease

## 2022-12-29 ENCOUNTER — Ambulatory Visit (INDEPENDENT_AMBULATORY_CARE_PROVIDER_SITE_OTHER): Payer: 59 | Admitting: Cardiovascular Disease

## 2022-12-29 VITALS — BP 123/70 | HR 52 | Ht 69.5 in | Wt 145.7 lb

## 2022-12-29 DIAGNOSIS — I5042 Chronic combined systolic (congestive) and diastolic (congestive) heart failure: Secondary | ICD-10-CM | POA: Diagnosis not present

## 2022-12-29 DIAGNOSIS — I2510 Atherosclerotic heart disease of native coronary artery without angina pectoris: Secondary | ICD-10-CM | POA: Diagnosis not present

## 2022-12-29 MED ORDER — CARVEDILOL 6.25 MG PO TABS
6.2500 mg | ORAL_TABLET | Freq: Two times a day (BID) | ORAL | 3 refills | Status: DC
Start: 2022-12-29 — End: 2023-09-03

## 2022-12-29 NOTE — Assessment & Plan Note (Signed)
Status post PCI.  He has no ischemic symptoms.  Continue aspirin.  Reduce carvedilol as above.  Continue rosuvastatin.  He will come back for fasting lipids and a CMP when able.  He struggles with transportation.

## 2022-12-29 NOTE — Assessment & Plan Note (Signed)
Continue rosuvastatin.  Check fasting lipids and CMP as above. ?

## 2022-12-29 NOTE — Progress Notes (Signed)
Cardiology Office Note  Date:  12/29/2022   ID:  Earl Calderon, DOB 1950-12-18, MRN [id number]  PCP:  Melvenia Beam, MD  Cardiologist:   Chilton Si, MD   No chief complaint on file.    History of Present Illness: Earl Calderon is a 72 yo male with chronic systolic and diastolic heart failure, prior stroke, diabetes, hypertension, hyperlipidemia, CAD status post PCI and nonadherence admitted 07/2020 with acute on chronic systolic and diastolic heart failure in the setting of not taking his medications. He was admitted with volume overload and dyspnea.  He wanted to "remove all chemicals from his body" and decided to stop his medications. Echo revealed LVEF 30-35% with global hypokinesis and moderate LVH. BP was poorly controlled. There was concern for cardiac amyloidosis.  PYP scan during that hospitalization was not suggestive of ATTR amyloidosis.  He was started on carvedilol, Entresto, spironolactone, BiDil, and for [place].  Prior to discharge he had a right heart cath with radial pressure of 8, RV 53/3, pulmonary capillary wedge 15.  Mean PA pressure was 29 mmHg.  After discharge he was unable to get spironolactone or BiDil from the pharmacy.  [name] was first seen by cardiology 10/2018 at which time he was hospitalized and found to have a newly reduced LVEF to 25 to 30%.  EKG revealed left bundle branch block.  He underwent right and left heart cath 10/2018 and was found to have a 90% mid LAD lesion that was successfully stented with drug-eluting stent.  He did not follow-up after that time.  He was seen 07/2020 and he was not taking spirolactone or vidl, however they were not started because his blood pressure was low and he was fatigued. His Lasix was decreased given he was on farxiga. He followed up with [name] and Imdur was added. He then felt fatigued and weak and was seen on 10/2020 for chest pain.  At his initial appointment he was doing well from a cardiac  standpoint. He reported some DOE that did not concern him. He was working with Dr. Dione Booze for diabetic retinopathy. At his last appointment, he was fatigued and mildly short of breath consistently and his eyesight was mostly unchanged. He reported slight improvement with the injection treatments. His blood pressure was low and he was not taking carvedilol, the dose was reduced to 12.5mg . He saw Dr. [name] on 06/2021 and his blood pressure was very elevated, though he had not taken medication that morning. He was referred for a R supraclavicular mass. He is being worked up for a monoclonal spike. He had a CT scan that showed the mass was a lipoma.   Blood pressures remained uncontrolled so hydralazine was increased. He had a bone marrow biopsy 12/2021 for MGUS and a positive M spike on SPEP.  He followed up with oncology 07/2022 where he was diagnosed with IgG Kappa Smoldering myeloma, he has not required any treatment.  At his visit 08/2022 he complained of frequent illness and feeling fatigued. His blood pressure was very elevated in clinic in the setting of not taking his antihypertensives yet. He had reduced his carvedilol to one tablet instead of two for the prior week. He reported better controlled blood pressures at home. It was recommended he restart taking his medications as prescribed.  Today, he complains of constantly feeling tired over the past few months since his last appointment in December. Lately he has been sleeping better than he did previously. When he first wakes  up in the morning he takes his time getting out of bed. If he were to jump up quickly he would feel a little dizzy. He is staying active with yard work. Mostly he will feel tired and weak, but no chest pain or shortness of breath. Also he states that his blood sugars have been higher on average. He will be monitoring this at home more closely. He takes 8 units of Levemir prior to eating a meal. He denies any palpitations,  peripheral edema, headaches, syncope, orthopnea, or PND.  Past Medical History:  Diagnosis Date   CAD in native artery 01/01/2021   Prior LAD PCI.  Currently no symptoms of ischemia.  Encouraged him to do some light exercising.  Continue aspirin, carvedilol, and rosuvastatin.   Cataract    CKD (chronic kidney disease), stage III (HCC) 08/24/2020   Coronary artery disease    Diabetes mellitus without complication (HCC)    on meds   Diabetic retinopathy (HCC)    Goals of care, counseling/discussion 07/01/2021   Hernia, inguinal    Hypertension    on meds   Hypertensive retinopathy    Lymphadenopathy, cervical 07/01/2021   MGUS (monoclonal gammopathy of unknown significance) 07/01/2021   NSVT (nonsustained ventricular tachycardia) (HCC) 08/24/2020   Pure hypercholesterolemia 08/05/2021   Resistant hypertension 11/11/2018   Uncontrolled type 2 diabetes mellitus 11/11/2018    Past Surgical History:  Procedure Laterality Date   CORONARY STENT INTERVENTION N/A 11/15/2018   Procedure: CORONARY STENT INTERVENTION;  Surgeon: Corky Crafts, MD;  Location: [facility] INVASIVE CV LAB;  Service: Cardiovascular;  Laterality: N/A;   HEMORROIDECTOMY     RIGHT HEART CATH N/A 07/18/2020   Procedure: RIGHT HEART CATH;  Surgeon: Iran Ouch, MD;  Location: [facility] INVASIVE CV LAB;  Service: Cardiovascular;  Laterality: N/A;   RIGHT/LEFT HEART CATH AND CORONARY ANGIOGRAPHY N/A 11/15/2018   Procedure: RIGHT/LEFT HEART CATH AND CORONARY ANGIOGRAPHY;  Surgeon: Corky Crafts, MD;  Location: [facility] INVASIVE CV LAB;  Service: Cardiovascular;  Laterality: N/A;   WISDOM TOOTH EXTRACTION       Current Outpatient Medications  Medication Sig Dispense Refill   APPLE CIDER VINEGAR PO Take 1 tablet by mouth daily at 6 (six) AM.     aspirin EC (ASPIRIN LOW DOSE) 81 MG tablet Take 1 tablet (81 mg total) by mouth daily. Swallow whole. 90 tablet 3   ELDERBERRY PO Take 100 mg by mouth daily.     ferrous sulfate  325 (65 FE) MG tablet Take 1 tablet (325 mg total) by mouth daily with breakfast. 90 tablet 3   furosemide (LASIX) 40 MG tablet TAKE 1 TABLET BY MOUTH ONCE DAILY 90 tablet 3   glucose blood (FREESTYLE TEST STRIPS) test strip Use as instructed 100 each 12   HUMALOG KWIKPEN 100 UNIT/ML KwikPen Inject into the skin. Sliding Scale     hydrALAZINE (APRESOLINE) 25 MG tablet TAKE 1 TABLET BY MOUTH IN THE MORNING, AND TAKE 1 TABLET AT BEDTIME 180 tablet 1   Insulin Pen Needle (NOVOFINE PEN NEEDLE) 32G X 6 MM MISC USE AS DIRECTED 100 each 6   JARDIANCE 10 MG TABS tablet TAKE ONE TABLET BY MOUTH DAILY BEFORE BREAKFAST 90 tablet 2   LEVEMIR FLEXTOUCH 100 UNIT/ML FlexPen Inject 8 Units into the skin 2 (two) times daily. 15 mL 1   Probiotic Product (PROBIOTIC MULTI-ENZYME) TABS Take 3 tablets by mouth daily at 6 (six) AM.     rosuvastatin (CRESTOR) 40 MG tablet TAKE 1  TABLET BY MOUTH ONCE DAILY AT 6PM 90 tablet 2   sacubitril-valsartan (ENTRESTO) 97-103 MG TAKE ONE TABLET BY MOUTH TWO TIMES DAILY 180 tablet 1   spironolactone (ALDACTONE) 25 MG tablet TAKE 1 TABLET BY MOUTH ONCE DAILY 90 tablet 2   Turmeric 500 MG TABS Take 2 tablets by mouth daily at 6 (six) AM.     carvedilol (COREG) 6.25 MG tablet Take 1 tablet (6.25 mg total) by mouth 2 (two) times daily with a meal. 180 tablet 3   nitroGLYCERIN (NITROSTAT) 0.4 MG SL tablet Place 1 tablet (0.4 mg total) under the tongue every 5 (five) minutes as needed for chest pain. 25 tablet 3   No current facility-administered medications for this visit.    Allergies:   Patient has no known allergies.    Social History:  The patient  reports that he has never smoked. He has never used smokeless tobacco. He reports that he does not currently use alcohol. He reports that he does not use drugs.   Family History:  The patient's family history includes Arrhythmia in his mother; Diabetes Mellitus II in his mother and another family member; Heart disease in his brother  and mother; Hypertension in his brother, mother, sister, and another family member; Kidney disease in his brother.    ROS:   Please see the history of present illness. (+) Fatigue/Malaise (+) Generalized weakness All other systems are reviewed and negative.    PHYSICAL EXAM: VS:  BP 123/70 (BP Location: Left Arm, Patient Position: Sitting, Cuff Size: Normal)   Pulse (!) 52   Ht 5' 9.5" (1.765 m)   Wt 145 lb 11.2 oz (66.1 kg)   PF 100 L/min   BMI 21.21 kg/m  , BMI Body mass index is 21.21 kg/m. GENERAL:  Well appearing.  No acute distress. HEENT:  Pupils equal round and reactive, fundi not visualized, oral mucosa unremarkable NECK:  No jugular venous distention, waveform within normal limits, carotid upstroke brisk and symmetric, no bruits LUNGS:  Clear to auscultation bilaterally HEART:  Bradycardic.  PMI not displaced or sustained,S1 and S2 within normal limits, no S3, no S4, no clicks, no rubs, no murmurs ABD:  Flat, positive bowel sounds normal in frequency in pitch, no bruits, no rebound, no guarding, no midline pulsatile mass, no hepatomegaly, no splenomegaly EXT:  2 plus pulses throughout, no edema, no cyanosis no clubbing SKIN:  No rashes no nodules NEURO:  Cranial nerves II through XII grossly intact, motor grossly intact throughout PSYCH:  Cognitively intact, oriented to person place and time  EKG:   The EKG is personally reviewed. 12/29/2022:  Sinus bradycardia. Rate 53 bpm. LBBB. 08/07/2022: not ordered. 02/03/2022:  Sinus bradycardia. Rate 53 bpm. LBBB. 05/03/2021: Sinus bradycardia. Rate 59 bpm. LBBB.   Chest CT 07/19/21 1. No evidence for lymphadenopathy in the chest, abdomen, or pelvis. 2. 1.9 cm lesion in the lateral segment left liver shows peripheral enhancement and filling in delayed imaging. Imaging characteristics suggest cavernous hemangioma but are not entirely characteristic. MRI abdomen without and with contrast recommended to further evaluate. 3. 12 mm  hypervascular lesion in the spleen cannot be definitively characterized but is likely benign and may be a hemangioma. Attention on follow-up recommended. 4. 2 mm right middle lobe and 4 mm perifissural right lower lobe pulmonary nodules. No follow-up needed if patient is low-risk (and has no known or suspected primary neoplasm). Non-contrast chest CT can be considered in 12 months if patient is high-risk. This recommendation follows  the consensus statement: Guidelines for Management of Incidental Pulmonary Nodules Detected on CT Images: From the Fleischner Society 2017; Radiology 2017; 284:228-243. 5. Small left groin hernia contains only fat. 6. Aortic Atherosclerosis (ICD10-I70.0).  Echo 05/27/21  1. Left ventricular ejection fraction, by estimation, is 40 to 45%. Left  ventricular ejection fraction by 3D volume is 44 %. The left ventricle has  mildly decreased function. The left ventricle demonstrates global  hypokinesis. There is moderate concentric left ventricular hypertrophy. Left ventricular diastolic parameters are consistent with Grade I diastolic dysfunction (impaired relaxation). Elevated left ventricular end-diastolic pressure. The average left ventricular global longitudinal strain is -15.2 %. The global longitudinal strain is abnormal.   2. Right ventricular systolic function is normal. The right ventricular  size is normal. There is normal pulmonary artery systolic pressure.   3. Left atrial size was mildly dilated.   4. A small pericardial effusion is present. The pericardial effusion is  anterior to the right ventricle.   5. The mitral valve is normal in structure. Trivial mitral valve  regurgitation. No evidence of mitral stenosis.   6. The aortic valve is normal in structure. Aortic valve regurgitation is  not visualized. No aortic stenosis is present.   7. Aortic dilatation noted. There is mild dilatation of the aortic root,  measuring 41 mm. There is mild dilatation  of the ascending aorta,  measuring 38 mm.   8. The inferior vena cava is normal in size with greater than 50%  respiratory variability, suggesting right atrial pressure of 3 mmHg.  Comparison(s): 02/20/21 EF 40-45%. PA pressure .   US Thyroid 04/10/2021: IMPRESSION: 1. Indeterminate right supraclavicular soft tissue mass. No evidence of cervical lymphadenopathy. The mass would be amenable to percutaneous biopsy as clinically indicated. 2. Normal appearance of the thyroid gland.  Echo 02/20/2021: 1. Left ventricular ejection fraction, by estimation, is 40 to 45%. The  left ventricle has mildly decreased function. The left ventricle  demonstrates global hypokinesis. There is severe left ventricular  hypertrophy. Left ventricular diastolic parameters   are consistent with Grade II diastolic dysfunction (pseudonormalization).  Elevated left atrial pressure.   2. Right ventricular systolic function is mildly reduced. The right  ventricular size is normal. There is normal pulmonary artery systolic  pressure. The estimated right ventricular systolic pressure is 29.7 mmHg.   3. Left atrial size was moderately dilated.   4. The mitral valve is normal in structure. No evidence of mitral valve  regurgitation. No evidence of mitral stenosis.   5. The aortic valve is tricuspid. Aortic valve regurgitation is not  visualized. Mild aortic valve sclerosis is present, with no evidence of  aortic valve stenosis.   6. The inferior vena cava is normal in size with <50% respiratory  variability, suggesting right atrial pressure of 8 mmHg.   Comparison(s): Compared to prior echo, LV systolic function has improved.   RHC 07/18/20: Mildly elevated filling pressures, mild to moderate pulmonary hypertension and mildly reduced cardiac output.   RA: 8 mmHg RV: 53/3 mmHg PCW 15 mmHg PA: 50/17 mmHg with a mean of 29   AO sat 97% PA sat 60% Cardiac output: 3.92 with a cardiac index of 2.15.  Echo  07/10/20:  1. Left ventricular ejection fraction, by estimation, is 30 to 35%. The  left ventricle has moderately decreased function. The left ventricle  demonstrates global hypokinesis. There is moderate concentric left  ventricular hypertrophy. Left ventricular  diastolic parameters are consistent with Grade II diastolic dysfunction  (pseudonormalization).  Elevated left atrial pressure.   2. Right ventricular systolic function is moderately reduced. The right  ventricular size is mildly enlarged. Mildly increased right ventricular  wall thickness. There is moderately elevated pulmonary artery systolic  pressure. The estimated right  ventricular systolic pressure is 53.4 mmHg.   3. Left atrial size was severely dilated.   4. Right atrial size was moderately dilated.   5. The mitral valve is normal in structure. Mild mitral valve  regurgitation.   6. Tricuspid valve regurgitation is moderate.   7. The aortic valve is tricuspid. Aortic valve regurgitation is not  visualized. No aortic stenosis is present.   8. The inferior vena cava is dilated in size with <50% respiratory  variability, suggesting right atrial pressure of 15 mmHg.   LHC 11/15/18:  Prox LAD to Mid LAD lesion is 40% stenosed. Mid Cx to Dist Cx lesion is 25% stenosed. Ost 2nd Diag to 2nd Diag lesion is 50% stenosed. Ost 3rd Mrg lesion is 25% stenosed. Mid LAD lesion is 90% stenosed. A drug-eluting stent was successfully placed using a STENT SYNERGY DES [id number]. Post intervention, there is a 0% residual stenosis. LV end diastolic pressure is normal. There is no aortic valve stenosis. Ao 92%, PA 66%, mean PA 17 mm Hg, mean PCWP 3 mm Hg, CO 5.1; CI 2.8- normal right heart pressures Tortuous right subclavian. 3DRC was used for RCA.   Continue aggressive secondary prevention in addition to dual antiplatelet therapy.  Adequate diuresis based on right heart pressures.  Continue medical therapy for heart failure.    Recent  Labs: 12/25/2022: ALT 25; BUN 25; Creatinine 1.28; Hemoglobin 11.6; Platelet Count 156; Potassium 4.0; Sodium 137    Lipid Panel    Component Value Date/Time   CHOL 103 01/16/2021 1705   TRIG 66 01/16/2021 1705   HDL 47 01/16/2021 1705   CHOLHDL 2.2 01/16/2021 1705   CHOLHDL 3.6 07/12/2020 0510   VLDL 13 07/12/2020 0510   LDLCALC 42 01/16/2021 1705      Wt Readings from Last 3 Encounters:  12/29/22 145 lb 11.2 oz (66.1 kg)  12/25/22 146 lb 12 oz (66.6 kg)  08/07/22 155 lb 4.8 oz (70.4 kg)     ASSESSMENT AND PLAN:  Chronic combined systolic and diastolic heart failure (HCC) LVEF 40-45%.  He has mild lower extremity edema but no other heart failure symptoms.  He does report increased fatigue.  I wonder if this is due to heart failure or possibly due to bradycardia.  We will reduce carvedilol to 6.25 mg twice daily.  Continue Jardiance, Entresto, and spironolactone.  CAD in native artery Status post PCI.  He has no ischemic symptoms.  Continue aspirin.  Reduce carvedilol as above.  Continue rosuvastatin.  He will come back for fasting lipids and a CMP when able.  He struggles with transportation.  Pure hypercholesterolemia Continue rosuvastatin.  Check fasting lipids and CMP as above.  Disposition:    FU with APP in 2 months.  Medication Adjustments/Labs and Tests Ordered: Current medicines are reviewed at length with the patient today.  Concerns regarding medicines are outlined above.   Orders Placed This Encounter  Procedures   EKG 12-Lead   ECHOCARDIOGRAM COMPLETE   Meds ordered this encounter  Medications   carvedilol (COREG) 6.25 MG tablet    Sig: Take 1 tablet (6.25 mg total) by mouth 2 (two) times daily with a meal.    Dispense:  180 tablet    Refill:  3  Patient Instructions  Medication Instructions:  Your physician has recommended you make the following change in your medication:  Change: Carvedilol 6.25mg  twice daily  *If you need a refill on your  cardiac medications before your next appointment, please call your pharmacy*  Testing/Procedures: Your physician has requested that you have an echocardiogram. Echocardiography is a painless test that uses sound waves to create images of your heart. It provides your doctor with information about the size and shape of your heart and how well your heart's chambers and valves are working. This procedure takes approximately one hour. There are no restrictions for this procedure. Please do NOT wear cologne, perfume, aftershave, or lotions (deodorant is allowed). Please arrive 15 minutes prior to your appointment time.  Follow-Up: At [facility], you and your health needs are our priority.  As part of our continuing mission to provide you with exceptional heart care, we have created designated Provider Care Teams.  These Care Teams include your primary Cardiologist (physician) and Advanced Practice Providers (APPs -  Physician Assistants and Nurse Practitioners) who all work together to provide you with the care you need, when you need it.  We recommend signing up for the patient portal called "MyChart".  Sign up information is provided on this After Visit Summary.  MyChart is used to connect with patients for Virtual Visits (Telemedicine).  Patients are able to view lab/test results, encounter notes, upcoming appointments, etc.  Non-urgent messages can be sent to your provider as well.   To learn more about what you can do with MyChart, go to [email or web address].    Your next appointment:   2-3 month(s)  Provider:   Gillian Shields, NP    Other Instructions:  Tips to Measure your Blood Pressure Correctly  To determine whether you have hypertension, a medical professional will take a blood pressure reading. How you prepare for the test, the position of your arm, and other factors can change a blood pressure reading by 10% or more. That could be enough to hide high blood pressure, start  you on a drug you don't really need, or lead your doctor to incorrectly adjust your medications.  National and international guidelines offer specific instructions for measuring blood pressure. If a doctor, nurse, or medical assistant isn't doing it right, don't hesitate to ask him or her to get with the guidelines.  Here's what you can do to ensure a correct reading:  Don't drink a caffeinated beverage or smoke during the 30 minutes before the test.  Sit quietly for five minutes before the test begins.  During the measurement, sit in a chair with your feet on the floor and your arm supported so your elbow is at about heart level.  The inflatable part of the cuff should completely cover at least 80% of your upper arm, and the cuff should be placed on bare skin, not over a shirt.  Don't talk during the measurement.  Have your blood pressure measured twice, with a brief break in between. If the readings are different by 5 points or more, have it done a third time.  In 2017, new guidelines from the American Heart Association, the [employer] of Cardiology, and nine other health organizations lowered the diagnosis of high blood pressure to 130/80 mm Hg or higher for all adults. The guidelines also redefined the various blood pressure categories to now include normal, elevated, Stage 1 hypertension, Stage 2 hypertension, and hypertensive crisis (see "Blood pressure categories").  Blood pressure categories  Blood pressure category SYSTOLIC (upper number)  DIASTOLIC (lower number)  Normal Less than 120 mm Hg and Less than 80 mm Hg  Elevated 120-129 mm Hg and Less than 80 mm Hg  High blood pressure: Stage 1 hypertension 130-139 mm Hg or 80-89 mm Hg  High blood pressure: Stage 2 hypertension 140 mm Hg or higher or 90 mm Hg or higher  Hypertensive crisis (consult your doctor immediately) Higher than 180 mm Hg and/or Higher than 120 mm Hg  Source: American Heart Association and American Stroke  Association. For more on getting your blood pressure under control, buy Controlling Your Blood Pressure, a Special Health Report from [facility].   Blood Pressure Log   Date   Time  Blood Pressure  Position  Example: Nov 1 9 AM 124/78 sitting                                                          I,Mathew Stumpf,acting as a Neurosurgeon for [employer], MD.,have documented all relevant documentation on the behalf of Chilton Si, MD,as directed by  Chilton Si, MD while in the presence of Chilton Si, MD.  I, Gorge Almanza C. Duke Salvia, MD have reviewed all documentation for this visit.  The documentation of the exam, diagnosis, procedures, and orders on 12/29/2022 are all accurate and complete.  Signed, Madelon Welsch C. Duke Salvia, MD, [facility]  12/29/2022 5:19 PM    [facility]

## 2022-12-29 NOTE — Patient Instructions (Addendum)
Medication Instructions:  Your physician has recommended you make the following change in your medication:  Change: Carvedilol 6.25mg  twice daily  *If you need a refill on your cardiac medications before your next appointment, please call your pharmacy*  Testing/Procedures: Your physician has requested that you have an echocardiogram. Echocardiography is a painless test that uses sound waves to create images of your heart. It provides your doctor with information about the size and shape of your heart and how well your heart's chambers and valves are working. This procedure takes approximately one hour. There are no restrictions for this procedure. Please do NOT wear cologne, perfume, aftershave, or lotions (deodorant is allowed). Please arrive 15 minutes prior to your appointment time.  Follow-Up: At [facility], you and your health needs are our priority.  As part of our continuing mission to provide you with exceptional heart care, we have created designated Provider Care Teams.  These Care Teams include your primary Cardiologist (physician) and Advanced Practice Providers (APPs -  Physician Assistants and Nurse Practitioners) who all work together to provide you with the care you need, when you need it.  We recommend signing up for the patient portal called "MyChart".  Sign up information is provided on this After Visit Summary.  MyChart is used to connect with patients for Virtual Visits (Telemedicine).  Patients are able to view lab/test results, encounter notes, upcoming appointments, etc.  Non-urgent messages can be sent to your provider as well.   To learn more about what you can do with MyChart, go to [email or web address].    Your next appointment:   2-3 month(s)  Provider:   Gillian Shields, NP    Other Instructions:  Tips to Measure your Blood Pressure Correctly  To determine whether you have hypertension, a medical professional will take a blood pressure reading.  How you prepare for the test, the position of your arm, and other factors can change a blood pressure reading by 10% or more. That could be enough to hide high blood pressure, start you on a drug you don't really need, or lead your doctor to incorrectly adjust your medications.  National and international guidelines offer specific instructions for measuring blood pressure. If a doctor, nurse, or medical assistant isn't doing it right, don't hesitate to ask him or her to get with the guidelines.  Here's what you can do to ensure a correct reading:  Don't drink a caffeinated beverage or smoke during the 30 minutes before the test.  Sit quietly for five minutes before the test begins.  During the measurement, sit in a chair with your feet on the floor and your arm supported so your elbow is at about heart level.  The inflatable part of the cuff should completely cover at least 80% of your upper arm, and the cuff should be placed on bare skin, not over a shirt.  Don't talk during the measurement.  Have your blood pressure measured twice, with a brief break in between. If the readings are different by 5 points or more, have it done a third time.  In 2017, new guidelines from the American Heart Association, the [employer] of Cardiology, and nine other health organizations lowered the diagnosis of high blood pressure to 130/80 mm Hg or higher for all adults. The guidelines also redefined the various blood pressure categories to now include normal, elevated, Stage 1 hypertension, Stage 2 hypertension, and hypertensive crisis (see "Blood pressure categories").  Blood pressure categories  Blood pressure category SYSTOLIC (  upper number)  DIASTOLIC (lower number)  Normal Less than 120 mm Hg and Less than 80 mm Hg  Elevated 120-129 mm Hg and Less than 80 mm Hg  High blood pressure: Stage 1 hypertension 130-139 mm Hg or 80-89 mm Hg  High blood pressure: Stage 2 hypertension 140 mm Hg or higher or 90 mm  Hg or higher  Hypertensive crisis (consult your doctor immediately) Higher than 180 mm Hg and/or Higher than 120 mm Hg  Source: American Heart Association and American Stroke Association. For more on getting your blood pressure under control, buy Controlling Your Blood Pressure, a Special Health Report from [facility].   Blood Pressure Log   Date   Time  Blood Pressure  Position  Example: Nov 1 9 AM 124/78 sitting

## 2022-12-29 NOTE — Assessment & Plan Note (Signed)
LVEF 40-45%.  He has mild lower extremity edema but no other heart failure symptoms.  He does report increased fatigue.  I wonder if this is due to heart failure or possibly due to bradycardia.  We will reduce carvedilol to 6.25 mg twice daily.  Continue Jardiance, Entresto, and spironolactone.

## 2022-12-29 NOTE — Telephone Encounter (Signed)
Per Aneta Mins patient had question about medication from today Unable to speak with him when he called Returned call, Left message to call back

## 2023-01-01 LAB — PROTEIN ELECTROPHORESIS, SERUM, WITH REFLEX
A/G Ratio: 0.9 (ref 0.7–1.7)
Albumin ELP: 3.6 g/dL (ref 2.9–4.4)
Alpha-1-Globulin: 0.2 g/dL (ref 0.0–0.4)
Alpha-2-Globulin: 0.7 g/dL (ref 0.4–1.0)
Beta Globulin: 0.8 g/dL (ref 0.7–1.3)
Gamma Globulin: 2.2 g/dL — ABNORMAL HIGH (ref 0.4–1.8)
Globulin, Total: 4 g/dL — ABNORMAL HIGH (ref 2.2–3.9)
M-Spike, %: 1.6 g/dL — ABNORMAL HIGH
SPEP Interpretation: 0
Total Protein ELP: 7.6 g/dL (ref 6.0–8.5)

## 2023-01-01 LAB — IMMUNOFIXATION REFLEX, SERUM
IgA: 218 mg/dL (ref 61–437)
IgG (Immunoglobin G), Serum: 2410 mg/dL — ABNORMAL HIGH (ref 603–1613)
IgM (Immunoglobulin M), Srm: 63 mg/dL (ref 20–172)

## 2023-01-01 NOTE — Telephone Encounter (Signed)
Left message to call back  

## 2023-01-06 ENCOUNTER — Other Ambulatory Visit: Payer: Self-pay | Admitting: Cardiovascular Disease

## 2023-01-15 NOTE — Progress Notes (Signed)
[facility] - Clinic Note  01/19/2023    CHIEF COMPLAINT Patient presents for Retina Follow Up  HISTORY OF PRESENT ILLNESS: Earl Calderon is a 72 y.o. male who presents to the clinic today for:   HPI     Retina Follow Up   Patient presents with  Diabetic Retinopathy.  In both eyes.  Severity is moderate.  Duration of 6 weeks.  Since onset it is stable.  I, the attending physician,  performed the HPI with the patient and updated documentation appropriately.        Comments   Pt here for 6 wk ret f/u PDR OU. Pt states VA the same, no changes noted.       Last edited by Rennis Chris, MD on 01/19/2023  4:39 PM.      Referring physician: Olivia Canter, MD [address] [place],  [place] [address]  HISTORICAL INFORMATION:   Selected notes from the MEDICAL RECORD NUMBER Referred by Dr. Laruth Bouchard for severe NPDR OU   CURRENT MEDICATIONS: No current outpatient medications on file. (Ophthalmic Drugs)   No current facility-administered medications for this visit. (Ophthalmic Drugs)   Current Outpatient Medications (Other)  Medication Sig   APPLE CIDER VINEGAR PO Take 1 tablet by mouth daily at 6 (six) AM.   aspirin EC (ASPIRIN LOW DOSE) 81 MG tablet Take 1 tablet (81 mg total) by mouth daily. Swallow whole.   carvedilol (COREG) 6.25 MG tablet Take 1 tablet (6.25 mg total) by mouth 2 (two) times daily with a meal.   ELDERBERRY PO Take 100 mg by mouth daily.   ferrous sulfate 325 (65 FE) MG tablet Take 1 tablet (325 mg total) by mouth daily with breakfast.   furosemide (LASIX) 40 MG tablet TAKE 1 TABLET BY MOUTH ONCE DAILY   glucose blood (FREESTYLE TEST STRIPS) test strip Use as instructed   HUMALOG KWIKPEN 100 UNIT/ML KwikPen Inject into the skin. Sliding Scale   hydrALAZINE (APRESOLINE) 25 MG tablet TAKE 1 TABLET BY MOUTH IN THE MORNING, AND TAKE 1 TABLET AT BEDTIME   Insulin Pen Needle (NOVOFINE PEN NEEDLE) 32G X 6 MM MISC USE AS DIRECTED    JARDIANCE 10 MG TABS tablet TAKE ONE TABLET BY MOUTH DAILY BEFORE BREAKFAST   LEVEMIR FLEXTOUCH 100 UNIT/ML FlexPen Inject 8 Units into the skin 2 (two) times daily.   Probiotic Product (PROBIOTIC MULTI-ENZYME) TABS Take 3 tablets by mouth daily at 6 (six) AM.   rosuvastatin (CRESTOR) 40 MG tablet TAKE 1 TABLET BY MOUTH ONCE DAILY AT 6PM   sacubitril-valsartan (ENTRESTO) 97-103 MG TAKE ONE TABLET BY MOUTH TWO TIMES DAILY   spironolactone (ALDACTONE) 25 MG tablet TAKE 1 TABLET BY MOUTH ONCE DAILY   Turmeric 500 MG TABS Take 2 tablets by mouth daily at 6 (six) AM.   nitroGLYCERIN (NITROSTAT) 0.4 MG SL tablet Place 1 tablet (0.4 mg total) under the tongue every 5 (five) minutes as needed for chest pain.   No current facility-administered medications for this visit. (Other)   REVIEW OF SYSTEMS: ROS   Positive for: Endocrine, Cardiovascular, Eyes Negative for: Constitutional, Gastrointestinal, Neurological, Skin, Genitourinary, Musculoskeletal, HENT, Respiratory, Psychiatric, Allergic/Imm, Heme/Lymph Last edited by Mousel Grayer, COT on 01/19/2023  1:32 PM.       ALLERGIES No Known Allergies  PAST MEDICAL HISTORY Past Medical History:  Diagnosis Date   CAD in native artery 01/01/2021   Prior LAD PCI.  Currently no symptoms of ischemia.  Encouraged him to do some light exercising.  Continue aspirin, carvedilol, and rosuvastatin.   Cataract    CKD (chronic kidney disease), stage III (HCC) 08/24/2020   Coronary artery disease    Diabetes mellitus without complication (HCC)    on meds   Diabetic retinopathy (HCC)    Goals of care, counseling/discussion 07/01/2021   Hernia, inguinal    Hypertension    on meds   Hypertensive retinopathy    Lymphadenopathy, cervical 07/01/2021   MGUS (monoclonal gammopathy of unknown significance) 07/01/2021   NSVT (nonsustained ventricular tachycardia) (HCC) 08/24/2020   Pure hypercholesterolemia 08/05/2021   Resistant hypertension 11/11/2018    Uncontrolled type 2 diabetes mellitus 11/11/2018   Past Surgical History:  Procedure Laterality Date   CORONARY STENT INTERVENTION N/A 11/15/2018   Procedure: CORONARY STENT INTERVENTION;  Surgeon: Corky Crafts, MD;  Location: [facility] INVASIVE CV LAB;  Service: Cardiovascular;  Laterality: N/A;   HEMORROIDECTOMY     RIGHT HEART CATH N/A 07/18/2020   Procedure: RIGHT HEART CATH;  Surgeon: Iran Ouch, MD;  Location: [facility] INVASIVE CV LAB;  Service: Cardiovascular;  Laterality: N/A;   RIGHT/LEFT HEART CATH AND CORONARY ANGIOGRAPHY N/A 11/15/2018   Procedure: RIGHT/LEFT HEART CATH AND CORONARY ANGIOGRAPHY;  Surgeon: Corky Crafts, MD;  Location: [facility] INVASIVE CV LAB;  Service: Cardiovascular;  Laterality: N/A;   WISDOM TOOTH EXTRACTION     FAMILY HISTORY Family History  Problem Relation Age of Onset   Hypertension Mother    Diabetes Mellitus II Mother    Arrhythmia Mother    Heart disease Mother    Hypertension Sister    Hypertension Brother    Heart disease Brother    Kidney disease Brother    Hypertension Other    Diabetes Mellitus II Other        All 9 sibblings have HTN   Colon cancer Neg Hx    Colon polyps Neg Hx    Esophageal cancer Neg Hx    Stomach cancer Neg Hx    Rectal cancer Neg Hx    SOCIAL HISTORY Social History   Tobacco Use   Smoking status: Never   Smokeless tobacco: Never  Vaping Use   Vaping Use: Never used  Substance Use Topics   Alcohol use: Not Currently   Drug use: Never       OPHTHALMIC EXAM:  Base Eye Exam     Visual Acuity (Snellen - Linear)       Right Left   Dist [place] 20/40 +1 20/50 -2   Dist ph [place] NI NI         Tonometry (Tonopen, 1:36 PM)       Right Left   Pressure 15 12         Pupils       Pupils Dark Light Shape React APD   Right PERRL 3 2 Round Brisk None   Left PERRL 3 2 Round Brisk None         Visual Fields (Counting fingers)       Left Right   Restrictions Partial outer superior temporal  deficiency Partial outer superior nasal deficiency         Extraocular Movement       Right Left    Full, Ortho Full, Ortho         Neuro/Psych     Oriented x3: Yes   Mood/Affect: Normal         Dilation     Both eyes: 1.0% Mydriacyl,  2.5% Phenylephrine @ 1:37 PM           Slit Lamp and Fundus Exam     Slit Lamp Exam       Right Left   Lids/Lashes Dermatochalasis - upper lid Dermatochalasis - upper lid   Conjunctiva/Sclera Melanosis Melanosis   Cornea arcus, fine endo pigment arcus   Anterior Chamber deep and clear, no cell or flare deep and clear   Iris Round and dilated, No NVI Round and dilated, No NVI   Lens 2-3+ Nuclear sclerosis, 2-3+ Cortical cataract 2-3+ Nuclear sclerosis, 2-3+ Cortical cataract   Anterior Vitreous Vitreous syneresis Vitreous syneresis         Fundus Exam       Right Left   Disc Pink and Sharp, no NVD, +PPP Pink and Sharp, no NVD, +PPP   C/D Ratio 0.6 0.4   Macula good foveal reflex, central edema -- persistent, scattered MA/DBH - improved, focal exudates temporal macula -- improved good foveal reflex, scattered MA/DBH, +cystic changes temporal macula -- slightly improved, no exudates and CWS   Vessels attenuated, tortuous, focal NV IN arcades--regressing, mild copper wiring attenuated, Tortuous   Periphery Attached, 360 MA/DBH greatest posteriorly, light 360 PRP changes with room for fill in if needed Attached, 360 DBH           IMAGING AND PROCEDURES  Imaging and Procedures for 01/19/2023  OCT, Retina - OU - Both Eyes       Right Eye Quality was good. Central Foveal Thickness: 259. Progression has worsened. Findings include no SRF, abnormal foveal contour, intraretinal hyper-reflective material, intraretinal fluid, vitreomacular adhesion (Mild interval increase in IRF greatest nasal and central macula, partial PVD).   Left Eye Quality was good. Central Foveal Thickness: 206. Progression has improved. Findings include  normal foveal contour, no SRF, intraretinal hyper-reflective material, intraretinal fluid, vitreomacular adhesion (persistent IRF/cystic changes temporal fovea and macula -- slightly improved, patchy ORA).   Notes *Images captured and stored on drive  Diagnosis / Impression:  +DME OU OD: Mild interval increase in IRF greatest nasal and central macula, partial PVD OS: persistent IRF/cystic changes temporal fovea and macula -- slightly improved, patchy ORA  Clinical management:  See below  Abbreviations: NFP - Normal foveal profile. CME - cystoid macular edema. PED - pigment epithelial detachment. IRF - intraretinal fluid. SRF - subretinal fluid. EZ - ellipsoid zone. ERM - epiretinal membrane. ORA - outer retinal atrophy. ORT - outer retinal tubulation. SRHM - subretinal hyper-reflective material. IRHM - intraretinal hyper-reflective material      Intravitreal Injection, Pharmacologic Agent - OD - Right Eye       Time Out 01/19/2023. 3:06 PM. Confirmed correct patient, procedure, site, and patient consented.   Anesthesia Topical anesthesia was used. Anesthetic medications included Lidocaine 2%, Proparacaine 0.5%.   Procedure Preparation included 5% betadine to ocular surface, eyelid speculum. A supplied (32g) needle was used.   Injection: 1.25 mg Bevacizumab 1.25mg /0.35ml   Route: Intravitreal, Site: Right Eye   NDC: P3213405, Lot: [id number], Expiration date: 02/23/2023   Post-op Post injection exam found visual acuity of at least counting fingers. The patient tolerated the procedure well. There were no complications. The patient received written and verbal post procedure care education. Post injection medications were not given.      Intravitreal Injection, Pharmacologic Agent - OS - Left Eye       Time Out 01/19/2023. 3:06 PM. Confirmed correct patient, procedure, site, and patient consented.   Anesthesia Topical anesthesia was  used. Anesthetic medications included  Lidocaine 2%, Proparacaine 0.5%.   Procedure Preparation included 5% betadine to ocular surface, eyelid speculum. A (32g) needle was used.   Injection: 1.25 mg Bevacizumab 1.25mg /0.39ml   Route: Intravitreal, Site: Left Eye   NDC: P3213405, Lot: [id number], Expiration date: 03/26/2023   Post-op Post injection exam found visual acuity of at least counting fingers. The patient tolerated the procedure well. There were no complications. The patient received written and verbal post procedure care education. Post injection medications were not given.            ASSESSMENT/PLAN:    ICD-10-CM   1. Proliferative diabetic retinopathy of right eye with macular edema associated with type 2 diabetes mellitus (HCC)  E11.3511 OCT, Retina - OU - Both Eyes    Intravitreal Injection, Pharmacologic Agent - OD - Right Eye    Intravitreal Injection, Pharmacologic Agent - OS - Left Eye    Bevacizumab (AVASTIN) SOLN 1.25 mg    Bevacizumab (AVASTIN) SOLN 1.25 mg    2. Severe nonproliferative diabetic retinopathy of left eye with macular edema associated with type 2 diabetes mellitus (HCC)  F62.1308     3. Current use of insulin (HCC)  Z79.4     4. Long term (current) use of oral hypoglycemic drugs  Z79.84     5. Essential hypertension  I10     6. Hypertensive retinopathy of both eyes  H35.033     7. Combined forms of age-related cataract of both eyes  H25.813      1-4. Proliferative diabetic retinopathy OD  - severe NPDR OS - delayed to follow up from 10.28.22 to 01.04.23 (9+ wks) - s/p IVA OU #1 (06.06.22), #2 (07.08.22), #3 (08.05.22), #4 (09.02.22), #5 (09.30.22), #6 (10.28.22), #7 (01.04.23), #8 (02.20.33), #9 (04.03.23), #10 (05.15.23), #11 (06.28.23), #12 (08.16.23), #13 (10.10.23), #14 (11.14.23), #15 (01.15.24), #16 (02.26.24) , #17 (04.03.24) **h/o increased IRF/DME OU at 7 wks, noted on 08.16.23**  - s/p PRP OD (06.10.22) - exam shows central edema OU, scattered IRH -- improving -  FA (06.06.22) shows OD: focal NVE IN arcades; OS: no NV OS; late leaking MA's OU -- s/p PRP OD 6.10.22 - BCVA -- OD 20/40 (stable); OS 20/50 from 20/60 - OCT shows OD: Mild interval increase in IRF greatest nasal and central macula, partial PVD; OS: persistent IRF/cystic changes temporal fovea and macula -- slightly improved, patchy ORA **discussed decreased efficacy / resistance to Avastin and potential benefit of switching medication** - recommend IVA OU #18 today, 05.20.24 w/ f/u in 5 wks - pt wishes to proceed - RBA of procedure discussed, questions answered - IVA informed consent obtained and re-signed (OU), 06.28.23 - see procedure note - will check Eylea authorization for next visit -- approved - f/u 5 weeks, DFE/OCT, possible injection(s)  5,6. Hypertensive retinopathy OU - discussed importance of tight BP control - monitor  7. Mixed Cataract OU - The symptoms of cataract, surgical options, and treatments and risks were discussed with patient. - discussed diagnosis and progression - clear from a retina standpoint to proceed with cataract surgery when both patient and surgeon are ready - pt had surgery scheduled on April 3rd with Dr. Zenaida Niece -- surgery was canceled due to pt not having anyone to drive him, pt has not rescheduled appt yet  Ophthalmic Meds Ordered this visit:  Meds ordered this encounter  Medications   Bevacizumab (AVASTIN) SOLN 1.25 mg   Bevacizumab (AVASTIN) SOLN 1.25 mg     Return in about  5 weeks (around 02/23/2023) for f/u PDR OU OD, DFE, OCT.  There are no Patient Instructions on file for this visit.  This document serves as a record of services personally performed by Karie Chimera, MD, PhD. It was created on their behalf by De Blanch, an ophthalmic technician. The creation of this record is the provider's dictation and/or activities during the visit.    Electronically signed by: De Blanch, OA, 01/19/23  4:40 PM  This document serves as a  record of services personally performed by Karie Chimera, MD, PhD. It was created on their behalf by Glee Arvin. Manson Passey, OA an ophthalmic technician. The creation of this record is the provider's dictation and/or activities during the visit.    Electronically signed by: Glee Arvin. [place], [place] 05.20.2024 4:40 PM  Karie Chimera, M.D., Ph.D. Diseases & Surgery of the Retina and Vitreous [facility]  I have reviewed the above documentation for accuracy and completeness, and I agree with the above. Karie Chimera, M.D., Ph.D. 01/19/23 4:41 PM   Abbreviations: M myopia (nearsighted); A astigmatism; H hyperopia (farsighted); P presbyopia; Mrx spectacle prescription;  CTL contact lenses; OD right eye; OS left eye; OU both eyes  XT exotropia; ET esotropia; PEK punctate epithelial keratitis; PEE punctate epithelial erosions; DES dry eye syndrome; MGD meibomian gland dysfunction; ATs artificial tears; PFAT's preservative free artificial tears; NSC nuclear sclerotic cataract; PSC posterior subcapsular cataract; ERM epi-retinal membrane; PVD posterior vitreous detachment; RD retinal detachment; DM diabetes mellitus; DR diabetic retinopathy; NPDR non-proliferative diabetic retinopathy; PDR proliferative diabetic retinopathy; CSME clinically significant macular edema; DME diabetic macular edema; dbh dot blot hemorrhages; CWS cotton wool spot; POAG primary open angle glaucoma; C/D cup-to-disc ratio; HVF humphrey visual field; GVF goldmann visual field; OCT optical coherence tomography; IOP intraocular pressure; BRVO Branch retinal vein occlusion; CRVO central retinal vein occlusion; CRAO central retinal artery occlusion; BRAO branch retinal artery occlusion; RT retinal tear; SB scleral buckle; PPV pars plana vitrectomy; VH Vitreous hemorrhage; PRP panretinal laser photocoagulation; IVK intravitreal kenalog; VMT vitreomacular traction; MH Macular hole;  NVD neovascularization of the disc; NVE  neovascularization elsewhere; AREDS age related eye disease study; ARMD age related macular degeneration; POAG primary open angle glaucoma; EBMD epithelial/anterior basement membrane dystrophy; ACIOL anterior chamber intraocular lens; IOL intraocular lens; PCIOL posterior chamber intraocular lens; Phaco/IOL phacoemulsification with intraocular lens placement; PRK photorefractive keratectomy; LASIK laser assisted in situ keratomileusis; HTN hypertension; DM diabetes mellitus; COPD chronic obstructive pulmonary disease

## 2023-01-19 ENCOUNTER — Encounter (INDEPENDENT_AMBULATORY_CARE_PROVIDER_SITE_OTHER): Payer: Self-pay | Admitting: Ophthalmology

## 2023-01-19 ENCOUNTER — Ambulatory Visit (INDEPENDENT_AMBULATORY_CARE_PROVIDER_SITE_OTHER): Payer: 59 | Admitting: Ophthalmology

## 2023-01-19 DIAGNOSIS — Z7984 Long term (current) use of oral hypoglycemic drugs: Secondary | ICD-10-CM | POA: Diagnosis not present

## 2023-01-19 DIAGNOSIS — E113412 Type 2 diabetes mellitus with severe nonproliferative diabetic retinopathy with macular edema, left eye: Secondary | ICD-10-CM | POA: Diagnosis not present

## 2023-01-19 DIAGNOSIS — E113511 Type 2 diabetes mellitus with proliferative diabetic retinopathy with macular edema, right eye: Secondary | ICD-10-CM

## 2023-01-19 DIAGNOSIS — Z794 Long term (current) use of insulin: Secondary | ICD-10-CM | POA: Diagnosis not present

## 2023-01-19 DIAGNOSIS — H25813 Combined forms of age-related cataract, bilateral: Secondary | ICD-10-CM | POA: Diagnosis not present

## 2023-01-19 DIAGNOSIS — H35033 Hypertensive retinopathy, bilateral: Secondary | ICD-10-CM

## 2023-01-19 DIAGNOSIS — I10 Essential (primary) hypertension: Secondary | ICD-10-CM

## 2023-01-19 MED ORDER — BEVACIZUMAB CHEMO INJECTION 1.25MG/0.05ML SYRINGE FOR KALEIDOSCOPE
1.2500 mg | INTRAVITREAL | Status: AC | PRN
Start: 2023-01-19 — End: 2023-01-19
  Administered 2023-01-19: 1.25 mg via INTRAVITREAL

## 2023-01-29 ENCOUNTER — Ambulatory Visit (INDEPENDENT_AMBULATORY_CARE_PROVIDER_SITE_OTHER): Payer: 59

## 2023-01-29 DIAGNOSIS — I2510 Atherosclerotic heart disease of native coronary artery without angina pectoris: Secondary | ICD-10-CM

## 2023-01-29 DIAGNOSIS — I5042 Chronic combined systolic (congestive) and diastolic (congestive) heart failure: Secondary | ICD-10-CM | POA: Diagnosis not present

## 2023-01-29 LAB — ECHOCARDIOGRAM COMPLETE
Area-P 1/2: 4.21 cm2
S' Lateral: 3.37 cm

## 2023-01-30 ENCOUNTER — Other Ambulatory Visit (HOSPITAL_BASED_OUTPATIENT_CLINIC_OR_DEPARTMENT_OTHER): Payer: Self-pay | Admitting: *Deleted

## 2023-01-30 DIAGNOSIS — I5042 Chronic combined systolic (congestive) and diastolic (congestive) heart failure: Secondary | ICD-10-CM

## 2023-01-30 DIAGNOSIS — I10 Essential (primary) hypertension: Secondary | ICD-10-CM

## 2023-02-05 DIAGNOSIS — H25813 Combined forms of age-related cataract, bilateral: Secondary | ICD-10-CM | POA: Diagnosis not present

## 2023-02-12 NOTE — Progress Notes (Signed)
[facility] - Clinic Note  02/23/2023    CHIEF COMPLAINT Patient presents for Retina Follow Up  HISTORY OF PRESENT ILLNESS: Earl Calderon is a 72 y.o. male who presents to the clinic today for:   HPI     Retina Follow Up   Patient presents with  Diabetic Retinopathy.  In both eyes.  This started 5 weeks ago.  Duration of 5 weeks.  Since onset it is stable.  I, the attending physician,  performed the HPI with the patient and updated documentation appropriately.        Comments   5 week retina follow up PDR and IVA OU pt is reporting no vision changes noticed he denies any flashes or floaters       Last edited by Rennis Chris, MD on 02/23/2023  4:17 PM.     Referring physician: Olivia Canter, MD [address] [place],  [place] [address]  HISTORICAL INFORMATION:   Selected notes from the MEDICAL RECORD NUMBER Referred by Dr. Laruth Bouchard for severe NPDR OU   CURRENT MEDICATIONS: No current outpatient medications on file. (Ophthalmic Drugs)   No current facility-administered medications for this visit. (Ophthalmic Drugs)   Current Outpatient Medications (Other)  Medication Sig   APPLE CIDER VINEGAR PO Take 1 tablet by mouth daily at 6 (six) AM.   aspirin EC (ASPIRIN LOW DOSE) 81 MG tablet Take 1 tablet (81 mg total) by mouth daily. Swallow whole.   carvedilol (COREG) 6.25 MG tablet Take 1 tablet (6.25 mg total) by mouth 2 (two) times daily with a meal.   ELDERBERRY PO Take 100 mg by mouth daily.   ferrous sulfate 325 (65 FE) MG tablet Take 1 tablet (325 mg total) by mouth daily with breakfast.   furosemide (LASIX) 40 MG tablet TAKE 1 TABLET BY MOUTH ONCE DAILY   glucose blood (FREESTYLE TEST STRIPS) test strip Use as instructed   HUMALOG KWIKPEN 100 UNIT/ML KwikPen Inject into the skin. Sliding Scale   hydrALAZINE (APRESOLINE) 25 MG tablet TAKE 1 TABLET BY MOUTH IN THE MORNING, AND TAKE 1 TABLET AT BEDTIME   Insulin Pen Needle (NOVOFINE PEN  NEEDLE) 32G X 6 MM MISC USE AS DIRECTED   JARDIANCE 10 MG TABS tablet TAKE ONE TABLET BY MOUTH DAILY BEFORE BREAKFAST   LEVEMIR FLEXTOUCH 100 UNIT/ML FlexPen Inject 8 Units into the skin 2 (two) times daily.   nitroGLYCERIN (NITROSTAT) 0.4 MG SL tablet Place 1 tablet (0.4 mg total) under the tongue every 5 (five) minutes as needed for chest pain.   Probiotic Product (PROBIOTIC MULTI-ENZYME) TABS Take 3 tablets by mouth daily at 6 (six) AM.   rosuvastatin (CRESTOR) 40 MG tablet TAKE 1 TABLET BY MOUTH ONCE DAILY AT 6PM   sacubitril-valsartan (ENTRESTO) 97-103 MG TAKE ONE TABLET BY MOUTH TWO TIMES DAILY   spironolactone (ALDACTONE) 25 MG tablet TAKE 1 TABLET BY MOUTH ONCE DAILY   Turmeric 500 MG TABS Take 2 tablets by mouth daily at 6 (six) AM.   No current facility-administered medications for this visit. (Other)   REVIEW OF SYSTEMS: ROS   Positive for: Endocrine, Cardiovascular, Eyes Negative for: Constitutional, Gastrointestinal, Neurological, Skin, Genitourinary, Musculoskeletal, HENT, Respiratory, Psychiatric, Allergic/Imm, Heme/Lymph Last edited by Etheleen Mayhew, COT on 02/23/2023  1:23 PM.     ALLERGIES No Known Allergies  PAST MEDICAL HISTORY Past Medical History:  Diagnosis Date   CAD in native artery 01/01/2021   Prior LAD PCI.  Currently no  symptoms of ischemia.  Encouraged him to do some light exercising.  Continue aspirin, carvedilol, and rosuvastatin.   Cataract    CKD (chronic kidney disease), stage III (HCC) 08/24/2020   Coronary artery disease    Diabetes mellitus without complication (HCC)    on meds   Diabetic retinopathy (HCC)    Goals of care, counseling/discussion 07/01/2021   Hernia, inguinal    Hypertension    on meds   Hypertensive retinopathy    Lymphadenopathy, cervical 07/01/2021   MGUS (monoclonal gammopathy of unknown significance) 07/01/2021   NSVT (nonsustained ventricular tachycardia) (HCC) 08/24/2020   Pure hypercholesterolemia  08/05/2021   Resistant hypertension 11/11/2018   Uncontrolled type 2 diabetes mellitus 11/11/2018   Past Surgical History:  Procedure Laterality Date   CORONARY STENT INTERVENTION N/A 11/15/2018   Procedure: CORONARY STENT INTERVENTION;  Surgeon: Corky Crafts, MD;  Location: [facility] INVASIVE CV LAB;  Service: Cardiovascular;  Laterality: N/A;   HEMORROIDECTOMY     RIGHT HEART CATH N/A 07/18/2020   Procedure: RIGHT HEART CATH;  Surgeon: Iran Ouch, MD;  Location: [facility] INVASIVE CV LAB;  Service: Cardiovascular;  Laterality: N/A;   RIGHT/LEFT HEART CATH AND CORONARY ANGIOGRAPHY N/A 11/15/2018   Procedure: RIGHT/LEFT HEART CATH AND CORONARY ANGIOGRAPHY;  Surgeon: Corky Crafts, MD;  Location: [facility] INVASIVE CV LAB;  Service: Cardiovascular;  Laterality: N/A;   WISDOM TOOTH EXTRACTION     FAMILY HISTORY Family History  Problem Relation Age of Onset   Hypertension Mother    Diabetes Mellitus II Mother    Arrhythmia Mother    Heart disease Mother    Hypertension Sister    Hypertension Brother    Heart disease Brother    Kidney disease Brother    Hypertension Other    Diabetes Mellitus II Other        All 9 sibblings have HTN   Colon cancer Neg Hx    Colon polyps Neg Hx    Esophageal cancer Neg Hx    Stomach cancer Neg Hx    Rectal cancer Neg Hx    SOCIAL HISTORY Social History   Tobacco Use   Smoking status: Never   Smokeless tobacco: Never  Vaping Use   Vaping Use: Never used  Substance Use Topics   Alcohol use: Not Currently   Drug use: Never       OPHTHALMIC EXAM:  Base Eye Exam     Visual Acuity (Snellen - Linear)       Right Left   Dist [place] 20/40 -1 20/60   Dist ph [place] NI NI         Tonometry (Tonopen, 1:27 PM)       Right Left   Pressure 16 18         Pupils       Pupils Dark Light Shape React APD   Right PERRL 3 2 Round Brisk None   Left PERRL 3 2 Round Brisk None         Visual Fields       Left Right   Restrictions Partial  outer superior temporal deficiency Partial outer superior nasal deficiency         Extraocular Movement       Right Left    Full, Ortho Full, Ortho         Neuro/Psych     Oriented x3: Yes   Mood/Affect: Normal         Dilation     Both eyes: 2.5%  Phenylephrine @ 1:27 PM           Slit Lamp and Fundus Exam     Slit Lamp Exam       Right Left   Lids/Lashes Dermatochalasis - upper lid Dermatochalasis - upper lid   Conjunctiva/Sclera Melanosis Melanosis   Cornea arcus, fine endo pigment arcus   Anterior Chamber deep and clear, no cell or flare deep and clear   Iris Round and dilated, No NVI Round and dilated, No NVI   Lens 2-3+ Nuclear sclerosis, 2-3+ Cortical cataract 2-3+ Nuclear sclerosis, 2-3+ Cortical cataract   Anterior Vitreous Vitreous syneresis Vitreous syneresis         Fundus Exam       Right Left   Disc Pink and Sharp, no NVD, +PPP Pink and Sharp, no NVD, +PPP   C/D Ratio 0.6 0.4   Macula good foveal reflex, central edema -- persistent, scattered MA/DBH - improved, focal exudates temporal macula -- improved good foveal reflex, scattered MA/DBH, +cystic changes temporal macula -- slightly improved, no exudates or CWS   Vessels attenuated, tortuous, focal NV IN arcades -- regressing, mild copper wiring attenuated, Tortuous   Periphery Attached, 360 MA/DBH greatest posteriorly, light 360 PRP changes with room for fill in if needed Attached, 360 DBH           IMAGING AND PROCEDURES  Imaging and Procedures for 02/23/2023  OCT, Retina - OU - Both Eyes       Right Eye Quality was good. Central Foveal Thickness: 247. Progression has worsened. Findings include no SRF, abnormal foveal contour, intraretinal hyper-reflective material, intraretinal fluid, vitreomacular adhesion (Persistent IRF greatest nasal and central macula -- minimal improvement, partial PVD).   Left Eye Quality was good. Central Foveal Thickness: 199. Progression has improved.  Findings include normal foveal contour, no SRF, intraretinal hyper-reflective material, intraretinal fluid, vitreomacular adhesion (persistent IRF/cystic changes temporal fovea and macula -- slightly improved, patchy ORA).   Notes *Images captured and stored on drive  Diagnosis / Impression:  +DME OU OD: Persistent IRF greatest nasal and central macula -- minimal improvement, partial PVD OS: persistent IRF/cystic changes temporal fovea and macula -- slightly improved, patchy ORA  Clinical management:  See below  Abbreviations: NFP - Normal foveal profile. CME - cystoid macular edema. PED - pigment epithelial detachment. IRF - intraretinal fluid. SRF - subretinal fluid. EZ - ellipsoid zone. ERM - epiretinal membrane. ORA - outer retinal atrophy. ORT - outer retinal tubulation. SRHM - subretinal hyper-reflective material. IRHM - intraretinal hyper-reflective material      Intravitreal Injection, Pharmacologic Agent - OD - Right Eye       Time Out 02/23/2023. 2:00 PM. Confirmed correct patient, procedure, site, and patient consented.   Anesthesia Topical anesthesia was used. Anesthetic medications included Lidocaine 2%, Proparacaine 0.5%.   Procedure Preparation included 5% betadine to ocular surface, eyelid speculum. A (32g) needle was used.   Injection: 2 mg aflibercept 2 MG/0.05ML   Route: Intravitreal, Site: Right Eye   NDC: L6038910, Lot: [id number], Expiration date: 01/30/2024, Waste: 0 mL   Post-op Post injection exam found visual acuity of at least counting fingers. The patient tolerated the procedure well. There were no complications. The patient received written and verbal post procedure care education. Post injection medications were not given.      Intravitreal Injection, Pharmacologic Agent - OS - Left Eye       Time Out 02/23/2023. 2:01 PM. Confirmed correct patient, procedure, site, and patient consented.  Anesthesia Topical anesthesia was used. Anesthetic  medications included Lidocaine 2%, Proparacaine 0.5%.   Procedure Preparation included 5% betadine to ocular surface, eyelid speculum. A (32g) needle was used.   Injection: 2 mg aflibercept 2 MG/0.05ML   Route: Intravitreal, Site: Left Eye   NDC: L6038910, Lot: [id number], Expiration date: 02/29/2024, Waste: 0 mL   Post-op Post injection exam found visual acuity of at least counting fingers. The patient tolerated the procedure well. There were no complications. The patient received written and verbal post procedure care education. Post injection medications were not given.            ASSESSMENT/PLAN:    ICD-10-CM   1. Proliferative diabetic retinopathy of right eye with macular edema associated with type 2 diabetes mellitus (HCC)  E11.3511 OCT, Retina - OU - Both Eyes    Intravitreal Injection, Pharmacologic Agent - OD - Right Eye    aflibercept (EYLEA) SOLN 2 mg    2. Severe nonproliferative diabetic retinopathy of left eye with macular edema associated with type 2 diabetes mellitus (HCC)  W29.5621 Intravitreal Injection, Pharmacologic Agent - OS - Left Eye    aflibercept (EYLEA) SOLN 2 mg    3. Current use of insulin (HCC)  Z79.4     4. Long term (current) use of oral hypoglycemic drugs  Z79.84     5. Essential hypertension  I10     6. Hypertensive retinopathy of both eyes  H35.033     7. Combined forms of age-related cataract of both eyes  H25.813      1-4. Proliferative diabetic retinopathy OD  - severe NPDR OS - delayed to follow up from 10.28.22 to 01.04.23 (9+ wks) - s/p IVA OU #1 (06.06.22), #2 (07.08.22), #3 (08.05.22), #4 (09.02.22), #5 (09.30.22), #6 (10.28.22), #7 (01.04.23), #8 (02.20.33), #9 (04.03.23), #10 (05.15.23), #11 (06.28.23), #12 (08.16.23), #13 (10.10.23), #14 (11.14.23), #15 (01.15.24), #16 (02.26.24) , #17 (04.03.24), #18 (05.20.24) **h/o increased IRF/DME OU at 7 wks, noted on 08.16.23**  - s/p PRP OD (06.10.22) - exam shows central edema OU,  scattered IRH -- improving - FA (06.06.22) shows OD: focal NVE IN arcades; OS: no NV OS; late leaking MA's OU -- s/p PRP OD 6.10.22 - BCVA -- OD 20/40 (stable); OS 20/60 from 20/50 - OCT shows OD: Persistent IRF greatest nasal and central macula -- minimal improvement, partial PVD; OS: persistent IRF/cystic changes temporal fovea and macula -- slightly improved, patchy ORA **discussed decreased efficacy / resistance to Avastin and potential benefit of switching medication**  - recommend IVE OU #1 today, 06.24.24 w/ f/u in 5 wks - pt wishes to proceed - RBA of procedure discussed, questions answered - IVE informed consent obtained and signed (OU), 06.24.24 - see procedure note - Eylea approved for 2024 - f/u 5 weeks, DFE/OCT, possible injection(s)  5,6. Hypertensive retinopathy OU - discussed importance of tight BP control - monitor  7. Mixed Cataract OU - The symptoms of cataract, surgical options, and treatments and risks were discussed with patient. - discussed diagnosis and progression - clear from a retina standpoint to proceed with cataract surgery when both patient and surgeon are ready - pt had surgery scheduled on April 3rd with Dr. Zenaida Niece -- surgery was canceled due to pt not having anyone to drive him, pt has not rescheduled appt yet  Ophthalmic Meds Ordered this visit:  Meds ordered this encounter  Medications   aflibercept (EYLEA) SOLN 2 mg   aflibercept (EYLEA) SOLN 2 mg     Return  in about 5 weeks (around 03/30/2023) for f/u PDR OU, DFE, OCT.  There are no Patient Instructions on file for this visit.  This document serves as a record of services personally performed by Karie Chimera, MD, PhD. It was created on their behalf by De Blanch, an ophthalmic technician. The creation of this record is the provider's dictation and/or activities during the visit.    Electronically signed by: De Blanch, OA, 02/23/23  4:18 PM  This document serves as a record of  services personally performed by Karie Chimera, MD, PhD. It was created on their behalf by Glee Arvin. Manson Passey, OA an ophthalmic technician. The creation of this record is the provider's dictation and/or activities during the visit.    Electronically signed by: Glee Arvin. Manson Passey, [place] 06.24.2024 4:18 PM  Karie Chimera, M.D., Ph.D. Diseases & Surgery of the Retina and Vitreous [facility]  I have reviewed the above documentation for accuracy and completeness, and I agree with the above. Karie Chimera, M.D., Ph.D. 02/23/23 4:20 PM   Abbreviations: M myopia (nearsighted); A astigmatism; H hyperopia (farsighted); P presbyopia; Mrx spectacle prescription;  CTL contact lenses; OD right eye; OS left eye; OU both eyes  XT exotropia; ET esotropia; PEK punctate epithelial keratitis; PEE punctate epithelial erosions; DES dry eye syndrome; MGD meibomian gland dysfunction; ATs artificial tears; PFAT's preservative free artificial tears; NSC nuclear sclerotic cataract; PSC posterior subcapsular cataract; ERM epi-retinal membrane; PVD posterior vitreous detachment; RD retinal detachment; DM diabetes mellitus; DR diabetic retinopathy; NPDR non-proliferative diabetic retinopathy; PDR proliferative diabetic retinopathy; CSME clinically significant macular edema; DME diabetic macular edema; dbh dot blot hemorrhages; CWS cotton wool spot; POAG primary open angle glaucoma; C/D cup-to-disc ratio; HVF humphrey visual field; GVF goldmann visual field; OCT optical coherence tomography; IOP intraocular pressure; BRVO Branch retinal vein occlusion; CRVO central retinal vein occlusion; CRAO central retinal artery occlusion; BRAO branch retinal artery occlusion; RT retinal tear; SB scleral buckle; PPV pars plana vitrectomy; VH Vitreous hemorrhage; PRP panretinal laser photocoagulation; IVK intravitreal kenalog; VMT vitreomacular traction; MH Macular hole;  NVD neovascularization of the disc; NVE  neovascularization elsewhere; AREDS age related eye disease study; ARMD age related macular degeneration; POAG primary open angle glaucoma; EBMD epithelial/anterior basement membrane dystrophy; ACIOL anterior chamber intraocular lens; IOL intraocular lens; PCIOL posterior chamber intraocular lens; Phaco/IOL phacoemulsification with intraocular lens placement; PRK photorefractive keratectomy; LASIK laser assisted in situ keratomileusis; HTN hypertension; DM diabetes mellitus; COPD chronic obstructive pulmonary disease

## 2023-02-12 NOTE — Telephone Encounter (Signed)
Patient never returned call, sent mychart message if still has question to call the office

## 2023-02-23 ENCOUNTER — Ambulatory Visit (INDEPENDENT_AMBULATORY_CARE_PROVIDER_SITE_OTHER): Payer: 59 | Admitting: Ophthalmology

## 2023-02-23 ENCOUNTER — Encounter (INDEPENDENT_AMBULATORY_CARE_PROVIDER_SITE_OTHER): Payer: Self-pay | Admitting: Ophthalmology

## 2023-02-23 DIAGNOSIS — H25813 Combined forms of age-related cataract, bilateral: Secondary | ICD-10-CM

## 2023-02-23 DIAGNOSIS — E113511 Type 2 diabetes mellitus with proliferative diabetic retinopathy with macular edema, right eye: Secondary | ICD-10-CM

## 2023-02-23 DIAGNOSIS — Z794 Long term (current) use of insulin: Secondary | ICD-10-CM

## 2023-02-23 DIAGNOSIS — I10 Essential (primary) hypertension: Secondary | ICD-10-CM | POA: Diagnosis not present

## 2023-02-23 DIAGNOSIS — Z7984 Long term (current) use of oral hypoglycemic drugs: Secondary | ICD-10-CM

## 2023-02-23 DIAGNOSIS — E113412 Type 2 diabetes mellitus with severe nonproliferative diabetic retinopathy with macular edema, left eye: Secondary | ICD-10-CM | POA: Diagnosis not present

## 2023-02-23 DIAGNOSIS — H35033 Hypertensive retinopathy, bilateral: Secondary | ICD-10-CM | POA: Diagnosis not present

## 2023-02-23 MED ORDER — AFLIBERCEPT 2MG/0.05ML IZ SOLN FOR KALEIDOSCOPE
2.0000 mg | INTRAVITREAL | Status: AC | PRN
Start: 2023-02-23 — End: 2023-02-23
  Administered 2023-02-23: 2 mg via INTRAVITREAL

## 2023-03-12 ENCOUNTER — Encounter (HOSPITAL_BASED_OUTPATIENT_CLINIC_OR_DEPARTMENT_OTHER): Payer: Self-pay | Admitting: Family

## 2023-03-12 ENCOUNTER — Ambulatory Visit (INDEPENDENT_AMBULATORY_CARE_PROVIDER_SITE_OTHER): Payer: 59 | Admitting: Family

## 2023-03-12 VITALS — BP 126/66 | HR 56 | Ht 69.5 in | Wt 143.4 lb

## 2023-03-12 DIAGNOSIS — E785 Hyperlipidemia, unspecified: Secondary | ICD-10-CM | POA: Diagnosis not present

## 2023-03-12 DIAGNOSIS — I5042 Chronic combined systolic (congestive) and diastolic (congestive) heart failure: Secondary | ICD-10-CM | POA: Diagnosis not present

## 2023-03-12 DIAGNOSIS — I10 Essential (primary) hypertension: Secondary | ICD-10-CM | POA: Diagnosis not present

## 2023-03-12 DIAGNOSIS — I25118 Atherosclerotic heart disease of native coronary artery with other forms of angina pectoris: Secondary | ICD-10-CM

## 2023-03-12 NOTE — Progress Notes (Signed)
Cardiology Office Note:  .   Date:  03/12/2023  ID:  [name], DOB 1950/11/17, MRN [id number] PCP: Melvenia Beam, MD  [facility] Providers Cardiologist:  Chilton Si, MD    History of Present Illness: .   Earl Calderon is a 72 y.o. male with a history of chronic systolic and diastolic heart failure, prior CVA, diabetes, hypertension, lipidemia, CAD s/p PCI and non-adherence.  He was last seen 12/29/2022 by Dr. Duke Salvia.  Prior PYP with no amyloidosis. 10/2018 DES-mLAD lesion. Admitted 07/2020 with acute on chronic systolic and diastolic failure in setting of not taking his medications as he reported wanting to "remove all chemicals from body". LVEF as been as low as LVEF 25-30% 10/2018 with most recent echocardiogram 12/2022 LVEF 45-50%.   Last seen 12/29/22 noting increased fatigue. Carvedilol was reduced to 6.25mg  BID. Valla Leaver, and Spironolactone were continued.   He presents today for follow up independently. He has been active working up to clean up his yard. Ambulates with a cane at baseline. He notes his energy level is mildly improved on reduced dose of Carvedilol. Still feels tired after working on house projects. Wakes up feeling well but then tired out easily. Notes he cannot recall the last time he felt he had "good stamina".   Reports no shortness of breath nor dyspnea on exertion. Reports no chest pain, pressure, or tightness. No  orthopnea, PND. Reports no palpitations.  Notes his feet will swell if he is on his feet for long periods of time. Overall swelling is better than previous.   ROS: Please see the history of present illness.    All other systems reviewed and are negative.   Studies Reviewed: .        Cardiac Studies & Procedures   CARDIAC CATHETERIZATION  CARDIAC CATHETERIZATION 07/18/2020  Narrative Successful right heart catheterization via the right antecubital vein.  Mildly elevated filling pressures, mild to  moderate pulmonary hypertension and mildly reduced cardiac output.  RA: 8 mmHg RV: 53/3 mmHg PCW 15 mmHg PA: 50/17 mmHg with a mean of 29  AO sat 97% PA sat 60% Cardiac output: 3.92 with a cardiac index of 2.15.  Recommendations: Hemodynamics appear reasonable.  Continue oral furosemide and heart failure medications.   CARDIAC CATHETERIZATION  CARDIAC CATHETERIZATION 11/15/2018  Narrative  Prox LAD to Mid LAD lesion is 40% stenosed.  Mid Cx to Dist Cx lesion is 25% stenosed.  Ost 2nd Diag to 2nd Diag lesion is 50% stenosed.  Ost 3rd Mrg lesion is 25% stenosed.  Mid LAD lesion is 90% stenosed.  A drug-eluting stent was successfully placed using a STENT SYNERGY DES 2.5X16.  Post intervention, there is a 0% residual stenosis.  LV end diastolic pressure is normal.  There is no aortic valve stenosis.  Ao 92%, PA 66%, mean PA 17 mm Hg, mean PCWP 3 mm Hg, CO 5.1; CI 2.8- normal right heart pressures  Tortuous right subclavian. 3DRC was used for RCA.  Continue aggressive secondary prevention in addition to dual antiplatelet therapy.  Adequate diuresis based on right heart pressures.  Continue medical therapy for heart failure.  Findings Coronary Findings Diagnostic  Dominance: Right  Left Anterior Descending Prox LAD to Mid LAD lesion is 40% stenosed. The lesion is calcified. Mid LAD lesion is 90% stenosed.  Second Diagonal [employer] 2nd Diag to 2nd Diag lesion is 50% stenosed.  Left Circumflex Mid Cx to Dist Cx lesion is 25% stenosed.  Third Obtuse Marginal [employer]  3rd Mrg lesion is 25% stenosed.  Right Coronary Artery Vessel is small. The vessel exhibits minimal luminal irregularities.  Intervention  Mid LAD lesion Stent CATHETER LAUNCHER 6FREBU 3.5 guide catheter was inserted. Lesion crossed with guidewire using a WIRE ASAHI PROWATER 180CM. Pre-stent angioplasty was performed using a BALLOON SAPPHIRE 2.0X15. A drug-eluting stent was successfully  placed using a STENT SYNERGY DES 2.5X16. Stent strut is well apposed. Post-stent angioplasty was performed using a BALLOON SAPPHIRE [place] [id number]. Post-Intervention Lesion Assessment The intervention was successful. Pre-interventional TIMI flow is 3. Post-intervention TIMI flow is 3. No complications occurred at this lesion. There is a 0% residual stenosis post intervention.     ECHOCARDIOGRAM  ECHOCARDIOGRAM COMPLETE 01/29/2023  Narrative ECHOCARDIOGRAM REPORT    Patient Name:   Earl Calderon Date of Exam: 01/29/2023 Medical Rec #:  [id number]         Height:       69.5 in Accession #:    [id number]        Weight:       145.7 lb Date of Birth:  1951/03/31         BSA:          1.815 m Patient Age:    64 years          BP:           160/75 mmHg Patient Gender: M                 HR:           67 bpm. Exam Location:  Outpatient  Procedure: 2D Echo, 3D Echo, Cardiac Doppler, Color Doppler and Strain Analysis  Indications:    CHF  History:        Patient has prior history of Echocardiogram examinations, most recent 05/27/2021. CAD, Arrythmias:LBBB, Signs/Symptoms:Dyspnea; Risk Factors:Dyslipidemia, Diabetes, Hypertension and Non-Smoker. NSVT.  Sonographer:    Jeryl Columbia RDCS Referring Phys: [id number] TIFFANY Custer  IMPRESSIONS   1. Left ventricular ejection fraction, by estimation, is 45 to 50%. Left ventricular ejection fraction by 3D volume is 49 %. The left ventricle has mildly decreased function. The left ventricle has no regional wall motion abnormalities. There is moderate left ventricular hypertrophy. Left ventricular diastolic parameters are consistent with Grade I diastolic dysfunction (impaired relaxation). 2. Right ventricular systolic function is normal. The right ventricular size is normal. There is normal pulmonary artery systolic pressure. The estimated right ventricular systolic pressure is 24.9 mmHg. 3. Left atrial size was mildly dilated. 4. The mitral valve  is normal in structure. No evidence of mitral valve regurgitation. No evidence of mitral stenosis. 5. The aortic valve is tricuspid. Aortic valve regurgitation is not visualized. No aortic stenosis is present. 6. Aortic dilatation noted. There is mild dilatation of the aortic root, measuring 41 mm. 7. The inferior vena cava is normal in size with greater than 50% respiratory variability, suggesting right atrial pressure of 3 mmHg.  Comparison(s): Prior images reviewed side by side. ECHO 2022: EF 40-45%.  FINDINGS Left Ventricle: Left ventricular ejection fraction, by estimation, is 45 to 50%. Left ventricular ejection fraction by 3D volume is 49 %. The left ventricle has mildly decreased function. The left ventricle has no regional wall motion abnormalities. The left ventricular internal cavity size was normal in size. There is moderate left ventricular hypertrophy. Abnormal (paradoxical) septal motion, consistent with left bundle branch block. Left ventricular diastolic parameters are consistent with Grade I diastolic dysfunction (impaired relaxation).  Right Ventricle: The right ventricular  size is normal. No increase in right ventricular wall thickness. Right ventricular systolic function is normal. There is normal pulmonary artery systolic pressure. The tricuspid regurgitant velocity is 2.34 m/s, and with an assumed right atrial pressure of 3 mmHg, the estimated right ventricular systolic pressure is 24.9 mmHg.  Left Atrium: Left atrial size was mildly dilated.  Right Atrium: Right atrial size was normal in size.  Pericardium: There is no evidence of pericardial effusion.  Mitral Valve: The mitral valve is normal in structure. No evidence of mitral valve regurgitation. No evidence of mitral valve stenosis.  Tricuspid Valve: The tricuspid valve is normal in structure. Tricuspid valve regurgitation is trivial. No evidence of tricuspid stenosis.  Aortic Valve: The aortic valve is tricuspid.  Aortic valve regurgitation is not visualized. No aortic stenosis is present.  Pulmonic Valve: The pulmonic valve was normal in structure. Pulmonic valve regurgitation is not visualized. No evidence of pulmonic stenosis.  Aorta: The aortic root is normal in size and structure and aortic dilatation noted. There is mild dilatation of the aortic root, measuring 41 mm.  Venous: The inferior vena cava is normal in size with greater than 50% respiratory variability, suggesting right atrial pressure of 3 mmHg.  IAS/Shunts: No atrial level shunt detected by color flow Doppler.   LEFT VENTRICLE PLAX 2D LVIDd:         4.42 cm         Diastology LVIDs:         3.37 cm         LV e' medial:    3.48 cm/s LV PW:         1.52 cm         LV E/e' medial:  19.4 LV IVS:        1.19 cm         LV e' lateral:   5.77 cm/s LVOT diam:     1.90 cm         LV E/e' lateral: 11.7 LV SV:         54 LV SV Index:   30 LVOT Area:     2.84 cm        3D Volume EF LV 3D EF:    Left ventricul ar ejection fraction by 3D volume is 49 %.  3D Volume EF: 3D EF:        49 % LV EDV:       175 ml LV ESV:       89 ml LV SV:        86 ml  RIGHT VENTRICLE RV Basal diam:  3.35 cm RV Mid diam:    2.87 cm RV S prime:     12.70 cm/s TAPSE (M-mode): 2.3 cm  LEFT ATRIUM             Index        RIGHT ATRIUM           Index LA diam:        4.50 cm 2.48 cm/m   RA Area:     10.60 cm LA Vol (A2C):   93.3 ml 51.41 ml/m  RA Volume:   23.10 ml  12.73 ml/m LA Vol (A4C):   30.3 ml 16.70 ml/m LA Biplane Vol: 58.4 ml 32.18 ml/m AORTIC VALVE LVOT Vmax:   82.30 cm/s LVOT Vmean:  53.100 cm/s LVOT VTI:    0.192 m  AORTA Ao Root diam: 4.10 cm Ao Asc diam:  3.20 cm  MITRAL VALVE                TRICUSPID VALVE MV Area (PHT): 4.21 cm     TR Peak grad:   21.9 mmHg MV Decel Time: 180 msec     TR Vmax:        234.00 cm/s MV E velocity: 67.40 cm/s MV A velocity: 113.00 cm/s  SHUNTS MV E/A ratio:  0.60         Systemic VTI:   0.19 m Systemic Diam: 1.90 cm  Donato Schultz MD Electronically signed by Donato Schultz MD Signature Date/Time: 01/29/2023/2:04:42 PM    Final             Risk Assessment/Calculations:             Physical Exam:   VS:  BP 126/66   Pulse (!) 56   Ht 5' 9.5" (1.765 m)   Wt 143 lb 6.4 oz (65 kg)   BMI 20.87 kg/m    Wt Readings from Last 3 Encounters:  03/12/23 143 lb 6.4 oz (65 kg)  12/29/22 145 lb 11.2 oz (66.1 kg)  12/25/22 146 lb 12 oz (66.6 kg)    GEN: Thin, well developed in no acute distress NECK: No JVD; No carotid bruits CARDIAC: RRR, no murmurs, rubs, gallops RESPIRATORY:  Clear to auscultation without rales, wheezing or rhonchi  ABDOMEN: Soft, non-tender, non-distended EXTREMITIES:  No edema; No deformity   ASSESSMENT AND PLAN: .    HFrEF - Euvolemic and well compensated on exam. GDMT carvedilol 6.25mg  BID, entresto 97-103mg  BID, Jardiance 10mg  every day, Hydralazine 25mg  BID, lasix 40mg  every day, Spironolactone 25mg  every day. Low sodium diet, fluid restriction <2L, and daily weights encouraged. Educated to contact our office for weight gain of 2 lbs overnight or 5 lbs in one week.  Discussed possible further reduction of Carvedilol due to fatigue but he prefers to continue current regimen.  Discussed possible CCM which he will consider  CAD - Stable with no anginal symptoms. No indication for ischemic evaluation.  GDMT aspirin, carvedilol, rosuvastatin, PRN nitroglycerin. Heart healthy diet and regular cardiovascular exercise encouraged.    HTN - BP well controlled. Continue current antihypertensive regimen.    HLD, LDL goal <70 - Continue Rosuvastatin 40mg  daily.        Dispo: follow up in 4 months with Dr. Duke Salvia or APP  Signed, Alver Sorrow, NP

## 2023-03-12 NOTE — Patient Instructions (Signed)
Medication Instructions:  Your physician recommends that you continue on your current medications as directed. Please refer to the Current Medication list given to you today.  *If you need a refill on your cardiac medications before your next appointment, please call your pharmacy*  Follow-Up: At [facility], you and your health needs are our priority.  As part of our continuing mission to provide you with exceptional heart care, we have created designated Provider Care Teams.  These Care Teams include your primary Cardiologist (physician) and Advanced Practice Providers (APPs -  Physician Assistants and Nurse Practitioners) who all work together to provide you with the care you need, when you need it.  We recommend signing up for the patient portal called "MyChart".  Sign up information is provided on this After Visit Summary.  MyChart is used to connect with patients for Virtual Visits (Telemedicine).  Patients are able to view lab/test results, encounter notes, upcoming appointments, etc.  Non-urgent messages can be sent to your provider as well.   To learn more about what you can do with MyChart, go to [email or web address].    Your next appointment:   4 months with Dr. Duke Salvia or Gillian Shields, NP

## 2023-03-24 NOTE — Progress Notes (Signed)
[facility] - Clinic Note  03/30/2023    CHIEF COMPLAINT Patient presents for Retina Follow Up  HISTORY OF PRESENT ILLNESS: Earl Calderon is a 72 y.o. male who presents to the clinic today for:   HPI     Retina Follow Up   Patient presents with  Diabetic Retinopathy.  In both eyes.  This started months ago.  Duration of 5 weeks.  Since onset it is stable.  I, the attending physician,  performed the HPI with the patient and updated documentation appropriately.        Comments   Patient states that he is beginning to see floaters. He is not using eye drops. He is unsure of the blood sugar.       Last edited by Rennis Chris, MD on 03/30/2023  5:01 PM.    Patient states that yesterday both eyes started seeing floaters.    Referring physician: Olivia Canter, MD [address] [place],  [place] [address]  HISTORICAL INFORMATION:   Selected notes from the MEDICAL RECORD NUMBER Referred by Dr. Laruth Bouchard for severe NPDR OU   CURRENT MEDICATIONS: No current outpatient medications on file. (Ophthalmic Drugs)   No current facility-administered medications for this visit. (Ophthalmic Drugs)   Current Outpatient Medications (Other)  Medication Sig   APPLE CIDER VINEGAR PO Take 1 tablet by mouth daily at 6 (six) AM.   aspirin EC (ASPIRIN LOW DOSE) 81 MG tablet Take 1 tablet (81 mg total) by mouth daily. Swallow whole.   carvedilol (COREG) 6.25 MG tablet Take 1 tablet (6.25 mg total) by mouth 2 (two) times daily with a meal.   ELDERBERRY PO Take 100 mg by mouth daily.   ferrous sulfate 325 (65 FE) MG tablet Take 1 tablet (325 mg total) by mouth daily with breakfast.   furosemide (LASIX) 40 MG tablet TAKE 1 TABLET BY MOUTH ONCE DAILY   glucose blood (FREESTYLE TEST STRIPS) test strip Use as instructed   HUMALOG KWIKPEN 100 UNIT/ML KwikPen Inject into the skin. Sliding Scale   hydrALAZINE (APRESOLINE) 25 MG tablet TAKE 1 TABLET BY MOUTH IN THE MORNING,  AND TAKE 1 TABLET AT BEDTIME   Insulin Pen Needle (NOVOFINE PEN NEEDLE) 32G X 6 MM MISC USE AS DIRECTED   JARDIANCE 10 MG TABS tablet TAKE ONE TABLET BY MOUTH DAILY BEFORE BREAKFAST   LEVEMIR FLEXTOUCH 100 UNIT/ML FlexPen Inject 8 Units into the skin 2 (two) times daily.   nitroGLYCERIN (NITROSTAT) 0.4 MG SL tablet Place 1 tablet (0.4 mg total) under the tongue every 5 (five) minutes as needed for chest pain.   Probiotic Product (PROBIOTIC MULTI-ENZYME) TABS Take 3 tablets by mouth daily at 6 (six) AM.   rosuvastatin (CRESTOR) 40 MG tablet TAKE 1 TABLET BY MOUTH ONCE DAILY AT 6PM   sacubitril-valsartan (ENTRESTO) 97-103 MG TAKE ONE TABLET BY MOUTH TWO TIMES DAILY   spironolactone (ALDACTONE) 25 MG tablet TAKE 1 TABLET BY MOUTH ONCE DAILY   Turmeric 500 MG TABS Take 2 tablets by mouth daily at 6 (six) AM.   No current facility-administered medications for this visit. (Other)   REVIEW OF SYSTEMS: ROS   Positive for: Endocrine, Cardiovascular, Eyes Negative for: Constitutional, Gastrointestinal, Neurological, Skin, Genitourinary, Musculoskeletal, HENT, Respiratory, Psychiatric, Allergic/Imm, Heme/Lymph Last edited by Charlette Caffey, COT on 03/30/2023  1:00 PM.      ALLERGIES No Known Allergies  PAST MEDICAL HISTORY Past Medical History:  Diagnosis Date   CAD in  native artery 01/01/2021   Prior LAD PCI.  Currently no symptoms of ischemia.  Encouraged him to do some light exercising.  Continue aspirin, carvedilol, and rosuvastatin.   Cataract    CKD (chronic kidney disease), stage III (HCC) 08/24/2020   Coronary artery disease    Diabetes mellitus without complication (HCC)    on meds   Diabetic retinopathy (HCC)    Goals of care, counseling/discussion 07/01/2021   Hernia, inguinal    Hypertension    on meds   Hypertensive retinopathy    Lymphadenopathy, cervical 07/01/2021   MGUS (monoclonal gammopathy of unknown significance) 07/01/2021   NSVT (nonsustained ventricular  tachycardia) (HCC) 08/24/2020   Pure hypercholesterolemia 08/05/2021   Resistant hypertension 11/11/2018   Uncontrolled type 2 diabetes mellitus 11/11/2018   Past Surgical History:  Procedure Laterality Date   CORONARY STENT INTERVENTION N/A 11/15/2018   Procedure: CORONARY STENT INTERVENTION;  Surgeon: Corky Crafts, MD;  Location: [facility] INVASIVE CV LAB;  Service: Cardiovascular;  Laterality: N/A;   HEMORROIDECTOMY     RIGHT HEART CATH N/A 07/18/2020   Procedure: RIGHT HEART CATH;  Surgeon: Iran Ouch, MD;  Location: [facility] INVASIVE CV LAB;  Service: Cardiovascular;  Laterality: N/A;   RIGHT/LEFT HEART CATH AND CORONARY ANGIOGRAPHY N/A 11/15/2018   Procedure: RIGHT/LEFT HEART CATH AND CORONARY ANGIOGRAPHY;  Surgeon: Corky Crafts, MD;  Location: [facility] INVAS[facility] CV LAB;  Service: Cardiovascular;  Laterality: N/A;   WISDOM TOOTH EXTRACTION     FAMILY HISTORY Family History  Problem Relation Age of Onset   Hypertension Mother    Diabetes Mellitus II Mother    Arrhythmia Mother    Heart disease Mother    Hypertension Sister    Hypertension Brother    Heart disease Brother    Kidney disease Brother    Hypertension Other    Diabetes Mellitus II Other        All 9 sibblings have HTN   Colon cancer Neg Hx    Colon polyps Neg Hx    Esophageal cancer Neg Hx    Stomach cancer Neg Hx    Rectal cancer Neg Hx    SOCIAL HISTORY Social History   Tobacco Use   Smoking status: Never   Smokeless tobacco: Never  Vaping Use   Vaping status: Never Used  Substance Use Topics   Alcohol use: Not Currently   Drug use: Never       OPHTHALMIC EXAM:  Base Eye Exam     Visual Acuity (Snellen - Linear)       Right Left   Dist [place] 20/40 20/60   Dist ph [place] NI NI         Tonometry (Tonopen, 1:02 PM)       Right Left   Pressure 9 12         Pupils       Dark Light Shape React APD   Right 3 2 Round Brisk None   Left 3 2 Round Brisk None         Visual Fields        Left Right   Restrictions Partial outer superior temporal deficiency Partial outer superior nasal deficiency         Extraocular Movement       Right Left    Full, Ortho Full, Ortho         Neuro/Psych     Oriented x3: Yes   Mood/Affect: Normal         Dilation  Both eyes: 1.0% Mydriacyl, 2.5% Phenylephrine @ 1:00 PM           Slit Lamp and Fundus Exam     Slit Lamp Exam       Right Left   Lids/Lashes Dermatochalasis - upper lid Dermatochalasis - upper lid   Conjunctiva/Sclera Melanosis Melanosis   Cornea arcus, fine endo pigment arcus   Anterior Chamber deep and clear, no cell or flare deep and clear   Iris Round and dilated, No NVI Round and dilated, No NVI   Lens 2-3+ Nuclear sclerosis, 2-3+ Cortical cataract 2-3+ Nuclear sclerosis, 2-3+ Cortical cataract   Anterior Vitreous Vitreous syneresis Vitreous syneresis         Fundus Exam       Right Left   Disc Pink and Sharp, no NVD, +PPP Pink and Sharp, no NVD, +PPP   C/D Ratio 0.6 0.4   Macula good foveal reflex, central edema -- persistent, scattered MA/DBH - improved, focal exudates temporal macula -- improved good foveal reflex, scattered MA/DBH, +cystic changes temporal macula -- slightly improved, no exudates or CWS   Vessels attenuated, tortuous, focal NV IN arcades -- regressing, mild copper wiring attenuated, Tortuous   Periphery Attached, 360 MA/DBH greatest posteriorly, light 360 PRP changes with room for fill in if needed Attached, 360 DBH           IMAGING AND PROCEDURES  Imaging and Procedures for 03/30/2023  OCT, Retina - OU - Both Eyes       Right Eye Quality was good. Central Foveal Thickness: 248. Progression has improved. Findings include no SRF, abnormal foveal contour, intraretinal hyper-reflective material, intraretinal fluid, vitreomacular adhesion (Interval improvement in IRF greatest nasal and central macula, partial PVD).   Left Eye Quality was good. Central  Foveal Thickness: 210. Progression has improved. Findings include normal foveal contour, no SRF, intraretinal hyper-reflective material, intraretinal fluid, vitreomacular adhesion (persistent IRF/cystic changes temporal fovea and macula -- slightly improved, patchy ORA).   Notes *Images captured and stored on drive  Diagnosis / Impression:  +DME OU OD: Interval improvement in IRF greatest nasal and central macula, partial PVD OS: persistent IRF/cystic changes temporal fovea and macula -- slightly improved, patchy ORA  Clinical management:  See below  Abbreviations: NFP - Normal foveal profile. CME - cystoid macular edema. PED - pigment epithelial detachment. IRF - intraretinal fluid. SRF - subretinal fluid. EZ - ellipsoid zone. ERM - epiretinal membrane. ORA - outer retinal atrophy. ORT - outer retinal tubulation. SRHM - subretinal hyper-reflective material. IRHM - intraretinal hyper-reflective material      Intravitreal Injection, Pharmacologic Agent - OD - Right Eye       Time Out 03/30/2023. 1:05 PM. Confirmed correct patient, procedure, site, and patient consented.   Anesthesia Topical anesthesia was used. Anesthetic medications included Lidocaine 2%, Proparacaine 0.5%.   Procedure Preparation included 5% betadine to ocular surface, eyelid speculum. A (32g) needle was used.   Injection: 2 mg aflibercept 2 MG/0.05ML   Route: Intravitreal, Site: Right Eye   NDC: L6038910, Lot: [id number], Expiration date: 05/01/2024, Waste: 0 mL   Post-op Post injection exam found visual acuity of at least counting fingers. The patient tolerated the procedure well. There were no complications. The patient received written and verbal post procedure care education. Post injection medications were not given.      Intravitreal Injection, Pharmacologic Agent - OS - Left Eye       Time Out 03/30/2023. 1:05 PM. Confirmed correct patient, procedure, site, and patient  consented.    Anesthesia Topical anesthesia was used. Anesthetic medications included Lidocaine 2%, Proparacaine 0.5%.   Procedure Preparation included 5% betadine to ocular surface, eyelid speculum. A (32g) needle was used.   Injection: 2 mg aflibercept 2 MG/0.05ML   Route: Intravitreal, Site: Left Eye   NDC: L6038910, Lot: [id number], Expiration date: 05/01/2024, Waste: 0 mL   Post-op Post injection exam found visual acuity of at least counting fingers. The patient tolerated the procedure well. There were no complications. The patient received written and verbal post procedure care education. Post injection medications were not given.            ASSESSMENT/PLAN:    ICD-10-CM   1. Proliferative diabetic retinopathy of right eye with macular edema associated with type 2 diabetes mellitus (HCC)  E11.3511 OCT, Retina - OU - Both Eyes    Intravitreal Injection, Pharmacologic Agent - OD - Right Eye    aflibercept (EYLEA) SOLN 2 mg    2. Severe nonproliferative diabetic retinopathy of left eye with macular edema associated with type 2 diabetes mellitus (HCC)  Y40.3474 Intravitreal Injection, Pharmacologic Agent - OS - Left Eye    aflibercept (EYLEA) SOLN 2 mg    3. Current use of insulin (HCC)  Z79.4     4. Long term (current) use of oral hypoglycemic drugs  Z79.84     5. Essential hypertension  I10     6. Hypertensive retinopathy of both eyes  H35.033     7. Combined forms of age-related cataract of both eyes  H25.813      1-4. Proliferative diabetic retinopathy OD  - severe NPDR OS - delayed to follow up from 10.28.22 to 01.04.23 (9+ wks) - s/p IVA OU #1 (06.06.22), #2 (07.08.22), #3 (08.05.22), #4 (09.02.22), #5 (09.30.22), #6 (10.28.22), #7 (01.04.23), #8 (02.20.33), #9 (04.03.23), #10 (05.15.23), #11 (06.28.23), #12 (08.16.23), #13 (10.10.23), #14 (11.14.23), #15 (01.15.24), #16 (02.26.24) , #17 (04.03.24), #18 (05.20.24) -- IVA resistance - s/p IVE OU #1 (06.24.24) **h/o  increased IRF/DME OU at 7 wks, noted on 08.16.23**  - s/p PRP OD (06.10.22) - exam shows central edema OU, scattered IRH -- improving - FA (06.06.22) shows OD: focal NVE IN arcades; OS: no NV OS; late leaking MA's OU -- s/p PRP OD 6.10.22 - BCVA -- OD 20/40 (stable); OS 20/60 from 20/50 - OCT shows OD: Interval improvement in IRF greatest nasal and central macula; OS: persistent IRF/cystic changes temporal fovea and macula -- slightly improved, patchy ORA at 5 wks - recommend IVE OU #2 today, 07.29.24 w/ f/u in 5 wks - pt wishes to proceed - RBA of procedure discussed, questions answered - IVE informed consent obtained and signed (OU), 06.24.24 - see procedure note - Eylea approved for 2024 - f/u 5 weeks, DFE/OCT, possible injection(s)  5,6. Hypertensive retinopathy OU - discussed importance of tight BP control - monitor  7. Mixed Cataract OU - The symptoms of cataract, surgical options, and treatments and risks were discussed with patient. - discussed diagnosis and progression - clear from a retina standpoint to proceed with cataract surgery when both patient and surgeon are ready - pt had surgery scheduled on April 3rd with Dr. Zenaida Niece -- surgery was canceled due to pt not having anyone to drive him - scheduled for cataract surgery OS tomorrow with Dr. Zenaida Niece -- 03/31/23  Ophthalmic Meds Ordered this visit:  Meds ordered this encounter  Medications   aflibercept (EYLEA) SOLN 2 mg   aflibercept (EYLEA) SOLN 2 mg  Return in about 5 weeks (around 05/04/2023) for f/u PDR OU, DFE, OCT, Possible, IVE, OU.  There are no Patient Instructions on file for this visit.  This document serves as a record of services personally performed by Karie Chimera, MD, PhD. It was created on their behalf by De Blanch, an ophthalmic technician. The creation of this record is the provider's dictation and/or activities during the visit.    Electronically signed by: De Blanch, OA, 03/30/23  5:01  PM  This document serves as a record of services personally performed by Karie Chimera, MD, PhD. It was created on their behalf by Gerilyn Nestle, COT an ophthalmic technician. The creation of this record is the provider's dictation and/or activities during the visit.    Electronically signed by:  Charlette Caffey, COT  03/30/23 5:01 PM  Karie Chimera, M.D., Ph.D. Diseases & Surgery of the Retina and Vitreous [facility]  I have reviewed the above documentation for accuracy and completeness, and I agree with the above. Karie Chimera, M.D., Ph.D. 03/30/23 5:03 PM   Abbreviations: M myopia (nearsighted); A astigmatism; H hyperopia (farsighted); P presbyopia; Mrx spectacle prescription;  CTL contact lenses; OD right eye; OS left eye; OU both eyes  XT exotropia; ET esotropia; PEK punctate epithelial keratitis; PEE punctate epithelial erosions; DES dry eye syndrome; MGD meibomian gland dysfunction; ATs artificial tears; PFAT's preservative free artificial tears; NSC nuclear sclerotic cataract; PSC posterior subcapsular cataract; ERM epi-retinal membrane; PVD posterior vitreous detachment; RD retinal detachment; DM diabetes mellitus; DR diabetic retinopathy; NPDR non-proliferative diabetic retinopathy; PDR proliferative diabetic retinopathy; CSME clinically significant macular edema; DME diabetic macular edema; dbh dot blot hemorrhages; CWS cotton wool spot; POAG primary open angle glaucoma; C/D cup-to-disc ratio; HVF humphrey visual field; GVF goldmann visual field; OCT optical coherence tomography; IOP intraocular pressure; BRVO Branch retinal vein occlusion; CRVO central retinal vein occlusion; CRAO central retinal artery occlusion; BRAO branch retinal artery occlusion; RT retinal tear; SB scleral buckle; PPV pars plana vitrectomy; VH Vitreous hemorrhage; PRP panretinal laser photocoagulation; IVK intravitreal kenalog; VMT vitreomacular traction; MH Macular hole;  NVD  neovascularization of the disc; NVE neovascularization elsewhere; AREDS age related eye disease study; ARMD age related macular degeneration; POAG primary open angle glaucoma; EBMD epithelial/anterior basement membrane dystrophy; ACIOL anterior chamber intraocular lens; IOL intraocular lens; PCIOL posterior chamber intraocular lens; Phaco/IOL phacoemulsification with intraocular lens placement; PRK photorefractive keratectomy; LASIK laser assisted in situ keratomileusis; HTN hypertension; DM diabetes mellitus; COPD chronic obstructive pulmonary disease

## 2023-03-30 ENCOUNTER — Ambulatory Visit (INDEPENDENT_AMBULATORY_CARE_PROVIDER_SITE_OTHER): Payer: 59 | Admitting: Ophthalmology

## 2023-03-30 ENCOUNTER — Encounter (INDEPENDENT_AMBULATORY_CARE_PROVIDER_SITE_OTHER): Payer: Self-pay | Admitting: Ophthalmology

## 2023-03-30 DIAGNOSIS — H25813 Combined forms of age-related cataract, bilateral: Secondary | ICD-10-CM | POA: Diagnosis not present

## 2023-03-30 DIAGNOSIS — I10 Essential (primary) hypertension: Secondary | ICD-10-CM | POA: Diagnosis not present

## 2023-03-30 DIAGNOSIS — E113511 Type 2 diabetes mellitus with proliferative diabetic retinopathy with macular edema, right eye: Secondary | ICD-10-CM

## 2023-03-30 DIAGNOSIS — Z794 Long term (current) use of insulin: Secondary | ICD-10-CM | POA: Diagnosis not present

## 2023-03-30 DIAGNOSIS — H35033 Hypertensive retinopathy, bilateral: Secondary | ICD-10-CM

## 2023-03-30 DIAGNOSIS — E113412 Type 2 diabetes mellitus with severe nonproliferative diabetic retinopathy with macular edema, left eye: Secondary | ICD-10-CM | POA: Diagnosis not present

## 2023-03-30 DIAGNOSIS — Z7984 Long term (current) use of oral hypoglycemic drugs: Secondary | ICD-10-CM

## 2023-03-30 MED ORDER — AFLIBERCEPT 2MG/0.05ML IZ SOLN FOR KALEIDOSCOPE
2.0000 mg | INTRAVITREAL | Status: AC | PRN
Start: 2023-03-30 — End: 2023-03-30
  Administered 2023-03-30: 2 mg via INTRAVITREAL

## 2023-03-31 DIAGNOSIS — H268 Other specified cataract: Secondary | ICD-10-CM | POA: Diagnosis not present

## 2023-03-31 DIAGNOSIS — H25812 Combined forms of age-related cataract, left eye: Secondary | ICD-10-CM | POA: Diagnosis not present

## 2023-04-21 NOTE — Progress Notes (Signed)
[facility] - Clinic Note  05/05/2023    CHIEF COMPLAINT Patient presents for Retina Follow Up  HISTORY OF PRESENT ILLNESS: Earl Calderon is a 72 y.o. male who presents to the clinic today for:   HPI     Retina Follow Up   Patient presents with  Diabetic Retinopathy.  In right eye.  Severity is moderate.  Duration of 5 weeks.  Since onset it is stable.  I, the attending physician,  performed the HPI with the patient and updated documentation appropriately.        Comments   Pt here for 5 wk ret f/u PDR OD. Pt states [facility] the same, had cataract sx OS on 07/30.24 w/ Dr. Zenaida Niece, has a f/u tomorrow. Pt on post op cataract sx gtts OS, didn't bring them w/ him.       Last edited by Rennis Chris, MD on 05/05/2023  6:08 PM.    Pt had cataract sx OS with Dr. Zenaida Niece on 07.30.24, he has an appt with her tomorrow, pt states both eyes are irritated and draining. Reports excellent vision OS after surgery, but reports poor compliance with post-op drops because his vision was so clear.  Referring physician: Olivia Canter, MD [address] [place],  [place] [address]  HISTORICAL INFORMATION:   Selected notes from the MEDICAL RECORD NUMBER Referred by Dr. Laruth Bouchard for severe NPDR OU   CURRENT MEDICATIONS: No current outpatient medications on file. (Ophthalmic Drugs)   No current facility-administered medications for this visit. (Ophthalmic Drugs)   Current Outpatient Medications (Other)  Medication Sig   APPLE CIDER VINEGAR PO Take 1 tablet by mouth daily at 6 (six) AM.   aspirin EC (ASPIRIN LOW DOSE) 81 MG tablet Take 1 tablet (81 mg total) by mouth daily. Swallow whole.   carvedilol (COREG) 6.25 MG tablet Take 1 tablet (6.25 mg total) by mouth 2 (two) times daily with a meal.   ELDERBERRY PO Take 100 mg by mouth daily.   ferrous sulfate 325 (65 FE) MG tablet Take 1 tablet (325 mg total) by mouth daily with breakfast.   furosemide (LASIX) 40 MG tablet TAKE 1  TABLET BY MOUTH ONCE DAILY   glucose blood (FREESTYLE TEST STRIPS) test strip Use as instructed   HUMALOG KWIKPEN 100 UNIT/ML KwikPen Inject into the skin. Sliding Scale   hydrALAZINE (APRESOLINE) 25 MG tablet TAKE 1 TABLET BY MOUTH IN THE MORNING, AND TAKE 1 TABLET AT BEDTIME   Insulin Pen Needle (NOVOFINE PEN NEEDLE) 32G X 6 MM MISC USE AS DIRECTED   JARDIANCE 10 MG TABS tablet TAKE ONE TABLET BY MOUTH DAILY BEFORE BREAKFAST   LEVEMIR FLEXTOUCH 100 UNIT/ML FlexPen Inject 8 Units into the skin 2 (two) times daily.   Probiotic Product (PROBIOTIC MULTI-ENZYME) TABS Take 3 tablets by mouth daily at 6 (six) AM.   rosuvastatin (CRESTOR) 40 MG tablet TAKE 1 TABLET BY MOUTH ONCE DAILY AT 6PM   sacubitril-valsartan (ENTRESTO) 97-103 MG TAKE ONE TABLET BY MOUTH TWO TIMES DAILY   spironolactone (ALDACTONE) 25 MG tablet TAKE 1 TABLET BY MOUTH ONCE DAILY   Turmeric 500 MG TABS Take 2 tablets by mouth daily at 6 (six) AM.   nitroGLYCERIN (NITROSTAT) 0.4 MG SL tablet Place 1 tablet (0.4 mg total) under the tongue every 5 (five) minutes as needed for chest pain.   No current facility-administered medications for this visit. (Other)   REVIEW OF SYSTEMS: ROS   Positive for: Endocrine,  Cardiovascular, Eyes Negative for: Constitutional, Gastrointestinal, Neurological, Skin, Genitourinary, Musculoskeletal, HENT, Respiratory, Psychiatric, Allergic/Imm, Heme/Lymph Last edited by Danzy Grayer, COT on 05/05/2023  1:12 PM.       ALLERGIES No Known Allergies  PAST MEDICAL HISTORY Past Medical History:  Diagnosis Date   CAD in native artery 01/01/2021   Prior LAD PCI.  Currently no symptoms of ischemia.  Encouraged him to do some light exercising.  Continue aspirin, carvedilol, and rosuvastatin.   Cataract    CKD (chronic kidney disease), stage III (HCC) 08/24/2020   Coronary artery disease    Diabetes mellitus without complication (HCC)    on meds   Diabetic retinopathy (HCC)    Goals of care,  counseling/discussion 07/01/2021   Hernia, inguinal    Hypertension    on meds   Hypertensive retinopathy    Lymphadenopathy, cervical 07/01/2021   MGUS (monoclonal gammopathy of unknown significance) 07/01/2021   NSVT (nonsustained ventricular tachycardia) (HCC) 08/24/2020   Pure hypercholesterolemia 08/05/2021   Resistant hypertension 11/11/2018   Uncontrolled type 2 diabetes mellitus 11/11/2018   Past Surgical History:  Procedure Laterality Date   CORONARY STENT INTERVENTION N/A 11/15/2018   Procedure: CORONARY STENT INTERVENTION;  Surgeon: Corky Crafts, MD;  Location: [facility] INVASIVE CV LAB;  Service: Cardiovascular;  Laterality: N/A;   HEMORROIDECTOMY     RIGHT HEART CATH N/A 07/18/2020   Procedure: RIGHT HEART CATH;  Surgeon: Iran Ouch, MD;  Location: [facility] INVASIVE CV LAB;  Service: Cardiovascular;  Laterality: N/A;   RIGHT/LEFT HEART CATH AND CORONARY ANGIOGRAPHY N/A 11/15/2018   Procedure: RIGHT/LEFT HEART CATH AND CORONARY ANGIOGRAPHY;  Surgeon: Corky Crafts, MD;  Location: [facility] INVASIVE CV LAB;  Service: Cardiovascular;  Laterality: N/A;   WISDOM TOOTH EXTRACTION     FAMILY HISTORY Family History  Problem Relation Age of Onset   Hypertension Mother    Diabetes Mellitus II Mother    Arrhythmia Mother    Heart disease Mother    Hypertension Sister    Hypertension Brother    Heart disease Brother    Kidney disease Brother    Hypertension Other    Diabetes Mellitus II Other        All 9 sibblings have HTN   Colon cancer Neg Hx    Colon polyps Neg Hx    Esophageal cancer Neg Hx    Stomach cancer Neg Hx    Rectal cancer Neg Hx    SOCIAL HISTORY Social History   Tobacco Use   Smoking status: Never   Smokeless tobacco: Never  Vaping Use   Vaping status: Never Used  Substance Use Topics   Alcohol use: Not Currently   Drug use: Never       OPHTHALMIC EXAM:  Base Eye Exam     Visual Acuity (Snellen - Linear)       Right Left   Dist [place]  20/30 -2 20/70 +2   Dist ph [place] NI 20/60 -2         Tonometry (Tonopen, 1:18 PM)       Right Left   Pressure 17 13         Pupils       Dark Light Shape React APD   Right 3 2 Round Brisk None   Left 3 2 Round Brisk None         Visual Fields       Left Right   Restrictions Partial outer superior temporal deficiency Partial outer superior nasal deficiency  Extraocular Movement       Right Left    Full, Ortho Full, Ortho         Neuro/Psych     Oriented x3: Yes   Mood/Affect: Normal         Dilation     Both eyes: 1.0% Mydriacyl, 2.5% Phenylephrine @ 1:19 PM           Slit Lamp and Fundus Exam     Slit Lamp Exam       Right Left   Lids/Lashes Dermatochalasis - upper lid Dermatochalasis - upper lid   Conjunctiva/Sclera Melanosis Melanosis   Cornea arcus, fine endo pigment arcus, well healed cataract wound, trace PEE   Anterior Chamber deep and clear, no cell or flare deep, 0.5+cell   Iris Round and dilated, No NVI Round and dilated, No NVI   Lens 2-3+ Nuclear sclerosis, 2-3+ Cortical cataract PC IOL in good position   Anterior Vitreous Vitreous syneresis Vitreous syneresis         Fundus Exam       Right Left   Disc Pink and Sharp, no NVD, +PPP mild Pallor, Sharp rim, mild PPP, no NVD   C/D Ratio 0.6 0.5   Macula good foveal reflex, central edema -- slightly improved, scattered MA/DBH -- improved, no exudates good foveal reflex, scattered MA/DBH, +cystic changes temporal macula -- slightly increased, no exudates or CWS   Vessels attenuated, tortuous, focal NV IN arcades -- regressing, mild copper wiring attenuated, Tortuous   Periphery Attached, 360 MA/DBH greatest posteriorly, light 360 PRP changes with room for fill in if needed Attached, 360 DBH           IMAGING AND PROCEDURES  Imaging and Procedures for 05/05/2023  OCT, Retina - OU - Both Eyes       Right Eye Quality was good. Central Foveal Thickness: 230. Progression  has improved. Findings include no SRF, abnormal foveal contour, intraretinal hyper-reflective material, intraretinal fluid, vitreomacular adhesion (Interval improvement in IRF greatest nasal and central macula, partial PVD).   Left Eye Quality was good. Central Foveal Thickness: 220. Progression has worsened. Findings include no SRF, abnormal foveal contour, intraretinal hyper-reflective material, intraretinal fluid, vitreomacular adhesion (Interval increase in IRF/IRHM temporal fovea and macula, patchy ORA).   Notes *Images captured and stored on drive  Diagnosis / Impression:  +DME OU OD: Interval improvement in IRF greatest nasal and central macula, partial PVD OS: Interval increase in IRF/IRHM temporal fovea and macula, patchy ORA  Clinical management:  See below  Abbreviations: NFP - Normal foveal profile. CME - cystoid macular edema. PED - pigment epithelial detachment. IRF - intraretinal fluid. SRF - subretinal fluid. EZ - ellipsoid zone. ERM - epiretinal membrane. ORA - outer retinal atrophy. ORT - outer retinal tubulation. SRHM - subretinal hyper-reflective material. IRHM - intraretinal hyper-reflective material      Intravitreal Injection, Pharmacologic Agent - OD - Right Eye       Time Out 05/05/2023. 1:52 PM. Confirmed correct patient, procedure, site, and patient consented.   Anesthesia Topical anesthesia was used. Anesthetic medications included Lidocaine 2%, Proparacaine 0.5%.   Procedure Preparation included 5% betadine to ocular surface, eyelid speculum. A (32g) needle was used.   Injection: 2 mg aflibercept 2 MG/0.05ML   Route: Intravitreal, Site: Right Eye   NDC: L6038910, Lot: [id number], Expiration date: 07/31/2024, Waste: 0 mL   Post-op Post injection exam found visual acuity of at least counting fingers. The patient tolerated the procedure well. There were no  complications. The patient received written and verbal post procedure care education. Post  injection medications were not given.            ASSESSMENT/PLAN:    ICD-10-CM   1. Proliferative diabetic retinopathy of right eye with macular edema associated with type 2 diabetes mellitus (HCC)  E11.3511 OCT, Retina - OU - Both Eyes    Intravitreal Injection, Pharmacologic Agent - OD - Right Eye    aflibercept (EYLEA) SOLN 2 mg    2. Severe nonproliferative diabetic retinopathy of left eye with macular edema associated with type 2 diabetes mellitus (HCC)  Z61.0960 CANCELED: Intravitreal Injection, Pharmacologic Agent - OS - Left Eye    3. Current use of insulin (HCC)  Z79.4     4. Long term (current) use of oral hypoglycemic drugs  Z79.84     5. Essential hypertension  I10     6. Hypertensive retinopathy of both eyes  H35.033     7. Combined forms of age-related cataract of right eye  H25.811     8. Pseudophakia  Z96.1      1-4. Proliferative diabetic retinopathy OD  - severe NPDR OS - delayed to follow up from 10.28.22 to 01.04.23 (9+ wks) - s/p IVA OU #1 (06.06.22), #2 (07.08.22), #3 (08.05.22), #4 (09.02.22), #5 (09.30.22), #6 (10.28.22), #7 (01.04.23), #8 (02.20.33), #9 (04.03.23), #10 (05.15.23), #11 (06.28.23), #12 (08.16.23), #13 (10.10.23), #14 (11.14.23), #15 (01.15.24), #16 (02.26.24) , #17 (04.03.24), #18 (05.20.24) -- IVA resistance - s/p IVE OU #1 (06.24.24), #2 (07.29.24) **h/o increased IRF/DME OU at 7 wks, noted on 08.16.23**  - s/p PRP OD (06.10.22) - exam shows central edema OU, scattered IRH -- improving - FA (06.06.22) shows OD: focal NVE IN arcades; OS: no NV OS; late leaking MA's OU -- s/p PRP OD 6.10.22 - BCVA -- OD 20/30 (improved); OS 20/60 (stable) - OCT shows OD: Interval improvement in IRF greatest nasal and central macula, partial PVD; OS: Interval increase in IRF/IRHM temporal fovea and macula, patchy ORA - recommend IVE OU #3 today, 09.03.24, but pt wishes to proceed with OD only -- wants to hold OS due to irritation and post-CEIOL status -  pt wishes to proceed with injection OD only - RBA of procedure discussed, questions answered - IVE informed consent obtained and signed (OU), 06.24.24 - see procedure note - Eylea approved for 2024 - f/u 1 week, DFE/OCT, possible injection OS  5,6. Hypertensive retinopathy OU - discussed importance of tight BP control - monitor  7. Mixed Cataract OD - The symptoms of cataract, surgical options, and treatments and risks were discussed with patient. - discussed diagnosis and progression - under the expert management of Dr. Zenaida Niece - clear from a retina standpoint to proceed with cataract surgery when pt and surgeon are ready   8. Pseudophakia OS  - s/p CE/IOL OS(Dr. Zenaida Niece, 07.30.24)  - IOL in good position  - pt reports poor compliance with post-op drops due to drastic improvement in vision OS  - monitor    Ophthalmic Meds Ordered this visit:  Meds ordered this encounter  Medications   aflibercept (EYLEA) SOLN 2 mg     Return in about 1 week (around 05/12/2023) for f/u PDR OU, DFE, OCT.  There are no Patient Instructions on file for this visit.  Electronically signed by: De Blanch, OA, 05/05/23    This document serves as a record of services personally performed by Karie Chimera, MD, PhD. It was created on their behalf by  Laurey Morale, COT an ophthalmic technician. The creation of this record is the provider's dictation and/or activities during the visit.    Electronically signed by:  Charlette Caffey, COT  05/05/23 6:13 PM  Karie Chimera, M.D., Ph.D. Diseases & Surgery of the Retina and Vitreous [facility]  I have reviewed the above documentation for accuracy and completeness, and I agree with the above. Karie Chimera, M.D., Ph.D. 05/05/23 6:16 PM   Abbreviations: M myopia (nearsighted); A astigmatism; H hyperopia (farsighted); P presbyopia; Mrx spectacle prescription;  CTL contact lenses; OD right eye; OS left eye; OU both eyes  XT  exotropia; ET esotropia; PEK punctate epithelial keratitis; PEE punctate epithelial erosions; DES dry eye syndrome; MGD meibomian gland dysfunction; ATs artificial tears; PFAT's preservative free artificial tears; NSC nuclear sclerotic cataract; PSC posterior subcapsular cataract; ERM epi-retinal membrane; PVD posterior vitreous detachment; RD retinal detachment; DM diabetes mellitus; DR diabetic retinopathy; NPDR non-proliferative diabetic retinopathy; PDR proliferative diabetic retinopathy; CSME clinically significant macular edema; DME diabetic macular edema; dbh dot blot hemorrhages; CWS cotton wool spot; POAG primary open angle glaucoma; C/D cup-to-disc ratio; HVF humphrey visual field; GVF goldmann visual field; OCT optical coherence tomography; IOP intraocular pressure; BRVO Branch retinal vein occlusion; CRVO central retinal vein occlusion; CRAO central retinal artery occlusion; BRAO branch retinal artery occlusion; RT retinal tear; SB scleral buckle; PPV pars plana vitrectomy; VH Vitreous hemorrhage; PRP panretinal laser photocoagulation; IVK intravitreal kenalog; VMT vitreomacular traction; MH Macular hole;  NVD neovascularization of the disc; NVE neovascularization elsewhere; AREDS age related eye disease study; ARMD age related macular degeneration; POAG primary open angle glaucoma; EBMD epithelial/anterior basement membrane dystrophy; ACIOL anterior chamber intraocular lens; IOL intraocular lens; PCIOL posterior chamber intraocular lens; Phaco/IOL phacoemulsification with intraocular lens placement; PRK photorefractive keratectomy; LASIK laser assisted in situ keratomileusis; HTN hypertension; DM diabetes mellitus; COPD chronic obstructive pulmonary disease

## 2023-04-30 ENCOUNTER — Other Ambulatory Visit: Payer: Self-pay | Admitting: Cardiovascular Disease

## 2023-04-30 DIAGNOSIS — E785 Hyperlipidemia, unspecified: Secondary | ICD-10-CM

## 2023-05-05 ENCOUNTER — Ambulatory Visit (INDEPENDENT_AMBULATORY_CARE_PROVIDER_SITE_OTHER): Payer: 59 | Admitting: Ophthalmology

## 2023-05-05 ENCOUNTER — Encounter (INDEPENDENT_AMBULATORY_CARE_PROVIDER_SITE_OTHER): Payer: Self-pay | Admitting: Ophthalmology

## 2023-05-05 DIAGNOSIS — Z7984 Long term (current) use of oral hypoglycemic drugs: Secondary | ICD-10-CM

## 2023-05-05 DIAGNOSIS — Z794 Long term (current) use of insulin: Secondary | ICD-10-CM

## 2023-05-05 DIAGNOSIS — Z961 Presence of intraocular lens: Secondary | ICD-10-CM

## 2023-05-05 DIAGNOSIS — E113511 Type 2 diabetes mellitus with proliferative diabetic retinopathy with macular edema, right eye: Secondary | ICD-10-CM | POA: Diagnosis not present

## 2023-05-05 DIAGNOSIS — I10 Essential (primary) hypertension: Secondary | ICD-10-CM | POA: Diagnosis not present

## 2023-05-05 DIAGNOSIS — H35033 Hypertensive retinopathy, bilateral: Secondary | ICD-10-CM

## 2023-05-05 DIAGNOSIS — H25811 Combined forms of age-related cataract, right eye: Secondary | ICD-10-CM

## 2023-05-05 DIAGNOSIS — H25813 Combined forms of age-related cataract, bilateral: Secondary | ICD-10-CM

## 2023-05-05 DIAGNOSIS — E113412 Type 2 diabetes mellitus with severe nonproliferative diabetic retinopathy with macular edema, left eye: Secondary | ICD-10-CM | POA: Diagnosis not present

## 2023-05-05 MED ORDER — AFLIBERCEPT 2MG/0.05ML IZ SOLN FOR KALEIDOSCOPE
2.0000 mg | INTRAVITREAL | Status: AC | PRN
Start: 2023-05-05 — End: 2023-05-05
  Administered 2023-05-05: 2 mg via INTRAVITREAL

## 2023-05-12 ENCOUNTER — Encounter (INDEPENDENT_AMBULATORY_CARE_PROVIDER_SITE_OTHER): Payer: 59 | Admitting: Ophthalmology

## 2023-05-12 DIAGNOSIS — Z794 Long term (current) use of insulin: Secondary | ICD-10-CM

## 2023-05-12 DIAGNOSIS — E113412 Type 2 diabetes mellitus with severe nonproliferative diabetic retinopathy with macular edema, left eye: Secondary | ICD-10-CM

## 2023-05-12 DIAGNOSIS — Z961 Presence of intraocular lens: Secondary | ICD-10-CM

## 2023-05-12 DIAGNOSIS — H35033 Hypertensive retinopathy, bilateral: Secondary | ICD-10-CM

## 2023-05-12 DIAGNOSIS — Z7984 Long term (current) use of oral hypoglycemic drugs: Secondary | ICD-10-CM

## 2023-05-12 DIAGNOSIS — E113511 Type 2 diabetes mellitus with proliferative diabetic retinopathy with macular edema, right eye: Secondary | ICD-10-CM

## 2023-05-12 DIAGNOSIS — H25811 Combined forms of age-related cataract, right eye: Secondary | ICD-10-CM

## 2023-05-12 DIAGNOSIS — I10 Essential (primary) hypertension: Secondary | ICD-10-CM

## 2023-05-18 ENCOUNTER — Other Ambulatory Visit: Payer: Self-pay | Admitting: Cardiovascular Disease

## 2023-05-19 NOTE — Progress Notes (Signed)
[facility] - Clinic Note  05/20/2023    CHIEF COMPLAINT Patient presents for Retina Follow Up  HISTORY OF PRESENT ILLNESS: Earl Calderon is a 72 y.o. male who presents to the clinic today for:   HPI     Retina Follow Up   Patient presents with  Diabetic Retinopathy.  In both eyes.  This started 2 weeks ago.  Duration of 2 weeks.  Since onset it is stable.  I, the attending physician,  performed the HPI with the patient and updated documentation appropriately.        Comments   2 week retina follow up PDR OU IVE OS pt is reporting no vision changes noticed he has some floaters but denies any flashes of light his last reading was this am states it was pretty good this morning pt saw Dr Zenaida Niece this morning he states his IOP was 33 in the right eye he is taking atropine qid OS Moxifloxacin qid OS pred bid OS he states she did not add any gtts today but gave him Simbrinza BID OD 2 weeks ago he did not start them he was told today to start them      Last edited by Rennis Chris, MD on 05/20/2023  4:41 PM.    Pt is using PF BID OS, Moxifloxacin QID OS and Simbrinza BID OS, he saw Dr. Zenaida Niece this morning   Referring physician: Olivia Canter, MD [address] [place],  [place] [address]  HISTORICAL INFORMATION:   Selected notes from the MEDICAL RECORD NUMBER Referred by Dr. Laruth Bouchard for severe NPDR OU   CURRENT MEDICATIONS: No current outpatient medications on file. (Ophthalmic Drugs)   No current facility-administered medications for this visit. (Ophthalmic Drugs)   Current Outpatient Medications (Other)  Medication Sig   APPLE CIDER VINEGAR PO Take 1 tablet by mouth daily at 6 (six) AM.   aspirin EC (ASPIRIN LOW DOSE) 81 MG tablet Take 1 tablet (81 mg total) by mouth daily. Swallow whole.   carvedilol (COREG) 6.25 MG tablet Take 1 tablet (6.25 mg total) by mouth 2 (two) times daily with a meal.   ELDERBERRY PO Take 100 mg by mouth daily.   ferrous  sulfate 325 (65 FE) MG tablet Take 1 tablet (325 mg total) by mouth daily with breakfast.   furosemide (LASIX) 40 MG tablet TAKE 1 TABLET BY MOUTH ONCE DAILY   glucose blood (FREESTYLE TEST STRIPS) test strip Use as instructed   HUMALOG KWIKPEN 100 UNIT/ML KwikPen Inject into the skin. Sliding Scale   hydrALAZINE (APRESOLINE) 25 MG tablet TAKE 1 TABLET BY MOUTH IN THE MORNING, AND TAKE 1 TABLET AT BEDTIME   Insulin Pen Needle (NOVOFINE PEN NEEDLE) 32G X 6 MM MISC USE AS DIRECTED   JARDIANCE 10 MG TABS tablet TAKE ONE TABLET BY MOUTH DAILY BEFORE BREAKFAST   LEVEMIR FLEXTOUCH 100 UNIT/ML FlexPen Inject 8 Units into the skin 2 (two) times daily.   nitroGLYCERIN (NITROSTAT) 0.4 MG SL tablet Place 1 tablet (0.4 mg total) under the tongue every 5 (five) minutes as needed for chest pain.   Probiotic Product (PROBIOTIC MULTI-ENZYME) TABS Take 3 tablets by mouth daily at 6 (six) AM.   rosuvastatin (CRESTOR) 40 MG tablet TAKE 1 TABLET BY MOUTH ONCE DAILY AT 6PM   sacubitril-valsartan (ENTRESTO) 97-103 MG TAKE ONE TABLET BY MOUTH TWO TIMES DAILY   spironolactone (ALDACTONE) 25 MG tablet TAKE 1 TABLET BY MOUTH ONCE DAILY   Turmeric  500 MG TABS Take 2 tablets by mouth daily at 6 (six) AM.   No current facility-administered medications for this visit. (Other)   REVIEW OF SYSTEMS: ROS   Positive for: Endocrine, Cardiovascular, Eyes Negative for: Constitutional, Gastrointestinal, Neurological, Skin, Genitourinary, Musculoskeletal, HENT, Respiratory, Psychiatric, Allergic/Imm, Heme/Lymph Last edited by Etheleen Mayhew, COT on 05/20/2023 12:42 PM.     ALLERGIES No Known Allergies  PAST MEDICAL HISTORY Past Medical History:  Diagnosis Date   CAD in native artery 01/01/2021   Prior LAD PCI.  Currently no symptoms of ischemia.  Encouraged him to do some light exercising.  Continue aspirin, carvedilol, and rosuvastatin.   Cataract    CKD (chronic kidney disease), stage III (HCC) 08/24/2020    Coronary artery disease    Diabetes mellitus without complication (HCC)    on meds   Diabetic retinopathy (HCC)    Goals of care, counseling/discussion 07/01/2021   Hernia, inguinal    Hypertension    on meds   Hypertensive retinopathy    Lymphadenopathy, cervical 07/01/2021   MGUS (monoclonal gammopathy of unknown significance) 07/01/2021   NSVT (nonsustained ventricular tachycardia) (HCC) 08/24/2020   Pure hypercholesterolemia 08/05/2021   Resistant hypertension 11/11/2018   Uncontrolled type 2 diabetes mellitus 11/11/2018   Past Surgical History:  Procedure Laterality Date   CORONARY STENT INTERVENTION N/A 11/15/2018   Procedure: CORONARY STENT INTERVENTION;  Surgeon: Corky Crafts, MD;  Location: [facility] INVASIVE CV LAB;  Service: Cardiovascular;  Laterality: N/A;   HEMORROIDECTOMY     RIGHT HEART CATH N/A 07/18/2020   Procedure: RIGHT HEART CATH;  Surgeon: Iran Ouch, MD;  Location: [facility] INVASIVE CV LAB;  Service: Cardiovascular;  Laterality: N/A;   RIGHT/LEFT HEART CATH AND CORONARY ANGIOGRAPHY N/A 11/15/2018   Procedure: RIGHT/LEFT HEART CATH AND CORONARY ANGIOGRAPHY;  Surgeon: Corky Crafts, MD;  Location: [facility] INVASIVE CV LAB;  Service: Cardiovascular;  Laterality: N/A;   WISDOM TOOTH EXTRACTION     FAMILY HISTORY Family History  Problem Relation Age of Onset   Hypertension Mother    Diabetes Mellitus II Mother    Arrhythmia Mother    Heart disease Mother    Hypertension Sister    Hypertension Brother    Heart disease Brother    Kidney disease Brother    Hypertension Other    Diabetes Mellitus II Other        All 9 sibblings have HTN   Colon cancer Neg Hx    Colon polyps Neg Hx    Esophageal cancer Neg Hx    Stomach cancer Neg Hx    Rectal cancer Neg Hx    SOCIAL HISTORY Social History   Tobacco Use   Smoking status: Never   Smokeless tobacco: Never  Vaping Use   Vaping status: Never Used  Substance Use Topics   Alcohol use: Not Currently    Drug use: Never       OPHTHALMIC EXAM:  Base Eye Exam     Visual Acuity (Snellen - Linear)       Right Left   Dist [place] 20/25 -3 20/60 -2   Dist ph [place] NI NI         Tonometry (Tonopen, 12:57 PM)       Right Left   Pressure 24 16         Pupils       Pupils Dark Light Shape React APD   Right PERRL 3 2 Round Brisk None   Left PERRL 3 2 Round  Brisk None         Visual Fields       Left Right    Full Full         Extraocular Movement       Right Left    Full, Ortho Full, Ortho         Neuro/Psych     Oriented x3: Yes   Mood/Affect: Normal         Dilation     Both eyes: 2.5% Phenylephrine @ 12:57 PM           Slit Lamp and Fundus Exam     Slit Lamp Exam       Right Left   Lids/Lashes Dermatochalasis - upper lid Dermatochalasis - upper lid   Conjunctiva/Sclera Melanosis Melanosis   Cornea arcus, fine endo pigment arcus, well healed cataract wound, trace PEE   Anterior Chamber deep and clear, no cell or flare deep, 0.5+cell / pigment   Iris Round and dilated, No NVI Round and dilated, No NVI   Lens 2-3+ Nuclear sclerosis, 2-3+ Cortical cataract PC IOL in good position   Anterior Vitreous Vitreous syneresis Vitreous syneresis         Fundus Exam       Right Left   Disc Pink and Sharp, no NVD, +PPP mild Pallor, Sharp rim, mild PPP, no NVD   C/D Ratio 0.6 0.5   Macula good foveal reflex, central edema -- slightly improved, scattered MA/DBH -- improved, no exudates good foveal reflex, scattered MA/DBH, +cystic changes temporal macula -- slightly improved, no exudates or CWS   Vessels attenuated, tortuous, focal NV IN arcades -- regressing, mild copper wiring attenuated, Tortuous   Periphery Attached, 360 MA/DBH greatest posteriorly, light 360 PRP changes with room for fill in if needed Attached, 360 DBH           IMAGING AND PROCEDURES  Imaging and Procedures for 05/20/2023  OCT, Retina - OU - Both Eyes       Right  Eye Quality was good. Central Foveal Thickness: 233. Progression has improved. Findings include no SRF, abnormal foveal contour, intraretinal hyper-reflective material, intraretinal fluid, vitreomacular adhesion (Interval improvement in IRF greatest nasal and central macula, partial PVD).   Left Eye Quality was good. Central Foveal Thickness: 205. Progression has improved. Findings include no SRF, abnormal foveal contour, intraretinal hyper-reflective material, intraretinal fluid, vitreomacular adhesion (Interval improvement in IRF/IRHM temporal fovea and macula, patchy ORA).   Notes *Images captured and stored on drive  Diagnosis / Impression:  +DME OU OD: Interval improvement in IRF greatest nasal and central macula, partial PVD OS: Interval improvement in IRF/IRHM temporal fovea and macula, patchy ORA  Clinical management:  See below  Abbreviations: NFP - Normal foveal profile. CME - cystoid macular edema. PED - pigment epithelial detachment. IRF - intraretinal fluid. SRF - subretinal fluid. EZ - ellipsoid zone. ERM - epiretinal membrane. ORA - outer retinal atrophy. ORT - outer retinal tubulation. SRHM - subretinal hyper-reflective material. IRHM - intraretinal hyper-reflective material      Intravitreal Injection, Pharmacologic Agent - OS - Left Eye       Time Out 05/20/2023. 1:36 PM. Confirmed correct patient, procedure, site, and patient consented.   Anesthesia Topical anesthesia was used. Anesthetic medications included Lidocaine 2%, Proparacaine 0.5%.   Procedure Preparation included 5% betadine to ocular surface, eyelid speculum. A (32g) needle was used.   Injection: 2 mg aflibercept 2 MG/0.05ML   Route: Intravitreal, Site: Left Eye  NDC: L6038910, Lot: [id number], Expiration date: 07/31/2024, Waste: 0 mL   Post-op Post injection exam found visual acuity of at least counting fingers. The patient tolerated the procedure well. There were no complications. The  patient received written and verbal post procedure care education. Post injection medications were not given.            ASSESSMENT/PLAN:    ICD-10-CM   1. Proliferative diabetic retinopathy of right eye with macular edema associated with type 2 diabetes mellitus (HCC)  E11.3511 OCT, Retina - OU - Both Eyes    Intravitreal Injection, Pharmacologic Agent - OS - Left Eye    aflibercept (EYLEA) SOLN 2 mg    2. Severe nonproliferative diabetic retinopathy of left eye with macular edema associated with type 2 diabetes mellitus (HCC)  E11.3412 OCT, Retina - OU - Both Eyes    3. Current use of insulin (HCC)  Z79.4     4. Long term (current) use of oral hypoglycemic drugs  Z79.84     5. Essential hypertension  I10     6. Hypertensive retinopathy of both eyes  H35.033     7. Combined forms of age-related cataract of right eye  H25.811     8. Pseudophakia  Z96.1      1-4. Proliferative diabetic retinopathy OD  - severe NPDR OS - delayed to follow up from 10.28.22 to 01.04.23 (9+ wks) - s/p IVA OU #1 (06.06.22), #2 (07.08.22), #3 (08.05.22), #4 (09.02.22), #5 (09.30.22), #6 (10.28.22), #7 (01.04.23), #8 (02.20.33), #9 (04.03.23), #10 (05.15.23), #11 (06.28.23), #12 (08.16.23), #13 (10.10.23), #14 (11.14.23), #15 (01.15.24), #16 (02.26.24) , #17 (04.03.24), #18 (05.20.24) -- IVA resistance - s/p IVE OU #1 (06.24.24), #2 (07.29.24) - s/p IVE OD #4 (09.03.24) **h/o increased IRF/DME OU at 7 wks, noted on 08.16.23** - s/p PRP OD (06.10.22) - exam shows central edema OU, scattered IRH -- improving - FA (06.06.22) shows OD: focal NVE IN arcades; OS: no NV OS; late leaking MA's OU -- s/p PRP OD 6.10.22 - BCVA -- OD 20/25 (improved); OS 20/60 (stable) - OCT shows OD: Interval improvement in IRF greatest nasal and central macula, partial PVD; OS: Interval improvement in IRF/IRHM temporal fovea and macula, patchy ORA - recommend IVE OS #3 today, 09.17.24 - pt wishes to proceed - RBA of  procedure discussed, questions answered - IVE informed consent obtained and signed (OU), 06.24.24 - see procedure note - Eylea approved for 2024 - f/u 4 weeks, DFE/OCT, possible injection OU  5,6. Hypertensive retinopathy OU - discussed importance of tight BP control - monitor  7. Mixed Cataract OD - The symptoms of cataract, surgical options, and treatments and risks were discussed with patient. - discussed diagnosis and progression - under the expert management of Dr. Zenaida Niece - clear from a retina standpoint to proceed with cataract surgery when pt and surgeon are ready   8. Pseudophakia OS  - s/p CE/IOL OS (Dr. Zenaida Niece, 07.30.24)  - IOL in good position  - pt did not follow post op drop instructions immediately after surgery, but now compliant with post op drop instructions per Dr. Zenaida Niece  - monitor  Ophthalmic Meds Ordered this visit:  Meds ordered this encounter  Medications   aflibercept (EYLEA) SOLN 2 mg     Return in about 4 weeks (around 06/17/2023) for f/u PDR OU, DFE, OCT.  There are no Patient Instructions on file for this visit.  This document serves as a record of services personally performed by Karie Chimera,  MD, PhD. It was created on their behalf by Glee Arvin. Manson Passey, OA an ophthalmic technician. The creation of this record is the provider's dictation and/or activities during the visit.    Electronically signed by: Glee Arvin. Manson Passey, OA 05/20/23 4:49 PM  Karie Chimera, M.D., Ph.D. Diseases & Surgery of the Retina and Vitreous [facility]  I have reviewed the above documentation for accuracy and completeness, and I agree with the above. Karie Chimera, M.D., Ph.D. 05/20/23 4:49 PM  Abbreviations: M myopia (nearsighted); A astigmatism; H hyperopia (farsighted); P presbyopia; Mrx spectacle prescription;  CTL contact lenses; OD right eye; OS left eye; OU both eyes  XT exotropia; ET esotropia; PEK punctate epithelial keratitis; PEE punctate  epithelial erosions; DES dry eye syndrome; MGD meibomian gland dysfunction; ATs artificial tears; PFAT's preservative free artificial tears; NSC nuclear sclerotic cataract; PSC posterior subcapsular cataract; ERM epi-retinal membrane; PVD posterior vitreous detachment; RD retinal detachment; DM diabetes mellitus; DR diabetic retinopathy; NPDR non-proliferative diabetic retinopathy; PDR proliferative diabetic retinopathy; CSME clinically significant macular edema; DME diabetic macular edema; dbh dot blot hemorrhages; CWS cotton wool spot; POAG primary open angle glaucoma; C/D cup-to-disc ratio; HVF humphrey visual field; GVF goldmann visual field; OCT optical coherence tomography; IOP intraocular pressure; BRVO Branch retinal vein occlusion; CRVO central retinal vein occlusion; CRAO central retinal artery occlusion; BRAO branch retinal artery occlusion; RT retinal tear; SB scleral buckle; PPV pars plana vitrectomy; VH Vitreous hemorrhage; PRP panretinal laser photocoagulation; IVK intravitreal kenalog; VMT vitreomacular traction; MH Macular hole;  NVD neovascularization of the disc; NVE neovascularization elsewhere; AREDS age related eye disease study; ARMD age related macular degeneration; POAG primary open angle glaucoma; EBMD epithelial/anterior basement membrane dystrophy; ACIOL anterior chamber intraocular lens; IOL intraocular lens; PCIOL posterior chamber intraocular lens; Phaco/IOL phacoemulsification with intraocular lens placement; PRK photorefractive keratectomy; LASIK laser assisted in situ keratomileusis; HTN hypertension; DM diabetes mellitus; COPD chronic obstructive pulmonary disease

## 2023-05-20 ENCOUNTER — Encounter (INDEPENDENT_AMBULATORY_CARE_PROVIDER_SITE_OTHER): Payer: Self-pay | Admitting: Ophthalmology

## 2023-05-20 ENCOUNTER — Ambulatory Visit (INDEPENDENT_AMBULATORY_CARE_PROVIDER_SITE_OTHER): Payer: 59 | Admitting: Ophthalmology

## 2023-05-20 DIAGNOSIS — E113511 Type 2 diabetes mellitus with proliferative diabetic retinopathy with macular edema, right eye: Secondary | ICD-10-CM | POA: Diagnosis not present

## 2023-05-20 DIAGNOSIS — Z7984 Long term (current) use of oral hypoglycemic drugs: Secondary | ICD-10-CM

## 2023-05-20 DIAGNOSIS — Z794 Long term (current) use of insulin: Secondary | ICD-10-CM

## 2023-05-20 DIAGNOSIS — Z961 Presence of intraocular lens: Secondary | ICD-10-CM | POA: Diagnosis not present

## 2023-05-20 DIAGNOSIS — I10 Essential (primary) hypertension: Secondary | ICD-10-CM | POA: Diagnosis not present

## 2023-05-20 DIAGNOSIS — E113412 Type 2 diabetes mellitus with severe nonproliferative diabetic retinopathy with macular edema, left eye: Secondary | ICD-10-CM | POA: Diagnosis not present

## 2023-05-20 DIAGNOSIS — H25811 Combined forms of age-related cataract, right eye: Secondary | ICD-10-CM | POA: Diagnosis not present

## 2023-05-20 DIAGNOSIS — H35033 Hypertensive retinopathy, bilateral: Secondary | ICD-10-CM | POA: Diagnosis not present

## 2023-05-20 MED ORDER — AFLIBERCEPT 2MG/0.05ML IZ SOLN FOR KALEIDOSCOPE
2.0000 mg | INTRAVITREAL | Status: AC | PRN
Start: 2023-05-20 — End: 2023-05-20
  Administered 2023-05-20: 2 mg via INTRAVITREAL

## 2023-05-28 ENCOUNTER — Other Ambulatory Visit: Payer: Self-pay | Admitting: Cardiovascular Disease

## 2023-06-09 NOTE — Progress Notes (Signed)
[facility] - Clinic Note  06/17/2023    CHIEF COMPLAINT Patient presents for Retina Follow Up  HISTORY OF PRESENT ILLNESS: Earl Calderon is a 72 y.o. male who presents to the clinic today for:   HPI     Retina Follow Up   Patient presents with  Diabetic Retinopathy.  In both eyes.  Severity is moderate.  Duration of 4 weeks.  Since onset it is stable.  I, the attending physician,  performed the HPI with the patient and updated documentation appropriately.        Comments   4 week Retina eval. Patient states vision seems the same      Last edited by Rennis Chris, MD on 06/17/2023 11:18 PM.     Pt states he is still on drops from his cataract sx, he is starting to see fol in the left eye  Referring physician: Olivia Canter, MD [address] [place],  [place] [address]  HISTORICAL INFORMATION:   Selected notes from the MEDICAL RECORD NUMBER Referred by Dr. Laruth Bouchard for severe NPDR OU   CURRENT MEDICATIONS: No current outpatient medications on file. (Ophthalmic Drugs)   No current facility-administered medications for this visit. (Ophthalmic Drugs)   Current Outpatient Medications (Other)  Medication Sig   APPLE CIDER VINEGAR PO Take 1 tablet by mouth daily at 6 (six) AM.   aspirin EC (ASPIRIN LOW DOSE) 81 MG tablet Take 1 tablet (81 mg total) by mouth daily. Swallow whole.   carvedilol (COREG) 6.25 MG tablet Take 1 tablet (6.25 mg total) by mouth 2 (two) times daily with a meal.   ELDERBERRY PO Take 100 mg by mouth daily.   ferrous sulfate 325 (65 FE) MG tablet Take 1 tablet (325 mg total) by mouth daily with breakfast.   furosemide (LASIX) 40 MG tablet TAKE 1 TABLET BY MOUTH ONCE DAILY   glucose blood (FREESTYLE TEST STRIPS) test strip Use as instructed   HUMALOG KWIKPEN 100 UNIT/ML KwikPen Inject into the skin. Sliding Scale   hydrALAZINE (APRESOLINE) 25 MG tablet TAKE 1 TABLET BY MOUTH IN THE MORNING, AND TAKE 1 TABLET AT BEDTIME    Insulin Pen Needle (NOVOFINE PEN NEEDLE) 32G X 6 MM MISC USE AS DIRECTED   JARDIANCE 10 MG TABS tablet TAKE ONE TABLET BY MOUTH DAILY BEFORE BREAKFAST   LEVEMIR FLEXTOUCH 100 UNIT/ML FlexPen Inject 8 Units into the skin 2 (two) times daily.   Probiotic Product (PROBIOTIC MULTI-ENZYME) TABS Take 3 tablets by mouth daily at 6 (six) AM.   rosuvastatin (CRESTOR) 40 MG tablet TAKE 1 TABLET BY MOUTH ONCE DAILY AT 6PM   sacubitril-valsartan (ENTRESTO) 97-103 MG TAKE ONE TABLET BY MOUTH TWO TIMES DAILY   spironolactone (ALDACTONE) 25 MG tablet TAKE 1 TABLET BY MOUTH ONCE DAILY   Turmeric 500 MG TABS Take 2 tablets by mouth daily at 6 (six) AM.   nitroGLYCERIN (NITROSTAT) 0.4 MG SL tablet Place 1 tablet (0.4 mg total) under the tongue every 5 (five) minutes as needed for chest pain.   No current facility-administered medications for this visit. (Other)   REVIEW OF SYSTEMS: ROS   Positive for: Endocrine, Cardiovascular, Eyes Negative for: Constitutional, Gastrointestinal, Neurological, Skin, Genitourinary, Musculoskeletal, HENT, Respiratory, Psychiatric, Allergic/Imm, Heme/Lymph Last edited by Lana Fish, COT on 06/17/2023 12:50 PM.      ALLERGIES No Known Allergies  PAST MEDICAL HISTORY Past Medical History:  Diagnosis Date   CAD in native artery 01/01/2021  Prior LAD PCI.  Currently no symptoms of ischemia.  Encouraged him to do some light exercising.  Continue aspirin, carvedilol, and rosuvastatin.   Cataract    CKD (chronic kidney disease), stage III (HCC) 08/24/2020   Coronary artery disease    Diabetes mellitus without complication (HCC)    on meds   Diabetic retinopathy (HCC)    Goals of care, counseling/discussion 07/01/2021   Hernia, inguinal    Hypertension    on meds   Hypertensive retinopathy    Lymphadenopathy, cervical 07/01/2021   MGUS (monoclonal gammopathy of unknown significance) 07/01/2021   NSVT (nonsustained ventricular tachycardia) (HCC) 08/24/2020   Pure  hypercholesterolemia 08/05/2021   Resistant hypertension 11/11/2018   Uncontrolled type 2 diabetes mellitus 11/11/2018   Past Surgical History:  Procedure Laterality Date   CORONARY STENT INTERVENTION N/A 11/15/2018   Procedure: CORONARY STENT INTERVENTION;  Surgeon: Corky Crafts, MD;  Location: [facility] INVASIVE CV LAB;  Service: Cardiovascular;  Laterality: N/A;   HEMORROIDECTOMY     RIGHT HEART CATH N/A 07/18/2020   Procedure: RIGHT HEART CATH;  Surgeon: Iran Ouch, MD;  Location: [facility] INVASIVE CV LAB;  Service: Cardiovascular;  Laterality: N/A;   RIGHT/LEFT HEART CATH AND CORONARY ANGIOGRAPHY N/A 11/15/2018   Procedure: RIGHT/LEFT HEART CATH AND CORONARY ANGIOGRAPHY;  Surgeon: Corky Crafts, MD;  Location: [facility] INVASIVE CV LAB;  Service: Cardiovascular;  Laterality: N/A;   WISDOM TOOTH EXTRACTION     FAMILY HISTORY Family History  Problem Relation Age of Onset   Hypertension Mother    Diabetes Mellitus II Mother    Arrhythmia Mother    Heart disease Mother    Hypertension Sister    Hypertension Brother    Heart disease Brother    Kidney disease Brother    Hypertension Other    Diabetes Mellitus II Other        All 9 sibblings have HTN   Colon cancer Neg Hx    Colon polyps Neg Hx    Esophageal cancer Neg Hx    Stomach cancer Neg Hx    Rectal cancer Neg Hx    SOCIAL HISTORY Social History   Tobacco Use   Smoking status: Never   Smokeless tobacco: Never  Vaping Use   Vaping status: Never Used  Substance Use Topics   Alcohol use: Not Currently   Drug use: Never       OPHTHALMIC EXAM:  Base Eye Exam     Visual Acuity (Snellen - Linear)       Right Left   Dist [place] 20/25 20/70-1   Dist ph [place] 20/NI 20/40-3         Tonometry (Tonopen, 12:52 PM)       Right Left   Pressure 18 16         Pupils       Dark Light Shape React APD   Right 3 2 Round Brisk None   Left 3 2 Round Brisk None         Visual Fields (Counting fingers)        Left Right    Full Full         Extraocular Movement       Right Left    Full, Ortho Full, Ortho         Neuro/Psych     Oriented x3: Yes   Mood/Affect: Normal         Dilation     Both eyes: 1.0% Mydriacyl, 2.5% Phenylephrine @  12:52 PM           Slit Lamp and Fundus Exam     Slit Lamp Exam       Right Left   Lids/Lashes Dermatochalasis - upper lid Dermatochalasis - upper lid   Conjunctiva/Sclera Melanosis Melanosis   Cornea arcus, fine endo pigment arcus, well healed cataract wound, trace PEE   Anterior Chamber deep and clear, no cell or flare deep, 0.5+cell / pigment   Iris Round and dilated, No NVI Round and dilated, No NVI   Lens 2-3+ Nuclear sclerosis, 2-3+ Cortical cataract PC IOL in good position   Anterior Vitreous Vitreous syneresis Vitreous syneresis         Fundus Exam       Right Left   Disc mild pallor, Sharp, no NVD, +PPP mild Pallor, Sharp rim, mild PPP, no NVD   C/D Ratio 0.6 0.5   Macula Flat, good foveal reflex, central edema -- improved, scattered MA/DBH -- improved, no exudates good foveal reflex, scattered MA/DBH, +cystic changes temporal macula -- slightly improved, no exudates or CWS   Vessels attenuated, tortuous, focal NV IN arcades -- regressing, mild copper wiring attenuated, Tortuous   Periphery Attached, 360 MA/DBH greatest posteriorly, light 360 PRP changes with room for fill in if needed Attached, 360 DBH           IMAGING AND PROCEDURES  Imaging and Procedures for 06/17/2023  OCT, Retina - OU - Both Eyes       Right Eye Quality was good. Central Foveal Thickness: 223. Progression has improved. Findings include no SRF, abnormal foveal contour, intraretinal hyper-reflective material, intraretinal fluid, vitreomacular adhesion (Interval improvement in IRF / cystic changes greatest nasal and central macula, partial PVD).   Left Eye Quality was good. Central Foveal Thickness: 202. Progression has improved. Findings  include normal foveal contour, no IRF, no SRF, intraretinal hyper-reflective material, vitreomacular adhesion (Interval improvement in IRF/IRHM temporal fovea and macula, just trace cystic changes remain, patchy ORA).   Notes *Images captured and stored on drive  Diagnosis / Impression:  +DME OU OD: Interval improvement in IRF / cystic changes greatest nasal and central macula, partial PVD OS: Interval improvement in IRF/IRHM temporal fovea and macula, just trace cystic changes remain, patchy ORA  Clinical management:  See below  Abbreviations: NFP - Normal foveal profile. CME - cystoid macular edema. PED - pigment epithelial detachment. IRF - intraretinal fluid. SRF - subretinal fluid. EZ - ellipsoid zone. ERM - epiretinal membrane. ORA - outer retinal atrophy. ORT - outer retinal tubulation. SRHM - subretinal hyper-reflective material. IRHM - intraretinal hyper-reflective material      Intravitreal Injection, Pharmacologic Agent - OS - Left Eye       Time Out 06/17/2023. 1:02 PM. Confirmed correct patient, procedure, site, and patient consented.   Anesthesia Topical anesthesia was used. Anesthetic medications included Lidocaine 2%, Proparacaine 0.5%.   Procedure Preparation included 5% betadine to ocular surface, eyelid speculum. A (32g) needle was used.   Injection: 2 mg aflibercept 2 MG/0.05ML   Route: Intravitreal, Site: Left Eye   NDC: L6038910, Lot: [id number], Expiration date: 07/31/2024, Waste: 0 mL   Post-op Post injection exam found visual acuity of at least counting fingers. The patient tolerated the procedure well. There were no complications. The patient received written and verbal post procedure care education. Post injection medications were not given.      Intravitreal Injection, Pharmacologic Agent - OD - Right Eye       Time Out  06/17/2023. 1:16 PM. Confirmed correct patient, procedure, site, and patient consented.   Anesthesia Topical anesthesia  was used. Anesthetic medications included Lidocaine 2%, Proparacaine 0.5%.   Procedure Preparation included 5% betadine to ocular surface, eyelid speculum. A (32g) needle was used.   Injection: 2 mg aflibercept 2 MG/0.05ML   Route: Intravitreal, Site: Right Eye   NDC: L6038910, Lot: [id number], Expiration date: 07/01/2024, Waste: 0 mL   Post-op Post injection exam found visual acuity of at least counting fingers. The patient tolerated the procedure well. There were no complications. The patient received written and verbal post procedure care education. Post injection medications were not given.            ASSESSMENT/PLAN:    ICD-10-CM   1. Proliferative diabetic retinopathy of right eye with macular edema associated with type 2 diabetes mellitus (HCC)  E11.3511 OCT, Retina - OU - Both Eyes    Intravitreal Injection, Pharmacologic Agent - OD - Right Eye    aflibercept (EYLEA) SOLN 2 mg    2. Severe nonproliferative diabetic retinopathy of left eye with macular edema associated with type 2 diabetes mellitus (HCC)  U98.1191 Intravitreal Injection, Pharmacologic Agent - OS - Left Eye    aflibercept (EYLEA) SOLN 2 mg    3. Current use of insulin (HCC)  Z79.4     4. Long term (current) use of oral hypoglycemic drugs  Z79.84     5. Essential hypertension  I10     6. Hypertensive retinopathy of both eyes  H35.033     7. Combined forms of age-related cataract of right eye  H25.811     8. Pseudophakia  Z96.1      1-4. Proliferative diabetic retinopathy OD  - severe NPDR OS - delayed to follow up from 10.28.22 to 01.04.23 (9+ wks) - s/p IVA OU #1 (06.06.22), #2 (07.08.22), #3 (08.05.22), #4 (09.02.22), #5 (09.30.22), #6 (10.28.22), #7 (01.04.23), #8 (02.20.33), #9 (04.03.23), #10 (05.15.23), #11 (06.28.23), #12 (08.16.23), #13 (10.10.23), #14 (11.14.23), #15 (01.15.24), #16 (02.26.24) , #17 (04.03.24), #18 (05.20.24) -- IVA resistance - s/p IVE OU #1 (06.24.24), #2 (07.29.24) -  s/p IVE OD #4 (09.03.24) - s/p IVE OS #3 (09.17.24) **h/o increased IRF/DME OU at 7 wks, noted on 08.16.23** - s/p PRP OD (06.10.22) - exam shows central edema OU, scattered IRH -- improving - FA (06.06.22) shows OD: focal NVE IN arcades; OS: no NV OS; late leaking MA's OU -- s/p PRP OD 6.10.22 - BCVA -- OD 20/25 (stable); OS 20/40 (stable) - OCT shows OD: Interval improvement in IRF / cystic changes greatest nasal and central macula at 6 wks, partial PVD; OS: Interval improvement in IRF/IRHM temporal fovea and macula, just trace cystic changes remain, patchy ORA at 4 weels - recommend IVE OU (OD #5 and OS #4) today, 10.16.24 w/ f/u in 5-6 wks - pt wishes to proceed - RBA of procedure discussed, questions answered - IVE informed consent obtained and signed (OU), 06.24.24 - see procedure note - Eylea approved for 2024 - f/u 5-6 weeks, DFE/OCT, possible injection OU  5,6. Hypertensive retinopathy OU - discussed importance of tight BP control - monitor  7. Mixed Cataract OD - The symptoms of cataract, surgical options, and treatments and risks were discussed with patient. - discussed diagnosis and progression - under the expert management of Dr. Zenaida Niece - clear from a retina standpoint to proceed with cataract surgery when pt and surgeon are ready   8. Pseudophakia OS  - s/p CE/IOL OS (Dr.  Zenaida Niece, 07.30.24)  - IOL in good position  - pt did not follow post op drop instructions immediately after surgery, but now compliant with post op drop instructions per Dr. Zenaida Niece  - monitor  Ophthalmic Meds Ordered this visit:  Meds ordered this encounter  Medications   aflibercept (EYLEA) SOLN 2 mg   aflibercept (EYLEA) SOLN 2 mg     Return for f/u 5-6 weeks, PDR OU, DFE, OCT.  There are no Patient Instructions on file for this visit.  This document serves as a record of services personally performed by Karie Chimera, MD, PhD. It was created on their behalf by De Blanch, an ophthalmic  technician. The creation of this record is the provider's dictation and/or activities during the visit.    Electronically signed by: De Blanch, OA, 06/17/23  11:20 PM  This document serves as a record of services personally performed by Karie Chimera, MD, PhD. It was created on their behalf by Glee Arvin. Manson Passey, OA an ophthalmic technician. The creation of this record is the provider's dictation and/or activities during the visit.    Electronically signed by: Glee Arvin. Manson Passey, OA 06/17/23 11:20 PM  Karie Chimera, M.D., Ph.D. Diseases & Surgery of the Retina and Vitreous [facility]  I have reviewed the above documentation for accuracy and completeness, and I agree with the above. Karie Chimera, M.D., Ph.D. 06/17/23 11:22 PM   Abbreviations: M myopia (nearsighted); A astigmatism; H hyperopia (farsighted); P presbyopia; Mrx spectacle prescription;  CTL contact lenses; OD right eye; OS left eye; OU both eyes  XT exotropia; ET esotropia; PEK punctate epithelial keratitis; PEE punctate epithelial erosions; DES dry eye syndrome; MGD meibomian gland dysfunction; ATs artificial tears; PFAT's preservative free artificial tears; NSC nuclear sclerotic cataract; PSC posterior subcapsular cataract; ERM epi-retinal membrane; PVD posterior vitreous detachment; RD retinal detachment; DM diabetes mellitus; DR diabetic retinopathy; NPDR non-proliferative diabetic retinopathy; PDR proliferative diabetic retinopathy; CSME clinically significant macular edema; DME diabetic macular edema; dbh dot blot hemorrhages; CWS cotton wool spot; POAG primary open angle glaucoma; C/D cup-to-disc ratio; HVF humphrey visual field; GVF goldmann visual field; OCT optical coherence tomography; IOP intraocular pressure; BRVO Branch retinal vein occlusion; CRVO central retinal vein occlusion; CRAO central retinal artery occlusion; BRAO branch retinal artery occlusion; RT retinal tear; SB scleral buckle; PPV  pars plana vitrectomy; VH Vitreous hemorrhage; PRP panretinal laser photocoagulation; IVK intravitreal kenalog; VMT vitreomacular traction; MH Macular hole;  NVD neovascularization of the disc; NVE neovascularization elsewhere; AREDS age related eye disease study; ARMD age related macular degeneration; POAG primary open angle glaucoma; EBMD epithelial/anterior basement membrane dystrophy; ACIOL anterior chamber intraocular lens; IOL intraocular lens; PCIOL posterior chamber intraocular lens; Phaco/IOL phacoemulsification with intraocular lens placement; PRK photorefractive keratectomy; LASIK laser assisted in situ keratomileusis; HTN hypertension; DM diabetes mellitus; COPD chronic obstructive pulmonary disease

## 2023-06-17 ENCOUNTER — Ambulatory Visit (INDEPENDENT_AMBULATORY_CARE_PROVIDER_SITE_OTHER): Payer: 59 | Admitting: Ophthalmology

## 2023-06-17 ENCOUNTER — Encounter (INDEPENDENT_AMBULATORY_CARE_PROVIDER_SITE_OTHER): Payer: Self-pay | Admitting: Ophthalmology

## 2023-06-17 DIAGNOSIS — H35033 Hypertensive retinopathy, bilateral: Secondary | ICD-10-CM | POA: Diagnosis not present

## 2023-06-17 DIAGNOSIS — Z794 Long term (current) use of insulin: Secondary | ICD-10-CM | POA: Diagnosis not present

## 2023-06-17 DIAGNOSIS — Z961 Presence of intraocular lens: Secondary | ICD-10-CM | POA: Diagnosis not present

## 2023-06-17 DIAGNOSIS — Z7984 Long term (current) use of oral hypoglycemic drugs: Secondary | ICD-10-CM

## 2023-06-17 DIAGNOSIS — E113412 Type 2 diabetes mellitus with severe nonproliferative diabetic retinopathy with macular edema, left eye: Secondary | ICD-10-CM

## 2023-06-17 DIAGNOSIS — I10 Essential (primary) hypertension: Secondary | ICD-10-CM

## 2023-06-17 DIAGNOSIS — E113511 Type 2 diabetes mellitus with proliferative diabetic retinopathy with macular edema, right eye: Secondary | ICD-10-CM | POA: Diagnosis not present

## 2023-06-17 DIAGNOSIS — H25811 Combined forms of age-related cataract, right eye: Secondary | ICD-10-CM | POA: Diagnosis not present

## 2023-06-17 MED ORDER — AFLIBERCEPT 2MG/0.05ML IZ SOLN FOR KALEIDOSCOPE
2.0000 mg | INTRAVITREAL | Status: AC | PRN
Start: 2023-06-17 — End: 2023-06-17
  Administered 2023-06-17: 2 mg via INTRAVITREAL

## 2023-06-26 ENCOUNTER — Other Ambulatory Visit: Payer: Self-pay | Admitting: Cardiovascular Disease

## 2023-06-26 ENCOUNTER — Other Ambulatory Visit: Payer: Self-pay

## 2023-06-26 MED ORDER — ASPIRIN 81 MG PO TBEC: 81.0000 mg | DELAYED_RELEASE_TABLET | Freq: Every day | ORAL | 2 refills | Status: AC

## 2023-06-30 DIAGNOSIS — K4090 Unilateral inguinal hernia, without obstruction or gangrene, not specified as recurrent: Secondary | ICD-10-CM | POA: Diagnosis not present

## 2023-06-30 DIAGNOSIS — Z79899 Other long term (current) drug therapy: Secondary | ICD-10-CM | POA: Diagnosis not present

## 2023-06-30 DIAGNOSIS — R0689 Other abnormalities of breathing: Secondary | ICD-10-CM | POA: Diagnosis not present

## 2023-06-30 DIAGNOSIS — Z0189 Encounter for other specified special examinations: Secondary | ICD-10-CM | POA: Diagnosis not present

## 2023-06-30 DIAGNOSIS — Z9842 Cataract extraction status, left eye: Secondary | ICD-10-CM | POA: Diagnosis not present

## 2023-06-30 DIAGNOSIS — E113513 Type 2 diabetes mellitus with proliferative diabetic retinopathy with macular edema, bilateral: Secondary | ICD-10-CM | POA: Diagnosis not present

## 2023-06-30 DIAGNOSIS — C9000 Multiple myeloma not having achieved remission: Secondary | ICD-10-CM | POA: Diagnosis not present

## 2023-06-30 DIAGNOSIS — Z0000 Encounter for general adult medical examination without abnormal findings: Secondary | ICD-10-CM | POA: Diagnosis not present

## 2023-06-30 DIAGNOSIS — I10 Essential (primary) hypertension: Secondary | ICD-10-CM | POA: Diagnosis not present

## 2023-07-28 ENCOUNTER — Encounter (INDEPENDENT_AMBULATORY_CARE_PROVIDER_SITE_OTHER): Payer: 59 | Admitting: Ophthalmology

## 2023-07-28 DIAGNOSIS — Z7984 Long term (current) use of oral hypoglycemic drugs: Secondary | ICD-10-CM

## 2023-07-28 DIAGNOSIS — Z794 Long term (current) use of insulin: Secondary | ICD-10-CM

## 2023-07-28 DIAGNOSIS — Z961 Presence of intraocular lens: Secondary | ICD-10-CM

## 2023-07-28 DIAGNOSIS — E113511 Type 2 diabetes mellitus with proliferative diabetic retinopathy with macular edema, right eye: Secondary | ICD-10-CM

## 2023-07-28 DIAGNOSIS — E113412 Type 2 diabetes mellitus with severe nonproliferative diabetic retinopathy with macular edema, left eye: Secondary | ICD-10-CM

## 2023-07-28 DIAGNOSIS — H25811 Combined forms of age-related cataract, right eye: Secondary | ICD-10-CM

## 2023-07-28 DIAGNOSIS — I10 Essential (primary) hypertension: Secondary | ICD-10-CM

## 2023-07-28 DIAGNOSIS — H35033 Hypertensive retinopathy, bilateral: Secondary | ICD-10-CM

## 2023-08-06 ENCOUNTER — Other Ambulatory Visit: Payer: Self-pay | Admitting: Cardiovascular Disease

## 2023-08-07 ENCOUNTER — Emergency Department (HOSPITAL_COMMUNITY): Payer: 59

## 2023-08-07 ENCOUNTER — Emergency Department (HOSPITAL_BASED_OUTPATIENT_CLINIC_OR_DEPARTMENT_OTHER): Payer: 59

## 2023-08-07 ENCOUNTER — Other Ambulatory Visit: Payer: Self-pay

## 2023-08-07 ENCOUNTER — Encounter (HOSPITAL_COMMUNITY): Payer: Self-pay

## 2023-08-07 ENCOUNTER — Inpatient Hospital Stay (HOSPITAL_COMMUNITY)
Admission: EM | Admit: 2023-08-07 | Discharge: 2023-09-03 | DRG: 239 | Disposition: A | Discharge status: Skilled Nursing Facility | Payer: 59 | Attending: Internal Medicine | Admitting: Internal Medicine

## 2023-08-07 DIAGNOSIS — E1169 Type 2 diabetes mellitus with other specified complication: Secondary | ICD-10-CM | POA: Diagnosis present

## 2023-08-07 DIAGNOSIS — L97519 Non-pressure chronic ulcer of other part of right foot with unspecified severity: Secondary | ICD-10-CM | POA: Diagnosis present

## 2023-08-07 DIAGNOSIS — E11621 Type 2 diabetes mellitus with foot ulcer: Secondary | ICD-10-CM | POA: Diagnosis present

## 2023-08-07 DIAGNOSIS — J9601 Acute respiratory failure with hypoxia: Secondary | ICD-10-CM | POA: Diagnosis present

## 2023-08-07 DIAGNOSIS — I10 Essential (primary) hypertension: Secondary | ICD-10-CM | POA: Diagnosis not present

## 2023-08-07 DIAGNOSIS — E11319 Type 2 diabetes mellitus with unspecified diabetic retinopathy without macular edema: Secondary | ICD-10-CM | POA: Diagnosis present

## 2023-08-07 DIAGNOSIS — N1832 Chronic kidney disease, stage 3b: Secondary | ICD-10-CM | POA: Diagnosis present

## 2023-08-07 DIAGNOSIS — I70248 Atherosclerosis of native arteries of left leg with ulceration of other part of lower left leg: Secondary | ICD-10-CM | POA: Diagnosis present

## 2023-08-07 DIAGNOSIS — I70238 Atherosclerosis of native arteries of right leg with ulceration of other part of lower right leg: Secondary | ICD-10-CM | POA: Diagnosis present

## 2023-08-07 DIAGNOSIS — L97521 Non-pressure chronic ulcer of other part of left foot limited to breakdown of skin: Secondary | ICD-10-CM

## 2023-08-07 DIAGNOSIS — E872 Acidosis, unspecified: Secondary | ICD-10-CM | POA: Diagnosis present

## 2023-08-07 DIAGNOSIS — F0390 Unspecified dementia without behavioral disturbance: Secondary | ICD-10-CM | POA: Diagnosis present

## 2023-08-07 DIAGNOSIS — Y92230 Patient room in hospital as the place of occurrence of the external cause: Secondary | ICD-10-CM | POA: Diagnosis not present

## 2023-08-07 DIAGNOSIS — E1122 Type 2 diabetes mellitus with diabetic chronic kidney disease: Secondary | ICD-10-CM | POA: Diagnosis present

## 2023-08-07 DIAGNOSIS — F05 Delirium due to known physiological condition: Secondary | ICD-10-CM | POA: Diagnosis not present

## 2023-08-07 DIAGNOSIS — R739 Hyperglycemia, unspecified: Secondary | ICD-10-CM

## 2023-08-07 DIAGNOSIS — L089 Local infection of the skin and subcutaneous tissue, unspecified: Secondary | ICD-10-CM

## 2023-08-07 DIAGNOSIS — L97509 Non-pressure chronic ulcer of other part of unspecified foot with unspecified severity: Secondary | ICD-10-CM

## 2023-08-07 DIAGNOSIS — L97529 Non-pressure chronic ulcer of other part of left foot with unspecified severity: Secondary | ICD-10-CM | POA: Diagnosis present

## 2023-08-07 DIAGNOSIS — I472 Ventricular tachycardia, unspecified: Secondary | ICD-10-CM | POA: Diagnosis present

## 2023-08-07 DIAGNOSIS — Z79899 Other long term (current) drug therapy: Secondary | ICD-10-CM

## 2023-08-07 DIAGNOSIS — L97821 Non-pressure chronic ulcer of other part of left lower leg limited to breakdown of skin: Secondary | ICD-10-CM | POA: Diagnosis present

## 2023-08-07 DIAGNOSIS — G9341 Metabolic encephalopathy: Secondary | ICD-10-CM | POA: Diagnosis present

## 2023-08-07 DIAGNOSIS — N5089 Other specified disorders of the male genital organs: Secondary | ICD-10-CM | POA: Diagnosis present

## 2023-08-07 DIAGNOSIS — I272 Pulmonary hypertension, unspecified: Secondary | ICD-10-CM | POA: Diagnosis present

## 2023-08-07 DIAGNOSIS — M6289 Other specified disorders of muscle: Secondary | ICD-10-CM | POA: Diagnosis not present

## 2023-08-07 DIAGNOSIS — E7800 Pure hypercholesterolemia, unspecified: Secondary | ICD-10-CM | POA: Diagnosis present

## 2023-08-07 DIAGNOSIS — E785 Hyperlipidemia, unspecified: Secondary | ICD-10-CM

## 2023-08-07 DIAGNOSIS — R410 Disorientation, unspecified: Secondary | ICD-10-CM | POA: Diagnosis not present

## 2023-08-07 DIAGNOSIS — I130 Hypertensive heart and chronic kidney disease with heart failure and stage 1 through stage 4 chronic kidney disease, or unspecified chronic kidney disease: Secondary | ICD-10-CM | POA: Diagnosis present

## 2023-08-07 DIAGNOSIS — L97511 Non-pressure chronic ulcer of other part of right foot limited to breakdown of skin: Secondary | ICD-10-CM

## 2023-08-07 DIAGNOSIS — Z23 Encounter for immunization: Secondary | ICD-10-CM | POA: Diagnosis present

## 2023-08-07 DIAGNOSIS — M869 Osteomyelitis, unspecified: Secondary | ICD-10-CM | POA: Diagnosis present

## 2023-08-07 DIAGNOSIS — I160 Hypertensive urgency: Secondary | ICD-10-CM | POA: Diagnosis not present

## 2023-08-07 DIAGNOSIS — Z89511 Acquired absence of right leg below knee: Secondary | ICD-10-CM | POA: Diagnosis not present

## 2023-08-07 DIAGNOSIS — N179 Acute kidney failure, unspecified: Principal | ICD-10-CM

## 2023-08-07 DIAGNOSIS — D472 Monoclonal gammopathy: Secondary | ICD-10-CM | POA: Diagnosis present

## 2023-08-07 DIAGNOSIS — E11628 Type 2 diabetes mellitus with other skin complications: Secondary | ICD-10-CM | POA: Diagnosis not present

## 2023-08-07 DIAGNOSIS — Z5986 Financial insecurity: Secondary | ICD-10-CM

## 2023-08-07 DIAGNOSIS — D62 Acute posthemorrhagic anemia: Secondary | ICD-10-CM | POA: Diagnosis not present

## 2023-08-07 DIAGNOSIS — I5042 Chronic combined systolic (congestive) and diastolic (congestive) heart failure: Secondary | ICD-10-CM | POA: Diagnosis present

## 2023-08-07 DIAGNOSIS — W010XXA Fall on same level from slipping, tripping and stumbling without subsequent striking against object, initial encounter: Secondary | ICD-10-CM | POA: Diagnosis not present

## 2023-08-07 DIAGNOSIS — I70223 Atherosclerosis of native arteries of extremities with rest pain, bilateral legs: Secondary | ICD-10-CM | POA: Diagnosis present

## 2023-08-07 DIAGNOSIS — L97811 Non-pressure chronic ulcer of other part of right lower leg limited to breakdown of skin: Secondary | ICD-10-CM | POA: Diagnosis present

## 2023-08-07 DIAGNOSIS — R7989 Other specified abnormal findings of blood chemistry: Secondary | ICD-10-CM | POA: Diagnosis not present

## 2023-08-07 DIAGNOSIS — Z794 Long term (current) use of insulin: Secondary | ICD-10-CM

## 2023-08-07 DIAGNOSIS — E1152 Type 2 diabetes mellitus with diabetic peripheral angiopathy with gangrene: Principal | ICD-10-CM

## 2023-08-07 DIAGNOSIS — I1A0 Resistant hypertension: Secondary | ICD-10-CM | POA: Diagnosis present

## 2023-08-07 DIAGNOSIS — T797XXA Traumatic subcutaneous emphysema, initial encounter: Secondary | ICD-10-CM | POA: Diagnosis present

## 2023-08-07 DIAGNOSIS — Z833 Family history of diabetes mellitus: Secondary | ICD-10-CM

## 2023-08-07 DIAGNOSIS — I5043 Acute on chronic combined systolic (congestive) and diastolic (congestive) heart failure: Secondary | ICD-10-CM | POA: Diagnosis present

## 2023-08-07 DIAGNOSIS — I2510 Atherosclerotic heart disease of native coronary artery without angina pectoris: Secondary | ICD-10-CM

## 2023-08-07 DIAGNOSIS — M7989 Other specified soft tissue disorders: Secondary | ICD-10-CM | POA: Diagnosis present

## 2023-08-07 DIAGNOSIS — N1831 Chronic kidney disease, stage 3a: Secondary | ICD-10-CM | POA: Diagnosis not present

## 2023-08-07 DIAGNOSIS — Z841 Family history of disorders of kidney and ureter: Secondary | ICD-10-CM

## 2023-08-07 DIAGNOSIS — C9000 Multiple myeloma not having achieved remission: Secondary | ICD-10-CM | POA: Diagnosis present

## 2023-08-07 DIAGNOSIS — I96 Gangrene, not elsewhere classified: Secondary | ICD-10-CM | POA: Diagnosis present

## 2023-08-07 DIAGNOSIS — Z7401 Bed confinement status: Secondary | ICD-10-CM

## 2023-08-07 DIAGNOSIS — R936 Abnormal findings on diagnostic imaging of limbs: Secondary | ICD-10-CM | POA: Diagnosis not present

## 2023-08-07 DIAGNOSIS — I998 Other disorder of circulatory system: Secondary | ICD-10-CM | POA: Diagnosis not present

## 2023-08-07 DIAGNOSIS — K5900 Constipation, unspecified: Secondary | ICD-10-CM | POA: Diagnosis present

## 2023-08-07 DIAGNOSIS — Z8249 Family history of ischemic heart disease and other diseases of the circulatory system: Secondary | ICD-10-CM

## 2023-08-07 DIAGNOSIS — Z89512 Acquired absence of left leg below knee: Secondary | ICD-10-CM | POA: Diagnosis not present

## 2023-08-07 DIAGNOSIS — E1165 Type 2 diabetes mellitus with hyperglycemia: Secondary | ICD-10-CM | POA: Diagnosis present

## 2023-08-07 DIAGNOSIS — I70235 Atherosclerosis of native arteries of right leg with ulceration of other part of foot: Secondary | ICD-10-CM | POA: Diagnosis not present

## 2023-08-07 DIAGNOSIS — E876 Hypokalemia: Secondary | ICD-10-CM | POA: Diagnosis not present

## 2023-08-07 DIAGNOSIS — I509 Heart failure, unspecified: Secondary | ICD-10-CM

## 2023-08-07 DIAGNOSIS — Z7982 Long term (current) use of aspirin: Secondary | ICD-10-CM

## 2023-08-07 DIAGNOSIS — I70245 Atherosclerosis of native arteries of left leg with ulceration of other part of foot: Secondary | ICD-10-CM | POA: Diagnosis not present

## 2023-08-07 DIAGNOSIS — Z7984 Long term (current) use of oral hypoglycemic drugs: Secondary | ICD-10-CM

## 2023-08-07 DIAGNOSIS — E11649 Type 2 diabetes mellitus with hypoglycemia without coma: Secondary | ICD-10-CM | POA: Diagnosis present

## 2023-08-07 DIAGNOSIS — R0902 Hypoxemia: Secondary | ICD-10-CM

## 2023-08-07 DIAGNOSIS — Z955 Presence of coronary angioplasty implant and graft: Secondary | ICD-10-CM

## 2023-08-07 LAB — CBG MONITORING, ED
Glucose-Capillary: 410 mg/dL — ABNORMAL HIGH (ref 70–99)
Glucose-Capillary: 417 mg/dL — ABNORMAL HIGH (ref 70–99)
Glucose-Capillary: 332 mg/dL — ABNORMAL HIGH (ref 70–99)

## 2023-08-07 LAB — VAS US ABI WITH/WO TBI
Left ABI: 1.05
Right ABI: 1.07

## 2023-08-07 LAB — URINALYSIS, COMPLETE (UACMP) WITH MICROSCOPIC
Bacteria, UA: NONE SEEN
Bilirubin Urine: NEGATIVE
Glucose, UA: >=500 mg/dL — AB
Ketones, ur: NEGATIVE mg/dL
Leukocytes,Ua: NEGATIVE
Nitrite: NEGATIVE
Protein, ur: 30 mg/dL — AB
Specific Gravity, Urine: 1.026 (ref 1.005–1.030)
pH: 5 (ref 5.0–8.0)

## 2023-08-07 LAB — I-STAT CG4 LACTIC ACID, ED
Lactic Acid, Venous: 2.6 mmol/L (ref 0.5–1.9)
Lactic Acid, Venous: 1.4 mmol/L (ref 0.5–1.9)

## 2023-08-07 LAB — TROPONIN I (HIGH SENSITIVITY)
Troponin I (High Sensitivity): 127 ng/L (ref <18)
Troponin I (High Sensitivity): 39 ng/L — ABNORMAL HIGH (ref <18)

## 2023-08-07 LAB — CBC
HCT: 31.4 % — ABNORMAL LOW (ref 39.0–52.0)
HCT: 27.1 % — ABNORMAL LOW (ref 39.0–52.0)
Hemoglobin: 10.2 g/dL — ABNORMAL LOW (ref 13.0–17.0)
Hemoglobin: 8.9 g/dL — ABNORMAL LOW (ref 13.0–17.0)
MCH: 30.1 pg (ref 26.0–34.0)
MCH: 30.4 pg (ref 26.0–34.0)
MCHC: 32.5 g/dL (ref 30.0–36.0)
MCHC: 32.8 g/dL (ref 30.0–36.0)
MCV: 92.6 fL (ref 80.0–100.0)
MCV: 92.5 fL (ref 80.0–100.0)
Platelets: 350 10*3/uL (ref 150–400)
Platelets: 373 10*3/uL (ref 150–400)
RBC: 3.39 MIL/uL — ABNORMAL LOW (ref 4.22–5.81)
RBC: 2.93 MIL/uL — ABNORMAL LOW (ref 4.22–5.81)
RDW: 12.2 % (ref 11.5–15.5)
RDW: 12 % (ref 11.5–15.5)
WBC: 14.6 10*3/uL — ABNORMAL HIGH (ref 4.0–10.5)
WBC: 12.9 10*3/uL — ABNORMAL HIGH (ref 4.0–10.5)
nRBC: 0 % (ref 0.0–0.2)
nRBC: 0 % (ref 0.0–0.2)

## 2023-08-07 LAB — BASIC METABOLIC PANEL
Anion gap: 12 (ref 5–15)
BUN: 22 mg/dL (ref 8–23)
CO2: 29 mmol/L (ref 22–32)
Calcium: 8.7 mg/dL — ABNORMAL LOW (ref 8.9–10.3)
Chloride: 94 mmol/L — ABNORMAL LOW (ref 98–111)
Creatinine, Ser: 1.78 mg/dL — ABNORMAL HIGH (ref 0.61–1.24)
GFR, Estimated: 41 mL/min — ABNORMAL LOW (ref >60)
Glucose, Bld: 525 mg/dL (ref 70–99)
Potassium: 3.6 mmol/L (ref 3.5–5.1)
Sodium: 135 mmol/L (ref 135–145)

## 2023-08-07 LAB — HEMOGLOBIN A1C
Hgb A1c MFr Bld: 9.1 % — ABNORMAL HIGH (ref 4.8–5.6)
Mean Plasma Glucose: 214.47 mg/dL

## 2023-08-07 LAB — CREATININE, URINE, RANDOM: Creatinine, Urine: 122 mg/dL

## 2023-08-07 LAB — SODIUM, URINE, RANDOM: Sodium, Ur: <10 mmol/L

## 2023-08-07 LAB — HIV ANTIBODY (ROUTINE TESTING W REFLEX): HIV Screen 4th Generation wRfx: NONREACTIVE

## 2023-08-07 LAB — BRAIN NATRIURETIC PEPTIDE: B Natriuretic Peptide: 200.5 pg/mL — ABNORMAL HIGH (ref 0.0–100.0)

## 2023-08-07 LAB — CREATININE, SERUM
Creatinine, Ser: 1.76 mg/dL — ABNORMAL HIGH (ref 0.61–1.24)
GFR, Estimated: 41 mL/min — ABNORMAL LOW (ref >60)

## 2023-08-07 MED ORDER — ENOXAPARIN SODIUM 40 MG/0.4ML IJ SOSY: 40.0000 mg | PREFILLED_SYRINGE | INTRAMUSCULAR | Status: DC

## 2023-08-07 MED ORDER — LACTATED RINGERS IV SOLN: INTRAVENOUS | Status: AC

## 2023-08-07 MED ORDER — ROSUVASTATIN CALCIUM 20 MG PO TABS
40.0000 mg | ORAL_TABLET | Freq: Every day | ORAL | Status: DC
  Administered 2023-08-07 – 2023-08-09 (×3): 40 mg via ORAL
  Filled 2023-08-07 (×3): qty 2

## 2023-08-07 MED ORDER — POLYETHYLENE GLYCOL 3350 17 G PO PACK
17.0000 g | PACK | Freq: Every day | ORAL | Status: DC | PRN
Start: 2023-08-07 — End: 2023-08-29
  Administered 2023-08-25 – 2023-08-29 (×2): 17 g via ORAL
  Filled 2023-08-07 (×2): qty 1

## 2023-08-07 MED ORDER — SODIUM CHLORIDE 0.9% FLUSH
3.0000 mL | Freq: Two times a day (BID) | INTRAVENOUS | Status: DC
Start: 2023-08-07 — End: 2023-09-03
  Administered 2023-08-07 – 2023-09-03 (×47): 3 mL via INTRAVENOUS

## 2023-08-07 MED ORDER — IOHEXOL 350 MG/ML SOLN
60.0000 mL | Freq: Once | INTRAVENOUS | Status: AC | PRN
  Administered 2023-08-07: 60 mL via INTRAVENOUS

## 2023-08-07 MED ORDER — INSULIN ASPART 100 UNIT/ML IJ SOLN
10.0000 [IU] | Freq: Once | INTRAMUSCULAR | Status: AC
  Administered 2023-08-07: 10 [IU] via INTRAVENOUS

## 2023-08-07 MED ORDER — ASPIRIN 81 MG PO TBEC
81.0000 mg | DELAYED_RELEASE_TABLET | Freq: Every day | ORAL | Status: DC
  Administered 2023-08-09 – 2023-09-03 (×24): 81 mg via ORAL
  Filled 2023-08-07 (×25): qty 1

## 2023-08-07 MED ORDER — SODIUM CHLORIDE 0.9 % IV BOLUS
500.0000 mL | Freq: Once | INTRAVENOUS | Status: AC
  Administered 2023-08-07: 500 mL via INTRAVENOUS

## 2023-08-07 MED ORDER — ACETAMINOPHEN 650 MG RE SUPP: 650.0000 mg | Freq: Four times a day (QID) | RECTAL | Status: DC | PRN

## 2023-08-07 MED ORDER — PIPERACILLIN-TAZOBACTAM 3.375 G IVPB
3.3750 g | Freq: Three times a day (TID) | INTRAVENOUS | Status: DC
  Administered 2023-08-08 – 2023-08-09 (×4): 3.375 g via INTRAVENOUS
  Filled 2023-08-07 (×4): qty 50

## 2023-08-07 MED ORDER — INSULIN ASPART 100 UNIT/ML IJ SOLN
0.0000 [IU] | Freq: Every day | INTRAMUSCULAR | Status: DC
  Administered 2023-08-07 – 2023-08-08 (×2): 4 [IU] via SUBCUTANEOUS

## 2023-08-07 MED ORDER — VANCOMYCIN HCL 1.5 G IV SOLR
1500.0000 mg | Freq: Once | INTRAVENOUS | Status: AC
  Administered 2023-08-07: 1500 mg via INTRAVENOUS
  Filled 2023-08-07: qty 30

## 2023-08-07 MED ORDER — INSULIN DETEMIR 100 UNIT/ML FLEXPEN: 8.0000 [IU] | PEN_INJECTOR | Freq: Two times a day (BID) | SUBCUTANEOUS | Status: DC

## 2023-08-07 MED ORDER — INSULIN ASPART 100 UNIT/ML IJ SOLN
0.0000 [IU] | Freq: Three times a day (TID) | INTRAMUSCULAR | Status: DC
  Administered 2023-08-09: 2 [IU] via SUBCUTANEOUS

## 2023-08-07 MED ORDER — ACETAMINOPHEN 325 MG PO TABS
650.0000 mg | ORAL_TABLET | Freq: Four times a day (QID) | ORAL | Status: DC | PRN
  Administered 2023-08-09 – 2023-08-29 (×12): 650 mg via ORAL
  Filled 2023-08-07 (×12): qty 2

## 2023-08-07 MED ORDER — CARVEDILOL 3.125 MG PO TABS
3.1250 mg | ORAL_TABLET | Freq: Two times a day (BID) | ORAL | Status: DC
  Administered 2023-08-09 – 2023-08-10 (×3): 3.125 mg via ORAL
  Filled 2023-08-07 (×3): qty 1

## 2023-08-07 MED ORDER — SODIUM CHLORIDE 0.9 % IV SOLN
1.0000 g | Freq: Once | INTRAVENOUS | Status: AC
  Administered 2023-08-07: 1 g via INTRAVENOUS
  Filled 2023-08-07: qty 10

## 2023-08-07 MED ORDER — INSULIN DETEMIR 100 UNIT/ML ~~LOC~~ SOLN
8.0000 [IU] | Freq: Two times a day (BID) | SUBCUTANEOUS | Status: DC
  Administered 2023-08-07 – 2023-08-17 (×16): 8 [IU] via SUBCUTANEOUS
  Filled 2023-08-07 (×21): qty 0.08

## 2023-08-07 MED ORDER — TETANUS-DIPHTH-ACELL PERTUSSIS 5-2.5-18.5 LF-MCG/0.5 IM SUSY
0.5000 mL | PREFILLED_SYRINGE | Freq: Once | INTRAMUSCULAR | Status: AC
  Administered 2023-08-07: 0.5 mL via INTRAMUSCULAR
  Filled 2023-08-07: qty 0.5

## 2023-08-07 NOTE — ED Notes (Signed)
Patient transported to CT 

## 2023-08-07 NOTE — ED Triage Notes (Addendum)
Pt BIB EMS from [facility]. Chest pain with SHOB 2 weeks ago and got worse today. Pt states PCP changed meds and pt has been non complaint with meds. Non radiating 5/10 pain. UC gave 324mg  of aspirin. VSS axox4. EMS states pt had reciprocal depression on 12 lead.

## 2023-08-07 NOTE — Progress Notes (Signed)
Pharmacy Antibiotic Note  Earl Calderon is a 72 y.o. male admitted on 08/07/2023 with  wound infection - gangrene of bilateral lower extremities .  Pharmacy has been consulted for Zosyn (piperacillin-tazobactam) and Vancomycin dosing.  Patient received Ceftriaxone 1g IV x1 in ED   WBC 12.9, afebrile SCr 1.76  Plan: Zosyn 3.375g IV q8h extended-infusion Initiate loading dose of Vancomycin 1500mg  IV x 1, followed by  Vancomycin 750mg  IV q24h (eAUC ~470)    > Goal AUC 400-550    > Check vancomycin levels at steady state  Monitor daily CBC, temp, SCr, and for clinical signs of improvement  F/u cultures and de-escalate antibiotics as able    Height: 5' 9.5" (176.5 cm) Weight: 64.9 kg (143 lb) (last documented weight) IBW/kg (Calculated) : 71.85  Temp (24hrs), Avg:98.3 F (36.8 C), Min:98 F (36.7 C), Max:98.8 F (37.1 C)  Recent Labs  Lab 08/07/23 1102 08/07/23 1239 08/07/23 1449 08/07/23 2131  WBC 14.6*  --   --  12.9*  CREATININE 1.78*  --   --  1.76*  LATICACIDVEN  --  2.6* 1.4  --     Estimated Creatinine Clearance: 35.9 mL/min (A) (by C-G formula based on SCr of 1.76 mg/dL (H)).    No Known Allergies  Antimicrobials this admission: Ceftriaxone 12/6 x1 Vancomycin 12/6 >>  Zosyn 12/6 >>   Dose adjustments this admission: N/A  Microbiology results: 12/6 Bcx x2: sent   Thank you for allowing pharmacy to be a part of this patient's care.  Wilburn Cornelia, PharmD, BCPS Clinical Pharmacist 08/07/2023 10:46 PM   Please refer to [facility] for pharmacy phone number

## 2023-08-07 NOTE — ED Notes (Signed)
Pt returned from ultrasound; pt's wife updated; pt speaking on phone with wife

## 2023-08-07 NOTE — ED Provider Notes (Cosign Needed Addendum)
[place] EMERGENCY DEPARTMENT AT [facility] Provider Note   CSN: [id number] Arrival date & time: 08/07/23  1100     History  Chief Complaint  Patient presents with   Chest Pain    Earl Calderon is a 72 y.o. male history of MGUS, LBBB, CHF, CAD, NSVT, diabetes presented with chest pain shortness of breath for the past 2 weeks.  Patient states that it has gotten worse and he went to his primary care provider today he was referred to the ED for further workup.  Patient states that any breath feels like his last.  Patient denies any previous history of blood clots and is not on any blood thinners.  Patient does not normally wear oxygen at home however is requiring oxygen here.  Patient also notes that both of his feet have wounds from yardwork.  Patient states he has been peeling off the skin and that there has been purulent drainage.  Patient states he still feels feet and move and states that these wounds are nonpainful.  Patient is unsure of last tetanus.  Patient states he does not smoke.  Home Medications Prior to Admission medications   Medication Sig Start Date End Date Taking? Authorizing Provider  APPLE CIDER VINEGAR PO Take 1 tablet by mouth daily at 6 (six) AM.    [provider]  aspirin EC (ASPIRIN LOW DOSE) 81 MG tablet Take 1 tablet (81 mg total) by mouth daily. Swallow whole. 06/26/23   Chilton Si, MD  carvedilol (COREG) 6.25 MG tablet Take 1 tablet (6.25 mg total) by mouth 2 (two) times daily with a meal. 12/29/22   Chilton Si, MD  ELDERBERRY PO Take 100 mg by mouth daily.    [provider]  ferrous sulfate 325 (65 FE) MG tablet Take 1 tablet (325 mg total) by mouth daily with breakfast. 11/11/21   Chilton Si, MD  furosemide (LASIX) 40 MG tablet TAKE 1 TABLET BY MOUTH ONCE DAILY 10/10/21   Alver Sorrow, NP  glucose blood (FREESTYLE TEST STRIPS) test strip Use as instructed 10/21/20   Grayce Sessions, NP  HUMALOG  KWIKPEN 100 UNIT/ML KwikPen Inject into the skin. Sliding Scale 06/13/22   [provider]  hydrALAZINE (APRESOLINE) 25 MG tablet TAKE 1 TABLET BY MOUTH IN THE MORNING, AND TAKE 1 TABLET AT BEDTIME 05/18/23   Chilton Si, MD  Insulin Pen Needle (NOVOFINE PEN NEEDLE) 32G X 6 MM MISC USE AS DIRECTED 01/11/21   Claiborne Rigg, NP  JARDIANCE 10 MG TABS tablet TAKE ONE TABLET BY MOUTH DAILY BEFORE BREAKFAST 05/12/22   Chilton Si, MD  LEVEMIR FLEXTOUCH 100 UNIT/ML FlexPen Inject 8 Units into the skin 2 (two) times daily. 01/02/21   Hoy Register, MD  nitroGLYCERIN (NITROSTAT) 0.4 MG SL tablet Place 1 tablet (0.4 mg total) under the tongue every 5 (five) minutes as needed for chest pain. 10/22/20 08/05/21  Marcelino Duster, PA  Probiotic Product (PROBIOTIC MULTI-ENZYME) TABS Take 3 tablets by mouth daily at 6 (six) AM.    [provider]  rosuvastatin (CRESTOR) 40 MG tablet TAKE 1 TABLET BY MOUTH ONCE DAILY AT [facility] 04/30/23   Chilton Si, MD  sacubitril-valsartan (ENTRESTO) 97-103 MG TAKE ONE TABLET BY MOUTH TWO TIMES DAILY 05/28/23   Chilton Si, MD  spironolactone (ALDACTONE) 25 MG tablet TAKE 1 TABLET BY MOUTH ONCE DAILY 08/06/23   Chilton Si, MD  Turmeric 500 MG TABS Take 2 tablets by mouth daily at 6 (six) AM.  [provider]      Allergies    Patient has no known allergies.    Review of Systems   Review of Systems  Cardiovascular:  Positive for chest pain.    Physical Exam Updated Vital Signs BP (!) 145/71   Pulse (!) 54   Temp 98 F (36.7 C) (Rectal)   Resp 16   SpO2 100%  Physical Exam Constitutional:      General: He is not in acute distress. Cardiovascular:     Rate and Rhythm: Normal rate and regular rhythm.     Pulses: Normal pulses.     Heart sounds: Normal heart sounds.     Comments: Unable to palpate DP/PT pulses and so will get doppler to confirm Pulmonary:     Effort: No respiratory distress.     Comments: Not  able to speak in full sentences On nasal cannula Abdominal:     Palpations: Abdomen is soft.     Tenderness: There is no abdominal tenderness. There is no guarding or rebound.  Musculoskeletal:     Right lower leg: Tenderness present. No edema.     Left lower leg: Tenderness present. No edema.  Skin:    General: Skin is warm and dry.     Comments: Bilateral feet have significant sized ulcerations with some concern for gangrenous lesions, no bony protrusions, purulent material was noted  Neurological:     Mental Status: He is alert and oriented to person, place, and time.  Psychiatric:        Mood and Affect: Mood normal.        ED Results / Procedures / Treatments   Labs (all labs ordered are listed, but only abnormal results are displayed) Labs Reviewed  BASIC METABOLIC PANEL - Abnormal; Notable for the following components:      Result Value   Chloride 94 (*)    Glucose, Bld 525 (*)    Creatinine, Ser 1.78 (*)    Calcium 8.7 (*)    GFR, Estimated 41 (*)    All other components within normal limits  CBC - Abnormal; Notable for the following components:   WBC 14.6 (*)    RBC 3.39 (*)    Hemoglobin 10.2 (*)    HCT 31.4 (*)    All other components within normal limits  BRAIN NATRIURETIC PEPTIDE - Abnormal; Notable for the following components:   B Natriuretic Peptide 200.5 (*)    All other components within normal limits  I-STAT CG4 LACTIC ACID, ED - Abnormal; Notable for the following components:   Lactic Acid, Venous 2.6 (*)    All other components within normal limits  CBG MONITORING, ED - Abnormal; Notable for the following components:   Glucose-Capillary 410 (*)    All other components within normal limits  TROPONIN I (HIGH SENSITIVITY) - Abnormal; Notable for the following components:   Troponin I (High Sensitivity) 127 (*)    All other components within normal limits  TROPONIN I (HIGH SENSITIVITY) - Abnormal; Notable for the following components:   Troponin I  (High Sensitivity) 39 (*)    All other components within normal limits  I-STAT CG4 LACTIC ACID, ED    EKG EKG Interpretation Date/Time:  Friday August 07 2023 11:26:21 EST Ventricular Rate:  54 PR Interval:  131 QRS Duration:  156 QT Interval:  550 QTC Calculation: 522 R Axis:   97  Text Interpretation: Sinus or ectopic atrial rhythm Multiple ventricular premature complexes Anterior infarct, acute (LAD) Prolonged  QT interval No significant change since last tracing Confirmed by Jacalyn Lefevre [phone]) on 08/07/2023 11:54:37 AM  Radiology DG Chest Port 1 View  Result Date: 08/07/2023 CLINICAL DATA:  Chest pain. EXAM: PORTABLE CHEST 1 VIEW COMPARISON:  07/09/2020. FINDINGS: Bilateral lung fields are clear. Note is made of elevated right hemidiaphragm. Bilateral costophrenic angles are clear. Normal cardio-mediastinal silhouette. No acute osseous abnormalities. The soft tissues are within normal limits. IMPRESSION: No active disease. Electronically Signed   By: Jules Schick M.D.   On: 08/07/2023 12:21    Procedures .Critical Care  Performed by: Netta Corrigan, PA-C Authorized by: Netta Corrigan, PA-C   Critical care provider statement:    Critical care time (minutes):  50   Critical care time was exclusive of:  Separately billable procedures and treating other patients   Critical care was necessary to treat or prevent imminent or life-threatening deterioration of the following conditions:  Respiratory failure   Critical care was time spent personally by me on the following activities:  Blood draw for specimens, development of treatment plan with patient or surrogate, evaluation of patient's response to treatment, examination of patient, obtaining history from patient or surrogate, review of old charts, re-evaluation of patient's condition, pulse oximetry, ordering and review of radiographic studies, ordering and review of laboratory studies and ordering and performing treatments  and interventions   I assumed direction of critical care for this patient from another provider in my specialty: no     Care discussed with comment:  Oncoming provider     Medications Ordered in ED Medications  Tdap (BOOSTRIX) injection 0.5 mL (0.5 mLs Intramuscular Given 08/07/23 1208)  sodium chloride 0.9 % bolus 500 mL (0 mLs Intravenous Stopped 08/07/23 1415)  iohexol (OMNIPAQUE) 350 MG/ML injection 60 mL (60 mLs Intravenous Contrast Given 08/07/23 1439)    ED Course/ Medical Decision Making/ A&P                                 Medical Decision Making Amount and/or Complexity of Data Reviewed Labs: ordered. Radiology: ordered.  Risk Prescription drug management.   [name] 72 y.o. presented today for chest pain. Working DDx that I considered at this time includes, but not limited to, ACS, GERD, pulmonary embolism, community-acquired pneumonia, aortic dissection, pneumothorax, underlying bony abnormality, anemia, thyrotoxicosis, esophageal rupture, CHF exacerbation, valvular disorder, myocarditis, pericarditis, endocarditis, pericardial effusion/cardiac tamponade, pulmonary edema, gastritis/PUD, esophagitis, limb ischemia, gangrenous lesion, PVD/PAD.  R/o Dx: Pending  Review of prior external notes: 06/17/2023 office visit  Unique Tests and My Interpretation:  EKG: Sinus 54 bpm, left bundle branch block that has been seen previously, multiple PVCs, prolonged QT, no signs of ischemia Troponin: 127, 39 CXR: No acute findings CBC: Leukocytosis 14.6 BMP: Hyperglycemia 525, AKI creatinine 1.78, GFR 41 Lactic acid: 2.6, 1.4 CTA chest PE: Pending ABI bilateral lower extremities: Pending  Social Determinants of Health: none  Discussion with Independent Historian: None  Discussion of Management of Tests: None  Risk: High: hospitalization or escalation of hospital-level care  Risk Stratification Score: None  Staffed with [place], MD  Plan: On exam patient was  in no acute distress with stable vitals. Patient's physical was remarkable for extensive ulcers on both feet with purulent drainage and concern for possible gangrenous lesions. Labs and CXR will be ordered.  CTA chest was ordered to rule out PE as patient is high risk along with CTAs of bilateral lower  extremities to rule out ischemia given patient's physical exam.  Patient does have history of diabetes and so these could be diabetic foot ulcers as well.  Tetanus will be updated.  Patient does not currently have chest pain.  Patient stable at this time.  Due to patient's poor renal function we cannot do the CT angio lower extremities and so we will do the CTA chest for PE as this is more acute.  Will put in for ABIs of bilateral extremities as do suspect there is significant vascular disease in the peripheries.  Patient's sugars are also slightly elevated as well along with increased lactic acid and so patient was given 500 of fluids as patient's last echo shows that he has an LVEF of 45 to 50%.  Do anticipate admission given presentation along with AKI.  Patient signed out to [facility], PA-C.  Please review their note for the continuation of patient's care.  The plan at this point is follow-up on labs and imaging and admit for AKI, suspected severe vascular disease in the peripheries.  This chart was dictated using voice recognition software.  Despite best efforts to proofread,  errors can occur which can change the documentation meaning.         Final Clinical Impression(s) / ED Diagnoses Final diagnoses:  AKI (acute kidney injury) (HCC)  Ulcer of both feet, limited to breakdown of skin [facility])    Rx / DC Orders ED Discharge Orders     None         Earl Calderon 08/07/23 1516    Netta Corrigan, PA-C 08/07/23 1552    Jacalyn Lefevre, MD 08/08/23 [phone]

## 2023-08-07 NOTE — Assessment & Plan Note (Addendum)
On admission through 12-15, 2024, Patient baseline creatinine between 1.06 and 1.31.  Current creatinine 1.78.  Without associated elevated BUN.  I suspect this may be related to infection and hyperglycemia.  Patient received 500 cc normal saline fluid bolus in the ER.  Check urinalysis sodium creatinine.  Trend creatinine response to 75 cc/h fluid hydration overnight  08-17-2023 Scr 1.49. at baseline.  08-19-2023 will repeat CMP today. As pt not taking in a lot of po fluids. 08-20-2023 Scr stable at 1.52 08-21-2023 Scr 1.38 today. I think restarting aldactone and Entresto back when his AKI is recovering would only get him back into trouble. He is not taking much in the way of PO liquids. May be due to him being in the hospital. I think the right management is to get him home first and see what he does with eating/drinking. Then have him see cardiology in clinic to restart GDMT. He will need close monitoring of his renal function if Entresto and/or aldactone are restarted.  08-22-2023 get labs today.  HR has improved with HR in 70s. BP still elevated. Could add coreg today.  08-23-2023 pt did receive dose of lasix that was meant for PRBC transfusion(that he did not receive yesterday). Scr up to 1.39 as expected. Pt is agreeable to PRBC transfusion today. Hopefully his renal perfusion will improve with PRBC transfusion and Scr will continue to trend down.

## 2023-08-07 NOTE — Consult Note (Incomplete)
[facility] NOTE   MRN : [id number]  Reason for Consult: Dry gangrene B Foot wounds Referring Physician: ED  History of Present Illness: 72 y/o male with past medical history of CKD, DM and CHF was found to have bilateral foot wounds that appear chronic. MD reports that the patient is a poor historian.    He came to the ED secondary to SOB.       Current Facility-Administered Medications  Medication Dose Route Frequency Provider Last Rate Last Admin   [START ON 08/08/2023] insulin aspart (novoLOG) injection 0-15 Units  0-15 Units Subcutaneous TID WC Nolberto Hanlon, MD       insulin aspart (novoLOG) injection 0-5 Units  0-5 Units Subcutaneous QHS Nolberto Hanlon, MD       insulin detemir (LEVEMIR) injection 8 Units  8 Units Subcutaneous BID Edwin Dada P, DO       lactated ringers infusion   Intravenous Continuous Nolberto Hanlon, MD       Current Outpatient Medications  Medication Sig Dispense Refill   APPLE CIDER VINEGAR PO Take 1 tablet by mouth daily at 6 (six) AM.     aspirin EC (ASPIRIN LOW DOSE) 81 MG tablet Take 1 tablet (81 mg total) by mouth daily. Swallow whole. 90 tablet 2   carvedilol (COREG) 6.25 MG tablet Take 1 tablet (6.25 mg total) by mouth 2 (two) times daily with a meal. 180 tablet 3   ELDERBERRY PO Take 100 mg by mouth daily.     ferrous sulfate 325 (65 FE) MG tablet Take 1 tablet (325 mg total) by mouth daily with breakfast. 90 tablet 3   furosemide (LASIX) 40 MG tablet TAKE 1 TABLET BY MOUTH ONCE DAILY 90 tablet 3   glucose blood (FREESTYLE TEST STRIPS) test strip Use as instructed 100 each 12   HUMALOG KWIKPEN 100 UNIT/ML KwikPen Inject into the skin. Sliding Scale     hydrALAZINE (APRESOLINE) 25 MG tablet TAKE 1 TABLET BY MOUTH IN THE MORNING, AND TAKE 1 TABLET AT BEDTIME 180 tablet 2   Insulin Pen Needle (NOVOFINE PEN NEEDLE) 32G X 6 MM MISC USE AS DIRECTED 100 each 6   JARDIANCE 10 MG TABS tablet TAKE ONE TABLET BY MOUTH DAILY  BEFORE BREAKFAST 90 tablet 2   LEVEMIR FLEXTOUCH 100 UNIT/ML FlexPen Inject 8 Units into the skin 2 (two) times daily. 15 mL 1   nitroGLYCERIN (NITROSTAT) 0.4 MG SL tablet Place 1 tablet (0.4 mg total) under the tongue every 5 (five) minutes as needed for chest pain. 25 tablet 3   Probiotic Product (PROBIOTIC MULTI-ENZYME) TABS Take 3 tablets by mouth daily at 6 (six) AM.     rosuvastatin (CRESTOR) 40 MG tablet TAKE 1 TABLET BY MOUTH ONCE DAILY AT 6PM 90 tablet 2   sacubitril-valsartan (ENTRESTO) 97-103 MG TAKE ONE TABLET BY MOUTH TWO TIMES DAILY 180 tablet 3   spironolactone (ALDACTONE) 25 MG tablet TAKE 1 TABLET BY MOUTH ONCE DAILY 90 tablet 3   Turmeric 500 MG TABS Take 2 tablets by mouth daily at 6 (six) AM.      Pt meds include: Statin :No Betablocker: No ASA: No Other anticoagulants/antiplatelets: none  Past Medical History:  Diagnosis Date   CAD in native artery 01/01/2021   Prior LAD PCI.  Currently no symptoms of ischemia.  Encouraged him to do some light exercising.  Continue aspirin, carvedilol, and rosuvastatin.   Cataract    CKD (chronic kidney disease), stage III (HCC) 08/24/2020  Coronary artery disease    Diabetes mellitus without complication (HCC)    on meds   Diabetic retinopathy (HCC)    Goals of care, counseling/discussion 07/01/2021   Hernia, inguinal    Hypertension    on meds   Hypertensive retinopathy    Lymphadenopathy, cervical 07/01/2021   MGUS (monoclonal gammopathy of unknown significance) 07/01/2021   NSVT (nonsustained ventricular tachycardia) (HCC) 08/24/2020   Pure hypercholesterolemia 08/05/2021   Resistant hypertension 11/11/2018   Uncontrolled type 2 diabetes mellitus 11/11/2018    Past Surgical History:  Procedure Laterality Date   CORONARY STENT INTERVENTION N/A 11/15/2018   Procedure: CORONARY STENT INTERVENTION;  Surgeon: Corky Crafts, MD;  Location: [facility] INVASIVE CV LAB;  Service: Cardiovascular;  Laterality: N/A;    HEMORROIDECTOMY     RIGHT HEART CATH N/A 07/18/2020   Procedure: RIGHT HEART CATH;  Surgeon: Iran Ouch, MD;  Location: [facility] INVASIVE CV LAB;  Service: Cardiovascular;  Laterality: N/A;   RIGHT/LEFT HEART CATH AND CORONARY ANGIOGRAPHY N/A 11/15/2018   Procedure: RIGHT/LEFT HEART CATH AND CORONARY ANGIOGRAPHY;  Surgeon: Corky Crafts, MD;  Location: [facility] INVASIVE CV LAB;  Service: Cardiovascular;  Laterality: N/A;   WISDOM TOOTH EXTRACTION      Social History Social History   Tobacco Use   Smoking status: Never   Smokeless tobacco: Never  Vaping Use   Vaping status: Never Used  Substance Use Topics   Alcohol use: Not Currently   Drug use: Never    Family History Family History  Problem Relation Age of Onset   Hypertension Mother    Diabetes Mellitus II Mother    Arrhythmia Mother    Heart disease Mother    Hypertension Sister    Hypertension Brother    Heart disease Brother    Kidney disease Brother    Hypertension Other    Diabetes Mellitus II Other        All 9 sibblings have HTN   Colon cancer Neg Hx    Colon polyps Neg Hx    Esophageal cancer Neg Hx    Stomach cancer Neg Hx    Rectal cancer Neg Hx     No Known Allergies   REVIEW OF SYSTEMS  General: [ ]  Weight loss, [ ]  Fever, [ ]  chills Neurologic: [ ]  Dizziness, [ ]  Blackouts, [ ]  Seizure [ ]  Stroke, [ ]  "Mini stroke", [ ]  Slurred speech, [ ]  Temporary blindness; [ ]  weakness in arms or legs, [ ]  Hoarseness [ ]  Dysphagia Cardiac: [ ]  Chest pain/pressure, [ ]  Shortness of breath at rest [ ]  Shortness of breath with exertion, [ ]  Atrial fibrillation or irregular heartbeat  Vascular: [ ]  Pain in legs with walking, [ ]  Pain in legs at rest, [ ]  Pain in legs at night,  [ ]  Non-healing ulcer, [ ]  Blood clot in vein/DVT,   Pulmonary: [ ]  Home oxygen, [ ]  Productive cough, [ ]  Coughing up blood, [ ]  Asthma,  [ ]  Wheezing [ ]  COPD Musculoskeletal:  [ ]  Arthritis, [ ]  Low back pain, [ ]  Joint  pain Hematologic: [ ]  Easy Bruising, [ ]  Anemia; [ ]  Hepatitis Gastrointestinal: [ ]  Blood in stool, [ ]  Gastroesophageal Reflux/heartburn, Urinary: [ ]  chronic Kidney disease, [ ]  on HD - [ ]  MWF or [ ]  TTHS, [ ]  Burning with urination, [ ]  Difficulty urinating Skin: [ ]  Rashes, [ ]  Wounds Psychological: [ ]  Anxiety, [ ]  Depression  Physical Examination Vitals:  08/07/23 1739 08/07/23 1830 08/07/23 1844 08/07/23 1900  BP:  109/68  111/62  Pulse:  (!) 55  (!) 53  Resp:  17 19 13   Temp: 98.2 F (36.8 C)     TempSrc: Oral     SpO2:   97% 99%   There is no height or weight on file to calculate BMI.  General:  WDWN in NAD Gait: Normal HENT: WNL Eyes: Pupils equal Pulmonary: normal non-labored breathing , without Rales, rhonchi,  wheezing Cardiac: RRR, without  Murmurs, rubs or gallops; No carotid bruits Abdomen: soft, NT, no masses Skin: no rashes, ulcers noted;  positive Gangrene , no cellulitis; no open wounds;      Vascular Exam/Pulses:***   Musculoskeletal: no muscle wasting or atrophy; no edema  Neurologic: A&O X 3; Appropriate Affect ;  SENSATION: normal; MOTOR FUNCTION: 5/5 Symmetric Speech is fluent/normal   Significant Diagnostic Studies: CBC Lab Results  Component Value Date   WBC 14.6 (H) 08/07/2023   HGB 10.2 (L) 08/07/2023   HCT 31.4 (L) 08/07/2023   MCV 92.6 08/07/2023   PLT 350 08/07/2023    BMET    Component Value Date/Time   NA 135 08/07/2023 1102   NA 148 (H) 11/25/2021 1521   K 3.6 08/07/2023 1102   CL 94 (L) 08/07/2023 1102   CO2 29 08/07/2023 1102   GLUCOSE 525 (HH) 08/07/2023 1102   BUN 22 08/07/2023 1102   BUN 19 11/25/2021 1521   CREATININE 1.78 (H) 08/07/2023 1102   CREATININE 1.28 (H) 12/25/2022 1001   CALCIUM 8.7 (L) 08/07/2023 1102   GFRNONAA 41 (L) 08/07/2023 1102   GFRNONAA >60 12/25/2022 1001   GFRAA 78 09/26/2020 1142   CrCl cannot be calculated (Unknown ideal weight.).  COAG Lab Results  Component Value Date    INR 1.2 11/11/2018     Non-Invasive Vascular Imaging:  ABI Findings:  +---------+------------------+-----+-------------------+--------+  Right   Rt Pressure (mmHg)IndexWaveform           Comment   +---------+------------------+-----+-------------------+--------+  Brachial 128                    biphasic                     +---------+------------------+-----+-------------------+--------+  PTA     137               1.07 audibly multiphasic          +---------+------------------+-----+-------------------+--------+  DP      125               0.98 audibly multiphasic          +---------+------------------+-----+-------------------+--------+  Great Toe67                0.52 Abnormal                     +---------+------------------+-----+-------------------+--------+   +---------+------------------+-----+-------------------+-------+  Left    Lt Pressure (mmHg)IndexWaveform           Comment  +---------+------------------+-----+-------------------+-------+  Brachial 116                    triphasic                   +---------+------------------+-----+-------------------+-------+  PTA     135               1.05 audibly multiphasic         +---------+------------------+-----+-------------------+-------+  DP      126               0.98 audibly multiphasic         +---------+------------------+-----+-------------------+-------+  Great Toe51                0.40 Abnormal                    +---------+------------------+-----+-------------------+-------+   +-------+-----------+-----------+------------+------------+  ABI/TBIToday's ABIToday's TBIPrevious ABIPrevious TBI  +-------+-----------+-----------+------------+------------+  Right 1.07       0.52                                 +-------+-----------+-----------+------------+------------+  Left  1.05       0.40                                  +-------+-----------+-----------+------------+------------+    Accurate doppler signals difficult to obtain due to venous interference  and hyperemic flow from infection.    Summary:  Right: Resting right ankle-brachial index is within normal range. The  right toe-brachial index is abnormal.   Left: Resting left ankle-brachial index is within normal range. The left  toe-brachial index is abnormal.   Left Foot x ray IMPRESSION: Soft tissue wound/ulcer along the plantar surface of the heel, adjacent to the calcaneus.   Possible subtle cortical lucency along the anterior calcaneus versus talus, equivocal. Osteomyelitis is not excluded.  EXAM: RIGHT FOOT - 2 VIEW   COMPARISON:  None Available.   FINDINGS: Small soft tissue ulcer along the medial aspect of the 1st MTP joint.   No associated adjacent cortical destruction to suggest osteomyelitis.   Mild soft tissue swelling/edema, most prominent along the dorsal forefoot.   No fracture or dislocation is seen.   Vascular calcifications.   IMPRESSION: Small soft tissue ulcer along the medial aspect of the 1st MTP joint. No radiographic findings of osteomyelitis.  ASSESSMENT/PLAN: ***   Mosetta Pigeon 08/07/2023 8:37 PM

## 2023-08-07 NOTE — ED Notes (Signed)
ED TO INPATIENT HANDOFF REPORT  ED Nurse Name and Phone #: [phone]  S Name/Age/Gender Earl Calderon 72 y.o. male Room/Bed: 033C/033C  Code Status   Code Status: Full Code  Home/SNF/Other Home Patient oriented to: self, place, time, and situation Is this baseline? Yes   Triage Complete: Triage complete  Chief Complaint Gangrene [facility]) [I96]  Triage Note Pt BIB EMS from [facility]. Chest pain with SHOB 2 weeks ago and got worse today. Pt states PCP changed meds and pt has been non complaint with meds. Non radiating 5/10 pain. UC gave 324mg  of aspirin. VSS axox4. EMS states pt had reciprocal depression on 12 lead.    Allergies No Known Allergies  Level of Care/Admitting Diagnosis ED Disposition     ED Disposition  Admit   Condition  --   Comment  Hospital Area: [facility] [[id number]]  Level of Care: Telemetry Medical [104]  May admit patient to [facility] or [facility] if equivalent level of care is available:: No  Covid Evaluation: Asymptomatic - no recent exposure (last 10 days) testing not required  Diagnosis: Gangrene (HCC) Pauli.Sours.4.ICD-9-CM]  Admitting Physician: Nolberto Hanlon [[id number]]  Attending Physician: Nolberto Hanlon [[id number]]  Certification:: I certify this patient will need inpatient services for at least 2 midnights  Expected Medical Readiness: 08/10/2023          B Medical/Surgery History Past Medical History:  Diagnosis Date   CAD in native artery 01/01/2021   Prior LAD PCI.  Currently no symptoms of ischemia.  Encouraged him to do some light exercising.  Continue aspirin, carvedilol, and rosuvastatin.   Cataract    CKD (chronic kidney disease), stage III (HCC) 08/24/2020   Coronary artery disease    Diabetes mellitus without complication (HCC)    on meds   Diabetic retinopathy (HCC)    Goals of care, counseling/discussion 07/01/2021   Hernia, inguinal    Hypertension    on meds   Hypertensive retinopathy    Lymphadenopathy,  cervical 07/01/2021   MGUS (monoclonal gammopathy of unknown significance) 07/01/2021   NSVT (nonsustained ventricular tachycardia) (HCC) 08/24/2020   Pure hypercholesterolemia 08/05/2021   Resistant hypertension 11/11/2018   Uncontrolled type 2 diabetes mellitus 11/11/2018   Past Surgical History:  Procedure Laterality Date   CORONARY STENT INTERVENTION N/A 11/15/2018   Procedure: CORONARY STENT INTERVENTION;  Surgeon: Corky Crafts, MD;  Location: [facility] INVASIVE CV LAB;  Service: Cardiovascular;  Laterality: N/A;   HEMORROIDECTOMY     RIGHT HEART CATH N/A 07/18/2020   Procedure: RIGHT HEART CATH;  Surgeon: Iran Ouch, MD;  Location: [facility] INVASIVE CV LAB;  Service: Cardiovascular;  Laterality: N/A;   RIGHT/LEFT HEART CATH AND CORONARY ANGIOGRAPHY N/A 11/15/2018   Procedure: RIGHT/LEFT HEART CATH AND CORONARY ANGIOGRAPHY;  Surgeon: Corky Crafts, MD;  Location: [facility] INVASIVE CV LAB;  Service: Cardiovascular;  Laterality: N/A;   WISDOM TOOTH EXTRACTION       A IV Location/Drains/Wounds Patient Lines/Drains/Airways Status     Active Line/Drains/Airways     Name Placement date Placement time Site Days   Peripheral IV 07/09/20 Left Wrist 07/09/20  2155  Wrist  1124   Peripheral IV 08/07/23 20 G Right Antecubital 08/07/23  1139  Antecubital  less than 1   Wound / Incision (Open or Dehisced) 07/10/20 Diabetic ulcer Leg Bilateral;Lower 07/10/20  1100  Leg  1123            Intake/Output Last 24 hours No intake or output data in the  24 hours ending 08/07/23 2103  Labs/Imaging Results for orders placed or performed during the hospital encounter of 08/07/23 (from the past 48 hour(s))  Basic metabolic panel     Status: Abnormal   Collection Time: 08/07/23 11:02 AM  Result Value Ref Range   Sodium 135 135 - 145 mmol/L   Potassium 3.6 3.5 - 5.1 mmol/L   Chloride 94 (L) 98 - 111 mmol/L   CO2 29 22 - 32 mmol/L   Glucose, Bld 525 (HH) 70 - 99 mg/dL    Comment: CRITICAL  RESULT CALLED TO, READ BACK BY AND VERIFIED WITH Loistine Simas VAN RN @1304  12.06.2024 E.AHMED Glucose reference range applies only to samples taken after fasting for at least 8 hours.    BUN 22 8 - 23 mg/dL   Creatinine, Ser 2.44 (H) 0.61 - 1.24 mg/dL   Calcium 8.7 (L) 8.9 - 10.3 mg/dL   GFR, Estimated 41 (L) >60 mL/min    Comment: (NOTE) Calculated using the CKD-EPI Creatinine Equation (2021)    Anion gap 12 5 - 15    Comment: Performed at [facility], [address], [place], [place] [address]  CBC     Status: Abnormal   Collection Time: 08/07/23 11:02 AM  Result Value Ref Range   WBC 14.6 (H) 4.0 - 10.5 K/uL   RBC 3.39 (L) 4.22 - 5.81 MIL/uL   Hemoglobin 10.2 (L) 13.0 - 17.0 g/dL   HCT 25.3 (L) 66.4 - 40.3 %   MCV 92.6 80.0 - 100.0 fL   MCH 30.1 26.0 - 34.0 pg   MCHC 32.5 30.0 - 36.0 g/dL   RDW 47.4 25.9 - 56.3 %   Platelets 350 150 - 400 K/uL   nRBC 0.0 0.0 - 0.2 %    Comment: Performed at [facility], [address], [place], [place] [address]  Troponin I (High Sensitivity)     Status: Abnormal   Collection Time: 08/07/23 11:02 AM  Result Value Ref Range   Troponin I (High Sensitivity) 127 (HH) <18 ng/L    Comment: CRITICAL RESULT CALLED TO, READ BACK BY AND VERIFIED WITH Farrel Gobble RN @1304  12.06.2024 E.AHMED (NOTE) Elevated high sensitivity troponin I (hsTnI) values and significant  changes across serial measurements may suggest ACS but many other  chronic and acute conditions are known to elevate hsTnI results.  Refer to the "Links" section for chest pain algorithms and additional  guidance. Performed at [facility], [address] [place], [place] [address]   Brain natriuretic peptide     Status: Abnormal   Collection Time: 08/07/23 11:28 AM  Result Value Ref Range   B Natriuretic Peptide 200.5 (H) 0.0 - 100.0 pg/mL    Comment: Performed at [facility], [address], [place], [place] [address]  I-Stat Lactic Acid     Status: Abnormal    Collection Time: 08/07/23 12:39 PM  Result Value Ref Range   Lactic Acid, Venous 2.6 (HH) 0.5 - 1.9 mmol/L   Comment NOTIFIED PHYSICIAN   Troponin I (High Sensitivity)     Status: Abnormal   Collection Time: 08/07/23  1:02 PM  Result Value Ref Range   Troponin I (High Sensitivity) 39 (H) <18 ng/L    Comment: DELTA CHECK NOTED (NOTE) Elevated high sensitivity troponin I (hsTnI) values and significant  changes across serial measurements may suggest ACS but many other  chronic and acute conditions are known to elevate hsTnI results.  Refer to the "  Links" section for chest pain algorithms and additional  guidance. Performed at [facility], [address] [place], [place] [address]   CBG monitoring, ED     Status: Abnormal   Collection Time: 08/07/23  2:42 PM  Result Value Ref Range   Glucose-Capillary 410 (H) 70 - 99 mg/dL    Comment: Glucose reference range applies only to samples taken after fasting for at least 8 hours.  I-Stat Lactic Acid     Status: None   Collection Time: 08/07/23  2:49 PM  Result Value Ref Range   Lactic Acid, Venous 1.4 0.5 - 1.9 mmol/L  CBG monitoring, ED     Status: Abnormal   Collection Time: 08/07/23  5:36 PM  Result Value Ref Range   Glucose-Capillary 417 (H) 70 - 99 mg/dL    Comment: Glucose reference range applies only to samples taken after fasting for at least 8 hours.   DG Foot 2 Views Right  Result Date: 08/07/2023 CLINICAL DATA:  Chronic foot wounds, concern for osteo EXAM: RIGHT FOOT - 2 VIEW COMPARISON:  None Available. FINDINGS: Small soft tissue ulcer along the medial aspect of the 1st MTP joint. No associated adjacent cortical destruction to suggest osteomyelitis. Mild soft tissue swelling/edema, most prominent along the dorsal forefoot. No fracture or dislocation is seen. Vascular calcifications. IMPRESSION: Small soft tissue ulcer along the medial aspect of the 1st MTP joint. No radiographic findings of osteomyelitis. Electronically  Signed   By: Charline Bills M.D.   On: 08/07/2023 18:27   DG Foot 2 Views Left  Result Date: 08/07/2023 CLINICAL DATA:  Chronic foot wounds, concern for osteo EXAM: LEFT FOOT - 2 VIEW COMPARISON:  None Available. FINDINGS: Soft tissue wound/ulcer along the plantar surface of the heel, adjacent to the calcaneus. Flattening of the talus with pes planus. Subtle cortical lucency medially along the anterior talus versus anterior process of the calcaneus on the frontal radiograph. While this could reflect osteomyelitis, it is not well visualized on the lateral view. Superimposed soft tissue gas related to the large ulcer could also lead to this appearance. No fracture is seen. Mild to moderate dorsal soft tissue swelling. IMPRESSION: Soft tissue wound/ulcer along the plantar surface of the heel, adjacent to the calcaneus. Possible subtle cortical lucency along the anterior calcaneus versus talus, equivocal. Osteomyelitis is not excluded. Electronically Signed   By: Charline Bills M.D.   On: 08/07/2023 18:24   VAS [place] ABI WITH/WO TBI  Result Date: 08/07/2023  LOWER EXTREMITY DOPPLER STUDY Patient Name:  Earl Calderon  Date of Exam:   08/07/2023 Medical Rec #: [id number]          Accession #:    [id number] Date of Birth: 1951/08/19          Patient Gender: M Patient Age:   12 years Exam Location:  [facility] Procedure:      VAS [place] ABI WITH/WO TBI Referring Phys: Evlyn Kanner --------------------------------------------------------------------------------  Indications: BLE DM foot infection/wounds High Risk Factors: Hypertension, hyperlipidemia, Diabetes, no history of                    smoking, coronary artery disease. Other Factors: CHF,.  Limitations: Today's exam was limited due to venous interference and hyperemic              flow. Comparison Study: No previous exams Performing Technologist: Ernestene Mention RVT/RDMS  Examination Guidelines: A complete evaluation includes at minimum, Doppler waveform  signals and systolic  blood pressure reading at the level of bilateral brachial, anterior tibial, and posterior tibial arteries, when vessel segments are accessible. Bilateral testing is considered an integral part of a complete examination. Photoelectric Plethysmograph (PPG) waveforms and toe systolic pressure readings are included as required and additional duplex testing as needed. Limited examinations for reoccurring indications may be performed as noted.  ABI Findings: +---------+------------------+-----+-------------------+--------+ Right    Rt Pressure (mmHg)IndexWaveform           Comment  +---------+------------------+-----+-------------------+--------+ Brachial 128                    biphasic                    +---------+------------------+-----+-------------------+--------+ PTA      137               1.07 audibly multiphasic         +---------+------------------+-----+-------------------+--------+ DP       125               0.98 audibly multiphasic         +---------+------------------+-----+-------------------+--------+ Great Toe67                0.52 Abnormal                    +---------+------------------+-----+-------------------+--------+ +---------+------------------+-----+-------------------+-------+ Left     Lt Pressure (mmHg)IndexWaveform           Comment +---------+------------------+-----+-------------------+-------+ Brachial 116                    triphasic                  +---------+------------------+-----+-------------------+-------+ PTA      135               1.05 audibly multiphasic        +---------+------------------+-----+-------------------+-------+ DP       126               0.98 audibly multiphasic        +---------+------------------+-----+-------------------+-------+ Great Toe51                0.40 Abnormal                   +---------+------------------+-----+-------------------+-------+  +-------+-----------+-----------+------------+------------+ ABI/TBIToday's ABIToday's TBIPrevious ABIPrevious TBI +-------+-----------+-----------+------------+------------+ Right  1.07       0.52                                +-------+-----------+-----------+------------+------------+ Left   1.05       0.40                                +-------+-----------+-----------+------------+------------+ Accurate doppler signals difficult to obtain due to venous interference and hyperemic flow from infection.  Summary: Right: Resting right ankle-brachial index is within normal range. The right toe-brachial index is abnormal. Left: Resting left ankle-brachial index is within normal range. The left toe-brachial index is abnormal. *See table(s) above for measurements and observations.  Electronically signed by Carolynn Sayers on 08/07/2023 at 4:54:52 PM.    Final    CT Angio Chest PE W and/or Wo Contrast  Result Date: 08/07/2023 CLINICAL DATA:  Chest pain. EXAM: CT ANGIOGRAPHY CHEST WITH CONTRAST TECHNIQUE: Multidetector CT imaging of the chest was performed using the standard protocol during bolus administration  of intravenous contrast. Multiplanar CT image reconstructions and MIPs were obtained to evaluate the vascular anatomy. RADIATION DOSE REDUCTION: This exam was performed according to the departmental dose-optimization program which includes automated exposure control, adjustment of the mA and/or kV according to patient size and/or use of iterative reconstruction technique. CONTRAST:  60mL OMNIPAQUE IOHEXOL 350 MG/ML SOLN COMPARISON:  July 19, 2021. FINDINGS: Cardiovascular: Satisfactory opacification of the pulmonary arteries to the segmental level. No evidence of pulmonary embolism. Normal heart size. No pericardial effusion. Coronary artery calcifications are noted. Mediastinum/Nodes: No enlarged mediastinal, hilar, or axillary lymph nodes. Thyroid gland, trachea, and esophagus demonstrate no  significant findings. Lungs/Pleura: Lungs are clear. No pleural effusion or pneumothorax. Upper Abdomen: No acute abnormality. Musculoskeletal: No chest wall abnormality. No acute or significant osseous findings. Review of the MIP images confirms the above findings. IMPRESSION: No definite evidence of pulmonary embolus. Coronary artery calcifications are noted suggesting coronary artery disease. Aortic Atherosclerosis (ICD10-I70.0). Electronically Signed   By: Lupita Raider M.D.   On: 08/07/2023 15:49   DG Chest Port 1 View  Result Date: 08/07/2023 CLINICAL DATA:  Chest pain. EXAM: PORTABLE CHEST 1 VIEW COMPARISON:  07/09/2020. FINDINGS: Bilateral lung fields are clear. Note is made of elevated right hemidiaphragm. Bilateral costophrenic angles are clear. Normal cardio-mediastinal silhouette. No acute osseous abnormalities. The soft tissues are within normal limits. IMPRESSION: No active disease. Electronically Signed   By: Jules Schick M.D.   On: 08/07/2023 12:21    Pending Labs Unresulted Labs (From admission, onward)     Start     Ordered   08/14/23 0500  Creatinine, serum  (enoxaparin (LOVENOX)    CrCl >/= 30 ml/min)  Weekly,   R     Comments: while on enoxaparin therapy    08/07/23 2044   08/08/23 0500  Hepatic function panel  Tomorrow morning,   R        08/07/23 2029   08/08/23 0500  APTT  Tomorrow morning,   R        08/07/23 2044   08/08/23 0500  Protime-INR  Tomorrow morning,   R        08/07/23 2044   08/08/23 0500  Basic metabolic panel  Tomorrow morning,   R        08/07/23 2044   08/08/23 0500  CBC  Tomorrow morning,   R        08/07/23 2044   08/07/23 2043  CBC  (enoxaparin (LOVENOX)    CrCl >/= 30 ml/min)  Once,   R       Comments: Baseline for enoxaparin therapy IF NOT ALREADY DRAWN.  Notify MD if PLT < 100 K.    08/07/23 2044   08/07/23 2043  Creatinine, serum  (enoxaparin (LOVENOX)    CrCl >/= 30 ml/min)  Once,   R       Comments: Baseline for enoxaparin therapy IF  NOT ALREADY DRAWN.    08/07/23 2044   08/07/23 2043  HIV Antibody (routine testing w rflx)  (HIV Antibody (Routine testing w reflex) panel)  Once,   R        08/07/23 2044   08/07/23 2015  Urinalysis, Complete w Microscopic -Urine, Clean Catch  Once,   URGENT       Question:  Specimen Source  Answer:  Urine, Clean Catch   08/07/23 2014   08/07/23 2015  Creatinine, urine, random  Once,   URGENT        08/07/23  2014   08/07/23 2015  Sodium, urine, random  Once,   URGENT        08/07/23 2014   08/07/23 2012  Hemoglobin A1c  Once,   URGENT       Comments: To assess prior glycemic control    08/07/23 2011   08/07/23 2009  Culture, blood (Routine X 2) w Reflex to ID Panel  BLOOD CULTURE X 2,   R (with STAT occurrences)      08/07/23 2008            Vitals/Pain Today's Vitals   08/07/23 1739 08/07/23 1830 08/07/23 1844 08/07/23 1900  BP:  109/68  111/62  Pulse:  (!) 55  (!) 53  Resp:  17 19 13   Temp: 98.2 F (36.8 C)     TempSrc: Oral     SpO2:   97% 99%  PainSc:        Isolation Precautions No active isolations  Medications Medications  insulin aspart (novoLOG) injection 0-15 Units (has no administration in time range)  insulin aspart (novoLOG) injection 0-5 Units (has no administration in time range)  insulin detemir (LEVEMIR) injection 8 Units (has no administration in time range)  lactated ringers infusion (has no administration in time range)  aspirin EC tablet 81 mg (has no administration in time range)  rosuvastatin (CRESTOR) tablet 40 mg (has no administration in time range)  carvedilol (COREG) tablet 3.125 mg (has no administration in time range)  enoxaparin (LOVENOX) injection 40 mg (has no administration in time range)  acetaminophen (TYLENOL) tablet 650 mg (has no administration in time range)    Or  acetaminophen (TYLENOL) suppository 650 mg (has no administration in time range)  polyethylene glycol (MIRALAX / GLYCOLAX) packet 17 g (has no administration in  time range)  sodium chloride flush (NS) 0.9 % injection 3 mL (has no administration in time range)  Tdap (BOOSTRIX) injection 0.5 mL (0.5 mLs Intramuscular Given 08/07/23 1208)  sodium chloride 0.9 % bolus 500 mL (0 mLs Intravenous Stopped 08/07/23 1415)  iohexol (OMNIPAQUE) 350 MG/ML injection 60 mL (60 mLs Intravenous Contrast Given 08/07/23 1439)  insulin aspart (novoLOG) injection 10 Units (10 Units Intravenous Given 08/07/23 1739)  cefTRIAXone (ROCEPHIN) 1 g in sodium chloride 0.9 % 100 mL IVPB (0 g Intravenous Stopped 08/07/23 1848)    Mobility walks with device     Focused Assessments  R Recommendations: See Admitting Provider Note  Report given to:   Additional Notes:

## 2023-08-07 NOTE — Assessment & Plan Note (Addendum)
On admission, Associated with hyperglycemia, without evidence of DKA.  Glucose on presentation was 525.  Likely worsened by infection and possible nonadherence to regimen.  Patient received 10 units of subcutaneous insulin in the ER.  I will continue with patient's remotely documented Levemir 8 units twice daily as well as insulin sliding scale ACHS moderate.  We will trend this.  08-17-2023 on bid levemir and SSI. CBG in mid 200s. Will increase levemir to 10 units BID.   08-19-2023 CBG in acceptable ranges. Continue with levemir 10 units BID. Hold if CBG < 110  08-20-2023 CBG acceptable  08-21-2023 to 08-23-2023 CBG acceptable ranges

## 2023-08-07 NOTE — Assessment & Plan Note (Addendum)
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  He has an IgG kappa spike. To me, I still think that his diabetes can be a much bigger problem than myeloma will be. Hopefully, he will be able to get the blood sugar under control. We will have to follow him up.  We will have to get him back in about 2 or 3 months for follow-up. Hopefully, his blood sugars would not cause problems for him that we will end him up in the hospital."  08-17-2023 stable.

## 2023-08-07 NOTE — Progress Notes (Signed)
ABI has been completed   Results can be found under chart review under CV PROC. 08/07/2023 4:51 PM Dawt Reeb RVT, RDMS

## 2023-08-07 NOTE — H&P (Signed)
History and Physical    Patient: Earl Calderon FAO:[id number] DOB: 02/20/1951 DOA: 08/07/2023 DOS: the patient was seen and examined on 08/07/2023 PCP: Melvenia Beam, MD  Patient coming from: Home  Chief Complaint:  Chief Complaint  Patient presents with   Chest Pain   HPI: Earl Calderon is a 72 y.o. male with medical history significant of CKD, DM, MGUS and documented CHF in chart.  EF was 49% in May, 2024. LVH.   On review of chart, I do not notice any pulmonary pathology being documented.  Patient reports that his baseline functional status is 1 of getting "tired "after doing yard work in the house.  Otherwise he is able to get up and walk around the house without any trouble breathing.  However for the last 2 weeks patient describes a progressive sensation of shortness of breath.  Initially present with exertion and now patient states he is feeling short of breath even at rest.  This prompted the patient to come to the hospital today.  Patient further reports generalized anterior chest discomfort with efforts of breathing.  Patient does not report any fever or any pleuritic chest pain or any cough or leg swelling.  Patient's reason for coming to the hospital are as above per patient.  Further patient reports that he has had chronic leg "sores" for several weeks time, they have been worsening on both lower extremities for the last couple of weeks.  And they are more painful.  Further report is obtained from the ER provider.  Asher Muir Barrett reports that the patient was found to be hypoxic on attempts at ambulation.  However he is not hypoxic at rest.  Oxygen has been given as needed.  Troponins have been mildly elevated, medical evaluation is sought. Review of Systems: Unable to review all systems due to lack of cooperation from patient.  Patient wanted to not answer more for questions or engage in prolonged encounter.  Therefore review of system was not possible similarly exam  was limited. Past Medical History:  Diagnosis Date   CAD in native artery 01/01/2021   Prior LAD PCI.  Currently no symptoms of ischemia.  Encouraged him to do some light exercising.  Continue aspirin, carvedilol, and rosuvastatin.   Cataract    CKD (chronic kidney disease), stage III (HCC) 08/24/2020   Coronary artery disease    Diabetes mellitus without complication (HCC)    on meds   Diabetic retinopathy (HCC)    Goals of care, counseling/discussion 07/01/2021   Hernia, inguinal    Hypertension    on meds   Hypertensive retinopathy    Lymphadenopathy, cervical 07/01/2021   MGUS (monoclonal gammopathy of unknown significance) 07/01/2021   NSVT (nonsustained ventricular tachycardia) (HCC) 08/24/2020   Pure hypercholesterolemia 08/05/2021   Resistant hypertension 11/11/2018   Uncontrolled type 2 diabetes mellitus 11/11/2018   Past Surgical History:  Procedure Laterality Date   CORONARY STENT INTERVENTION N/A 11/15/2018   Procedure: CORONARY STENT INTERVENTION;  Surgeon: Corky Crafts, MD;  Location: [facility] INVASIVE CV LAB;  Service: Cardiovascular;  Laterality: N/A;   HEMORROIDECTOMY     RIGHT HEART CATH N/A 07/18/2020   Procedure: RIGHT HEART CATH;  Surgeon: Iran Ouch, MD;  Location: [facility] INVASIVE CV LAB;  Service: Cardiovascular;  Laterality: N/A;   RIGHT/LEFT HEART CATH AND CORONARY ANGIOGRAPHY N/A 11/15/2018   Procedure: RIGHT/LEFT HEART CATH AND CORONARY ANGIOGRAPHY;  Surgeon: Corky Crafts, MD;  Location: [facility] INVASIVE CV LAB;  Service: Cardiovascular;  Laterality: N/A;  WISDOM TOOTH EXTRACTION     Social History:  reports that he has never smoked. He has never used smokeless tobacco. He reports that he does not currently use alcohol. He reports that he does not use drugs.  No Known Allergies  Family History  Problem Relation Age of Onset   Hypertension Mother    Diabetes Mellitus II Mother    Arrhythmia Mother    Heart disease Mother    Hypertension  Sister    Hypertension Brother    Heart disease Brother    Kidney disease Brother    Hypertension Other    Diabetes Mellitus II Other        All 9 sibblings have HTN   Colon cancer Neg Hx    Colon polyps Neg Hx    Esophageal cancer Neg Hx    Stomach cancer Neg Hx    Rectal cancer Neg Hx     Prior to Admission medications   Medication Sig Start Date End Date Taking? Authorizing Provider  APPLE CIDER VINEGAR PO Take 1 tablet by mouth daily at 6 (six) AM.    [provider]  aspirin EC (ASPIRIN LOW DOSE) 81 MG tablet Take 1 tablet (81 mg total) by mouth daily. Swallow whole. 06/26/23   Chilton Si, MD  carvedilol (COREG) 6.25 MG tablet Take 1 tablet (6.25 mg total) by mouth 2 (two) times daily with a meal. 12/29/22   Chilton Si, MD  ELDERBERRY PO Take 100 mg by mouth daily.    [provider]  ferrous sulfate 325 (65 FE) MG tablet Take 1 tablet (325 mg total) by mouth daily with breakfast. 11/11/21   Chilton Si, MD  furosemide (LASIX) 40 MG tablet TAKE 1 TABLET BY MOUTH ONCE DAILY 10/10/21   Alver Sorrow, NP  glucose blood (FREESTYLE TEST STRIPS) test strip Use as instructed 10/21/20   Grayce Sessions, NP  HUMALOG KWIKPEN 100 UNIT/ML KwikPen Inject into the skin. Sliding Scale 06/13/22   [provider]  hydrALAZINE (APRESOLINE) 25 MG tablet TAKE 1 TABLET BY MOUTH IN THE MORNING, AND TAKE 1 TABLET AT BEDTIME 05/18/23   Chilton Si, MD  Insulin Pen Needle (NOVOFINE PEN NEEDLE) 32G X 6 MM MISC USE AS DIRECTED 01/11/21   Claiborne Rigg, NP  JARDIANCE 10 MG TABS tablet TAKE ONE TABLET BY MOUTH DAILY BEFORE BREAKFAST 05/12/22   Chilton Si, MD  LEVEMIR FLEXTOUCH 100 UNIT/ML FlexPen Inject 8 Units into the skin 2 (two) times daily. 01/02/21   Hoy Register, MD  nitroGLYCERIN (NITROSTAT) 0.4 MG SL tablet Place 1 tablet (0.4 mg total) under the tongue every 5 (five) minutes as needed for chest pain. 10/22/20 08/05/21  Marcelino Duster,  PA  Probiotic Product (PROBIOTIC MULTI-ENZYME) TABS Take 3 tablets by mouth daily at 6 (six) AM.    [provider]  rosuvastatin (CRESTOR) 40 MG tablet TAKE 1 TABLET BY MOUTH ONCE DAILY AT [facility] 04/30/23   Chilton Si, MD  sacubitril-valsartan (ENTRESTO) 97-103 MG TAKE ONE TABLET BY MOUTH TWO TIMES DAILY 05/28/23   Chilton Si, MD  spironolactone (ALDACTONE) 25 MG tablet TAKE 1 TABLET BY MOUTH ONCE DAILY 08/06/23   Chilton Si, MD  Turmeric 500 MG TABS Take 2 tablets by mouth daily at 6 (six) AM.    [provider]    Physical Exam: Vitals:   08/07/23 1739 08/07/23 1830 08/07/23 1844 08/07/23 1900  BP:  109/68  111/62  Pulse:  (!) 55  (!) 53  Resp:  17 19 13   Temp: 98.2 F (36.8 C)     TempSrc: Oral     SpO2:   97% 99%   General: Patient is in bed under the linen, turned to 1 side, does not make eye contact.  Seems to give short answers to questions and finally cut the encounter short. Respiratory exam: Declined by patient.  Patient on room air apparently no distress.  Bilateral excursion seems to be good Cardiovascular exam: Declined by patient.  No jugular venous distention no marked edema noted Abdomen: Declined by patient Extremities: No edema is noted.  There is bilateral gangrenous ulceration of legs: See representative images in chart.  Distal function seems to be intact.  Foul smell present Data Reviewed:  Labs on Admission:  Results for orders placed or performed during the hospital encounter of 08/07/23 (from the past 24 hour(s))  Basic metabolic panel     Status: Abnormal   Collection Time: 08/07/23 11:02 AM  Result Value Ref Range   Sodium 135 135 - 145 mmol/L   Potassium 3.6 3.5 - 5.1 mmol/L   Chloride 94 (L) 98 - 111 mmol/L   CO2 29 22 - 32 mmol/L   Glucose, Bld 525 (HH) 70 - 99 mg/dL   BUN 22 8 - 23 mg/dL   Creatinine, Ser 8.65 (H) 0.61 - 1.24 mg/dL   Calcium 8.7 (L) 8.9 - 10.3 mg/dL   GFR, Estimated 41 (L) >60 mL/min   Anion gap  12 5 - 15  CBC     Status: Abnormal   Collection Time: 08/07/23 11:02 AM  Result Value Ref Range   WBC 14.6 (H) 4.0 - 10.5 K/uL   RBC 3.39 (L) 4.22 - 5.81 MIL/uL   Hemoglobin 10.2 (L) 13.0 - 17.0 g/dL   HCT 78.4 (L) 69.6 - 29.5 %   MCV 92.6 80.0 - 100.0 fL   MCH 30.1 26.0 - 34.0 pg   MCHC 32.5 30.0 - 36.0 g/dL   RDW 28.4 13.2 - 44.0 %   Platelets 350 150 - 400 K/uL   nRBC 0.0 0.0 - 0.2 %  Troponin I (High Sensitivity)     Status: Abnormal   Collection Time: 08/07/23 11:02 AM  Result Value Ref Range   Troponin I (High Sensitivity) 127 (HH) <18 ng/L  Brain natriuretic peptide     Status: Abnormal   Collection Time: 08/07/23 11:28 AM  Result Value Ref Range   B Natriuretic Peptide 200.5 (H) 0.0 - 100.0 pg/mL  I-Stat Lactic Acid     Status: Abnormal   Collection Time: 08/07/23 12:39 PM  Result Value Ref Range   Lactic Acid, Venous 2.6 (HH) 0.5 - 1.9 mmol/L   Comment NOTIFIED PHYSICIAN   Troponin I (High Sensitivity)     Status: Abnormal   Collection Time: 08/07/23  1:02 PM  Result Value Ref Range   Troponin I (High Sensitivity) 39 (H) <18 ng/L  CBG monitoring, ED     Status: Abnormal   Collection Time: 08/07/23  2:42 PM  Result Value Ref Range   Glucose-Capillary 410 (H) 70 - 99 mg/dL  I-Stat Lactic Acid     Status: None   Collection Time: 08/07/23  2:49 PM  Result Value Ref Range   Lactic Acid, Venous 1.4 0.5 - 1.9 mmol/L  CBG monitoring, ED     Status: Abnormal   Collection Time: 08/07/23  5:36 PM  Result Value Ref Range   Glucose-Capillary 417 (H) 70 - 99 mg/dL  Basic Metabolic Panel: Recent Labs  Lab 08/07/23 1102  NA 135  K 3.6  CL 94*  CO2 29  GLUCOSE 525*  BUN 22  CREATININE 1.78*  CALCIUM 8.7*   Liver Function Tests: No results for input(s): "AST", "ALT", "ALKPHOS", "BILITOT", "PROT", "ALBUMIN" in the last 168 hours. No results for input(s): "LIPASE", "AMYLASE" in the last 168 hours. No results for input(s): "AMMONIA" in the last 168  hours. CBC: Recent Labs  Lab 08/07/23 1102  WBC 14.6*  HGB 10.2*  HCT 31.4*  MCV 92.6  PLT 350   Cardiac Enzymes: Recent Labs  Lab 08/07/23 1102 08/07/23 1302  TROPONINIHS 127* 39*    BNP (last 3 results) No results for input(s): "PROBNP" in the last 8760 hours. CBG: Recent Labs  Lab 08/07/23 1442 08/07/23 1736  GLUCAP 410* 417*    Radiological Exams on Admission:  DG Foot 2 Views Right  Result Date: 08/07/2023 CLINICAL DATA:  Chronic foot wounds, concern for osteo EXAM: RIGHT FOOT - 2 VIEW COMPARISON:  None Available. FINDINGS: Small soft tissue ulcer along the medial aspect of the 1st MTP joint. No associated adjacent cortical destruction to suggest osteomyelitis. Mild soft tissue swelling/edema, most prominent along the dorsal forefoot. No fracture or dislocation is seen. Vascular calcifications. IMPRESSION: Small soft tissue ulcer along the medial aspect of the 1st MTP joint. No radiographic findings of osteomyelitis. Electronically Signed   By: Charline Bills M.D.   On: 08/07/2023 18:27   DG Foot 2 Views Left  Result Date: 08/07/2023 CLINICAL DATA:  Chronic foot wounds, concern for osteo EXAM: LEFT FOOT - 2 VIEW COMPARISON:  None Available. FINDINGS: Soft tissue wound/ulcer along the plantar surface of the heel, adjacent to the calcaneus. Flattening of the talus with pes planus. Subtle cortical lucency medially along the anterior talus versus anterior process of the calcaneus on the frontal radiograph. While this could reflect osteomyelitis, it is not well visualized on the lateral view. Superimposed soft tissue gas related to the large ulcer could also lead to this appearance. No fracture is seen. Mild to moderate dorsal soft tissue swelling. IMPRESSION: Soft tissue wound/ulcer along the plantar surface of the heel, adjacent to the calcaneus. Possible subtle cortical lucency along the anterior calcaneus versus talus, equivocal. Osteomyelitis is not excluded.  Electronically Signed   By: Charline Bills M.D.   On: 08/07/2023 18:24   VAS [place] ABI WITH/WO TBI  Result Date: 08/07/2023  LOWER EXTREMITY DOPPLER STUDY Patient Name:  JANDRE SALDIERNA  Date of Exam:   08/07/2023 Medical Rec #: [id number]          Accession #:    [id number] Date of Birth: [birth year]          Patient Gender: M Patient Age:   51 years Exam Location:  [facility] Procedure:      VAS [place] ABI WITH/WO TBI Referring Phys: Evlyn Kanner --------------------------------------------------------------------------------  Indications: BLE DM foot infection/wounds High Risk Factors: Hypertension, hyperlipidemia, Diabetes, no history of                    smoking, coronary artery disease. Other Factors: CHF,.  Limitations: Today's exam was limited due to venous interference and hyperemic              flow. Comparison Study: No previous exams Performing Technologist: Ernestene Mention RVT/RDMS  Examination Guidelines: A complete evaluation includes at minimum, Doppler waveform signals and systolic blood pressure reading at the level of bilateral brachial, anterior tibial,  and posterior tibial arteries, when vessel segments are accessible. Bilateral testing is considered an integral part of a complete examination. Photoelectric Plethysmograph (PPG) waveforms and toe systolic pressure readings are included as required and additional duplex testing as needed. Limited examinations for reoccurring indications may be performed as noted.  ABI Findings: +---------+------------------+-----+-------------------+--------+ Right    Rt Pressure (mmHg)IndexWaveform           Comment  +---------+------------------+-----+-------------------+--------+ Brachial 128                    biphasic                    +---------+------------------+-----+-------------------+--------+ PTA      137               1.07 audibly multiphasic         +---------+------------------+-----+-------------------+--------+ DP        125               0.98 audibly multiphasic         +---------+------------------+-----+-------------------+--------+ Great Toe67                0.52 Abnormal                    +---------+------------------+-----+-------------------+--------+ +---------+------------------+-----+-------------------+-------+ Left     Lt Pressure (mmHg)IndexWaveform           Comment +---------+------------------+-----+-------------------+-------+ Brachial 116                    triphasic                  +---------+------------------+-----+-------------------+-------+ PTA      135               1.05 audibly multiphasic        +---------+------------------+-----+-------------------+-------+ DP       126               0.98 audibly multiphasic        +---------+------------------+-----+-------------------+-------+ Great Toe51                0.40 Abnormal                   +---------+------------------+-----+-------------------+-------+ +-------+-----------+-----------+------------+------------+ ABI/TBIToday's ABIToday's TBIPrevious ABIPrevious TBI +-------+-----------+-----------+------------+------------+ Right  1.07       0.52                                +-------+-----------+-----------+------------+------------+ Left   1.05       0.40                                +-------+-----------+-----------+------------+------------+ Accurate doppler signals difficult to obtain due to venous interference and hyperemic flow from infection.  Summary: Right: Resting right ankle-brachial index is within normal range. The right toe-brachial index is abnormal. Left: Resting left ankle-brachial index is within normal range. The left toe-brachial index is abnormal. *See table(s) above for measurements and observations.  Electronically signed by Carolynn Sayers on 08/07/2023 at 4:54:52 PM.    Final    CT Angio Chest PE W and/or Wo Contrast  Result Date: 08/07/2023 CLINICAL DATA:  Chest pain.  EXAM: CT ANGIOGRAPHY CHEST WITH CONTRAST TECHNIQUE: Multidetector CT imaging of the chest was performed using the standard protocol during bolus administration of intravenous contrast. Multiplanar CT image reconstructions and MIPs were  obtained to evaluate the vascular anatomy. RADIATION DOSE REDUCTION: This exam was performed according to the departmental dose-optimization program which includes automated exposure control, adjustment of the mA and/or kV according to patient size and/or use of iterative reconstruction technique. CONTRAST:  60mL OMNIPAQUE IOHEXOL 350 MG/ML SOLN COMPARISON:  July 19, 2021. FINDINGS: Cardiovascular: Satisfactory opacification of the pulmonary arteries to the segmental level. No evidence of pulmonary embolism. Normal heart size. No pericardial effusion. Coronary artery calcifications are noted. Mediastinum/Nodes: No enlarged mediastinal, hilar, or axillary lymph nodes. Thyroid gland, trachea, and esophagus demonstrate no significant findings. Lungs/Pleura: Lungs are clear. No pleural effusion or pneumothorax. Upper Abdomen: No acute abnormality. Musculoskeletal: No chest wall abnormality. No acute or significant osseous findings. Review of the MIP images confirms the above findings. IMPRESSION: No definite evidence of pulmonary embolus. Coronary artery calcifications are noted suggesting coronary artery disease. Aortic Atherosclerosis (ICD10-I70.0). Electronically Signed   By: Lupita Raider M.D.   On: 08/07/2023 15:49   DG Chest Port 1 View  Result Date: 08/07/2023 CLINICAL DATA:  Chest pain. EXAM: PORTABLE CHEST 1 VIEW COMPARISON:  07/09/2020. FINDINGS: Bilateral lung fields are clear. Note is made of elevated right hemidiaphragm. Bilateral costophrenic angles are clear. Normal cardio-mediastinal silhouette. No acute osseous abnormalities. The soft tissues are within normal limits. IMPRESSION: No active disease. Electronically Signed   By: Jules Schick M.D.   On: 08/07/2023  12:21    EKG: Independently reviewed. Stable since 12/29/2022   Assessment and Plan: Hypoxia This is actually the patient's principal reason for presentation.  Patient reports last couple of weeks of exertional shortness of breath that is progressive and now short of breath even at rest.  CT PE study is actually negative and I do not find marked fluid overload on the limited exam that I could do for the patient before patient interrupted the encounter.  Per report patient was actually hypoxic on ambulation but not at rest.  Given the current state of workup, residual concerns include possible pulmonary artery hypertension, pleural effusion or other limitation of cardiac output.  Especially given the troponin elevation I will go ahead and proceed with an echo at this time and see where that leads [place].  Continue with as needed oxygen.  AKI (acute kidney injury) (HCC) Patient baseline creatinine between 1.06 and 1.31.  Current creatinine 1.78.  Without associated elevated BUN.  I suspect this may be related to infection and hyperglycemia.  Patient received 500 cc normal saline fluid bolus in the ER.  Check urinalysis sodium creatinine.  Trend creatinine response to 75 cc/h fluid hydration overnight  Gangrene (HCC) Of b/l lower extermity - foot and ankle. Patient has foul-smelling slightly wet appearing black margin ulcers, see picture in media section of the chart.  Concerning for wet gangrene in this patient with uncontrolled diabetes mellitus and AKI. No  sepsis. Slight lactic acidosis POA, resolved.  Patient has had ankle-brachial index evaluation done, see separate study in the CV procedure section.  Patient has abnormal right and left toe to brachial index.  At this time time MRI is pending to rule out deep osteomyelitis versus occult abscess in the feet.  Patient is s/p Rocephin.  I will draw blood cultures and start the patient on vancomycin and Zosyn.  Patient will need surgical input given the  gangrene in his feet. I discussed case with Marisue Humble PA of vascular surgeon. Dr. Hetty Blend is in the OR (he was indirectly made aware of the consult).  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  He has an IgG kappa spike.   To me, I still think that his diabetes can be a much bigger problem than myeloma will be.   Hopefully, he will be able to get the blood sugar under control.   We will have to follow him up.  We will have to get him back in about 2 or 3 months for follow-up.   Hopefully, his blood sugars would not cause problems for him that we will end him up in the hospital."  Type 2 diabetes mellitus with chronic kidney disease, with long-term current use of insulin (HCC) Associated with hyperglycemia, without evidence of DKA.  Glucose on presentation was 525.  Likely worsened by infection and possible nonadherence to regimen.  Patient received 10 units of subcutaneous insulin in the ER.  I will continue with patient's remotely documented Levemir 8 units twice daily as well as insulin sliding scale ACHS moderate.  We will trend this.  Chronic combined systolic and diastolic heart failure (HCC) Chronically documented. Paitnet does not appear to be fluids overloaded at this time. Med rec pending pharmacy input. Trop of 127 at presentation now down to 39 - over all presentation not consistnt with ACS. EKG showing stable LVH changes since 12/29/2022. See complaint of SOB/hypoxia as well.   Med rec pending pharmacy input   Advance Care Planning:   Code Status: Prior full code.  Consults: vascular surgery.  Family Communication: per patient.  Severity of Illness: The appropriate patient status for this patient is INPATIENT. Inpatient status is judged to be reasonable and necessary in order to provide the required intensity of service to ensure the patient's safety. The patient's presenting symptoms,  physical exam findings, and initial radiographic and laboratory data in the context of their chronic comorbidities is felt to place them at high risk for further clinical deterioration. Furthermore, it is not anticipated that the patient will be medically stable for discharge from the hospital within 2 midnights of admission.   * I certify that at the point of admission it is my clinical judgment that the patient will require inpatient hospital care spanning beyond 2 midnights from the point of admission due to high intensity of service, high risk for further deterioration and high frequency of surveillance required.*  Author: Nolberto Hanlon, MD 08/07/2023 8:01 PM  For on call review [email or web address].

## 2023-08-07 NOTE — ED Notes (Signed)
Pt transported to vascular.  °

## 2023-08-07 NOTE — Assessment & Plan Note (Addendum)
Chronically documented. [place] does not appear to be fluids overloaded at this time. Med rec pending pharmacy input. Trop of 127 at presentation now down to 39 - over all presentation not consistnt with ACS. EKG showing stable LVH changes since 12/29/2022. See complaint of SOB/hypoxia as well.  08-21-2023 pt's AKI has resolved. Pt's Scr is back to his baseline of 1.2-1.3. pt on hydralazine. His Entresto, aldactone and farxiga were stopped when he was admitted for AKI.  HR at 60 bpm without any betablockers since admission. Cardiology notes reviewed. Pt had been complaining about feeling fatigued despite reduction in beta blocker dosage. I'm concerned about restarting beta blockers at this time due to already low resting HR.  Given his immobility for his bilateral BKA I doubt his resting HR will get above 60 bpm.  Pt already on hydralazine for HTN(25 mg tid). Pt is not taking much in the way of po liquids. I think adding spironolactone and Entresto back into his regimen may only worsen his now recovered AKI on CKD stage 3a. Will start isosorbide dinitrate 20 mg bid. I think he will need close f/u with [facility] clinic to get him back onto the right CHF meds. Luckily his echo on 08-11-2023 showed improving LVEF of 55-60%. LVEF was 45-50% in May 2024. I think adding his farxiga back is reasonable.  08-22-2023 will repeat CBC and CMP today.  As of today, CHF meds include hydralazine 25 mg tid and isordil 20 mg bid. Resting HR is in the 70s today. Will see what his labs look like today and decide if low dose coreg can be started as well as farxiga. He is not taking in enough fluids right now to need scheduled diuretics.  08-23-2023 pt refused PRBC transfusion yesterday. He is agreeable today. He did receive dose of lasix yesterday that was meant to be given in-between PRBC transfusion..that he ultimately did not receive yesterday.  With pt agreeing to PRBC transfusion today, hopefully his HgB will be greater than 8  g/dl. BP has improved with the changes. He remains on hydralazine 25 mg tid, isordil 20 mg bid. Coreg 3.125 mg bid added yesterday. HR in the mid 50s bpm today. Acceptable Heart rates. I still don't think he is ready to restart Entresto. Will see what his renal function is like tomorrow and make decision on adding back [place].

## 2023-08-07 NOTE — ED Notes (Signed)
Pt in MRI.

## 2023-08-07 NOTE — ED Notes (Signed)
Pt returned from ct scan

## 2023-08-07 NOTE — ED Provider Notes (Signed)
  Physical Exam  BP (!) 145/71   Pulse (!) 54   Temp 98 F (36.7 C) (Rectal)   Resp 16   SpO2 100%   Physical Exam  Procedures  Procedures  ED Course / MDM    Medical Decision Making Amount and/or Complexity of Data Reviewed Labs: ordered. Radiology: ordered.  Risk Prescription drug management. Decision regarding hospitalization.   Patient was received in handoff from prior ED PA.  Patient has past medical history of heart failure last EF 45 to 50%, uncontrolled diabetes, chronic kidney disease, coronary artery disease, hypertension. In summary patient presented to emergency room with shortness of breath and hypoxia was ambulation. At baseline patient is not requiring oxygen. Does not require oxygen at rest. Patient has no evidence of pneumonia nor pulmonary embolism on scan.  Patient also has leg wounds and elevated white blood cell count thus we will start antibiotics.  X-ray of right foot without evidence of osteomyelitis.  X-ray of left foot with possible evidence of osteomyelitis thus I will order MRI for further evaluation.   EKG appears to be at baseline for patient with no acute changes.  Initial troponin troponin 127, repeat troponin downtrending at 39.  I suspect this was secondary to demand ischemia.  Lactic was initially 2.6, after fluids improved to 1.4.  At handoff patient was hypoglycemic, I have given 10 units of insulin.  Given that patient has had shortness of breath and desaturation with ambulation, has AKI and elevated white blood cell count possible osteomyelitis I feel he would benefit from admission, will page hospital. Hospitalist agreed to admission.        Smitty Knudsen, PA-C 08/07/23 2304    Franne Forts, DO 08/17/23 1431

## 2023-08-07 NOTE — Assessment & Plan Note (Addendum)
On admission through 12-15, 2024, , Of b/l lower extermity - foot and ankle. Patient has foul-smelling slightly wet appearing black margin ulcers, see picture in media section of the chart.  Concerning for wet gangrene in this patient with uncontrolled diabetes mellitus and AKI. No  sepsis. Slight lactic acidosis POA, resolved. Patient has had ankle-brachial index evaluation done, see separate study in the CV procedure section.  Patient has abnormal right and left toe to brachial index.  At this time time MRI is pending to rule out deep osteomyelitis versus occult abscess in the feet.  Patient is s/p Rocephin.  I will draw blood cultures and start the patient on vancomycin and Zosyn.  Patient will need surgical input given the gangrene in his feet. I discussed case with Marisue Humble PA of vascular surgeon. Dr. Hetty Blend is in the OR (he was indirectly made aware of the consult).  08-17-2023 pt is s/p bilateral BKA(trans-tibial). Will need SNF. No need for abx. He has surgical cure of his gangrene.  08-19-2023 resolved with amputation

## 2023-08-07 NOTE — ED Notes (Signed)
Pt remains in MRI 

## 2023-08-07 NOTE — Assessment & Plan Note (Addendum)
On admission through 12-15, 2024, This is actually the patient's principal reason for presentation.  Patient reports last couple of weeks of exertional shortness of breath that is progressive and now short of breath even at rest.  CT PE study is actually negative and I do not find marked fluid overload on the limited exam that I could do for the patient before patient interrupted the encounter.  Per report patient was actually hypoxic on ambulation but not at rest.  Given the current state of workup, residual concerns include possible pulmonary artery hypertension, pleural effusion or other limitation of cardiac output.  Especially given the troponin elevation I will go ahead and proceed with an echo at this time and see where that leads [place].  Continue with as needed oxygen.  08-17-2023 resolved.

## 2023-08-07 NOTE — ED Notes (Signed)
Attempted to update wife, voicemail left with room assignment

## 2023-08-07 NOTE — ED Notes (Signed)
Family has called multiple times have transferred to patients room and nurse is aware

## 2023-08-08 ENCOUNTER — Inpatient Hospital Stay (HOSPITAL_COMMUNITY): Payer: 59

## 2023-08-08 DIAGNOSIS — L97511 Non-pressure chronic ulcer of other part of right foot limited to breakdown of skin: Secondary | ICD-10-CM

## 2023-08-08 DIAGNOSIS — L97521 Non-pressure chronic ulcer of other part of left foot limited to breakdown of skin: Secondary | ICD-10-CM

## 2023-08-08 DIAGNOSIS — N179 Acute kidney failure, unspecified: Secondary | ICD-10-CM | POA: Diagnosis not present

## 2023-08-08 DIAGNOSIS — R7989 Other specified abnormal findings of blood chemistry: Secondary | ICD-10-CM

## 2023-08-08 DIAGNOSIS — J9601 Acute respiratory failure with hypoxia: Secondary | ICD-10-CM | POA: Diagnosis not present

## 2023-08-08 DIAGNOSIS — G9341 Metabolic encephalopathy: Secondary | ICD-10-CM | POA: Diagnosis present

## 2023-08-08 DIAGNOSIS — I998 Other disorder of circulatory system: Secondary | ICD-10-CM | POA: Diagnosis not present

## 2023-08-08 LAB — BASIC METABOLIC PANEL
Anion gap: 10 (ref 5–15)
BUN: 26 mg/dL — ABNORMAL HIGH (ref 8–23)
CO2: 27 mmol/L (ref 22–32)
Calcium: 8.4 mg/dL — ABNORMAL LOW (ref 8.9–10.3)
Chloride: 102 mmol/L (ref 98–111)
Creatinine, Ser: 2.3 mg/dL — ABNORMAL HIGH (ref 0.61–1.24)
GFR, Estimated: 30 mL/min — ABNORMAL LOW (ref >60)
Glucose, Bld: 265 mg/dL — ABNORMAL HIGH (ref 70–99)
Potassium: 3.1 mmol/L — ABNORMAL LOW (ref 3.5–5.1)
Sodium: 139 mmol/L (ref 135–145)

## 2023-08-08 LAB — HEPATIC FUNCTION PANEL
ALT: 10 U/L (ref 0–44)
AST: 14 U/L — ABNORMAL LOW (ref 15–41)
Albumin: 1.9 g/dL — ABNORMAL LOW (ref 3.5–5.0)
Alkaline Phosphatase: 59 U/L (ref 38–126)
Bilirubin, Direct: 0.1 mg/dL (ref 0.0–0.2)
Indirect Bilirubin: 0.8 mg/dL (ref 0.3–0.9)
Total Bilirubin: 0.9 mg/dL (ref <1.2)
Total Protein: 6.6 g/dL (ref 6.5–8.1)

## 2023-08-08 LAB — GLUCOSE, CAPILLARY
Glucose-Capillary: 200 mg/dL — ABNORMAL HIGH (ref 70–99)
Glucose-Capillary: 143 mg/dL — ABNORMAL HIGH (ref 70–99)
Glucose-Capillary: 308 mg/dL — ABNORMAL HIGH (ref 70–99)

## 2023-08-08 LAB — APTT: aPTT: 36 s (ref 24–36)

## 2023-08-08 LAB — CBC
HCT: 30.4 % — ABNORMAL LOW (ref 39.0–52.0)
Hemoglobin: 9.9 g/dL — ABNORMAL LOW (ref 13.0–17.0)
MCH: 30.5 pg (ref 26.0–34.0)
MCHC: 32.6 g/dL (ref 30.0–36.0)
MCV: 93.5 fL (ref 80.0–100.0)
Platelets: 389 10*3/uL (ref 150–400)
RBC: 3.25 MIL/uL — ABNORMAL LOW (ref 4.22–5.81)
RDW: 12.2 % (ref 11.5–15.5)
WBC: 14.2 10*3/uL — ABNORMAL HIGH (ref 4.0–10.5)
nRBC: 0 % (ref 0.0–0.2)

## 2023-08-08 LAB — TSH: TSH: 2.433 u[IU]/mL (ref 0.350–4.500)

## 2023-08-08 LAB — VITAMIN B12: Vitamin B-12: 3571 pg/mL — ABNORMAL HIGH (ref 180–914)

## 2023-08-08 LAB — FOLATE: Folate: 12.5 ng/mL (ref >5.9)

## 2023-08-08 LAB — PROTIME-INR
INR: 1.2 (ref 0.8–1.2)
Prothrombin Time: 15.7 s — ABNORMAL HIGH (ref 11.4–15.2)

## 2023-08-08 MED ORDER — HALOPERIDOL 1 MG PO TABS: 1.0000 mg | ORAL_TABLET | Freq: Four times a day (QID) | ORAL | Status: DC | PRN

## 2023-08-08 MED ORDER — OLANZAPINE 5 MG PO TABS: 2.5000 mg | ORAL_TABLET | Freq: Every day | ORAL | Status: DC

## 2023-08-08 MED ORDER — HALOPERIDOL LACTATE 5 MG/ML IJ SOLN: 1.0000 mg | Freq: Four times a day (QID) | INTRAMUSCULAR | Status: DC | PRN

## 2023-08-08 MED ORDER — VANCOMYCIN HCL 750 MG/150ML IV SOLN
750.0000 mg | INTRAVENOUS | Status: DC
  Administered 2023-08-08: 750 mg via INTRAVENOUS
  Filled 2023-08-08: qty 150

## 2023-08-08 MED ORDER — OLANZAPINE 10 MG IM SOLR
2.5000 mg | Freq: Once | INTRAMUSCULAR | Status: DC
  Filled 2023-08-08: qty 10

## 2023-08-08 MED ORDER — LORAZEPAM 1 MG PO TABS
1.0000 mg | ORAL_TABLET | Freq: Three times a day (TID) | ORAL | Status: DC | PRN
  Administered 2023-08-14 – 2023-09-02 (×6): 1 mg via ORAL
  Filled 2023-08-08 (×7): qty 1

## 2023-08-08 MED ORDER — ENOXAPARIN SODIUM 30 MG/0.3ML IJ SOSY
30.0000 mg | PREFILLED_SYRINGE | INTRAMUSCULAR | Status: DC
  Administered 2023-08-09 – 2023-08-18 (×8): 30 mg via SUBCUTANEOUS
  Filled 2023-08-08 (×9): qty 0.3

## 2023-08-08 MED ORDER — LORAZEPAM 2 MG/ML IJ SOLN
1.0000 mg | Freq: Three times a day (TID) | INTRAMUSCULAR | Status: DC | PRN
  Filled 2023-08-08: qty 1

## 2023-08-08 NOTE — Progress Notes (Signed)
Pt refused tele monitor. RN explained and attempted twice to put the tele monitor on but the pt still keep removing the leads after few hours. On call provider made aware. Will try to put tele monitor this morning. Will continue to monitor.

## 2023-08-08 NOTE — Progress Notes (Signed)
Patient refusing Echo. RN to bedside to provide education regarding test. Patient verbally combative, refusing to answer any orientation questions. Patient states, "you figure that shit out yourself." RN informed patient of reasoning behind hospitalization, offered to call patient's spouse for him. Patient states, "leave her out of this."  RN called patient's wife, Lamar Laundry. No answer. VM left with request for a return call.  Patient is noncompliant with safety precautions.

## 2023-08-08 NOTE — Progress Notes (Signed)
Pt removed his IV line twice, refused to put IV line again, and refused blood draw. Pt stated he is okay now and wants to get up and walk around. Pt alert oriented x 1-2 disoriented with date and situation. Pt is also irritable since he got here in the room and refusing to answer some of the questions.  Wife called and spoke with the pt over the phone after few minutes, wife spoke to the RN worrying that the pt is not like that. On call provider notified and made aware of this event. Will continue to monitor.

## 2023-08-08 NOTE — Consult Note (Signed)
Hospital Consult    Reason for Consult: Bilateral foot wounds Requesting Physician: Dr. Isidoro Donning  MRN #:  [id number]  History of Present Illness: This is a 72 y.o. male with was admitted with shortness of breath and fatigue.  He was found to have wounds on bilateral feet and reports that they have been there for at least 2 weeks.  Should be noted that he is a poor historian seems to be confused saying once he is discharged he can then talk to the doctors about his foot wounds.  He is ambulatory with a cane and a walker at home.  He mentions.  Past Medical History:  Diagnosis Date   CAD in native artery 01/01/2021   Prior LAD PCI.  Currently no symptoms of ischemia.  Encouraged him to do some light exercising.  Continue aspirin, carvedilol, and rosuvastatin.   Cataract    CKD (chronic kidney disease), stage III (HCC) 08/24/2020   Coronary artery disease    Diabetes mellitus without complication (HCC)    on meds   Diabetic retinopathy (HCC)    Goals of care, counseling/discussion 07/01/2021   Hernia, inguinal    Hypertension    on meds   Hypertensive retinopathy    Lymphadenopathy, cervical 07/01/2021   MGUS (monoclonal gammopathy of unknown significance) 07/01/2021   NSVT (nonsustained ventricular tachycardia) (HCC) 08/24/2020   Pure hypercholesterolemia 08/05/2021   Resistant hypertension 11/11/2018   Uncontrolled type 2 diabetes mellitus 11/11/2018    Past Surgical History:  Procedure Laterality Date   CORONARY STENT INTERVENTION N/A 11/15/2018   Procedure: CORONARY STENT INTERVENTION;  Surgeon: Corky Crafts, MD;  Location: [facility] INVASIVE CV LAB;  Service: Cardiovascular;  Laterality: N/A;   HEMORROIDECTOMY     RIGHT HEART CATH N/A 07/18/2020   Procedure: RIGHT HEART CATH;  Surgeon: Iran Ouch, MD;  Location: [facility] INVASIVE CV LAB;  Service: Cardiovascular;  Laterality: N/A;   RIGHT/LEFT HEART CATH AND CORONARY ANGIOGRAPHY N/A 11/15/2018   Procedure: RIGHT/LEFT HEART  CATH AND CORONARY ANGIOGRAPHY;  Surgeon: Corky Crafts, MD;  Location: [facility] INVASIVE CV LAB;  Service: Cardiovascular;  Laterality: N/A;   WISDOM TOOTH EXTRACTION      No Known Allergies  Prior to Admission medications   Medication Sig Start Date End Date Taking? Authorizing Provider  APPLE CIDER VINEGAR PO Take 1 tablet by mouth daily at 6 (six) AM.    [provider]  aspirin EC (ASPIRIN LOW DOSE) 81 MG tablet Take 1 tablet (81 mg total) by mouth daily. Swallow whole. 06/26/23   Chilton Si, MD  carvedilol (COREG) 6.25 MG tablet Take 1 tablet (6.25 mg total) by mouth 2 (two) times daily with a meal. 12/29/22   Chilton Si, MD  ELDERBERRY PO Take 100 mg by mouth daily.    [provider]  ferrous sulfate 325 (65 FE) MG tablet Take 1 tablet (325 mg total) by mouth daily with breakfast. 11/11/21   Chilton Si, MD  furosemide (LASIX) 40 MG tablet TAKE 1 TABLET BY MOUTH ONCE DAILY 10/10/21   Alver Sorrow, NP  glucose blood (FREESTYLE TEST STRIPS) test strip Use as instructed 10/21/20   Grayce Sessions, NP  HUMALOG KWIKPEN 100 UNIT/ML KwikPen Inject into the skin. Sliding Scale 06/13/22   [provider]  hydrALAZINE (APRESOLINE) 25 MG tablet TAKE 1 TABLET BY MOUTH IN THE MORNING, AND TAKE 1 TABLET AT BEDTIME 05/18/23   Chilton Si, MD  Insulin Pen Needle (NOVOFINE PEN NEEDLE) 32G X 6 MM  MISC USE AS DIRECTED 01/11/21   Claiborne Rigg, NP  JARDIANCE 10 MG TABS tablet TAKE ONE TABLET BY MOUTH DAILY BEFORE BREAKFAST 05/12/22   Chilton Si, MD  LEVEMIR FLEXTOUCH 100 UNIT/ML FlexPen Inject 8 Units into the skin 2 (two) times daily. 01/02/21   Hoy Register, MD  nitroGLYCERIN (NITROSTAT) 0.4 MG SL tablet Place 1 tablet (0.4 mg total) under the tongue every 5 (five) minutes as needed for chest pain. 10/22/20 08/05/21  Marcelino Duster, PA  Probiotic Product (PROBIOTIC MULTI-ENZYME) TABS Take 3 tablets by mouth daily at 6 (six) AM.     [provider]  rosuvastatin (CRESTOR) 40 MG tablet TAKE 1 TABLET BY MOUTH ONCE DAILY AT [facility] 04/30/23   Chilton Si, MD  sacubitril-valsartan (ENTRESTO) 97-103 MG TAKE ONE TABLET BY MOUTH TWO TIMES DAILY 05/28/23   Chilton Si, MD  spironolactone (ALDACTONE) 25 MG tablet TAKE 1 TABLET BY MOUTH ONCE DAILY 08/06/23   Chilton Si, MD  Turmeric 500 MG TABS Take 2 tablets by mouth daily at 6 (six) AM.    [provider]    Social History   Socioeconomic History   Marital status: Married    Spouse name: Not on file   Number of children: Not on file   Years of education: Not on file   Highest education level: Not on file  Occupational History   Not on file  Tobacco Use   Smoking status: Never   Smokeless tobacco: Never  Vaping Use   Vaping status: Never Used  Substance and Sexual Activity   Alcohol use: Not Currently   Drug use: Never   Sexual activity: Not Currently  Other Topics Concern   Not on file  Social History Narrative   Not on file   Social Determinants of Health   Financial Resource Strain: Medium Risk (09/21/2020)   Overall Financial Resource Strain (CARDIA)    Difficulty of Paying Living Expenses: Somewhat hard  Food Insecurity: No Food Insecurity (08/07/2023)   Hunger Vital Sign    Worried About Running Out of Food in the Last Year: Never true    Ran Out of Food in the Last Year: Never true  Transportation Needs: No Transportation Needs (08/07/2023)   PRAPARE - Administrator, Civil Service (Medical): No    Lack of Transportation (Non-Medical): No  Physical Activity: Not on file  Stress: Not on file  Social Connections: Not on file  Intimate Partner Violence: Not At Risk (08/07/2023)   Humiliation, Afraid, Rape, and Kick questionnaire    Fear of Current or Ex-Partner: No    Emotionally Abused: No    Physically Abused: No    Sexually Abused: No    Family History  Problem Relation Age of Onset   Hypertension Mother     Diabetes Mellitus II Mother    Arrhythmia Mother    Heart disease Mother    Hypertension Sister    Hypertension Brother    Heart disease Brother    Kidney disease Brother    Hypertension Other    Diabetes Mellitus II Other        All 9 sibblings have HTN   Colon cancer Neg Hx    Colon polyps Neg Hx    Esophageal cancer Neg Hx    Stomach cancer Neg Hx    Rectal cancer Neg Hx     ROS: Otherwise negative unless mentioned in HPI  Physical Examination  Vitals:   08/08/23 0433 08/08/23 0740  BP: (!) 111/48 124/63  Pulse: (!) 56 (!) 57  Resp: 18 18  Temp: 99.7 F (37.6 C) 98.7 F (37.1 C)  SpO2: 92% 94%   Body mass index is 20.81 kg/m.  General: no acute distress Cardiac: hemodynamically stable, nontachycardic Pulm: normal work of breathing GI: non-tender, no pulsatile mass  Neuro: alert, no focal deficit Extremities:      Vascular:   Right: Palpable femoral  Left: Palpable femoral   Data:   +---------+------------------+-----+-------------------+--------+  Right   Rt Pressure (mmHg)IndexWaveform           Comment   +---------+------------------+-----+-------------------+--------+  Brachial 128                    biphasic                     +---------+------------------+-----+-------------------+--------+  PTA     137               1.07 audibly multiphasic          +---------+------------------+-----+-------------------+--------+  DP      125               0.98 audibly multiphasic          +---------+------------------+-----+-------------------+--------+  Great Toe67                0.52 Abnormal                     +---------+------------------+-----+-------------------+--------+   +---------+------------------+-----+-------------------+-------+  Left    Lt Pressure (mmHg)IndexWaveform           Comment  +---------+------------------+-----+-------------------+-------+  Brachial 116                    triphasic                    +---------+------------------+-----+-------------------+-------+  PTA     135               1.05 audibly multiphasic         +---------+------------------+-----+-------------------+-------+  DP      126               0.98 audibly multiphasic         +---------+------------------+-----+-------------------+-------+  Great Toe51                0.40 Abnormal                    +---------+------------------+-----+-------------------+-------+   +-------+-----------+-----------+------------+------------+  ABI/TBIToday's ABIToday's TBIPrevious ABIPrevious TBI  +-------+-----------+-----------+------------+------------+  Right 1.07       0.52                                 +-------+-----------+-----------+------------+------------+  Left  1.05       0.40                                 +-------+-----------+-----------+------------+------------+   Bilateral foot x-rays without osteomyelitis    ASSESSMENT/PLAN: This is a 72 y.o. male with chronic limb threatening ischemia of bilateral lower extremities.  I explained that he is at high risk of bilateral amputation but he seems to be confused and does not quite understand.  X-rays do not demonstrate osteomyelitis.  Awaiting MRI reads  I will call the wife today to obtain a better  history and discuss possible angiogram on Monday.    Daria Pastures MD [facility] [phone] 08/08/2023  8:02 AM

## 2023-08-08 NOTE — Progress Notes (Addendum)
[facility]                                                                              Jakobi Darrisaw, is a 72 y.o. male, DOB - [birth year], ZOX:[id number] Admit date - 08/07/2023    Outpatient Primary MD for the patient is [name], [name], MD  LOS - 1  days  Chief Complaint  Patient presents with   Chest Pain       Brief summary   Patient is a 72 year old male with CAD, CKD stage III, DM type II, uncontrolled, MGUS, chronic systolic CHF, NSVT, resistant hypertension, hyperlipidemia presented to ED with shortness of breath.  Per admission history, patient has been complaining of progressive shortness of breath in the last 2 weeks, generalized anterior chest pain with efforts of breathing, no fevers, cough or leg swelling. He also reported chronic leg wounds and have been worsening for the last couple of weeks, more painful. In ED, patient was noted to be hypoxic on attempts at ambulation.    Assessment & Plan    Principal Problem:   Acute respiratory failure with hypoxia (HCC) -Patient had presented initially for shortness of breath and hypoxia -Unclear etiology, CTA chest showed no evidence of pulmonary embolus, aortic atherosclerosis, coronary artery calcifications.  No pleural effusion or pneumothorax. -Obtain 2D echo -Improving, currently O2 sats 92 to 94% on room air at rest  Active Problems: Bilateral lower extremity wounds, gangrene -Foul-smelling black margin ulcers on examination - MRI right foot showed soft tissue ulceration along the medial aspect of first MTP joint, no osteomyelitis or abscess, nonspecific marrow edema within the second metatarsal head with questionable underlying linear subchondral low signal, could reflect Freiberg infarction or stress fracture -MRI left foot showed soft tissue ulceration along the plantar aspect of the hindfoot with underlying subcutaneous edema, soft tissue emphysema consistent with soft tissue  infection, possible necrotizing infection.  HSN mild calcaneal marrow T2 hyperintensity in the calcaneal tuberosity could represent hyperemia or early osteomyelitis -Continue IV vancomycin, Zosyn, follow blood cultures -Vascular surgery consulted  Acute metabolic encephalopathy -May have underlying dementia.  CT head in 04/2022 had shown atrophy with chronic microvascular disease -UA showed> 500 glucose, small Hgb, otherwise negative for UTI -Will repeat CT head, dementia workup including TSH, B1, B12, folate.      Chronic combined systolic and diastolic heart failure (HCC) -Follow 2D echo.  Previous echo 12/2022 had shown EF of 45 to 50% with G1 DD -BNP 200.5,  Acute kidney injury on CKD stage IIIa -Baseline creatinine 1.2 -Presented with creatinine of 1.78, lactic acidosis -Hold Entresto, Aldactone, furosemide    Type 2 diabetes mellitus with chronic kidney disease, with long-term current use of insulin (HCC) -Uncontrolled with hyperglycemia -Hemoglobin A1c 9.1 CBG (last 3)  Recent Labs    08/07/23 2125 08/08/23 0430 08/08/23 0738  GLUCAP 332* 200* 143*   -Continue moderate sliding scale insulin, SSI  Prolonged QTc -EKG on admission shows QTc of 522 -Avoid QT prolonging meds     MGUS (monoclonal gammopathy of unknown significance) -Outpatient follow with Dr. [name], last seen on 12/05/22  Estimated body mass index is 20.81  kg/m as calculated from the following:   Height as of this encounter: 5' 9.5" (1.765 m).   Weight as of this encounter: 64.9 kg.  Code Status: Full code DVT Prophylaxis:  enoxaparin (LOVENOX) injection 40 mg Start: 08/08/23 1000 SCDs Start: 08/07/23 2043   Level of Care: Level of care: Telemetry Medical Family Communication:  Disposition Plan:      Remains inpatient appropriate:      Procedures:    Consultants:   Vascular surgery  Antimicrobials:   Anti-infectives (From admission, onward)    Start     Dose/Rate Route Frequency Ordered  Stop   08/08/23 2200  vancomycin (VANCOREADY) IVPB 750 mg/150 mL        750 mg 150 mL/hr over 60 Minutes Intravenous Every 24 hours 08/08/23 0015     08/07/23 2330  Vancomycin (VANCOCIN) 1,500 mg in sodium chloride 0.9 % 500 mL IVPB        1,500 mg 250 mL/hr over 120 Minutes Intravenous  Once 08/07/23 2240 08/08/23 0740   08/07/23 2300  piperacillin-tazobactam (ZOSYN) IVPB 3.375 g        3.375 g 12.5 mL/hr over 240 Minutes Intravenous Every 8 hours 08/07/23 2240     08/07/23 1715  cefTRIAXone (ROCEPHIN) 1 g in sodium chloride 0.9 % 100 mL IVPB        1 g 200 mL/hr over 30 Minutes Intravenous  Once 08/07/23 1707 08/07/23 1848          Medications  aspirin EC  81 mg Oral Daily   carvedilol  3.125 mg Oral BID WC   enoxaparin (LOVENOX) injection  40 mg Subcutaneous Q24H   insulin aspart  0-15 Units Subcutaneous TID WC   insulin aspart  0-5 Units Subcutaneous QHS   insulin detemir  8 Units Subcutaneous BID   rosuvastatin  40 mg Oral QHS   sodium chloride flush  3 mL Intravenous Q12H      Subjective:   Amit Zirkel was seen and examined today.  Somewhat confused, states "feeling cold and wants to rest up".  He did allow me to examine him including his feet, noted foul-smelling odor with black margin ulcers on bilateral lower extremities.  Denied any fevers, chills, pain, acute shortness of breath.     Objective:   Vitals:   08/07/23 2223 08/08/23 0134 08/08/23 0433 08/08/23 0740  BP: (!) 105/54 (!) 141/79 (!) 111/48 124/63  Pulse: (!) 55 (!) 57 (!) 56 (!) 57  Resp: 18 18 18 18   Temp: 98.8 F (37.1 C) 98.6 F (37 C) 99.7 F (37.6 C) 98.7 F (37.1 C)  TempSrc: Oral Oral Oral Oral  SpO2: 95% 98% 92% 94%  Weight: 64.9 kg     Height: 5' 9.5" (1.765 m)       Intake/Output Summary (Last 24 hours) at 08/08/2023 1116 Last data filed at 08/08/2023 0600 Gross per 24 hour  Intake 892.57 ml  Output 150 ml  Net 742.57 ml     Wt Readings from Last 3 Encounters:   08/07/23 64.9 kg  03/12/23 65 kg  12/29/22 66.1 kg     Exam General: Alert and awake, NAD, limited responses Cardiovascular: S1 S2 auscultated,  RRR Respiratory: CTA B Gastrointestinal: Soft, NT, ND, NBS Ext: no pedal edema bilaterally bilateral wounds Neuro:  Skin: LE ulcers, foul-smelling Psych:           Data Reviewed:  I have personally reviewed following labs    CBC Lab Results  Component Value  Date   WBC 12.9 (H) 08/07/2023   RBC 2.93 (L) 08/07/2023   HGB 8.9 (L) 08/07/2023   HCT 27.1 (L) 08/07/2023   MCV 92.5 08/07/2023   MCH 30.4 08/07/2023   PLT 373 08/07/2023   MCHC 32.8 08/07/2023   RDW 12.0 08/07/2023   LYMPHSABS 1.1 12/25/2022   MONOABS 0.4 12/25/2022   EOSABS 0.1 12/25/2022   BASOSABS 0.0 12/25/2022     Last metabolic panel Lab Results  Component Value Date   NA 135 08/07/2023   K 3.6 08/07/2023   CL 94 (L) 08/07/2023   CO2 29 08/07/2023   BUN 22 08/07/2023   CREATININE 1.76 (H) 08/07/2023   GLUCOSE 525 (HH) 08/07/2023   GFRNONAA 41 (L) 08/07/2023   GFRAA 78 09/26/2020   CALCIUM 8.7 (L) 08/07/2023   PHOS 2.7 11/11/2018   PROT 7.7 12/25/2022   ALBUMIN 3.8 12/25/2022   LABGLOB 4.0 (H) 12/25/2022   AGRATIO 0.9 12/25/2022   BILITOT 0.9 12/25/2022   ALKPHOS 89 12/25/2022   AST 21 12/25/2022   ALT 25 12/25/2022   ANIONGAP 12 08/07/2023    CBG (last 3)  Recent Labs    08/07/23 2125 08/08/23 0430 08/08/23 0738  GLUCAP 332* 200* 143*      Coagulation Profile: No results for input(s): "INR", "PROTIME" in the last 168 hours.   Radiology Studies: I have personally reviewed the imaging studies  MR FOOT LEFT WO CONTRAST  Result Date: 08/08/2023 CLINICAL DATA:  Diabetic with chronic bilateral foot wounds. Left plantar hindfoot wound. Soft tissue infection suspected. Evaluate for osteomyelitis. EXAM: MRI OF THE LEFT FOOT WITHOUT CONTRAST TECHNIQUE: Multiplanar, multisequence MR imaging of the left hindfoot was performed. No  intravenous contrast was administered. COMPARISON:  Radiographs 08/07/2023 and 04/21/2022. FINDINGS: Bones/Joint/Cartilage As demonstrated radiographically, there is soft tissue ulceration along the plantar aspect of the hindfoot with underlying heterogeneous edema and soft tissue emphysema throughout the subcutaneous fat. These soft tissue changes abut the inferolateral aspect of the calcaneal tuberosity and are associated with mild calcaneal marrow T2 hyperintensity. No definite cortical destruction on the T1 weighted images. No other suspicious or acute osseous findings identified within the hindfoot or midfoot. There are chronic subtalar and talonavicular degenerative changes and a mild pes planus deformity. No significant tibiotalar arthropathy or effusion. Ligaments The major ankle ligaments appear intact. Muscles and Tendons The ankle tendons appear intact without significant tenosynovitis. No focal muscular abnormalities are identified. Mild generalized muscular T2 hyperintensity, likely related to underlying diabetes. Soft tissues As above, soft tissue ulceration along the plantar aspect of the hindfoot with underlying subcutaneous edema and soft tissue emphysema as correlated with the earlier radiographs. No focal fluid collection identified. There is mild nonspecific dorsal subcutaneous edema. IMPRESSION: 1. Soft tissue ulceration along the plantar aspect of the hindfoot with underlying subcutaneous edema and soft tissue emphysema, consistent with soft tissue infection. The soft tissue gas suggest possible necrotizing infection. Correlate clinically. 2. Adjacent mild calcaneal marrow T2 hyperintensity in the calcaneal tuberosity without cortical destruction. This finding is nonspecific and could be secondary to hyperemia or early osteomyelitis. 3. No focal fluid collection identified. 4. Chronic subtalar and talonavicular degenerative changes. Electronically Signed   By: Carey Bullocks M.D.   On:  08/08/2023 08:08   MR FOOT RIGHT WO CONTRAST  Result Date: 08/08/2023 CLINICAL DATA:  Chronic bilateral foot wounds. Clinical concern for osteomyelitis. Soft tissue ulcer along the medial aspect of the 1st MTP joint. Soft tissue infection suspected. EXAM: MRI OF THE  RIGHT FOREFOOT WITHOUT CONTRAST TECHNIQUE: Multiplanar, multisequence MR imaging of the right forefoot was performed. No intravenous contrast was administered. COMPARISON:  Radiographs same date.  No other comparison studies. FINDINGS: Bones/Joint/Cartilage Mild soft tissue ulceration along the medial aspect of the 1st metatarsophalangeal joint. Underlying mild 1st metatarsal-phalangeal degenerative changes with joint space narrowing and osteophytes. No significant joint effusion or evidence of the underlying osteomyelitis. There is nonspecific marrow edema within the 2nd metatarsal head with questionable underlying linear subchondral low signal, best seen on sagittal image 16/9. This could reflect Freiberg infraction. Mild proximal extension of marrow edema into the 2nd metatarsal shaft. No significant joint effusion or abnormality of the adjacent 2nd proximal phalangeal base. The additional toes and metatarsals are normal. Normal alignment at the Lisfranc joint Ligaments Intact Lisfranc ligament. The collateral ligaments of the metatarsophalangeal joints appear intact. Muscles and Tendons The forefoot tendons appear intact. Mild edema and a small amount of ill-defined fluid along the flexor digitorum tendons within the forefoot. Mild nonspecific muscular atrophy and edema without focal fluid collection. Soft tissues As above, mild soft tissue ulceration along the medial aspect of the 1st metatarsophalangeal joint. No underlying focal fluid collection. Nonspecific dorsal subcutaneous edema. In correlation with radiographs, underlying vascular calcifications typical of diabetes. IMPRESSION: 1. Mild soft tissue ulceration along the medial aspect of  the 1st metatarsophalangeal joint without evidence of underlying osteomyelitis or abscess. 2. Nonspecific marrow edema within the 2nd metatarsal head with questionable underlying linear subchondral low signal. This could reflect Freiberg infraction or a stress fracture. 3. Mild degenerative changes of the 1st metatarsophalangeal joint. 4. Mild nonspecific edema and a small amount of ill-defined fluid along the flexor digitorum tendons and mild dorsal subcutaneous edema. No focal fluid collections. Electronically Signed   By: Carey Bullocks M.D.   On: 08/08/2023 07:58   DG Foot 2 Views Right  Result Date: 08/07/2023 CLINICAL DATA:  Chronic foot wounds, concern for osteo EXAM: RIGHT FOOT - 2 VIEW COMPARISON:  None Available. FINDINGS: Small soft tissue ulcer along the medial aspect of the 1st MTP joint. No associated adjacent cortical destruction to suggest osteomyelitis. Mild soft tissue swelling/edema, most prominent along the dorsal forefoot. No fracture or dislocation is seen. Vascular calcifications. IMPRESSION: Small soft tissue ulcer along the medial aspect of the 1st MTP joint. No radiographic findings of osteomyelitis. Electronically Signed   By: Charline Bills M.D.   On: 08/07/2023 18:27   DG Foot 2 Views Left  Result Date: 08/07/2023 CLINICAL DATA:  Chronic foot wounds, concern for osteo EXAM: LEFT FOOT - 2 VIEW COMPARISON:  None Available. FINDINGS: Soft tissue wound/ulcer along the plantar surface of the heel, adjacent to the calcaneus. Flattening of the talus with pes planus. Subtle cortical lucency medially along the anterior talus versus anterior process of the calcaneus on the frontal radiograph. While this could reflect osteomyelitis, it is not well visualized on the lateral view. Superimposed soft tissue gas related to the large ulcer could also lead to this appearance. No fracture is seen. Mild to moderate dorsal soft tissue swelling. IMPRESSION: Soft tissue wound/ulcer along the  plantar surface of the heel, adjacent to the calcaneus. Possible subtle cortical lucency along the anterior calcaneus versus talus, equivocal. Osteomyelitis is not excluded. Electronically Signed   By: Charline Bills M.D.   On: 08/07/2023 18:24   VAS [place] ABI WITH/WO TBI  Result Date: 08/07/2023  LOWER EXTREMITY DOPPLER STUDY Patient Name:  JOEVANNY NABOZNY  Date of Exam:   08/07/2023 Medical Rec #:  [id number]          Accession #:    [id number] Date of Birth: 09-28-1950          Patient Gender: M Patient Age:   74 years Exam Location:  [facility] Procedure:      VAS [place] ABI WITH/WO TBI Referring Phys: Evlyn Kanner --------------------------------------------------------------------------------  Indications: BLE DM foot infection/wounds High Risk Factors: Hypertension, hyperlipidemia, Diabetes, no history of                    smoking, coronary artery disease. Other Factors: CHF,.  Limitations: Today's exam was limited due to venous interference and hyperemic              flow. Comparison Study: No previous exams Performing Technologist: Ernestene Mention RVT/RDMS  Examination Guidelines: A complete evaluation includes at minimum, Doppler waveform signals and systolic blood pressure reading at the level of bilateral brachial, anterior tibial, and posterior tibial arteries, when vessel segments are accessible. Bilateral testing is considered an integral part of a complete examination. Photoelectric Plethysmograph (PPG) waveforms and toe systolic pressure readings are included as required and additional duplex testing as needed. Limited examinations for reoccurring indications may be performed as noted.  ABI Findings: +---------+------------------+-----+-------------------+--------+ Right    Rt Pressure (mmHg)IndexWaveform           Comment  +---------+------------------+-----+-------------------+--------+ Brachial 128                    biphasic                     +---------+------------------+-----+-------------------+--------+ PTA      137               1.07 audibly multiphasic         +---------+------------------+-----+-------------------+--------+ DP       125               0.98 audibly multiphasic         +---------+------------------+-----+-------------------+--------+ Great Toe67                0.52 Abnormal                    +---------+------------------+-----+-------------------+--------+ +---------+------------------+-----+-------------------+-------+ Left     Lt Pressure (mmHg)IndexWaveform           Comment +---------+------------------+-----+-------------------+-------+ Brachial 116                    triphasic                  +---------+------------------+-----+-------------------+-------+ PTA      135               1.05 audibly multiphasic        +---------+------------------+-----+-------------------+-------+ DP       126               0.98 audibly multiphasic        +---------+------------------+-----+-------------------+-------+ Great Toe51                0.40 Abnormal                   +---------+------------------+-----+-------------------+-------+ +-------+-----------+-----------+------------+------------+ ABI/TBIToday's ABIToday's TBIPrevious ABIPrevious TBI +-------+-----------+-----------+------------+------------+ Right  1.07       0.52                                +-------+-----------+-----------+------------+------------+ Left  1.05       0.40                                +-------+-----------+-----------+------------+------------+ Accurate doppler signals difficult to obtain due to venous interference and hyperemic flow from infection.  Summary: Right: Resting right ankle-brachial index is within normal range. The right toe-brachial index is abnormal. Left: Resting left ankle-brachial index is within normal range. The left toe-brachial index is abnormal. *See table(s) above  for measurements and observations.  Electronically signed by Carolynn Sayers on 08/07/2023 at 4:54:52 PM.    Final    CT Angio Chest PE W and/or Wo Contrast  Result Date: 08/07/2023 CLINICAL DATA:  Chest pain. EXAM: CT ANGIOGRAPHY CHEST WITH CONTRAST TECHNIQUE: Multidetector CT imaging of the chest was performed using the standard protocol during bolus administration of intravenous contrast. Multiplanar CT image reconstructions and MIPs were obtained to evaluate the vascular anatomy. RADIATION DOSE REDUCTION: This exam was performed according to the departmental dose-optimization program which includes automated exposure control, adjustment of the mA and/or kV according to patient size and/or use of iterative reconstruction technique. CONTRAST:  60mL OMNIPAQUE IOHEXOL 350 MG/ML SOLN COMPARISON:  July 19, 2021. FINDINGS: Cardiovascular: Satisfactory opacification of the pulmonary arteries to the segmental level. No evidence of pulmonary embolism. Normal heart size. No pericardial effusion. Coronary artery calcifications are noted. Mediastinum/Nodes: No enlarged mediastinal, hilar, or axillary lymph nodes. Thyroid gland, trachea, and esophagus demonstrate no significant findings. Lungs/Pleura: Lungs are clear. No pleural effusion or pneumothorax. Upper Abdomen: No acute abnormality. Musculoskeletal: No chest wall abnormality. No acute or significant osseous findings. Review of the MIP images confirms the above findings. IMPRESSION: No definite evidence of pulmonary embolus. Coronary artery calcifications are noted suggesting coronary artery disease. Aortic Atherosclerosis (ICD10-I70.0). Electronically Signed   By: Lupita Raider M.D.   On: 08/07/2023 15:49   DG Chest Port 1 View  Result Date: 08/07/2023 CLINICAL DATA:  Chest pain. EXAM: PORTABLE CHEST 1 VIEW COMPARISON:  07/09/2020. FINDINGS: Bilateral lung fields are clear. Note is made of elevated right hemidiaphragm. Bilateral costophrenic angles are  clear. Normal cardio-mediastinal silhouette. No acute osseous abnormalities. The soft tissues are within normal limits. IMPRESSION: No active disease. Electronically Signed   By: Jules Schick M.D.   On: 08/07/2023 12:21       Lakrisha Iseman M.D. [facility] 08/08/2023, 11:16 AM  Available via Epic secure chat 7am-7pm After 7 pm, please refer to night coverage provider listed on amion.

## 2023-08-08 NOTE — Progress Notes (Addendum)
Patient's wife arrived. With her present, patient agreeable to IV placement. IV placed by IV team and antibiotic started.

## 2023-08-08 NOTE — Progress Notes (Signed)
Patient's wife returned phone call. States this is not normal for him and he is, "usually the most pleasant man." Wife transferred to room to speak with patient, patient refusing to hold phone, repeatedly telling this RN to "get away from him."  Wife states she will be here as soon as possible.

## 2023-08-08 NOTE — Progress Notes (Signed)
Echo attempted. Pt refused. Informed RN.

## 2023-08-09 ENCOUNTER — Other Ambulatory Visit (HOSPITAL_COMMUNITY): Payer: 59

## 2023-08-09 DIAGNOSIS — J9601 Acute respiratory failure with hypoxia: Secondary | ICD-10-CM | POA: Diagnosis not present

## 2023-08-09 DIAGNOSIS — N179 Acute kidney failure, unspecified: Secondary | ICD-10-CM | POA: Diagnosis not present

## 2023-08-09 DIAGNOSIS — L97511 Non-pressure chronic ulcer of other part of right foot limited to breakdown of skin: Secondary | ICD-10-CM | POA: Diagnosis not present

## 2023-08-09 DIAGNOSIS — I998 Other disorder of circulatory system: Secondary | ICD-10-CM | POA: Diagnosis not present

## 2023-08-09 DIAGNOSIS — R7989 Other specified abnormal findings of blood chemistry: Secondary | ICD-10-CM | POA: Diagnosis not present

## 2023-08-09 LAB — CBC
HCT: 26.8 % — ABNORMAL LOW (ref 39.0–52.0)
Hemoglobin: 9 g/dL — ABNORMAL LOW (ref 13.0–17.0)
MCH: 31.1 pg (ref 26.0–34.0)
MCHC: 33.6 g/dL (ref 30.0–36.0)
MCV: 92.7 fL (ref 80.0–100.0)
Platelets: 321 10*3/uL (ref 150–400)
RBC: 2.89 MIL/uL — ABNORMAL LOW (ref 4.22–5.81)
RDW: 12.2 % (ref 11.5–15.5)
WBC: 11.7 10*3/uL — ABNORMAL HIGH (ref 4.0–10.5)
nRBC: 0 % (ref 0.0–0.2)

## 2023-08-09 LAB — GLUCOSE, CAPILLARY
Glucose-Capillary: 148 mg/dL — ABNORMAL HIGH (ref 70–99)
Glucose-Capillary: 98 mg/dL (ref 70–99)
Glucose-Capillary: 69 mg/dL — ABNORMAL LOW (ref 70–99)
Glucose-Capillary: 101 mg/dL — ABNORMAL HIGH (ref 70–99)

## 2023-08-09 LAB — BASIC METABOLIC PANEL
Anion gap: 9 (ref 5–15)
BUN: 29 mg/dL — ABNORMAL HIGH (ref 8–23)
CO2: 28 mmol/L (ref 22–32)
Calcium: 8.2 mg/dL — ABNORMAL LOW (ref 8.9–10.3)
Chloride: 103 mmol/L (ref 98–111)
Creatinine, Ser: 3.29 mg/dL — ABNORMAL HIGH (ref 0.61–1.24)
GFR, Estimated: 19 mL/min — ABNORMAL LOW (ref >60)
Glucose, Bld: 190 mg/dL — ABNORMAL HIGH (ref 70–99)
Potassium: 2.8 mmol/L — ABNORMAL LOW (ref 3.5–5.1)
Sodium: 140 mmol/L (ref 135–145)

## 2023-08-09 MED ORDER — PIPERACILLIN-TAZOBACTAM IN DEX 2-0.25 GM/50ML IV SOLN
2.2500 g | Freq: Three times a day (TID) | INTRAVENOUS | Status: DC
  Administered 2023-08-09 – 2023-08-10 (×4): 2.25 g via INTRAVENOUS
  Filled 2023-08-09 (×7): qty 50

## 2023-08-09 MED ORDER — SODIUM CHLORIDE 0.9 % IV SOLN
1.5000 g | INTRAVENOUS | Status: AC
  Filled 2023-08-09: qty 1.5

## 2023-08-09 MED ORDER — VANCOMYCIN HCL IN DEXTROSE 1-5 GM/200ML-% IV SOLN: 1000.0000 mg | INTRAVENOUS | Status: DC

## 2023-08-09 NOTE — Plan of Care (Signed)
  Problem: Pain Management: Goal: General experience of comfort will improve Outcome: Progressing   Problem: Safety: Goal: Ability to remain free from injury will improve Outcome: Progressing

## 2023-08-09 NOTE — Progress Notes (Signed)
  Daily Progress Note  S/p:n/a  Subjective: No complaints this morning, spoke with wife yesterday she understands plan for angiogram tomorrow and agrees to proceed.  Objective: Vitals:   08/08/23 2100 08/09/23 0441  BP: (!) 162/66 (!) 140/61  Pulse: 66 (!) 58  Resp: 17 18  Temp: 99.5 F (37.5 C) (!) 101.2 F (38.4 C)  SpO2: 97% 93%    Physical Examination No acute distress Hemodynamically stable Lower extremity dressings clean dry and intact  MRI left foot with possible early osteomyelitis calcaneus MRI right foot without signs of osteomyelitis  ASSESSMENT/PLAN:  72 y.o. male with chronic limb threatening ischemia of bilateral lower extremities.  I explained to the patient and the wife on the phone yesterday that he is at high risk of bilateral amputation.  Offered bilateral angiogram with possible intervention, reviewed the risks and benefits are both willing to proceed.   N.p.o. midnight Consent ordered   Daria Pastures MD MS [facility] [phone] 08/09/2023  8:45 AM

## 2023-08-09 NOTE — Plan of Care (Signed)
  Problem: Education: Goal: Ability to describe self-care measures that may prevent or decrease complications (Diabetes Survival Skills Education) will improve Outcome: Progressing   Problem: Activity: Goal: Risk for activity intolerance will decrease Outcome: Progressing   Problem: Nutrition: Goal: Adequate nutrition will be maintained Outcome: Progressing

## 2023-08-09 NOTE — Consult Note (Signed)
WOC Nurse Consult Note: this consult performed remotely after review of EMR including photo documentation  Reason for Consult: gangrene B feet  Wound type: full thickness dry gangrene  Pressure Injury POA: NA  Measurement: see nursing flowsheet  Wound bed: L heel and L medial foot/ankle with dry black eschar (MRI revealed ? Osteomyelitis of calcaneus); R medial foot dry black eschar with some surrounding pink tissue  Drainage (amount, consistency, odor)  foul purulent per MD note  Periwound: Dressing procedure/placement/frequency: Clean L heel/foot wounds and R foot wound with soap and water, dry and apply Xeroform gauze [place] [phone]) to wound beds daily. Cover with dry gauze and Kerlix roll gauze.    These wounds are out of the scope of the WOC nurse.  Vascular is following patient and plans angiogram 08/10/2023.   Further management of these wounds to be provided by vascular surgeon.   WOC team will not follow. Re-consult if further needs arise.   Thank you,    Priscella Mann MSN, RN-BC, [employer] [phone]

## 2023-08-09 NOTE — Progress Notes (Signed)
[facility]                                                                              Earl Calderon, is a 72 y.o. male, DOB - [birth year], QVZ:[id number] Admit date - 08/07/2023    Outpatient Primary MD for the patient is [name], [name], MD  LOS - 2  days  Chief Complaint  Patient presents with   Chest Pain       Brief summary   Patient is a 72 year old male with CAD, CKD stage III, DM type II, uncontrolled, MGUS, chronic systolic CHF, NSVT, resistant hypertension, hyperlipidemia presented to ED with shortness of breath.  Per admission history, patient has been complaining of progressive shortness of breath in the last 2 weeks, generalized anterior chest pain with efforts of breathing, no fevers, cough or leg swelling. He also reported chronic leg wounds and have been worsening for the last couple of weeks, more painful. In ED, patient was noted to be hypoxic on attempts at ambulation.    Assessment & Plan    Principal Problem:   Acute respiratory failure with hypoxia (HCC) -Patient had presented initially for shortness of breath and hypoxia -Unclear etiology, CTA chest showed no evidence of pulmonary embolus, aortic atherosclerosis, coronary artery calcifications.  No pleural effusion or pneumothorax. -2D echo ending -Improving, O2 sats 100% on room air currently O2 sats 92 to 94%  Active Problems: Bilateral lower extremity wounds, gangrene, chronic limb ischemia -Foul-smelling black margin ulcers on examination - MRI right foot showed soft tissue ulceration along the medial aspect of first MTP joint, no osteomyelitis or abscess, nonspecific marrow edema within the second metatarsal head with questionable underlying linear subchondral low signal, could reflect Freiberg infarction or stress fracture -MRI left foot showed soft tissue ulceration along the plantar aspect of the hindfoot with underlying subcutaneous edema, soft tissue emphysema  consistent with soft tissue infection, possible necrotizing infection.  HSN mild calcaneal marrow T2 hyperintensity in the calcaneal tuberosity could represent hyperemia or early osteomyelitis -Continue IV vancomycin, cefuroxime, follow blood cultures -Vascular surgery consulted  Acute metabolic encephalopathy -May have underlying dementia.  CT head in 04/2022 had shown atrophy with chronic microvascular disease -UA showed> 500 glucose, small Hgb, otherwise negative for UTI -CT head showed no acute intracranial abnormality -B12 3571, folate 12.1, TSH 2.4, B1 pending     Chronic combined systolic and diastolic heart failure (HCC) -Follow 2D echo.  Previous echo 12/2022 had shown EF of 45 to 50% with G1 DD -BNP 200.5,  Acute kidney injury on CKD stage IIIa -Baseline creatinine 1.2 -Presented with creatinine of 1.78, lactic acidosis -Hold Entresto, Aldactone, furosemide    Type 2 diabetes mellitus with chronic kidney disease, with long-term current use of insulin (HCC) -Uncontrolled with hyperglycemia -Hemoglobin A1c 9.1 CBG (last 3)  Recent Labs    08/08/23 2102 08/09/23 0845 08/09/23 1252  GLUCAP 308* 148* 98   -Continue moderate sliding scale insulin, SSI  Prolonged QTc -EKG on admission shows QTc of 522 -Avoid QT prolonging meds     MGUS (monoclonal gammopathy of unknown significance) -Outpatient follow with Dr. [name], last seen on 12/05/22  Estimated body mass index is 20.81 kg/m as calculated from the following:   Height as of this encounter: 5' 9.5" (1.765 m).   Weight as of this encounter: 64.9 kg.  Code Status: Full code DVT Prophylaxis:  enoxaparin (LOVENOX) injection 30 mg Start: 08/09/23 1000 SCDs Start: 08/07/23 2043   Level of Care: Level of care: Telemetry Medical Family Communication:  Disposition Plan:      Remains inpatient appropriate:      Procedures:    Consultants:   Vascular surgery  Antimicrobials:   Anti-infectives (From admission,  onward)    Start     Dose/Rate Route Frequency Ordered Stop   08/10/23 2200  vancomycin (VANCOCIN) IVPB 1000 mg/200 mL premix        1,000 mg 200 mL/hr over 60 Minutes Intravenous Every 48 hours 08/09/23 1047     08/10/23 0600  cefUROXime (ZINACEF) 1.5 g in sodium chloride 0.9 % 100 mL IVPB        1.5 g 200 mL/hr over 30 Minutes Intravenous On call to O.R. 08/09/23 0846 08/11/23 0559   08/09/23 1400  piperacillin-tazobactam (ZOSYN) IVPB 2.25 g        2.25 g 100 mL/hr over 30 Minutes Intravenous Every 8 hours 08/09/23 1050     08/08/23 2200  vancomycin (VANCOREADY) IVPB 750 mg/150 mL  Status:  Discontinued        750 mg 150 mL/hr over 60 Minutes Intravenous Every 24 hours 08/08/23 0015 08/09/23 1047   08/07/23 2330  Vancomycin (VANCOCIN) 1,500 mg in sodium chloride 0.9 % 500 mL IVPB        1,500 mg 250 mL/hr over 120 Minutes Intravenous  Once 08/07/23 2240 08/08/23 0740   08/07/23 2300  piperacillin-tazobactam (ZOSYN) IVPB 3.375 g  Status:  Discontinued        3.375 g 12.5 mL/hr over 240 Minutes Intravenous Every 8 hours 08/07/23 2240 08/09/23 1050   08/07/23 1715  cefTRIAXone (ROCEPHIN) 1 g in sodium chloride 0.9 % 100 mL IVPB        1 g 200 mL/hr over 30 Minutes Intravenous  Once 08/07/23 1707 08/07/23 1848          Medications  aspirin EC  81 mg Oral Daily   carvedilol  3.125 mg Oral BID WC   enoxaparin (LOVENOX) injection  30 mg Subcutaneous Q24H   insulin aspart  0-15 Units Subcutaneous TID WC   insulin aspart  0-5 Units Subcutaneous QHS   insulin detemir  8 Units Subcutaneous BID   rosuvastatin  40 mg Oral QHS   sodium chloride flush  3 mL Intravenous Q12H      Subjective:   Earl Calderon was seen and examined today.  Still spiking fever, 101.2 F.  No acute complaints, foul-smelling ulcers on the bilateral lower extremities.  Difficult to obtain review of system from the patient.   Objective:   Vitals:   08/08/23 0740 08/08/23 2100 08/09/23 0441 08/09/23  0848  BP: 124/63 (!) 162/66 (!) 140/61 (!) 123/58  Pulse: (!) 57 66 (!) 58 (!) 51  Resp: 18 17 18 18   Temp: 98.7 F (37.1 C) 99.5 F (37.5 C) (!) 101.2 F (38.4 C) 98.4 F (36.9 C)  TempSrc: Oral Oral Oral Oral  SpO2: 94% 97% 93% 100%  Weight:      Height:        Intake/Output Summary (Last 24 hours) at 08/09/2023 1340 Last data filed at 08/09/2023 1330 Gross per 24 hour  Intake 900 ml  Output 50 ml  Net 850 ml     Wt Readings from Last 3 Encounters:  08/07/23 64.9 kg  03/12/23 65 kg  12/29/22 66.1 kg   Physical Exam General: Alert and awake, NAD Cardiovascular: S1 S2 clear, RRR.  Respiratory: CTAB, no wheezing Gastrointestinal: Soft, nontender, nondistended, NBS Ext: no pedal edema bilaterally Skin: Lower extremity ulcers, foul-smelling, bilateral Psych:           Data Reviewed:  I have personally reviewed following labs    CBC Lab Results  Component Value Date   WBC 11.7 (H) 08/09/2023   RBC 2.89 (L) 08/09/2023   HGB 9.0 (L) 08/09/2023   HCT 26.8 (L) 08/09/2023   MCV 92.7 08/09/2023   MCH 31.1 08/09/2023   PLT 321 08/09/2023   MCHC 33.6 08/09/2023   RDW 12.2 08/09/2023   LYMPHSABS 1.1 12/25/2022   MONOABS 0.4 12/25/2022   EOSABS 0.1 12/25/2022   BASOSABS 0.0 12/25/2022     Last metabolic panel Lab Results  Component Value Date   NA 140 08/09/2023   K 2.8 (L) 08/09/2023   CL 103 08/09/2023   CO2 28 08/09/2023   BUN 29 (H) 08/09/2023   CREATININE 3.29 (H) 08/09/2023   GLUCOSE 190 (H) 08/09/2023   GFRNONAA 19 (L) 08/09/2023   GFRAA 78 09/26/2020   CALCIUM 8.2 (L) 08/09/2023   PHOS 2.7 11/11/2018   PROT 6.6 08/08/2023   ALBUMIN 1.9 (L) 08/08/2023   LABGLOB 4.0 (H) 12/25/2022   AGRATIO 0.9 12/25/2022   BILITOT 0.9 08/08/2023   ALKPHOS 59 08/08/2023   AST 14 (L) 08/08/2023   ALT 10 08/08/2023   ANIONGAP 9 08/09/2023    CBG (last 3)  Recent Labs    08/08/23 2102 08/09/23 0845 08/09/23 1252  GLUCAP 308* 148* 98       Coagulation Profile: Recent Labs  Lab 08/08/23 1823  INR 1.2     Radiology Studies: I have personally reviewed the imaging studies  CT HEAD WO CONTRAST ( )  Result Date: 08/08/2023 CLINICAL DATA:  Mental status change, unknown cause EXAM: CT HEAD WITHOUT CONTRAST TECHNIQUE: Contiguous axial images were obtained from the base of the skull through the vertex without intravenous contrast. RADIATION DOSE REDUCTION: This exam was performed according to the departmental dose-optimization program which includes automated exposure control, adjustment of the mA and/or kV according to patient size and/or use of iterative reconstruction technique. COMPARISON:  CT Head 04/22/22 FINDINGS: Brain: No evidence of acute infarction, hemorrhage, hydrocephalus, extra-axial collection or mass lesion/mass effect.Background of mild chronic microvascular ischemic change. 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 Vascular: No hyperdense vessel or unexpected calcification. Unchanged calcification along the left A2 segment. Skull: Normal. Negative for fracture or focal lesion. Sinuses/Orbits: No acute finding.  Left lens replacement. Other: None. IMPRESSION: No acute intracranial abnormality. Electronically Signed   By: Lorenza Cambridge M.D.   On: 08/08/2023 17:33   MR FOOT LEFT WO CONTRAST  Result Date: 08/08/2023 CLINICAL DATA:  Diabetic with chronic bilateral foot wounds. Left plantar hindfoot wound. Soft tissue infection suspected. Evaluate for osteomyelitis. EXAM: MRI OF THE LEFT FOOT WITHOUT CONTRAST TECHNIQUE: Multiplanar, multisequence MR imaging of the left hindfoot was performed. No intravenous contrast was administered. COMPARISON:  Radiographs 08/07/2023 and 04/21/2022. FINDINGS: Bones/Joint/Cartilage As demonstrated radiographically, there is soft tissue ulceration along the plantar aspect of the hindfoot with underlying heterogeneous edema and soft tissue emphysema throughout the subcutaneous fat. These soft  tissue changes abut the inferolateral aspect of the calcaneal tuberosity  and are associated with mild calcaneal marrow T2 hyperintensity. No definite cortical destruction on the T1 weighted images. No other suspicious or acute osseous findings identified within the hindfoot or midfoot. There are chronic subtalar and talonavicular degenerative changes and a mild pes planus deformity. No significant tibiotalar arthropathy or effusion. Ligaments The major ankle ligaments appear intact. Muscles and Tendons The ankle tendons appear intact without significant tenosynovitis. No focal muscular abnormalities are identified. Mild generalized muscular T2 hyperintensity, likely related to underlying diabetes. Soft tissues As above, soft tissue ulceration along the plantar aspect of the hindfoot with underlying subcutaneous edema and soft tissue emphysema as correlated with the earlier radiographs. No focal fluid collection identified. There is mild nonspecific dorsal subcutaneous edema. IMPRESSION: 1. Soft tissue ulceration along the plantar aspect of the hindfoot with underlying subcutaneous edema and soft tissue emphysema, consistent with soft tissue infection. The soft tissue gas suggest possible necrotizing infection. Correlate clinically. 2. Adjacent mild calcaneal marrow T2 hyperintensity in the calcaneal tuberosity without cortical destruction. This finding is nonspecific and could be secondary to hyperemia or early osteomyelitis. 3. No focal fluid collection identified. 4. Chronic subtalar and talonavicular degenerative changes. Electronically Signed   By: Carey Bullocks M.D.   On: 08/08/2023 08:08   MR FOOT RIGHT WO CONTRAST  Result Date: 08/08/2023 CLINICAL DATA:  Chronic bilateral foot wounds. Clinical concern for osteomyelitis. Soft tissue ulcer along the medial aspect of the 1st MTP joint. Soft tissue infection suspected. EXAM: MRI OF THE RIGHT FOREFOOT WITHOUT CONTRAST TECHNIQUE: Multiplanar, multisequence MR  imaging of the right forefoot was performed. No intravenous contrast was administered. COMPARISON:  Radiographs same date.  No other comparison studies. FINDINGS: Bones/Joint/Cartilage Mild soft tissue ulceration along the medial aspect of the 1st metatarsophalangeal joint. Underlying mild 1st metatarsal-phalangeal degenerative changes with joint space narrowing and osteophytes. No significant joint effusion or evidence of the underlying osteomyelitis. There is nonspecific marrow edema within the 2nd metatarsal head with questionable underlying linear subchondral low signal, best seen on sagittal image 16/9. This could reflect Freiberg infraction. Mild proximal extension of marrow edema into the 2nd metatarsal shaft. No significant joint effusion or abnormality of the adjacent 2nd proximal phalangeal base. The additional toes and metatarsals are normal. Normal alignment at the Lisfranc joint Ligaments Intact Lisfranc ligament. The collateral ligaments of the metatarsophalangeal joints appear intact. Muscles and Tendons The forefoot tendons appear intact. Mild edema and a small amount of ill-defined fluid along the flexor digitorum tendons within the forefoot. Mild nonspecific muscular atrophy and edema without focal fluid collection. Soft tissues As above, mild soft tissue ulceration along the medial aspect of the 1st metatarsophalangeal joint. No underlying focal fluid collection. Nonspecific dorsal subcutaneous edema. In correlation with radiographs, underlying vascular calcifications typical of diabetes. IMPRESSION: 1. Mild soft tissue ulceration along the medial aspect of the 1st metatarsophalangeal joint without evidence of underlying osteomyelitis or abscess. 2. Nonspecific marrow edema within the 2nd metatarsal head with questionable underlying linear subchondral low signal. This could reflect Freiberg infraction or a stress fracture. 3. Mild degenerative changes of the 1st metatarsophalangeal joint. 4. Mild  nonspecific edema and a small amount of ill-defined fluid along the flexor digitorum tendons and mild dorsal subcutaneous edema. No focal fluid collections. Electronically Signed   By: Carey Bullocks M.D.   On: 08/08/2023 07:58   DG Foot 2 Views Right  Result Date: 08/07/2023 CLINICAL DATA:  Chronic foot wounds, concern for osteo EXAM: RIGHT FOOT - 2 VIEW COMPARISON:  None Available. FINDINGS:  Small soft tissue ulcer along the medial aspect of the 1st MTP joint. No associated adjacent cortical destruction to suggest osteomyelitis. Mild soft tissue swelling/edema, most prominent along the dorsal forefoot. No fracture or dislocation is seen. Vascular calcifications. IMPRESSION: Small soft tissue ulcer along the medial aspect of the 1st MTP joint. No radiographic findings of osteomyelitis. Electronically Signed   By: Charline Bills M.D.   On: 08/07/2023 18:27   DG Foot 2 Views Left  Result Date: 08/07/2023 CLINICAL DATA:  Chronic foot wounds, concern for osteo EXAM: LEFT FOOT - 2 VIEW COMPARISON:  None Available. FINDINGS: Soft tissue wound/ulcer along the plantar surface of the heel, adjacent to the calcaneus. Flattening of the talus with pes planus. Subtle cortical lucency medially along the anterior talus versus anterior process of the calcaneus on the frontal radiograph. While this could reflect osteomyelitis, it is not well visualized on the lateral view. Superimposed soft tissue gas related to the large ulcer could also lead to this appearance. No fracture is seen. Mild to moderate dorsal soft tissue swelling. IMPRESSION: Soft tissue wound/ulcer along the plantar surface of the heel, adjacent to the calcaneus. Possible subtle cortical lucency along the anterior calcaneus versus talus, equivocal. Osteomyelitis is not excluded. Electronically Signed   By: Charline Bills M.D.   On: 08/07/2023 18:24   VAS [place] ABI WITH/WO TBI  Result Date: 08/07/2023  LOWER EXTREMITY DOPPLER STUDY Patient Name:   Earl Calderon  Date of Exam:   08/07/2023 Medical Rec #: [id number]          Accession #:    [id number] Date of Birth: 1951/01/30          Patient Gender: M Patient Age:   56 years Exam Location:  [facility] Procedure:      VAS [place] ABI WITH/WO TBI Referring Phys: Evlyn Kanner --------------------------------------------------------------------------------  Indications: BLE DM foot infection/wounds High Risk Factors: Hypertension, hyperlipidemia, Diabetes, no history of                    smoking, coronary artery disease. Other Factors: CHF,.  Limitations: Today's exam was limited due to venous interference and hyperemic              flow. Comparison Study: No previous exams Performing Technologist: Ernestene Mention RVT/RDMS  Examination Guidelines: A complete evaluation includes at minimum, Doppler waveform signals and systolic blood pressure reading at the level of bilateral brachial, anterior tibial, and posterior tibial arteries, when vessel segments are accessible. Bilateral testing is considered an integral part of a complete examination. Photoelectric Plethysmograph (PPG) waveforms and toe systolic pressure readings are included as required and additional duplex testing as needed. Limited examinations for reoccurring indications may be performed as noted.  ABI Findings: +---------+------------------+-----+-------------------+--------+ Right    Rt Pressure (mmHg)IndexWaveform           Comment  +---------+------------------+-----+-------------------+--------+ Brachial 128                    biphasic                    +---------+------------------+-----+-------------------+--------+ PTA      137               1.07 audibly multiphasic         +---------+------------------+-----+-------------------+--------+ DP       125               0.98 audibly multiphasic         +---------+------------------+-----+-------------------+--------+  Great Toe67                0.52 Abnormal                     +---------+------------------+-----+-------------------+--------+ +---------+------------------+-----+-------------------+-------+ Left     Lt Pressure (mmHg)IndexWaveform           Comment +---------+------------------+-----+-------------------+-------+ Brachial 116                    triphasic                  +---------+------------------+-----+-------------------+-------+ PTA      135               1.05 audibly multiphasic        +---------+------------------+-----+-------------------+-------+ DP       126               0.98 audibly multiphasic        +---------+------------------+-----+-------------------+-------+ Great Toe51                0.40 Abnormal                   +---------+------------------+-----+-------------------+-------+ +-------+-----------+-----------+------------+------------+ ABI/TBIToday's ABIToday's TBIPrevious ABIPrevious TBI +-------+-----------+-----------+------------+------------+ Right  1.07       0.52                                +-------+-----------+-----------+------------+------------+ Left   1.05       0.40                                +-------+-----------+-----------+------------+------------+ Accurate doppler signals difficult to obtain due to venous interference and hyperemic flow from infection.  Summary: Right: Resting right ankle-brachial index is within normal range. The right toe-brachial index is abnormal. Left: Resting left ankle-brachial index is within normal range. The left toe-brachial index is abnormal. *See table(s) above for measurements and observations.  Electronically signed by Carolynn Sayers on 08/07/2023 at 4:54:52 PM.    Final    CT Angio Chest PE W and/or Wo Contrast  Result Date: 08/07/2023 CLINICAL DATA:  Chest pain. EXAM: CT ANGIOGRAPHY CHEST WITH CONTRAST TECHNIQUE: Multidetector CT imaging of the chest was performed using the standard protocol during bolus administration of intravenous  contrast. Multiplanar CT image reconstructions and MIPs were obtained to evaluate the vascular anatomy. RADIATION DOSE REDUCTION: This exam was performed according to the departmental dose-optimization program which includes automated exposure control, adjustment of the mA and/or kV according to patient size and/or use of iterative reconstruction technique. CONTRAST:  60mL OMNIPAQUE IOHEXOL 350 MG/ML SOLN COMPARISON:  July 19, 2021. FINDINGS: Cardiovascular: Satisfactory opacification of the pulmonary arteries to the segmental level. No evidence of pulmonary embolism. Normal heart size. No pericardial effusion. Coronary artery calcifications are noted. Mediastinum/Nodes: No enlarged mediastinal, hilar, or axillary lymph nodes. Thyroid gland, trachea, and esophagus demonstrate no significant findings. Lungs/Pleura: Lungs are clear. No pleural effusion or pneumothorax. Upper Abdomen: No acute abnormality. Musculoskeletal: No chest wall abnormality. No acute or significant osseous findings. Review of the MIP images confirms the above findings. IMPRESSION: No definite evidence of pulmonary embolus. Coronary artery calcifications are noted suggesting coronary artery disease. Aortic Atherosclerosis (ICD10-I70.0). Electronically Signed   By: Lupita Raider M.D.   On: 08/07/2023 15:49       Ricarda Atayde M.D. [facility] 08/09/2023, 1:40 PM  Available via Epic secure chat 7am-7pm After 7 pm, please refer to night coverage provider listed on amion.

## 2023-08-09 NOTE — Progress Notes (Signed)
Pharmacy Antibiotic Note  Earl Calderon is a 72 y.o. male admitted on 08/07/2023 with  wound infection - gangrene of bilateral lower extremities .  Pharmacy has been consulted for Zosyn (piperacillin-tazobactam) and Vancomycin dosing.  WBC 11.7 afebrile Scr 1.79 >> 3.29 today (CrCl 19.2)  Signs of AKI, will empirically reduce vancomycin and Zosyn doses. Vascular planning bilateral angiogram.  Plan: Decrease to Zosyn 2.25 IV q8h Decrease to Vancomycin 1 g IV q48h (eAUC 485.9) Levels as indicated Monitor daily CBC, temp, SCr, and for clinical signs of improvement  F/u cultures and de-escalate antibiotics as able    Height: 5' 9.5" (176.5 cm) Weight: 64.9 kg (143 lb) (last documented weight) IBW/kg (Calculated) : 71.85  Temp (24hrs), Avg:99.7 F (37.6 C), Min:98.4 F (36.9 C), Max:101.2 F (38.4 C)  Recent Labs  Lab 08/07/23 1102 08/07/23 1239 08/07/23 1449 08/07/23 2131 08/08/23 1823 08/09/23 0701  WBC 14.6*  --   --  12.9* 14.2* 11.7*  CREATININE 1.78*  --   --  1.76* 2.30* 3.29*  LATICACIDVEN  --  2.6* 1.4  --   --   --     Estimated Creatinine Clearance: 19.2 mL/min (A) (by C-G formula based on SCr of 3.29 mg/dL (H)).    No Known Allergies  Antimicrobials this admission: Ceftriaxone 12/6 x1 Vancomycin 12/6 >>  Zosyn 12/6 >>   Dose adjustments this admission: Vanc 750mg  q24h > 1000 mg q48h d/t Scr increase  Microbiology results: 12/6 Bcx x2: no growth x2 days  Thank you for allowing pharmacy to be a part of this patient's care.  Lora Paula, PharmD PGY-2 Infectious Diseases Pharmacy Resident [facility] 08/09/2023 10:47 AM

## 2023-08-10 ENCOUNTER — Encounter (HOSPITAL_COMMUNITY)
Admission: EM | Disposition: A | Discharge status: Skilled Nursing Facility | Payer: Self-pay | Source: Home / Self Care | Attending: Internal Medicine

## 2023-08-10 ENCOUNTER — Inpatient Hospital Stay (HOSPITAL_COMMUNITY): Payer: 59

## 2023-08-10 DIAGNOSIS — I70235 Atherosclerosis of native arteries of right leg with ulceration of other part of foot: Secondary | ICD-10-CM | POA: Diagnosis not present

## 2023-08-10 DIAGNOSIS — R7989 Other specified abnormal findings of blood chemistry: Secondary | ICD-10-CM | POA: Diagnosis not present

## 2023-08-10 DIAGNOSIS — L97519 Non-pressure chronic ulcer of other part of right foot with unspecified severity: Secondary | ICD-10-CM

## 2023-08-10 DIAGNOSIS — I70245 Atherosclerosis of native arteries of left leg with ulceration of other part of foot: Secondary | ICD-10-CM | POA: Diagnosis not present

## 2023-08-10 DIAGNOSIS — L97511 Non-pressure chronic ulcer of other part of right foot limited to breakdown of skin: Secondary | ICD-10-CM | POA: Diagnosis not present

## 2023-08-10 DIAGNOSIS — N179 Acute kidney failure, unspecified: Secondary | ICD-10-CM | POA: Diagnosis not present

## 2023-08-10 DIAGNOSIS — J9601 Acute respiratory failure with hypoxia: Secondary | ICD-10-CM | POA: Diagnosis not present

## 2023-08-10 HISTORY — PX: ABDOMINAL AORTOGRAM W/LOWER EXTREMITY: CATH118223

## 2023-08-10 LAB — CBC
HCT: 28.1 % — ABNORMAL LOW (ref 39.0–52.0)
Hemoglobin: 9.1 g/dL — ABNORMAL LOW (ref 13.0–17.0)
MCH: 30.5 pg (ref 26.0–34.0)
MCHC: 32.4 g/dL (ref 30.0–36.0)
MCV: 94.3 fL (ref 80.0–100.0)
Platelets: 359 10*3/uL (ref 150–400)
RBC: 2.98 MIL/uL — ABNORMAL LOW (ref 4.22–5.81)
RDW: 12.4 % (ref 11.5–15.5)
WBC: 12.7 10*3/uL — ABNORMAL HIGH (ref 4.0–10.5)
nRBC: 0 % (ref 0.0–0.2)

## 2023-08-10 LAB — GLUCOSE, CAPILLARY
Glucose-Capillary: 106 mg/dL — ABNORMAL HIGH (ref 70–99)
Glucose-Capillary: 67 mg/dL — ABNORMAL LOW (ref 70–99)
Glucose-Capillary: 62 mg/dL — ABNORMAL LOW (ref 70–99)

## 2023-08-10 LAB — BASIC METABOLIC PANEL
Anion gap: 13 (ref 5–15)
BUN: 36 mg/dL — ABNORMAL HIGH (ref 8–23)
CO2: 26 mmol/L (ref 22–32)
Calcium: 8.5 mg/dL — ABNORMAL LOW (ref 8.9–10.3)
Chloride: 100 mmol/L (ref 98–111)
Creatinine, Ser: 5 mg/dL — ABNORMAL HIGH (ref 0.61–1.24)
GFR, Estimated: 12 mL/min — ABNORMAL LOW (ref >60)
Glucose, Bld: 106 mg/dL — ABNORMAL HIGH (ref 70–99)
Potassium: 3 mmol/L — ABNORMAL LOW (ref 3.5–5.1)
Sodium: 139 mmol/L (ref 135–145)

## 2023-08-10 SURGERY — ABDOMINAL AORTOGRAM W/LOWER EXTREMITY
Anesthesia: LOCAL

## 2023-08-10 MED ORDER — VANCOMYCIN VARIABLE DOSE PER UNSTABLE RENAL FUNCTION (PHARMACIST DOSING): Status: DC

## 2023-08-10 MED ORDER — HEPARIN (PORCINE) IN NACL 1000-0.9 UT/500ML-% IV SOLN
INTRAVENOUS | Status: DC | PRN
  Administered 2023-08-10 (×2): 500 mL

## 2023-08-10 MED ORDER — INSULIN ASPART 100 UNIT/ML IJ SOLN
0.0000 [IU] | Freq: Three times a day (TID) | INTRAMUSCULAR | Status: DC
  Administered 2023-08-11 (×2): 2 [IU] via SUBCUTANEOUS
  Administered 2023-08-12 – 2023-08-13 (×2): 1 [IU] via SUBCUTANEOUS
  Administered 2023-08-13 – 2023-08-15 (×4): 3 [IU] via SUBCUTANEOUS
  Administered 2023-08-15: 5 [IU] via SUBCUTANEOUS
  Administered 2023-08-16: 1 [IU] via SUBCUTANEOUS
  Administered 2023-08-17 (×3): 2 [IU] via SUBCUTANEOUS
  Administered 2023-08-18 – 2023-08-20 (×3): 1 [IU] via SUBCUTANEOUS
  Administered 2023-08-21: 2 [IU] via SUBCUTANEOUS
  Administered 2023-08-24 – 2023-08-26 (×4): 1 [IU] via SUBCUTANEOUS
  Administered 2023-08-29 – 2023-08-30 (×4): 2 [IU] via SUBCUTANEOUS
  Administered 2023-08-31: 1 [IU] via SUBCUTANEOUS
  Administered 2023-08-31: 3 [IU] via SUBCUTANEOUS
  Administered 2023-09-01 – 2023-09-03 (×4): 1 [IU] via SUBCUTANEOUS

## 2023-08-10 MED ORDER — ACETAMINOPHEN 325 MG PO TABS
ORAL_TABLET | ORAL | Status: AC
  Administered 2023-08-10: 650 mg via ORAL
  Filled 2023-08-10: qty 2

## 2023-08-10 MED ORDER — POTASSIUM CHLORIDE CRYS ER 20 MEQ PO TBCR
40.0000 meq | EXTENDED_RELEASE_TABLET | Freq: Once | ORAL | Status: AC
  Administered 2023-08-10: 40 meq via ORAL
  Filled 2023-08-10: qty 2

## 2023-08-10 MED ORDER — DEXTROSE 50 % IV SOLN
INTRAVENOUS | Status: AC
  Filled 2023-08-10: qty 50

## 2023-08-10 MED ORDER — FENTANYL CITRATE (PF) 100 MCG/2ML IJ SOLN
INTRAMUSCULAR | Status: AC
  Filled 2023-08-10: qty 2

## 2023-08-10 MED ORDER — MIDAZOLAM HCL 2 MG/2ML IJ SOLN
INTRAMUSCULAR | Status: AC
  Filled 2023-08-10: qty 2

## 2023-08-10 MED ORDER — FENTANYL CITRATE (PF) 100 MCG/2ML IJ SOLN
INTRAMUSCULAR | Status: DC | PRN
  Administered 2023-08-10: 50 ug via INTRAVENOUS

## 2023-08-10 MED ORDER — FLUMAZENIL 1 MG/10ML IV SOLN
INTRAVENOUS | Status: AC
  Filled 2023-08-10: qty 10

## 2023-08-10 MED ORDER — FLUMAZENIL 1 MG/10ML IV SOLN
INTRAVENOUS | Status: DC | PRN
  Administered 2023-08-10 (×2): .2 mg via INTRAVENOUS

## 2023-08-10 MED ORDER — MIDAZOLAM HCL 2 MG/2ML IJ SOLN
INTRAMUSCULAR | Status: DC | PRN
  Administered 2023-08-10: 1 mg via INTRAVENOUS

## 2023-08-10 MED ORDER — LIDOCAINE 5 % EX PTCH
1.0000 | MEDICATED_PATCH | CUTANEOUS | Status: DC
  Administered 2023-08-10 – 2023-09-01 (×10): 1 via TRANSDERMAL
  Filled 2023-08-10 (×17): qty 1

## 2023-08-10 MED ORDER — LIDOCAINE HCL (PF) 1 % IJ SOLN
INTRAMUSCULAR | Status: AC
  Filled 2023-08-10: qty 30

## 2023-08-10 MED ORDER — INSULIN ASPART 100 UNIT/ML IJ SOLN: 0.0000 [IU] | INTRAMUSCULAR | Status: DC

## 2023-08-10 MED ORDER — NALOXONE HCL 0.4 MG/ML IJ SOLN
INTRAMUSCULAR | Status: AC
  Filled 2023-08-10: qty 1

## 2023-08-10 MED ORDER — LIDOCAINE HCL (PF) 1 % IJ SOLN
INTRAMUSCULAR | Status: DC | PRN
  Administered 2023-08-10: 10 mL

## 2023-08-10 MED ORDER — SODIUM CHLORIDE 0.9 % IV SOLN: INTRAVENOUS | Status: DC

## 2023-08-10 MED ORDER — NALOXONE HCL 0.4 MG/ML IJ SOLN
INTRAMUSCULAR | Status: DC | PRN
  Administered 2023-08-10: .4 mg via INTRAVENOUS

## 2023-08-10 SURGICAL SUPPLY — 12 items
CATH OMNI FLUSH 5F 65CM (CATHETERS) IMPLANT
CLOSURE MYNX CONTROL 5F (Vascular Products) IMPLANT
COVER DOME SNAP 22 D (MISCELLANEOUS) IMPLANT
KIT ANGIASSIST CO2 SYSTEM (KITS) IMPLANT
KIT MICROPUNCTURE NIT STIFF (SHEATH) IMPLANT
KIT PV (KITS) ×1 IMPLANT
SET ATX-X65L (MISCELLANEOUS) IMPLANT
SHEATH PINNACLE 5F 10CM (SHEATH) IMPLANT
SHEATH PROBE COVER 6X72 (BAG) IMPLANT
TRANSDUCER W/STOPCOCK (MISCELLANEOUS) ×1 IMPLANT
TRAY PV CATH (CUSTOM PROCEDURE TRAY) ×1 IMPLANT
WIRE STARTER BENTSON 035X150 (WIRE) IMPLANT

## 2023-08-10 NOTE — Progress Notes (Signed)
Admission: Arrived from cath lab. Pt responds to voice, v/s per protocol- bradycardic, room air. L groin C/D/I level 0 CCMD notified

## 2023-08-10 NOTE — Progress Notes (Addendum)
Pt arrived to cath lab holding area at 1230 s/p code blue. Pt was given reversal agent in PV lab; per protocol and cath lab manager Carlene Coria., pt to be monitored for 1.5 hours in holding area. Pt monitored 1:1 by this RN for first 30 min. No adverse effects from sedation noted. Pt remains in holding area until 1400 per protocol.

## 2023-08-10 NOTE — Progress Notes (Addendum)
Patient refused signing the consent form for Angiogram.RN spoke to the wife and allowed to sign for him,witnessed by Malva Cogan, Charity fundraiser. Pt also refused having his wound clean and asked RN to leave him alone. Will continue to monitor.

## 2023-08-10 NOTE — Inpatient Diabetes Management (Addendum)
Inpatient Diabetes Program Recommendations  AACE/ADA: New Consensus Statement on Inpatient Glycemic Control (2015)  Target Ranges:  Prepandial:   less than 140 mg/dL      Peak postprandial:   less than 180 mg/dL (1-2 hours)      Critically ill patients:  140 - 180 mg/dL   Lab Results  Component Value Date   GLUCAP 106 (H) 08/10/2023   HGBA1C 9.1 (H) 08/07/2023    Review of Glycemic Control  Latest Reference Range & Units 08/09/23 08:45 08/09/23 12:52 08/09/23 20:40 08/09/23 22:46 08/10/23 07:39  Glucose-Capillary 70 - 99 mg/dL 295 (H) 98 69 (L) 284 (H) 106 (H)   Diabetes history: DM 2 Outpatient Diabetes medications:  Humalog- stopped taking due to it "elevating" blood sugars Levemir 8 units tid with meals ??? Current orders for Inpatient glycemic control:  Novolog moderate tid with meals and HS Levemir 8 units bid  Inpatient Diabetes Program Recommendations:    Consider reducing Novolog  correction to very sensitive (0-6 units) q 4 hours while NPO.  Also may need to reduce Levemir to 6 units bid.   Addendum 1330- pt in procedure today.  Will follow-up on 12/10 regarding insulins and DM management.   Thanks,  Beryl Meager, RN, BC-ADM Inpatient Diabetes Coordinator Pager [phone]  (8a-5p)

## 2023-08-10 NOTE — Progress Notes (Addendum)
  Progress Note    08/10/2023 8:01 AM * No surgery date entered *  Subjective:  no complaints   Vitals:   08/10/23 0455 08/10/23 0736  BP: (!) 151/60 (!) 151/61  Pulse: 60 (!) 59  Resp: 17 16  Temp: 98.8 F (37.1 C) 99.9 F (37.7 C)  SpO2: 97% 95%   Physical Exam: Lungs:  non labored Extremities:  dressings left in place BLE Neurologic: a&O  CBC    Component Value Date/Time   WBC 11.7 (H) 08/09/2023 0701   RBC 2.89 (L) 08/09/2023 0701   HGB 9.0 (L) 08/09/2023 0701   HGB 11.6 (L) 12/25/2022 1001   HGB 12.2 (L) 01/16/2021 1705   HCT 26.8 (L) 08/09/2023 0701   HCT 35.8 (L) 01/16/2021 1705   PLT 321 08/09/2023 0701   PLT 156 12/25/2022 1001   PLT 174 01/16/2021 1705   MCV 92.7 08/09/2023 0701   MCV 93 01/16/2021 1705   MCH 31.1 08/09/2023 0701   MCHC 33.6 08/09/2023 0701   RDW 12.2 08/09/2023 0701   RDW 12.1 01/16/2021 1705   LYMPHSABS 1.1 12/25/2022 1001   MONOABS 0.4 12/25/2022 1001   EOSABS 0.1 12/25/2022 1001   BASOSABS 0.0 12/25/2022 1001    BMET    Component Value Date/Time   NA 140 08/09/2023 0701   NA 148 (H) 11/25/2021 1521   K 2.8 (L) 08/09/2023 0701   CL 103 08/09/2023 0701   CO2 28 08/09/2023 0701   GLUCOSE 190 (H) 08/09/2023 0701   BUN 29 (H) 08/09/2023 0701   BUN 19 11/25/2021 1521   CREATININE 3.29 (H) 08/09/2023 0701   CREATININE 1.28 (H) 12/25/2022 1001   CALCIUM 8.2 (L) 08/09/2023 0701   GFRNONAA 19 (L) 08/09/2023 0701   GFRNONAA >60 12/25/2022 1001   GFRAA 78 09/26/2020 1142    INR    Component Value Date/Time   INR 1.2 08/08/2023 1823     Intake/Output Summary (Last 24 hours) at 08/10/2023 0801 Last data filed at 08/10/2023 0300 Gross per 24 hour  Intake 250 ml  Output --  Net 250 ml     Assessment/Plan:  72 y.o. male with critical limb ischemia of BLE  Plan is for aortogram with BLE runoff and possible intervention today with Dr. Hetty Blend All questions answered and agreeable to proceed Patient is aware he is at an  elevated risk for BLE amputations Continue npo Consent ordered   Emilie Rutter, PA-C [facility] [phone] 08/10/2023 8:01 AM  VASCULAR STAFF ADDENDUM: I have independently interviewed and examined the patient. I agree with the above.  Aortogram with runoff today.  Daria Pastures MD [facility] Phone Number: [phone] 08/10/2023 10:23 AM

## 2023-08-10 NOTE — Progress Notes (Signed)
Vascular surgery update  Angiogram performed today demonstrates widely patent arterial vasculature with inline flow to the foot and without significant stenosis bilaterally. At this time there is no intervention to offer from a vascular surgery perspective.  Recommend continuing wound care and consider consult to orthopedics to discuss debridement versus possible amputation.  Daria Pastures MD [facility] of [facility] Phone Number: [phone] 08/10/2023 1:16 PM

## 2023-08-10 NOTE — Consult Note (Signed)
Nephrology Consult   Requesting provider: Thad Ranger Service requesting consult: Hospitalist Reason for consult: AKI on CKD 3a   Assessment/Recommendations: Earl Calderon is a/an 72 y.o. male with a past medical history CAD, CKD 3B, DM2, HFrEF (30-35% previously recently improved), HTN, HLD who present w/ hypoxia and foot wounds now c/b AKI   AKI on CKD 3a: BL around 1.3 but hard to say definitive. Unclear cause of AKI but acting like ATN possibly with some low BP on day of admission and possibly low BP at home. AIN or toxicity associated with vanc is also possible -continue supportive care; can continue IVFs -monitor UOP closely -Continue to monitor daily Cr, Dose meds for GFR -Monitor Daily I/Os, Daily weight  -Maintain MAP>65 for optimal renal perfusion.  -Avoid nephrotoxic medications including NSAIDs -Use synthetic opioids (Fentanyl/Dilaudid) if needed -Check Renal U/S to rule out obstruction -Currently no indication for HD but monitor closely -switch off zosyn if possible  PAD/lower extremity wounds: s/p arteriogram with [facility] today. May require amputation. Abx and mgmt per primary and [facility]  Acute hypoxic respiratory failure: improved with supplemental O2.  Management per primary.  HFrEF: does not appear overloaded. Echo pending. Monitor volume status closely  Hypertension: Seems controlled on current medications.  Continue to monitor  Uncontrolled Diabetes Mellitus Type 2 with Hyperglycemia: Management per primary  Hypokalemia: Replace orally as needed  Anemia: Likely multifactorial.  Transfuse as needed  AMS: Related to sedation on my exam but also some confusion during this hospitalization.  Likely delirium on dementia  Recommendations conveyed to primary service.    Darnell Level [facility] 08/10/2023 12:50 PM   _____________________________________________________________________________________ CC: chest pain  History of Present  Illness: Earl Calderon is a/an 72 y.o. male with a past medical history of CAD, CKD 3B, DM2, HFrEF, HTN, HLD who presents with chest pain.  The patient was altered after recently having his procedure.  History was largely obtained from chart review.  The patient presented 3 days ago with chest pain as well as shortness of breath that was worse with exertion.  He also noted worsening lower extremity wounds.  Most of the symptoms were chronic but on evaluation in the emergency department he was noted to be hypoxic with ambulation.  He was admitted for further evaluation with unclear etiology but improved with supplemental oxygen.  There was concern for gangrene as well as osteomyelitis on his lower extremities.  Vascular surgery was consulted and he was treated with antibiotics including vancomycin and Zosyn.  He underwent vascular procedure today which demonstrated no flow-limiting stenosis bilaterally but likely has microvascular disease.  Felt to be high risk for amputation.  He did suffer some respiratory arrest during the procedure but no cardiac arrest.  Patient was awake and alert when I saw him but confused likely related to sedation.  Patient's creatinine on arrival was 1.7.  Baseline seems to be around 1.3.  Did have some low blood pressures on the initial day he presented.  Was getting vancomycin but this was stopped.  Urinalysis largely unremarkable with minimal proteinuria.  Renal ultrasound pending.  He has been getting fluids but creatinine up to 5 today.   Medications:  Current Facility-Administered Medications  Medication Dose Route Frequency Provider Last Rate Last Admin   0.9 %  sodium chloride infusion   Intravenous Continuous Daria Pastures, MD 100 mL/hr at 08/10/23 1000 New Bag at 08/10/23 1000   [MAR Hold] acetaminophen (TYLENOL) tablet 650 mg  650 mg Oral  Q6H PRN Nolberto Hanlon, MD   650 mg at 08/09/23 0451   Or   [MAR Hold] acetaminophen (TYLENOL) suppository 650 mg  650 mg  Rectal Q6H PRN Nolberto Hanlon, MD       [MAR Hold] aspirin EC tablet 81 mg  81 mg Oral Daily Nolberto Hanlon, MD   81 mg at 08/10/23 0858   [MAR Hold] carvedilol (COREG) tablet 3.125 mg  3.125 mg Oral BID WC Nolberto Hanlon, MD   3.125 mg at 08/10/23 0857   [MAR Hold] cefUROXime (ZINACEF) 1.5 g in sodium chloride 0.9 % 100 mL IVPB  1.5 g Intravenous On Call to OR Lars Mage, PA-C       [facility] Hold] enoxaparin (LOVENOX) injection 30 mg  30 mg Subcutaneous Q24H Rai, Ripudeep K, MD   30 mg at 08/09/23 1033   fentaNYL (SUBLIMAZE) injection    PRN Daria Pastures, MD   50 mcg at 08/10/23 1130   flumazenil (ROMAZICON) injection    PRN Daria Pastures, MD   0.2 mg at 08/10/23 1142   Heparin (Porcine) in NaCl 1000-0.9 UT/500ML-% SOLN    PRN Daria Pastures, MD   500 mL at 08/10/23 1130   [MAR Hold] insulin aspart (novoLOG) injection 0-6 Units  0-6 Units Subcutaneous Q4H Rai, Ripudeep K, MD       [MAR Hold] insulin detemir (LEVEMIR) injection 8 Units  8 Units Subcutaneous BID Nolberto Hanlon, MD   8 Units at 08/10/23 0858   lidocaine (PF) (XYLOCAINE) 1 % injection    PRN Daria Pastures, MD   10 mL at 08/10/23 1131   [MAR Hold] LORazepam (ATIVAN) tablet 1 mg  1 mg Oral Q8H PRN Rai, Ripudeep K, MD       Or   [facility] Hold] LORazepam (ATIVAN) injection 1 mg  1 mg Intramuscular Q8H PRN Rai, Delene Ruffini, MD       midazolam (VERSED) injection    PRN Daria Pastures, MD   1 mg at 08/10/23 1130   naloxone [facility]) injection    PRN Daria Pastures, MD   0.4 mg at 08/10/23 1226   [MAR Hold] piperacillin-tazobactam (ZOSYN) IVPB 2.25 g  2.25 g Intravenous Q8H Rai, Ripudeep K, MD 100 mL/hr at 08/10/23 0505 2.25 g at 08/10/23 0505   [MAR Hold] polyethylene glycol (MIRALAX / GLYCOLAX) packet 17 g  17 g Oral Daily PRN Nolberto Hanlon, MD       [facility] Hold] sodium chloride flush (NS) 0.9 % injection 3 mL  3 mL Intravenous Q12H Nolberto Hanlon, MD   3 mL at 08/09/23 2143     ALLERGIES Patient has no known allergies.  MEDICAL HISTORY Past  Medical History:  Diagnosis Date   CAD in native artery 01/01/2021   Prior LAD PCI.  Currently no symptoms of ischemia.  Encouraged him to do some light exercising.  Continue aspirin, carvedilol, and rosuvastatin.   Cataract    CKD (chronic kidney disease), stage III (HCC) 08/24/2020   Coronary artery disease    Diabetes mellitus without complication (HCC)    on meds   Diabetic retinopathy (HCC)    Goals of care, counseling/discussion 07/01/2021   Hernia, inguinal    Hypertension    on meds   Hypertensive retinopathy    Lymphadenopathy, cervical 07/01/2021   MGUS (monoclonal gammopathy of unknown significance) 07/01/2021   NSVT (nonsustained ventricular tachycardia) (HCC) 08/24/2020   Pure hypercholesterolemia 08/05/2021   Resistant hypertension 11/11/2018   Uncontrolled type  2 diabetes mellitus 11/11/2018     SOCIAL HISTORY Social History   Socioeconomic History   Marital status: Married    Spouse name: Not on file   Number of children: Not on file   Years of education: Not on file   Highest education level: Not on file  Occupational History   Not on file  Tobacco Use   Smoking status: Never   Smokeless tobacco: Never  Vaping Use   Vaping status: Never Used  Substance and Sexual Activity   Alcohol use: Not Currently   Drug use: Never   Sexual activity: Not Currently  Other Topics Concern   Not on file  Social History Narrative   Not on file   Social Determinants of Health   Financial Resource Strain: Medium Risk (09/21/2020)   Overall Financial Resource Strain (CARDIA)    Difficulty of Paying Living Expenses: Somewhat hard  Food Insecurity: No Food Insecurity (08/07/2023)   Hunger Vital Sign    Worried About Running Out of Food in the Last Year: Never true    Ran Out of Food in the Last Year: Never true  Transportation Needs: No Transportation Needs (08/07/2023)   PRAPARE - Administrator, Civil Service (Medical): No    Lack of Transportation  (Non-Medical): No  Physical Activity: Not on file  Stress: Not on file  Social Connections: Not on file  Intimate Partner Violence: Not At Risk (08/07/2023)   Humiliation, Afraid, Rape, and Kick questionnaire    Fear of Current or Ex-Partner: No    Emotionally Abused: No    Physically Abused: No    Sexually Abused: No     FAMILY HISTORY Family History  Problem Relation Age of Onset   Hypertension Mother    Diabetes Mellitus II Mother    Arrhythmia Mother    Heart disease Mother    Hypertension Sister    Hypertension Brother    Heart disease Brother    Kidney disease Brother    Hypertension Other    Diabetes Mellitus II Other        All 9 sibblings have HTN   Colon cancer Neg Hx    Colon polyps Neg Hx    Esophageal cancer Neg Hx    Stomach cancer Neg Hx    Rectal cancer Neg Hx       Review of Systems: 12 systems reviewed Otherwise as per HPI, all other systems reviewed and negative  Physical Exam: Vitals:   08/10/23 1238 08/10/23 1245  BP: (!) 162/71 (!) 150/54  Pulse: (!) 47 (!) 47  Resp: (!) 4 19  Temp:    SpO2: 100% 100%   No intake/output data recorded.  Intake/Output Summary (Last 24 hours) at 08/10/2023 1250 Last data filed at 08/10/2023 0300 Gross per 24 hour  Intake 250 ml  Output --  Net 250 ml   General: Ill-appearing, lying in bed, no distress HEENT: anicteric sclera, oropharynx clear without lesions CV: Bradycardia, no rub Lungs: clear to auscultation bilaterally, normal work of breathing Abd: soft, non-tender, non-distended Skin: no visible lesions or rashes Psych: Awake and alert, appropriate mood and affect Musculoskeletal: no obvious deformities Neuro: normal speech, tired, able to answer some questions  Test Results Reviewed Lab Results  Component Value Date   NA 139 08/10/2023   K 3.0 (L) 08/10/2023   CL 100 08/10/2023   CO2 26 08/10/2023   BUN 36 (H) 08/10/2023   CREATININE 5.00 (H) 08/10/2023   CALCIUM 8.5 (L)  08/10/2023    ALBUMIN 1.9 (L) 08/08/2023   PHOS 2.7 11/11/2018    CBC Recent Labs  Lab 08/08/23 1823 08/09/23 0701 08/10/23 0900  WBC 14.2* 11.7* 12.7*  HGB 9.9* 9.0* 9.1*  HCT 30.4* 26.8* 28.1*  MCV 93.5 92.7 94.3  PLT 389 321 359    I have reviewed all relevant outside healthcare records related to the patient's current hospitalization

## 2023-08-10 NOTE — Care Management Important Message (Signed)
Important Message  Patient Details  Name: Dannial Drennen MRN: [id number] Date of Birth: December 05, 1950   Important Message Given:  Yes - Medicare IM     Sherilyn Banker 08/10/2023, 4:18 PM

## 2023-08-10 NOTE — Progress Notes (Addendum)
[facility]                                                                              Earl Calderon, is a 72 y.o. male, DOB - 12/20/1950, UEA:[id number] Admit date - 08/07/2023    Outpatient Primary MD for the patient is [name], [name], MD  LOS - 3  days  Chief Complaint  Patient presents with   Chest Pain       Brief summary   Patient is a 72 year old male with CAD, CKD stage III, DM type II, uncontrolled, MGUS, chronic systolic CHF, NSVT, resistant hypertension, hyperlipidemia presented to ED with shortness of breath.  Per admission history, patient has been complaining of progressive shortness of breath in the last 2 weeks, generalized anterior chest pain with efforts of breathing, no fevers, cough or leg swelling. He also reported chronic leg wounds and have been worsening for the last couple of weeks, more painful. In ED, patient was noted to be hypoxic on attempts at ambulation.    Assessment & Plan    Principal Problem:   Acute respiratory failure with hypoxia (HCC) -Patient had presented initially for shortness of breath and hypoxia -Unclear etiology, CTA chest showed no evidence of pulmonary embolus, aortic atherosclerosis, coronary artery calcifications.  No pleural effusion or pneumothorax. -2D echo still pending -O2 sats 100% on 2 L  Active Problems: Bilateral lower extremity wounds, gangrene, chronic limb ischemia -Foul-smelling black margin ulcers on examination - MRI right foot showed soft tissue ulceration along the medial aspect of first MTP joint, no osteomyelitis or abscess, nonspecific marrow edema within the second metatarsal head with questionable underlying linear subchondral low signal, could reflect [place] infarction or stress fracture -MRI left foot showed soft tissue ulceration along the plantar aspect of the hindfoot with underlying subcutaneous edema, soft tissue emphysema consistent with soft tissue infection,  possible necrotizing infection.  HSN mild calcaneal marrow T2 hyperintensity in the calcaneal tuberosity could represent hyperemia or early osteomyelitis -Hold vancomycin due to significant jump in creatinine today - vascular surgery following  Acute kidney injury on CKD stage IIIa -Baseline creatinine 1.2 -Presented with creatinine of 1.78, lactic acidosis -Creatinine trended up to 5.0 today (3.2 <-2.3 <-1.7) -Possibly due to contrast nephropathy from CTA, vancomycin, infection/bilateral lower extremity wounds, meds -Continue to hold Entresto, Aldactone, furosemide -Continue IV fluid hydration, obtain renal ultrasound, nephrology consulted -Renally dose all medications   Acute metabolic encephalopathy, likely has underlying dementia -May have underlying dementia.  CT head in 04/2022 had shown atrophy with chronic microvascular disease -UA showed> 500 glucose, small Hgb, otherwise negative for UTI -CT head showed no acute intracranial abnormality -B12 3571, folate 12.1, TSH 2.4, B1 pending     Chronic combined systolic and diastolic heart failure (HCC) -Follow 2D echo.  Previous echo 12/2022 had shown EF of 45 to 50% with G1 DD -BNP 200.5,     Type 2 diabetes mellitus with chronic kidney disease, with long-term current use of insulin (HCC) -Uncontrolled with hyperglycemia -Hemoglobin A1c 9.1 CBG (last 3)  Recent Labs    08/09/23 2040 08/09/23 2246 08/10/23 0739  GLUCAP 69* 101* 106*   -  Decrease SSI to sensitive while inpatient  Prolonged QTc -EKG on admission shows QTc of 522 -Avoid QT prolonging meds     MGUS (monoclonal gammopathy of unknown significance) -Outpatient follow with Dr. [name], last seen on 12/05/22  Estimated body mass index is 20.81 kg/m as calculated from the following:   Height as of this encounter: 5' 9.5" (1.765 m).   Weight as of this encounter: 64.9 kg.  Code Status: Full code DVT Prophylaxis:  enoxaparin (LOVENOX) injection 30 mg Start:  08/09/23 1000 SCDs Start: 08/07/23 2043   Level of Care: Level of care: Telemetry Medical Family Communication:  Disposition Plan:      Remains inpatient appropriate:      Procedures:    Consultants:   Vascular surgery Nephrology  Antimicrobials:   Anti-infectives (From admission, onward)    Start     Dose/Rate Route Frequency Ordered Stop   08/10/23 2200  vancomycin (VANCOCIN) IVPB 1000 mg/200 mL premix  Status:  Discontinued        1,000 mg 200 mL/hr over 60 Minutes Intravenous Every 48 hours 08/09/23 1047 08/10/23 1206   08/10/23 1206  vancomycin variable dose per unstable renal function (pharmacist dosing)         Does not apply See admin instructions 08/10/23 1206     08/10/23 0600  [MAR Hold]  cefUROXime (ZINACEF) 1.5 g in sodium chloride 0.9 % 100 mL IVPB        (MAR Hold since Mon 08/10/2023 at 1059.Hold Reason: Transfer to a Procedural area)   1.5 g 200 mL/hr over 30 Minutes Intravenous On call to O.R. 08/09/23 0846 08/11/23 0559   08/09/23 1400  [MAR Hold]  piperacillin-tazobactam (ZOSYN) IVPB 2.25 g        (MAR Hold since Mon 08/10/2023 at 1059.Hold Reason: Transfer to a Procedural area)   2.25 g 100 mL/hr over 30 Minutes Intravenous Every 8 hours 08/09/23 1050     08/08/23 2200  vancomycin (VANCOREADY) IVPB 750 mg/150 mL  Status:  Discontinued        750 mg 150 mL/hr over 60 Minutes Intravenous Every 24 hours 08/08/23 0015 08/09/23 1047   08/07/23 2330  Vancomycin (VANCOCIN) 1,500 mg in sodium chloride 0.9 % 500 mL IVPB        1,500 mg 250 mL/hr over 120 Minutes Intravenous  Once 08/07/23 2240 08/08/23 0740   08/07/23 2300  piperacillin-tazobactam (ZOSYN) IVPB 3.375 g  Status:  Discontinued        3.375 g 12.5 mL/hr over 240 Minutes Intravenous Every 8 hours 08/07/23 2240 08/09/23 1050   08/07/23 1715  cefTRIAXone (ROCEPHIN) 1 g in sodium chloride 0.9 % 100 mL IVPB        1 g 200 mL/hr over 30 Minutes Intravenous  Once 08/07/23 1707 08/07/23 1848           Medications  [MAR Hold] aspirin EC  81 mg Oral Daily   [MAR Hold] carvedilol  3.125 mg Oral BID WC   [MAR Hold] enoxaparin (LOVENOX) injection  30 mg Subcutaneous Q24H   [MAR Hold] insulin aspart  0-6 Units Subcutaneous Q4H   [MAR Hold] insulin detemir  8 Units Subcutaneous BID   [MAR Hold] rosuvastatin  40 mg Oral QHS   [MAR Hold] sodium chloride flush  3 mL Intravenous Q12H   vancomycin variable dose per unstable renal function (pharmacist dosing)   Does not apply See admin instructions      Subjective:   Earl Calderon was seen and examined  today.  No fevers overnight.  Foul-smelling ulcers on the lower legs.  Difficult to obtain ROS from the patient.  Overnight no acute issues, no pain, nausea vomiting, chest pain or shortness of breath.   Objective:   Vitals:   08/09/23 2031 08/10/23 0455 08/10/23 0736 08/10/23 1125  BP: 137/65 (!) 151/60 (!) 151/61   Pulse: (!) 52 60 (!) 59   Resp: 18 17 16    Temp: 97.9 F (36.6 C) 98.8 F (37.1 C) 99.9 F (37.7 C)   TempSrc: Oral Oral Oral   SpO2: 96% 97% 95% 100%  Weight:      Height:        Intake/Output Summary (Last 24 hours) at 08/10/2023 1214 Last data filed at 08/10/2023 0300 Gross per 24 hour  Intake 250 ml  Output --  Net 250 ml     Wt Readings from Last 3 Encounters:  08/07/23 64.9 kg  03/12/23 65 kg  12/29/22 66.1 kg   Physical Exam General: Alert, awake NAD Cardiovascular: S1 S2 clear, RRR.  Respiratory: CTAB Gastrointestinal: Soft, nontender, nondistended, NBS Ext: no pedal edema bilaterally Neuro: no new deficits Skin: Foul-smelling bilateral lower extremity ulcers Psych: flat affect           Data Reviewed:  I have personally reviewed following labs    CBC Lab Results  Component Value Date   WBC 12.7 (H) 08/10/2023   RBC 2.98 (L) 08/10/2023   HGB 9.1 (L) 08/10/2023   HCT 28.1 (L) 08/10/2023   MCV 94.3 08/10/2023   MCH 30.5 08/10/2023   PLT 359 08/10/2023   MCHC 32.4  08/10/2023   RDW 12.4 08/10/2023   LYMPHSABS 1.1 12/25/2022   MONOABS 0.4 12/25/2022   EOSABS 0.1 12/25/2022   BASOSABS 0.0 12/25/2022     Last metabolic panel Lab Results  Component Value Date   NA 139 08/10/2023   K 3.0 (L) 08/10/2023   CL 100 08/10/2023   CO2 26 08/10/2023   BUN 36 (H) 08/10/2023   CREATININE 5.00 (H) 08/10/2023   GLUCOSE 106 (H) 08/10/2023   GFRNONAA 12 (L) 08/10/2023   GFRAA 78 09/26/2020   CALCIUM 8.5 (L) 08/10/2023   PHOS 2.7 11/11/2018   PROT 6.6 08/08/2023   ALBUMIN 1.9 (L) 08/08/2023   LABGLOB 4.0 (H) 12/25/2022   AGRATIO 0.9 12/25/2022   BILITOT 0.9 08/08/2023   ALKPHOS 59 08/08/2023   AST 14 (L) 08/08/2023   ALT 10 08/08/2023   ANIONGAP 13 08/10/2023    CBG (last 3)  Recent Labs    08/09/23 2040 08/09/23 2246 08/10/23 0739  GLUCAP 69* 101* 106*      Coagulation Profile: Recent Labs  Lab 08/08/23 1823  INR 1.2     Radiology Studies: I have personally reviewed the imaging studies  CT HEAD WO CONTRAST ( )  Result Date: 08/08/2023 CLINICAL DATA:  Mental status change, unknown cause EXAM: CT HEAD WITHOUT CONTRAST TECHNIQUE: Contiguous axial images were obtained from the base of the skull through the vertex without intravenous contrast. RADIATION DOSE REDUCTION: This exam was performed according to the departmental dose-optimization program which includes automated exposure control, adjustment of the mA and/or kV according to patient size and/or use of iterative reconstruction technique. COMPARISON:  CT Head 04/22/22 FINDINGS: Brain: No evidence of acute infarction, hemorrhage, hydrocephalus, extra-axial collection or mass lesion/mass effect.Background of mild chronic microvascular ischemic change. 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 Vascular: No hyperdense vessel or unexpected calcification. Unchanged calcification along the left A2 segment.  Skull: Normal. Negative for fracture or focal lesion. Sinuses/Orbits: No acute finding.   Left lens replacement. Other: None. IMPRESSION: No acute intracranial abnormality. Electronically Signed   By: Lorenza Cambridge M.D.   On: 08/08/2023 17:33       Jessah Danser M.D. [facility] 08/10/2023, 12:14 PM  Available via Epic secure chat 7am-7pm After 7 pm, please refer to night coverage provider listed on amion.

## 2023-08-10 NOTE — Progress Notes (Signed)
Chaplain paged to code blue in Cath lab. Chaplain arrived and spoke to pt RN who shared that the code had been canceled and the pt was presently being observed in the cath lab until time to transfer to 4E.  Please page as further needs arise.  Earl Calderon. Carley Hammed, M.Div. [facility] Chaplain Pager [phone] Office [phone]

## 2023-08-10 NOTE — Progress Notes (Signed)
HR 38-41 consistently since arrival on unit.  Potassium 3.0 New orders received

## 2023-08-10 NOTE — Op Note (Addendum)
    Patient name: Earl Calderon MRN: [id number] DOB: 24-May-1951 Sex: male  08/10/2023 Pre-operative Diagnosis: CL TI with bilateral foot wounds Post-operative diagnosis:  Same Surgeon:  Daria Pastures, MD Procedure Performed:  Ultrasound-guided access of left common femoral artery Second order cannulation of right external iliac Aortogram Bilateral lower extremity angiogram Mynx closure of left common femoral artery access 5 minutes moderate sedation   Indications: Earl Calderon is a 72 year old male with multiple significant medical comorbidities who presented to the hospital with shortness of breath was found to have bilateral foot wounds.  Noninvasive vascular labs were notable for normal ABIs bilaterally but significantly decreased toe pressures in the 60s.  Recent benefits of bilateral lower extremity angiogram with possible intervention were reviewed with the patient and the wife, they expressed understanding were willing to proceed.  Given his renal function CO2 angiography was planned.  Findings: Widely patent infrarenal aorta and bilateral iliac systems.  Right common femoral, profunda and SFA patent without significant stenosis.  Popliteal artery patent without significant stenosis.  The AT is patent proximally but occludes at its mid segment and is reconstituted distally at the ankle by peroneal collaterals.  The PT and peroneal are patent without significant stenosis throughout the course.  Left common femoral, profunda and SFA are patent without significant stenosis popliteal artery patent without significant stenosis.  There is three-vessel runoff without significant stenosis noted in the AT, PT or peroneal arteries.   Procedure:  The patient was identified in the holding area and taken to the cath lab  The patient was then placed supine on the table and prepped and draped in the usual sterile fashion.  A time out was called.  Ultrasound was used to evaluate the left common  femoral artery.  It was patent .  A digital ultrasound image was acquired.  A micropuncture needle was used to access the left common femoral artery under ultrasound guidance.  An 018 wire was advanced without resistance and a micropuncture sheath was placed.  The 018 wire was removed and a benson wire was placed.  The micropuncture sheath was exchanged for a 5 french sheath.  An omniflush catheter was advanced over the wire to the level of L-1.   During this time patient began to desat to have respiratory distress.  The sedation was reversed with flumazenil, jaw thrust performed and a nonrebreather mask was placed.  During this time he did not have significant bradycardia down to 30 but recovered after a few minutes with a sat of 100 and a heart rate in the 70s which was his baseline.  At this time he was responding appropriately and denied any chest pain or shortness of breath.  I decided to proceed with angiography. An abdominal angiogram was obtained.  Next, using the omniflush catheter and a Bentson wire, the aortic bifurcation was crossed and the catheter was placed into theright external iliac artery and right runoff was obtained. This demonstrated the above findings. left runoff was performed via retrograde sheath injections which demonstrated the above findings.  Contrast: None, CO2 used Sedation: 5 minutes  Impression: Inline flow to the foot without flow-limiting stenosis bilaterally. Decreased toe pressures likely due to microvascular disease.  Remains high risk for amputation.   Daria Pastures MD [facility] Office: [phone]

## 2023-08-11 ENCOUNTER — Inpatient Hospital Stay (HOSPITAL_COMMUNITY): Payer: 59

## 2023-08-11 ENCOUNTER — Encounter (HOSPITAL_COMMUNITY): Payer: Self-pay | Admitting: Vascular Surgery

## 2023-08-11 DIAGNOSIS — I272 Pulmonary hypertension, unspecified: Secondary | ICD-10-CM

## 2023-08-11 DIAGNOSIS — R7989 Other specified abnormal findings of blood chemistry: Secondary | ICD-10-CM | POA: Diagnosis not present

## 2023-08-11 DIAGNOSIS — N179 Acute kidney failure, unspecified: Secondary | ICD-10-CM | POA: Diagnosis not present

## 2023-08-11 DIAGNOSIS — J9601 Acute respiratory failure with hypoxia: Secondary | ICD-10-CM | POA: Diagnosis not present

## 2023-08-11 DIAGNOSIS — L97511 Non-pressure chronic ulcer of other part of right foot limited to breakdown of skin: Secondary | ICD-10-CM | POA: Diagnosis not present

## 2023-08-11 LAB — POCT I-STAT, CHEM 8
BUN: 34 mg/dL — ABNORMAL HIGH (ref 8–23)
Calcium, Ion: 1.2 mmol/L (ref 1.15–1.40)
Chloride: 102 mmol/L (ref 98–111)
Creatinine, Ser: 5.3 mg/dL — ABNORMAL HIGH (ref 0.61–1.24)
Glucose, Bld: 124 mg/dL — ABNORMAL HIGH (ref 70–99)
HCT: 26 % — ABNORMAL LOW (ref 39.0–52.0)
Hemoglobin: 8.8 g/dL — ABNORMAL LOW (ref 13.0–17.0)
Potassium: 3 mmol/L — ABNORMAL LOW (ref 3.5–5.1)
Sodium: 141 mmol/L (ref 135–145)
TCO2: 30 mmol/L (ref 22–32)

## 2023-08-11 LAB — ECHOCARDIOGRAM COMPLETE
Area-P 1/2: 1.56 cm2
Height: 69.5 in
S' Lateral: 2.5 cm
Weight: 2288 [oz_av]

## 2023-08-11 LAB — BASIC METABOLIC PANEL
Anion gap: 12 (ref 5–15)
BUN: 44 mg/dL — ABNORMAL HIGH (ref 8–23)
CO2: 26 mmol/L (ref 22–32)
Calcium: 8.5 mg/dL — ABNORMAL LOW (ref 8.9–10.3)
Chloride: 102 mmol/L (ref 98–111)
Creatinine, Ser: 5.92 mg/dL — ABNORMAL HIGH (ref 0.61–1.24)
GFR, Estimated: 10 mL/min — ABNORMAL LOW (ref >60)
Glucose, Bld: 142 mg/dL — ABNORMAL HIGH (ref 70–99)
Potassium: 3.1 mmol/L — ABNORMAL LOW (ref 3.5–5.1)
Sodium: 140 mmol/L (ref 135–145)

## 2023-08-11 LAB — CBC
HCT: 31.4 % — ABNORMAL LOW (ref 39.0–52.0)
Hemoglobin: 10.3 g/dL — ABNORMAL LOW (ref 13.0–17.0)
MCH: 30.5 pg (ref 26.0–34.0)
MCHC: 32.8 g/dL (ref 30.0–36.0)
MCV: 92.9 fL (ref 80.0–100.0)
Platelets: 361 10*3/uL (ref 150–400)
RBC: 3.38 MIL/uL — ABNORMAL LOW (ref 4.22–5.81)
RDW: 12.4 % (ref 11.5–15.5)
WBC: 11.7 10*3/uL — ABNORMAL HIGH (ref 4.0–10.5)
nRBC: 0 % (ref 0.0–0.2)

## 2023-08-11 LAB — GLUCOSE, CAPILLARY
Glucose-Capillary: 124 mg/dL — ABNORMAL HIGH (ref 70–99)
Glucose-Capillary: 138 mg/dL — ABNORMAL HIGH (ref 70–99)
Glucose-Capillary: 162 mg/dL — ABNORMAL HIGH (ref 70–99)
Glucose-Capillary: 209 mg/dL — ABNORMAL HIGH (ref 70–99)
Glucose-Capillary: 220 mg/dL — ABNORMAL HIGH (ref 70–99)

## 2023-08-11 LAB — VITAMIN B1: Vitamin B1 (Thiamine): 98.9 nmol/L (ref 66.5–200.0)

## 2023-08-11 LAB — CK: Total CK: 53 U/L (ref 49–397)

## 2023-08-11 MED ORDER — SODIUM CHLORIDE 0.9 % IV SOLN
1.0000 g | Freq: Every day | INTRAVENOUS | Status: AC
  Administered 2023-08-11 – 2023-08-15 (×4): 1 g via INTRAVENOUS
  Filled 2023-08-11 (×5): qty 10

## 2023-08-11 MED ORDER — POTASSIUM CHLORIDE CRYS ER 20 MEQ PO TBCR
40.0000 meq | EXTENDED_RELEASE_TABLET | Freq: Once | ORAL | Status: AC
  Administered 2023-08-11: 40 meq via ORAL
  Filled 2023-08-11: qty 2

## 2023-08-11 NOTE — Progress Notes (Signed)
Echocardiogram 2D Echocardiogram has been performed.  Warren Lacy Jye Fariss RDCS 08/11/2023, 8:48 AM

## 2023-08-11 NOTE — Progress Notes (Signed)
Spoke with wife: Ekamjot Boniface, [phone]. She was concerned w/ communication w/ nursing. I updated her on pt status and his desire to try to ambulate to Bathroom. She Stated she did not yell at anyone. WE discussed importance of communicating and stressful situations with her worry for her husband. She stated she has not heard from any physicians.  I explained perhaps they were communicating/ updating her husband. I Explained our policy that recording conversations with any of the staff or physicians was not allowed and no one had given consent for her to do so.  She verbalized understanding. Emotional support/ listening provided. She thanked me for the call. ---JM

## 2023-08-11 NOTE — Progress Notes (Signed)
[facility]                                                                              Valerian Rill, is a 72 y.o. male, DOB - 11/30/1950, ZOX:[id number] Admit date - 08/07/2023    Outpatient Primary MD for the patient is [name], [name], MD  LOS - 4  days  Chief Complaint  Patient presents with   Chest Pain       Brief summary   Patient is a 72 year old male with CAD, CKD stage III, DM type II, uncontrolled, MGUS, chronic systolic CHF, NSVT, resistant hypertension, hyperlipidemia presented to ED with shortness of breath.  Per admission history, patient has been complaining of progressive shortness of breath in the last 2 weeks, generalized anterior chest pain with efforts of breathing, no fevers, cough or leg swelling. He also reported chronic leg wounds and have been worsening for the last couple of weeks, more painful. In ED, patient was noted to be hypoxic on attempts at ambulation.  08/10/2023: Underwent aortogram with bilateral lower extremity angiogram   Assessment & Plan    Principal Problem:   Acute respiratory failure with hypoxia (HCC) -Patient had presented initially for shortness of breath and hypoxia -Unclear etiology, CTA chest showed no evidence of pulmonary embolus, aortic atherosclerosis, coronary artery calcifications.  No pleural effusion or pneumothorax. -2D echo showed EF of 55 to 60%, no regional WMA, G2 DD -Stable, O2 sats 99% on room air  Active Problems: Bilateral lower extremity wounds, gangrene, chronic limb ischemia - Foul-smelling black margin ulcers on examination - MRI right foot showed soft tissue ulceration along the medial aspect of first MTP joint, no osteomyelitis or abscess, nonspecific marrow edema within the second metatarsal head with questionable underlying linear subchondral low signal, could reflect [place] infarction or stress fracture -MRI left foot showed soft tissue ulceration along the plantar aspect  of the hindfoot with underlying subcutaneous edema, soft tissue emphysema consistent with soft tissue infection, possible necrotizing infection.  HSN mild calcaneal marrow T2 hyperintensity in the calcaneal tuberosity could represent hyperemia or early osteomyelitis -Vascular surgery following, underwent aortogram with bilateral lower extremity angiogram.  Prior vascular surgery, inline flow to the foot without flow-limiting stenosis bilaterally, microvascular disease, remains high risk for amputation.  No intervention to offer from a vascular surgery perspective, recommended wound care and orthopedics consult for debridement versus amputation. -Continue IV Rocephin.  Vancomycin, Zosyn discontinued due to AKI -Orthopedics consulted  Acute kidney injury on CKD stage IIIa -Baseline creatinine 1.2,  presented with creatinine of 1.78, lactic acidosis -Creatinine trended up to 5.92 today (5.0< 3.2 <-2.3 <-1.7) -Possibly due to contrast nephropathy from CTA, vancomycin, infection/bilateral lower extremity wounds, meds -Entresto, Aldactone, furosemide held since admission. -Placed on IV fluid hydration, renal ultrasound showed medical renal disease, no hydronephrosis or stone, -Nephrology consulted  Acute metabolic encephalopathy, likely has underlying dementia -May have underlying dementia.  CT head in 04/2022 had shown atrophy with chronic microvascular disease -UA showed> 500 glucose, small Hgb, otherwise negative for UTI -CT head showed no acute intracranial abnormality -B12 3571, folate 12.1, TSH 2.4, B1 pending  Chronic combined systolic and diastolic heart failure (HCC) -previous echo 12/2022 had shown EF of 45 to 50% with G1 DD -BNP 200.5, -2D echo showed EF of 55 to 60%, no regional WMA, G2 DD     Type 2 diabetes mellitus with chronic kidney disease, with long-term current use of insulin (HCC) -Uncontrolled with hyperglycemia -Hemoglobin A1c 9.1 CBG (last 3)  Recent Labs     08/11/23 0024 08/11/23 0408 08/11/23 1153  GLUCAP 138* 124* 209*   -Continue sliding scale insulin, sensitive while inpatient  Prolonged QTc -EKG on admission shows QTc of 522 -Avoid QT prolonging meds     MGUS (monoclonal gammopathy of unknown significance) -Outpatient follow with Dr. [name], last seen on 12/05/22  Estimated body mass index is 20.81 kg/m as calculated from the following:   Height as of this encounter: 5' 9.5" (1.765 m).   Weight as of this encounter: 64.9 kg.  Code Status: Full code DVT Prophylaxis:  enoxaparin (LOVENOX) injection 30 mg Start: 08/09/23 1000 SCDs Start: 08/07/23 2043   Level of Care: Level of care: Telemetry Medical Family Communication:  Disposition Plan:      Remains inpatient appropriate:      Procedures:  08/10/2023 Ultrasound-guided access of left common femoral artery Second order cannulation of right external iliac Aortogram Bilateral lower extremity angiogram Mynx closure of left common femoral artery access 5 minutes moderate sedation   Consultants:   Vascular surgery Nephrology Orthopedics  Antimicrobials:   Anti-infectives (From admission, onward)    Start     Dose/Rate Route Frequency Ordered Stop   08/11/23 0800  cefTRIAXone (ROCEPHIN) 1 g in sodium chloride 0.9 % 100 mL IVPB        1 g 200 mL/hr over 30 Minutes Intravenous Daily 08/11/23 0559     08/10/23 2200  vancomycin (VANCOCIN) IVPB 1000 mg/200 mL premix  Status:  Discontinued        1,000 mg 200 mL/hr over 60 Minutes Intravenous Every 48 hours 08/09/23 1047 08/10/23 1206   08/10/23 1206  vancomycin variable dose per unstable renal function (pharmacist dosing)  Status:  Discontinued         Does not apply See admin instructions 08/10/23 1206 08/10/23 1218   08/10/23 0600  cefUROXime (ZINACEF) 1.5 g in sodium chloride 0.9 % 100 mL IVPB        1.5 g 200 mL/hr over 30 Minutes Intravenous On call to O.R. 08/09/23 0846 08/11/23 0559   08/09/23 1400   piperacillin-tazobactam (ZOSYN) IVPB 2.25 g  Status:  Discontinued        2.25 g 100 mL/hr over 30 Minutes Intravenous Every 8 hours 08/09/23 1050 08/11/23 0557   08/08/23 2200  vancomycin (VANCOREADY) IVPB 750 mg/150 mL  Status:  Discontinued        750 mg 150 mL/hr over 60 Minutes Intravenous Every 24 hours 08/08/23 0015 08/09/23 1047   08/07/23 2330  Vancomycin (VANCOCIN) 1,500 mg in sodium chloride 0.9 % 500 mL IVPB        1,500 mg 250 mL/hr over 120 Minutes Intravenous  Once 08/07/23 2240 08/08/23 0740   08/07/23 2300  piperacillin-tazobactam (ZOSYN) IVPB 3.375 g  Status:  Discontinued        3.375 g 12.5 mL/hr over 240 Minutes Intravenous Every 8 hours 08/07/23 2240 08/09/23 1050   08/07/23 1715  cefTRIAXone (ROCEPHIN) 1 g in sodium chloride 0.9 % 100 mL IVPB        1 g 200 mL/hr over 30 Minutes Intravenous  Once 08/07/23 1707 08/07/23 1848          Medications  aspirin EC  81 mg Oral Daily   enoxaparin (LOVENOX) injection  30 mg Subcutaneous Q24H   insulin aspart  0-6 Units Subcutaneous TID WC   insulin detemir  8 Units Subcutaneous BID   lidocaine  1 patch Transdermal Q24H   sodium chloride flush  3 mL Intravenous Q12H      Subjective:   Bo Storment was seen and examined today.  No acute complaints.  No pain, nausea vomiting, chest pain or shortness of breath.  No fevers overnight.  Foul-smelling ulcers on the lower legs.    Objective:   Vitals:   08/10/23 2256 08/11/23 0408 08/11/23 0740 08/11/23 0900  BP: (!) 129/56 (!) 141/55 (!) 91/39 (!) 116/51  Pulse:    (!) 52  Resp: 16 15  16   Temp: 98.6 F (37 C) 98.6 F (37 C) 98.1 F (36.7 C)   TempSrc: Oral Oral Oral   SpO2: 99% 99%    Weight:      Height:        Intake/Output Summary (Last 24 hours) at 08/11/2023 1304 Last data filed at 08/11/2023 0400 Gross per 24 hour  Intake 196.96 ml  Output --  Net 196.96 ml     Wt Readings from Last 3 Encounters:  08/07/23 64.9 kg  03/12/23 65 kg   12/29/22 66.1 kg   Physical Exam General: Alert and awake, NAD Cardiovascular: S1 S2 clear, RRR.  Respiratory: CTAB Gastrointestinal: Soft, nontender, nondistended, NBS Ext: no pedal edema bilaterally Neuro: no new deficits Skin: Foul-smelling lower extremity ulcers Psych: flat affect           Data Reviewed:  I have personally reviewed following labs    CBC Lab Results  Component Value Date   WBC 11.7 (H) 08/11/2023   RBC 3.38 (L) 08/11/2023   HGB 10.3 (L) 08/11/2023   HCT 31.4 (L) 08/11/2023   MCV 92.9 08/11/2023   MCH 30.5 08/11/2023   PLT 361 08/11/2023   MCHC 32.8 08/11/2023   RDW 12.4 08/11/2023   LYMPHSABS 1.1 12/25/2022   MONOABS 0.4 12/25/2022   EOSABS 0.1 12/25/2022   BASOSABS 0.0 12/25/2022     Last metabolic panel Lab Results  Component Value Date   NA 140 08/11/2023   K 3.1 (L) 08/11/2023   CL 102 08/11/2023   CO2 26 08/11/2023   BUN 44 (H) 08/11/2023   CREATININE 5.92 (H) 08/11/2023   GLUCOSE 142 (H) 08/11/2023   GFRNONAA 10 (L) 08/11/2023   GFRAA 78 09/26/2020   CALCIUM 8.5 (L) 08/11/2023   PHOS 2.7 11/11/2018   PROT 6.6 08/08/2023   ALBUMIN 1.9 (L) 08/08/2023   LABGLOB 4.0 (H) 12/25/2022   AGRATIO 0.9 12/25/2022   BILITOT 0.9 08/08/2023   ALKPHOS 59 08/08/2023   AST 14 (L) 08/08/2023   ALT 10 08/08/2023   ANIONGAP 12 08/11/2023    CBG (last 3)  Recent Labs    08/11/23 0024 08/11/23 0408 08/11/23 1153  GLUCAP 138* 124* 209*      Coagulation Profile: Recent Labs  Lab 08/08/23 1823  INR 1.2     Radiology Studies: I have personally reviewed the imaging studies  ECHOCARDIOGRAM COMPLETE  Result Date: 08/11/2023    ECHOCARDIOGRAM REPORT   Patient Name:   THADEN ABAJIAN Date of Exam: 08/11/2023 Medical Rec #:  [id number]         Height:  69.5 in Accession #:    [id number]        Weight:       143.0 lb Date of Birth:  1951/03/03         BSA:          1.800 m Patient Age:    72 years          BP:           141/55  mmHg Patient Gender: M                 HR:           53 bpm. Exam Location:  Inpatient Procedure: 2D Echo, 3D Echo, Color Doppler, Cardiac Doppler and Strain Analysis Indications:    I27.20 Pulmonary Hypertension  History:        Patient has prior history of Echocardiogram examinations, most                 recent 01/29/2023. CAD, Arrythmias:LBBB; Risk                 Factors:Hypertension, Diabetes and Dyslipidemia.  Sonographer:    Irving Burton Senior RDCS Referring Phys: [id number] [facility] GOEL IMPRESSIONS  1. Mildly decreased global longitudinal strain with some apical sparing, left ventricular mass calculated at 152.8g/m2. Left ventricular ejection fraction, by estimation, is 55 to 60%. Left ventricular ejection fraction by 3D volume is 55 %. The left ventricle has normal function. The left ventricle has no regional wall motion abnormalities. There is moderate concentric left ventricular hypertrophy. Left ventricular diastolic parameters are consistent with Grade II diastolic dysfunction (pseudonormalization). The average left ventricular global longitudinal strain is -13.9 %. The global longitudinal strain is abnormal.  2. Right ventricular systolic function is normal. The right ventricular size is normal. Moderately increased right ventricular wall thickness. There is normal pulmonary artery systolic pressure. The estimated right ventricular systolic pressure is 29.0 mmHg.  3. Left atrial size was severely dilated.  4. A small pericardial effusion is present. The pericardial effusion is circumferential.  5. The mitral valve is normal in structure. Trivial mitral valve regurgitation.  6. The aortic valve is tricuspid. There is mild thickening of the aortic valve. Aortic valve regurgitation is not visualized. Aortic valve sclerosis is present, with no evidence of aortic valve stenosis.  7. The inferior vena cava is normal in size with greater than 50% respiratory variability, suggesting right atrial pressure of 3 mmHg.  Comparison(s): The left ventricular function has improved. FINDINGS  Left Ventricle: Mildly decreased global longitudinal strain with some apical sparing, left ventricular mass calculated at 152.8g/m2. Left ventricular ejection fraction, by estimation, is 55 to 60%. Left ventricular ejection fraction by 3D volume is 55 %. The left ventricle has normal function. The left ventricle has no regional wall motion abnormalities. The average left ventricular global longitudinal strain is -13.9 %. The global longitudinal strain is abnormal. The left ventricular internal cavity size was normal in size. There is moderate concentric left ventricular hypertrophy. Abnormal (paradoxical) septal motion, consistent with left bundle branch block. Left ventricular diastolic parameters are consistent with Grade II diastolic dysfunction (pseudonormalization). Indeterminate filling pressures. Right Ventricle: The right ventricular size is normal. Moderately increased right ventricular wall thickness. Right ventricular systolic function is normal. There is normal pulmonary artery systolic pressure. The tricuspid regurgitant velocity is 2.55 m/s, and with an assumed right atrial pressure of 3 mmHg, the estimated right ventricular systolic pressure is 29.0 mmHg. Left Atrium: Left atrial size was severely dilated.  Right Atrium: Right atrial size was normal in size. Pericardium: A small pericardial effusion is present. The pericardial effusion is circumferential. Mitral Valve: The mitral valve is normal in structure. Mild mitral annular calcification. Trivial mitral valve regurgitation. Tricuspid Valve: The tricuspid valve is normal in structure. Tricuspid valve regurgitation is mild. Aortic Valve: The aortic valve is tricuspid. There is mild thickening of the aortic valve. Aortic valve regurgitation is not visualized. Aortic valve sclerosis is present, with no evidence of aortic valve stenosis. Pulmonic Valve: The pulmonic valve was normal  in structure. Pulmonic valve regurgitation is trivial. Aorta: The aortic root and ascending aorta are structurally normal, with no evidence of dilitation. Venous: The inferior vena cava is normal in size with greater than 50% respiratory variability, suggesting right atrial pressure of 3 mmHg. IAS/Shunts: No atrial level shunt detected by color flow Doppler.  LEFT VENTRICLE PLAX 2D LVIDd:         3.70 cm         Diastology LVIDs:         2.50 cm         LV e' medial:    4.46 cm/s LV PW:         1.45 cm         LV E/e' medial:  11.7 LV IVS:        1.60 cm         LV e' lateral:   6.64 cm/s LVOT diam:     2.00 cm         LV E/e' lateral: 7.8 LV SV:         78 LV SV Index:   43              2D LVOT Area:     3.14 cm        Longitudinal                                Strain                                2D Strain GLS  -14.2 %                                (A2C):                                2D Strain GLS  -15.0 %                                (A3C):                                2D Strain GLS  -12.4 %                                (A4C):                                2D Strain GLS  -13.9 %  Avg:                                 3D Volume EF                                LV 3D EF:    Left                                             ventricul                                             ar                                             ejection                                             fraction                                             by 3D                                             volume is                                             55 %.                                 3D Volume EF:                                3D EF:        55 %                                LV EDV:       162 ml                                LV ESV:       74 ml                                LV SV:        88 ml RIGHT VENTRICLE RV S prime:  11.40 cm/s TAPSE (M-mode): 1.8 cm LEFT ATRIUM             Index         RIGHT ATRIUM           Index LA diam:        4.30 cm 2.39 cm/m   RA Area:     18.20 cm LA Vol (A2C):   78.6 ml 43.66 ml/m  RA Volume:   51.70 ml  28.72 ml/m LA Vol (A4C):   83.4 ml 46.32 ml/m LA Biplane Vol: 81.3 ml 45.16 ml/m  AORTIC VALVE LVOT Vmax:   122.00 cm/s LVOT Vmean:  94.400 cm/s LVOT VTI:    0.247 m  AORTA Ao Root diam: 3.30 cm Ao Asc diam:  3.50 cm MITRAL VALVE               TRICUSPID VALVE MV Area (PHT): 1.56 cm    TR Peak grad:   26.0 mmHg MV Decel Time: 485 msec    TR Vmax:        255.00 cm/s MV E velocity: 52.10 cm/s MV A velocity: 75.10 cm/s  SHUNTS MV E/A ratio:  0.69        Systemic VTI:  0.25 m                            Systemic Diam: 2.00 cm Rachelle Hora Croitoru MD Electronically signed by Thurmon Fair MD Signature Date/Time: 08/11/2023/10:55:26 AM    Final    US RENAL  Result Date: 08/10/2023 CLINICAL DATA:  Acute renal insufficiency. EXAM: RENAL / URINARY TRACT ULTRASOUND COMPLETE COMPARISON:  Abdominal ultrasound dated 11/12/2018. FINDINGS: Right Kidney: Renal measurements: 11.3 x 6.8 x 5.4 cm = volume: 218 mL. Increased parenchymal echogenicity. No hydronephrosis or shadowing stone. Left Kidney: Renal measurements: 11.5 x 5.4 x 5.3 cm = volume: 172 mL. Increased parenchymal echogenicity. No hydronephrosis or shadowing stone. Bladder: The urinary bladder is collapsed and not well visualized. Other: None. IMPRESSION: Increased renal parenchymal echogenicity may represent medical renal disease. No hydronephrosis or shadowing stone. Electronically Signed   By: Elgie Collard M.D.   On: 08/10/2023 22:17   PERIPHERAL VASCULAR CATHETERIZATION  Result Date: 08/10/2023 Images from the original result were not included.   Patient name: Masaki Padley   MRN: [id number]        DOB: 01-24-1951          Sex: male  08/10/2023 Pre-operative Diagnosis: CL TI with bilateral foot wounds Post-operative diagnosis:  Same Surgeon:  Daria Pastures, MD Procedure Performed:  Ultrasound-guided  access of left common femoral artery Second order cannulation of right external iliac Aortogram Bilateral lower extremity angiogram Mynx closure of left common femoral artery access 5 minutes moderate sedation   Indications: Mr. Chidester is a 72 year old male with multiple significant medical comorbidities who presented to the hospital with shortness of breath was found to have bilateral foot wounds.  Noninvasive vascular labs were notable for normal ABIs bilaterally but significantly decreased toe pressures in the 60s.  Recent benefits of bilateral lower extremity angiogram with possible intervention were reviewed with the patient and the wife, they expressed understanding were willing to proceed.  Given his renal function CO2 angiography was planned.  Findings: Widely patent infrarenal aorta and bilateral iliac systems.  Right common femoral, profunda and SFA patent without significant stenosis.  Popliteal artery patent without significant stenosis.  The AT is patent proximally but occludes at its  mid segment and is reconstituted distally at the ankle by peroneal collaterals.  The PT and peroneal are patent without significant stenosis throughout the course.  Left common femoral, profunda and SFA are patent without significant stenosis popliteal artery patent without significant stenosis.  There is three-vessel runoff without significant stenosis noted in the AT, PT or peroneal arteries.             Procedure:  The patient was identified in the holding area and taken to the cath lab  The patient was then placed supine on the table and prepped and draped in the usual sterile fashion.  A time out was called.  Ultrasound was used to evaluate the left common femoral artery.  It was patent .  A digital ultrasound image was acquired.  A micropuncture needle was used to access the left common femoral artery under ultrasound guidance.  An 018 wire was advanced without resistance and a micropuncture sheath was placed.  The  018 wire was removed and a benson wire was placed.  The micropuncture sheath was exchanged for a 5 french sheath.  An omniflush catheter was advanced over the wire to the level of L-1.  During this time patient began to desat to have respiratory distress.  The sedation was reversed with flumazenil, jaw thrust performed and a nonrebreather mask was placed.  During this time he did not have significant bradycardia down to 30 but recovered after a few minutes with a sat of 100 and a heart rate in the 70s which was his baseline.  At this time he was responding appropriately and denied any chest pain or shortness of breath.  I decided to proceed with angiography. An abdominal angiogram was obtained.  Next, using the omniflush catheter and a Bentson wire, the aortic bifurcation was crossed and the catheter was placed into theright external iliac artery and right runoff was obtained. This demonstrated the above findings. left runoff was performed via retrograde sheath injections which demonstrated the above findings.  Contrast: None, CO2 used Sedation: 5 minutes  Impression: Inline flow to the foot without flow-limiting stenosis bilaterally. Decreased toe pressures likely due to microvascular disease.  Remains high risk for amputation.   Daria Pastures MD [name] Office: [phone]       Thad Ranger M.D. [facility] 08/11/2023, 1:04 PM  Available via Epic secure chat 7am-7pm After 7 pm, please refer to night coverage provider listed on amion.

## 2023-08-11 NOTE — Progress Notes (Addendum)
Received several phone calls from wife with loud tone of voice stating the patient O2 levels are low. She also states because of his skin color our equipment is not accurate. Upon assessment of patient o2 levels 90-97% on room air O2 was offered to patient for comfort, but was then denied by patient.

## 2023-08-11 NOTE — Progress Notes (Signed)
Nephrology Follow-Up Consult note   Assessment/Recommendations: Earl Calderon is a/an 72 y.o. male with a past medical history significant for CAD, CKD 3B, DM2, HFrEF (30-35% previously recently improved), HTN, HLD who present w/ hypoxia and foot wounds now c/b AKI    AKI on CKD 3a: BL around 1.3 but hard to say definitively. Unclear cause of AKI but contrast exposure contributing and acting like ATN possibly with some low BP on day of admission and possibly low BP at home. AIN or toxicity associated with vanc is also possible -continue supportive care; can continue IVFs -monitor UOP closely -Continue to monitor daily Cr, Dose meds for GFR -Monitor Daily I/Os, Daily weight  -Maintain MAP>65 for optimal renal perfusion.  -Avoid nephrotoxic medications including NSAIDs -Use synthetic opioids (Fentanyl/Dilaudid) if needed -Check Renal U/S to rule out obstruction -Currently no indication for HD but monitor closely   PAD/lower extremity wounds: s/p arteriogram with VVS 12/9. May require amputation. Abx and mgmt per primary; possibly ortho   Acute hypoxic respiratory failure: improved with supplemental O2.  Management per primary.   HFrEF: does not appear overloaded. Echo pending. Monitor volume status closely. Consider stopping fluids if SOB worsens or hypoxia develops   Hypertension: Seems controlled on current medications.  Continue to monitor   Uncontrolled Diabetes Mellitus Type 2 with Hyperglycemia: Management per primary   Hypokalemia: Replace orally as needed   Anemia: Likely multifactorial.  Transfuse as needed   AMS: seems clear today. Mostly depressed. May have some delirium   Recommendations conveyed to primary service.    [name] 08/11/2023 10:01 AM  ___________________________________________________________  CC: Lower extremity wounds  Interval History/Subjective: Patient states today that he is tired of being sick.  At  times feels like he gets short of breath.  Denies any other complaints.   Medications:  Current Facility-Administered Medications  Medication Dose Route Frequency Provider Last Rate Last Admin   0.9 %  sodium chloride infusion   Intravenous Continuous Rai, Ripudeep K, MD 125 mL/hr at 08/11/23 0612 Rate Change at 08/11/23 0612   acetaminophen (TYLENOL) tablet 650 mg  650 mg Oral Q6H PRN Nolberto Hanlon, MD   650 mg at 08/10/23 1330   Or   acetaminophen (TYLENOL) suppository 650 mg  650 mg Rectal Q6H PRN Nolberto Hanlon, MD       aspirin EC tablet 81 mg  81 mg Oral Daily Nolberto Hanlon, MD   81 mg at 08/10/23 0858   cefTRIAXone (ROCEPHIN) 1 g in sodium chloride 0.9 % 100 mL IVPB  1 g Intravenous Daily Rai, Ripudeep K, MD       enoxaparin (LOVENOX) injection 30 mg  30 mg Subcutaneous Q24H Rai, Ripudeep K, MD   30 mg at 08/09/23 1033   insulin aspart (novoLOG) injection 0-6 Units  0-6 Units Subcutaneous TID WC Rai, Ripudeep K, MD       insulin detemir (LEVEMIR) injection 8 Units  8 Units Subcutaneous BID Nolberto Hanlon, MD   8 Units at 08/10/23 0858   lidocaine (LIDODERM) 5 % 1 patch  1 patch Transdermal Q24H Daria Pastures, MD   1 patch at 08/10/23 1400   LORazepam (ATIVAN) tablet 1 mg  1 mg Oral Q8H PRN Rai, Ripudeep K, MD       Or   LORazepam (ATIVAN) injection 1 mg  1 mg Intramuscular Q8H PRN Rai, Ripudeep K, MD       polyethylene glycol (MIRALAX / GLYCOLAX) packet 17 g  17  g Oral Daily PRN Nolberto Hanlon, MD       potassium chloride SA (KLOR-CON M) CR tablet 40 mEq  40 mEq Oral Once Rai, Ripudeep K, MD       sodium chloride flush (NS) 0.9 % injection 3 mL  3 mL Intravenous Q12H Nolberto Hanlon, MD   3 mL at 08/10/23 2240      Review of Systems: 10 systems reviewed and negative except per interval history/subjective  Physical Exam: Vitals:   08/11/23 0740 08/11/23 0900  BP: (!) 91/39 (!) 116/51  Pulse:  (!) 52  Resp:  16  Temp: 98.1 F (36.7 C)   SpO2:     No intake/output data  recorded.  Intake/Output Summary (Last 24 hours) at 08/11/2023 1001 Last data filed at 08/11/2023 0400 Gross per 24 hour  Intake 196.96 ml  Output --  Net 196.96 ml   Constitutional: Tired appearing, intermittently tearful ENMT: ears and nose without scars or lesions, MMM CV: normal rate, no edema Respiratory: Bilateral chest rise, normal work of breathing Gastrointestinal: soft, non-tender, no palpable masses or hernias Skin: Chronic skin changes on the bilateral feet with wounds present, otherwise no rashes Psych: alert, judgement/insight appropriate, depressed mood and normal affect   Test Results I personally reviewed new and old clinical labs and radiology tests Lab Results  Component Value Date   NA 140 08/11/2023   K 3.1 (L) 08/11/2023   CL 102 08/11/2023   CO2 26 08/11/2023   BUN 44 (H) 08/11/2023   CREATININE 5.92 (H) 08/11/2023   CALCIUM 8.5 (L) 08/11/2023   ALBUMIN 1.9 (L) 08/08/2023   PHOS 2.7 11/11/2018    CBC Recent Labs  Lab 08/09/23 0701 08/10/23 0900 08/10/23 1148 08/11/23 0259  WBC 11.7* 12.7*  --  11.7*  HGB 9.0* 9.1* 8.8* 10.3*  HCT 26.8* 28.1* 26.0* 31.4*  MCV 92.7 94.3  --  92.9  PLT 321 359  --  361

## 2023-08-11 NOTE — Progress Notes (Signed)
[facility]  Subjective  - No new complaints   Objective (!) 141/55 (!) 41 98.6 F (37 C) (Oral) 15 99%  Intake/Output Summary (Last 24 hours) at 08/11/2023 0704 Last data filed at 08/11/2023 0400 Gross per 24 hour  Intake 196.96 ml  Output --  Net 196.96 ml    B groins without hematomas Chronic non healing wounds Lungs non labored breathing  Assessment/Planning: POD # 1 angiogram with runoff B LE  Findings: Widely patent infrarenal aorta and bilateral iliac systems.   Right common femoral, profunda and SFA patent without significant stenosis.  Popliteal artery patent without significant stenosis.  The AT is patent proximally but occludes at its mid segment and is reconstituted distally at the ankle by peroneal collaterals.  The PT and peroneal are patent without significant stenosis throughout the course.   Left common femoral, profunda and SFA are patent without significant stenosis popliteal artery patent without significant stenosis.  There is three-vessel runoff without significant stenosis noted in the AT, PT or peroneal arteries.  Impression: Inline flow to the foot without flow-limiting stenosis bilaterally. Decreased toe pressures likely due to microvascular disease.  Remains high risk for amputation.  There is no intervention to offer from a vascular surgery perspective.  Recommend continuing wound care and consider consult to orthopedics to discuss debridement versus possible amputation.   We will be available as needed.                Earl Calderon 08/11/2023 7:04 AM --  Laboratory Lab Results: Recent Labs    08/10/23 0900 08/11/23 0259  WBC 12.7* 11.7*  HGB 9.1* 10.3*  HCT 28.1* 31.4*  PLT 359 361   BMET Recent Labs    08/10/23 0900 08/11/23 0259  NA 139 140  K 3.0* 3.1*  CL 100 102  CO2 26 26  GLUCOSE 106* 142*  BUN 36* 44*  CREATININE 5.00* 5.92*  CALCIUM 8.5* 8.5*    COAG Lab Results   Component Value Date   INR 1.2 08/08/2023   INR 1.2 11/11/2018   No results found for: "PTT"

## 2023-08-11 NOTE — Progress Notes (Signed)
Mobility Specialist Progress Note:   08/11/23 1245  Mobility  Activity Ambulated with assistance to bathroom  Level of Assistance Contact guard assist, steadying assist  Assistive Device Front wheel walker  Distance Ambulated (ft) 15 ft  Activity Response Tolerated well  Mobility Referral Yes  Mobility visit 1 Mobility  Mobility Specialist Start Time (ACUTE ONLY) 1240  Mobility Specialist Stop Time (ACUTE ONLY) 1245  Mobility Specialist Time Calculation (min) (ACUTE ONLY) 5 min   Pt received in BR, requesting assistance back to bed. Pt needing extra time d/t slight SOB, otherwise asx throughout. VSS. Pursed lip breathing encouraged. Pt left in bed with call bell in reach and all needs met.   Leory Plowman  Mobility Specialist Please contact via [employer] office at [phone]

## 2023-08-12 DIAGNOSIS — E11621 Type 2 diabetes mellitus with foot ulcer: Secondary | ICD-10-CM

## 2023-08-12 DIAGNOSIS — L97511 Non-pressure chronic ulcer of other part of right foot limited to breakdown of skin: Secondary | ICD-10-CM | POA: Diagnosis not present

## 2023-08-12 DIAGNOSIS — L97509 Non-pressure chronic ulcer of other part of unspecified foot with unspecified severity: Secondary | ICD-10-CM | POA: Diagnosis not present

## 2023-08-12 DIAGNOSIS — L97521 Non-pressure chronic ulcer of other part of left foot limited to breakdown of skin: Secondary | ICD-10-CM

## 2023-08-12 DIAGNOSIS — J9601 Acute respiratory failure with hypoxia: Secondary | ICD-10-CM | POA: Diagnosis not present

## 2023-08-12 DIAGNOSIS — E1152 Type 2 diabetes mellitus with diabetic peripheral angiopathy with gangrene: Secondary | ICD-10-CM

## 2023-08-12 LAB — CBC
HCT: 24.1 % — ABNORMAL LOW (ref 39.0–52.0)
Hemoglobin: 7.9 g/dL — ABNORMAL LOW (ref 13.0–17.0)
MCH: 30.5 pg (ref 26.0–34.0)
MCHC: 32.8 g/dL (ref 30.0–36.0)
MCV: 93.1 fL (ref 80.0–100.0)
Platelets: 306 10*3/uL (ref 150–400)
RBC: 2.59 MIL/uL — ABNORMAL LOW (ref 4.22–5.81)
RDW: 12.8 % (ref 11.5–15.5)
WBC: 10.8 10*3/uL — ABNORMAL HIGH (ref 4.0–10.5)
nRBC: 0 % (ref 0.0–0.2)

## 2023-08-12 LAB — CULTURE, BLOOD (ROUTINE X 2)
Culture: NO GROWTH
Culture: NO GROWTH

## 2023-08-12 LAB — BASIC METABOLIC PANEL
Anion gap: 9 (ref 5–15)
BUN: 48 mg/dL — ABNORMAL HIGH (ref 8–23)
CO2: 23 mmol/L (ref 22–32)
Calcium: 7.4 mg/dL — ABNORMAL LOW (ref 8.9–10.3)
Chloride: 107 mmol/L (ref 98–111)
Creatinine, Ser: 6.29 mg/dL — ABNORMAL HIGH (ref 0.61–1.24)
GFR, Estimated: 9 mL/min — ABNORMAL LOW (ref >60)
Glucose, Bld: 114 mg/dL — ABNORMAL HIGH (ref 70–99)
Potassium: 3.4 mmol/L — ABNORMAL LOW (ref 3.5–5.1)
Sodium: 139 mmol/L (ref 135–145)

## 2023-08-12 LAB — GLUCOSE, CAPILLARY
Glucose-Capillary: 117 mg/dL — ABNORMAL HIGH (ref 70–99)
Glucose-Capillary: 149 mg/dL — ABNORMAL HIGH (ref 70–99)
Glucose-Capillary: 189 mg/dL — ABNORMAL HIGH (ref 70–99)
Glucose-Capillary: 153 mg/dL — ABNORMAL HIGH (ref 70–99)

## 2023-08-12 MED ORDER — POTASSIUM CHLORIDE CRYS ER 20 MEQ PO TBCR
40.0000 meq | EXTENDED_RELEASE_TABLET | Freq: Once | ORAL | Status: AC
  Administered 2023-08-12: 40 meq via ORAL
  Filled 2023-08-12: qty 2

## 2023-08-12 MED ORDER — ONDANSETRON HCL 4 MG/2ML IJ SOLN
4.0000 mg | Freq: Four times a day (QID) | INTRAMUSCULAR | Status: DC | PRN
  Administered 2023-08-12 – 2023-08-25 (×2): 4 mg via INTRAVENOUS
  Filled 2023-08-12 (×2): qty 2

## 2023-08-12 NOTE — Progress Notes (Signed)
Nephrology Follow-Up Consult note   Assessment/Recommendations: Earl Calderon is a/an 72 y.o. male with a past medical history significant for CAD, CKD 3B, DM2, HFrEF (30-35% previously recently improved), HTN, HLD who present w/ hypoxia and foot wounds now c/b AKI    AKI on CKD 3a: BL around 1.3 but hard to say definitively. Unclear cause of AKI but contrast exposure contributing and acting like ATN possibly with some low BP on day of admission and possibly low BP at home. AIN or toxicity associated with vanc is also possible.  No signs of uremia at this time -continue supportive care; can continue IVFs -monitor UOP closely -Continue to monitor daily Cr, Dose meds for GFR -Monitor Daily I/Os, Daily weight  -Maintain MAP>65 for optimal renal perfusion.  -Avoid nephrotoxic medications including NSAIDs -Use synthetic opioids (Fentanyl/Dilaudid) if needed -Check Renal U/S to rule out obstruction -Currently no indication for HD but monitor closely   PAD/lower extremity wounds: s/p arteriogram with VVS 12/9.  Plan for bilateral BKA likely on Friday   Acute hypoxic respiratory failure: improved with supplemental O2.  Management per primary.   HFrEF: does not appear overloaded. Echo with normal ejection fraction.  Stop IV fluids   Hypertension: Seems controlled on current medications.  Continue to monitor   Uncontrolled Diabetes Mellitus Type 2 with Hyperglycemia: Management per primary   Hypokalemia: Replace orally as needed   Anemia: Likely multifactorial.  Avoiding iron given lower extremity wounds.  Consider ESA.  Transfuse as needed   AMS: No signs of altered mental status on my exam.   Recommendations conveyed to primary service.    Darnell Level [facility] 08/12/2023 11:12 AM  ___________________________________________________________  CC: Lower extremity wounds  Interval History/Subjective: Patient states he is worried about plans for surgery on  his legs.  Current plan is bilateral lower extremity amputation with orthopedics.  Otherwise no complaints.   Medications:  Current Facility-Administered Medications  Medication Dose Route Frequency Provider Last Rate Last Admin   acetaminophen (TYLENOL) tablet 650 mg  650 mg Oral Q6H PRN Nolberto Hanlon, MD   650 mg at 08/12/23 3295   Or   acetaminophen (TYLENOL) suppository 650 mg  650 mg Rectal Q6H PRN Nolberto Hanlon, MD       aspirin EC tablet 81 mg  81 mg Oral Daily Nolberto Hanlon, MD   81 mg at 08/12/23 0910   cefTRIAXone (ROCEPHIN) 1 g in sodium chloride 0.9 % 100 mL IVPB  1 g Intravenous Daily Rai, Ripudeep K, MD 200 mL/hr at 08/12/23 0913 1 g at 08/12/23 0913   enoxaparin (LOVENOX) injection 30 mg  30 mg Subcutaneous Q24H Rai, Ripudeep K, MD   30 mg at 08/12/23 0911   insulin aspart (novoLOG) injection 0-6 Units  0-6 Units Subcutaneous TID WC Rai, Ripudeep K, MD   2 Units at 08/11/23 1730   insulin detemir (LEVEMIR) injection 8 Units  8 Units Subcutaneous BID Nolberto Hanlon, MD   8 Units at 08/12/23 0911   lidocaine (LIDODERM) 5 % 1 patch  1 patch Transdermal Q24H Daria Pastures, MD   1 patch at 08/10/23 1400   LORazepam (ATIVAN) tablet 1 mg  1 mg Oral Q8H PRN Rai, Ripudeep K, MD       Or   LORazepam (ATIVAN) injection 1 mg  1 mg Intramuscular Q8H PRN Rai, Ripudeep K, MD       polyethylene glycol (MIRALAX / GLYCOLAX) packet 17 g  17 g Oral Daily PRN Nolberto Hanlon,  MD       sodium chloride flush (NS) 0.9 % injection 3 mL  3 mL Intravenous Q12H Nolberto Hanlon, MD   3 mL at 08/11/23 2138      Review of Systems: 10 systems reviewed and negative except per interval history/subjective  Physical Exam: Vitals:   08/12/23 0757 08/12/23 0812  BP: (!) 135/59   Pulse: (!) 51 (!) 54  Resp: 19 19  Temp: 97.6 F (36.4 C)   SpO2: 100% 100%   Total I/O In: 240 [P.O.:240] Out: 250 [Urine:250]  Intake/Output Summary (Last 24 hours) at 08/12/2023 1112 Last data filed at 08/12/2023 1000 Gross per 24  hour  Intake 540 ml  Output 450 ml  Net 90 ml   Constitutional: Lying in bed, no distress ENMT: ears and nose without scars or lesions, MMM CV: normal rate, no edema Respiratory: Bilateral chest rise, normal work of breathing Gastrointestinal: soft, non-tender, no palpable masses or hernias Skin: Chronic skin changes on the bilateral feet with wounds present, otherwise no rashes Psych: alert, judgement/insight appropriate, depressed mood and normal affect   Test Results I personally reviewed new and old clinical labs and radiology tests Lab Results  Component Value Date   NA 139 08/12/2023   K 3.4 (L) 08/12/2023   CL 107 08/12/2023   CO2 23 08/12/2023   BUN 48 (H) 08/12/2023   CREATININE 6.29 (H) 08/12/2023   CALCIUM 7.4 (L) 08/12/2023   ALBUMIN 1.9 (L) 08/08/2023   PHOS 2.7 11/11/2018    CBC Recent Labs  Lab 08/10/23 0900 08/10/23 1148 08/11/23 0259 08/12/23 0334  WBC 12.7*  --  11.7* 10.8*  HGB 9.1* 8.8* 10.3* 7.9*  HCT 28.1* 26.0* 31.4* 24.1*  MCV 94.3  --  92.9 93.1  PLT 359  --  361 306

## 2023-08-12 NOTE — H&P (View-Only) (Signed)
 ORTHOPAEDIC CONSULTATION  REQUESTING PHYSICIAN: Joseph Art, DO  Chief Complaint:  Gangrenous ulcers both feet  HPI: Earl Calderon is a 72 y.o. male who presents with gangrene both feet.  Patient states this has been going on for several weeks.  Past Medical History:  Diagnosis Date   CAD in native artery 01/01/2021   Prior LAD PCI.  Currently no symptoms of ischemia.  Encouraged him to do some light exercising.  Continue aspirin, carvedilol, and rosuvastatin.   Cataract    CKD (chronic kidney disease), stage III (HCC) 08/24/2020   Coronary artery disease    Diabetes mellitus without complication (HCC)    on meds   Diabetic retinopathy (HCC)    Goals of care, counseling/discussion 07/01/2021   Hernia, inguinal    Hypertension    on meds   Hypertensive retinopathy    Lymphadenopathy, cervical 07/01/2021   MGUS (monoclonal gammopathy of unknown significance) 07/01/2021   NSVT (nonsustained ventricular tachycardia) (HCC) 08/24/2020   Pure hypercholesterolemia 08/05/2021   Resistant hypertension 11/11/2018   Uncontrolled type 2 diabetes mellitus 11/11/2018   Past Surgical History:  Procedure Laterality Date   ABDOMINAL AORTOGRAM W/LOWER EXTREMITY N/A 08/10/2023   Procedure: ABDOMINAL AORTOGRAM W/LOWER EXTREMITY;  Surgeon: Daria Pastures, MD;  Location: [facility] IN[facility]ASIVE [facility];  Service: Cardiovascular;  Laterality: N/A;   CORONARY STENT INTERVENTION N/A 11/15/2018   Procedure: CORONARY STENT INTERVENTION;  Surgeon: Corky Crafts, MD;  Location: [facility];  Service: Cardiovascular;  Laterality: N/A;   HEMORROIDECTOMY     RIGHT HEART CATH N/A 07/18/2020   Procedure: RIGHT HEART CATH;  Surgeon: Iran Ouch, MD;  Location: [facility];  Service: Cardiovascular;  Laterality: N/A;   RIGHT/LEFT HEART CATH AND CORONARY ANGIOGRAPHY N/A 11/15/2018   Procedure: RIGHT/LEFT HEART CATH AND CORONARY ANGIOGRAPHY;  Surgeon: Corky Crafts, MD;   [facility] INVAS[facility] CV LAB;  Service: Cardiovascular;  Laterality: N/A;   WISDOM TOOTH EXTRACTION     Social History   Socioeconomic History   Marital status: Married    Spouse name: Not on file   Number of children: Not on file   Years of education: Not on file   Highest education level: Not on file  Occupational History   Not on file  Tobacco Use   Smoking status: Never   Smokeless tobacco: Never  Vaping Use   Vaping status: Never Used  Substance and Sexual Activity   Alcohol use: Not Currently   Drug use: Never   Sexual activity: Not Currently  Other Topics Concern   Not on file  Social History Narrative   Not on file   Social Determinants of Health   Financial Resource Strain: Medium Risk (09/21/2020)   Overall Financial Resource Strain (CARDIA)    Difficulty of Paying Living Expenses: Somewhat hard  Food Insecurity: No Food Insecurity (08/07/2023)   Hunger Vital Sign    Worried About Running Out of Food in the Last Year: Never true    Ran Out of Food in the Last Year: Never true  Transportation Needs: No Transportation Needs (08/07/2023)   PRAPARE - Administrator, Civil Service (Medical): No    Lack of Transportation (Non-Medical): No  Physical Activity: Not on file  Stress: Not on file  Social Connections: Not on file   Family History  Problem Relation Age of Onset   Hypertension Mother    Diabetes Mellitus II Mother    Arrhythmia Mother  Heart disease Mother    Hypertension Sister    Hypertension Brother    Heart disease Brother    Kidney disease Brother    Hypertension Other    Diabetes Mellitus II Other        All 9 sibblings have HTN   Colon cancer Neg Hx    Colon polyps Neg Hx    Esophageal cancer Neg Hx    Stomach cancer Neg Hx    Rectal cancer Neg Hx    - negative except otherwise stated in the family history section No Known Allergies Prior to Admission medications   Medication Sig Start Date End Date Taking? Authorizing  Provider  APPLE CIDER VINEGAR PO Take 1 tablet by mouth daily at 6 (six) AM.   Yes [provider]  aspirin EC (ASPIRIN LOW DOSE) 81 MG tablet Take 1 tablet (81 mg total) by mouth daily. Swallow whole. 06/26/23  Yes Chilton Si, MD  carvedilol (COREG) 6.25 MG tablet Take 1 tablet (6.25 mg total) by mouth 2 (two) times daily with a meal. 12/29/22  Yes Chilton Si, MD  ELDERBERRY PO Take 100 mg by mouth daily.   Yes [provider]  Ergocalciferol (VITAMIN D2 PO) Take 2 tablets by mouth daily.   Yes [provider]  ferrous sulfate 325 (65 FE) MG tablet Take 1 tablet (325 mg total) by mouth daily with breakfast. 11/11/21  Yes Chilton Si, MD  furosemide (LASIX) 40 MG tablet TAKE 1 TABLET BY MOUTH ONCE DAILY 10/10/21  Yes Alver Sorrow, NP  HUMALOG KWIKPEN 100 UNIT/ML KwikPen Inject into the skin. Sliding Scale 06/13/22  Yes [provider]  hydrALAZINE (APRESOLINE) 25 MG tablet TAKE 1 TABLET BY MOUTH IN THE MORNING, AND TAKE 1 TABLET AT BEDTIME Patient taking differently: Take 25 mg by mouth at bedtime. 05/18/23  Yes Chilton Si, MD  JARDIANCE 10 MG TABS tablet TAKE ONE TABLET BY MOUTH DAILY BEFORE BREAKFAST 05/12/22  Yes Chilton Si, MD  LEVEMIR FLEXTOUCH 100 UNIT/ML FlexPen Inject 8 Units into the skin 2 (two) times daily. Patient taking differently: Inject 8 Units into the skin in the morning, at noon, and at bedtime. 01/02/21  Yes Hoy Register, MD  nitroGLYCERIN (NITROSTAT) 0.4 MG SL tablet Place 1 tablet (0.4 mg total) under the tongue every 5 (five) minutes as needed for chest pain. 10/22/20 08/08/23 Yes Duke, Roe Rutherford, PA  Omega-3 Fatty Acids (FISH OIL PO) Take 1 tablet by mouth daily.   Yes [provider]  Probiotic Product (PROBIOTIC MULTI-ENZYME) TABS Take 3 tablets by mouth daily at 6 (six) AM.   Yes [provider]  rosuvastatin (CRESTOR) 40 MG tablet TAKE 1 TABLET BY MOUTH ONCE DAILY AT [facility] 04/30/23  Yes  Chilton Si, MD  sacubitril-valsartan (ENTRESTO) 97-103 MG TAKE ONE TABLET BY MOUTH TWO TIMES DAILY 05/28/23  Yes Chilton Si, MD  spironolactone (ALDACTONE) 25 MG tablet TAKE 1 TABLET BY MOUTH ONCE DAILY 08/06/23  Yes Chilton Si, MD  Turmeric 500 MG TABS Take 2 tablets by mouth daily at 6 (six) AM.   Yes [provider]  glucose blood (FREESTYLE TEST STRIPS) test strip Use as instructed 10/21/20   Grayce Sessions, NP  Insulin Pen Needle (NOVOFINE PEN NEEDLE) 32G X 6 MM MISC USE AS DIRECTED 01/11/21   Claiborne Rigg, NP   ECHOCARDIOGRAM COMPLETE  Result Date: 08/11/2023    ECHOCARDIOGRAM REPORT   Patient Name:   JAVIONE MATHERS Date of Exam: 08/11/2023 Medical Rec #:  [id number]         Height:       69.5 in Accession #:    [id number]        Weight:       143.0 lb Date of Birth:  Feb 14, 1951         BSA:          1.800 m Patient Age:    72 years          BP:           141/55 mmHg Patient Gender: M                 HR:           53 bpm. Exam Location:  Inpatient Procedure: 2D Echo, 3D Echo, Color Doppler, Cardiac Doppler and Strain Analysis Indications:    I27.20 Pulmonary Hypertension  History:        Patient has prior history of Echocardiogram examinations, most                 recent 01/29/2023. CAD, Arrythmias:LBBB; Risk                 Factors:Hypertension, Diabetes and Dyslipidemia.  Sonographer:    Irving Burton Senior RDCS Referring Phys: [id number] [facility] GOEL IMPRESSIONS  1. Mildly decreased global longitudinal strain with some apical sparing, left ventricular mass calculated at 152.8g/m2. Left ventricular ejection fraction, by estimation, is 55 to 60%. Left ventricular ejection fraction by 3D volume is 55 %. The left ventricle has normal function. The left ventricle has no regional wall motion abnormalities. There is moderate concentric left ventricular hypertrophy. Left ventricular diastolic parameters are consistent with Grade II diastolic dysfunction (pseudonormalization). The  average left ventricular global longitudinal strain is -13.9 %. The global longitudinal strain is abnormal.  2. Right ventricular systolic function is normal. The right ventricular size is normal. Moderately increased right ventricular wall thickness. There is normal pulmonary artery systolic pressure. The estimated right ventricular systolic pressure is 29.0 mmHg.  3. Left atrial size was severely dilated.  4. A small pericardial effusion is present. The pericardial effusion is circumferential.  5. The mitral valve is normal in structure. Trivial mitral valve regurgitation.  6. The aortic valve is tricuspid. There is mild thickening of the aortic valve. Aortic valve regurgitation is not visualized. Aortic valve sclerosis is present, with no evidence of aortic valve stenosis.  7. The inferior vena cava is normal in size with greater than 50% respiratory variability, suggesting right atrial pressure of 3 mmHg. Comparison(s): The left ventricular function has improved. FINDINGS  Left Ventricle: Mildly decreased global longitudinal strain with some apical sparing, left ventricular mass calculated at 152.8g/m2. Left ventricular ejection fraction, by estimation, is 55 to 60%. Left ventricular ejection fraction by 3D volume is 55 %. The left ventricle has normal function. The left ventricle has no regional wall motion abnormalities. The average left ventricular global longitudinal strain is -13.9 %. The global longitudinal strain is abnormal. The left ventricular internal cavity size was normal in size. There is moderate concentric left ventricular hypertrophy. Abnormal (paradoxical) septal motion, consistent with left bundle branch block. Left ventricular diastolic parameters are consistent with Grade II diastolic dysfunction (pseudonormalization). Indeterminate filling pressures. Right Ventricle: The right ventricular size is normal. Moderately increased right ventricular wall thickness. Right ventricular systolic  function is normal. There is normal pulmonary artery systolic pressure. The tricuspid regurgitant velocity is 2.55 m/s, and with an assumed right atrial pressure of 3 mmHg, the  estimated right ventricular systolic pressure is 29.0 mmHg. Left Atrium: Left atrial size was severely dilated. Right Atrium: Right atrial size was normal in size. Pericardium: A small pericardial effusion is present. The pericardial effusion is circumferential. Mitral Valve: The mitral valve is normal in structure. Mild mitral annular calcification. Trivial mitral valve regurgitation. Tricuspid Valve: The tricuspid valve is normal in structure. Tricuspid valve regurgitation is mild. Aortic Valve: The aortic valve is tricuspid. There is mild thickening of the aortic valve. Aortic valve regurgitation is not visualized. Aortic valve sclerosis is present, with no evidence of aortic valve stenosis. Pulmonic Valve: The pulmonic valve was normal in structure. Pulmonic valve regurgitation is trivial. Aorta: The aortic root and ascending aorta are structurally normal, with no evidence of dilitation. Venous: The inferior vena cava is normal in size with greater than 50% respiratory variability, suggesting right atrial pressure of 3 mmHg. IAS/Shunts: No atrial level shunt detected by color flow Doppler.  LEFT VENTRICLE PLAX 2D LVIDd:         3.70 cm         Diastology LVIDs:         2.50 cm         LV e' medial:    4.46 cm/s LV PW:         1.45 cm         LV E/e' medial:  11.7 LV IVS:        1.60 cm         LV e' lateral:   6.64 cm/s LVOT diam:     2.00 cm         LV E/e' lateral: 7.8 LV SV:         78 LV SV Index:   43              2D LVOT Area:     3.14 cm        Longitudinal                                Strain                                2D Strain GLS  -14.2 %                                (A2C):                                2D Strain GLS  -15.0 %                                (A3C):                                2D Strain GLS  -12.4 %                                 (A4C):  2D Strain GLS  -13.9 %                                Avg:                                 3D Volume EF                                LV 3D EF:    Left                                             ventricul                                             ar                                             ejection                                             fraction                                             by 3D                                             volume is                                             55 %.                                 3D Volume EF:                                3D EF:        55 %                                LV EDV:       162 ml                                LV ESV:       74 ml  LV SV:        88 ml RIGHT VENTRICLE RV S prime:     11.40 cm/s TAPSE (M-mode): 1.8 cm LEFT ATRIUM             Index        RIGHT ATRIUM           Index LA diam:        4.30 cm 2.39 cm/m   RA Area:     18.20 cm LA Vol (A2C):   78.6 ml 43.66 ml/m  RA Volume:   51.70 ml  28.72 ml/m LA Vol (A4C):   83.4 ml 46.32 ml/m LA Biplane Vol: 81.3 ml 45.16 ml/m  AORTIC VALVE LVOT Vmax:   122.00 cm/s LVOT Vmean:  94.400 cm/s LVOT VTI:    0.247 m  AORTA Ao Root diam: 3.30 cm Ao Asc diam:  3.50 cm MITRAL VALVE               TRICUSPID VALVE MV Area (PHT): 1.56 cm    TR Peak grad:   26.0 mmHg MV Decel Time: 485 msec    TR Vmax:        255.00 cm/s MV E velocity: 52.10 cm/s MV A velocity: 75.10 cm/s  SHUNTS MV E/A ratio:  0.69        Systemic VTI:  0.25 m                            Systemic Diam: 2.00 cm Rachelle Hora Croitoru MD Electronically signed by Thurmon Fair MD Signature Date/Time: 08/11/2023/10:55:26 AM    Final    US RENAL  Result Date: 08/10/2023 CLINICAL DATA:  Acute renal insufficiency. EXAM: RENAL / URINARY TRACT ULTRASOUND COMPLETE COMPARISON:  Abdominal ultrasound dated 11/12/2018. FINDINGS: Right Kidney: Renal  measurements: 11.3 x 6.8 x 5.4 cm = volume: 218 mL. Increased parenchymal echogenicity. No hydronephrosis or shadowing stone. Left Kidney: Renal measurements: 11.5 x 5.4 x 5.3 cm = volume: 172 mL. Increased parenchymal echogenicity. No hydronephrosis or shadowing stone. Bladder: The urinary bladder is collapsed and not well visualized. Other: None. IMPRESSION: Increased renal parenchymal echogenicity may represent medical renal disease. No hydronephrosis or shadowing stone. Electronically Signed   By: Elgie Collard M.D.   On: 08/10/2023 22:17   PERIPHERAL VASCULAR CATHETERIZATION  Result Date: 08/10/2023 Images from the original result were not included.   Patient name: Earl Calderon   MRN: [id number]        DOB: [birth year]          Sex: male  08/10/2023 Pre-operative Diagnosis: CL TI with bilateral foot wounds Post-operative diagnosis:  Same Surgeon:  Daria Pastures, MD Procedure Performed:  Ultrasound-guided access of left common femoral artery Second order cannulation of right external iliac Aortogram Bilateral lower extremity angiogram Mynx closure of left common femoral artery access 5 minutes moderate sedation   Indications: Mr. Roland is a 71 year old male with multiple significant medical comorbidities who presented to the hospital with shortness of breath was found to have bilateral foot wounds.  Noninvasive vascular labs were notable for normal ABIs bilaterally but significantly decreased toe pressures in the 60s.  Recent benefits of bilateral lower extremity angiogram with possible intervention were reviewed with the patient and the wife, they expressed understanding were willing to proceed.  Given his renal function CO2 angiography was planned.  Findings: Widely patent infrarenal aorta and bilateral iliac systems.  Right common femoral, profunda and SFA patent without  significant stenosis.  Popliteal artery patent without significant stenosis.  The AT is patent proximally but occludes at its  mid segment and is reconstituted distally at the ankle by peroneal collaterals.  The PT and peroneal are patent without significant stenosis throughout the course.  Left common femoral, profunda and SFA are patent without significant stenosis popliteal artery patent without significant stenosis.  There is three-vessel runoff without significant stenosis noted in the AT, PT or peroneal arteries.             Procedure:  The patient was identified in the holding area and taken to the cath lab  The patient was then placed supine on the table and prepped and draped in the usual sterile fashion.  A time out was called.  Ultrasound was used to evaluate the left common femoral artery.  It was patent .  A digital ultrasound image was acquired.  A micropuncture needle was used to access the left common femoral artery under ultrasound guidance.  An 018 wire was advanced without resistance and a micropuncture sheath was placed.  The 018 wire was removed and a benson wire was placed.  The micropuncture sheath was exchanged for a 5 french sheath.  An omniflush catheter was advanced over the wire to the level of L-1.  During this time patient began to desat to have respiratory distress.  The sedation was reversed with flumazenil, jaw thrust performed and a nonrebreather mask was placed.  During this time he did not have significant bradycardia down to 30 but recovered after a few minutes with a sat of 100 and a heart rate in the 70s which was his baseline.  At this time he was responding appropriately and denied any chest pain or shortness of breath.  I decided to proceed with angiography. An abdominal angiogram was obtained.  Next, using the omniflush catheter and a Bentson wire, the aortic bifurcation was crossed and the catheter was placed into theright external iliac artery and right runoff was obtained. This demonstrated the above findings. left runoff was performed via retrograde sheath injections which demonstrated the  above findings.  Contrast: None, CO2 used Sedation: 5 minutes  Impression: Inline flow to the foot without flow-limiting stenosis bilaterally. Decreased toe pressures likely due to microvascular disease.  Remains high risk for amputation.   Daria Pastures MD [facility] Office: [phone]   - pertinent xrays, CT, MRI studies were reviewed and independently interpreted  Positive ROS: All other systems have been reviewed and were otherwise negative with the exception of those mentioned in the HPI and as above.  Physical Exam: General: Alert, no acute distress Psychiatric: Patient is competent for consent with normal mood and affect Lymphatic: No axillary or cervical lymphadenopathy Cardiovascular: No pedal edema Respiratory: No cyanosis, no use of accessory musculature GI: No organomegaly, abdomen is soft and non-tender    Images:  @ENCIMAGES @  Labs:  Lab Results  Component Value Date   HGBA1C 9.1 (H) 08/07/2023   HGBA1C 6.6 (H) 01/16/2021   HGBA1C 7.7 (A) 10/18/2020   REPTSTATUS PENDING 08/07/2023   REPTSTATUS PENDING 08/07/2023   CULT  08/07/2023    NO GROWTH 4 DAYS Performed at [facility], [address], [place], [place] [address]    CULT  08/07/2023    NO GROWTH 4 DAYS Performed at [facility], [address], [place], [place] [address]     Lab Results  Component Value Date   ALBUMIN  1.9 (L) 08/08/2023   ALBUMIN 3.8 12/25/2022   ALBUMIN 4.0 07/23/2022        Latest Ref Rng & Units 08/12/2023    3:34 AM 08/11/2023    2:59 AM 08/10/2023   11:48 AM  CBC EXTENDED  WBC 4.0 - 10.5 K/uL 10.8  11.7    RBC 4.22 - 5.81 MIL/uL 2.59  3.38    Hemoglobin 13.0 - 17.0 g/dL 7.9  08.6  8.8   HCT 57.8 - 52.0 % 24.1  31.4  26.0   Platelets 150 - 400 K/uL 306  361      Neurologic: Patient does not have protective sensation bilateral lower extremities.   MUSCULOSKELETAL:   Skin: Examination of the right lower extremity  patient has ischemic soft tissue ulcers medial aspect of the right calf.  Patient has dry gangrenous changes involving the entire right foot.  Examination of the left lower extremity there is dry gangrenous changes of the left foot.  Endovascular evaluation shows patent large vessels open to the ankle without significant stenosis with significant calcification.   Review of the radiographs shows air in the soft tissue of both feet with calcified arteries throughout the foot and ankle.  White cell count 10.8 with a hemoglobin of 7.9.  Albumin 1.9.  Hemoglobin A1c 9.1.  Assessment: Assessment: Uncontrolled type 2 diabetes with peripheral vascular disease with inline flow to the foot and ankle with gangrene of both feet and medial aspect of the right calf.  Plan: Plan: Recommended patient proceed with a bilateral below-knee amputation.  Discussed that he has increased risk of the wound not healing and need for additional surgery.  Discussed that there is increased risk in the right lower extremity compared to the left lower extremity.  Patient states he would like to think about this.  I will posted for surgery on Friday and will discuss treatment recommendations again tomorrow.  Thank you for the consult and the opportunity to see Mr. Cleora Fleet, MD [facility] [phone] 7:37 AM

## 2023-08-12 NOTE — Progress Notes (Signed)
Phone call: Received from wife expressively concerned about her husband.   Husband in bed in no apparent distress.  V/s taken and WNL. Room phone noted off the hook- pt explained he did not want to talk to her. Pt did agree to talk to her because she was really upset and he told her he just wanted to get some rest.

## 2023-08-12 NOTE — Progress Notes (Signed)
[facility]                                                                              Earl Calderon, is a 72 y.o. male, DOB - April 06, 1951, UJW:[id number] Admit date - 08/07/2023    Outpatient Primary MD for the patient is Earl Calderon, Earl Moccasin, MD  LOS - 5  days  Chief Complaint  Patient presents with   Chest Pain       Brief summary   Patient is a 72 year old male with CAD, CKD stage III, DM type II, uncontrolled, MGUS, chronic systolic CHF, NSVT, resistant hypertension, hyperlipidemia presented to ED with shortness of breath.  Per admission history, patient has been complaining of progressive shortness of breath in the last 2 weeks, generalized anterior chest pain with efforts of breathing, no fevers, cough or leg swelling. He also reported chronic leg wounds and have been worsening for the last couple of weeks, more painful. In ED, patient was noted to be hypoxic on attempts at ambulation.  08/10/2023: Underwent aortogram with bilateral lower extremity angiogram 12/11- seen by Dr. Lajoyce Corners-- b/l amputation recommended   Assessment & Plan       Acute respiratory failure with hypoxia [facility]) -Patient had presented initially for shortness of breath and hypoxia -Unclear etiology, CTA chest showed no evidence of pulmonary embolus, aortic atherosclerosis, coronary artery calcifications.  No pleural effusion or pneumothorax. -2D echo showed EF of 55 to 60%, no regional WMA, G2 DD -Stable, O2 sats 99% on room air   Bilateral lower extremity wounds, gangrene, chronic limb ischemia - Foul-smelling ulcers on examination - MRI right foot showed soft tissue ulceration along the medial aspect of first MTP joint, no osteomyelitis or abscess, nonspecific marrow edema within the second metatarsal head with questionable underlying linear subchondral low signal, could reflect [place] infarction or stress fracture -MRI left foot showed soft tissue ulceration along the plantar  aspect of the hindfoot with underlying subcutaneous edema, soft tissue emphysema consistent with soft tissue infection, possible necrotizing infection.  HSN mild calcaneal marrow T2 hyperintensity in the calcaneal tuberosity could represent hyperemia or early osteomyelitis -Vascular surgery following, underwent aortogram with bilateral lower extremity angiogram.  Prior vascular surgery, inline flow to the foot without flow-limiting stenosis bilaterally, microvascular disease, remains high risk for amputation.  No intervention to offer from a vascular surgery perspective, recommended wound care and orthopedics consult for debridement versus amputation. -Continue IV Rocephin.  Vancomycin, Zosyn discontinued due to AKI -Orthopedics consulted- recommending amputations  Acute kidney injury on CKD stage IIIa -Baseline creatinine 1.2,  presented with creatinine of 1.78, lactic acidosis -Creatinine trending up -Possibly due to contrast nephropathy from CTA, vancomycin, infection/bilateral lower extremity wounds, meds -Entresto, Aldactone, furosemide held since admission. -Placed on IV fluid hydration, renal ultrasound showed medical renal disease, no hydronephrosis or stone, -Nephrology consulted  Acute metabolic encephalopathy, likely has underlying dementia -May have underlying dementia.  CT head in 04/2022 had shown atrophy with chronic microvascular disease -UA showed> 500 glucose, small Hgb, otherwise negative for UTI -CT head showed no acute intracranial abnormality -B12 3571, folate 12.1, TSH 2.4, B1 pending     Chronic  combined systolic and diastolic heart failure (HCC) -previous echo 12/2022 had shown EF of 45 to 50% with G1 DD -BNP 200.5, -2D echo showed EF of 55 to 60%, no regional WMA, G2 DD     Type 2 diabetes mellitus with chronic kidney disease, with long-term current use of insulin (HCC) -Uncontrolled with hyperglycemia -Hemoglobin A1c 9.1 -Continue sliding scale insulin, sensitive  while inpatient  Prolonged QTc -EKG on admission shows QTc of 522 -Avoid QT prolonging meds     MGUS (monoclonal gammopathy of unknown significance) -Outpatient follow with Dr. Myna Calderon, last seen on 12/05/22  Estimated body mass index is 20.81 kg/m as calculated from the following:   Height as of this encounter: 5' 9.5" (1.765 m).   Weight as of this encounter: 64.9 kg.  Code Status: Full code DVT Prophylaxis:  enoxaparin (LOVENOX) injection 30 mg Start: 08/09/23 1000 SCDs Start: 08/07/23 2043   Level of Care: Level of care: Telemetry Medical Family Communication: called wife-- LM as she did not answer Disposition Plan:      Remains inpatient appropriate:      Procedures:  08/10/2023 Ultrasound-guided access of left common femoral artery Second order cannulation of right external iliac Aortogram Bilateral lower extremity angiogram Mynx closure of left common femoral artery access 5 minutes moderate sedation   Consultants:   Vascular surgery Nephrology Orthopedics      Medications  aspirin EC  81 mg Oral Daily   enoxaparin (LOVENOX) injection  30 mg Subcutaneous Q24H   insulin aspart  0-6 Units Subcutaneous TID WC   insulin detemir  8 Units Subcutaneous BID   lidocaine  1 patch Transdermal Q24H   sodium chloride flush  3 mL Intravenous Q12H      Subjective:   Nervous about amputations  Objective:   Vitals:   08/12/23 0412 08/12/23 0757 08/12/23 0812 08/12/23 1110  BP: (!) 129/52 (!) 135/59  (!) 117/50  Pulse:  (!) 51 (!) 54 (!) 49  Resp:  19 19 18   Temp: 98.6 F (37 C) 97.6 F (36.4 C)  97.7 F (36.5 C)  TempSrc: Oral Oral  Oral  SpO2:  100% 100% 94%  Weight:      Height:        Intake/Output Summary (Last 24 hours) at 08/12/2023 1200 Last data filed at 08/12/2023 1000 Gross per 24 hour  Intake 540 ml  Output 450 ml  Net 90 ml     Wt Readings from Last 3 Encounters:  08/07/23 64.9 kg  03/12/23 65 kg  12/29/22 66.1 kg     General:  Appearance:    elderly male in no acute distress     Lungs:      respirations unlabored  Heart:    Bradycardic.     Neurologic:   Awake, alert      Data Reviewed:  I have personally reviewed following labs    CBC Lab Results  Component Value Date   WBC 10.8 (H) 08/12/2023   RBC 2.59 (L) 08/12/2023   HGB 7.9 (L) 08/12/2023   HCT 24.1 (L) 08/12/2023   MCV 93.1 08/12/2023   MCH 30.5 08/12/2023   PLT 306 08/12/2023   MCHC 32.8 08/12/2023   RDW 12.8 08/12/2023   LYMPHSABS 1.1 12/25/2022   MONOABS 0.4 12/25/2022   EOSABS 0.1 12/25/2022   BASOSABS 0.0 12/25/2022     Last metabolic panel Lab Results  Component Value Date   NA 139 08/12/2023   K 3.4 (L) 08/12/2023   CL  107 08/12/2023   CO2 23 08/12/2023   BUN 48 (H) 08/12/2023   CREATININE 6.29 (H) 08/12/2023   GLUCOSE 114 (H) 08/12/2023   GFRNONAA 9 (L) 08/12/2023   GFRAA 78 09/26/2020   CALCIUM 7.4 (L) 08/12/2023   PHOS 2.7 11/11/2018   PROT 6.6 08/08/2023   ALBUMIN 1.9 (L) 08/08/2023   LABGLOB 4.0 (H) 12/25/2022   AGRATIO 0.9 12/25/2022   BILITOT 0.9 08/08/2023   ALKPHOS 59 08/08/2023   AST 14 (L) 08/08/2023   ALT 10 08/08/2023   ANIONGAP 9 08/12/2023    CBG (last 3)  Recent Labs    08/11/23 2117 08/12/23 0622 08/12/23 1117  GLUCAP 162* 117* 149*      Coagulation Profile: Recent Labs  Lab 08/08/23 1823  INR 1.2     Radiology Studies: I have personally reviewed the imaging studies  ECHOCARDIOGRAM COMPLETE  Result Date: 08/11/2023    ECHOCARDIOGRAM REPORT   Patient Name:   EMMA DELLAPORTA Date of Exam: 08/11/2023 Medical Rec #:  [id number]         Height:       69.5 in Accession #:    [id number]        Weight:       143.0 lb Date of Birth:  1951/06/09         BSA:          1.800 m Patient Age:    72 years          BP:           141/55 mmHg Patient Gender: M                 HR:           53 bpm. Exam Location:  Inpatient Procedure: 2D Echo, 3D Echo, Color Doppler, Cardiac Doppler and Strain  Analysis Indications:    I27.20 Pulmonary Hypertension  History:        Patient has prior history of Echocardiogram examinations, most                 recent 01/29/2023. CAD, Arrythmias:LBBB; Risk                 Factors:Hypertension, Diabetes and Dyslipidemia.  Sonographer:    Irving Burton Senior RDCS Referring Phys: [id number] [facility] GOEL IMPRESSIONS  1. Mildly decreased global longitudinal strain with some apical sparing, left ventricular mass calculated at 152.8g/m2. Left ventricular ejection fraction, by estimation, is 55 to 60%. Left ventricular ejection fraction by 3D volume is 55 %. The left ventricle has normal function. The left ventricle has no regional wall motion abnormalities. There is moderate concentric left ventricular hypertrophy. Left ventricular diastolic parameters are consistent with Grade II diastolic dysfunction (pseudonormalization). The average left ventricular global longitudinal strain is -13.9 %. The global longitudinal strain is abnormal.  2. Right ventricular systolic function is normal. The right ventricular size is normal. Moderately increased right ventricular wall thickness. There is normal pulmonary artery systolic pressure. The estimated right ventricular systolic pressure is 29.0 mmHg.  3. Left atrial size was severely dilated.  4. A small pericardial effusion is present. The pericardial effusion is circumferential.  5. The mitral valve is normal in structure. Trivial mitral valve regurgitation.  6. The aortic valve is tricuspid. There is mild thickening of the aortic valve. Aortic valve regurgitation is not visualized. Aortic valve sclerosis is present, with no evidence of aortic valve stenosis.  7. The inferior vena cava is normal in size with greater than  50% respiratory variability, suggesting right atrial pressure of 3 mmHg. Comparison(s): The left ventricular function has improved. FINDINGS  Left Ventricle: Mildly decreased global longitudinal strain with some apical sparing, left  ventricular mass calculated at 152.8g/m2. Left ventricular ejection fraction, by estimation, is 55 to 60%. Left ventricular ejection fraction by 3D volume is 55 %. The left ventricle has normal function. The left ventricle has no regional wall motion abnormalities. The average left ventricular global longitudinal strain is -13.9 %. The global longitudinal strain is abnormal. The left ventricular internal cavity size was normal in size. There is moderate concentric left ventricular hypertrophy. Abnormal (paradoxical) septal motion, consistent with left bundle branch block. Left ventricular diastolic parameters are consistent with Grade II diastolic dysfunction (pseudonormalization). Indeterminate filling pressures. Right Ventricle: The right ventricular size is normal. Moderately increased right ventricular wall thickness. Right ventricular systolic function is normal. There is normal pulmonary artery systolic pressure. The tricuspid regurgitant velocity is 2.55 m/s, and with an assumed right atrial pressure of 3 mmHg, the estimated right ventricular systolic pressure is 29.0 mmHg. Left Atrium: Left atrial size was severely dilated. Right Atrium: Right atrial size was normal in size. Pericardium: A small pericardial effusion is present. The pericardial effusion is circumferential. Mitral Valve: The mitral valve is normal in structure. Mild mitral annular calcification. Trivial mitral valve regurgitation. Tricuspid Valve: The tricuspid valve is normal in structure. Tricuspid valve regurgitation is mild. Aortic Valve: The aortic valve is tricuspid. There is mild thickening of the aortic valve. Aortic valve regurgitation is not visualized. Aortic valve sclerosis is present, with no evidence of aortic valve stenosis. Pulmonic Valve: The pulmonic valve was normal in structure. Pulmonic valve regurgitation is trivial. Aorta: The aortic root and ascending aorta are structurally normal, with no evidence of dilitation.  Venous: The inferior vena cava is normal in size with greater than 50% respiratory variability, suggesting right atrial pressure of 3 mmHg. IAS/Shunts: No atrial level shunt detected by color flow Doppler.  LEFT VENTRICLE PLAX 2D LVIDd:         3.70 cm         Diastology LVIDs:         2.50 cm         LV e' medial:    4.46 cm/s LV PW:         1.45 cm         LV E/e' medial:  11.7 LV IVS:        1.60 cm         LV e' lateral:   6.64 cm/s LVOT diam:     2.00 cm         LV E/e' lateral: 7.8 LV SV:         78 LV SV Index:   43              2D LVOT Area:     3.14 cm        Longitudinal                                Strain                                2D Strain GLS  -14.2 %                                (  A2C):                                2D Strain GLS  -15.0 %                                (A3C):                                2D Strain GLS  -12.4 %                                (A4C):                                2D Strain GLS  -13.9 %                                Avg:                                 3D Volume EF                                LV 3D EF:    Left                                             ventricul                                             ar                                             ejection                                             fraction                                             by 3D                                             volume is                                             55 %.  3D Volume EF:                                3D EF:        55 %                                LV EDV:       162 ml                                LV ESV:       74 ml                                LV SV:        88 ml RIGHT VENTRICLE RV S prime:     11.40 cm/s TAPSE (M-mode): 1.8 cm LEFT ATRIUM             Index        RIGHT ATRIUM           Index LA diam:        4.30 cm 2.39 cm/m   RA Area:     18.20 cm LA Vol (A2C):   78.6 ml 43.66 ml/m  RA Volume:   51.70 ml   28.72 ml/m LA Vol (A4C):   83.4 ml 46.32 ml/m LA Biplane Vol: 81.3 ml 45.16 ml/m  AORTIC VALVE LVOT Vmax:   122.00 cm/s LVOT Vmean:  94.400 cm/s LVOT VTI:    0.247 m  AORTA Ao Root diam: 3.30 cm Ao Asc diam:  3.50 cm MITRAL VALVE               TRICUSPID VALVE MV Area (PHT): 1.56 cm    TR Peak grad:   26.0 mmHg MV Decel Time: 485 msec    TR Vmax:        255.00 cm/s MV E velocity: 52.10 cm/s MV A velocity: 75.10 cm/s  SHUNTS MV E/A ratio:  0.69        Systemic VTI:  0.25 m                            Systemic Diam: 2.00 cm Rachelle Hora Croitoru MD Electronically signed by Thurmon Fair MD Signature Date/Time: 08/11/2023/10:55:26 AM    Final    US RENAL  Result Date: 08/10/2023 CLINICAL DATA:  Acute renal insufficiency. EXAM: RENAL / URINARY TRACT ULTRASOUND COMPLETE COMPARISON:  Abdominal ultrasound dated 11/12/2018. FINDINGS: Right Kidney: Renal measurements: 11.3 x 6.8 x 5.4 cm = volume: 218 mL. Increased parenchymal echogenicity. No hydronephrosis or shadowing stone. Left Kidney: Renal measurements: 11.5 x 5.4 x 5.3 cm = volume: 172 mL. Increased parenchymal echogenicity. No hydronephrosis or shadowing stone. Bladder: The urinary bladder is collapsed and not well visualized. Other: None. IMPRESSION: Increased renal parenchymal echogenicity may represent medical renal disease. No hydronephrosis or shadowing stone. Electronically Signed   By: Elgie Collard M.D.   On: 08/10/2023 22:17       Joseph Art DO [facility] 08/12/2023, 12:00 PM  Available via Epic secure chat 7am-7pm After 7 pm, please refer to night coverage provider listed on amion.

## 2023-08-12 NOTE — Inpatient Diabetes Management (Signed)
Inpatient Diabetes Program Recommendations  AACE/ADA: New Consensus Statement on Inpatient Glycemic Control (2015)  Target Ranges:  Prepandial:   less than 140 mg/dL      Peak postprandial:   less than 180 mg/dL (1-2 hours)      Critically ill patients:  140 - 180 mg/dL   Lab Results  Component Value Date   GLUCAP 149 (H) 08/12/2023   HGBA1C 9.1 (H) 08/07/2023    Review of Glycemic Control  Diabetes history: DM 2 Outpatient Diabetes medications: Humalog in the past (pt stopped taking due to elevating glucose levels), Jardiance 10 mg Daily, Levemir 8 units bid Current orders for Inpatient glycemic control:  Levemir 8 units bid Novolog 0-6 units tid  A1c 9.1% on 12/6  Spoke with pt at bedside regarding glucose control at home. Discussed current A1c level and reviewed glucose and A1c goals. Pt does not remember saying that Humalog doses at home would increase his glucose levels. I discussed the use of meal coverage insulin and if dosed right would prevent postprandial spikes. Discussed the plan of care for glucose control while pt is inpatient. Will follow glucose trends.  Thanks,  Christena Deem RN, MSN, BC-ADM Inpatient Diabetes Coordinator Team Pager [phone] (8a-5p)

## 2023-08-12 NOTE — Consult Note (Signed)
ORTHOPAEDIC CONSULTATION  REQUESTING PHYSICIAN: Joseph Art, DO  Chief Complaint:  Gangrenous ulcers both feet  HPI: [name] is a 72 y.o. male who presents with gangrene both feet.  Patient states this has been going on for several weeks.  Past Medical History:  Diagnosis Date   CAD in native artery 01/01/2021   Prior LAD PCI.  Currently no symptoms of ischemia.  Encouraged him to do some light exercising.  Continue aspirin, carvedilol, and rosuvastatin.   Cataract    CKD (chronic kidney disease), stage III (HCC) 08/24/2020   Coronary artery disease    Diabetes mellitus without complication (HCC)    on meds   Diabetic retinopathy (HCC)    Goals of care, counseling/discussion 07/01/2021   Hernia, inguinal    Hypertension    on meds   Hypertensive retinopathy    Lymphadenopathy, cervical 07/01/2021   MGUS (monoclonal gammopathy of unknown significance) 07/01/2021   NSVT (nonsustained ventricular tachycardia) (HCC) 08/24/2020   Pure hypercholesterolemia 08/05/2021   Resistant hypertension 11/11/2018   Uncontrolled type 2 diabetes mellitus 11/11/2018   Past Surgical History:  Procedure Laterality Date   ABDOMINAL AORTOGRAM W/LOWER EXTREMITY N/A 08/10/2023   Procedure: ABDOMINAL AORTOGRAM W/LOWER EXTREMITY;  Surgeon: Daria Pastures, MD;  Location: [facility] INVASIVE CV LAB;  Service: Cardiovascular;  Laterality: N/A;   CORONARY STENT INTERVENTION N/A 11/15/2018   Procedure: CORONARY STENT INTERVENTION;  Surgeon: Corky Crafts, MD;  Location: [facility]AS[facility];  Service: Cardiovascular;  Laterality: N/A;   HEMORROIDECTOMY     RIGHT HEART CATH N/A 07/18/2020   Procedure: RIGHT HEART CATH;  Surgeon: Iran Ouch, MD;  Location: [facility];  Service: Cardiovascular;  Laterality: N/A;   RIGHT/LEFT HEART CATH AND CORONARY ANGIOGRAPHY N/A 11/15/2018   Procedure: RIGHT/LEFT HEART CATH AND CORONARY ANGIOGRAPHY;  Surgeon: Corky Crafts, MD;   [facility] INVASIVE CV LAB;  Service: Cardiovascular;  Laterality: N/A;   WISDOM TOOTH EXTRACTION     Social History   Socioeconomic History   Marital status: Married    Spouse name: Not on file   Number of children: Not on file   Years of education: Not on file   Highest education level: Not on file  Occupational History   Not on file  Tobacco Use   Smoking status: Never   Smokeless tobacco: Never  Vaping Use   Vaping status: Never Used  Substance and Sexual Activity   Alcohol use: Not Currently   Drug use: Never   Sexual activity: Not Currently  Other Topics Concern   Not on file  Social History Narrative   Not on file   Social Determinants of Health   Financial Resource Strain: Medium Risk (09/21/2020)   Overall Financial Resource Strain (CARDIA)    Difficulty of Paying Living Expenses: Somewhat hard  Food Insecurity: No Food Insecurity (08/07/2023)   Hunger Vital Sign    Worried About Running Out of Food in the Last Year: Never true    Ran Out of Food in the Last Year: Never true  Transportation Needs: No Transportation Needs (08/07/2023)   PRAPARE - Administrator, Civil Service (Medical): No    Lack of Transportation (Non-Medical): No  Physical Activity: Not on file  Stress: Not on file  Social Connections: Not on file   Family History  Problem Relation Age of Onset   Hypertension Mother    Diabetes Mellitus II Mother    Arrhythmia Mother  Heart disease Mother    Hypertension Sister    Hypertension Brother    Heart disease Brother    Kidney disease Brother    Hypertension Other    Diabetes Mellitus II Other        All 9 sibblings have HTN   Colon cancer Neg Hx    Colon polyps Neg Hx    Esophageal cancer Neg Hx    Stomach cancer Neg Hx    Rectal cancer Neg Hx    - negative except otherwise stated in the family history section No Known Allergies Prior to Admission medications   Medication Sig Start Date End Date Taking? Authorizing  Provider  APPLE CIDER VINEGAR PO Take 1 tablet by mouth daily at 6 (six) AM.   Yes [provider]  aspirin EC (ASPIRIN LOW DOSE) 81 MG tablet Take 1 tablet (81 mg total) by mouth daily. Swallow whole. 06/26/23  Yes Chilton Si, MD  carvedilol (COREG) 6.25 MG tablet Take 1 tablet (6.25 mg total) by mouth 2 (two) times daily with a meal. 12/29/22  Yes Chilton Si, MD  ELDERBERRY PO Take 100 mg by mouth daily.   Yes [provider]  Ergocalciferol (VITAMIN D2 PO) Take 2 tablets by mouth daily.   Yes [provider]  ferrous sulfate 325 (65 FE) MG tablet Take 1 tablet (325 mg total) by mouth daily with breakfast. 11/11/21  Yes Chilton Si, MD  furosemide (LASIX) 40 MG tablet TAKE 1 TABLET BY MOUTH ONCE DAILY 10/10/21  Yes Alver Sorrow, NP  HUMALOG KWIKPEN 100 UNIT/ML KwikPen Inject into the skin. Sliding Scale 06/13/22  Yes [provider]  hydrALAZINE (APRESOLINE) 25 MG tablet TAKE 1 TABLET BY MOUTH IN THE MORNING, AND TAKE 1 TABLET AT BEDTIME Patient taking differently: Take 25 mg by mouth at bedtime. 05/18/23  Yes Chilton Si, MD  JARDIANCE 10 MG TABS tablet TAKE ONE TABLET BY MOUTH DAILY BEFORE BREAKFAST 05/12/22  Yes Chilton Si, MD  LEVEMIR FLEXTOUCH 100 UNIT/ML FlexPen Inject 8 Units into the skin 2 (two) times daily. Patient taking differently: Inject 8 Units into the skin in the morning, at noon, and at bedtime. 01/02/21  Yes Hoy Register, MD  nitroGLYCERIN (NITROSTAT) 0.4 MG SL tablet Place 1 tablet (0.4 mg total) under the tongue every 5 (five) minutes as needed for chest pain. 10/22/20 08/08/23 Yes Duke, Roe Rutherford, PA  Omega-3 Fatty Acids (FISH OIL PO) Take 1 tablet by mouth daily.   Yes [provider]  Probiotic Product (PROBIOTIC MULTI-ENZYME) TABS Take 3 tablets by mouth daily at 6 (six) AM.   Yes [provider]  rosuvastatin (CRESTOR) 40 MG tablet TAKE 1 TABLET BY MOUTH ONCE DAILY AT [facility] 04/30/23  Yes  Chilton Si, MD  sacubitril-valsartan (ENTRESTO) 97-103 MG TAKE ONE TABLET BY MOUTH TWO TIMES DAILY 05/28/23  Yes Chilton Si, MD  spironolactone (ALDACTONE) 25 MG tablet TAKE 1 TABLET BY MOUTH ONCE DAILY 08/06/23  Yes Chilton Si, MD  Turmeric 500 MG TABS Take 2 tablets by mouth daily at 6 (six) AM.   Yes [provider]  glucose blood (FREESTYLE TEST STRIPS) test strip Use as instructed 10/21/20   Grayce Sessions, NP  Insulin Pen Needle (NOVOFINE PEN NEEDLE) 32G X 6 MM MISC USE AS DIRECTED 01/11/21   Claiborne Rigg, NP   ECHOCARDIOGRAM COMPLETE  Result Date: 08/11/2023    ECHOCARDIOGRAM REPORT   Patient Name:   JAVIONE MATHERS Date of Exam: 08/11/2023 Medical Rec #:  [id number]         Height:       69.5 in Accession #:    [id number]        Weight:       143.0 lb Date of Birth:  Feb 14, 1951         BSA:          1.800 m Patient Age:    72 years          BP:           141/55 mmHg Patient Gender: M                 HR:           53 bpm. Exam Location:  Inpatient Procedure: 2D Echo, 3D Echo, Color Doppler, Cardiac Doppler and Strain Analysis Indications:    I27.20 Pulmonary Hypertension  History:        Patient has prior history of Echocardiogram examinations, most                 recent 01/29/2023. CAD, Arrythmias:LBBB; Risk                 Factors:Hypertension, Diabetes and Dyslipidemia.  Sonographer:    Irving Burton Senior RDCS Referring Phys: [id number] [facility] GOEL IMPRESSIONS  1. Mildly decreased global longitudinal strain with some apical sparing, left ventricular mass calculated at 152.8g/m2. Left ventricular ejection fraction, by estimation, is 55 to 60%. Left ventricular ejection fraction by 3D volume is 55 %. The left ventricle has normal function. The left ventricle has no regional wall motion abnormalities. There is moderate concentric left ventricular hypertrophy. Left ventricular diastolic parameters are consistent with Grade II diastolic dysfunction (pseudonormalization). The  average left ventricular global longitudinal strain is -13.9 %. The global longitudinal strain is abnormal.  2. Right ventricular systolic function is normal. The right ventricular size is normal. Moderately increased right ventricular wall thickness. There is normal pulmonary artery systolic pressure. The estimated right ventricular systolic pressure is 29.0 mmHg.  3. Left atrial size was severely dilated.  4. A small pericardial effusion is present. The pericardial effusion is circumferential.  5. The mitral valve is normal in structure. Trivial mitral valve regurgitation.  6. The aortic valve is tricuspid. There is mild thickening of the aortic valve. Aortic valve regurgitation is not visualized. Aortic valve sclerosis is present, with no evidence of aortic valve stenosis.  7. The inferior vena cava is normal in size with greater than 50% respiratory variability, suggesting right atrial pressure of 3 mmHg. Comparison(s): The left ventricular function has improved. FINDINGS  Left Ventricle: Mildly decreased global longitudinal strain with some apical sparing, left ventricular mass calculated at 152.8g/m2. Left ventricular ejection fraction, by estimation, is 55 to 60%. Left ventricular ejection fraction by 3D volume is 55 %. The left ventricle has normal function. The left ventricle has no regional wall motion abnormalities. The average left ventricular global longitudinal strain is -13.9 %. The global longitudinal strain is abnormal. The left ventricular internal cavity size was normal in size. There is moderate concentric left ventricular hypertrophy. Abnormal (paradoxical) septal motion, consistent with left bundle branch block. Left ventricular diastolic parameters are consistent with Grade II diastolic dysfunction (pseudonormalization). Indeterminate filling pressures. Right Ventricle: The right ventricular size is normal. Moderately increased right ventricular wall thickness. Right ventricular systolic  function is normal. There is normal pulmonary artery systolic pressure. The tricuspid regurgitant velocity is 2.55 m/s, and with an assumed right atrial pressure of 3 mmHg, the  estimated right ventricular systolic pressure is 29.0 mmHg. Left Atrium: Left atrial size was severely dilated. Right Atrium: Right atrial size was normal in size. Pericardium: A small pericardial effusion is present. The pericardial effusion is circumferential. Mitral Valve: The mitral valve is normal in structure. Mild mitral annular calcification. Trivial mitral valve regurgitation. Tricuspid Valve: The tricuspid valve is normal in structure. Tricuspid valve regurgitation is mild. Aortic Valve: The aortic valve is tricuspid. There is mild thickening of the aortic valve. Aortic valve regurgitation is not visualized. Aortic valve sclerosis is present, with no evidence of aortic valve stenosis. Pulmonic Valve: The pulmonic valve was normal in structure. Pulmonic valve regurgitation is trivial. Aorta: The aortic root and ascending aorta are structurally normal, with no evidence of dilitation. Venous: The inferior vena cava is normal in size with greater than 50% respiratory variability, suggesting right atrial pressure of 3 mmHg. IAS/Shunts: No atrial level shunt detected by color flow Doppler.  LEFT VENTRICLE PLAX 2D LVIDd:         3.70 cm         Diastology LVIDs:         2.50 cm         LV e' medial:    4.46 cm/s LV PW:         1.45 cm         LV E/e' medial:  11.7 LV IVS:        1.60 cm         LV e' lateral:   6.64 cm/s LVOT diam:     2.00 cm         LV E/e' lateral: 7.8 LV SV:         78 LV SV Index:   43              2D LVOT Area:     3.14 cm        Longitudinal                                Strain                                2D Strain GLS  -14.2 %                                (A2C):                                2D Strain GLS  -15.0 %                                (A3C):                                2D Strain GLS  -12.4 %                                 (A4C):  2D Strain GLS  -13.9 %                                Avg:                                 3D Volume EF                                LV 3D EF:    Left                                             ventricul                                             ar                                             ejection                                             fraction                                             by 3D                                             volume is                                             55 %.                                 3D Volume EF:                                3D EF:        55 %                                LV EDV:       162 ml                                LV ESV:       74 ml  LV SV:        88 ml RIGHT VENTRICLE RV S prime:     11.40 cm/s TAPSE (M-mode): 1.8 cm LEFT ATRIUM             Index        RIGHT ATRIUM           Index LA diam:        4.30 cm 2.39 cm/m   RA Area:     18.20 cm LA Vol (A2C):   78.6 ml 43.66 ml/m  RA Volume:   51.70 ml  28.72 ml/m LA Vol (A4C):   83.4 ml 46.32 ml/m LA Biplane Vol: 81.3 ml 45.16 ml/m  AORTIC VALVE LVOT Vmax:   122.00 cm/s LVOT Vmean:  94.400 cm/s LVOT VTI:    0.247 m  AORTA Ao Root diam: 3.30 cm Ao Asc diam:  3.50 cm MITRAL VALVE               TRICUSPID VALVE MV Area (PHT): 1.56 cm    TR Peak grad:   26.0 mmHg MV Decel Time: 485 msec    TR Vmax:        255.00 cm/s MV E velocity: 52.10 cm/s MV A velocity: 75.10 cm/s  SHUNTS MV E/A ratio:  0.69        Systemic VTI:  0.25 m                            Systemic Diam: 2.00 cm Rachelle Hora Croitoru MD Electronically signed by Thurmon Fair MD Signature Date/Time: 08/11/2023/10:55:26 AM    Final    US RENAL  Result Date: 08/10/2023 CLINICAL DATA:  Acute renal insufficiency. EXAM: RENAL / URINARY TRACT ULTRASOUND COMPLETE COMPARISON:  Abdominal ultrasound dated 11/12/2018. FINDINGS: Right Kidney: Renal  measurements: 11.3 x 6.8 x 5.4 cm = volume: 218 mL. Increased parenchymal echogenicity. No hydronephrosis or shadowing stone. Left Kidney: Renal measurements: 11.5 x 5.4 x 5.3 cm = volume: 172 mL. Increased parenchymal echogenicity. No hydronephrosis or shadowing stone. Bladder: The urinary bladder is collapsed and not well visualized. Other: None. IMPRESSION: Increased renal parenchymal echogenicity may represent medical renal disease. No hydronephrosis or shadowing stone. Electronically Signed   By: Elgie Collard M.D.   On: 08/10/2023 22:17   PERIPHERAL VASCULAR CATHETERIZATION  Result Date: 08/10/2023 Images from the original result were not included.   Patient name: Earl Calderon   MRN: [id number]        DOB: [birth year]          Sex: male  08/10/2023 Pre-operative Diagnosis: CL TI with bilateral foot wounds Post-operative diagnosis:  Same Surgeon:  Daria Pastures, MD Procedure Performed:  Ultrasound-guided access of left common femoral artery Second order cannulation of right external iliac Aortogram Bilateral lower extremity angiogram Mynx closure of left common femoral artery access 5 minutes moderate sedation   Indications: Mr. Roland is a 71 year old male with multiple significant medical comorbidities who presented to the hospital with shortness of breath was found to have bilateral foot wounds.  Noninvasive vascular labs were notable for normal ABIs bilaterally but significantly decreased toe pressures in the 60s.  Recent benefits of bilateral lower extremity angiogram with possible intervention were reviewed with the patient and the wife, they expressed understanding were willing to proceed.  Given his renal function CO2 angiography was planned.  Findings: Widely patent infrarenal aorta and bilateral iliac systems.  Right common femoral, profunda and SFA patent without  significant stenosis.  Popliteal artery patent without significant stenosis.  The AT is patent proximally but occludes at its  mid segment and is reconstituted distally at the ankle by peroneal collaterals.  The PT and peroneal are patent without significant stenosis throughout the course.  Left common femoral, profunda and SFA are patent without significant stenosis popliteal artery patent without significant stenosis.  There is three-vessel runoff without significant stenosis noted in the AT, PT or peroneal arteries.             Procedure:  The patient was identified in the holding area and taken to the cath lab  The patient was then placed supine on the table and prepped and draped in the usual sterile fashion.  A time out was called.  Ultrasound was used to evaluate the left common femoral artery.  It was patent .  A digital ultrasound image was acquired.  A micropuncture needle was used to access the left common femoral artery under ultrasound guidance.  An 018 wire was advanced without resistance and a micropuncture sheath was placed.  The 018 wire was removed and a benson wire was placed.  The micropuncture sheath was exchanged for a 5 french sheath.  An omniflush catheter was advanced over the wire to the level of L-1.  During this time patient began to desat to have respiratory distress.  The sedation was reversed with flumazenil, jaw thrust performed and a nonrebreather mask was placed.  During this time he did not have significant bradycardia down to 30 but recovered after a few minutes with a sat of 100 and a heart rate in the 70s which was his baseline.  At this time he was responding appropriately and denied any chest pain or shortness of breath.  I decided to proceed with angiography. An abdominal angiogram was obtained.  Next, using the omniflush catheter and a Bentson wire, the aortic bifurcation was crossed and the catheter was placed into theright external iliac artery and right runoff was obtained. This demonstrated the above findings. left runoff was performed via retrograde sheath injections which demonstrated the  above findings.  Contrast: None, CO2 used Sedation: 5 minutes  Impression: Inline flow to the foot without flow-limiting stenosis bilaterally. Decreased toe pressures likely due to microvascular disease.  Remains high risk for amputation.   Daria Pastures MD [facility] Office: [phone]   - pertinent xrays, CT, MRI studies were reviewed and independently interpreted  Positive ROS: All other systems have been reviewed and were otherwise negative with the exception of those mentioned in the HPI and as above.  Physical Exam: General: Alert, no acute distress Psychiatric: Patient is competent for consent with normal mood and affect Lymphatic: No axillary or cervical lymphadenopathy Cardiovascular: No pedal edema Respiratory: No cyanosis, no use of accessory musculature GI: No organomegaly, abdomen is soft and non-tender    Images:  @ENCIMAGES @  Labs:  Lab Results  Component Value Date   HGBA1C 9.1 (H) 08/07/2023   HGBA1C 6.6 (H) 01/16/2021   HGBA1C 7.7 (A) 10/18/2020   REPTSTATUS PENDING 08/07/2023   REPTSTATUS PENDING 08/07/2023   CULT  08/07/2023    NO GROWTH 4 DAYS Performed at [facility], [address], [place], [place] [address]    CULT  08/07/2023    NO GROWTH 4 DAYS Performed at [facility], [address], [place], [place] [address]     Lab Results  Component Value Date   ALBUMIN  1.9 (L) 08/08/2023   ALBUMIN 3.8 12/25/2022   ALBUMIN 4.0 07/23/2022        Latest Ref Rng & Units 08/12/2023    3:34 AM 08/11/2023    2:59 AM 08/10/2023   11:48 AM  CBC EXTENDED  WBC 4.0 - 10.5 K/uL 10.8  11.7    RBC 4.22 - 5.81 MIL/uL 2.59  3.38    Hemoglobin 13.0 - 17.0 g/dL 7.9  08.6  8.8   HCT 57.8 - 52.0 % 24.1  31.4  26.0   Platelets 150 - 400 K/uL 306  361      Neurologic: Patient does not have protective sensation bilateral lower extremities.   MUSCULOSKELETAL:   Skin: Examination of the right lower extremity  patient has ischemic soft tissue ulcers medial aspect of the right calf.  Patient has dry gangrenous changes involving the entire right foot.  Examination of the left lower extremity there is dry gangrenous changes of the left foot.  Endovascular evaluation shows patent large vessels open to the ankle without significant stenosis with significant calcification.   Review of the radiographs shows air in the soft tissue of both feet with calcified arteries throughout the foot and ankle.  White cell count 10.8 with a hemoglobin of 7.9.  Albumin 1.9.  Hemoglobin A1c 9.1.  Assessment: Assessment: Uncontrolled type 2 diabetes with peripheral vascular disease with inline flow to the foot and ankle with gangrene of both feet and medial aspect of the right calf.  Plan: Plan: Recommended patient proceed with a bilateral below-knee amputation.  Discussed that he has increased risk of the wound not healing and need for additional surgery.  Discussed that there is increased risk in the right lower extremity compared to the left lower extremity.  Patient states he would like to think about this.  I will posted for surgery on Friday and will discuss treatment recommendations again tomorrow.  Thank you for the consult and the opportunity to see Mr. Cleora Fleet, MD [facility] [phone] 7:37 AM

## 2023-08-13 DIAGNOSIS — J9601 Acute respiratory failure with hypoxia: Secondary | ICD-10-CM | POA: Diagnosis not present

## 2023-08-13 LAB — CBC
HCT: 25.1 % — ABNORMAL LOW (ref 39.0–52.0)
Hemoglobin: 8.1 g/dL — ABNORMAL LOW (ref 13.0–17.0)
MCH: 30.2 pg (ref 26.0–34.0)
MCHC: 32.3 g/dL (ref 30.0–36.0)
MCV: 93.7 fL (ref 80.0–100.0)
Platelets: 357 10*3/uL (ref 150–400)
RBC: 2.68 MIL/uL — ABNORMAL LOW (ref 4.22–5.81)
RDW: 12.8 % (ref 11.5–15.5)
WBC: 9.8 10*3/uL (ref 4.0–10.5)
nRBC: 0 % (ref 0.0–0.2)

## 2023-08-13 LAB — BASIC METABOLIC PANEL
Anion gap: 7 (ref 5–15)
BUN: 48 mg/dL — ABNORMAL HIGH (ref 8–23)
CO2: 24 mmol/L (ref 22–32)
Calcium: 7.7 mg/dL — ABNORMAL LOW (ref 8.9–10.3)
Chloride: 109 mmol/L (ref 98–111)
Creatinine, Ser: 5.28 mg/dL — ABNORMAL HIGH (ref 0.61–1.24)
GFR, Estimated: 11 mL/min — ABNORMAL LOW (ref >60)
Glucose, Bld: 135 mg/dL — ABNORMAL HIGH (ref 70–99)
Potassium: 3.6 mmol/L (ref 3.5–5.1)
Sodium: 140 mmol/L (ref 135–145)

## 2023-08-13 LAB — SURGICAL PCR SCREEN
MRSA, PCR: NEGATIVE
Staphylococcus aureus: NEGATIVE

## 2023-08-13 LAB — GLUCOSE, CAPILLARY
Glucose-Capillary: 165 mg/dL — ABNORMAL HIGH (ref 70–99)
Glucose-Capillary: 273 mg/dL — ABNORMAL HIGH (ref 70–99)
Glucose-Capillary: 139 mg/dL — ABNORMAL HIGH (ref 70–99)
Glucose-Capillary: 95 mg/dL (ref 70–99)

## 2023-08-13 MED ORDER — TRANEXAMIC ACID 1000 MG/10ML IV SOLN
2000.0000 mg | INTRAVENOUS | Status: DC
  Filled 2023-08-13 (×2): qty 20

## 2023-08-13 MED ORDER — CHLORHEXIDINE GLUCONATE 4 % EX SOLN
60.0000 mL | Freq: Once | CUTANEOUS | Status: AC
  Administered 2023-08-14: 4 via TOPICAL

## 2023-08-13 NOTE — Progress Notes (Signed)
Mobility Specialist Progress Note:   08/13/23 1612  Mobility  Activity Transferred to/from [facility]  Level of Assistance Contact guard assist, steadying assist  Assistive Device None  Distance Ambulated (ft) 2 ft  Activity Response Tolerated well  Mobility Referral Yes  Mobility visit 1 Mobility  Mobility Specialist Start Time (ACUTE ONLY) 1600  Mobility Specialist Stop Time (ACUTE ONLY) 1608  Mobility Specialist Time Calculation (min) (ACUTE ONLY) 8 min    Pt received in bed, requesting assistance to [facility] CG to stand and pivot. Asx throughout. Pt left on [facility] and reminded to use call bell when ready.   Leory Plowman  Mobility Specialist Please contact via [employer] office at [phone]

## 2023-08-13 NOTE — Progress Notes (Signed)
  Daily Progress Note  S/p:bilateral lower extremity angiogram  Subjective: No new complaints  Objective: Vitals:   08/12/23 2305 08/13/23 0305  BP: 138/64 (!) 166/70  Pulse: (!) 55 60  Resp: 12 15  Temp: 98 F (36.7 C) 97.9 F (36.6 C)  SpO2: 100% 100%    Physical Examination HDS Left femoral access without hematoma  ASSESSMENT/PLAN:  72 year old male with diabetes and severe nonhealing wounds of his feet bilaterally.  Angiogram demonstrated inline flow to the foot without flow-limiting stenosis bilaterally.  Decreased poor toe pressures likely due to microvascular disease.  I explained to him and his wife after the angiogram on Monday that he is high risk for amputation.  Today I explained that I agree with Dr. Lajoyce Corners that his wounds would likely require bilateral amputation. He reported that he does not remember having this conversation earlier. We discussed that he would at least need L BKA given his heal wound and osteomyelitis of the left calcaneous.  He is posted with Dr. Lajoyce Corners for b/l amputation on Friday but patient still thinking about whether to proceed.    Daria Pastures MD MS [facility] [phone] 08/13/2023  7:51 AM

## 2023-08-13 NOTE — Progress Notes (Signed)
[facility]                                                                              Earl Calderon, is a 72 y.o. male, DOB - 12-Feb-1951, HYQ:[id number] Admit date - 08/07/2023    Outpatient Primary MD for the patient is Earl Calderon, Earl Moccasin, MD  LOS - 6  days  Chief Complaint  Patient presents with   Chest Pain       Brief summary   Patient is a 72 year old male with CAD, CKD stage III, DM type II, uncontrolled, MGUS, chronic systolic CHF, NSVT, resistant hypertension, hyperlipidemia presented to ED with shortness of breath.  Per admission history, patient has been complaining of progressive shortness of breath in the last 2 weeks, generalized anterior chest pain with efforts of breathing, no fevers, cough or leg swelling. He also reported chronic leg wounds and have been worsening for the last couple of weeks, more painful. In ED, patient was noted to be hypoxic on attempts at ambulation.  08/10/2023: Underwent aortogram with bilateral lower extremity angiogram 12/11- seen by Dr. Lajoyce Corners-- b/l amputation recommended   Assessment & Plan       Acute respiratory failure with hypoxia [facility]) -Patient had presented initially for shortness of breath and hypoxia -Unclear etiology, CTA chest showed no evidence of pulmonary embolus, aortic atherosclerosis, coronary artery calcifications.  No pleural effusion or pneumothorax. -2D echo showed EF of 55 to 60%, no regional WMA, G2 DD -Stable, O2 sats 99% on room air   Bilateral lower extremity wounds, gangrene, chronic limb ischemia - Foul-smelling ulcers on examination - MRI right foot showed soft tissue ulceration along the medial aspect of first MTP joint, no osteomyelitis or abscess, nonspecific marrow edema within the second metatarsal head with questionable underlying linear subchondral low signal, could reflect Fre[place]erg infarction or stress fracture -MRI left foot showed soft tissue ulceration along the plantar  aspect of the hindfoot with underlying subcutaneous edema, soft tissue emphysema consistent with soft tissue infection, possible necrotizing infection.  HSN mild calcaneal marrow T2 hyperintensity in the calcaneal tuberosity could represent hyperemia or early osteomyelitis -Vascular surgery following, underwent aortogram with bilateral lower extremity angiogram.  Prior vascular surgery, inline flow to the foot without flow-limiting stenosis bilaterally, microvascular disease, remains high risk for amputation.  No intervention to offer from a vascular surgery perspective, recommended wound care and orthopedics consult for debridement versus amputation. -Continue IV Rocephin.  Vancomycin, Zosyn discontinued due to AKI -Orthopedics consulted- recommending amputations  Acute kidney injury on CKD stage IIIa -Baseline creatinine 1.2,  presented with creatinine of 1.78, lactic acidosis -Creatinine trending up -Possibly due to contrast nephropathy from CTA, vancomycin, infection/bilateral lower extremity wounds, meds -Entresto, Aldactone, furosemide held since admission. -Placed on IV fluid hydration, renal ultrasound showed medical renal disease, no hydronephrosis or stone, -Nephrology consulted  Acute metabolic encephalopathy, likely has underlying dementia -May have underlying dementia.  CT head in 04/2022 had shown atrophy with chronic microvascular disease -UA showed> 500 glucose, small Hgb, otherwise negative for UTI -CT head showed no acute intracranial abnormality -B12 3571, folate 12.1, TSH 2.4, B1 pending     Chronic  combined systolic and diastolic heart failure (HCC) -previous echo 12/2022 had shown EF of 45 to 50% with G1 DD -BNP 200.5, -2D echo showed EF of 55 to 60%, no regional WMA, G2 DD     Type 2 diabetes mellitus with chronic kidney disease, with long-term current use of insulin (HCC) -Uncontrolled with hyperglycemia -Hemoglobin A1c 9.1 -Continue sliding scale insulin, sensitive  while inpatient  Prolonged QTc -EKG on admission shows QTc of 522 -Avoid QT prolonging meds     MGUS (monoclonal gammopathy of unknown significance) -Outpatient follow with Dr. [name], last seen on 12/05/22  Estimated body mass index is 20.81 kg/m as calculated from the following:   Height as of this encounter: 5' 9.5" (1.765 m).   Weight as of this encounter: 64.9 kg.   Not sure patient understands fully about the recommendations-- have updated wife and will get palliative care consult to help with GOC-- wife says he is a very proud man    Code Status: Full code DVT Prophylaxis:  enoxaparin (LOVENOX) injection 30 mg Start: 08/09/23 1000 SCDs Start: 08/07/23 2043   Level of Care: Level of care: Telemetry Medical Family Communication: spoke with wife Disposition Plan:      Remains inpatient appropriate:      Procedures:  08/10/2023 Ultrasound-guided access of left common femoral artery Second order cannulation of right external iliac Aortogram Bilateral lower extremity angiogram Mynx closure of left common femoral artery access 5 minutes moderate sedation   Consultants:   Vascular surgery Nephrology Orthopedics Palliative care      Medications  aspirin EC  81 mg Oral Daily   enoxaparin (LOVENOX) injection  30 mg Subcutaneous Q24H   insulin aspart  0-6 Units Subcutaneous TID WC   insulin detemir  8 Units Subcutaneous BID   lidocaine  1 patch Transdermal Q24H   sodium chloride flush  3 mL Intravenous Q12H      Subjective:   Asking about rehab from the surgery and walking again  Objective:   Vitals:   08/12/23 2305 08/13/23 0305 08/13/23 0800 08/13/23 1100  BP: 138/64 (!) 166/70 (!) 148/61 (!) 140/53  Pulse: (!) 55 60 (!) 59 (!) 51  Resp: 12 15 16 16   Temp: 98 F (36.7 C) 97.9 F (36.6 C) 98.1 F (36.7 C) 98.4 F (36.9 C)  TempSrc: Oral Oral Oral Oral  SpO2: 100% 100% 100% 100%  Weight:      Height:        Intake/Output Summary (Last 24  hours) at 08/13/2023 1225 Last data filed at 08/13/2023 1100 Gross per 24 hour  Intake 480 ml  Output 733 ml  Net -253 ml     Wt Readings from Last 3 Encounters:  08/07/23 64.9 kg  03/12/23 65 kg  12/29/22 66.1 kg     General: Appearance:    Well developed, well nourished male in no acute distress     Lungs:      respirations unlabored  Heart:    Bradycardic. Normal rhythm. No murmurs, rubs, or gallops.   MS:   All extremities are intact.   Neurologic:   Awake, alert      Data Reviewed:  I have personally reviewed following labs    CBC Lab Results  Component Value Date   WBC 9.8 08/13/2023   RBC 2.68 (L) 08/13/2023   HGB 8.1 (L) 08/13/2023   HCT 25.1 (L) 08/13/2023   MCV 93.7 08/13/2023   MCH 30.2 08/13/2023   PLT 357 08/13/2023  MCHC 32.3 08/13/2023   RDW 12.8 08/13/2023   LYMPHSABS 1.1 12/25/2022   MONOABS 0.4 12/25/2022   EOSABS 0.1 12/25/2022   BASOSABS 0.0 12/25/2022     Last metabolic panel Lab Results  Component Value Date   NA 140 08/13/2023   K 3.6 08/13/2023   CL 109 08/13/2023   CO2 24 08/13/2023   BUN 48 (H) 08/13/2023   CREATININE 5.28 (H) 08/13/2023   GLUCOSE 135 (H) 08/13/2023   GFRNONAA 11 (L) 08/13/2023   GFRAA 78 09/26/2020   CALCIUM 7.7 (L) 08/13/2023   PHOS 2.7 11/11/2018   PROT 6.6 08/08/2023   ALBUMIN 1.9 (L) 08/08/2023   LABGLOB 4.0 (H) 12/25/2022   AGRATIO 0.9 12/25/2022   BILITOT 0.9 08/08/2023   ALKPHOS 59 08/08/2023   AST 14 (L) 08/08/2023   ALT 10 08/08/2023   ANIONGAP 7 08/13/2023    CBG (last 3)  Recent Labs    08/12/23 2109 08/13/23 0627 08/13/23 1149  GLUCAP 153* 165* 273*      Coagulation Profile: Recent Labs  Lab 08/08/23 1823  INR 1.2     Radiology Studies: I have personally reviewed the imaging studies  No results found.     Joseph Art DO [facility] 08/13/2023, 12:25 PM  Available via Epic secure chat 7am-7pm After 7 pm, please refer to night coverage provider listed on  amion.

## 2023-08-13 NOTE — Progress Notes (Signed)
Nephrology Follow-Up Consult note   Assessment/Recommendations: Earl Calderon is a/an 72 y.o. male with a past medical history significant for CAD, CKD 3B, DM2, HFrEF (30-35% previously recently improved), HTN, HLD who present w/ hypoxia and foot wounds now c/b AKI    AKI on CKD 3a: BL around 1.3 but hard to say definitively. Unclear cause of AKI but contrast exposure contributing and acting like ATN possibly with some low BP on day of admission and possibly low BP at home. AIN or toxicity associated with vanc is also possible.  No signs of uremia at this time.  Creatinine starting to improve -Continue with supportive care -Oral hydration -monitor UOP closely -Continue to monitor daily Cr, Dose meds for GFR -Monitor Daily I/Os, Daily weight  -Maintain MAP>65 for optimal renal perfusion.  -Avoid nephrotoxic medications including NSAIDs -Use synthetic opioids (Fentanyl/Dilaudid) if needed -Check Renal U/S to rule out obstruction -Currently no indication for HD but monitor closely   PAD/lower extremity wounds: s/p arteriogram with VVS 12/9.  Plan for bilateral BKA likely on Friday   Acute hypoxic respiratory failure: improved with supplemental O2.  Management per primary.   HFrEF: does not appear overloaded. Echo with normal ejection fraction.  Stopped IV fluids   Hypertension: Blood pressure fluctuating at times.  Continue to monitor closely.   Uncontrolled Diabetes Mellitus Type 2 with Hyperglycemia: Management per primary   Anemia: Likely multifactorial.  Avoiding iron given lower extremity wounds.  Consider ESA.  Transfuse as needed   Recommendations conveyed to primary service.    [facility] 08/13/2023 9:39 AM  ___________________________________________________________  CC: Lower extremity wounds  Interval History/Subjective: Patient has no complaints today.  Denies fevers or chills.  No chest pain or shortness of  breath.   Medications:  Current Facility-Administered Medications  Medication Dose Route Frequency Provider Last Rate Last Admin   acetaminophen (TYLENOL) tablet 650 mg  650 mg Oral Q6H PRN Nolberto Hanlon, MD   650 mg at 08/12/23 0932   Or   acetaminophen (TYLENOL) suppository 650 mg  650 mg Rectal Q6H PRN Nolberto Hanlon, MD       aspirin EC tablet 81 mg  81 mg Oral Daily Nolberto Hanlon, MD   81 mg at 08/13/23 0934   cefTRIAXone (ROCEPHIN) 1 g in sodium chloride 0.9 % 100 mL IVPB  1 g Intravenous Daily Rai, Ripudeep K, MD 200 mL/hr at 08/12/23 0913 1 g at 08/12/23 0913   enoxaparin (LOVENOX) injection 30 mg  30 mg Subcutaneous Q24H Rai, Ripudeep K, MD   30 mg at 08/13/23 0934   insulin aspart (novoLOG) injection 0-6 Units  0-6 Units Subcutaneous TID WC Rai, Ripudeep K, MD   1 Units at 08/13/23 0634   insulin detemir (LEVEMIR) injection 8 Units  8 Units Subcutaneous BID Nolberto Hanlon, MD   8 Units at 08/13/23 0937   lidocaine (LIDODERM) 5 % 1 patch  1 patch Transdermal Q24H Daria Pastures, MD   1 patch at 08/12/23 1445   LORazepam (ATIVAN) tablet 1 mg  1 mg Oral Q8H PRN Rai, Ripudeep K, MD       Or   LORazepam (ATIVAN) injection 1 mg  1 mg Intramuscular Q8H PRN Rai, Ripudeep K, MD       ondansetron (ZOFRAN) injection 4 mg  4 mg Intravenous Q6H PRN Benjamine Mola, Jessica U, DO   4 mg at 08/12/23 1546   polyethylene glycol (MIRALAX / GLYCOLAX) packet 17 g  17 g Oral Daily  PRN Nolberto Hanlon, MD       sodium chloride flush (NS) 0.9 % injection 3 mL  3 mL Intravenous Q12H Nolberto Hanlon, MD   3 mL at 08/12/23 2131      Review of Systems: 10 systems reviewed and negative except per interval history/subjective  Physical Exam: Vitals:   08/13/23 0305 08/13/23 0800  BP: (!) 166/70 (!) 148/61  Pulse: 60 (!) 59  Resp: 15 16  Temp: 97.9 F (36.6 C) 98.1 F (36.7 C)  SpO2: 100% 100%   Total I/O In: -  Out: 100 [Urine:100]  Intake/Output Summary (Last 24 hours) at 08/13/2023 2440 Last data filed at 08/13/2023  1027 Gross per 24 hour  Intake 240 ml  Output 833 ml  Net -593 ml   Constitutional: Lying in bed, no distress ENMT: ears and nose without scars or lesions, MMM CV: normal rate, no edema Respiratory: Bilateral chest rise, normal work of breathing Gastrointestinal: soft, non-tender, no palpable masses or hernias Skin: Chronic skin changes on the bilateral feet with wounds present, otherwise no rashes Psych: alert, judgement/insight appropriate, depressed mood and normal affect   Test Results I personally reviewed new and old clinical labs and radiology tests Lab Results  Component Value Date   NA 140 08/13/2023   K 3.6 08/13/2023   CL 109 08/13/2023   CO2 24 08/13/2023   BUN 48 (H) 08/13/2023   CREATININE 5.28 (H) 08/13/2023   CALCIUM 7.7 (L) 08/13/2023   ALBUMIN 1.9 (L) 08/08/2023   PHOS 2.7 11/11/2018    CBC Recent Labs  Lab 08/11/23 0259 08/12/23 0334 08/13/23 0250  WBC 11.7* 10.8* 9.8  HGB 10.3* 7.9* 8.1*  HCT 31.4* 24.1* 25.1*  MCV 92.9 93.1 93.7  PLT 361 306 357

## 2023-08-13 NOTE — Progress Notes (Signed)
   08/13/23 1601  TOC Brief Assessment  Insurance and Status Reviewed  Patient has primary care physician Yes  Home environment has been reviewed home w/ wife  Prior level of function: assist- plan for bil BKA on 12/13  Prior/Current Home Services No current home services  Social Drivers of Health Review SDOH reviewed no interventions necessary  Readmission risk has been reviewed Yes  Transition of care needs transition of care needs identified, TOC will continue to follow     Note plan for OR on 12/13 for Bil BKA with Dr. Lajoyce Corners. TOC  will continue to monitor patient advancement post op through interdisciplinary progression rounds. If new patient transition needs arise, please place a TOC consult.

## 2023-08-14 ENCOUNTER — Inpatient Hospital Stay (HOSPITAL_COMMUNITY): Payer: 59 | Admitting: Certified Registered Nurse Anesthetist

## 2023-08-14 ENCOUNTER — Encounter (HOSPITAL_COMMUNITY): Payer: Self-pay | Admitting: Internal Medicine

## 2023-08-14 ENCOUNTER — Encounter (HOSPITAL_COMMUNITY)
Admission: EM | Disposition: A | Discharge status: Skilled Nursing Facility | Payer: Self-pay | Source: Home / Self Care | Attending: Internal Medicine

## 2023-08-14 ENCOUNTER — Other Ambulatory Visit: Payer: Self-pay

## 2023-08-14 DIAGNOSIS — Z89511 Acquired absence of right leg below knee: Secondary | ICD-10-CM

## 2023-08-14 DIAGNOSIS — E1152 Type 2 diabetes mellitus with diabetic peripheral angiopathy with gangrene: Secondary | ICD-10-CM

## 2023-08-14 DIAGNOSIS — J9601 Acute respiratory failure with hypoxia: Secondary | ICD-10-CM | POA: Diagnosis not present

## 2023-08-14 DIAGNOSIS — I96 Gangrene, not elsewhere classified: Secondary | ICD-10-CM | POA: Diagnosis not present

## 2023-08-14 HISTORY — PX: AMPUTATION: SHX166

## 2023-08-14 LAB — GLUCOSE, CAPILLARY
Glucose-Capillary: 189 mg/dL — ABNORMAL HIGH (ref 70–99)
Glucose-Capillary: 161 mg/dL — ABNORMAL HIGH (ref 70–99)
Glucose-Capillary: 159 mg/dL — ABNORMAL HIGH (ref 70–99)
Glucose-Capillary: 267 mg/dL — ABNORMAL HIGH (ref 70–99)
Glucose-Capillary: 483 mg/dL — ABNORMAL HIGH (ref 70–99)

## 2023-08-14 LAB — CBC
HCT: 24.7 % — ABNORMAL LOW (ref 39.0–52.0)
Hemoglobin: 8.1 g/dL — ABNORMAL LOW (ref 13.0–17.0)
MCH: 30.5 pg (ref 26.0–34.0)
MCHC: 32.8 g/dL (ref 30.0–36.0)
MCV: 92.9 fL (ref 80.0–100.0)
Platelets: 371 10*3/uL (ref 150–400)
RBC: 2.66 MIL/uL — ABNORMAL LOW (ref 4.22–5.81)
RDW: 12.8 % (ref 11.5–15.5)
WBC: 8.4 10*3/uL (ref 4.0–10.5)
nRBC: 0 % (ref 0.0–0.2)

## 2023-08-14 LAB — BASIC METABOLIC PANEL
Anion gap: 7 (ref 5–15)
BUN: 40 mg/dL — ABNORMAL HIGH (ref 8–23)
CO2: 24 mmol/L (ref 22–32)
Calcium: 7.7 mg/dL — ABNORMAL LOW (ref 8.9–10.3)
Chloride: 108 mmol/L (ref 98–111)
Creatinine, Ser: 3.4 mg/dL — ABNORMAL HIGH (ref 0.61–1.24)
GFR, Estimated: 19 mL/min — ABNORMAL LOW (ref >60)
Glucose, Bld: 218 mg/dL — ABNORMAL HIGH (ref 70–99)
Potassium: 3.6 mmol/L (ref 3.5–5.1)
Sodium: 139 mmol/L (ref 135–145)

## 2023-08-14 LAB — PREPARE RBC (CROSSMATCH)

## 2023-08-14 SURGERY — AMPUTATION BELOW KNEE
Anesthesia: General | Site: Knee | Laterality: Bilateral

## 2023-08-14 MED ORDER — LIDOCAINE 2% (20 MG/ML) 5 ML SYRINGE
INTRAMUSCULAR | Status: DC | PRN
  Administered 2023-08-14: 60 mg via INTRAVENOUS

## 2023-08-14 MED ORDER — OXYCODONE HCL 5 MG/5ML PO SOLN: 5.0000 mg | Freq: Once | ORAL | Status: DC | PRN

## 2023-08-14 MED ORDER — VITAMIN C 500 MG PO TABS
1000.0000 mg | ORAL_TABLET | Freq: Every day | ORAL | Status: DC
  Administered 2023-08-14 – 2023-09-03 (×20): 1000 mg via ORAL
  Filled 2023-08-14 (×21): qty 2

## 2023-08-14 MED ORDER — FENTANYL CITRATE (PF) 250 MCG/5ML IJ SOLN
INTRAMUSCULAR | Status: DC | PRN
  Administered 2023-08-14: 150 ug via INTRAVENOUS

## 2023-08-14 MED ORDER — EPHEDRINE SULFATE-NACL 50-0.9 MG/10ML-% IV SOSY
PREFILLED_SYRINGE | INTRAVENOUS | Status: DC | PRN
  Administered 2023-08-14 (×3): 5 mg via INTRAVENOUS

## 2023-08-14 MED ORDER — DEXAMETHASONE SODIUM PHOSPHATE 10 MG/ML IJ SOLN
INTRAMUSCULAR | Status: DC | PRN
  Administered 2023-08-14: 10 mg via INTRAVENOUS

## 2023-08-14 MED ORDER — VASHE WOUND IRRIGATION OPTIME
TOPICAL | Status: DC | PRN
  Administered 2023-08-14: 68 [oz_av]

## 2023-08-14 MED ORDER — FENTANYL CITRATE (PF) 250 MCG/5ML IJ SOLN
INTRAMUSCULAR | Status: AC
  Filled 2023-08-14: qty 5

## 2023-08-14 MED ORDER — POTASSIUM CHLORIDE CRYS ER 20 MEQ PO TBCR
20.0000 meq | EXTENDED_RELEASE_TABLET | Freq: Every day | ORAL | Status: DC | PRN
Start: 2023-08-14 — End: 2023-08-19

## 2023-08-14 MED ORDER — POVIDONE-IODINE 10 % EX SWAB
2.0000 | Freq: Once | CUTANEOUS | Status: AC
Start: 2023-08-14 — End: 2023-08-14
  Administered 2023-08-14: 2 via TOPICAL

## 2023-08-14 MED ORDER — CEFAZOLIN SODIUM-DEXTROSE 2-4 GM/100ML-% IV SOLN
2.0000 g | INTRAVENOUS | Status: AC
  Administered 2023-08-14: 2 g via INTRAVENOUS
  Filled 2023-08-14: qty 100

## 2023-08-14 MED ORDER — ONDANSETRON HCL 4 MG/2ML IJ SOLN: 4.0000 mg | Freq: Four times a day (QID) | INTRAMUSCULAR | Status: DC | PRN

## 2023-08-14 MED ORDER — HYDRALAZINE HCL 25 MG PO TABS
25.0000 mg | ORAL_TABLET | Freq: Four times a day (QID) | ORAL | Status: DC | PRN
  Administered 2023-08-16 – 2023-08-18 (×3): 25 mg via ORAL
  Filled 2023-08-14 (×3): qty 1

## 2023-08-14 MED ORDER — CHLORHEXIDINE GLUCONATE 0.12 % MT SOLN
15.0000 mL | Freq: Once | OROMUCOSAL | Status: AC
Start: 2023-08-14 — End: 2023-08-14

## 2023-08-14 MED ORDER — TRANEXAMIC ACID-NACL 1000-0.7 MG/100ML-% IV SOLN
1000.0000 mg | INTRAVENOUS | Status: AC
  Administered 2023-08-14: 1000 mg via INTRAVENOUS
  Filled 2023-08-14: qty 100

## 2023-08-14 MED ORDER — OXYCODONE HCL 5 MG PO TABS: 5.0000 mg | ORAL_TABLET | Freq: Once | ORAL | Status: DC | PRN

## 2023-08-14 MED ORDER — 0.9 % SODIUM CHLORIDE (POUR BTL) OPTIME
TOPICAL | Status: DC | PRN
  Administered 2023-08-14: 1000 mL

## 2023-08-14 MED ORDER — ALBUMIN HUMAN 5 % IV SOLN: INTRAVENOUS | Status: DC | PRN

## 2023-08-14 MED ORDER — SODIUM CHLORIDE 0.9 % IV SOLN: INTRAVENOUS | Status: DC

## 2023-08-14 MED ORDER — FENTANYL CITRATE (PF) 100 MCG/2ML IJ SOLN
25.0000 ug | INTRAMUSCULAR | Status: DC | PRN
Start: 2023-08-14 — End: 2023-08-14

## 2023-08-14 MED ORDER — ZINC SULFATE 220 (50 ZN) MG PO CAPS
220.0000 mg | ORAL_CAPSULE | Freq: Every day | ORAL | Status: AC
  Administered 2023-08-14 – 2023-08-27 (×13): 220 mg via ORAL
  Filled 2023-08-14 (×14): qty 1

## 2023-08-14 MED ORDER — PROPOFOL 10 MG/ML IV BOLUS
INTRAVENOUS | Status: DC | PRN
  Administered 2023-08-14: 160 mg via INTRAVENOUS

## 2023-08-14 MED ORDER — JUVEN PO PACK
1.0000 | PACK | Freq: Two times a day (BID) | ORAL | Status: DC
  Administered 2023-08-15 – 2023-09-03 (×31): 1 via ORAL
  Filled 2023-08-14 (×32): qty 1

## 2023-08-14 MED ORDER — VANCOMYCIN HCL 1000 MG IV SOLR
INTRAVENOUS | Status: AC
  Filled 2023-08-14: qty 40

## 2023-08-14 MED ORDER — ORAL CARE MOUTH RINSE: 15.0000 mL | Freq: Once | OROMUCOSAL | Status: AC

## 2023-08-14 MED ORDER — MAGNESIUM SULFATE 2 GM/50ML IV SOLN: 2.0000 g | Freq: Every day | INTRAVENOUS | Status: DC | PRN

## 2023-08-14 MED ORDER — CHLORHEXIDINE GLUCONATE 0.12 % MT SOLN
OROMUCOSAL | Status: AC
  Administered 2023-08-14: 15 mL via OROMUCOSAL
  Filled 2023-08-14: qty 15

## 2023-08-14 MED ORDER — ROCURONIUM BROMIDE 10 MG/ML (PF) SYRINGE
PREFILLED_SYRINGE | INTRAVENOUS | Status: DC | PRN
  Administered 2023-08-14: 50 mg via INTRAVENOUS

## 2023-08-14 MED ORDER — VANCOMYCIN HCL 1000 MG IV SOLR
INTRAVENOUS | Status: DC | PRN
  Administered 2023-08-14: 1000 mg

## 2023-08-14 MED ORDER — SUGAMMADEX SODIUM 200 MG/2ML IV SOLN
INTRAVENOUS | Status: DC | PRN
  Administered 2023-08-14: 200 mg via INTRAVENOUS

## 2023-08-14 MED ORDER — ONDANSETRON HCL 4 MG/2ML IJ SOLN
INTRAMUSCULAR | Status: DC | PRN
  Administered 2023-08-14: 4 mg via INTRAVENOUS

## 2023-08-14 SURGICAL SUPPLY — 38 items
BAG COUNTER SPONGE SURGICOUNT (BAG) IMPLANT
BLADE SAW RECIP 87.9 MT (BLADE) ×1 IMPLANT
BLADE SURG 21 STRL SS (BLADE) ×1 IMPLANT
BNDG COHESIVE 6X5 TAN NS LF (GAUZE/BANDAGES/DRESSINGS) IMPLANT
BNDG COHESIVE 6X5 TAN ST LF (GAUZE/BANDAGES/DRESSINGS) IMPLANT
CANISTER WOUND CARE 500ML ATS (WOUND CARE) ×1 IMPLANT
CANISTER WOUNDNEG PRESSURE 500 (CANNISTER) IMPLANT
CLEANSER WND VASHE 34 (WOUND CARE) IMPLANT
COVER SURGICAL LIGHT HANDLE (MISCELLANEOUS) ×1 IMPLANT
CUFF TRNQT CYL 34X4.125X (TOURNIQUET CUFF) ×1 IMPLANT
DRAPE DERMATAC (DRAPES) IMPLANT
DRAPE INCISE IOBAN 66X45 STRL (DRAPES) ×1 IMPLANT
DRAPE U-SHAPE 47X51 STRL (DRAPES) ×1 IMPLANT
DRESSING PREVENA PLUS CUSTOM (GAUZE/BANDAGES/DRESSINGS) ×1 IMPLANT
DRSG PREVENA PLUS CUSTOM (GAUZE/BANDAGES/DRESSINGS) ×2
DURAPREP 26ML APPLICATOR (WOUND CARE) ×1 IMPLANT
ELECT REM PT RETURN 9FT ADLT (ELECTROSURGICAL) ×1
ELECTRODE REM PT RTRN 9FT ADLT (ELECTROSURGICAL) ×1 IMPLANT
GLOVE BIOGEL PI IND STRL 9 (GLOVE) ×1 IMPLANT
GLOVE SURG ORTHO 9.0 STRL STRW (GLOVE) ×1 IMPLANT
GOWN STRL REUS W/ TWL XL LVL3 (GOWN DISPOSABLE) ×2 IMPLANT
GRAFT SKIN WND MICRO 38 (Tissue) IMPLANT
KIT BASIN OR (CUSTOM PROCEDURE TRAY) ×1 IMPLANT
KIT TURNOVER KIT B (KITS) ×1 IMPLANT
MANIFOLD NEPTUNE II (INSTRUMENTS) ×1 IMPLANT
NS IRRIG 1000ML POUR BTL (IV SOLUTION) ×1 IMPLANT
PACK ORTHO EXTREMITY (CUSTOM PROCEDURE TRAY) ×1 IMPLANT
PAD ARMBOARD 7.5X6 YLW CONV (MISCELLANEOUS) ×1 IMPLANT
PREVENA RESTOR ARTHOFORM 46X30 (CANNISTER) ×1 IMPLANT
SPONGE T-LAP 18X18 ~~LOC~~+RFID (SPONGE) IMPLANT
STAPLER VISISTAT 35W (STAPLE) IMPLANT
STOCKINETTE IMPERVIOUS LG (DRAPES) ×1 IMPLANT
SUT ETHILON 2 0 PSLX (SUTURE) IMPLANT
SUT SILK 2-0 18XBRD TIE 12 (SUTURE) ×1 IMPLANT
SUT VIC AB 1 CTX 27 (SUTURE) ×2 IMPLANT
TOWEL GREEN STERILE (TOWEL DISPOSABLE) ×1 IMPLANT
TUBE CONNECTING 12X1/4 (SUCTIONS) ×1 IMPLANT
YANKAUER SUCT BULB TIP NO VENT (SUCTIONS) ×1 IMPLANT

## 2023-08-14 NOTE — OR Nursing (Signed)
The patient received of fentanyl by Dr. Chaney Malling on induction of his surgical procedure. After wasting on the computer with a fellow CRNA, we wasted the appropriate amount ( ) of fentanyl but misinterpreted the "remaining quantity" and thus wasted 50 mcg more of fentanyl. I contacted OR Pharmacy [facility] and we felt it best to write up the situation in a patient note. Earl Calderon

## 2023-08-14 NOTE — Anesthesia Preprocedure Evaluation (Addendum)
Anesthesia Evaluation  Patient identified by MRN, date of birth, ID band Patient awake    Reviewed: Allergy & Precautions, H&P , NPO status , Patient's Chart, lab work & pertinent test results  Airway Mallampati: II   Neck ROM: full    Dental   Pulmonary neg pulmonary ROS   breath sounds clear to auscultation       Cardiovascular hypertension, + CAD and +CHF  + dysrhythmias Ventricular Tachycardia  Rhythm:regular Rate:Normal  TTE (08/11/2023): EF 60%   Neuro/Psych    GI/Hepatic   Endo/Other  diabetes, Type 2    Renal/GU Renal InsufficiencyRenal disease     Musculoskeletal   Abdominal   Peds  Hematology   Anesthesia Other Findings   Reproductive/Obstetrics                             Anesthesia Physical Anesthesia Plan  ASA: 3  Anesthesia Plan: General   Post-op Pain Management:    Induction: Intravenous  PONV Risk Score and Plan: 2 and Ondansetron, Dexamethasone, Midazolam and Treatment may vary due to age or medical condition  Airway Management Planned: Oral ETT  Additional Equipment:   Intra-op Plan:   Post-operative Plan: Extubation in OR  Informed Consent: I have reviewed the patients History and Physical, chart, labs and discussed the procedure including the risks, benefits and alternatives for the proposed anesthesia with the patient or authorized representative who has indicated his/her understanding and acceptance.     Dental advisory given  Plan Discussed with: CRNA, Anesthesiologist and Surgeon  Anesthesia Plan Comments:        Anesthesia Quick Evaluation

## 2023-08-14 NOTE — Transfer of Care (Signed)
Immediate Anesthesia Transfer of Care Note  Patient: Earl Calderon  Procedure(s) Performed: BILATERAL BELOW KNEE AMPUTATION (Bilateral: Knee)  Patient Location: PACU  Anesthesia Type:General  Level of Consciousness: drowsy and patient cooperative  Airway & Oxygen Therapy: Patient Spontanous Breathing  Post-op Assessment: Report given to RN, Post -op Vital signs reviewed and stable, and Patient moving all extremities X 4  Post vital signs: Reviewed and stable  Last Vitals:  Vitals Value Taken Time  BP 161/63 08/14/23 1038  Temp 36.6 C 08/14/23 1038  Pulse 65 08/14/23 1042  Resp 15 08/14/23 1042  SpO2 95 % 08/14/23 1042  Vitals shown include unfiled device data.  Last Pain:  Vitals:   08/14/23 0829  TempSrc:   PainSc: 0-No pain      Patients Stated Pain Goal: 2 (08/14/23 0829)  Complications: No notable events documented.

## 2023-08-14 NOTE — Progress Notes (Signed)
[facility]                                                                              Earl Calderon, is a 72 y.o. male, DOB - 03-Jan-1951, FAO:[id number] Admit date - 08/07/2023    Outpatient Primary MD for the patient is [name], [name], MD  LOS - 7  days  Chief Complaint  Patient presents with   Chest Pain       Brief summary   Patient is a 72 year old male with CAD, CKD stage III, DM type II, uncontrolled, MGUS, chronic systolic CHF, NSVT, resistant hypertension, hyperlipidemia presented to ED with shortness of breath.  Per admission history, patient has been complaining of progressive shortness of breath in the last 2 weeks, generalized anterior chest pain with efforts of breathing, no fevers, cough or leg swelling. He also reported chronic leg wounds and have been worsening for the last couple of weeks, more painful. In ED, patient was noted to be hypoxic on attempts at ambulation.  08/10/2023: Underwent aortogram with bilateral lower extremity angiogram 12/11- seen by Dr. Lajoyce Corners-- b/l amputation recommended 12/13- OR for amputations   Assessment & Plan       Acute respiratory failure with hypoxia (HCC) -Patient had presented initially for shortness of breath and hypoxia -Unclear etiology, CTA chest showed no evidence of pulmonary embolus, aortic atherosclerosis, coronary artery calcifications.  No pleural effusion or pneumothorax. -2D echo showed EF of 55 to 60%, no regional WMA, G2 DD -Stable, O2 sats 99% on room air   Bilateral lower extremity wounds, gangrene, chronic limb ischemia - Foul-smelling ulcers on examination - MRI right foot showed soft tissue ulceration along the medial aspect of first MTP joint, no osteomyelitis or abscess, nonspecific marrow edema within the second metatarsal head with questionable underlying linear subchondral low signal, could reflect [place] infarction or stress fracture -MRI left foot showed soft tissue  ulceration along the plantar aspect of the hindfoot with underlying subcutaneous edema, soft tissue emphysema consistent with soft tissue infection, possible necrotizing infection.  HSN mild calcaneal marrow T2 hyperintensity in the calcaneal tuberosity could represent hyperemia or early osteomyelitis -Vascular surgery following, underwent aortogram with bilateral lower extremity angiogram.  Prior vascular surgery, inline flow to the foot without flow-limiting stenosis bilaterally, microvascular disease, remains high risk for amputation.  No intervention to offer from a vascular surgery perspective, recommended wound care and orthopedics consult for debridement versus amputation. -Continue IV Rocephin.  Vancomycin, Zosyn discontinued due to AKI -Orthopedics consulted- recommending amputations- OR on 12/13  Acute kidney injury on CKD stage IIIa -Baseline creatinine 1.2,  presented with creatinine of 1.78, lactic acidosis -Creatinine trending up -Possibly due to contrast nephropathy from CTA, vancomycin, infection/bilateral lower extremity wounds, meds -Entresto, Aldactone, furosemide held since admission. -Placed on IV fluid hydration, renal ultrasound showed medical renal disease, no hydronephrosis or stone, -Nephrology consulted- Cr trending down  Acute metabolic encephalopathy, likely has underlying dementia -May have underlying dementia.  CT head in 04/2022 had shown atrophy with chronic microvascular disease -UA showed> 500 glucose, small Hgb, otherwise negative for UTI -CT head showed no acute intracranial abnormality -B12 3571, folate  12.1, TSH 2.4, B1 pending     Chronic combined systolic and diastolic heart failure (HCC) -previous echo 12/2022 had shown EF of 45 to 50% with G1 DD -BNP 200.5, -2D echo showed EF of 55 to 60%, no regional WMA, G2 DD     Type 2 diabetes mellitus with chronic kidney disease, with long-term current use of insulin (HCC) -Uncontrolled with  hyperglycemia -Hemoglobin A1c 9.1 -Continue sliding scale insulin, sensitive while inpatient  Prolonged QTc -EKG on admission shows QTc of 522 -Avoid QT prolonging meds     MGUS (monoclonal gammopathy of unknown significance) -Outpatient follow with Dr. [name], last seen on 12/05/22  Estimated body mass index is 22.15 kg/m as calculated from the following:   Height as of this encounter: 5\' 9"  (1.753 m).   Weight as of this encounter: 68 kg.      Code Status: Full code DVT Prophylaxis:  enoxaparin (LOVENOX) injection 30 mg Start: 08/09/23 1000 SCDs Start: 08/07/23 2043   Level of Care: Level of care: Telemetry Medical Family Communication: spoke with wife 12/12 about recommendations for amputations  Disposition Plan:      Remains inpatient appropriate   Procedures:  08/10/2023 Ultrasound-guided access of left common femoral artery Second order cannulation of right external iliac Aortogram Bilateral lower extremity angiogram Mynx closure of left common femoral artery access 5 minutes moderate sedation   Consultants:   Vascular surgery Nephrology Orthopedics Palliative care      Medications  [MAR Hold] aspirin EC  81 mg Oral Daily   [MAR Hold] enoxaparin (LOVENOX) injection  30 mg Subcutaneous Q24H   [MAR Hold] insulin aspart  0-6 Units Subcutaneous TID WC   [MAR Hold] insulin detemir  8 Units Subcutaneous BID   [MAR Hold] lidocaine  1 patch Transdermal Q24H   [MAR Hold] sodium chloride flush  3 mL Intravenous Q12H   tranexamic acid (CYKLOKAPRON) 2,000 mg in sodium chloride 0.9 % 50 mL Topical Application  2,000 mg Topical To OR      Subjective:   Went to the OR  Objective:   Vitals:   08/14/23 0736 08/14/23 0813 08/14/23 1038 08/14/23 1045  BP: (!) 159/70 (!) 167/78 (!) 161/63 (!) 115/101  Pulse: 60 65 66 63  Resp: (!) 29 17 16 12   Temp: 98.2 F (36.8 C) 98.5 F (36.9 C) 97.8 F (36.6 C)   TempSrc: Oral Oral    SpO2: 94% 96% 97% 94%  Weight:   68 kg    Height:  5\' 9"  (1.753 m)      Intake/Output Summary (Last 24 hours) at 08/14/2023 1056 Last data filed at 08/14/2023 1025 Gross per 24 hour  Intake 1420 ml  Output 1050 ml  Net 370 ml     Wt Readings from Last 3 Encounters:  08/14/23 68 kg  03/12/23 65 kg  12/29/22 66.1 kg     S/p anesthesia    Data Reviewed:  I have personally reviewed following labs    CBC Lab Results  Component Value Date   WBC 8.4 08/14/2023   RBC 2.66 (L) 08/14/2023   HGB 8.1 (L) 08/14/2023   HCT 24.7 (L) 08/14/2023   MCV 92.9 08/14/2023   MCH 30.5 08/14/2023   PLT 371 08/14/2023   MCHC 32.8 08/14/2023   RDW 12.8 08/14/2023   LYMPHSABS 1.1 12/25/2022   MONOABS 0.4 12/25/2022   EOSABS 0.1 12/25/2022   BASOSABS 0.0 12/25/2022     Last metabolic panel Lab Results  Component Value Date  NA 139 08/14/2023   K 3.6 08/14/2023   CL 108 08/14/2023   CO2 24 08/14/2023   BUN 40 (H) 08/14/2023   CREATININE 3.40 (H) 08/14/2023   GLUCOSE 218 (H) 08/14/2023   GFRNONAA 19 (L) 08/14/2023   GFRAA 78 09/26/2020   CALCIUM 7.7 (L) 08/14/2023   PHOS 2.7 11/11/2018   PROT 6.6 08/08/2023   ALBUMIN 1.9 (L) 08/08/2023   LABGLOB 4.0 (H) 12/25/2022   AGRATIO 0.9 12/25/2022   BILITOT 0.9 08/08/2023   ALKPHOS 59 08/08/2023   AST 14 (L) 08/08/2023   ALT 10 08/08/2023   ANIONGAP 7 08/14/2023    CBG (last 3)  Recent Labs    08/14/23 0545 08/14/23 0735 08/14/23 1038  GLUCAP 189* 161* 159*      Coagulation Profile: Recent Labs  Lab 08/08/23 1823  INR 1.2     Radiology Studies: I have personally reviewed the imaging studies  No results found.     Joseph Art DO [facility] 08/14/2023, 10:56 AM  Available via Epic secure chat 7am-7pm After 7 pm, please refer to night coverage provider listed on amion.

## 2023-08-14 NOTE — Progress Notes (Signed)
Nephrology Follow-Up Consult note   Assessment/Recommendations: Earl Calderon is a/an 72 y.o. male with a past medical history significant for CAD, CKD 3B, DM2, HFrEF (30-35% previously recently improved), HTN, HLD who present w/ hypoxia and foot wounds now c/b AKI    AKI on CKD 3a: BL around 1.3 but hard to say definitively. Unclear cause of AKI but contrast exposure contributing and acting like ATN possibly with some low BP on day of admission and possibly low BP at home. AIN or toxicity associated with vanc is also possible.  Creatinine is greatly improved 2 days in a row with supportive care -Given the patient's improvement in creatinine we will sign off at this time -I will set up follow-up in the office with me -Oral hydration -monitor UOP closely -Continue to monitor daily Cr, Dose meds for GFR -Monitor Daily I/Os, Daily weight  -Maintain MAP>65 for optimal renal perfusion.  -Avoid nephrotoxic medications including NSAIDs -Use synthetic opioids (Fentanyl/Dilaudid) if needed -Check Renal U/S to rule out obstruction -Currently no indication for HD but monitor closely   PAD/lower extremity wounds: s/p arteriogram with VVS 12/9.  Plan for bilateral BKA today   Acute hypoxic respiratory failure: improved with supplemental O2.  Management per primary.   HFrEF: does not appear overloaded. Echo with normal ejection fraction.    Hypertension: Blood pressure fluctuating at times.  Continue to monitor closely.   Uncontrolled Diabetes Mellitus Type 2 with Hyperglycemia: Management per primary   Anemia: Likely multifactorial.  Avoiding iron given lower extremity wounds.  Management per primary   Recommendations conveyed to primary service.    Darnell Level [facility] 08/14/2023 9:48 AM  ___________________________________________________________  CC: Lower extremity wounds  Interval History/Subjective: Patient tearful today but otherwise denies any  complaints.  Planning for surgery today.  Creatinine much improved down to 3.4   Medications:  Current Facility-Administered Medications  Medication Dose Route Frequency Provider Last Rate Last Admin   0.9 %  sodium chloride infusion   Intravenous Continuous Achille Rich, MD 10 mL/hr at 08/14/23 0929 Continued from Pre-op at 08/14/23 0929   0.9 % irrigation (POUR BTL)    PRN Nadara Mustard, MD   1,000 mL at 08/14/23 0854   [MAR Hold] acetaminophen (TYLENOL) tablet 650 mg  650 mg Oral Q6H PRN Nolberto Hanlon, MD   650 mg at 08/12/23 0819   Or   [MAR Hold] acetaminophen (TYLENOL) suppository 650 mg  650 mg Rectal Q6H PRN Nolberto Hanlon, MD       [MAR Hold] aspirin EC tablet 81 mg  81 mg Oral Daily Nolberto Hanlon, MD   81 mg at 08/13/23 0934   ceFAZolin (ANCEF) IVPB 2g/100 mL premix  2 g Intravenous On Call to OR Nadara Mustard, MD   2 g at 08/14/23 0942   [MAR Hold] cefTRIAXone (ROCEPHIN) 1 g in sodium chloride 0.9 % 100 mL IVPB  1 g Intravenous Daily Rai, Ripudeep K, MD   Stopped at 08/13/23 1045   [MAR Hold] enoxaparin (LOVENOX) injection 30 mg  30 mg Subcutaneous Q24H Rai, Ripudeep K, MD   30 mg at 08/13/23 0934   [MAR Hold] hydrALAZINE (APRESOLINE) tablet 25 mg  25 mg Oral Q6H PRN Opyd, Lavone Neri, MD       [MAR Hold] insulin aspart (novoLOG) injection 0-6 Units  0-6 Units Subcutaneous TID WC Rai, Ripudeep K, MD   3 Units at 08/13/23 1207   [facility] Hold] insulin detemir (LEVEMIR) injection 8 Units  8  Units Subcutaneous BID Nolberto Hanlon, MD   8 Units at 08/13/23 2322   [MAR Hold] lidocaine (LIDODERM) 5 % 1 patch  1 patch Transdermal Q24H Daria Pastures, MD   1 patch at 08/13/23 1334   [MAR Hold] LORazepam (ATIVAN) tablet 1 mg  1 mg Oral Q8H PRN Rai, Ripudeep K, MD       Or   [facility] Hold] LORazepam (ATIVAN) injection 1 mg  1 mg Intramuscular Q8H PRN Rai, Delene Ruffini, MD       [MAR Hold] ondansetron (ZOFRAN) injection 4 mg  4 mg Intravenous Q6H PRN Marlin Canary U, DO   4 mg at 08/12/23 1546   [MAR Hold]  polyethylene glycol (MIRALAX / GLYCOLAX) packet 17 g  17 g Oral Daily PRN Nolberto Hanlon, MD       [facility] Hold] sodium chloride flush (NS) 0.9 % injection 3 mL  3 mL Intravenous Q12H Nolberto Hanlon, MD   3 mL at 08/13/23 2323   tranexamic acid (CYKLOKAPRON) 2,000 mg in sodium chloride 0.9 % 50 mL Topical Application  2,000 mg Topical To OR Nadara Mustard, MD       tranexamic acid (CYKLOKAPRON) IVPB 1,000 mg  1,000 mg Intravenous To OR Nadara Mustard, MD   1,000 mg at 08/14/23 1610   Facility-Administered Medications Ordered in Other Encounters  Medication Dose Route Frequency Provider Last Rate Last Admin   dexamethasone (DECADRON) injection   Intravenous Anesthesia Intra-op Muqtasid, Samantha T, CRNA   10 mg at 08/14/23 0942   fentaNYL citrate (PF) (SUBLIMAZE) injection   Intravenous Anesthesia Intra-op Muqtasid, Lelon Mast T, CRNA   150 mcg at 08/14/23 0936   lidocaine 2% (20 mg/mL) 5 mL syringe   Intravenous Anesthesia Intra-op Muqtasid, Samantha T, CRNA   60 mg at 08/14/23 0936   ondansetron (ZOFRAN) injection   Intravenous Anesthesia Intra-op Muqtasid, Samantha T, CRNA   4 mg at 08/14/23 0942   propofol (DIPRIVAN) 10 mg/mL bolus/IV push   Intravenous Anesthesia Intra-op MuqtasidLelon Mast T, CRNA   160 mg at 08/14/23 0936   rocuronium (ZEMURON) injection   Intravenous Anesthesia Intra-op Muqtasid, Samantha T, CRNA   50 mg at 08/14/23 9604      Review of Systems: 10 systems reviewed and negative except per interval history/subjective  Physical Exam: Vitals:   08/14/23 0736 08/14/23 0813  BP: (!) 159/70 (!) 167/78  Pulse: 60 65  Resp: (!) 29 17  Temp: 98.2 F (36.8 C) 98.5 F (36.9 C)  SpO2: 94% 96%   Total I/O In: 200 [IV Piggyback:200] Out: 150 [Urine:150]  Intake/Output Summary (Last 24 hours) at 08/14/2023 0948 Last data filed at 08/14/2023 5409 Gross per 24 hour  Intake 920 ml  Output 875 ml  Net 45 ml   Constitutional: Lying in bed, no distress ENMT: ears and nose without  scars or lesions, MMM CV: normal rate, no edema Respiratory: Bilateral chest rise, normal work of breathing Gastrointestinal: soft, non-tender, no palpable masses or hernias Skin: Chronic skin changes on the bilateral feet with wounds present, otherwise no rashes Psych: alert, judgement/insight appropriate, depressed mood and normal affect   Test Results I personally reviewed new and old clinical labs and radiology tests Lab Results  Component Value Date   NA 139 08/14/2023   K 3.6 08/14/2023   CL 108 08/14/2023   CO2 24 08/14/2023   BUN 40 (H) 08/14/2023   CREATININE 3.40 (H) 08/14/2023   CALCIUM 7.7 (L) 08/14/2023   ALBUMIN 1.9 (L)  08/08/2023   PHOS 2.7 11/11/2018    CBC Recent Labs  Lab 08/12/23 0334 08/13/23 0250 08/14/23 0344  WBC 10.8* 9.8 8.4  HGB 7.9* 8.1* 8.1*  HCT 24.1* 25.1* 24.7*  MCV 93.1 93.7 92.9  PLT 306 357 371

## 2023-08-14 NOTE — Progress Notes (Signed)
Patient's wife/sister have called unit multiple times with verbally abusive language and loud tones.  All calls have been blocked to unit.  Patient is still able to field calls to the room and is able to make outbound calls.  Patient states understanding and thanks staff.

## 2023-08-14 NOTE — Interval H&P Note (Signed)
History and Physical Interval Note:  08/14/2023 6:50 AM  Earl Calderon  has presented today for surgery, with the diagnosis of Gangrene Bilateral Feet.  The various methods of treatment have been discussed with the patient and family. After consideration of risks, benefits and other options for treatment, the patient has consented to  Procedure(s): BILATERAL BELOW KNEE AMPUTATION (Bilateral) as a surgical intervention.  The patient's history has been reviewed, patient examined, no change in status, stable for surgery.  I have reviewed the patient's chart and labs.  Questions were answered to the patient's satisfaction.     Nadara Mustard

## 2023-08-14 NOTE — Op Note (Signed)
08/14/2023  10:40 AM  PATIENT:  Earl Calderon    PRE-OPERATIVE DIAGNOSIS:  Gangrene Bilateral Feet  POST-OPERATIVE DIAGNOSIS:  Same  PROCEDURE:  BILATERAL BELOW KNEE AMPUTATION Application of Kerecis micro graft 38 cm x 2 Application of Prevena customizable wound VAC dressings x 2 Application of Vive Wear stump shrinker x 2  SURGEON:  Nadara Mustard, MD  ANESTHESIA:   General  PREOPERATIVE INDICATIONS:  Earl Calderon is a  72 y.o. male with a diagnosis of Gangrene Bilateral Feet who failed conservative measures and elected for surgical management.    The risks benefits and alternatives were discussed with the patient preoperatively including but not limited to the risks of infection, bleeding, nerve injury, cardiopulmonary complications, the need for revision surgery, among others, and the patient was willing to proceed.  OPERATIVE IMPLANTS:   Implant Name Type Inv. Item Serial No. Manufacturer Lot No. LRB No. Used Action  GRAFT SKIN WND MICRO 38 - [id number] Tissue GRAFT SKIN WND MICRO 38  [employer] [id number] Right 1 Implanted  GRAFT SKIN WND MICRO 38 - [id number] Tissue GRAFT SKIN WND MICRO 38  [employer] [id number] Left 1 Implanted     OPERATIVE FINDINGS: Muscle was viable.  There was no abscess.  There was not good color contractility or consistency of the muscle.  Vessels were extremely calcified.  OPERATIVE PROCEDURE: Patient was brought to the operating room after undergoing a regional anesthetic.  After adequate levels anesthesia were obtained a thigh tourniquet was placed and the lower extremity was prepped using DuraPrep draped into a sterile field. The foot was draped out of the sterile field with impervious stockinette.   Attention was first focused on the right lower extremity.  A timeout was called and the tourniquet inflated.  A transverse skin incision was made 12 cm distal to the tibial tubercle, the incision curved proximally, and a large  posterior flap was created.  The tibia was transected just proximal to the skin incision and beveled anteriorly.  The fibula was transected just proximal to the tibial incision.  The sciatic nerve was pulled cut and allowed to retract.  The vascular bundles were suture ligated with 2-0 silk.  The tourniquet was deflated and hemostasis obtained.      The Kerecis micro powder 38 cm was applied to the open wound that has a 200 cm surface area. The deep and superficial fascial layers were closed using #1 Vicryl.  The skin was closed using staples.    The Prevena customizable dressing was applied.  Earl Calderon was used to secure the sponges and the circumferential compression was secured to the skin with Dermatac.  This was connected to the wound VAC pump and had a good suction fit this was covered with a stump shrinker.    Attention was then focused on the left lower extremity.  A transverse skin incision was made 12 cm distal to the tibial tubercle, the incision curved proximally, and a large posterior flap was created.  The tibia was transected just proximal to the skin incision and beveled anteriorly.  The fibula was transected just proximal to the tibial incision.  The sciatic nerve was pulled cut and allowed to retract.  The vascular bundles were suture ligated with 2-0 silk.  The tourniquet was deflated and hemostasis obtained.    The Kerecis micro powder 38 cm was applied to the open wound that has a 200 cm surface area. The deep and superficial fascial layers were closed using #1 Vicryl.  The  skin was closed using staples.    The Prevena customizable dressing was applied.  Earl Calderon was used to secure the sponges and the circumferential compression was secured to the skin with Dermatac.  This was connected to the wound VAC pump and had a good suction fit this was covered with a stump shrinker.    Patient was taken to the PACU in stable condition.   DISCHARGE PLANNING:  Antibiotic duration:  24-hour antibiotics  Weightbearing: Nonweightbearing on the operative extremity  Pain medication: Opioid pathway  Dressing care/ Wound VAC: Continue wound VAC with the Prevena plus pump at discharge for 1 week  Ambulatory devices: Walker or kneeling scooter  Discharge to: Discharge planning based on recommendations per physical therapy  Follow-up: In the office 1 week after discharge.

## 2023-08-14 NOTE — Anesthesia Procedure Notes (Signed)
Procedure Name: Intubation Date/Time: 08/14/2023 9:46 AM  Performed by: Alease Medina, CRNAPre-anesthesia Checklist: Patient identified, Emergency Drugs available, Suction available and Patient being monitored Patient Re-evaluated:Patient Re-evaluated prior to induction Oxygen Delivery Method: Circle system utilized Preoxygenation: Pre-oxygenation with 100% oxygen Induction Type: IV induction Ventilation: Mask ventilation without difficulty Laryngoscope Size: Mac and 4 Grade View: Grade II Tube type: Oral Tube size: 7.5 mm Number of attempts: 1 Airway Equipment and Method: Stylet Placement Confirmation: ETT inserted through vocal cords under direct vision, positive ETCO2 and breath sounds checked- equal and bilateral Secured at: 22 cm Tube secured with: Tape Dental Injury: Teeth and Oropharynx as per pre-operative assessment

## 2023-08-14 NOTE — Plan of Care (Signed)
  Problem: Tissue Perfusion: Goal: Adequacy of tissue perfusion will improve Outcome: Not Progressing   Problem: Elimination: Goal: Will not experience complications related to bowel motility Outcome: Not Progressing   Problem: Pain Management: Goal: General experience of comfort will improve Outcome: Progressing

## 2023-08-14 NOTE — Progress Notes (Signed)
     Referral received for Earl Calderon: goals of care discussion. Reached out to provider. Patient is currently in OR for BKA. PMT not needed at this time. They will reconsult PMT if needs arise.    [facility] Palliative Medicine Team  Team Phone # [phone]   NO CHARGE

## 2023-08-15 DIAGNOSIS — J9601 Acute respiratory failure with hypoxia: Secondary | ICD-10-CM | POA: Diagnosis not present

## 2023-08-15 LAB — GLUCOSE, CAPILLARY
Glucose-Capillary: 375 mg/dL — ABNORMAL HIGH (ref 70–99)
Glucose-Capillary: 273 mg/dL — ABNORMAL HIGH (ref 70–99)
Glucose-Capillary: 289 mg/dL — ABNORMAL HIGH (ref 70–99)
Glucose-Capillary: 294 mg/dL — ABNORMAL HIGH (ref 70–99)

## 2023-08-15 NOTE — Evaluation (Signed)
Physical Therapy Evaluation Patient Details Name: Earl Calderon MRN: [id number] DOB: Sep 29, 1950 Today's Date: 08/15/2023  History of Present Illness  Patient is a 72 yo male presenting to the ED with SOB and fatigue on 08/07/23. Admitted with acute respiratory failure with hypoxia. Found to have bilateral lower extremity wounds, gangrene, chronic limb ischemia. Arteriogram performed on 12/9. 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  PMH includes: CAD, CKD stage III, DM type II, uncontrolled, MGUS, chronic systolic CHF, NSVT, resistant hypertension, hyperlipidemia  Clinical Impression  Pt is presenting below baseline level of functioning. Prior to hospitalization pt was ind with all activities. Currently pt is CGA for bed mobility and unable to progress out of bed without a second person for safety. Due to pt current functional status, home set up and available assistance at home recommending skilled physical therapy services < 3 hours/day in order to address strength, balance and functional mobility to decrease risk for falls, injury, immobility, skin break down and re-hospitalization. Pt and family would like for pt to go home. Pt would require W/C, hoyer lift, drop arm bedside commode 24/7 physical assist and ramp to get to home.        If plan is discharge home, recommend the following: Two people to help with walking and/or transfers;Assistance with cooking/housework;Assist for transportation;Help with stairs or ramp for entrance;Supervision due to cognitive status   Can travel by private vehicle   No    Equipment Recommendations Wheelchair (measurements PT);Wheelchair cushion (measurements PT);Hoyer lift;Other (comment) (ramp, possibly drop arm BSC)  Recommendations for Other Services       Functional Status Assessment Patient has had a recent decline in their functional status and demonstrates the ability to make significant improvements in function in a reasonable and predictable amount of  time.     Precautions / Restrictions Precautions Precautions: Fall Restrictions Weight Bearing Restrictions Per Provider Order: No Other Position/Activity Restrictions: Bilateral BKA      Mobility  Bed Mobility Overal bed mobility: Needs Assistance Bed Mobility: Supine to Sit, Sit to Supine     Supine to sit: Contact guard, Used rails, HOB elevated Sit to supine: Contact guard assist, Used rails, HOB elevated   General bed mobility comments: Pt needed some encouragement and cueing from physical therapist and spouse to get sitting EOB.    Transfers     General transfer comment: did not transfer out of bed at this time. Due to no second person for assist.       Balance Overall balance assessment: Needs assistance Sitting-balance support: Bilateral upper extremity supported, Feet unsupported Sitting balance-Leahy Scale: Poor Sitting balance - Comments: intermittent Min A sitting EOB Postural control: Posterior lean     Standing balance comment: unable         Pertinent Vitals/Pain Pain Assessment Pain Assessment: No/denies pain    Home Living Family/patient expects to be discharged to:: Private residence Living Arrangements: Spouse/significant other Available Help at Discharge: Family;Available 24 hours/day Type of Home: House Home Access: Stairs to enter Entrance Stairs-Rails: Can reach both Entrance Stairs-Number of Steps: 3   Home Layout: One level Home Equipment: None      Prior Function Prior Level of Function : Independent/Modified Independent             Mobility Comments: independent ADLs Comments: independent with time, does not drive     Extremity/Trunk Assessment   Upper Extremity Assessment Upper Extremity Assessment: Defer to OT evaluation    Lower Extremity Assessment Lower Extremity Assessment: RLE deficits/detail;LLE deficits/detail  RLE Deficits / Details: BKA LLE Deficits / Details: BKA    Cervical / Trunk  Assessment Cervical / Trunk Assessment: Normal  Communication   Communication Communication: No apparent difficulties  Cognition Arousal: Alert Behavior During Therapy: WFL for tasks assessed/performed Overall Cognitive Status: Difficult to assess       General Comments: Patient oriented, but tangential, per chart may have some baseline cognitive deficits. Pt states he has legs multiple times during session and he "might stand up"        General Comments General comments (skin integrity, edema, etc.): wound vacs intact bil. Spouse present throughout session and encouraging.        Assessment/Plan    PT Assessment Patient needs continued PT services  PT Problem List Decreased strength;Decreased mobility;Decreased activity tolerance;Decreased balance;Decreased safety awareness       PT Treatment Interventions DME instruction;Therapeutic exercise;Balance training;Functional mobility training;Therapeutic activities;Patient/family education;Wheelchair mobility training    PT Goals (Current goals can be found in the Care Plan section)  Acute Rehab PT Goals Patient Stated Goal: Would like to go home with family PT Goal Formulation: With patient Time For Goal Achievement: 08/29/23 Potential to Achieve Goals: Fair    Frequency Min 1X/week        AM-PAC PT "6 Clicks" Mobility  Outcome Measure Help needed turning from your back to your side while in a flat bed without using bedrails?: A Little Help needed moving from lying on your back to sitting on the side of a flat bed without using bedrails?: A Little Help needed moving to and from a bed to a chair (including a wheelchair)?: A Lot Help needed standing up from a chair using your arms (e.g., wheelchair or bedside chair)?: Total Help needed to walk in hospital room?: Total Help needed climbing 3-5 steps with a railing? : Total 6 Click Score: 11    End of Session   Activity Tolerance: Patient tolerated treatment  well Patient left: in bed;with call bell/phone within reach;with family/visitor present Nurse Communication: Mobility status PT Visit Diagnosis: Other abnormalities of gait and mobility (R26.89)    Time: 8295-6213 PT Time Calculation (min) (ACUTE ONLY): 28 min   Charges:   PT Evaluation $PT Eval Low Complexity: 1 Low PT Treatments $Therapeutic Activity: 8-22 mins PT General Charges $$ ACUTE PT VISIT: 1 Visit        Harrel Carina, DPT, CLT  Acute Rehabilitation Services Office: [phone] (Secure chat preferred)   Claudia Desanctis 08/15/2023, 4:33 PM

## 2023-08-15 NOTE — Progress Notes (Signed)
OT Cancellation Note  Patient Details Name: Earl Calderon MRN: [id number] DOB: May 03, 1951   Cancelled Treatment:    Reason Eval/Treat Not Completed: Patient declined, no reason specified Patient eating upon OT arrival, requesting OT come back. OT will follow back to complete evaluation as time permits.   Pollyann Glen E. Kamarii Carton, OTR/L Acute Rehabilitation Services [phone]   Cherlyn Cushing 08/15/2023, 11:26 AM

## 2023-08-15 NOTE — Progress Notes (Signed)
[facility]                                                                              Earl Calderon, is a 72 y.o. male, DOB - 01-Oct-1950, ZOX:[id number] Admit date - 08/07/2023    Outpatient Primary MD for the patient is Earl Calderon, Earl Moccasin, MD  LOS - 8  days  Chief Complaint  Patient presents with   Chest Pain       Brief summary   Patient is a 72 year old male with CAD, CKD stage III, DM type II, uncontrolled, MGUS, chronic systolic CHF, NSVT, resistant hypertension, hyperlipidemia presented to ED with shortness of breath.  Per admission history, patient has been complaining of progressive shortness of breath in the last 2 weeks, generalized anterior chest pain with efforts of breathing, no fevers, cough or leg swelling. He also reported chronic leg wounds and have been worsening for the last couple of weeks, more painful. In ED, patient was noted to be hypoxic on attempts at ambulation.  08/10/2023: Underwent aortogram with bilateral lower extremity angiogram 12/11- seen by Dr. Lajoyce Corners-- b/l amputation recommended 12/13- OR for amputations   Assessment & Plan       Acute respiratory failure with hypoxia (HCC) -Patient had presented initially for shortness of breath and hypoxia -Unclear etiology, CTA chest showed no evidence of pulmonary embolus, aortic atherosclerosis, coronary artery calcifications.  No pleural effusion or pneumothorax. -2D echo showed EF of 55 to 60%, no regional WMA, G2 DD -Stable, O2 sats 99% on room air   Bilateral lower extremity wounds, gangrene, chronic limb ischemia - Foul-smelling ulcers on examination - MRI right foot showed soft tissue ulceration along the medial aspect of first MTP joint, no osteomyelitis or abscess, nonspecific marrow edema within the second metatarsal head with questionable underlying linear subchondral low signal, could reflect [place] infarction or stress fracture -MRI left foot showed soft tissue  ulceration along the plantar aspect of the hindfoot with underlying subcutaneous edema, soft tissue emphysema consistent with soft tissue infection, possible necrotizing infection.  HSN mild calcaneal marrow T2 hyperintensity in the calcaneal tuberosity could represent hyperemia or early osteomyelitis -Vascular surgery following, underwent aortogram with bilateral lower extremity angiogram.  Prior vascular surgery, inline flow to the foot without flow-limiting stenosis bilaterally, microvascular disease, remains high risk for amputation.  No intervention to offer from a vascular surgery perspective, recommended wound care and orthopedics consult for debridement versus amputation. -Continue IV Rocephin 24 hours post op.  Vancomycin, Zosyn discontinued due to AKI -Orthopedics consulted- recommending amputations- OR on 12/13 -PT eval  Acute kidney injury on CKD stage IIIa -Baseline creatinine 1.2,  presented with creatinine of 1.78, lactic acidosis -Creatinine trending up -Possibly due to contrast nephropathy from CTA, vancomycin, infection/bilateral lower extremity wounds, meds -Entresto, Aldactone, furosemide held since admission. -Placed on IV fluid hydration, renal ultrasound showed medical renal disease, no hydronephrosis or stone, -Nephrology consulted- Cr trending down -labs in AM  Acute metabolic encephalopathy, likely has underlying dementia -May have underlying dementia.  CT head in 04/2022 had shown atrophy with chronic microvascular disease -UA showed> 500 glucose, small Hgb, otherwise negative for UTI -CT  head showed no acute intracranial abnormality -B12 3571, folate 12.1, TSH 2.4 -outpatient follow up     Chronic combined systolic and diastolic heart failure (HCC) -previous echo 12/2022 had shown EF of 45 to 50% with G1 DD -BNP 200.5, -2D echo showed EF of 55 to 60%, no regional WMA, G2 DD     Type 2 diabetes mellitus with chronic kidney disease, with long-term current use of  insulin (HCC) -Uncontrolled with hyperglycemia -Hemoglobin A1c 9.1 -Continue sliding scale insulin, sensitive while inpatient  Prolonged QTc -EKG on admission shows QTc of 522 -Avoid QT prolonging meds     MGUS (monoclonal gammopathy of unknown significance) -Outpatient follow with Dr. [name], last seen on 12/05/22  Estimated body mass index is 22.15 kg/m as calculated from the following:   Height as of this encounter: 5\' 9"  (1.753 m).   Weight as of this encounter: 68 kg.      Code Status: Full code DVT Prophylaxis:  SCD's Start: 08/14/23 1124 enoxaparin (LOVENOX) injection 30 mg Start: 08/09/23 1000 SCDs Start: 08/07/23 2043   Level of Care: Level of care: Telemetry Medical Family Communication: LM for wife Disposition Plan:      Remains inpatient appropriate   Procedures:  08/10/2023 Ultrasound-guided access of left common femoral artery Second order cannulation of right external iliac Aortogram Bilateral lower extremity angiogram Mynx closure of left common femoral artery access 5 minutes moderate sedation   Consultants:   Vascular surgery Nephrology Orthopedics       Medications  vitamin C  1,000 mg Oral Daily   aspirin EC  81 mg Oral Daily   enoxaparin (LOVENOX) injection  30 mg Subcutaneous Q24H   insulin aspart  0-6 Units Subcutaneous TID WC   insulin detemir  8 Units Subcutaneous BID   lidocaine  1 patch Transdermal Q24H   nutrition supplement (JUVEN)  1 packet Oral BID BM   sodium chloride flush  3 mL Intravenous Q12H   zinc sulfate (50mg  elemental zinc)  220 mg Oral Daily      Subjective:   No overnight events  Objective:   Vitals:   08/14/23 1900 08/14/23 2308 08/15/23 0813 08/15/23 1221  BP: (!) 147/66 (!) 164/75 (!) 174/71 (!) 140/72  Pulse: 68 65    Resp: 15 15 15 16   Temp: 98.3 F (36.8 C) 98 F (36.7 C)    TempSrc: Oral Oral Axillary   SpO2: 100% 92%    Weight:      Height:        Intake/Output Summary (Last 24  hours) at 08/15/2023 1330 Last data filed at 08/15/2023 0755 Gross per 24 hour  Intake 480 ml  Output 125 ml  Net 355 ml     Wt Readings from Last 3 Encounters:  08/14/23 68 kg  03/12/23 65 kg  12/29/22 66.1 kg      General: Appearance:    Well developed, well nourished male in no acute distress     Lungs:     respirations unlabored  Heart:    Normal heart rate.   MS:   All extremities are intact.   Neurologic:   Flat affect      Data Reviewed:  I have personally reviewed following labs    CBC Lab Results  Component Value Date   WBC 8.4 08/14/2023   RBC 2.66 (L) 08/14/2023   HGB 8.1 (L) 08/14/2023   HCT 24.7 (L) 08/14/2023   MCV 92.9 08/14/2023   MCH 30.5 08/14/2023   PLT  371 08/14/2023   MCHC 32.8 08/14/2023   RDW 12.8 08/14/2023   LYMPHSABS 1.1 12/25/2022   MONOABS 0.4 12/25/2022   EOSABS 0.1 12/25/2022   BASOSABS 0.0 12/25/2022     Last metabolic panel Lab Results  Component Value Date   NA 139 08/14/2023   K 3.6 08/14/2023   CL 108 08/14/2023   CO2 24 08/14/2023   BUN 40 (H) 08/14/2023   CREATININE 3.40 (H) 08/14/2023   GLUCOSE 218 (H) 08/14/2023   GFRNONAA 19 (L) 08/14/2023   GFRAA 78 09/26/2020   CALCIUM 7.7 (L) 08/14/2023   PHOS 2.7 11/11/2018   PROT 6.6 08/08/2023   ALBUMIN 1.9 (L) 08/08/2023   LABGLOB 4.0 (H) 12/25/2022   AGRATIO 0.9 12/25/2022   BILITOT 0.9 08/08/2023   ALKPHOS 59 08/08/2023   AST 14 (L) 08/08/2023   ALT 10 08/08/2023   ANIONGAP 7 08/14/2023    CBG (last 3)  Recent Labs    08/14/23 2110 08/15/23 0755 08/15/23 1219  GLUCAP 483* 375* 273*      Coagulation Profile: Recent Labs  Lab 08/08/23 1823  INR 1.2     Radiology Studies: I have personally reviewed the imaging studies  No results found.     Joseph Art DO [facility] 08/15/2023, 1:30 PM  Available via Epic secure chat 7am-7pm After 7 pm, please refer to night coverage provider listed on amion.

## 2023-08-15 NOTE — Evaluation (Signed)
Occupational Therapy Evaluation Patient Details Name: Earl Calderon MRN: [id number] DOB: 1950/09/27 Today's Date: 08/15/2023   History of Present Illness Patient is a 72 yo male presenting to the ED with SOB and fatigue on 08/07/23. Admitted with acute respiratory failure with hypoxia. Found to have bilateral lower extremity wounds, gangrene, chronic limb ischemia. Arteriogram performed on 12/9. 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  PMH includes: CAD, CKD stage III, DM type II, uncontrolled, MGUS, chronic systolic CHF, NSVT, resistant hypertension, hyperlipidemia   Clinical Impression   Prior to this admission, patient living with his spouse, and fully independent without AD but no longer drove. Currently, patient presenting status post bilateral BKA on 12/13. Patient minimally participatory with OT, and declining OOB or EOB in session to date. Patient tangential when asked about home set up, and need for education x2 that he will not be receiving prosthetics this hospital visit as his legs need to heal. OT recommending therapy at a lesser intensive venue < 3 hours a day in order to return to prior level.      If plan is discharge home, recommend the following: A lot of help with walking and/or transfers;A lot of help with bathing/dressing/bathroom;Assistance with cooking/housework;Direct supervision/assist for medications management;Direct supervision/assist for financial management;Assist for transportation;Help with stairs or ramp for entrance;Supervision due to cognitive status    Functional Status Assessment  Patient has had a recent decline in their functional status and demonstrates the ability to make significant improvements in function in a reasonable and predictable amount of time.  Equipment Recommendations  Other (comment) (defer to next venue)    Recommendations for Other Services       Precautions / Restrictions Precautions Precautions: Fall Restrictions Weight Bearing  Restrictions Per Provider Order: No Other Position/Activity Restrictions: Bilateral BKA      Mobility Bed Mobility               General bed mobility comments: refusing OOB or EOB in session    Transfers                   General transfer comment: refusing transfer this date      Balance Overall balance assessment: Mild deficits observed, not formally tested                                         ADL either performed or assessed with clinical judgement   ADL Overall ADL's : Needs assistance/impaired Eating/Feeding: Set up;Sitting   Grooming: Set up;Sitting   Upper Body Bathing: Minimal assistance;Sitting   Lower Body Bathing: Maximal assistance;Total assistance;Sitting/lateral leans   Upper Body Dressing : Minimal assistance;Sitting   Lower Body Dressing: Maximal assistance;Total assistance;Sit to/from stand;Sitting/lateral leans     Toilet Transfer Details (indicate cue type and reason): patient declining OOB activity Toileting- Clothing Manipulation and Hygiene: Maximal assistance;Bed level       Functional mobility during ADLs: Moderate assistance General ADL Comments: Patient presenting status post bilateral BKA on 12/13. Patient minimally participatory with OT, and declining OOB or EOB in session to date. Patient tangential when asked about home set up, and need for education x2 that he will not be receiving prosthetics this hospital visit as his legs need to heal. OT recommending therapy at a lesser intensive venue < 3 hours a day in order to return to prior level.     Vision Baseline Vision/History: 1 Wears glasses;4  Cataracts Ability to See in Adequate Light: 1 Impaired Patient Visual Report: No change from baseline Vision Assessment?: Wears glasses for reading Additional Comments: In the process of getting cataracts removed     Perception Perception: Not tested       Praxis Praxis: Not tested       Pertinent  Vitals/Pain Pain Assessment Pain Assessment: Faces Pain Score: 0-No pain     Extremity/Trunk Assessment Upper Extremity Assessment Upper Extremity Assessment: Generalized weakness;Right hand dominant   Lower Extremity Assessment Lower Extremity Assessment: Defer to PT evaluation   Cervical / Trunk Assessment Cervical / Trunk Assessment: Normal   Communication Communication Communication: No apparent difficulties   Cognition Arousal: Alert Behavior During Therapy: WFL for tasks assessed/performed Overall Cognitive Status: Difficult to assess                                 General Comments: Patient oriented, but tangential, per chart may have some baseline cognitive deficits. Educated patient twice about how he will not have prosthetics when he leaves the hospital.     General Comments       Exercises     Shoulder Instructions      Home Living Family/patient expects to be discharged to:: Private residence Living Arrangements: Spouse/significant other Available Help at Discharge: Family;Available 24 hours/day Type of Home: House Home Access: Stairs to enter [employer] of Steps: 3 Entrance Stairs-Rails: Can reach both Home Layout: One level     Bathroom Shower/Tub: Producer, television/film/video: Standard     Home Equipment: None          Prior Functioning/Environment Prior Level of Function : Independent/Modified Independent             Mobility Comments: independent ADLs Comments: independent with time, does not drive        OT Problem List: Decreased strength;Decreased range of motion;Decreased activity tolerance;Impaired balance (sitting and/or standing);Decreased coordination;Decreased cognition;Decreased knowledge of use of DME or AE;Decreased safety awareness;Decreased knowledge of precautions      OT Treatment/Interventions: Self-care/ADL training;Therapeutic exercise;Energy conservation;DME and/or AE  instruction;Manual therapy;Therapeutic activities;Cognitive remediation/compensation;Balance training;Patient/family education    OT Goals(Current goals can be found in the care plan section) Acute Rehab OT Goals Patient Stated Goal: to get some sleep OT Goal Formulation: Patient unable to participate in goal setting Time For Goal Achievement: 08/29/23 Potential to Achieve Goals: Fair ADL Goals Pt Will Perform Lower Body Bathing: with min assist;sitting/lateral leans Pt Will Perform Lower Body Dressing: with min assist;sitting/lateral leans Pt Will Transfer to Toilet: with mod assist;with transfer board;bedside commode Pt Will Perform Toileting - Clothing Manipulation and hygiene: with min assist;sitting/lateral leans Additional ADL Goal #1: Patient will complete bed mobility at CGA level as a precursor to OOB activities.  OT Frequency: Min 1X/week    Co-evaluation              AM-PAC OT "6 Clicks" Daily Activity     Outcome Measure Help from another person eating meals?: A Little Help from another person taking care of personal grooming?: A Little Help from another person toileting, which includes using toliet, bedpan, or urinal?: A Lot Help from another person bathing (including washing, rinsing, drying)?: A Lot Help from another person to put on and taking off regular upper body clothing?: A Little Help from another person to put on and taking off regular lower body clothing?: A Lot 6 Click Score: 15  End of Session Nurse Communication: Mobility status;Other (comment) (decreased participation)  Activity Tolerance: Patient limited by lethargy;Patient limited by fatigue Patient left: in bed;with call bell/phone within reach;with family/visitor present  OT Visit Diagnosis: Unsteadiness on feet (R26.81);Other abnormalities of gait and mobility (R26.89);Other symptoms and signs involving cognitive function                Time: 1610-9604 OT Time Calculation (min): 9  min Charges:  OT General Charges $OT Visit: 1 Visit OT Evaluation $OT Eval Low Complexity: 1 Low  Pollyann Glen E. Dyshon Philbin, OTR/L Acute Rehabilitation Services [phone]   Cherlyn Cushing 08/15/2023, 1:33 PM

## 2023-08-15 NOTE — Progress Notes (Signed)
Patient ID: Earl Calderon, male   DOB: 10-29-1950, 72 y.o.   MRN: [id number] Patient is postoperative day 1 bilateral transtibial amputations.  The wound vacs have no drainage.  Plan for progressive activities.  White cell count has decreased to 8.4.  Hemoglobin stable at 8.1.

## 2023-08-16 ENCOUNTER — Encounter (HOSPITAL_COMMUNITY): Payer: Self-pay | Admitting: Orthopedic Surgery

## 2023-08-16 DIAGNOSIS — J9601 Acute respiratory failure with hypoxia: Secondary | ICD-10-CM | POA: Diagnosis not present

## 2023-08-16 LAB — GLUCOSE, CAPILLARY
Glucose-Capillary: 172 mg/dL — ABNORMAL HIGH (ref 70–99)
Glucose-Capillary: 132 mg/dL — ABNORMAL HIGH (ref 70–99)
Glucose-Capillary: 100 mg/dL — ABNORMAL HIGH (ref 70–99)
Glucose-Capillary: 107 mg/dL — ABNORMAL HIGH (ref 70–99)

## 2023-08-16 LAB — CBC
HCT: 19.8 % — ABNORMAL LOW (ref 39.0–52.0)
Hemoglobin: 6.5 g/dL — CL (ref 13.0–17.0)
MCH: 31 pg (ref 26.0–34.0)
MCHC: 32.8 g/dL (ref 30.0–36.0)
MCV: 94.3 fL (ref 80.0–100.0)
Platelets: 372 10*3/uL (ref 150–400)
RBC: 2.1 MIL/uL — ABNORMAL LOW (ref 4.22–5.81)
RDW: 13.5 % (ref 11.5–15.5)
WBC: 9.6 10*3/uL (ref 4.0–10.5)
nRBC: 0.2 % (ref 0.0–0.2)

## 2023-08-16 LAB — BASIC METABOLIC PANEL
Anion gap: 5 (ref 5–15)
BUN: 41 mg/dL — ABNORMAL HIGH (ref 8–23)
CO2: 24 mmol/L (ref 22–32)
Calcium: 7.5 mg/dL — ABNORMAL LOW (ref 8.9–10.3)
Chloride: 104 mmol/L (ref 98–111)
Creatinine, Ser: 2.17 mg/dL — ABNORMAL HIGH (ref 0.61–1.24)
GFR, Estimated: 32 mL/min — ABNORMAL LOW (ref >60)
Glucose, Bld: 229 mg/dL — ABNORMAL HIGH (ref 70–99)
Potassium: 4.3 mmol/L (ref 3.5–5.1)
Sodium: 133 mmol/L — ABNORMAL LOW (ref 135–145)

## 2023-08-16 LAB — HEMOGLOBIN AND HEMATOCRIT, BLOOD
HCT: 22.1 % — ABNORMAL LOW (ref 39.0–52.0)
Hemoglobin: 7.4 g/dL — ABNORMAL LOW (ref 13.0–17.0)

## 2023-08-16 LAB — PREPARE RBC (CROSSMATCH)

## 2023-08-16 MED ORDER — FENTANYL CITRATE PF 50 MCG/ML IJ SOSY
25.0000 ug | PREFILLED_SYRINGE | INTRAMUSCULAR | Status: AC | PRN
  Administered 2023-08-16 – 2023-08-17 (×5): 50 ug via INTRAVENOUS
  Filled 2023-08-16 (×5): qty 1

## 2023-08-16 MED ORDER — SODIUM CHLORIDE 0.9% IV SOLUTION: Freq: Once | INTRAVENOUS | Status: AC

## 2023-08-16 MED ORDER — OXYCODONE HCL 5 MG PO TABS
5.0000 mg | ORAL_TABLET | ORAL | Status: DC | PRN
  Administered 2023-08-16 – 2023-09-01 (×26): 5 mg via ORAL
  Filled 2023-08-16 (×28): qty 1

## 2023-08-16 NOTE — Progress Notes (Signed)
Pts wife has called twice this AM, belligerent and cussing at [employer].  Unit director and AD aware.  Will speak with MD as well.  All calls from wife to be transferred to Pts room.

## 2023-08-16 NOTE — TOC Progression Note (Addendum)
Transition of Care [facility]) - Progression Note    Patient Details  Name: Earl Calderon MRN: [id number] Date of Birth: 1951-05-23  [facility] CM/SW Contact  Nicanor Bake Phone Number: [phone] 08/16/2023, 10:57 AM  Clinical Narrative:  CSW met with pt at bedside. Pt stated that, "he was sleepy and did not want to talk." CSW stated that she was only there to inquire with pt to see if he was interested in SNF. Pt stated, "perhaps" and rolled back over to go to sleep. Pt refused to engage in conversation. CSW will make an attempt to meet with pt at a later time.   TOC will continue following.      Expected Discharge Plan:  (to be determined post op) Barriers to Discharge: Continued Medical Work up  Expected Discharge Plan and Services                                               Social Determinants of Health (SDOH) Interventions SDOH Screenings   Food Insecurity: No Food Insecurity (08/07/2023)  Housing: Low Risk  (08/07/2023)  Transportation Needs: No Transportation Needs (08/07/2023)  Utilities: Not At Risk (08/07/2023)  Depression (PHQ2-9): Low Risk  (10/18/2020)  Financial Resource Strain: Medium Risk (09/21/2020)  Tobacco Use: Low Risk  (08/14/2023)    Readmission Risk Interventions     No data to display

## 2023-08-16 NOTE — Progress Notes (Signed)
[facility]                                                                              Earl Calderon, is a 72 y.o. male, DOB - 01-24-1951, GNF:[id number] Admit date - 08/07/2023    Outpatient Primary MD for the patient is [name], [name], MD  LOS - 9  days  Chief Complaint  Patient presents with   Chest Pain       Brief summary   Patient is a 72 year old male with CAD, CKD stage III, DM type II, uncontrolled, MGUS, chronic systolic CHF, NSVT, resistant hypertension, hyperlipidemia presented to ED with shortness of breath.  Per admission history, patient has been complaining of progressive shortness of breath in the last 2 weeks, generalized anterior chest pain with efforts of breathing, no fevers, cough or leg swelling. He also reported chronic leg wounds and have been worsening for the last couple of weeks, more painful. In ED, patient was noted to be hypoxic on attempts at ambulation.  08/10/2023: Underwent aortogram with bilateral lower extremity angiogram 12/11- seen by Dr. Lajoyce Corners-- b/l amputation recommended 12/13- OR for amputations   Assessment & Plan       Acute respiratory failure with hypoxia (HCC) -Patient had presented initially for shortness of breath and hypoxia -Unclear etiology, CTA chest showed no evidence of pulmonary embolus, aortic atherosclerosis, coronary artery calcifications.  No pleural effusion or pneumothorax. -2D echo showed EF of 55 to 60%, no regional WMA, G2 DD -Stable, O2 sats 99% on room air  Anemia -suspected to be post op ABLA -will transfuse 1 units 12/15 and recheck after  Bilateral lower extremity wounds, gangrene, chronic limb ischemia -MRIs suggestive of osteo -Vascular surgery following, underwent aortogram with bilateral lower extremity angiogram.  Prior vascular surgery, inline flow to the foot without flow-limiting stenosis bilaterally, microvascular disease, remains high risk for amputation.  No  intervention to offer from a vascular surgery perspective, recommended wound care and orthopedics consult for debridement versus amputation. -Orthopedics consulted- recommending amputations- OR on 12/13 -Continue IV Rocephin 24 hours post op.   -PT eval  Acute kidney injury on CKD stage IIIa -Baseline creatinine 1.2,  presented with creatinine of 1.78, lactic acidosis -Cr trending down -Possibly due to contrast nephropathy from CTA, vancomycin, infection/bilateral lower extremity wounds, meds -Entresto, Aldactone, furosemide held since admission. -Placed on IV fluid hydration, renal ultrasound showed medical renal disease, no hydronephrosis or stone, -Nephrology consulted --daily labs  Acute metabolic encephalopathy, likely has underlying dementia -May have underlying dementia.  CT head in 04/2022 had shown atrophy with chronic microvascular disease -UA showed> 500 glucose, small Hgb, otherwise negative for UTI -CT head showed no acute intracranial abnormality -B12 3571, folate 12.1, TSH 2.4 -outpatient follow up     Chronic combined systolic and diastolic heart failure (HCC) -previous echo 12/2022 had shown EF of 45 to 50% with G1 DD -BNP 200.5, -2D echo showed EF of 55 to 60%, no regional WMA, G2 DD     Type 2 diabetes mellitus with chronic kidney disease, with long-term current use of insulin (HCC) -Uncontrolled with hyperglycemia -Hemoglobin A1c 9.1 -Continue sliding scale  insulin, sensitive while inpatient  Prolonged QTc -EKG on admission shows QTc of 522 -Avoid QT prolonging meds     MGUS (monoclonal gammopathy of unknown significance) -Outpatient follow with Dr. [name], last seen on 12/05/22  Estimated body mass index is 22.15 kg/m as calculated from the following:   Height as of this encounter: 5\' 9"  (1.753 m).   Weight as of this encounter: 68 kg.      Code Status: Full code DVT Prophylaxis:  SCD's Start: 08/14/23 1124 enoxaparin (LOVENOX) injection 30 mg  Start: 08/09/23 1000 SCDs Start: 08/07/23 2043   Level of Care: Level of care: Telemetry Medical Family Communication: [phone]- wife please call with updates daily   Disposition Plan:      Remains inpatient appropriate   Procedures:  08/10/2023 Ultrasound-guided access of left common femoral artery Second order cannulation of right external iliac Aortogram Bilateral lower extremity angiogram Mynx closure of left common femoral artery access 5 minutes moderate sedation   Consultants:   Vascular surgery Nephrology Orthopedics       Medications  vitamin C  1,000 mg Oral Daily   aspirin EC  81 mg Oral Daily   enoxaparin (LOVENOX) injection  30 mg Subcutaneous Q24H   insulin aspart  0-6 Units Subcutaneous TID WC   insulin detemir  8 Units Subcutaneous BID   lidocaine  1 patch Transdermal Q24H   nutrition supplement (JUVEN)  1 packet Oral BID BM   sodium chloride flush  3 mL Intravenous Q12H   zinc sulfate (50mg  elemental zinc)  220 mg Oral Daily      Subjective:   Did not sleep well  Objective:   Vitals:   08/16/23 0300 08/16/23 0613 08/16/23 0645 08/16/23 0800  BP: (!) 170/77 (!) 172/77 (!) 153/69 (!) 158/75  Pulse:  68    Resp: 16 19  14   Temp: 98 F (36.7 C) 97.9 F (36.6 C) 97.9 F (36.6 C) 97.8 F (36.6 C)  TempSrc: Oral Oral Oral Axillary  SpO2:  93%    Weight:      Height:        Intake/Output Summary (Last 24 hours) at 08/16/2023 1102 Last data filed at 08/16/2023 0900 Gross per 24 hour  Intake 680 ml  Output 300 ml  Net 380 ml     Wt Readings from Last 3 Encounters:  08/14/23 68 kg  03/12/23 65 kg  12/29/22 66.1 kg     General: Appearance:    Well developed, well nourished male in no acute distress     Lungs:      respirations unlabored  Heart:    Normal heart rate.   MS:   B/l amputations  Neurologic:   Awake, alert, flat affect-- poor eye contact      Data Reviewed:  I have personally reviewed following labs     CBC Lab Results  Component Value Date   WBC 9.6 08/16/2023   RBC 2.10 (L) 08/16/2023   HGB 7.4 (L) 08/16/2023   HCT 22.1 (L) 08/16/2023   MCV 94.3 08/16/2023   MCH 31.0 08/16/2023   PLT 372 08/16/2023   MCHC 32.8 08/16/2023   RDW 13.5 08/16/2023   LYMPHSABS 1.1 12/25/2022   MONOABS 0.4 12/25/2022   EOSABS 0.1 12/25/2022   BASOSABS 0.0 12/25/2022     Last metabolic panel Lab Results  Component Value Date   NA 133 (L) 08/16/2023   K 4.3 08/16/2023   CL 104 08/16/2023   CO2 24 08/16/2023  BUN 41 (H) 08/16/2023   CREATININE 2.17 (H) 08/16/2023   GLUCOSE 229 (H) 08/16/2023   GFRNONAA 32 (L) 08/16/2023   GFRAA 78 09/26/2020   CALCIUM 7.5 (L) 08/16/2023   PHOS 2.7 11/11/2018   PROT 6.6 08/08/2023   ALBUMIN 1.9 (L) 08/08/2023   LABGLOB 4.0 (H) 12/25/2022   AGRATIO 0.9 12/25/2022   BILITOT 0.9 08/08/2023   ALKPHOS 59 08/08/2023   AST 14 (L) 08/08/2023   ALT 10 08/08/2023   ANIONGAP 5 08/16/2023    CBG (last 3)  Recent Labs    08/15/23 1642 08/15/23 2111 08/16/23 0617  GLUCAP 289* 294* 172*      Coagulation Profile: No results for input(s): "INR", "PROTIME" in the last 168 hours.    Radiology Studies: I have personally reviewed the imaging studies  No results found.     Joseph Art DO [facility] 08/16/2023, 11:02 AM  Available via Epic secure chat 7am-7pm After 7 pm, please refer to night coverage provider listed on amion.

## 2023-08-16 NOTE — Progress Notes (Signed)
Blood transfusion started. Consent placed in chart. Education done with patient

## 2023-08-16 NOTE — Progress Notes (Signed)
Wound vac dressing to bilateral stumps leaking. Dressing reapplied to stop leaking

## 2023-08-16 NOTE — Progress Notes (Signed)
Hgb is 6.5 this morning, down from 8.1 prior to his b/l BKAs on 08/14/23. Plan to transfuse 1 unit RBCs.

## 2023-08-16 NOTE — Plan of Care (Signed)
  Problem: Education: Goal: Individualized Educational Video(s) Outcome: Progressing   Problem: Coping: Goal: Ability to adjust to condition or change in health will improve Outcome: Progressing   Problem: Fluid Volume: Goal: Ability to maintain a balanced intake and output will improve Outcome: Progressing   

## 2023-08-17 DIAGNOSIS — Z89512 Acquired absence of left leg below knee: Secondary | ICD-10-CM

## 2023-08-17 DIAGNOSIS — I5042 Chronic combined systolic (congestive) and diastolic (congestive) heart failure: Secondary | ICD-10-CM | POA: Diagnosis not present

## 2023-08-17 DIAGNOSIS — D62 Acute posthemorrhagic anemia: Secondary | ICD-10-CM

## 2023-08-17 DIAGNOSIS — J9601 Acute respiratory failure with hypoxia: Secondary | ICD-10-CM | POA: Diagnosis not present

## 2023-08-17 DIAGNOSIS — Z89511 Acquired absence of right leg below knee: Secondary | ICD-10-CM

## 2023-08-17 LAB — CBC
HCT: 22.3 % — ABNORMAL LOW (ref 39.0–52.0)
Hemoglobin: 7.4 g/dL — ABNORMAL LOW (ref 13.0–17.0)
MCH: 31 pg (ref 26.0–34.0)
MCHC: 33.2 g/dL (ref 30.0–36.0)
MCV: 93.3 fL (ref 80.0–100.0)
Platelets: 351 10*3/uL (ref 150–400)
RBC: 2.39 MIL/uL — ABNORMAL LOW (ref 4.22–5.81)
RDW: 15.8 % — ABNORMAL HIGH (ref 11.5–15.5)
WBC: 8.2 10*3/uL (ref 4.0–10.5)
nRBC: 0 % (ref 0.0–0.2)

## 2023-08-17 LAB — BASIC METABOLIC PANEL
Anion gap: 7 (ref 5–15)
BUN: 32 mg/dL — ABNORMAL HIGH (ref 8–23)
CO2: 24 mmol/L (ref 22–32)
Calcium: 7.5 mg/dL — ABNORMAL LOW (ref 8.9–10.3)
Chloride: 105 mmol/L (ref 98–111)
Creatinine, Ser: 1.49 mg/dL — ABNORMAL HIGH (ref 0.61–1.24)
GFR, Estimated: 50 mL/min — ABNORMAL LOW (ref >60)
Glucose, Bld: 145 mg/dL — ABNORMAL HIGH (ref 70–99)
Potassium: 4.5 mmol/L (ref 3.5–5.1)
Sodium: 136 mmol/L (ref 135–145)

## 2023-08-17 LAB — GLUCOSE, CAPILLARY
Glucose-Capillary: 209 mg/dL — ABNORMAL HIGH (ref 70–99)
Glucose-Capillary: 248 mg/dL — ABNORMAL HIGH (ref 70–99)
Glucose-Capillary: 244 mg/dL — ABNORMAL HIGH (ref 70–99)
Glucose-Capillary: 208 mg/dL — ABNORMAL HIGH (ref 70–99)

## 2023-08-17 LAB — SURGICAL PATHOLOGY

## 2023-08-17 MED ORDER — INSULIN DETEMIR 100 UNIT/ML ~~LOC~~ SOLN
10.0000 [IU] | Freq: Two times a day (BID) | SUBCUTANEOUS | Status: DC
Start: 2023-08-17 — End: 2023-08-24
  Administered 2023-08-17 – 2023-08-23 (×13): 10 [IU] via SUBCUTANEOUS
  Filled 2023-08-17 (×16): qty 0.1

## 2023-08-17 MED ORDER — FENTANYL CITRATE PF 50 MCG/ML IJ SOSY
25.0000 ug | PREFILLED_SYRINGE | INTRAMUSCULAR | Status: DC | PRN
  Administered 2023-08-17 – 2023-08-18 (×4): 50 ug via INTRAVENOUS
  Filled 2023-08-17 (×4): qty 1

## 2023-08-17 NOTE — Plan of Care (Signed)
  Problem: Education: Goal: Ability to describe self-care measures that may prevent or decrease complications (Diabetes Survival Skills Education) will improve Outcome: Progressing Goal: Individualized Educational Video(s) Outcome: Progressing   Problem: Coping: Goal: Ability to adjust to condition or change in health will improve Outcome: Progressing Note: Pt slowly progressing. Pt expressed depression r/t BKA x2. I sat and spoke with pt about non-pharmacological depression management and pt seemed hopeful.   Problem: Fluid Volume: Goal: Ability to maintain a balanced intake and output will improve Outcome: Progressing   Problem: Health Behavior/Discharge Planning: Goal: Ability to identify and utilize available resources and services will improve Outcome: Progressing Goal: Ability to manage health-related needs will improve Outcome: Progressing   Problem: Metabolic: Goal: Ability to maintain appropriate glucose levels will improve Outcome: Progressing   Problem: Nutritional: Goal: Maintenance of adequate nutrition will improve Outcome: Progressing Note: Pt encouraged to drink Juven when scheduled. Pt drank all juven ordered today. Goal: Progress toward achieving an optimal weight will improve Outcome: Progressing   Problem: Skin Integrity: Goal: Risk for impaired skin integrity will decrease Outcome: Progressing   Problem: Tissue Perfusion: Goal: Adequacy of tissue perfusion will improve Outcome: Progressing   Problem: Education: Goal: Knowledge of General Education information will improve Description: Including pain rating scale, medication(s)/side effects and non-pharmacologic comfort measures Outcome: Progressing   Problem: Health Behavior/Discharge Planning: Goal: Ability to manage health-related needs will improve Outcome: Progressing   Problem: Clinical Measurements: Goal: Ability to maintain clinical measurements within normal limits will improve Outcome:  Progressing Goal: Will remain free from infection Outcome: Progressing Goal: Diagnostic test results will improve Outcome: Progressing Goal: Respiratory complications will improve Outcome: Progressing Goal: Cardiovascular complication will be avoided Outcome: Progressing   Problem: Activity: Goal: Risk for activity intolerance will decrease Outcome: Progressing   Problem: Nutrition: Goal: Adequate nutrition will be maintained Outcome: Progressing   Problem: Coping: Goal: Level of anxiety will decrease Outcome: Progressing   Problem: Elimination: Goal: Will not experience complications related to bowel motility Outcome: Progressing Goal: Will not experience complications related to urinary retention Outcome: Progressing   Problem: Pain Management: Goal: General experience of comfort will improve Outcome: Progressing Note: Phantom pain present. Pt stated fentanyl has helped with this.    Problem: Safety: Goal: Ability to remain free from injury will improve Outcome: Progressing   Problem: Skin Integrity: Goal: Risk for impaired skin integrity will decrease Outcome: Progressing   Problem: Education: Goal: Knowledge of the prescribed therapeutic regimen will improve Outcome: Progressing Goal: Ability to verbalize activity precautions or restrictions will improve Outcome: Progressing Goal: Understanding of discharge needs will improve Outcome: Progressing   Problem: Activity: Goal: Ability to perform//tolerate increased activity and mobilize with assistive devices will improve Outcome: Progressing   Problem: Clinical Measurements: Goal: Postoperative complications will be avoided or minimized Outcome: Progressing   Problem: Self-Care: Goal: Ability to meet self-care needs will improve Outcome: Progressing   Problem: Self-Concept: Goal: Ability to maintain and perform role responsibilities to the fullest extent possible will improve Outcome: Progressing    Problem: Pain Management: Goal: Pain level will decrease with appropriate interventions Outcome: Progressing   Problem: Skin Integrity: Goal: Demonstration of wound healing without infection will improve Outcome: Progressing

## 2023-08-17 NOTE — NC FL2 (Signed)
[place] MEDICAID FL2 LEVEL OF CARE FORM     IDENTIFICATION  Patient Name: Earl Calderon Birthdate: 11/18/1950 Sex: male Admission Date (Current Location): 08/07/2023  [facility] and [place] Number:  Producer, television/film/video and Address:  [facility] [place], [address], [place], [place] [address]      Provider Number: [id number]  Attending Physician Name and Address:  Carollee Herter, DO  Relative Name and Phone Number:       Current Level of Care: Hospital Recommended Level of Care: Skilled Nursing Facility Prior Approval Number:    Date Approved/Denied:   PASRR Number: [id number]  Discharge Plan: SNF    Current Diagnoses: Patient Active Problem List   Diagnosis Date Noted   Acute postoperative anemia due to expected blood loss 08/17/2023   S/P bilateral BKA (below knee amputation) (HCC) - on 08/14/2023 08/14/2023   Diabetes mellitus with foot ulcer and gangrene (HCC) 08/12/2023   Ulcer of both feet, limited to breakdown of skin (HCC) 08/12/2023   Acute metabolic encephalopathy 08/08/2023   Acute respiratory failure with hypoxia (HCC) 08/08/2023   Gangrene of both feet (HCC) 08/07/2023   Acute renal failure superimposed on stage 3a chronic kidney disease (HCC) 08/07/2023   Hypoxia 08/07/2023   Pure hypercholesterolemia 08/05/2021   MGUS (monoclonal gammopathy of unknown significance) 07/01/2021   Lymphadenopathy, cervical 07/01/2021   Goals of care, counseling/discussion 07/01/2021   CAD in native artery 01/01/2021   NSVT (nonsustained ventricular tachycardia) (HCC) 08/24/2020   Dyspnea    Acute on chronic combined systolic (congestive) and diastolic (congestive) heart failure (HCC) 07/11/2020   Status post coronary artery stent placement    LBBB (left bundle branch block) 11/13/2018   Chronic combined systolic and diastolic heart failure (HCC) 11/11/2018   Resistant hypertension 11/11/2018   Type 2 diabetes mellitus with chronic kidney disease,  with long-term current use of insulin (HCC) 11/11/2018   Hypomagnesemia 11/11/2018   Hyperbilirubinemia 11/11/2018   Abnormal LFTs 11/11/2018    Orientation RESPIRATION BLADDER Height & Weight     Self, Time, Situation, Place  Normal   Weight: 150 lb (68 kg) Height:  5\' 9"  (175.3 cm)  BEHAVIORAL SYMPTOMS/MOOD NEUROLOGICAL BOWEL NUTRITION STATUS        Diet (please see discharge summary)  AMBULATORY STATUS COMMUNICATION OF NEEDS Skin       Other (Comment) (neagative pressure wound right & left leg, wound incision pretibial right & left leg, closed incision right & left leg, negative pressure wound therapy left & right leg)                       Personal Care Assistance Level of Assistance  Bathing, Feeding, Dressing Bathing Assistance: Limited assistance Feeding assistance: Independent Dressing Assistance: Limited assistance     Functional Limitations Info  Sight, Hearing, Speech Sight Info: Adequate Hearing Info: Adequate Speech Info: Adequate    SPECIAL CARE FACTORS FREQUENCY  PT (By licensed PT), OT (By licensed OT)     PT Frequency: 5x per week OT Frequency: 5x per week            Contractures Contractures Info: Not present    Additional Factors Info  Code Status, Allergies Code Status Info: FULL Allergies Info: NKA           Current Medications (08/17/2023):  This is the current hospital active medication list Current Facility-Administered Medications  Medication Dose Route Frequency Provider Last Rate Last Admin   0.9 %  sodium chloride infusion   Intravenous Continuous Nadara Mustard, MD       acetaminophen (TYLENOL) tablet 650 mg  650 mg Oral Q6H PRN Nadara Mustard, MD   650 mg at 08/16/23 0981   Or   acetaminophen (TYLENOL) suppository 650 mg  650 mg Rectal Q6H PRN Nadara Mustard, MD       ascorbic acid (VITAMIN C) tablet 1,000 mg  1,000 mg Oral Daily Nadara Mustard, MD   1,000 mg at 08/17/23 0900   aspirin EC tablet 81 mg  81 mg Oral Daily  Nadara Mustard, MD   81 mg at 08/17/23 0900   enoxaparin (LOVENOX) injection 30 mg  30 mg Subcutaneous Q24H Nadara Mustard, MD   30 mg at 08/17/23 0859   fentaNYL (SUBLIMAZE) injection 25-50 mcg  25-50 mcg Intravenous Q2H PRN Briscoe Deutscher, MD   50 mcg at 08/17/23 1044   hydrALAZINE (APRESOLINE) tablet 25 mg  25 mg Oral Q6H PRN Nadara Mustard, MD   25 mg at 08/17/23 0547   insulin aspart (novoLOG) injection 0-6 Units  0-6 Units Subcutaneous TID WC Nadara Mustard, MD   2 Units at 08/17/23 1237   insulin detemir (LEVEMIR) injection 8 Units  8 Units Subcutaneous BID Nadara Mustard, MD   8 Units at 08/17/23 0900   lidocaine (LIDODERM) 5 % 1 patch  1 patch Transdermal Q24H Nadara Mustard, MD   1 patch at 08/15/23 1237   LORazepam (ATIVAN) tablet 1 mg  1 mg Oral Q8H PRN Nadara Mustard, MD   1 mg at 08/15/23 1914   Or   LORazepam (ATIVAN) injection 1 mg  1 mg Intramuscular Q8H PRN Nadara Mustard, MD       magnesium sulfate IVPB 2 g 50 mL  2 g Intravenous Daily PRN Nadara Mustard, MD       nutrition supplement (JUVEN) (JUVEN) powder packet 1 packet  1 packet Oral BID BM Nadara Mustard, MD   1 packet at 08/17/23 1403   ondansetron (ZOFRAN) injection 4 mg  4 mg Intravenous Q6H PRN Nadara Mustard, MD   4 mg at 08/12/23 1546   oxyCODONE (Oxy IR/ROXICODONE) immediate release tablet 5 mg  5 mg Oral Q4H PRN Opyd, Lavone Neri, MD   5 mg at 08/17/23 0545   polyethylene glycol (MIRALAX / GLYCOLAX) packet 17 g  17 g Oral Daily PRN Nadara Mustard, MD       potassium chloride SA (KLOR-CON M) CR tablet 20-40 mEq  20-40 mEq Oral Daily PRN Nadara Mustard, MD       sodium chloride flush (NS) 0.9 % injection 3 mL  3 mL Intravenous Q12H Nadara Mustard, MD   3 mL at 08/17/23 0900   zinc sulfate (50mg  elemental zinc) capsule 220 mg  220 mg Oral Daily Nadara Mustard, MD   220 mg at 08/17/23 0900     Discharge Medications: Please see discharge summary for a list of discharge medications.  Relevant Imaging  Results:  Relevant Lab Results:   Additional Information SSN [id number]  Eduard Roux, LCSW

## 2023-08-17 NOTE — Progress Notes (Signed)
Physical Therapy Treatment Patient Details Name: Earl Calderon MRN: [id number] DOB: 10-May-1951 Today's Date: 08/17/2023   History of Present Illness Patient is a 72 yo male presenting to the ED with SOB and fatigue on 08/07/23. Admitted with acute respiratory failure with hypoxia. Found to have bilateral lower extremity wounds, gangrene, chronic limb ischemia. Arteriogram performed on 12/9. 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  PMH includes: CAD, CKD stage III, DM type II, uncontrolled, MGUS, chronic systolic CHF, NSVT, resistant hypertension, hyperlipidemia    PT Comments  Pt received in bed, agreeable to attempt transfer OOB. Pt with standard recliner in room. Therefore, elected to trial ant/post slide transfer. He required mod assist to come to long sitting, and +2 mod assist using bed pad for posterior scoot, bed to recliner. Good active participation from pt, but required higher assist level due to novelty of approach. This was pt's first time OOB. Pt remained in recliner at end of session. Pillow placed under bilat residual limbs for pressure relief and comfort. Maximove sling in place for return to bed.     If plan is discharge home, recommend the following: Two people to help with walking and/or transfers;Assistance with cooking/housework;Assist for transportation;Help with stairs or ramp for entrance;Supervision due to cognitive status   Can travel by private vehicle     No  Equipment Recommendations  Wheelchair (measurements PT);Wheelchair cushion (measurements PT);Hoyer lift;Other (comment) (ramp, drop arm BSC, slide board)    Recommendations for Other Services       Precautions / Restrictions Precautions Precautions: Fall;Other (comment) Precaution Comments: wound vacs BLE Restrictions Weight Bearing Restrictions Per Provider Order: Yes RLE Weight Bearing Per Provider Order: Non weight bearing LLE Weight Bearing Per Provider Order: Non weight bearing Other Position/Activity  Restrictions: Bilateral BKA     Mobility  Bed Mobility Overal bed mobility: Needs Assistance Bed Mobility: Supine to Sit     Supine to sit: Mod assist, Used rails     General bed mobility comments: assist to pull up to long sitting in bed, cues for sequencing    Transfers Overall transfer level: Needs assistance Equipment used: None Transfers: Bed to chair/wheelchair/BSC         Anterior-Posterior transfers: +2 physical assistance, Mod assist   General transfer comment: posterior transfer bed to recliner (due to no drop arm recliner in room), +2 assist using bed pad, cues for sequencing.    Ambulation/Gait                   Stairs             Wheelchair Mobility     Tilt Bed    Modified Rankin (Stroke Patients Only)       Balance Overall balance assessment: Needs assistance Sitting-balance support: Bilateral upper extremity supported, Feet unsupported Sitting balance-Leahy Scale: Poor                                      Cognition Arousal: Alert Behavior During Therapy: WFL for tasks assessed/performed (easily agitated) Overall Cognitive Status: No family/caregiver present to determine baseline cognitive functioning                                 General Comments: A&O x 3. Difficulty staying on task. Tangential. Does well with step by step instructions.        Exercises  General Comments        Pertinent Vitals/Pain Pain Assessment Pain Assessment: Faces Faces Pain Scale: Hurts little more Pain Location: BLE Pain Descriptors / Indicators: Tender, Grimacing, Guarding Pain Intervention(s): Monitored during session, Repositioned    Home Living                          Prior Function            PT Goals (current goals can now be found in the care plan section) Acute Rehab PT Goals Patient Stated Goal: home Progress towards PT goals: Progressing toward goals    Frequency     Min 1X/week      PT Plan      Co-evaluation              AM-PAC PT "6 Clicks" Mobility   Outcome Measure  Help needed turning from your back to your side while in a flat bed without using bedrails?: A Little Help needed moving from lying on your back to sitting on the side of a flat bed without using bedrails?: A Little Help needed moving to and from a bed to a chair (including a wheelchair)?: Total Help needed standing up from a chair using your arms (e.g., wheelchair or bedside chair)?: Total Help needed to walk in hospital room?: Total Help needed climbing 3-5 steps with a railing? : Total 6 Click Score: 10    End of Session   Activity Tolerance: Patient tolerated treatment well Patient left: in chair;with call bell/phone within reach;with chair alarm set Nurse Communication: Mobility status;Need for lift equipment PT Visit Diagnosis: Other abnormalities of gait and mobility (R26.89)     Time: 4696-2952 PT Time Calculation (min) (ACUTE ONLY): 18 min  Charges:    $Therapeutic Activity: 8-22 mins PT General Charges $$ ACUTE PT VISIT: 1 Visit                     Ferd Glassing., PT  Office # [phone]    Ilda Foil 08/17/2023, 9:06 AM

## 2023-08-17 NOTE — Anesthesia Postprocedure Evaluation (Signed)
Anesthesia Post Note  Patient: Earl Calderon  Procedure(s) Performed: BILATERAL BELOW KNEE AMPUTATION (Bilateral: Knee)     Patient location during evaluation: PACU Anesthesia Type: General Level of consciousness: awake and alert Pain management: pain level controlled Vital Signs Assessment: post-procedure vital signs reviewed and stable Respiratory status: spontaneous breathing, nonlabored ventilation, respiratory function stable and patient connected to nasal cannula oxygen Cardiovascular status: blood pressure returned to baseline and stable Postop Assessment: no apparent nausea or vomiting Anesthetic complications: no   No notable events documented.  Last Vitals:  Vitals:   08/17/23 0300 08/17/23 0900  BP: (!) 162/68 (!) 156/69  Pulse: (!) 57 63  Resp: 14 11  Temp: 36.6 C   SpO2: 97% 99%    Last Pain:  Vitals:   08/17/23 1114  TempSrc:   PainSc: Asleep                 Prudie Guthridge S

## 2023-08-17 NOTE — Subjective & Objective (Addendum)
Pt seen and examined. Pt not as agitated. Took qpm seroquel last night. Pt c/o of pain. Add po dilaudid. Stopped IV fentanyl CM making preparation for DC tomorrow.

## 2023-08-17 NOTE — Progress Notes (Signed)
Pt  called out saying he needs to be changed. I approached the room and asked pt if he had a BM. Pt stated that he did and continued to ask questions that did not make sense. Pt got frustrated and irrate and said he did not want to be changed. I instructed pt to turn to his left, do what he can and I will help you. Pt complained that he is getting stiff, is sore, and that the "system" got him. I continued to instruct the pt to turn to be able to see if he actually had a bm. Pt started becoming a bit more aggressive and verbally attacking me. I informed the pt that frustration is normal but it is not helping the situation and to trust me to do my job and help him. Pt refused.

## 2023-08-17 NOTE — Progress Notes (Signed)
PROGRESS NOTE    Earl Calderon  ZOX:[id number] DOB: [birth year] DOA: 08/07/2023 PCP: Melvenia Beam, MD  Subjective: Pt seen and examined. Pt is s/p bilateral trans-tibial amputation on 08-14-2023. Doing well otherwise. Still have bilateral wound vacs on stumps. He will need SNF for rehab. CM aware.   Hospital Course: HPI: Earl Calderon is a 72 y.o. male with medical history significant of CKD, DM, MGUS and documented CHF in chart.  EF was 49% in May, 2024. LVH.    On review of chart, I do not notice any pulmonary pathology being documented.  Patient reports that his baseline functional status is 1 of getting "tired "after doing yard work in the house.  Otherwise he is able to get up and walk around the house without any trouble breathing.  However for the last 2 weeks patient describes a progressive sensation of shortness of breath.  Initially present with exertion and now patient states he is feeling short of breath even at rest.  This prompted the patient to come to the hospital today.  Patient further reports generalized anterior chest discomfort with efforts of breathing.  Patient does not report any fever or any pleuritic chest pain or any cough or leg swelling.   Patient's reason for coming to the hospital are as above per patient.   Further patient reports that he has had chronic leg "sores" for several weeks time, they have been worsening on both lower extremities for the last couple of weeks.  And they are more painful.   Further report is obtained from the ER provider.  Asher Muir Barrett reports that the patient was found to be hypoxic on attempts at ambulation.  However he is not hypoxic at rest.  Oxygen has been given as needed.  Troponins have been mildly elevated, medical evaluation is sought. Review of Systems: Unable to review all systems due to lack of cooperation from patient.  Patient wanted to not answer more for questions or engage in prolonged encounter.   Therefore review of system was not possible similarly exam was limited.  Antibiotic Therapy: Anti-infectives (From admission, onward)    Start     Dose/Rate Route Frequency Ordered Stop   08/14/23 1015  vancomycin (VANCOCIN) powder  Status:  Discontinued          As needed 08/14/23 1015 08/14/23 1035   08/14/23 0815  ceFAZolin (ANCEF) IVPB 2g/100 mL premix        2 g 200 mL/hr over 30 Minutes Intravenous On call to O.R. 08/14/23 0729 08/14/23 1012   08/11/23 0800  cefTRIAXone (ROCEPHIN) 1 g in sodium chloride 0.9 % 100 mL IVPB        1 g 200 mL/hr over 30 Minutes Intravenous Daily 08/11/23 0559 08/15/23 2359   08/10/23 2200  vancomycin (VANCOCIN) IVPB 1000 mg/200 mL premix  Status:  Discontinued        1,000 mg 200 mL/hr over 60 Minutes Intravenous Every 48 hours 08/09/23 1047 08/10/23 1206   08/10/23 1206  vancomycin variable dose per unstable renal function (pharmacist dosing)  Status:  Discontinued         Does not apply See admin instructions 08/10/23 1206 08/10/23 1218   08/10/23 0600  cefUROXime (ZINACEF) 1.5 g in sodium chloride 0.9 % 100 mL IVPB        1.5 g 200 mL/hr over 30 Minutes Intravenous On call to O.R. 08/09/23 0846 08/11/23 0559   08/09/23 1400  piperacillin-tazobactam (ZOSYN) IVPB 2.25 g  Status:  Discontinued  2.25 g 100 mL/hr over 30 Minutes Intravenous Every 8 hours 08/09/23 1050 08/11/23 0557   08/08/23 2200  vancomycin (VANCOREADY) IVPB 750 mg/150 mL  Status:  Discontinued        750 mg 150 mL/hr over 60 Minutes Intravenous Every 24 hours 08/08/23 0015 08/09/23 1047   08/07/23 2330  Vancomycin (VANCOCIN) 1,500 mg in sodium chloride 0.9 % 500 mL IVPB        1,500 mg 250 mL/hr over 120 Minutes Intravenous  Once 08/07/23 2240 08/08/23 0740   08/07/23 2300  piperacillin-tazobactam (ZOSYN) IVPB 3.375 g  Status:  Discontinued        3.375 g 12.5 mL/hr over 240 Minutes Intravenous Every 8 hours 08/07/23 2240 08/09/23 1050   08/07/23 1715  cefTRIAXone  (ROCEPHIN) 1 g in sodium chloride 0.9 % 100 mL IVPB        1 g 200 mL/hr over 30 Minutes Intravenous  Once 08/07/23 1707 08/07/23 1848       Procedures: 08-10-2023 Aortogram with LE angiogram 08-14-2023 Bilateral BKA  Consultants: Ortho nephrology    Assessment and Plan: * Acute respiratory failure with hypoxia (HCC) On admission, Patient had presented initially for shortness of breath and hypoxia. -Unclear etiology, CTA chest showed no evidence of pulmonary embolus, aortic atherosclerosis, coronary artery calcifications.  No pleural effusion or pneumothorax. -2D echo still pending -O2 sats 100% on 2 L  08-17-2023 pt the time I saw the patient today, his 2D echo showed EF of 55 to 60%, no regional WMA, G2 DD Stable, O2 sats 99% on room air  Acute postoperative anemia due to expected blood loss 08-17-2023 had 1 unit PRBC transfused on 08-16-2023.  S/P bilateral BKA (below knee amputation) (HCC) - on 08/14/2023 08-17-2023 continues with wound vac. Will need SNF at discharge.  Chronic combined systolic and diastolic heart failure (HCC) Chronically documented. P[place]net does not appear to be fluids overloaded at this time. Med rec pending pharmacy input. Trop of 127 at presentation now down to 39 - over all presentation not consistnt with ACS. EKG showing stable LVH changes since 12/29/2022. See complaint of SOB/hypoxia as well.  Hypoxia On admission through 12-15, 2024, This is actually the patient's principal reason for presentation.  Patient reports last couple of weeks of exertional shortness of breath that is progressive and now short of breath even at rest.  CT PE study is actually negative and I do not find marked fluid overload on the limited exam that I could do for the patient before patient interrupted the encounter.  Per report patient was actually hypoxic on ambulation but not at rest.  Given the current state of workup, residual concerns include possible pulmonary artery  hypertension, pleural effusion or other limitation of cardiac output.  Especially given the troponin elevation I will go ahead and proceed with an echo at this time and see where that leads [place].  Continue with as needed oxygen.  08-17-2023 resolved.  Acute renal failure superimposed on stage 3a chronic kidney disease (HCC) On admission through 12-15, 2024, Patient baseline creatinine between 1.06 and 1.31.  Current creatinine 1.78.  Without associated elevated BUN.  I suspect this may be related to infection and hyperglycemia.  Patient received 500 cc normal saline fluid bolus in the ER.  Check urinalysis sodium creatinine.  Trend creatinine response to 75 cc/h fluid hydration overnight  08-17-2023 Scr 1.49. at baseline.  Gangrene of both feet (HCC) On admission through 12-15, 2024, , Of b/l lower extermity - foot  and ankle. Patient has foul-smelling slightly wet appearing black margin ulcers, see picture in media section of the chart.  Concerning for wet gangrene in this patient with uncontrolled diabetes mellitus and AKI. No  sepsis. Slight lactic acidosis POA, resolved. Patient has had ankle-brachial index evaluation done, see separate study in the CV procedure section.  Patient has abnormal right and left toe to brachial index.  At this time time MRI is pending to rule out deep osteomyelitis versus occult abscess in the feet.  Patient is s/p Rocephin.  I will draw blood cultures and start the patient on vancomycin and Zosyn.  Patient will need surgical input given the gangrene in his feet. I discussed case with Marisue Humble PA of vascular surgeon. Dr. Hetty Blend is in the OR (he was indirectly made aware of the consult).  08-17-2023 pt is s/p bilateral BKA(trans-tibial). Will need SNF. No need for abx. He has surgical cure of his gangrene.  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  He has an IgG kappa spike. To me, I  still think that his diabetes can be a much bigger problem than myeloma will be. Hopefully, he will be able to get the blood sugar under control. We will have to follow him up.  We will have to get him back in about 2 or 3 months for follow-up. Hopefully, his blood sugars would not cause problems for him that we will end him up in the hospital."  08-17-2023 stable.  Type 2 diabetes mellitus with chronic kidney disease, with long-term current use of insulin (HCC) On admission, Associated with hyperglycemia, without evidence of DKA.  Glucose on presentation was 525.  Likely worsened by infection and possible nonadherence to regimen.  Patient received 10 units of subcutaneous insulin in the ER.  I will continue with patient's remotely documented Levemir 8 units twice daily as well as insulin sliding scale ACHS moderate.  We will trend this.  08-17-2023 on bid levemir and SSI. CBG in mid 200s. Will increase levemir to 10 units BID.    DVT prophylaxis: SCD's Start: 08/14/23 1124 enoxaparin (LOVENOX) injection 30 mg Start: 08/09/23 1000 SCDs Start: 08/07/23 2043    Code Status: Full Code Family Communication: attempted to call pt's wife at [phone]. No answer Disposition Plan: SNF Reason for continuing need for hospitalization: medically stable for DC to SNF.  Objective: Vitals:   08/16/23 2355 08/17/23 0300 08/17/23 0900 08/17/23 1200  BP:  (!) 162/68 (!) 156/69 (!) 143/65  Pulse: (!) 57 (!) 57 63 (!) 55  Resp: 11 14 11 18   Temp:  97.9 F (36.6 C)  98.2 F (36.8 C)  TempSrc:  Oral  Oral  SpO2: 100% 97% 99% 99%  Weight:      Height:        Intake/Output Summary (Last 24 hours) at 08/17/2023 1433 Last data filed at 08/17/2023 1200 Gross per 24 hour  Intake 1443 ml  Output 400 ml  Net 1043 ml   Filed Weights   08/07/23 2223 08/14/23 0813  Weight: 64.9 kg 68 kg    Examination:  Physical Exam Vitals and nursing note reviewed.  Constitutional:      General: He is not in  acute distress.    Appearance: He is not toxic-appearing.  HENT:     Head: Normocephalic and atraumatic.     Nose: Nose normal.  Cardiovascular:     Rate and Rhythm: Normal rate and  regular rhythm.  Abdominal:     General: Abdomen is flat.     Palpations: Abdomen is soft.  Musculoskeletal:     Comments: Bilateral BKA. Bilateral wound vac  Skin:    General: Skin is warm and dry.  Neurological:     Mental Status: He is alert and oriented to person, place, and time.     Data Reviewed: I have personally reviewed following labs and imaging studies  CBC: Recent Labs  Lab 08/12/23 0334 08/13/23 0250 08/14/23 0344 08/16/23 0352 08/16/23 1040 08/17/23 0321  WBC 10.8* 9.8 8.4 9.6  --  8.2  HGB 7.9* 8.1* 8.1* 6.5* 7.4* 7.4*  HCT 24.1* 25.1* 24.7* 19.8* 22.1* 22.3*  MCV 93.1 93.7 92.9 94.3  --  93.3  PLT 306 357 371 372  --  351   Basic Metabolic Panel: Recent Labs  Lab 08/12/23 0334 08/13/23 0250 08/14/23 0344 08/16/23 0352 08/17/23 0321  NA 139 140 139 133* 136  K 3.4* 3.6 3.6 4.3 4.5  CL 107 109 108 104 105  CO2 23 24 24 24 24   GLUCOSE 114* 135* 218* 229* 145*  BUN 48* 48* 40* 41* 32*  CREATININE 6.29* 5.28* 3.40* 2.17* 1.49*  CALCIUM 7.4* 7.7* 7.7* 7.5* 7.5*   GFR: Estimated Creatinine Clearance: 44.4 mL/min (A) (by C-G formula based on SCr of 1.49 mg/dL (H)). Cardiac Enzymes: Recent Labs  Lab 08/11/23 0259  CKTOTAL 53   BNP (last 3 results) Recent Labs    08/07/23 1128  BNP 200.5*   CBG: Recent Labs  Lab 08/16/23 1113 08/16/23 1601 08/16/23 2104 08/17/23 0618 08/17/23 1232  GLUCAP 132* 100* 107* 209* 248*    Recent Results (from the past 240 hours)  Culture, blood (Routine X 2) w Reflex to ID Panel     Status: None   Collection Time: 08/07/23  9:31 PM   Specimen: BLOOD  Result Value Ref Range Status   Specimen Description BLOOD RIGHT ANTECUBITAL  Final   Special Requests   Final    BOTTLES DRAWN AEROBIC AND ANAEROBIC Blood Culture results  may not be optimal due to an inadequate volume of blood received in culture bottles   Culture   Final    NO GROWTH 5 DAYS Performed at [facility], [address], [place], [place] [address]    Report Status 08/12/2023 FINAL  Final  Culture, blood (Routine X 2) w Reflex to ID Panel     Status: None   Collection Time: 08/07/23  9:31 PM   Specimen: BLOOD RIGHT WRIST  Result Value Ref Range Status   Specimen Description BLOOD RIGHT WRIST  Final   Special Requests   Final    BOTTLES DRAWN AEROBIC ONLY Blood Culture results may not be optimal due to an inadequate volume of blood received in culture bottles   Culture   Final    NO GROWTH 5 DAYS Performed at [facility], [address], [place], [place] [address]    Report Status 08/12/2023 FINAL  Final  Surgical pcr screen     Status: None   Collection Time: 08/13/23  6:18 PM   Specimen: Nasal Mucosa; Nasal Swab  Result Value Ref Range Status   MRSA, PCR NEGATIVE NEGATIVE Final   Staphylococcus aureus NEGATIVE NEGATIVE Final    Comment: (NOTE) The Xpert SA Assay (FDA approved for NASAL specimens in patients 1 years of age and older), is one component of a comprehensive surveillance program. It is not intended  to diagnose infection nor to guide or monitor treatment. Performed at [facility], [address], [place], [place] [address]      Radiology Studies: No results found.  Scheduled Meds:  vitamin C  1,000 mg Oral Daily   aspirin EC  81 mg Oral Daily   enoxaparin (LOVENOX) injection  30 mg Subcutaneous Q24H   insulin aspart  0-6 Units Subcutaneous TID WC   insulin detemir  10 Units Subcutaneous BID   lidocaine  1 patch Transdermal Q24H   nutrition supplement (JUVEN)  1 packet Oral BID BM   sodium chloride flush  3 mL Intravenous Q12H   zinc sulfate (50mg  elemental zinc)  220 mg Oral Daily   Continuous Infusions:  magnesium sulfate bolus IVPB       LOS: 10 days   Time spent: 40 minutes  Carollee Herter, DO  [facility]  08/17/2023, 2:33 PM

## 2023-08-17 NOTE — Assessment & Plan Note (Addendum)
On admission, Patient had presented initially for shortness of breath and hypoxia. -Unclear etiology, CTA chest showed no evidence of pulmonary embolus, aortic atherosclerosis, coronary artery calcifications.  No pleural effusion or pneumothorax. -2D echo still pending -O2 sats 100% on 2 L  08-17-2023 pt the time I saw the patient today, his 2D echo showed EF of 55 to 60%, no regional WMA, G2 DD Stable, O2 sats 99% on room air  08-18-2023 on RA. Resolved.

## 2023-08-17 NOTE — Assessment & Plan Note (Addendum)
08-17-2023 had 1 unit PRBC transfused on 08-16-2023.  08-19-2023 order CBC today.  08-21-2023 HgB 7.6. this is due to blood loss from daily labs and his bilateral BKA. He will need oral iron therapy at home for a few months. His family doctor can arrange for f/u HgB and IV iron if needed.   08-22-2023 HgB came back at 6.7 g/dl. 2 units PRBC ordered for transfusion. Pt refusing PRBC transfusion. Pt's bedside RN and charge RN will also document pt's refusal of PRBC transfusion  08-23-2023 pt refused PRBC yesterday. Pt is agreeable to receiving 2 unit PRBC transfusion today.

## 2023-08-17 NOTE — TOC Progression Note (Signed)
Transition of Care [facility]) - Progression Note    Patient Details  Name: Earl Calderon MRN: [id number] Date of Birth: 01-08-1951  [facility] CM/SW Contact  Eduard Roux, [place] Phone Number: 08/17/2023, 1:56 PM  Clinical Narrative:     CSW met with patient- CSW introduced self and explained role. Patient states he and his wife are the only ones in the home. Patient states he is aware and agree with recommendation of short term rehab at skilled nursing facility. CSW explained SNF process. No preferred SNF at this time.   TOC will provide bed availability once available TOC will continue to follow and assist with discharge planning.  Antony Blackbird, MSW, LCSW Clinical Social Worker    Expected Discharge Plan: Skilled Nursing Facility (to be determined post op) Barriers to Discharge: Continued Medical Work up  Expected Discharge Plan and Services In-house Referral: Clinical Social Work                                             Social Determinants of Health (SDOH) Interventions SDOH Screenings   Food Insecurity: No Food Insecurity (08/07/2023)  Housing: Low Risk  (08/07/2023)  Transportation Needs: No Transportation Needs (08/07/2023)  Utilities: Not At Risk (08/07/2023)  Depression (PHQ2-9): Low Risk  (10/18/2020)  Financial Resource Strain: Medium Risk (09/21/2020)  Tobacco Use: Low Risk  (08/14/2023)    Readmission Risk Interventions     No data to display

## 2023-08-17 NOTE — Assessment & Plan Note (Addendum)
08-17-2023 continues with wound vac. Will need SNF at discharge.  08-19-2023 needs SNF at discharge.  08-20-2023 spoke to pt's wife Lamar Laundry today after 4 days of attempting to contact her.  Have never seen the wife at the hospital. Wife is adamant that pt is not going to SNF at discharge. Wife states that pt is going home at discharge and she will provide care. She does not want home PT/OT. Only wants referral to outpatient PT.  08-21-2023 add po dilaudid for pain q2h prn. CM making preparation for DC tomorrow to pt's home as pt's wife refused for pt to go to SNF at discharge.  08-22-2023 wife has been a barrier to discharge. She doesn't answer phone calls. In fact, it took this Clinical research associate 4 days to get in touch with her despite calling her every day. Wife was initially ADAMANTLY against sending pt to SNF. She demanded that pt be sent home with her. All arrangements were made for patient to go home today with various DME. Wife had refused for pt to get Home PT/OT but instead wanted him to get outpatient PT at "[facility]".  At the last moment on the close of business day on Friday, Aug 21, 2023, the wife suddenly refused to take patient home and wanted him to go to SNF.  08-23-2023 plan for DC to SNF in [place], [place]. CM involved.

## 2023-08-17 NOTE — Hospital Course (Addendum)
HPI: Earl Calderon is a 72 y.o. male with medical history significant of CKD, DM, MGUS and documented CHF in chart.  EF was 49% in May, 2024. LVH.    On review of chart, I do not notice any pulmonary pathology being documented.  Patient reports that his baseline functional status is 1 of getting "tired "after doing yard work in the house.  Otherwise he is able to get up and walk around the house without any trouble breathing.  However for the last 2 weeks patient describes a progressive sensation of shortness of breath.  Initially present with exertion and now patient states he is feeling short of breath even at rest.  This prompted the patient to come to the hospital today.  Patient further reports generalized anterior chest discomfort with efforts of breathing.  Patient does not report any fever or any pleuritic chest pain or any cough or leg swelling.   Patient's reason for coming to the hospital are as above per patient.   Further patient reports that he has had chronic leg "sores" for several weeks time, they have been worsening on both lower extremities for the last couple of weeks.  And they are more painful.   Further report is obtained from the ER provider.  Asher Muir Barrett reports that the patient was found to be hypoxic on attempts at ambulation.  However he is not hypoxic at rest.  Oxygen has been given as needed.  Troponins have been mildly elevated, medical evaluation is sought. Review of Systems: Unable to review all systems due to lack of cooperation from patient.  Patient wanted to not answer more for questions or engage in prolonged encounter.  Therefore review of system was not possible similarly exam was limited.  Antibiotic Therapy: Anti-infectives (From admission, onward)    Start     Dose/Rate Route Frequency Ordered Stop   08/14/23 1015  vancomycin (VANCOCIN) powder  Status:  Discontinued          As needed 08/14/23 1015 08/14/23 1035   08/14/23 0815  ceFAZolin (ANCEF)  IVPB 2g/100 mL premix        2 g 200 mL/hr over 30 Minutes Intravenous On call to O.R. 08/14/23 0729 08/14/23 1012   08/11/23 0800  cefTRIAXone (ROCEPHIN) 1 g in sodium chloride 0.9 % 100 mL IVPB        1 g 200 mL/hr over 30 Minutes Intravenous Daily 08/11/23 0559 08/15/23 2359   08/10/23 2200  vancomycin (VANCOCIN) IVPB 1000 mg/200 mL premix  Status:  Discontinued        1,000 mg 200 mL/hr over 60 Minutes Intravenous Every 48 hours 08/09/23 1047 08/10/23 1206   08/10/23 1206  vancomycin variable dose per unstable renal function (pharmacist dosing)  Status:  Discontinued         Does not apply See admin instructions 08/10/23 1206 08/10/23 1218   08/10/23 0600  cefUROXime (ZINACEF) 1.5 g in sodium chloride 0.9 % 100 mL IVPB        1.5 g 200 mL/hr over 30 Minutes Intravenous On call to O.R. 08/09/23 0846 08/11/23 0559   08/09/23 1400  piperacillin-tazobactam (ZOSYN) IVPB 2.25 g  Status:  Discontinued        2.25 g 100 mL/hr over 30 Minutes Intravenous Every 8 hours 08/09/23 1050 08/11/23 0557   08/08/23 2200  vancomycin (VANCOREADY) IVPB 750 mg/150 mL  Status:  Discontinued        750 mg 150 mL/hr over 60 Minutes Intravenous Every 24  hours 08/08/23 0015 08/09/23 1047   08/07/23 2330  Vancomycin (VANCOCIN) 1,500 mg in sodium chloride 0.9 % 500 mL IVPB        1,500 mg 250 mL/hr over 120 Minutes Intravenous  Once 08/07/23 2240 08/08/23 0740   08/07/23 2300  piperacillin-tazobactam (ZOSYN) IVPB 3.375 g  Status:  Discontinued        3.375 g 12.5 mL/hr over 240 Minutes Intravenous Every 8 hours 08/07/23 2240 08/09/23 1050   08/07/23 1715  cefTRIAXone (ROCEPHIN) 1 g in sodium chloride 0.9 % 100 mL IVPB        1 g 200 mL/hr over 30 Minutes Intravenous  Once 08/07/23 1707 08/07/23 1848       Procedures: 08-10-2023 Aortogram with LE angiogram 08-14-2023 Bilateral BKA  Consultants: Ortho nephrology

## 2023-08-17 NOTE — Plan of Care (Signed)
  Problem: Education: Goal: Ability to describe self-care measures that may prevent or decrease complications (Diabetes Survival Skills Education) will improve Outcome: Progressing Goal: Individualized Educational Video(s) Outcome: Progressing   Problem: Coping: Goal: Ability to adjust to condition or change in health will improve Outcome: Not Progressing   Problem: Fluid Volume: Goal: Ability to maintain a balanced intake and output will improve Outcome: Progressing

## 2023-08-18 DIAGNOSIS — I10 Essential (primary) hypertension: Secondary | ICD-10-CM | POA: Diagnosis not present

## 2023-08-18 DIAGNOSIS — J9601 Acute respiratory failure with hypoxia: Secondary | ICD-10-CM | POA: Diagnosis not present

## 2023-08-18 DIAGNOSIS — R410 Disorientation, unspecified: Secondary | ICD-10-CM

## 2023-08-18 DIAGNOSIS — N1831 Chronic kidney disease, stage 3a: Secondary | ICD-10-CM

## 2023-08-18 DIAGNOSIS — N179 Acute kidney failure, unspecified: Secondary | ICD-10-CM | POA: Diagnosis not present

## 2023-08-18 LAB — TYPE AND SCREEN
ABO/RH(D): O POS
Antibody Screen: NEGATIVE
Unit division: 0
Unit division: 0
Unit division: 0

## 2023-08-18 LAB — GLUCOSE, CAPILLARY
Glucose-Capillary: 147 mg/dL — ABNORMAL HIGH (ref 70–99)
Glucose-Capillary: 100 mg/dL — ABNORMAL HIGH (ref 70–99)
Glucose-Capillary: 157 mg/dL — ABNORMAL HIGH (ref 70–99)
Glucose-Capillary: 216 mg/dL — ABNORMAL HIGH (ref 70–99)

## 2023-08-18 LAB — BPAM RBC
Blood Product Expiration Date: 202501032359
Blood Product Expiration Date: 202501012359
Blood Product Expiration Date: 202501012359
ISSUE DATE / TIME: 202412150623
ISSUE DATE / TIME: 202412170819
Unit Type and Rh: 202501012359
Unit Type and Rh: 202501032359
Unit Type and Rh: 5100
Unit Type and Rh: 5100
Unit Type and Rh: 5100

## 2023-08-18 MED ORDER — HYDRALAZINE HCL 20 MG/ML IJ SOLN
INTRAMUSCULAR | Status: AC
  Filled 2023-08-18: qty 1

## 2023-08-18 MED ORDER — ENOXAPARIN SODIUM 40 MG/0.4ML IJ SOSY
40.0000 mg | PREFILLED_SYRINGE | INTRAMUSCULAR | Status: DC
  Administered 2023-08-19 – 2023-09-03 (×16): 40 mg via SUBCUTANEOUS
  Filled 2023-08-18 (×16): qty 0.4

## 2023-08-18 MED ORDER — HYDRALAZINE HCL 25 MG PO TABS
25.0000 mg | ORAL_TABLET | Freq: Three times a day (TID) | ORAL | Status: DC
  Administered 2023-08-18 – 2023-08-24 (×17): 25 mg via ORAL
  Filled 2023-08-18 (×18): qty 1

## 2023-08-18 MED ORDER — QUETIAPINE FUMARATE 25 MG PO TABS
50.0000 mg | ORAL_TABLET | Freq: Every evening | ORAL | Status: DC
  Administered 2023-08-18 – 2023-08-27 (×10): 50 mg via ORAL
  Filled 2023-08-18 (×10): qty 2

## 2023-08-18 MED ORDER — HYDRALAZINE HCL 20 MG/ML IJ SOLN
20.0000 mg | INTRAMUSCULAR | Status: AC
  Administered 2023-08-18: 20 mg via INTRAVENOUS

## 2023-08-18 MED ORDER — LORAZEPAM 2 MG/ML IJ SOLN
1.0000 mg | INTRAMUSCULAR | Status: AC
  Administered 2023-08-18: 1 mg via INTRAVENOUS

## 2023-08-18 MED ORDER — LORAZEPAM 2 MG/ML IJ SOLN: 1.0000 mg | Freq: Once | INTRAMUSCULAR | Status: DC

## 2023-08-18 MED ORDER — HYDRALAZINE HCL 20 MG/ML IJ SOLN: 10.0000 mg | INTRAMUSCULAR | Status: DC

## 2023-08-18 NOTE — Progress Notes (Signed)
Mechanical unwitnessed fall Hypertensive urgency Agitation/confusion - Patient had an eyedrop witnessed fall at his room.  He was found agitated and taking his IV access out and heart monitor already has been taken at the patient.  Patient denies any injury of his head.  He is alert oriented x 4.  Per chart review patient has bilateral BKA on 12/13.  He reported he lost his balance and had a fall.  Does not have any active bleeding from the surgical site. -Given patient is agitated and very anxious giving Ativan 1 mg stat. - Blood pressure elevated 218/115.  Giving hydralazine 20 mg IV stat 1 dose. - Continue hydralazine 20 mg every 6 hour as needed for SBP above 180. -Continue lorazepam 1 mg every 6 hour as needed for anxiety or agitation. - Continue fall precaution - Activate the bedside alarm. -Continue delirium precaution

## 2023-08-18 NOTE — Progress Notes (Signed)
OT Cancellation Note  Patient Details Name: Earl Calderon MRN: [id number] DOB: 1951/07/31   Cancelled Treatment:    Reason Eval/Treat Not Completed: Fatigue/lethargy limiting ability to participate;Other (comment) Noted overnight events with fall/agitation. Pt currently sleeping and did not awaken to voice on entry. Will hold OT efforts and follow up later as schedule permits.  Lorre Munroe 08/18/2023, 8:40 AM

## 2023-08-18 NOTE — Progress Notes (Signed)
Occupational Therapy Treatment Patient Details Name: Earl Calderon MRN: [id number] DOB: [birth year] Today's Date: 08/18/2023   History of present illness Patient is a 72 yo male presenting to the ED with SOB and fatigue on 08/07/23. Admitted with acute respiratory failure with hypoxia. Found to have bilateral lower extremity wounds, gangrene, chronic limb ischemia. Arteriogram performed on 12/9. 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  PMH includes: CAD, CKD stage III, DM type II, uncontrolled, MGUS, chronic systolic CHF, NSVT, resistant hypertension, hyperlipidemia   OT comments  On entry, pt had pulled Primofit off and working on removing mitts. Pt restless, frustrated by feeling "tied down" and asking to remove "tape" off of B LE (referencing wound vacs). Pt able to be redirected for basic bathing/dressing bed level as pt soiled and not clothed on entry. However, difficult to fully engage in session due to impaired cognition, often requiring reorientation to B BKAs and wound vacs. Patient will benefit from continued inpatient follow up therapy, <3 hours/day at DC.      If plan is discharge home, recommend the following:  A lot of help with walking and/or transfers;A lot of help with bathing/dressing/bathroom;Assistance with cooking/housework;Direct supervision/assist for medications management;Direct supervision/assist for financial management;Assist for transportation;Help with stairs or ramp for entrance;Supervision due to cognitive status;Two people to help with walking and/or transfers   Equipment Recommendations  Wheelchair cushion (measurements OT);Wheelchair (measurements OT);Other (comment) (TBD)    Recommendations for Other Services      Precautions / Restrictions Precautions Precautions: Fall;Other (comment) Precaution Comments: wound vacs BLE Restrictions Weight Bearing Restrictions Per Provider Order: Yes RLE Weight Bearing Per Provider Order: Non weight bearing LLE Weight  Bearing Per Provider Order: Non weight bearing       Mobility Bed Mobility Overal bed mobility: Needs Assistance Bed Mobility: Rolling Rolling: Min assist, Mod assist         General bed mobility comments: Min-Mod A for full roll to change bed pad. able to long sit in bed for ADLs without assist. deferred EOB due to pt behaviors without +2 assist    Transfers                   General transfer comment: deferred d/t behaviors     Balance                                           ADL either performed or assessed with clinical judgement   ADL Overall ADL's : Needs assistance/impaired     Grooming: Supervision/safety;Bed level;Wash/dry face Grooming Details (indicate cue type and reason): sitting in bed Upper Body Bathing: Minimal assistance;Bed level Upper Body Bathing Details (indicate cue type and reason): assist to bathe back. pt able to bathe under arms and chest with cues for attention to task     Upper Body Dressing : Minimal assistance;Bed level Upper Body Dressing Details (indicate cue type and reason): donning clean gown as pt had pulled prior gown off                        Extremity/Trunk Assessment Upper Extremity Assessment Upper Extremity Assessment: Overall WFL for tasks assessed;Right hand dominant   Lower Extremity Assessment Lower Extremity Assessment: Defer to PT evaluation        Vision   Vision Assessment?: Wears glasses for reading   Perception     Praxis  Cognition Arousal: Alert Behavior During Therapy: Restless, Impulsive Overall Cognitive Status: No family/caregiver present to determine baseline cognitive functioning                                 General Comments: A&Ox1, alert on entry, able to follow commands but requires frequent redirection and reorientation. asking to take off the "tape" on my legs (wound vacs), asking where his legs are and says he wants them back. wanted  mitts off - working on pulling them off on OT entry- easily agitated and paranoid regarding staff intentions to help        Exercises      Shoulder Instructions       General Comments      Pertinent Vitals/ Pain       Pain Assessment Pain Assessment: Faces Faces Pain Scale: Hurts little more Pain Location: BLE Pain Descriptors / Indicators: Moaning, Grimacing, Guarding Pain Intervention(s): Monitored during session  Home Living                                          Prior Functioning/Environment              Frequency  Min 1X/week        Progress Toward Goals  OT Goals(current goals can now be found in the care plan section)  Progress towards OT goals: OT to reassess next treatment  Acute Rehab OT Goals Patient Stated Goal: get my legs back OT Goal Formulation: Patient unable to participate in goal setting Time For Goal Achievement: 08/29/23 Potential to Achieve Goals: Fair ADL Goals Pt Will Perform Lower Body Bathing: with min assist;sitting/lateral leans Pt Will Perform Lower Body Dressing: with min assist;sitting/lateral leans Pt Will Transfer to Toilet: with mod assist;with transfer board;bedside commode Pt Will Perform Toileting - Clothing Manipulation and hygiene: with min assist;sitting/lateral leans Additional ADL Goal #1: Patient will complete bed mobility at CGA level as a precursor to OOB activities.  Plan      Co-evaluation                 AM-PAC OT "6 Clicks" Daily Activity     Outcome Measure   Help from another person eating meals?: None Help from another person taking care of personal grooming?: A Little Help from another person toileting, which includes using toliet, bedpan, or urinal?: A Lot Help from another person bathing (including washing, rinsing, drying)?: A Lot Help from another person to put on and taking off regular upper body clothing?: A Little Help from another person to put on and taking off  regular lower body clothing?: A Lot 6 Click Score: 16    End of Session    OT Visit Diagnosis: Unsteadiness on feet (R26.81);Other abnormalities of gait and mobility (R26.89);Other symptoms and signs involving cognitive function   Activity Tolerance Other (comment) (limited by cognition)   Patient Left in bed;with call bell/phone within reach;with bed alarm set;Other (comment) (RN in to assist; ok of trial without mitts as pt would not allow OT to place them back on; obtaining telesitter)   Nurse Communication Mobility status;Other (comment) (RN in to assess behaviors)        Time: 4696-2952 OT Time Calculation (min): 26 min  Charges: OT General Charges $OT Visit: 1 Visit OT Treatments $Self Care/Home Management : 23-37  mins  Bradd Canary, OTR/L Acute Rehab Services Office: [phone]   Lorre Munroe 08/18/2023, 11:47 AM

## 2023-08-18 NOTE — Plan of Care (Signed)
  Problem: Education: Goal: Ability to describe self-care measures that may prevent or decrease complications (Diabetes Survival Skills Education) will improve Outcome: Progressing Goal: Individualized Educational Video(s) Outcome: Progressing   Problem: Health Behavior/Discharge Planning: Goal: Ability to identify and utilize available resources and services will improve Outcome: Progressing Goal: Ability to manage health-related needs will improve Outcome: Progressing   Problem: Nutritional: Goal: Maintenance of adequate nutrition will improve Outcome: Progressing Goal: Progress toward achieving an optimal weight will improve Outcome: Progressing   Problem: Skin Integrity: Goal: Risk for impaired skin integrity will decrease Outcome: Progressing   Problem: Health Behavior/Discharge Planning: Goal: Ability to manage health-related needs will improve Outcome: Progressing

## 2023-08-18 NOTE — Progress Notes (Signed)
Patient being transferred to 5N room 9 per order. Attempted to notify patients wife of transfer but she did not answer. Will continue to try and notify.

## 2023-08-18 NOTE — Assessment & Plan Note (Addendum)
08-18-2023 restart scheduled hydralazine 25 mg tid.  08-19-2023 BP stable on hydralazine 25 mg tid.  08-21-2023 BP elevated but may be due to pain. Add prn labetalol. Monitor BP. Start isordil 20 mg bid to get his back on some CHF meds.  08-22-2023 will get labs today and decided about taking coreg 3.125 mg bid.  08-23-2023 BP stable. On coreg 3.125 mg bid, isordil 20 mg bid, hydralazine 25 mg tid.

## 2023-08-18 NOTE — TOC Progression Note (Signed)
Transition of Care [facility]) - Progression Note    Patient Details  Name: Earl Calderon MRN: [id number] Date of Birth: 05-Nov-1950  Transition of Care [facility]) CM/SW Contact  Eduard Roux, [place] Phone Number: 08/18/2023, 11:01 AM  Clinical Narrative:     TOC following- will provide bed offers to patient once patient more alert and able to  approprietly engage in disposition plan.   Antony Blackbird, MSW, LCSW Clinical Social Worker    Expected Discharge Plan: Skilled Nursing Facility (to be determined post op) Barriers to Discharge: Continued Medical Work up  Expected Discharge Plan and Services In-house Referral: Clinical Social Work                                             Social Determinants of Health (SDOH) Interventions SDOH Screenings   Food Insecurity: No Food Insecurity (08/07/2023)  Housing: Low Risk  (08/07/2023)  Transportation Needs: No Transportation Needs (08/07/2023)  Utilities: Not At Risk (08/07/2023)  Depression (PHQ2-9): Low Risk  (10/18/2020)  Financial Resource Strain: Medium Risk (09/21/2020)  Tobacco Use: Low Risk  (08/14/2023)    Readmission Risk Interventions     No data to display

## 2023-08-18 NOTE — TOC Progression Note (Signed)
Transition of Care [facility]) - Progression Note    Patient Details  Name: Earl Calderon MRN: [id number] Date of Birth: [birth year]  Transition of Care [facility]) CM/SW Contact  Eduard Roux, [place] Phone Number: 08/18/2023, 12:04 PM  Clinical Narrative:     Mickel Crow- left voice message to review referral for rehab (yesterday, patient stated he wanted a SNF near his home)- waiting on response.  Antony Blackbird, MSW, LCSW Clinical Social Worker    Expected Discharge Plan: Skilled Nursing Facility (to be determined post op) Barriers to Discharge: Continued Medical Work up  Expected Discharge Plan and Services In-house Referral: Clinical Social Work                                             Social Determinants of Health (SDOH) Interventions SDOH Screenings   Food Insecurity: No Food Insecurity (08/07/2023)  Housing: Low Risk  (08/07/2023)  Transportation Needs: No Transportation Needs (08/07/2023)  Utilities: Not At Risk (08/07/2023)  Depression (PHQ2-9): Low Risk  (10/18/2020)  Financial Resource Strain: Medium Risk (09/21/2020)  Tobacco Use: Low Risk  (08/14/2023)    Readmission Risk Interventions     No data to display

## 2023-08-18 NOTE — Progress Notes (Signed)
Pt pulled his heart monitor out and did not let staff nurse put the heart monitor back on.  He has tried to get out of bed and pull his IV and wound vac. We will put a soft mittens on and request for a safety sitter at this time.   Filiberto Pinks RN

## 2023-08-18 NOTE — Assessment & Plan Note (Addendum)
08-18-2023 probably some hospital acquired delirium. Start scheduled seroquel 50 mg at 8 pm to head off any sun downing.  08-19-2023 received 50 mg seroquel at 8:12 pm last night. No reported overnight agitation. Continue with seroquel.  08-20-2023 continue with 50 mg seroquel at 8 pm. Will add prn IM geodon. Add am dose of seroquel 25 mg.  08-21-2023 seems to have resolved. Continue with seroquel 50 mg q 8 pm. Monitor how his AM dose of seroquel works.  08-23-2023 pt's delirium improving on scheduled AM(25 mg) and PM(50 mg) Seroquel. Continue.

## 2023-08-18 NOTE — Progress Notes (Signed)
PROGRESS NOTE    Earl Calderon  YQM:[id number] DOB: 1951/05/29 DOA: 08/07/2023 PCP: Melvenia Beam, MD  Subjective: Pt seen and examined. Pt is s/p bilateral trans-tibial amputation on 08-14-2023.  Events of last night noted. Pt agitated, had fall, uncontrolled htn.  RN reports pt agitated overnight. Pt wearing bilateral hand mittens. Attempted to call wife again at [phone] no answer.  Have ordered 50 mg seroquel at 8 pm in order to head off any night time delirium.   Hospital Course: HPI: Earl Calderon is a 72 y.o. male with medical history significant of CKD, DM, MGUS and documented CHF in chart.  EF was 49% in May, 2024. LVH.    On review of chart, I do not notice any pulmonary pathology being documented.  Patient reports that his baseline functional status is 1 of getting "tired "after doing yard work in the house.  Otherwise he is able to get up and walk around the house without any trouble breathing.  However for the last 2 weeks patient describes a progressive sensation of shortness of breath.  Initially present with exertion and now patient states he is feeling short of breath even at rest.  This prompted the patient to come to the hospital today.  Patient further reports generalized anterior chest discomfort with efforts of breathing.  Patient does not report any fever or any pleuritic chest pain or any cough or leg swelling.   Patient's reason for coming to the hospital are as above per patient.   Further patient reports that he has had chronic leg "sores" for several weeks time, they have been worsening on both lower extremities for the last couple of weeks.  And they are more painful.   Further report is obtained from the ER provider.  Earl Calderon reports that the patient was found to be hypoxic on attempts at ambulation.  However he is not hypoxic at rest.  Oxygen has been given as needed.  Troponins have been mildly elevated, medical evaluation is  sought. Review of Systems: Unable to review all systems due to lack of cooperation from patient.  Patient wanted to not answer more for questions or engage in prolonged encounter.  Therefore review of system was not possible similarly exam was limited.  Antibiotic Therapy: Anti-infectives (From admission, onward)    Start     Dose/Rate Route Frequency Ordered Stop   08/14/23 1015  vancomycin (VANCOCIN) powder  Status:  Discontinued          As needed 08/14/23 1015 08/14/23 1035   08/14/23 0815  ceFAZolin (ANCEF) IVPB 2g/100 mL premix        2 g 200 mL/hr over 30 Minutes Intravenous On call to O.R. 08/14/23 0729 08/14/23 1012   08/11/23 0800  cefTRIAXone (ROCEPHIN) 1 g in sodium chloride 0.9 % 100 mL IVPB        1 g 200 mL/hr over 30 Minutes Intravenous Daily 08/11/23 0559 08/15/23 2359   08/10/23 2200  vancomycin (VANCOCIN) IVPB 1000 mg/200 mL premix  Status:  Discontinued        1,000 mg 200 mL/hr over 60 Minutes Intravenous Every 48 hours 08/09/23 1047 08/10/23 1206   08/10/23 1206  vancomycin variable dose per unstable renal function (pharmacist dosing)  Status:  Discontinued         Does not apply See admin instructions 08/10/23 1206 08/10/23 1218   08/10/23 0600  cefUROXime (ZINACEF) 1.5 g in sodium chloride 0.9 % 100 mL IVPB  1.5 g 200 mL/hr over 30 Minutes Intravenous On call to O.R. 08/09/23 0846 08/11/23 0559   08/09/23 1400  piperacillin-tazobactam (ZOSYN) IVPB 2.25 g  Status:  Discontinued        2.25 g 100 mL/hr over 30 Minutes Intravenous Every 8 hours 08/09/23 1050 08/11/23 0557   08/08/23 2200  vancomycin (VANCOREADY) IVPB 750 mg/150 mL  Status:  Discontinued        750 mg 150 mL/hr over 60 Minutes Intravenous Every 24 hours 08/08/23 0015 08/09/23 1047   08/07/23 2330  Vancomycin (VANCOCIN) 1,500 mg in sodium chloride 0.9 % 500 mL IVPB        1,500 mg 250 mL/hr over 120 Minutes Intravenous  Once 08/07/23 2240 08/08/23 0740   08/07/23 2300  piperacillin-tazobactam  (ZOSYN) IVPB 3.375 g  Status:  Discontinued        3.375 g 12.5 mL/hr over 240 Minutes Intravenous Every 8 hours 08/07/23 2240 08/09/23 1050   08/07/23 1715  cefTRIAXone (ROCEPHIN) 1 g in sodium chloride 0.9 % 100 mL IVPB        1 g 200 mL/hr over 30 Minutes Intravenous  Once 08/07/23 1707 08/07/23 1848       Procedures: 08-10-2023 Aortogram with LE angiogram 08-14-2023 Bilateral BKA  Consultants: Ortho nephrology    Assessment and Plan: * Acute respiratory failure with hypoxia (HCC) On admission, Patient had presented initially for shortness of breath and hypoxia. -Unclear etiology, CTA chest showed no evidence of pulmonary embolus, aortic atherosclerosis, coronary artery calcifications.  No pleural effusion or pneumothorax. -2D echo still pending -O2 sats 100% on 2 L  08-17-2023 pt the time I saw the patient today, his 2D echo showed EF of 55 to 60%, no regional WMA, G2 DD Stable, O2 sats 99% on room air  08-18-2023 on RA. Resolved.  Delirium 08-18-2023 probably some hospital acquired delirium. Start scheduled seroquel 50 mg at 8 pm to head off any sun downing.  Hypoxia On admission through 12-15, 2024, This is actually the patient's principal reason for presentation.  Patient reports last couple of weeks of exertional shortness of breath that is progressive and now short of breath even at rest.  CT PE study is actually negative and I do not find marked fluid overload on the limited exam that I could do for the patient before patient interrupted the encounter.  Per report patient was actually hypoxic on ambulation but not at rest.  Given the current state of workup, residual concerns include possible pulmonary artery hypertension, pleural effusion or other limitation of cardiac output.  Especially given the troponin elevation I will go ahead and proceed with an echo at this time and see where that leads [place].  Continue with as needed oxygen.  08-17-2023 resolved.  Acute renal  failure superimposed on stage 3a chronic kidney disease (HCC) On admission through 12-15, 2024, Patient baseline creatinine between 1.06 and 1.31.  Current creatinine 1.78.  Without associated elevated BUN.  I suspect this may be related to infection and hyperglycemia.  Patient received 500 cc normal saline fluid bolus in the ER.  Check urinalysis sodium creatinine.  Trend creatinine response to 75 cc/h fluid hydration overnight  08-17-2023 Scr 1.49. at baseline.  Gangrene of both feet (HCC) On admission through 12-15, 2024, , Of b/l lower extermity - foot and ankle. Patient has foul-smelling slightly wet appearing black margin ulcers, see picture in media section of the chart.  Concerning for wet gangrene in this patient with uncontrolled diabetes mellitus  and AKI. No  sepsis. Slight lactic acidosis POA, resolved. Patient has had ankle-brachial index evaluation done, see separate study in the CV procedure section.  Patient has abnormal right and left toe to brachial index.  At this time time MRI is pending to rule out deep osteomyelitis versus occult abscess in the feet.  Patient is s/p Rocephin.  I will draw blood cultures and start the patient on vancomycin and Zosyn.  Patient will need surgical input given the gangrene in his feet. I discussed case with Earl Humble PA of vascular surgeon. Dr. Hetty Calderon is in the OR (he was indirectly made aware of the consult).  08-17-2023 pt is s/p bilateral BKA(trans-tibial). Will need SNF. No need for abx. He has surgical cure of his gangrene.  Essential hypertension 08-18-2023 restart scheduled hydralazine 25 mg tid.  Acute postoperative anemia due to expected blood loss 08-17-2023 had 1 unit PRBC transfused on 08-16-2023.  S/P bilateral BKA (below knee amputation) (HCC) - on 08/14/2023 08-17-2023 continues with wound vac. Will need SNF at discharge.  MGUS (monoclonal gammopathy of unknown significance) Last onc note from 12/25/2022 : "has what I would consider  smoldering myeloma.  He has an IgG kappa spike. To me, I still think that his diabetes can be a much bigger problem than myeloma will be. Hopefully, he will be able to get the blood sugar under control. We will have to follow him up.  We will have to get him back in about 2 or 3 months for follow-up. Hopefully, his blood sugars would not cause problems for him that we will end him up in the hospital."  08-17-2023 stable.  Type 2 diabetes mellitus with chronic kidney disease, with long-term current use of insulin (HCC) On admission, Associated with hyperglycemia, without evidence of DKA.  Glucose on presentation was 525.  Likely worsened by infection and possible nonadherence to regimen.  Patient received 10 units of subcutaneous insulin in the ER.  I will continue with patient's remotely documented Levemir 8 units twice daily as well as insulin sliding scale ACHS moderate.  We will trend this.  08-17-2023 on bid levemir and SSI. CBG in mid 200s. Will increase levemir to 10 units BID.   Chronic combined systolic and diastolic heart failure (HCC) Chronically documented. [place] does not appear to be fluids overloaded at this time. Med rec pending pharmacy input. Trop of 127 at presentation now down to 39 - over all presentation not consistnt with ACS. EKG showing stable LVH changes since 12/29/2022. See complaint of SOB/hypoxia as well.       DVT prophylaxis: SCD's Start: 08/14/23 1124 enoxaparin (LOVENOX) injection 30 mg Start: 08/09/23 1000 SCDs Start: 08/07/23 2043     Code Status: Full Code Family Communication: attempted to call wife at [phone]. No answer. This is the 2nd day I've attempted to reach wife and she has not answered her phone Disposition Plan: SNF Reason for continuing need for hospitalization: medically stable for DC.  Objective: Vitals:   08/18/23 0320 08/18/23 0340 08/18/23 0400 08/18/23 0600  BP: (!) 198/81 134/63 (!) 113/53 132/60  Pulse: 67 61 60 (!) 57   Resp: 10 14 12 18   Temp:  98.3 F (36.8 C)    TempSrc:  Oral    SpO2: 98% 95% 95% 95%  Weight:      Height:        Intake/Output Summary (Last 24 hours) at 08/18/2023 1150 Last data filed at 08/17/2023 2019 Gross per 24 hour  Intake 730 ml  Output 700 ml  Net 30 ml   Filed Weights   08/07/23 2223 08/14/23 0813  Weight: 64.9 kg 68 kg    Examination:  Physical Exam Vitals and nursing note reviewed.  Constitutional:      General: He is not in acute distress.    Appearance: He is not toxic-appearing or diaphoretic.     Comments: Confused. Wearing bilateral hand mittens. On RA Not wearing any clothes. Had to cover his genital with a towel.  HENT:     Head: Normocephalic.     Nose: Nose normal.  Cardiovascular:     Rate and Rhythm: Normal rate and regular rhythm.  Abdominal:     General: Abdomen is flat.  Musculoskeletal:     Comments: Bilateral BKAs. Wound vac attached to both stumps  Skin:    General: Skin is warm and dry.  Neurological:     Mental Status: He is disoriented.     Data Reviewed: I have personally reviewed following labs and imaging studies  CBC: Recent Labs  Lab 08/12/23 0334 08/13/23 0250 08/14/23 0344 08/16/23 0352 08/16/23 1040 08/17/23 0321  WBC 10.8* 9.8 8.4 9.6  --  8.2  HGB 7.9* 8.1* 8.1* 6.5* 7.4* 7.4*  HCT 24.1* 25.1* 24.7* 19.8* 22.1* 22.3*  MCV 93.1 93.7 92.9 94.3  --  93.3  PLT 306 357 371 372  --  351   Basic Metabolic Panel: Recent Labs  Lab 08/12/23 0334 08/13/23 0250 08/14/23 0344 08/16/23 0352 08/17/23 0321  NA 139 140 139 133* 136  K 3.4* 3.6 3.6 4.3 4.5  CL 107 109 108 104 105  CO2 23 24 24 24 24   GLUCOSE 114* 135* 218* 229* 145*  BUN 48* 48* 40* 41* 32*  CREATININE 6.29* 5.28* 3.40* 2.17* 1.49*  CALCIUM 7.4* 7.7* 7.7* 7.5* 7.5*   GFR: Estimated Creatinine Clearance: 44.4 mL/min (A) (by C-G formula based on SCr of 1.49 mg/dL (H)). BNP (last 3 results) Recent Labs    08/07/23 1128  BNP 200.5*    CBG: Recent Labs  Lab 08/17/23 0618 08/17/23 1232 08/17/23 1757 08/17/23 2120 08/18/23 0548  GLUCAP 209* 248* 244* 208* 147*    Recent Results (from the past 240 hours)  Surgical pcr screen     Status: None   Collection Time: 08/13/23  6:18 PM   Specimen: Nasal Mucosa; Nasal Swab  Result Value Ref Range Status   MRSA, PCR NEGATIVE NEGATIVE Final   Staphylococcus aureus NEGATIVE NEGATIVE Final    Comment: (NOTE) The Xpert SA Assay (FDA approved for NASAL specimens in patients 26 years of age and older), is one component of a comprehensive surveillance program. It is not intended to diagnose infection nor to guide or monitor treatment. Performed at [facility], [facility], [address] [place], [place] [address]      Radiology Studies: No results found.  Scheduled Meds:  vitamin C  1,000 mg Oral Daily   aspirin EC  81 mg Oral Daily   enoxaparin (LOVENOX) injection  30 mg Subcutaneous Q24H   hydrALAZINE  25 mg Oral Q8H   insulin aspart  0-6 Units Subcutaneous TID WC   insulin detemir  10 Units Subcutaneous BID   lidocaine  1 patch Transdermal Q24H   nutrition supplement (JUVEN)  1 packet Oral BID BM   QUEtiapine  50 mg Oral QPM   sodium chloride flush  3 mL Intravenous Q12H   zinc sulfate (50mg  elemental zinc)  220 mg Oral Daily  Continuous Infusions:  magnesium sulfate bolus IVPB       LOS: 11 days   Time spent: 40 minutes  Carollee Herter, DO  [facility]  08/18/2023, 11:50 AM
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
## 2023-08-18 NOTE — Plan of Care (Signed)
  Problem: Education: Goal: Ability to describe self-care measures that may prevent or decrease complications (Diabetes Survival Skills Education) will improve Outcome: Progressing Goal: Individualized Educational Video(s) Outcome: Progressing   Problem: Coping: Goal: Ability to adjust to condition or change in health will improve Outcome: Progressing   Problem: Fluid Volume: Goal: Ability to maintain a balanced intake and output will improve Outcome: Progressing   Problem: Health Behavior/Discharge Planning: Goal: Ability to identify and utilize available resources and services will improve Outcome: Progressing Goal: Ability to manage health-related needs will improve Outcome: Progressing   Problem: Metabolic: Goal: Ability to maintain appropriate glucose levels will improve Outcome: Progressing   Problem: Nutritional: Goal: Maintenance of adequate nutrition will improve Outcome: Progressing Goal: Progress toward achieving an optimal weight will improve Outcome: Progressing   Problem: Skin Integrity: Goal: Risk for impaired skin integrity will decrease Outcome: Progressing   Problem: Tissue Perfusion: Goal: Adequacy of tissue perfusion will improve Outcome: Progressing   Problem: Education: Goal: Knowledge of General Education information will improve Description: Including pain rating scale, medication(s)/side effects and non-pharmacologic comfort measures Outcome: Progressing   Problem: Health Behavior/Discharge Planning: Goal: Ability to manage health-related needs will improve Outcome: Progressing   Problem: Clinical Measurements: Goal: Ability to maintain clinical measurements within normal limits will improve Outcome: Progressing Goal: Will remain free from infection Outcome: Progressing Goal: Diagnostic test results will improve Outcome: Progressing Goal: Respiratory complications will improve Outcome: Progressing Goal: Cardiovascular complication will  be avoided Outcome: Progressing   Problem: Activity: Goal: Risk for activity intolerance will decrease Outcome: Progressing   Problem: Nutrition: Goal: Adequate nutrition will be maintained Outcome: Progressing   Problem: Coping: Goal: Level of anxiety will decrease Outcome: Progressing   Problem: Elimination: Goal: Will not experience complications related to bowel motility Outcome: Progressing Goal: Will not experience complications related to urinary retention Outcome: Progressing   Problem: Pain Management: Goal: General experience of comfort will improve Outcome: Progressing   Problem: Safety: Goal: Ability to remain free from injury will improve Outcome: Progressing   Problem: Skin Integrity: Goal: Risk for impaired skin integrity will decrease Outcome: Progressing   Problem: Education: Goal: Knowledge of the prescribed therapeutic regimen will improve Outcome: Progressing Goal: Ability to verbalize activity precautions or restrictions will improve Outcome: Progressing Goal: Understanding of discharge needs will improve Outcome: Progressing   Problem: Activity: Goal: Ability to perform//tolerate increased activity and mobilize with assistive devices will improve Outcome: Progressing   Problem: Clinical Measurements: Goal: Postoperative complications will be avoided or minimized Outcome: Progressing   Problem: Self-Care: Goal: Ability to meet self-care needs will improve Outcome: Progressing   Problem: Self-Concept: Goal: Ability to maintain and perform role responsibilities to the fullest extent possible will improve Outcome: Progressing   Problem: Pain Management: Goal: Pain level will decrease with appropriate interventions Outcome: Progressing   Problem: Skin Integrity: Goal: Demonstration of wound healing without infection will improve Outcome: Progressing

## 2023-08-19 DIAGNOSIS — I10 Essential (primary) hypertension: Secondary | ICD-10-CM | POA: Diagnosis not present

## 2023-08-19 DIAGNOSIS — D62 Acute posthemorrhagic anemia: Secondary | ICD-10-CM | POA: Diagnosis not present

## 2023-08-19 DIAGNOSIS — G9341 Metabolic encephalopathy: Secondary | ICD-10-CM

## 2023-08-19 DIAGNOSIS — I5042 Chronic combined systolic (congestive) and diastolic (congestive) heart failure: Secondary | ICD-10-CM | POA: Diagnosis not present

## 2023-08-19 DIAGNOSIS — R410 Disorientation, unspecified: Secondary | ICD-10-CM | POA: Diagnosis not present

## 2023-08-19 LAB — CBC WITH DIFFERENTIAL/PLATELET
Abs Immature Granulocytes: 0.15 10*3/uL — ABNORMAL HIGH (ref 0.00–0.07)
Basophils Absolute: 0.1 10*3/uL (ref 0.0–0.1)
Basophils Relative: 1 %
Eosinophils Absolute: 0.2 10*3/uL (ref 0.0–0.5)
Eosinophils Relative: 2 %
HCT: 24.3 % — ABNORMAL LOW (ref 39.0–52.0)
Hemoglobin: 7.6 g/dL — ABNORMAL LOW (ref 13.0–17.0)
Immature Granulocytes: 2 %
Lymphocytes Relative: 15 %
Lymphs Abs: 1.5 10*3/uL (ref 0.7–4.0)
MCH: 30.4 pg (ref 26.0–34.0)
MCHC: 31.3 g/dL (ref 30.0–36.0)
MCV: 97.2 fL (ref 80.0–100.0)
Monocytes Absolute: 0.5 10*3/uL (ref 0.1–1.0)
Monocytes Relative: 5 %
Neutro Abs: 7.3 10*3/uL (ref 1.7–7.7)
Neutrophils Relative %: 75 %
Platelets: 410 10*3/uL — ABNORMAL HIGH (ref 150–400)
RBC: 2.5 MIL/uL — ABNORMAL LOW (ref 4.22–5.81)
RDW: 16 % — ABNORMAL HIGH (ref 11.5–15.5)
WBC: 9.7 10*3/uL (ref 4.0–10.5)
nRBC: 0 % (ref 0.0–0.2)

## 2023-08-19 LAB — COMPREHENSIVE METABOLIC PANEL
ALT: 10 U/L (ref 0–44)
AST: 18 U/L (ref 15–41)
Albumin: 1.9 g/dL — ABNORMAL LOW (ref 3.5–5.0)
Alkaline Phosphatase: 43 U/L (ref 38–126)
Anion gap: 7 (ref 5–15)
BUN: 40 mg/dL — ABNORMAL HIGH (ref 8–23)
CO2: 25 mmol/L (ref 22–32)
Calcium: 8 mg/dL — ABNORMAL LOW (ref 8.9–10.3)
Chloride: 103 mmol/L (ref 98–111)
Creatinine, Ser: 1.52 mg/dL — ABNORMAL HIGH (ref 0.61–1.24)
GFR, Estimated: 49 mL/min — ABNORMAL LOW (ref >60)
Glucose, Bld: 129 mg/dL — ABNORMAL HIGH (ref 70–99)
Potassium: 4.7 mmol/L (ref 3.5–5.1)
Sodium: 135 mmol/L (ref 135–145)
Total Bilirubin: 0.8 mg/dL (ref <1.2)
Total Protein: 6.3 g/dL — ABNORMAL LOW (ref 6.5–8.1)

## 2023-08-19 LAB — IRON AND TIBC
Iron: 49 ug/dL (ref 45–182)
Saturation Ratios: 21 % (ref 17.9–39.5)
TIBC: 239 ug/dL — ABNORMAL LOW (ref 250–450)
UIBC: 190 ug/dL

## 2023-08-19 LAB — GLUCOSE, CAPILLARY
Glucose-Capillary: 81 mg/dL (ref 70–99)
Glucose-Capillary: 110 mg/dL — ABNORMAL HIGH (ref 70–99)
Glucose-Capillary: 183 mg/dL — ABNORMAL HIGH (ref 70–99)
Glucose-Capillary: 272 mg/dL — ABNORMAL HIGH (ref 70–99)

## 2023-08-19 NOTE — TOC Progression Note (Addendum)
Transition of Care [facility]) - Progression Note    Patient Details  Name: Earl Calderon MRN: [id number] Date of Birth: [birth year]  [facility] CM/SW Contact  Lorri Frederick, LCSW Phone Number: 08/19/2023, 10:37 AM  Clinical Narrative:   Bed offers provided to pt on medicare choice document.  Pt asking for SNF referral to be sent out to all options in [place].   1330: Additional bed offers provided to pt, he will review them with his wife and make decision by tomorrow.   Expected Discharge Plan: Skilled Nursing Facility (to be determined post op) Barriers to Discharge: Continued Medical Work up  Expected Discharge Plan and Services In-house Referral: Clinical Social Work                                             Social Determinants of Health (SDOH) Interventions SDOH Screenings   Food Insecurity: No Food Insecurity (08/07/2023)  Housing: Low Risk  (08/07/2023)  Transportation Needs: No Transportation Needs (08/07/2023)  Utilities: Not At Risk (08/07/2023)  Depression (PHQ2-9): Low Risk  (10/18/2020)  Financial Resource Strain: Medium Risk (09/21/2020)  Tobacco Use: Low Risk  (08/14/2023)    Readmission Risk Interventions     No data to display

## 2023-08-19 NOTE — Plan of Care (Signed)
  Problem: Education: Goal: Ability to describe self-care measures that may prevent or decrease complications (Diabetes Survival Skills Education) will improve Outcome: Progressing Goal: Individualized Educational Video(s) Outcome: Progressing   Problem: Nutritional: Goal: Maintenance of adequate nutrition will improve Outcome: Progressing Goal: Progress toward achieving an optimal weight will improve Outcome: Progressing   Problem: Skin Integrity: Goal: Risk for impaired skin integrity will decrease Outcome: Progressing   Problem: Nutrition: Goal: Adequate nutrition will be maintained Outcome: Progressing   Problem: Coping: Goal: Level of anxiety will decrease Outcome: Progressing

## 2023-08-19 NOTE — Progress Notes (Signed)
Physical Therapy Treatment Patient Details Name: Earl Calderon MRN: [id number] DOB: June 29, 1951 Today's Date: 08/19/2023   History of Present Illness Patient is a 72 yo male presenting to the ED with SOB and fatigue on 08/07/23. Admitted with acute respiratory failure with hypoxia. Found to have bilateral lower extremity wounds, gangrene, chronic limb ischemia. Arteriogram performed on 12/9. 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  PMH includes: CAD, CKD stage III, DM type II, uncontrolled, MGUS, chronic systolic CHF, NSVT, resistant hypertension, hyperlipidemia    PT Comments  Pt received in supine, agreeable to therapy session and with good participation and tolerance in supine and sidelying BLE exercises for strengthening. Pt given HEP handout to reinforce and needing up to modA for bed mobility, defer EOB/OOB mobility due to pt c/o fatigue after LE exercise. Pt instructed on likely progression of therapies. Pt continues to benefit from PT services to progress toward functional mobility goals. Plan to work on A/P transfers OOB to chair or wheelchair next session to continue progression.   If plan is discharge home, recommend the following: Two people to help with walking and/or transfers;Assistance with cooking/housework;Assist for transportation;Help with stairs or ramp for entrance;Supervision due to cognitive status   Can travel by private vehicle     No  Equipment Recommendations  Wheelchair (measurements PT);Wheelchair cushion (measurements PT);Hoyer lift;Other (comment) (ramp, drop arm BSC, slide board)    Recommendations for Other Services       Precautions / Restrictions Precautions Precautions: Fall;Other (comment) Precaution Comments: wound vacs BLE Restrictions Weight Bearing Restrictions Per Provider Order: Yes RLE Weight Bearing Per Provider Order: Non weight bearing LLE Weight Bearing Per Provider Order: Non weight bearing     Mobility  Bed Mobility Overal bed mobility:  Needs Assistance Bed Mobility: Supine to Sit, Rolling Rolling: Min assist, Mod assist, Used rails   Supine to sit: HOB elevated, Used rails, Mod assist     General bed mobility comments: Rolling fully to L/R sides requiring up to modA during sidelying exercises, pt wtih good effort but at times needs visual and verbal cues due to difficulty sequencing. Pt received lying toward his R in bed with head pushing on bed rail, but had seemed somewhat unaware. ModA to lift trunk to long sitting with bed rail/HHA due to fatigue/drowsiness.    Transfers                   General transfer comment: deferred due to pt fatigue after supine and sidelying HEP with demo back    Ambulation/Gait                   Stairs             Wheelchair Mobility     Tilt Bed    Modified Rankin (Stroke Patients Only)       Balance Overall balance assessment: Needs assistance Sitting-balance support: Bilateral upper extremity supported, Feet unsupported Sitting balance-Leahy Scale: Poor Sitting balance - Comments: posterior lean in long sitting; defer EOB due to pt c/o fatigue after supine/sidelying exercises                                    Cognition Arousal: Alert Behavior During Therapy: WFL for tasks assessed/performed Overall Cognitive Status: No family/caregiver present to determine baseline cognitive functioning  General Comments: Pt appears alert and oriented to situation/location/self (PTA did not assess time), appreciative of information. Some difficulty sequencing with bed mobility.        Exercises Other Exercises Other Exercises: supine BLE A/AAROM: hip abd (AA for technique), hip flexion, quad sets, hip extension x10 reps ea Other Exercises: sidelying hip abduction and extension BLE along with knee flex/ex x10 reps ea    General Comments General comments (skin integrity, edema, etc.): pt instructed  on positioning for BLE to prevent risk of contracture and for edema prevention, pt reports with distal limbs elevated it feels better, pt instructed not to keep pillow rolled up under his knees if it migrates around in the bed over time.      Pertinent Vitals/Pain Pain Assessment Pain Assessment: Faces Faces Pain Scale: Hurts little more Pain Location: BLE with ROM Pain Descriptors / Indicators: Guarding, Grimacing, Discomfort Pain Intervention(s): Limited activity within patient's tolerance, Monitored during session, Repositioned, Other (comment) (pt defers ice)    Home Living                          Prior Function            PT Goals (current goals can now be found in the care plan section) Acute Rehab PT Goals Patient Stated Goal: to go home and work toward bil prosthetic limbs/walking PT Goal Formulation: With patient Time For Goal Achievement: 08/29/23 Progress towards PT goals: Progressing toward goals    Frequency    Min 1X/week      PT Plan      Co-evaluation              AM-PAC PT "6 Clicks" Mobility   Outcome Measure  Help needed turning from your back to your side while in a flat bed without using bedrails?: A Little Help needed moving from lying on your back to sitting on the side of a flat bed without using bedrails?: A Lot Help needed moving to and from a bed to a chair (including a wheelchair)?: Total Help needed standing up from a chair using your arms (e.g., wheelchair or bedside chair)?: Total Help needed to walk in hospital room?: Total Help needed climbing 3-5 steps with a railing? : Total 6 Click Score: 9    End of Session   Activity Tolerance: Patient limited by fatigue;Patient tolerated treatment well (drowsy) Patient left: in bed;with call bell/phone within reach;with bed alarm set;Other (comment) (HOB >30* if he needs to drink water) Nurse Communication: Mobility status;Need for lift equipment PT Visit Diagnosis: Other  abnormalities of gait and mobility (R26.89)     Time: 1478-2956 PT Time Calculation (min) (ACUTE ONLY): 23 min  Charges:    $Therapeutic Exercise: 23-37 mins $Therapeutic Activity: 8-22 mins PT General Charges $$ ACUTE PT VISIT: 1 Visit                     Chanan Detwiler P., PTA Acute Rehabilitation Services Secure Chat Preferred 9a-5:30pm Office: [phone]    Dorathy Kinsman [facility] 08/19/2023, 6:18 PM

## 2023-08-19 NOTE — Progress Notes (Signed)
   CBC and BMP reviewed. Stable.  CBC    Component Value Date/Time   WBC 9.7 08/19/2023 1229   RBC 2.50 (L) 08/19/2023 1229   HGB 7.6 (L) 08/19/2023 1229   HGB 11.6 (L) 12/25/2022 1001   HGB 12.2 (L) 01/16/2021 1705   HCT 24.3 (L) 08/19/2023 1229   HCT 35.8 (L) 01/16/2021 1705   PLT 410 (H) 08/19/2023 1229   PLT 156 12/25/2022 1001   PLT 174 01/16/2021 1705   MCV 97.2 08/19/2023 1229   MCV 93 01/16/2021 1705   MCH 30.4 08/19/2023 1229   MCHC 31.3 08/19/2023 1229   RDW 16.0 (H) 08/19/2023 1229   RDW 12.1 01/16/2021 1705   LYMPHSABS 1.5 08/19/2023 1229   MONOABS 0.5 08/19/2023 1229   EOSABS 0.2 08/19/2023 1229   BASOSABS 0.1 08/19/2023 1229   CMP     Component Value Date/Time   NA 135 08/19/2023 1229   NA 148 (H) 11/25/2021 1521   K 4.7 08/19/2023 1229   CL 103 08/19/2023 1229   CO2 25 08/19/2023 1229   GLUCOSE 129 (H) 08/19/2023 1229   BUN 40 (H) 08/19/2023 1229   BUN 19 11/25/2021 1521   CREATININE 1.52 (H) 08/19/2023 1229   CREATININE 1.28 (H) 12/25/2022 1001   CALCIUM 8.0 (L) 08/19/2023 1229   PROT 6.3 (L) 08/19/2023 1229   PROT 7.8 01/16/2021 1705   ALBUMIN 1.9 (L) 08/19/2023 1229   ALBUMIN 3.9 01/16/2021 1705   AST 18 08/19/2023 1229   AST 21 12/25/2022 1001   ALT 10 08/19/2023 1229   ALT 25 12/25/2022 1001   ALKPHOS 43 08/19/2023 1229   BILITOT 0.8 08/19/2023 1229   BILITOT 0.9 12/25/2022 1001   EGFR 60 11/25/2021 1521   GFRNONAA 49 (L) 08/19/2023 1229   GFRNONAA >60 12/25/2022 1001     Carollee Herter, DO [facility]

## 2023-08-19 NOTE — Progress Notes (Signed)
PROGRESS NOTE    Earl Calderon  YQM:[id number] DOB: 01-28-1951 DOA: 08/07/2023 PCP: Melvenia Beam, MD  Subjective: Pt seen and examined.  Pt started on po seroquel last night due to agitation noted over previous evenings.  No reports of abnormal night time behavior  Pt without any complaints today.  Attempted to call pt's wife again today. [phone]. Still no answer. This makes the 3rd day I've attempted to call her without success.   Hospital Course: HPI: Earl Calderon is a 72 y.o. male with medical history significant of CKD, DM, MGUS and documented CHF in chart.  EF was 49% in May, 2024. LVH.    On review of chart, I do not notice any pulmonary pathology being documented.  Patient reports that his baseline functional status is 1 of getting "tired "after doing yard work in the house.  Otherwise he is able to get up and walk around the house without any trouble breathing.  However for the last 2 weeks patient describes a progressive sensation of shortness of breath.  Initially present with exertion and now patient states he is feeling short of breath even at rest.  This prompted the patient to come to the hospital today.  Patient further reports generalized anterior chest discomfort with efforts of breathing.  Patient does not report any fever or any pleuritic chest pain or any cough or leg swelling.   Patient's reason for coming to the hospital are as above per patient.   Further patient reports that he has had chronic leg "sores" for several weeks time, they have been worsening on both lower extremities for the last couple of weeks.  And they are more painful.   Further report is obtained from the ER provider.  Earl Calderon reports that the patient was found to be hypoxic on attempts at ambulation.  However he is not hypoxic at rest.  Oxygen has been given as needed.  Troponins have been mildly elevated, medical evaluation is sought. Review of Systems: Unable to  review all systems due to lack of cooperation from patient.  Patient wanted to not answer more for questions or engage in prolonged encounter.  Therefore review of system was not possible similarly exam was limited.  Antibiotic Therapy: Anti-infectives (From admission, onward)    Start     Dose/Rate Route Frequency Ordered Stop   08/14/23 1015  vancomycin (VANCOCIN) powder  Status:  Discontinued          As needed 08/14/23 1015 08/14/23 1035   08/14/23 0815  ceFAZolin (ANCEF) IVPB 2g/100 mL premix        2 g 200 mL/hr over 30 Minutes Intravenous On call to O.R. 08/14/23 0729 08/14/23 1012   08/11/23 0800  cefTRIAXone (ROCEPHIN) 1 g in sodium chloride 0.9 % 100 mL IVPB        1 g 200 mL/hr over 30 Minutes Intravenous Daily 08/11/23 0559 08/15/23 2359   08/10/23 2200  vancomycin (VANCOCIN) IVPB 1000 mg/200 mL premix  Status:  Discontinued        1,000 mg 200 mL/hr over 60 Minutes Intravenous Every 48 hours 08/09/23 1047 08/10/23 1206   08/10/23 1206  vancomycin variable dose per unstable renal function (pharmacist dosing)  Status:  Discontinued         Does not apply See admin instructions 08/10/23 1206 08/10/23 1218   08/10/23 0600  cefUROXime (ZINACEF) 1.5 g in sodium chloride 0.9 % 100 mL IVPB        1.5 g 200  mL/hr over 30 Minutes Intravenous On call to O.R. 08/09/23 0846 08/11/23 0559   08/09/23 1400  piperacillin-tazobactam (ZOSYN) IVPB 2.25 g  Status:  Discontinued        2.25 g 100 mL/hr over 30 Minutes Intravenous Every 8 hours 08/09/23 1050 08/11/23 0557   08/08/23 2200  vancomycin (VANCOREADY) IVPB 750 mg/150 mL  Status:  Discontinued        750 mg 150 mL/hr over 60 Minutes Intravenous Every 24 hours 08/08/23 0015 08/09/23 1047   08/07/23 2330  Vancomycin (VANCOCIN) 1,500 mg in sodium chloride 0.9 % 500 mL IVPB        1,500 mg 250 mL/hr over 120 Minutes Intravenous  Once 08/07/23 2240 08/08/23 0740   08/07/23 2300  piperacillin-tazobactam (ZOSYN) IVPB 3.375 g  Status:   Discontinued        3.375 g 12.5 mL/hr over 240 Minutes Intravenous Every 8 hours 08/07/23 2240 08/09/23 1050   08/07/23 1715  cefTRIAXone (ROCEPHIN) 1 g in sodium chloride 0.9 % 100 mL IVPB        1 g 200 mL/hr over 30 Minutes Intravenous  Once 08/07/23 1707 08/07/23 1848       Procedures: 08-10-2023 Aortogram with LE angiogram 08-14-2023 Bilateral BKA  Consultants: Ortho nephrology    Assessment and Plan: * Acute respiratory failure with hypoxia (HCC) On admission, Patient had presented initially for shortness of breath and hypoxia. -Unclear etiology, CTA chest showed no evidence of pulmonary embolus, aortic atherosclerosis, coronary artery calcifications.  No pleural effusion or pneumothorax. -2D echo still pending -O2 sats 100% on 2 L  08-17-2023 pt the time I saw the patient today, his 2D echo showed EF of 55 to 60%, no regional WMA, G2 DD Stable, O2 sats 99% on room air  08-18-2023 on RA. Resolved.  Delirium 08-18-2023 probably some hospital acquired delirium. Start scheduled seroquel 50 mg at 8 pm to head off any sun downing.  21-18-2024 received 50 mg seroquel at 8:12 pm last night. No reported overnight agitation. Continue with seroquel.  Hypoxia On admission through 12-15, 2024, This is actually the patient's principal reason for presentation.  Patient reports last couple of weeks of exertional shortness of breath that is progressive and now short of breath even at rest.  CT PE study is actually negative and I do not find marked fluid overload on the limited exam that I could do for the patient before patient interrupted the encounter.  Per report patient was actually hypoxic on ambulation but not at rest.  Given the current state of workup, residual concerns include possible pulmonary artery hypertension, pleural effusion or other limitation of cardiac output.  Especially given the troponin elevation I will go ahead and proceed with an echo at this time and see where that  leads [place].  Continue with as needed oxygen.  08-17-2023 resolved.  Acute renal failure superimposed on stage 3a chronic kidney disease (HCC) On admission through 12-15, 2024, Patient baseline creatinine between 1.06 and 1.31.  Current creatinine 1.78.  Without associated elevated BUN.  I suspect this may be related to infection and hyperglycemia.  Patient received 500 cc normal saline fluid bolus in the ER.  Check urinalysis sodium creatinine.  Trend creatinine response to 75 cc/h fluid hydration overnight  08-17-2023 Scr 1.49. at baseline.  08-19-2023 will repeat CMP today. As pt not taking in a lot of po fluids.  Gangrene of both feet (HCC) On admission through 12-15, 2024, , Of b/l lower extermity - foot  and ankle. Patient has foul-smelling slightly wet appearing black margin ulcers, see picture in media section of the chart.  Concerning for wet gangrene in this patient with uncontrolled diabetes mellitus and AKI. No  sepsis. Slight lactic acidosis POA, resolved. Patient has had ankle-brachial index evaluation done, see separate study in the CV procedure section.  Patient has abnormal right and left toe to brachial index.  At this time time MRI is pending to rule out deep osteomyelitis versus occult abscess in the feet.  Patient is s/p Rocephin.  I will draw blood cultures and start the patient on vancomycin and Zosyn.  Patient will need surgical input given the gangrene in his feet. I discussed case with Marisue Humble PA of vascular surgeon. Dr. Hetty Blend is in the OR (he was indirectly made aware of the consult).  08-17-2023 pt is s/p bilateral BKA(trans-tibial). Will need SNF. No need for abx. He has surgical cure of his gangrene.  08-19-2023 resolved with amputation  Essential hypertension 08-18-2023 restart scheduled hydralazine 25 mg tid.  08-19-2023 BP stable on hydralazine 25 mg tid.  Acute postoperative anemia due to expected blood loss 08-17-2023 had 1 unit PRBC transfused on  08-16-2023.  08-19-2023 order CBC today.  S/P bilateral BKA (below knee amputation) (HCC) - on 08/14/2023 08-17-2023 continues with wound vac. Will need SNF at discharge.  08-19-2023 needs SNF at discharge.  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  He has an IgG kappa spike. To me, I still think that his diabetes can be a much bigger problem than myeloma will be. Hopefully, he will be able to get the blood sugar under control. We will have to follow him up.  We will have to get him back in about 2 or 3 months for follow-up. Hopefully, his blood sugars would not cause problems for him that we will end him up in the hospital."  08-17-2023 stable.  Type 2 diabetes mellitus with chronic kidney disease, with long-term current use of insulin (HCC) On admission, Associated with hyperglycemia, without evidence of DKA.  Glucose on presentation was 525.  Likely worsened by infection and possible nonadherence to regimen.  Patient received 10 units of subcutaneous insulin in the ER.  I will continue with patient's remotely documented Levemir 8 units twice daily as well as insulin sliding scale ACHS moderate.  We will trend this.  08-17-2023 on bid levemir and SSI. CBG in mid 200s. Will increase levemir to 10 units BID.   08-19-2023 CBG in acceptable ranges. Continue with levemir 10 units BID. Hold if CBG < 110  Chronic combined systolic and diastolic heart failure (HCC) Chronically documented. P[place]net does not appear to be fluids overloaded at this time. Med rec pending pharmacy input. Trop of 127 at presentation now down to 39 - over all presentation not consistnt with ACS. EKG showing stable LVH changes since 12/29/2022. See complaint of SOB/hypoxia as well.       DVT prophylaxis: enoxaparin (LOVENOX) injection 40 mg Start: 08/19/23 1000 SCD's Start: 08/14/23 1124 SCDs Start: 08/07/23 2043     Code  Status: Full Code Family Communication: I have attempted for 3rd day in the row to reach pt's wife. [phone]. As listed in EPIC. No answer.   Disposition Plan: SNF Reason for continuing need for hospitalization: medically stable for DC to SNF.  Objective: Vitals:   08/18/23 1815 08/18/23 2015 08/19/23 0555 08/19/23 0746  BP: (!) 169/78 (!) 167/85 Marland Kitchen)  158/65 (!) 132/58  Pulse: 77 79 (!) 59 60  Resp: 18   18  Temp: 98.5 F (36.9 C) 99.2 F (37.3 C) 98.6 F (37 C) 98.2 F (36.8 C)  TempSrc: Oral Oral Oral Oral  SpO2: 95% 98% 99% 98%  Weight:      Height:        Intake/Output Summary (Last 24 hours) at 08/19/2023 1142 Last data filed at 08/19/2023 1610 Gross per 24 hour  Intake 243 ml  Output 800 ml  Net -557 ml   Filed Weights   08/07/23 2223 08/14/23 0813  Weight: 64.9 kg 68 kg    Examination:  Physical Exam Vitals and nursing note reviewed.  Constitutional:      General: He is not in acute distress.    Appearance: He is not toxic-appearing or diaphoretic.  HENT:     Head: Normocephalic and atraumatic.     Nose: Nose normal.  Cardiovascular:     Rate and Rhythm: Normal rate and regular rhythm.  Pulmonary:     Effort: Pulmonary effort is normal.     Breath sounds: Normal breath sounds.  Abdominal:     General: Bowel sounds are normal.     Palpations: Abdomen is soft.  Musculoskeletal:     Comments: Bilateral BKA. Wound vac intacted  Skin:    Capillary Refill: Capillary refill takes less than 2 seconds.     Data Reviewed: I have personally reviewed following labs and imaging studies  CBC: Recent Labs  Lab 08/13/23 0250 08/14/23 0344 08/16/23 0352 08/16/23 1040 08/17/23 0321  WBC 9.8 8.4 9.6  --  8.2  HGB 8.1* 8.1* 6.5* 7.4* 7.4*  HCT 25.1* 24.7* 19.8* 22.1* 22.3*  MCV 93.7 92.9 94.3  --  93.3  PLT 357 371 372  --  351   Basic Metabolic Panel: Recent Labs  Lab 08/13/23 0250 08/14/23 0344 08/16/23 0352 08/17/23 0321  NA 140 139 133*  136  K 3.6 3.6 4.3 4.5  CL 109 108 104 105  CO2 24 24 24 24   GLUCOSE 135* 218* 229* 145*  BUN 48* 40* 41* 32*  CREATININE 5.28* 3.40* 2.17* 1.49*  CALCIUM 7.7* 7.7* 7.5* 7.5*   GFR: Estimated Creatinine Clearance: 44.4 mL/min (A) (by C-G formula based on SCr of 1.49 mg/dL (H)).  BNP (last 3 results) Recent Labs    08/07/23 1128  BNP 200.5*   CBG: Recent Labs  Lab 08/18/23 1218 08/18/23 1640 08/18/23 2115 08/19/23 0745 08/19/23 1107  GLUCAP 100* 157* 216* 81 110*    Recent Results (from the past 240 hours)  Surgical pcr screen     Status: None   Collection Time: 08/13/23  6:18 PM   Specimen: Nasal Mucosa; Nasal Swab  Result Value Ref Range Status   MRSA, PCR NEGATIVE NEGATIVE Final   Staphylococcus aureus NEGATIVE NEGATIVE Final    Comment: (NOTE) The Xpert SA Assay (FDA approved for NASAL specimens in patients 2 years of age and older), is one component of a comprehensive surveillance program. It is not intended to diagnose infection nor to guide or monitor treatment. Performed at [facility], [address] [place], [place] [address]      Radiology Studies: No results found.  Scheduled Meds:  vitamin C  1,000 mg Oral Daily   aspirin EC  81 mg Oral Daily   enoxaparin (LOVENOX) injection  40 mg Subcutaneous Q24H   hydrALAZINE  25 mg Oral Q8H   insulin aspart  0-6  Units Subcutaneous TID WC   insulin detemir  10 Units Subcutaneous BID   lidocaine  1 patch Transdermal Q24H   LORazepam  1 mg Intravenous Once   nutrition supplement (JUVEN)  1 packet Oral BID BM   QUEtiapine  50 mg Oral QPM   sodium chloride flush  3 mL Intravenous Q12H   zinc sulfate (50mg  elemental zinc)  220 mg Oral Daily   Continuous Infusions:  magnesium sulfate bolus IVPB       LOS: 12 days   Time spent: 40 minutes  Carollee Herter, DO  [facility]  08/19/2023, 11:42 AM

## 2023-08-19 NOTE — Progress Notes (Signed)
Orthopedic Tech Progress Note Patient Details:  Earl Calderon 1951/08/01 [id number]  Spoke with RN/LPN about patient needing a STUMP SHRINKER, I let her know there was no order just yet, so tomorrow morning [employer] will be applying once order comes through.   Patient ID: Earl Calderon, male   DOB: [birth year], 72 y.o.   MRN: [id number]  Donald Pore 08/19/2023, 10:39 PM

## 2023-08-19 NOTE — Progress Notes (Signed)
Physical Therapy Treatment Patient Details Name: Earl Calderon MRN: [id number] DOB: Mar 13, 1951 Today's Date: 08/19/2023   History of Present Illness Patient is a 72 yo male presenting to the ED with SOB and fatigue on 08/07/23. Admitted with acute respiratory failure with hypoxia. Found to have bilateral lower extremity wounds, gangrene, chronic limb ischemia. Arteriogram performed on 12/9. 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  PMH includes: CAD, CKD stage III, DM type II, uncontrolled, MGUS, chronic systolic CHF, NSVT, resistant hypertension, hyperlipidemia    PT Comments  Pt received in supine, agreeable to therapy session and appearing oriented, receptive to instruction on use of ice for pain mgmt and HEP handout. Session time limited due to arrival of pt meal tray and pt requesting to eat prior to transfer training, to preserve his appetite, but agreeable to positioning more in upright chair posture in bed. Per RN, plan to remove bil wound vacs later today which are both currently attached but turned off. Pt continues to benefit from PT services to progress toward functional mobility goals.     If plan is discharge home, recommend the following: Two people to help with walking and/or transfers;Assistance with cooking/housework;Assist for transportation;Help with stairs or ramp for entrance;Supervision due to cognitive status   Can travel by private vehicle     No  Equipment Recommendations  Wheelchair (measurements PT);Wheelchair cushion (measurements PT);Hoyer lift;Other (comment) (ramp, drop arm BSC, slide board)    Recommendations for Other Services       Precautions / Restrictions Precautions Precautions: Fall;Other (comment) Precaution Comments: wound vacs BLE Restrictions Weight Bearing Restrictions Per Provider Order: Yes RLE Weight Bearing Per Provider Order: Non weight bearing LLE Weight Bearing Per Provider Order: Non weight bearing     Mobility  Bed Mobility Overal  bed mobility: Needs Assistance Bed Mobility: Supine to Sit     Supine to sit: Min assist, HOB elevated, Used rails     General bed mobility comments: minA for supine to nearly upright with use of HOB elevation and side rails for adjustment of pillow/posture prior to food arrival, pt refused EOB/OOB due to wanting to eat before increased pain/OOB mobility, pt agreeable to attempt later in the day.    Transfers                   General transfer comment: deferred due to arrival of his meal tray    Ambulation/Gait                   Stairs             Wheelchair Mobility     Tilt Bed    Modified Rankin (Stroke Patients Only)       Balance Overall balance assessment: Needs assistance Sitting-balance support: Bilateral upper extremity supported, Feet unsupported Sitting balance-Leahy Scale: Poor Sitting balance - Comments: limited long sitting to reposition upright in bed, pt using mattress/side rail to pull on                                    Cognition Arousal: Alert Behavior During Therapy: Earl Calderon for tasks assessed/performed Overall Cognitive Status: No family/caregiver present to determine baseline cognitive functioning                                 General Comments: Pt appears alert and oriented to situation/location/self (PTA  did not assess time), session limited due to arrival of pt lunch and pt in advance had requested to pause session if his food arrived due to feeling hungry.        Exercises Other Exercises Other Exercises: BKA amputee exercise handout brought to his room, plan to review in further detail next session, pt receptive Other Exercises: reviewed use of ice and tapping/rubbing on limbs for phantom pain/sensation mgmt (esp once wound vacs removed), pt defers ice packs at this time.    General Comments General comments (skin integrity, edema, etc.): no acute s/sx distress, pt with bil wound vac off  and per RN plans to remove vac soon so kept devices turned off. pt w/o water in room and pt agreeable to PTA obtaining ice water for him, discussed benefits of maintaining adequate hydration to wound healing and drinking water rather than sugary beverages, pt very receptive to info.      Pertinent Vitals/Pain Pain Assessment Pain Assessment: Faces Faces Pain Scale: Hurts a little bit Pain Location: BLE at rest Pain Descriptors / Indicators: Guarding Pain Intervention(s): Limited activity within patient's tolerance, Monitored during session, Repositioned (pt defers ice)    Home Living                          Prior Function            PT Goals (current goals can now be found in the care plan section) Acute Rehab PT Goals PT Goal Formulation: With patient Time For Goal Achievement: 08/29/23 Progress towards PT goals: Progressing toward goals    Frequency    Min 1X/week      PT Plan      Co-evaluation              AM-PAC PT "6 Clicks" Mobility   Outcome Measure  Help needed turning from your back to your side while in a flat bed without using bedrails?: A Little Help needed moving from lying on your back to sitting on the side of a flat bed without using bedrails?: A Little Help needed moving to and from a bed to a chair (including a wheelchair)?: Total Help needed standing up from a chair using your arms (e.g., wheelchair or bedside chair)?: Total Help needed to walk in Calderon room?: Total Help needed climbing 3-5 steps with a railing? : Total 6 Click Score: 10    End of Session   Activity Tolerance: Other (comment) (limited due to arrival of food and pt wanting to eat first) Patient left: in bed;with call bell/phone within reach;with bed alarm set;Other (comment) (bed in chair posture, pt assisted to set up meal prior to eating) Nurse Communication: Mobility status;Need for lift equipment PT Visit Diagnosis: Other abnormalities of gait and mobility  (R26.89)     Time: [id number] PT Time Calculation (min) (ACUTE ONLY): 9 min  Charges:    $Therapeutic Activity: 8-22 mins PT General Charges $$ ACUTE PT VISIT: 1 Visit                     Earl Ivey P., PTA Acute Rehabilitation Services Secure Chat Preferred 9a-5:30pm Office: [phone]    Earl Calderon 08/19/2023, 4:53 PM

## 2023-08-20 DIAGNOSIS — I10 Essential (primary) hypertension: Secondary | ICD-10-CM | POA: Diagnosis not present

## 2023-08-20 DIAGNOSIS — J9601 Acute respiratory failure with hypoxia: Secondary | ICD-10-CM | POA: Diagnosis not present

## 2023-08-20 DIAGNOSIS — Z89512 Acquired absence of left leg below knee: Secondary | ICD-10-CM | POA: Diagnosis not present

## 2023-08-20 DIAGNOSIS — N179 Acute kidney failure, unspecified: Secondary | ICD-10-CM | POA: Diagnosis not present

## 2023-08-20 LAB — GLUCOSE, CAPILLARY
Glucose-Capillary: 124 mg/dL — ABNORMAL HIGH (ref 70–99)
Glucose-Capillary: 115 mg/dL — ABNORMAL HIGH (ref 70–99)
Glucose-Capillary: 162 mg/dL — ABNORMAL HIGH (ref 70–99)
Glucose-Capillary: 110 mg/dL — ABNORMAL HIGH (ref 70–99)
Glucose-Capillary: 157 mg/dL — ABNORMAL HIGH (ref 70–99)

## 2023-08-20 MED ORDER — QUETIAPINE FUMARATE 25 MG PO TABS
25.0000 mg | ORAL_TABLET | Freq: Every day | ORAL | Status: DC
  Administered 2023-08-21 – 2023-08-28 (×8): 25 mg via ORAL
  Filled 2023-08-20 (×8): qty 1

## 2023-08-20 MED ORDER — ZIPRASIDONE MESYLATE 20 MG IM SOLR: 20.0000 mg | INTRAMUSCULAR | Status: DC | PRN

## 2023-08-20 NOTE — Progress Notes (Signed)
PROGRESS NOTE    Earl Calderon  ZOX:[id number] DOB: 21-Oct-1950 DOA: 08/07/2023 PCP: Melvenia Beam, MD  Subjective: Pt seen and examined. Pt agitated this afternoon. Demanding to talk to a hospital representative about his "rights". Pt does not recognized his room. Discussed that he was moved from [facility] due to need for a surgical bed for another patient.  I was finally able to reach pt's wife sonya after 4 days. Wife is adamant that pt not go to SNF. She states pt is going back home and she will care for him. She wants him to go to "[facility]" as an outpatient.  Wife flatly REFUSES for patient to go to SNF.  Wife refuses for pt to have home PT/OT. She does want some DME equipment. DME ordered.  CM made aware that wife wants pt to go home. Wife wants pt to go home on Saturday.   Hospital Course: HPI: Earl Calderon is a 72 y.o. male with medical history significant of CKD, DM, MGUS and documented CHF in chart.  EF was 49% in May, 2024. LVH.    On review of chart, I do not notice any pulmonary pathology being documented.  Patient reports that his baseline functional status is 1 of getting "tired "after doing yard work in the house.  Otherwise he is able to get up and walk around the house without any trouble breathing.  However for the last 2 weeks patient describes a progressive sensation of shortness of breath.  Initially present with exertion and now patient states he is feeling short of breath even at rest.  This prompted the patient to come to the hospital today.  Patient further reports generalized anterior chest discomfort with efforts of breathing.  Patient does not report any fever or any pleuritic chest pain or any cough or leg swelling.   Patient's reason for coming to the hospital are as above per patient.   Further patient reports that he has had chronic leg "sores" for several weeks time, they have been worsening on  both lower extremities for the last couple of weeks.  And they are more painful.   Further report is obtained from the ER provider.  Asher Muir Barrett reports that the patient was found to be hypoxic on attempts at ambulation.  However he is not hypoxic at rest.  Oxygen has been given as needed.  Troponins have been mildly elevated, medical evaluation is sought. Review of Systems: Unable to review all systems due to lack of cooperation from patient.  Patient wanted to not answer more for questions or engage in prolonged encounter.  Therefore review of system was not possible similarly exam was limited.  Antibiotic Therapy: Anti-infectives (From admission, onward)    Start     Dose/Rate Route Frequency Ordered Stop   08/14/23 1015  vancomycin (VANCOCIN) powder  Status:  Discontinued          As needed 08/14/23 1015 08/14/23 1035   08/14/23 0815  ceFAZolin (ANCEF) IVPB 2g/100 mL premix        2 g 200 mL/hr over 30 Minutes Intravenous On call to O.R. 08/14/23 0729 08/14/23 1012   08/11/23 0800  cefTRIAXone (ROCEPHIN) 1 g in sodium chloride 0.9 % 100 mL IVPB        1 g 200 mL/hr over 30 Minutes Intravenous Daily 08/11/23 0559 08/15/23 2359   08/10/23 2200  vancomycin (VANCOCIN) IVPB 1000 mg/200 mL premix  Status:  Discontinued  1,000 mg 200 mL/hr over 60 Minutes Intravenous Every 48 hours 08/09/23 1047 08/10/23 1206   08/10/23 1206  vancomycin variable dose per unstable renal function (pharmacist dosing)  Status:  Discontinued         Does not apply See admin instructions 08/10/23 1206 08/10/23 1218   08/10/23 0600  cefUROXime (ZINACEF) 1.5 g in sodium chloride 0.9 % 100 mL IVPB        1.5 g 200 mL/hr over 30 Minutes Intravenous On call to O.R. 08/09/23 0846 08/11/23 0559   08/09/23 1400  piperacillin-tazobactam (ZOSYN) IVPB 2.25 g  Status:  Discontinued        2.25 g 100 mL/hr over 30 Minutes Intravenous Every 8 hours 08/09/23 1050 08/11/23 0557   08/08/23 2200  vancomycin (VANCOREADY)  IVPB 750 mg/150 mL  Status:  Discontinued        750 mg 150 mL/hr over 60 Minutes Intravenous Every 24 hours 08/08/23 0015 08/09/23 1047   08/07/23 2330  Vancomycin (VANCOCIN) 1,500 mg in sodium chloride 0.9 % 500 mL IVPB        1,500 mg 250 mL/hr over 120 Minutes Intravenous  Once 08/07/23 2240 08/08/23 0740   08/07/23 2300  piperacillin-tazobactam (ZOSYN) IVPB 3.375 g  Status:  Discontinued        3.375 g 12.5 mL/hr over 240 Minutes Intravenous Every 8 hours 08/07/23 2240 08/09/23 1050   08/07/23 1715  cefTRIAXone (ROCEPHIN) 1 g in sodium chloride 0.9 % 100 mL IVPB        1 g 200 mL/hr over 30 Minutes Intravenous  Once 08/07/23 1707 08/07/23 1848       Procedures: 08-10-2023 Aortogram with LE angiogram 08-14-2023 Bilateral BKA  Consultants: Ortho nephrology    Assessment and Plan: * Acute respiratory failure with hypoxia (HCC) On admission, Patient had presented initially for shortness of breath and hypoxia. -Unclear etiology, CTA chest showed no evidence of pulmonary embolus, aortic atherosclerosis, coronary artery calcifications.  No pleural effusion or pneumothorax. -2D echo still pending -O2 sats 100% on 2 L  08-17-2023 pt the time I saw the patient today, his 2D echo showed EF of 55 to 60%, no regional WMA, G2 DD Stable, O2 sats 99% on room air  08-18-2023 on RA. Resolved.  S/P bilateral BKA (below knee amputation) (HCC) - on 08/14/2023 08-17-2023 continues with wound vac. Will need SNF at discharge.  08-19-2023 needs SNF at discharge.  08-20-2023 spoke to pt's wife Lamar Laundry today after 4 days of attempting to contact her.  Have never seen the wife at the hospital. Wife is adamant that pt is not going to SNF at discharge. Wife states that pt is going home at discharge and she will provide care. She does not want home PT/OT. Only wants referral to outpatient PT.  Delirium 08-18-2023 probably some hospital acquired delirium. Start scheduled seroquel 50 mg at 8 pm to head off  any sun downing.  08-19-2023 received 50 mg seroquel at 8:12 pm last night. No reported overnight agitation. Continue with seroquel.  08-20-2023 continue with 50 mg seroquel at 8 pm. Will add prn IM geodon. Add am dose of seroquel 25 mg.  Hypoxia On admission through 12-15, 2024, This is actually the patient's principal reason for presentation.  Patient reports last couple of weeks of exertional shortness of breath that is progressive and now short of breath even at rest.  CT PE study is actually negative and I do not find marked fluid overload on the limited exam that  I could do for the patient before patient interrupted the encounter.  Per report patient was actually hypoxic on ambulation but not at rest.  Given the current state of workup, residual concerns include possible pulmonary artery hypertension, pleural effusion or other limitation of cardiac output.  Especially given the troponin elevation I will go ahead and proceed with an echo at this time and see where that leads [place].  Continue with as needed oxygen.  08-17-2023 resolved.  Acute renal failure superimposed on stage 3a chronic kidney disease (HCC) On admission through 12-15, 2024, Patient baseline creatinine between 1.06 and 1.31.  Current creatinine 1.78.  Without associated elevated BUN.  I suspect this may be related to infection and hyperglycemia.  Patient received 500 cc normal saline fluid bolus in the ER.  Check urinalysis sodium creatinine.  Trend creatinine response to 75 cc/h fluid hydration overnight  08-17-2023 Scr 1.49. at baseline.  08-19-2023 will repeat CMP today. As pt not taking in a lot of po fluids. 08-20-2023 Scr stable at 1.52  Gangrene of both feet (HCC) On admission through 12-15, 2024, , Of b/l lower extermity - foot and ankle. Patient has foul-smelling slightly wet appearing black margin ulcers, see picture in media section of the chart.  Concerning for wet gangrene in this patient with uncontrolled  diabetes mellitus and AKI. No  sepsis. Slight lactic acidosis POA, resolved. Patient has had ankle-brachial index evaluation done, see separate study in the CV procedure section.  Patient has abnormal right and left toe to brachial index.  At this time time MRI is pending to rule out deep osteomyelitis versus occult abscess in the feet.  Patient is s/p Rocephin.  I will draw blood cultures and start the patient on vancomycin and Zosyn.  Patient will need surgical input given the gangrene in his feet. I discussed case with Marisue Humble PA of vascular surgeon. Dr. Hetty Blend is in the OR (he was indirectly made aware of the consult).  08-17-2023 pt is s/p bilateral BKA(trans-tibial). Will need SNF. No need for abx. He has surgical cure of his gangrene.  08-19-2023 resolved with amputation  Essential hypertension 08-18-2023 restart scheduled hydralazine 25 mg tid.  08-19-2023 BP stable on hydralazine 25 mg tid.  Acute postoperative anemia due to expected blood loss 08-17-2023 had 1 unit PRBC transfused on 08-16-2023.  08-19-2023 order CBC today.  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  He has an IgG kappa spike. To me, I still think that his diabetes can be a much bigger problem than myeloma will be. Hopefully, he will be able to get the blood sugar under control. We will have to follow him up.  We will have to get him back in about 2 or 3 months for follow-up. Hopefully, his blood sugars would not cause problems for him that we will end him up in the hospital."  08-17-2023 stable.  Type 2 diabetes mellitus with chronic kidney disease, with long-term current use of insulin (HCC) On admission, Associated with hyperglycemia, without evidence of DKA.  Glucose on presentation was 525.  Likely worsened by infection and possible nonadherence to regimen.  Patient received 10 units of subcutaneous insulin in the ER.  I  will continue with patient's remotely documented Levemir 8 units twice daily as well as insulin sliding scale ACHS moderate.  We will trend this.  08-17-2023 on bid levemir and SSI. CBG in mid 200s. Will increase levemir to 10  units BID.   08-19-2023 CBG in acceptable ranges. Continue with levemir 10 units BID. Hold if CBG < 110  08-20-2023 CBG acceptable  Chronic combined systolic and diastolic heart failure (HCC) Chronically documented. [place] does not appear to be fluids overloaded at this time. Med rec pending pharmacy input. Trop of 127 at presentation now down to 39 - over all presentation not consistnt with ACS. EKG showing stable LVH changes since 12/29/2022. See complaint of SOB/hypoxia as well.   DVT prophylaxis: enoxaparin (LOVENOX) injection 40 mg Start: 08/19/23 1000 SCD's Start: 08/14/23 1124 SCDs Start: 08/07/23 2043     Code Status: Full Code Family Communication: spoke with wife Sonya. First time I have been able to reach pt's wife in 4 days. Discussed that she refuses to have pt placed into SNF despite PT/OT recs. Wife states she is going to care for patient at home. Wife refuses home PT/OT. She wants him to go to outpatient PT near her home. Have arranged to home DME. Plan for DC on Saturday. Wife refused pt to have hospital bed. Disposition Plan: return home Reason for continuing need for hospitalization: medically stable for DC.  Objective: Vitals:   08/19/23 1936 08/20/23 0504 08/20/23 0854 08/20/23 1425  BP: (!) 150/77 (!) 178/72 (!) 175/72 (!) 154/80  Pulse: 63 60 (!) 55 64  Resp: 18 17 18 18   Temp: 97.8 F (36.6 C) 98.3 F (36.8 C) 97.6 F (36.4 C)   TempSrc: Oral  Oral   SpO2: 99% 98% 97% 100%  Weight:      Height:        Intake/Output Summary (Last 24 hours) at 08/20/2023 1623 Last data filed at 08/20/2023 1000 Gross per 24 hour  Intake 243 ml  Output 200 ml  Net 43 ml   Filed Weights   08/07/23 2223 08/14/23 0813  Weight: 64.9 kg 68 kg     Examination:  Physical Exam Vitals and nursing note reviewed.  Constitutional:      General: He is not in acute distress.    Appearance: He is not toxic-appearing.  HENT:     Head: Normocephalic and atraumatic.     Nose: Nose normal.  Cardiovascular:     Rate and Rhythm: Normal rate and regular rhythm.  Pulmonary:     Effort: Pulmonary effort is normal.     Breath sounds: Normal breath sounds.  Abdominal:     General: Bowel sounds are normal.     Palpations: Abdomen is soft.  Musculoskeletal:     Comments: Bilateral BKA  Skin:    Capillary Refill: Capillary refill takes less than 2 seconds.  Neurological:     Mental Status: He is disoriented.     Data Reviewed: I have personally reviewed following labs and imaging studies  CBC: Recent Labs  Lab 08/14/23 0344 08/16/23 0352 08/16/23 1040 08/17/23 0321 08/19/23 1229  WBC 8.4 9.6  --  8.2 9.7  NEUTROABS  --   --   --   --  7.3  HGB 8.1* 6.5* 7.4* 7.4* 7.6*  HCT 24.7* 19.8* 22.1* 22.3* 24.3*  MCV 92.9 94.3  --  93.3 97.2  PLT 371 372  --  351 410*   Basic Metabolic Panel: Recent Labs  Lab 08/14/23 0344 08/16/23 0352 08/17/23 0321 08/19/23 1229  NA 139 133* 136 135  K 3.6 4.3 4.5 4.7  CL 108 104 105 103  CO2 24 24 24 25   GLUCOSE 218* 229* 145* 129*  BUN 40* 41* 32*  40*  CREATININE 3.40* 2.17* 1.49* 1.52*  CALCIUM 7.7* 7.5* 7.5* 8.0*   GFR: Estimated Creatinine Clearance: 43.5 mL/min (A) (by C-G formula based on SCr of 1.52 mg/dL (H)). Liver Function Tests: Recent Labs  Lab 08/19/23 1229  AST 18  ALT 10  ALKPHOS 43  BILITOT 0.8  PROT 6.3*  ALBUMIN 1.9*   BNP (last 3 results) Recent Labs    08/07/23 1128  BNP 200.5*   CBG: Recent Labs  Lab 08/19/23 1618 08/19/23 2132 08/20/23 0603 08/20/23 0806 08/20/23 1114  GLUCAP 183* 272* 124* 115* 162*   Anemia Panel: Recent Labs    08/19/23 1822  TIBC 239*  IRON 49    Recent Results (from the past 240 hours)  Surgical pcr screen      Status: None   Collection Time: 08/13/23  6:18 PM   Specimen: Nasal Mucosa; Nasal Swab  Result Value Ref Range Status   MRSA, PCR NEGATIVE NEGATIVE Final   Staphylococcus aureus NEGATIVE NEGATIVE Final    Comment: (NOTE) The Xpert SA Assay (FDA approved for NASAL specimens in patients 27 years of age and older), is one component of a comprehensive surveillance program. It is not intended to diagnose infection nor to guide or monitor treatment. Performed at [facility], [address], [place], [place] [address]      Radiology Studies: No results found.  Scheduled Meds:  vitamin C  1,000 mg Oral Daily   aspirin EC  81 mg Oral Daily   enoxaparin (LOVENOX) injection  40 mg Subcutaneous Q24H   hydrALAZINE  25 mg Oral Q8H   insulin aspart  0-6 Units Subcutaneous TID WC   insulin detemir  10 Units Subcutaneous BID   lidocaine  1 patch Transdermal Q24H   LORazepam  1 mg Intravenous Once   nutrition supplement (JUVEN)  1 packet Oral BID BM   [START ON 08/21/2023] QUEtiapine  25 mg Oral Q breakfast   QUEtiapine  50 mg Oral QPM   sodium chloride flush  3 mL Intravenous Q12H   zinc sulfate (50mg  elemental zinc)  220 mg Oral Daily   Continuous Infusions:  magnesium sulfate bolus IVPB       LOS: 13 days   Time spent: 40 minutes  Carollee Herter, DO  [facility]  08/20/2023, 4:23 PM

## 2023-08-20 NOTE — TOC Progression Note (Signed)
Transition of Care [facility]) - Progression Note    Patient Details  Name: Earl Calderon MRN: [id number] Date of Birth: Mar 16, 1951  Transition of [facility]) CM/SW Contact  Dellie Burns [place], [place] Phone Number: 08/20/2023, 11:51 AM  Clinical Narrative:   discussed SNF choice with pt and pt's wife. Pt and wife adamantly refusing SNF. Discussed pt was initially agreeable to SNF [place] [address]" but wife explains he is referring to outpatient PT in [place]. Pt's wife aware pt is currently requiring 2 person assist and states pt has 24/7 physical assist at home but did not elaborate on who would be providing care.   Discussed HH/DME, wife is agreeable to this option. Pt has a BSC at home, no other DME in place per wife. MD, RNCM, and therapy updated.   Dellie Burns, MSW, LCSW [phone] (coverage)      Expected Discharge Plan: Skilled Nursing Facility (to be determined post op) Barriers to Discharge: Continued Medical Work up  Expected Discharge Plan and Services In-house Referral: Clinical Social Work                                             Social Determinants of Health (SDOH) Interventions SDOH Screenings   Food Insecurity: No Food Insecurity (08/07/2023)  Housing: Low Risk  (08/07/2023)  Transportation Needs: No Transportation Needs (08/07/2023)  Utilities: Not At Risk (08/07/2023)  Depression (PHQ2-9): Low Risk  (10/18/2020)  Financial Resource Strain: Medium Risk (09/21/2020)  Tobacco Use: Low Risk  (08/14/2023)    Readmission Risk Interventions     No data to display

## 2023-08-20 NOTE — Progress Notes (Signed)
OT Cancellation Note  Patient Details Name: Earl Calderon MRN: [id number] DOB: [birth year]   Cancelled Treatment:    Reason Eval/Treat Not Completed: Other (comment).  Attempted skilled OT treatment session.  Pt. Declines stating he has just eaten breakfast and wants to relax for a while.  Will check back as able.    Alessandra Bevels Lorraine-COTA/L 08/20/2023, 10:57 AM

## 2023-08-20 NOTE — Plan of Care (Signed)

## 2023-08-20 NOTE — TOC Progression Note (Signed)
Transition of Care [facility]) - Progression Note    Patient Details  Name: Earl Calderon MRN: [id number] Date of Birth: October 29, 1950  Transition of Care [facility]) CM/SW Contact  Epifanio Lesches, RN Phone Number: 08/20/2023, 3:33 PM  Clinical Narrative:    Pt/wife declined SNF placement. Adamant about transitioning to home. Agreeable to home health services. No provider preference. Referral made with Amy/ [facility] and accepted.  TOC team will continue to monitor and assist with needs...  Expected Discharge Plan: Home w Home Health Services Barriers to Discharge: Continued Medical Work up  Expected Discharge Plan and Services In-house Referral: Clinical Social Work Discharge Planning Services: CM Consult                               HH Arranged: PT, OT HH Agency: [facility] Date [facility] Agency Contacted: 08/20/23 Time HH Agency Contacted: 1530 Representative spoke with at [facility] Agency: Amy   Social Determinants of Health (SDOH) Interventions SDOH Screenings   Food Insecurity: No Food Insecurity (08/07/2023)  Housing: Low Risk  (08/07/2023)  Transportation Needs: No Transportation Needs (08/07/2023)  Utilities: Not At Risk (08/07/2023)  Depression (PHQ2-9): Low Risk  (10/18/2020)  Financial Resource Strain: Medium Risk (09/21/2020)  Tobacco Use: Low Risk  (08/14/2023)    Readmission Risk Interventions     No data to display

## 2023-08-21 ENCOUNTER — Other Ambulatory Visit (HOSPITAL_COMMUNITY): Payer: Self-pay

## 2023-08-21 DIAGNOSIS — I5042 Chronic combined systolic (congestive) and diastolic (congestive) heart failure: Secondary | ICD-10-CM | POA: Diagnosis not present

## 2023-08-21 DIAGNOSIS — J9601 Acute respiratory failure with hypoxia: Secondary | ICD-10-CM | POA: Diagnosis not present

## 2023-08-21 DIAGNOSIS — I10 Essential (primary) hypertension: Secondary | ICD-10-CM | POA: Diagnosis not present

## 2023-08-21 DIAGNOSIS — N179 Acute kidney failure, unspecified: Secondary | ICD-10-CM | POA: Diagnosis not present

## 2023-08-21 LAB — GLUCOSE, CAPILLARY
Glucose-Capillary: 129 mg/dL — ABNORMAL HIGH (ref 70–99)
Glucose-Capillary: 121 mg/dL — ABNORMAL HIGH (ref 70–99)
Glucose-Capillary: 230 mg/dL — ABNORMAL HIGH (ref 70–99)
Glucose-Capillary: 152 mg/dL — ABNORMAL HIGH (ref 70–99)

## 2023-08-21 LAB — CREATININE, SERUM
Creatinine, Ser: 1.38 mg/dL — ABNORMAL HIGH (ref 0.61–1.24)
GFR, Estimated: 55 mL/min — ABNORMAL LOW (ref >60)

## 2023-08-21 MED ORDER — ASCORBIC ACID 1000 MG PO TABS
1000.0000 mg | ORAL_TABLET | Freq: Every day | ORAL | 0 refills | Status: AC
  Filled 2023-08-21: qty 90, 90d supply, fill #0

## 2023-08-21 MED ORDER — LABETALOL HCL 5 MG/ML IV SOLN
10.0000 mg | INTRAVENOUS | Status: DC | PRN
  Administered 2023-08-25 – 2023-09-03 (×7): 10 mg via INTRAVENOUS
  Filled 2023-08-21 (×10): qty 4

## 2023-08-21 MED ORDER — HYDROMORPHONE HCL 2 MG PO TABS
1.0000 mg | ORAL_TABLET | ORAL | Status: DC | PRN
  Filled 2023-08-21: qty 1

## 2023-08-21 MED ORDER — POLYSACCHARIDE IRON COMPLEX 150 MG PO CAPS
150.0000 mg | ORAL_CAPSULE | Freq: Every day | ORAL | Status: DC
  Administered 2023-08-21 – 2023-09-03 (×14): 150 mg via ORAL
  Filled 2023-08-21 (×15): qty 1

## 2023-08-21 MED ORDER — QUETIAPINE FUMARATE 50 MG PO TABS
50.0000 mg | ORAL_TABLET | Freq: Every evening | ORAL | 0 refills | Status: DC
  Filled 2023-08-21: qty 30, 30d supply, fill #0

## 2023-08-21 MED ORDER — ISOSORBIDE DINITRATE 20 MG PO TABS
20.0000 mg | ORAL_TABLET | Freq: Two times a day (BID) | ORAL | 0 refills | Status: DC
  Filled 2023-08-21: qty 60, 30d supply, fill #0

## 2023-08-21 MED ORDER — ISOSORBIDE DINITRATE 20 MG PO TABS
20.0000 mg | ORAL_TABLET | Freq: Two times a day (BID) | ORAL | Status: DC
  Administered 2023-08-21 – 2023-09-03 (×27): 20 mg via ORAL
  Filled 2023-08-21 (×29): qty 1

## 2023-08-21 MED ORDER — HYDRALAZINE HCL 25 MG PO TABS
25.0000 mg | ORAL_TABLET | Freq: Three times a day (TID) | ORAL | 0 refills | Status: DC
  Filled 2023-08-21: qty 270, 90d supply, fill #0

## 2023-08-21 MED ORDER — OXYCODONE HCL 5 MG PO TABS
5.0000 mg | ORAL_TABLET | ORAL | 0 refills | Status: DC | PRN
  Filled 2023-08-21: qty 45, 4d supply, fill #0

## 2023-08-21 MED ORDER — ZINC SULFATE 220 (50 ZN) MG PO TABS
220.0000 mg | ORAL_TABLET | Freq: Every day | ORAL | 0 refills | Status: AC
  Filled 2023-08-21: qty 50, 50d supply, fill #0

## 2023-08-21 NOTE — Progress Notes (Signed)
    Durable Medical Equipment  (From admission, onward)           Start     Ordered   08/20/23 1456  For home use only DME standard manual wheelchair with seat cushion  Once       Comments: Patient suffers from left BKA which impairs their ability to perform daily activities like bathing, dressing, and toileting in the home.  A walker will not resolve issue with performing activities of daily living. A wheelchair will allow patient to safely perform daily activities. Patient can safely propel the wheelchair in the home or has a caregiver who can provide assistance. Length of need Lifetime. Accessories: elevating leg rests (ELRs), wheel locks, extensions and anti-tippers.   08/20/23 1458   08/20/23 1456  For home use only DME wheelchair cushion (seat and back)  Once        08/20/23 1458   08/20/23 1456  For home use only DME Bedside commode  Once       Question:  Patient needs a bedside commode to treat with the following condition  Answer:  Debility   08/20/23 1458

## 2023-08-21 NOTE — Progress Notes (Signed)
    Durable Medical Equipment  (From admission, onward)           Start     Ordered   08/21/23 1033  For home use only DME Bedside commode  Once       Comments: CONFINE TO ONE ROOM.  Question:  Patient needs a bedside commode to treat with the following condition  Answer:  Debility   08/21/23 1033   08/20/23 1456  For home use only DME standard manual wheelchair with seat cushion  Once       Comments: Patient suffers from left BKA which impairs their ability to perform daily activities like bathing, dressing, and toileting in the home.  A walker will not resolve issue with performing activities of daily living. A wheelchair will allow patient to safely perform daily activities. Patient can safely propel the wheelchair in the home or has a caregiver who can provide assistance. Length of need Lifetime. Accessories: elevating leg rests (ELRs), wheel locks, extensions and anti-tippers.   08/20/23 1458   08/20/23 1456  For home use only DME wheelchair cushion (seat and back)  Once        08/20/23 1458

## 2023-08-21 NOTE — Progress Notes (Addendum)
PROGRESS NOTE    Earl Calderon  MWN:[id number] DOB: [birth year] DOA: 08/07/2023 PCP: Melvenia Beam, MD  Subjective: Pt seen and examined. Pt not as agitated. Took qpm seroquel last night. Pt c/o of pain. Add po dilaudid. Stopped IV fentanyl CM making preparation for DC tomorrow.   Hospital Course: HPI: Earl Calderon is a 72 y.o. male with medical history significant of CKD, DM, MGUS and documented CHF in chart.  EF was 49% in May, 2024. LVH.    On review of chart, I do not notice any pulmonary pathology being documented.  Patient reports that his baseline functional status is 1 of getting "tired "after doing yard work in the house.  Otherwise he is able to get up and walk around the house without any trouble breathing.  However for the last 2 weeks patient describes a progressive sensation of shortness of breath.  Initially present with exertion and now patient states he is feeling short of breath even at rest.  This prompted the patient to come to the hospital today.  Patient further reports generalized anterior chest discomfort with efforts of breathing.  Patient does not report any fever or any pleuritic chest pain or any cough or leg swelling.   Patient's reason for coming to the hospital are as above per patient.   Further patient reports that he has had chronic leg "sores" for several weeks time, they have been worsening on both lower extremities for the last couple of weeks.  And they are more painful.   Further report is obtained from the ER provider.  Asher Muir Barrett reports that the patient was found to be hypoxic on attempts at ambulation.  However he is not hypoxic at rest.  Oxygen has been given as needed.  Troponins have been mildly elevated, medical evaluation is sought. Review of Systems: Unable to review all systems due to lack of cooperation from patient.  Patient wanted to not answer more for questions or engage in prolonged encounter.  Therefore review of  system was not possible similarly exam was limited.  Antibiotic Therapy: Anti-infectives (From admission, onward)    Start     Dose/Rate Route Frequency Ordered Stop   08/14/23 1015  vancomycin (VANCOCIN) powder  Status:  Discontinued          As needed 08/14/23 1015 08/14/23 1035   08/14/23 0815  ceFAZolin (ANCEF) IVPB 2g/100 mL premix        2 g 200 mL/hr over 30 Minutes Intravenous On call to O.R. 08/14/23 0729 08/14/23 1012   08/11/23 0800  cefTRIAXone (ROCEPHIN) 1 g in sodium chloride 0.9 % 100 mL IVPB        1 g 200 mL/hr over 30 Minutes Intravenous Daily 08/11/23 0559 08/15/23 2359   08/10/23 2200  vancomycin (VANCOCIN) IVPB 1000 mg/200 mL premix  Status:  Discontinued        1,000 mg 200 mL/hr over 60 Minutes Intravenous Every 48 hours 08/09/23 1047 08/10/23 1206   08/10/23 1206  vancomycin variable dose per unstable renal function (pharmacist dosing)  Status:  Discontinued         Does not apply See admin instructions 08/10/23 1206 08/10/23 1218   08/10/23 0600  cefUROXime (ZINACEF) 1.5 g in sodium chloride 0.9 % 100 mL IVPB        1.5 g 200 mL/hr over 30 Minutes Intravenous On call to O.R. 08/09/23 0846 08/11/23 0559   08/09/23 1400  piperacillin-tazobactam (ZOSYN) IVPB 2.25 g  Status:  Discontinued  2.25 g 100 mL/hr over 30 Minutes Intravenous Every 8 hours 08/09/23 1050 08/11/23 0557   08/08/23 2200  vancomycin (VANCOREADY) IVPB 750 mg/150 mL  Status:  Discontinued        750 mg 150 mL/hr over 60 Minutes Intravenous Every 24 hours 08/08/23 0015 08/09/23 1047   08/07/23 2330  Vancomycin (VANCOCIN) 1,500 mg in sodium chloride 0.9 % 500 mL IVPB        1,500 mg 250 mL/hr over 120 Minutes Intravenous  Once 08/07/23 2240 08/08/23 0740   08/07/23 2300  piperacillin-tazobactam (ZOSYN) IVPB 3.375 g  Status:  Discontinued        3.375 g 12.5 mL/hr over 240 Minutes Intravenous Every 8 hours 08/07/23 2240 08/09/23 1050   08/07/23 1715  cefTRIAXone (ROCEPHIN) 1 g in sodium  chloride 0.9 % 100 mL IVPB        1 g 200 mL/hr over 30 Minutes Intravenous  Once 08/07/23 1707 08/07/23 1848       Procedures: 08-10-2023 Aortogram with LE angiogram 08-14-2023 Bilateral BKA  Consultants: Ortho nephrology    Assessment and Plan: * Acute respiratory failure with hypoxia (HCC) On admission, Patient had presented initially for shortness of breath and hypoxia. -Unclear etiology, CTA chest showed no evidence of pulmonary embolus, aortic atherosclerosis, coronary artery calcifications.  No pleural effusion or pneumothorax. -2D echo still pending -O2 sats 100% on 2 L  08-17-2023 pt the time I saw the patient today, his 2D echo showed EF of 55 to 60%, no regional WMA, G2 DD Stable, O2 sats 99% on room air  08-18-2023 on RA. Resolved.  Acute renal failure superimposed on stage 3a chronic kidney disease (HCC) On admission through 12-15, 2024, Patient baseline creatinine between 1.06 and 1.31.  Current creatinine 1.78.  Without associated elevated BUN.  I suspect this may be related to infection and hyperglycemia.  Patient received 500 cc normal saline fluid bolus in the ER.  Check urinalysis sodium creatinine.  Trend creatinine response to 75 cc/h fluid hydration overnight  08-17-2023 Scr 1.49. at baseline.  08-19-2023 will repeat CMP today. As pt not taking in a lot of po fluids. 08-20-2023 Scr stable at 1.52 08-21-2023 Scr 1.38 today. I think restarting aldactone and Entresto back when his AKI is recovering would only get him back into trouble. He is not taking much in the way of PO liquids. May be due to him being in the hospital. I think the right management is to get him home first and see what he does with eating/drinking. Then have him see cardiology in clinic to restart GDMT. He will need close monitoring of his renal function if Entresto and/or aldactone are restarted.  Essential hypertension 08-18-2023 restart scheduled hydralazine 25 mg tid.  08-19-2023 BP stable  on hydralazine 25 mg tid.  08-21-2023 BP elevated but may be due to pain. Add prn labetalol. Monitor BP. Start isordil 20 mg bid to get his back on some CHF meds.  Chronic combined systolic and diastolic heart failure (HCC) Chronically documented. Paitnet does not appear to be fluids overloaded at this time. Med rec pending pharmacy input. Trop of 127 at presentation now down to 39 - over all presentation not consistnt with ACS. EKG showing stable LVH changes since 12/29/2022. See complaint of SOB/hypoxia as well.  08-21-2023 pt's AKI has resolved. Pt's Scr is back to his baseline of 1.2-1.3. pt on hydralazine. His Entresto, aldactone and farxiga were stopped when he was admitted for AKI.  HR at 60  bpm without any betablockers since admission. Cardiology notes reviewed. Pt had been complaining about feeling fatigued despite reduction in beta blocker dosage. I'm concerned about restarting beta blockers at this time due to already low resting HR.  Given his immobility for his bilateral BKA I doubt his resting HR will get above 60 bpm.  Pt already on hydralazine for HTN(25 mg tid). Pt is not taking much in the way of po liquids. I think adding spironolactone and Entresto back into his regimen may only worsen his now recovered AKI on CKD stage 3a. Will start isosorbide dinitrate 20 mg bid. I think he will need close f/u with cards clinic to get him back onto the right CHF meds. Luckily his echo on 08-11-2023 showed improving LVEF of 55-60%. LVEF was 45-50% in May 2024. I think adding his farxiga back is reasonable.  Delirium 08-18-2023 probably some hospital acquired delirium. Start scheduled seroquel 50 mg at 8 pm to head off any sun downing.  08-19-2023 received 50 mg seroquel at 8:12 pm last night. No reported overnight agitation. Continue with seroquel.  08-20-2023 continue with 50 mg seroquel at 8 pm. Will add prn IM geodon. Add am dose of seroquel 25 mg.  08-21-2023 seems to have resolved. Continue  with seroquel 50 mg q 8 pm. Monitor how his AM dose of seroquel works.  Acute postoperative anemia due to expected blood loss 08-17-2023 had 1 unit PRBC transfused on 08-16-2023.  08-19-2023 order CBC today.  08-21-2023 HgB 7.6. this is due to blood loss from daily labs and his bilateral BKA. He will need oral iron therapy at home for a few months. His family doctor can arrange for f/u HgB and IV iron if needed.   S/P bilateral BKA (below knee amputation) (HCC) - on 08/14/2023 08-17-2023 continues with wound vac. Will need SNF at discharge.  08-19-2023 needs SNF at discharge.  08-20-2023 spoke to pt's wife Lamar Laundry today after 4 days of attempting to contact her.  Have never seen the wife at the hospital. Wife is adamant that pt is not going to SNF at discharge. Wife states that pt is going home at discharge and she will provide care. She does not want home PT/OT. Only wants referral to outpatient PT.  08-21-2023 add po dilaudid for pain q2h prn. CM making preparation for DC tomorrow to pt's home as pt's wife refused for pt to go to SNF at discharge.  Hypoxia On admission through 12-15, 2024, This is actually the patient's principal reason for presentation.  Patient reports last couple of weeks of exertional shortness of breath that is progressive and now short of breath even at rest.  CT PE study is actually negative and I do not find marked fluid overload on the limited exam that I could do for the patient before patient interrupted the encounter.  Per report patient was actually hypoxic on ambulation but not at rest.  Given the current state of workup, residual concerns include possible pulmonary artery hypertension, pleural effusion or other limitation of cardiac output.  Especially given the troponin elevation I will go ahead and proceed with an echo at this time and see where that leads [place].  Continue with as needed oxygen.  08-17-2023 resolved.  Gangrene of both feet (HCC) On admission  through 12-15, 2024, , Of b/l lower extermity - foot and ankle. Patient has foul-smelling slightly wet appearing black margin ulcers, see picture in media section of the chart.  Concerning for wet gangrene in this patient  with uncontrolled diabetes mellitus and AKI. No  sepsis. Slight lactic acidosis POA, resolved. Patient has had ankle-brachial index evaluation done, see separate study in the CV procedure section.  Patient has abnormal right and left toe to brachial index.  At this time time MRI is pending to rule out deep osteomyelitis versus occult abscess in the feet.  Patient is s/p Rocephin.  I will draw blood cultures and start the patient on vancomycin and Zosyn.  Patient will need surgical input given the gangrene in his feet. I discussed case with Marisue Humble PA of vascular surgeon. Dr. Hetty Blend is in the OR (he was indirectly made aware of the consult).  08-17-2023 pt is s/p bilateral BKA(trans-tibial). Will need SNF. No need for abx. He has surgical cure of his gangrene.  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  He has an IgG kappa spike. To me, I still think that his diabetes can be a much bigger problem than myeloma will be. Hopefully, he will be able to get the blood sugar under control. We will have to follow him up.  We will have to get him back in about 2 or 3 months for follow-up. Hopefully, his blood sugars would not cause problems for him that we will end him up in the hospital."  08-17-2023 stable.  Type 2 diabetes mellitus with chronic kidney disease, with long-term current use of insulin (HCC) On admission, Associated with hyperglycemia, without evidence of DKA.  Glucose on presentation was 525.  Likely worsened by infection and possible nonadherence to regimen.  Patient received 10 units of subcutaneous insulin in the ER.  I will continue with patient's  remotely documented Levemir 8 units twice daily as well as insulin sliding scale ACHS moderate.  We will trend this.  08-17-2023 on bid levemir and SSI. CBG in mid 200s. Will increase levemir to 10 units BID.   08-19-2023 CBG in acceptable ranges. Continue with levemir 10 units BID. Hold if CBG < 110  08-20-2023 CBG acceptable  08-21-2023 CBG acceptable ranges   DVT prophylaxis: enoxaparin (LOVENOX) injection 40 mg Start: 08/19/23 1000 SCD's Start: 08/14/23 1124 SCDs Start: 08/07/23 2043    Code Status: Full Code Family Communication: no family at bedside. I did talk to pt's wife sonya yesterday and arranged for DC to home on Saturday. Wife refused for pt to go to SNF. CM arranging DME equipment. Disposition Plan: home Reason for continuing need for hospitalization: medically stable for DC.  Objective: Vitals:   08/20/23 0854 08/20/23 1425 08/20/23 2035 08/21/23 0823  BP: (!) 175/72 (!) 154/80 (!) 178/80 (!) 169/69  Pulse: (!) 55 64 66 62  Resp: 18 18 18 16   Temp: 97.6 F (36.4 C)  98 F (36.7 C) 98 F (36.7 C)  TempSrc: Oral  Oral Oral  SpO2: 97% 100% 96% 99%  Weight:      Height:        Intake/Output Summary (Last 24 hours) at 08/21/2023 1446 Last data filed at 08/21/2023 1300 Gross per 24 hour  Intake 720 ml  Output 800 ml  Net -80 ml   Filed Weights   08/07/23 2223 08/14/23 0813  Weight: 64.9 kg 68 kg    Examination:  Physical Exam Vitals and nursing note reviewed.  Constitutional:      General: He is not in acute distress.    Appearance: He is not toxic-appearing or diaphoretic.  HENT:     Head: Normocephalic and atraumatic.     Nose: Nose normal.  Cardiovascular:     Rate and Rhythm: Normal rate and regular rhythm.  Pulmonary:     Effort: Pulmonary effort is normal.     Breath sounds: Normal breath sounds.  Abdominal:     General: Bowel sounds are normal.  Musculoskeletal:     Comments: Bilateral BKA  Skin:    General: Skin is warm and dry.   Neurological:     Comments: Appears angry. He is brooding. Not violent. But acting stubborn.     Data Reviewed: I have personally reviewed following labs and imaging studies  CBC: Recent Labs  Lab 08/16/23 0352 08/16/23 1040 08/17/23 0321 08/19/23 1229  WBC 9.6  --  8.2 9.7  NEUTROABS  --   --   --  7.3  HGB 6.5* 7.4* 7.4* 7.6*  HCT 19.8* 22.1* 22.3* 24.3*  MCV 94.3  --  93.3 97.2  PLT 372  --  351 410*   Basic Metabolic Panel: Recent Labs  Lab 08/16/23 0352 08/17/23 0321 08/19/23 1229 08/21/23 0820  NA 133* 136 135  --   K 4.3 4.5 4.7  --   CL 104 105 103  --   CO2 24 24 25   --   GLUCOSE 229* 145* 129*  --   BUN 41* 32* 40*  --   CREATININE 2.17* 1.49* 1.52* 1.38*  CALCIUM 7.5* 7.5* 8.0*  --    GFR: Estimated Creatinine Clearance: 47.9 mL/min (A) (by C-G formula based on SCr of 1.38 mg/dL (H)). Liver Function Tests: Recent Labs  Lab 08/19/23 1229  AST 18  ALT 10  ALKPHOS 43  BILITOT 0.8  PROT 6.3*  ALBUMIN 1.9*   BNP (last 3 results) Recent Labs    08/07/23 1128  BNP 200.5*   CBG: Recent Labs  Lab 08/20/23 1114 08/20/23 1628 08/20/23 2143 08/21/23 0819 08/21/23 1115  GLUCAP 162* 110* 157* 129* 121*   Anemia Panel: Recent Labs    08/19/23 1822  TIBC 239*  IRON 49    Recent Results (from the past 240 hours)  Surgical pcr screen     Status: None   Collection Time: 08/13/23  6:18 PM   Specimen: Nasal Mucosa; Nasal Swab  Result Value Ref Range Status   MRSA, PCR NEGATIVE NEGATIVE Final   Staphylococcus aureus NEGATIVE NEGATIVE Final    Comment: (NOTE) The Xpert SA Assay (FDA approved for NASAL specimens in patients 78 years of age and older), is one component of a comprehensive surveillance program. It is not intended to diagnose infection nor to guide or monitor treatment. Performed at [facility], [address], [place], [place] [address]      Radiology Studies: No results found.  Scheduled Meds:  vitamin C  1,000  mg Oral Daily   aspirin EC  81 mg Oral Daily   enoxaparin (LOVENOX) injection  40 mg Subcutaneous Q24H   hydrALAZINE  25 mg Oral Q8H   insulin aspart  0-6 Units Subcutaneous TID WC   insulin detemir  10 Units Subcutaneous BID   iron polysaccharides  150 mg Oral Daily   isosorbide dinitrate  20 mg Oral BID   lidocaine  1 patch Transdermal Q24H   nutrition supplement (JUVEN)  1 packet Oral BID BM   QUEtiapine  25 mg Oral Q breakfast   QUEtiapine  50 mg Oral QPM   sodium chloride flush  3 mL Intravenous  Q12H   zinc sulfate (50mg  elemental zinc)  220 mg Oral Daily   Continuous Infusions:   LOS: 14 days   Time spent: 40 minutes  Carollee Herter, DO  [facility]  08/21/2023, 2:46 PM

## 2023-08-21 NOTE — Progress Notes (Signed)
    Durable Medical Equipment  (From admission, onward)           Start     Ordered   08/21/23 1428  For home use only DME Other see comment  Once       Comments: Hoyerlift  Question:  Length of Need  Answer:  6 Months   08/21/23 1427   08/21/23 1033  For home use only DME Bedside commode  Once       Comments: CONFINE TO ONE ROOM.  Question:  Patient needs a bedside commode to treat with the following condition  Answer:  Debility   08/21/23 1033   08/20/23 1456  For home use only DME standard manual wheelchair with seat cushion  Once       Comments: Patient suffers from left BKA which impairs their ability to perform daily activities like bathing, dressing, and toileting in the home.  A walker will not resolve issue with performing activities of daily living. A wheelchair will allow patient to safely perform daily activities. Patient can safely propel the wheelchair in the home or has a caregiver who can provide assistance. Length of need Lifetime. Accessories: elevating leg rests (ELRs), wheel locks, extensions and anti-tippers.   08/20/23 1458   08/20/23 1456  For home use only DME wheelchair cushion (seat and back)  Once        08/20/23 1458

## 2023-08-21 NOTE — Progress Notes (Signed)
Physical Therapy Treatment Patient Details Name: Earl Calderon MRN: [id number] DOB: September 23, 1950 Today's Date: 08/21/2023   History of Present Illness Patient is a 72 yo male presenting to the ED with SOB and fatigue on 08/07/23. Admitted with acute respiratory failure with hypoxia. Found to have bilateral lower extremity wounds, gangrene, chronic limb ischemia. Arteriogram performed on 12/9. 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  PMH includes: CAD, CKD stage III, DM type II, uncontrolled, MGUS, chronic systolic CHF, NSVT, resistant hypertension, hyperlipidemia   PT Comments  Pt in bed upon arrival and agreeable to limited PT session. Pt was distracted about going home and wanted to talk to the case manager and his wife. Pt was agreeable to perform minimal LE exercises in supine w/ no increase in pain. Discussed other amputee exercises in packet with pt verbalizing understanding. Discussed providing tactile stimulation to B LE stumps with pt verbalizing hesitation. Progress limited by pt declining transfers and further mobility. Recommend <3 hrs post acute rehab, however, pt is declining and wishing to go home with appropriate DME. Acute PT to follow.       If plan is discharge home, recommend the following: Two people to help with walking and/or transfers;Assistance with cooking/housework;Assist for transportation;Help with stairs or ramp for entrance;Supervision due to cognitive status   Can travel by private vehicle     No  Equipment Recommendations  Wheelchair (measurements PT);Wheelchair cushion (measurements PT);Hoyer lift;Other (comment);Hospital bed (ramp, drop arm BSC, slide board)       Precautions / Restrictions Precautions Precautions: Fall Restrictions Weight Bearing Restrictions Per Provider Order: Yes RLE Weight Bearing Per Provider Order: Non weight bearing LLE Weight Bearing Per Provider Order: Non weight bearing     Mobility  Bed Mobility   General bed mobility comments:  declined mobility          Cognition Arousal: Alert Behavior During Therapy: WFL for tasks assessed/performed Overall Cognitive Status: No family/caregiver present to determine baseline cognitive functioning  General Comments: appears to have short term memory deficits, did not remember earlier part of conversation with repeated questions. Distracted about calling wife and figuring of d/c plan        Exercises Amputee Exercises Hip ABduction/ADduction: AROM, Both, 5 reps, Supine Knee Flexion: AROM, Both, 5 reps, Supine Knee Extension: AROM, Both, 5 reps, Supine Straight Leg Raises: AROM, Both, 5 reps, Supine    General Comments General comments (skin integrity, edema, etc.): educated pt on providing tactile stimulation to end of limbs. Pt seems apprehensive. Was only agreeable to a few exercises and discussed other amputee exercises      Pertinent Vitals/Pain Pain Assessment Pain Assessment: Faces Faces Pain Scale: Hurts little more Pain Location: R LE stump Pain Descriptors / Indicators: Guarding, Grimacing, Discomfort Pain Intervention(s): Limited activity within patient's tolerance, Monitored during session, Repositioned     PT Goals (current goals can now be found in the care plan section) Acute Rehab PT Goals Patient Stated Goal: to go home and work toward bil prosthetic limbs/walking PT Goal Formulation: With patient Time For Goal Achievement: 08/29/23 Potential to Achieve Goals: Fair Progress towards PT goals: Not progressing toward goals - comment (pt declines mobility)    Frequency    Min 1X/week       AM-PAC PT "6 Clicks" Mobility   Outcome Measure  Help needed turning from your back to your side while in a flat bed without using bedrails?: A Little Help needed moving from lying on your back to sitting on the side of  a flat bed without using bedrails?: A Lot Help needed moving to and from a bed to a chair (including a wheelchair)?: Total Help needed  standing up from a chair using your arms (e.g., wheelchair or bedside chair)?: Total Help needed to walk in hospital room?: Total Help needed climbing 3-5 steps with a railing? : Total 6 Click Score: 9    End of Session   Activity Tolerance: Other (comment) (pt declined further mobility) Patient left: in bed;with call bell/phone within reach;with bed alarm set Nurse Communication: Mobility status PT Visit Diagnosis: Other abnormalities of gait and mobility (R26.89)     Time: 1428-1440 PT Time Calculation (min) (ACUTE ONLY): 12 min  Charges:    $Therapeutic Exercise: 8-22 mins PT General Charges $$ ACUTE PT VISIT: 1 Visit                     [name], PT, DPT Secure Chat Preferred  Rehab Office [phone]   Arturo Morton Brion Aliment 08/21/2023, 3:50 PM

## 2023-08-21 NOTE — Plan of Care (Signed)
  Problem: Metabolic: Goal: Ability to maintain appropriate glucose levels will improve Outcome: Progressing   Problem: Nutritional: Goal: Maintenance of adequate nutrition will improve Outcome: Progressing   Problem: Skin Integrity: Goal: Risk for impaired skin integrity will decrease Outcome: Progressing   

## 2023-08-21 NOTE — Progress Notes (Signed)
Patient refusing vitals, CBG check and medicine this morning, says he needs some time to where he is at.

## 2023-08-21 NOTE — TOC Progression Note (Addendum)
Transition of Care [facility]) - Progression Note    Patient Details  Name: Earl Calderon MRN: [id number] Date of Birth: 07/02/1951  [facility] of [facility]) CM/SW Contact  Epifanio Lesches, RN Phone Number: 08/21/2023, 1:44 PM  Clinical Narrative:     Referral made with Ian Malkin for DME : W/C , rw. Equipment will be delivered to bedside prior to d/c. [facility]  team monitoring and will assist with needs...  08/21/2023 1430 Hoyer lift added to referral for DME with Zach/ [facility]. All equipment ( w/c, bsc , hoyer lift) will be delivered to the home prior to d/c.  08/21/2023 @ 1550 Referral for hospital bed made with [facility]. Equipment will be delivered to pt's home prior to d/c.   Expected Discharge Plan: Home w Home Health Services Barriers to Discharge: Continued Medical Work up  Expected Discharge Plan and Services In-house Referral: Clinical Social Work Discharge Planning Services: CM Consult                     DME Arranged: Bedside commode, Wheelchair manual DME Agency: [facility] Date DME Agency Contacted: 08/21/23 Time DME Agency Contacted: [phone] Representative spoke with at DME Agency: Ian Malkin HH Arranged: PT, OT HH Agency: [facility] Date [facility] Agency Contacted: 08/20/23 Time HH Agency Contacted: 1530 Representative spoke with at [facility] Agency: Amy   Social Determinants of Health (SDOH) Interventions SDOH Screenings   Food Insecurity: No Food Insecurity (08/07/2023)  Housing: Low Risk  (08/07/2023)  Transportation Needs: No Transportation Needs (08/07/2023)  Utilities: Not At Risk (08/07/2023)  Depression (PHQ2-9): Low Risk  (10/18/2020)  Financial Resource Strain: Medium Risk (09/21/2020)  Tobacco Use: Low Risk  (08/14/2023)    Readmission Risk Interventions     No data to display

## 2023-08-21 NOTE — Progress Notes (Signed)
    Durable Medical Equipment  (From admission, onward)           Start     Ordered   08/21/23 1537  For home use only DME Hospital bed  Once       Question Answer Comment  Length of Need 6 Months   Patient has (list medical condition): S/P bilateral BKA (below knee amputation) on 08/14/2023   The above medical condition requires: Patient requires the ability to reposition frequently   Head must be elevated greater than: 30 degrees   Bed type Semi-electric   Hoyer Lift Yes   Support Surface: Gel Overlay      08/21/23 1537   08/21/23 1428  For home use only DME Other see comment  Once       Comments: Hoyerlift  Question:  Length of Need  Answer:  6 Months   08/21/23 1427   08/21/23 1033  For home use only DME Bedside commode  Once       Comments: CONFINE TO ONE ROOM.  Question:  Patient needs a bedside commode to treat with the following condition  Answer:  Debility   08/21/23 1033   08/20/23 1456  For home use only DME standard manual wheelchair with seat cushion  Once       Comments: Patient suffers from left BKA which impairs their ability to perform daily activities like bathing, dressing, and toileting in the home.  A walker will not resolve issue with performing activities of daily living. A wheelchair will allow patient to safely perform daily activities. Patient can safely propel the wheelchair in the home or has a caregiver who can provide assistance. Length of need Lifetime. Accessories: elevating leg rests (ELRs), wheel locks, extensions and anti-tippers.   08/20/23 1458   08/20/23 1456  For home use only DME wheelchair cushion (seat and back)  Once        08/20/23 1458

## 2023-08-22 ENCOUNTER — Other Ambulatory Visit (HOSPITAL_COMMUNITY): Payer: Self-pay

## 2023-08-22 DIAGNOSIS — E11621 Type 2 diabetes mellitus with foot ulcer: Secondary | ICD-10-CM | POA: Diagnosis not present

## 2023-08-22 DIAGNOSIS — I10 Essential (primary) hypertension: Secondary | ICD-10-CM | POA: Diagnosis not present

## 2023-08-22 DIAGNOSIS — I5042 Chronic combined systolic (congestive) and diastolic (congestive) heart failure: Secondary | ICD-10-CM | POA: Diagnosis not present

## 2023-08-22 DIAGNOSIS — Z89512 Acquired absence of left leg below knee: Secondary | ICD-10-CM | POA: Diagnosis not present

## 2023-08-22 LAB — CBC WITH DIFFERENTIAL/PLATELET
Abs Immature Granulocytes: 0.09 10*3/uL — ABNORMAL HIGH (ref 0.00–0.07)
Basophils Absolute: 0 10*3/uL (ref 0.0–0.1)
Basophils Relative: 0 %
Eosinophils Absolute: 0.2 10*3/uL (ref 0.0–0.5)
Eosinophils Relative: 3 %
HCT: 21 % — ABNORMAL LOW (ref 39.0–52.0)
Hemoglobin: 6.7 g/dL — CL (ref 13.0–17.0)
Immature Granulocytes: 1 %
Lymphocytes Relative: 12 %
Lymphs Abs: 1.1 10*3/uL (ref 0.7–4.0)
MCH: 31.2 pg (ref 26.0–34.0)
MCHC: 31.9 g/dL (ref 30.0–36.0)
MCV: 97.7 fL (ref 80.0–100.0)
Monocytes Absolute: 0.6 10*3/uL (ref 0.1–1.0)
Monocytes Relative: 6 %
Neutro Abs: 7.2 10*3/uL (ref 1.7–7.7)
Neutrophils Relative %: 78 %
Platelets: 320 10*3/uL (ref 150–400)
RBC: 2.15 MIL/uL — ABNORMAL LOW (ref 4.22–5.81)
RDW: 16.3 % — ABNORMAL HIGH (ref 11.5–15.5)
WBC: 9.3 10*3/uL (ref 4.0–10.5)
nRBC: 0 % (ref 0.0–0.2)

## 2023-08-22 LAB — COMPREHENSIVE METABOLIC PANEL
ALT: 13 U/L (ref 0–44)
AST: 21 U/L (ref 15–41)
Albumin: 1.7 g/dL — ABNORMAL LOW (ref 3.5–5.0)
Alkaline Phosphatase: 54 U/L (ref 38–126)
Anion gap: 6 (ref 5–15)
BUN: 42 mg/dL — ABNORMAL HIGH (ref 8–23)
CO2: 28 mmol/L (ref 22–32)
Calcium: 8.3 mg/dL — ABNORMAL LOW (ref 8.9–10.3)
Chloride: 101 mmol/L (ref 98–111)
Creatinine, Ser: 1.19 mg/dL (ref 0.61–1.24)
GFR, Estimated: >60 mL/min (ref >60)
Glucose, Bld: 174 mg/dL — ABNORMAL HIGH (ref 70–99)
Potassium: 4.8 mmol/L (ref 3.5–5.1)
Sodium: 135 mmol/L (ref 135–145)
Total Bilirubin: 0.5 mg/dL (ref <1.2)
Total Protein: 6 g/dL — ABNORMAL LOW (ref 6.5–8.1)

## 2023-08-22 LAB — GLUCOSE, CAPILLARY
Glucose-Capillary: 132 mg/dL — ABNORMAL HIGH (ref 70–99)
Glucose-Capillary: 123 mg/dL — ABNORMAL HIGH (ref 70–99)
Glucose-Capillary: 80 mg/dL (ref 70–99)
Glucose-Capillary: 121 mg/dL — ABNORMAL HIGH (ref 70–99)

## 2023-08-22 LAB — PREPARE RBC (CROSSMATCH)

## 2023-08-22 LAB — BRAIN NATRIURETIC PEPTIDE: B Natriuretic Peptide: 103.4 pg/mL — ABNORMAL HIGH (ref 0.0–100.0)

## 2023-08-22 MED ORDER — FUROSEMIDE 10 MG/ML IJ SOLN
20.0000 mg | Freq: Once | INTRAMUSCULAR | Status: AC
  Administered 2023-08-22: 20 mg via INTRAVENOUS
  Filled 2023-08-22: qty 2

## 2023-08-22 MED ORDER — CARVEDILOL 3.125 MG PO TABS
3.1250 mg | ORAL_TABLET | Freq: Two times a day (BID) | ORAL | Status: DC
Start: 2023-08-22 — End: 2023-09-03
  Administered 2023-08-23 – 2023-09-03 (×23): 3.125 mg via ORAL
  Filled 2023-08-22 (×23): qty 1

## 2023-08-22 MED ORDER — SODIUM CHLORIDE 0.9% IV SOLUTION: Freq: Once | INTRAVENOUS | Status: AC

## 2023-08-22 NOTE — Plan of Care (Signed)
  Problem: Education: Goal: Ability to describe self-care measures that may prevent or decrease complications (Diabetes Survival Skills Education) will improve Outcome: Progressing Goal: Individualized Educational Video(s) Outcome: Progressing   Problem: Coping: Goal: Ability to adjust to condition or change in health will improve Outcome: Progressing   Problem: Fluid Volume: Goal: Ability to maintain a balanced intake and output will improve Outcome: Progressing   Problem: Health Behavior/Discharge Planning: Goal: Ability to identify and utilize available resources and services will improve Outcome: Progressing Goal: Ability to manage health-related needs will improve Outcome: Progressing   Problem: Metabolic: Goal: Ability to maintain appropriate glucose levels will improve Outcome: Progressing   Problem: Nutritional: Goal: Maintenance of adequate nutrition will improve Outcome: Progressing Goal: Progress toward achieving an optimal weight will improve Outcome: Progressing   Problem: Skin Integrity: Goal: Risk for impaired skin integrity will decrease Outcome: Progressing   Problem: Tissue Perfusion: Goal: Adequacy of tissue perfusion will improve Outcome: Progressing   Problem: Education: Goal: Knowledge of General Education information will improve Description: Including pain rating scale, medication(s)/side effects and non-pharmacologic comfort measures Outcome: Progressing   Problem: Health Behavior/Discharge Planning: Goal: Ability to manage health-related needs will improve Outcome: Progressing   Problem: Clinical Measurements: Goal: Ability to maintain clinical measurements within normal limits will improve Outcome: Progressing Goal: Will remain free from infection Outcome: Progressing Goal: Diagnostic test results will improve Outcome: Progressing Goal: Respiratory complications will improve Outcome: Progressing Goal: Cardiovascular complication will  be avoided Outcome: Progressing   Problem: Activity: Goal: Risk for activity intolerance will decrease Outcome: Progressing   Problem: Nutrition: Goal: Adequate nutrition will be maintained Outcome: Progressing   Problem: Coping: Goal: Level of anxiety will decrease Outcome: Progressing   Problem: Elimination: Goal: Will not experience complications related to bowel motility Outcome: Progressing Goal: Will not experience complications related to urinary retention Outcome: Progressing   Problem: Pain Management: Goal: General experience of comfort will improve Outcome: Progressing   Problem: Safety: Goal: Ability to remain free from injury will improve Outcome: Progressing   Problem: Skin Integrity: Goal: Risk for impaired skin integrity will decrease Outcome: Progressing   Problem: Education: Goal: Knowledge of the prescribed therapeutic regimen will improve Outcome: Progressing Goal: Ability to verbalize activity precautions or restrictions will improve Outcome: Progressing Goal: Understanding of discharge needs will improve Outcome: Progressing   Problem: Activity: Goal: Ability to perform//tolerate increased activity and mobilize with assistive devices will improve Outcome: Progressing   Problem: Clinical Measurements: Goal: Postoperative complications will be avoided or minimized Outcome: Progressing   Problem: Self-Care: Goal: Ability to meet self-care needs will improve Outcome: Progressing   Problem: Self-Concept: Goal: Ability to maintain and perform role responsibilities to the fullest extent possible will improve Outcome: Progressing   Problem: Pain Management: Goal: Pain level will decrease with appropriate interventions Outcome: Progressing   Problem: Skin Integrity: Goal: Demonstration of wound healing without infection will improve Outcome: Progressing

## 2023-08-22 NOTE — Progress Notes (Signed)
Pt refused carvedilol and vitals to be taken. Stating" not right now" and didn't clarify when he will take it.

## 2023-08-22 NOTE — Progress Notes (Signed)
Patient refused blood transfusion. Bedside RN and including myself (charge nurse) explained to patient about the importance of blood transfusion. Patient continues to refuse blood after a very long conversation with him.  Patient also refused to sign the blood transfusion form. MD made aware.

## 2023-08-22 NOTE — Progress Notes (Signed)
PROGRESS NOTE    Earl Calderon  RJJ:[id number] DOB: 12/06/1950 DOA: 08/07/2023 PCP: Melvenia Beam, MD  Subjective: Pt seen and examined. Pt is remarkably calm this AM. CM called wife yesterday. Wife wanted a bunch of new DME including hospital bed. She adamantly had refused a hospital bed when I spoke to her on 08-20-2023.  CM has arranged for hospital bed and hoyer lift.  Wife had been ADAMANT that she was refusing to place the patient into SNF. See my note from 08-20-2023.  Suddenly at the end of business day yesterday, wife suddenly wanted pt to go to SNF. Wife had bargained for patient to go home with her on Saturday instead of Friday when I spoke to her on 08-20-2023.  CM to contact wife today about her choice of SNF.  Pt has been medically ready for DC for several days now.  Wife's behavior has been a barrier to discharge.   Hospital Course: HPI: Earl Calderon is a 72 y.o. male with medical history significant of CKD, DM, MGUS and documented CHF in chart.  EF was 49% in May, 2024. LVH.    On review of chart, I do not notice any pulmonary pathology being documented.  Patient reports that his baseline functional status is 1 of getting "tired "after doing yard work in the house.  Otherwise he is able to get up and walk around the house without any trouble breathing.  However for the last 2 weeks patient describes a progressive sensation of shortness of breath.  Initially present with exertion and now patient states he is feeling short of breath even at rest.  This prompted the patient to come to the hospital today.  Patient further reports generalized anterior chest discomfort with efforts of breathing.  Patient does not report any fever or any pleuritic chest pain or any cough or leg swelling.   Patient's reason for coming to the hospital are as above per patient.   Further patient reports that he has had chronic leg "sores" for several weeks time, they have been  worsening on both lower extremities for the last couple of weeks.  And they are more painful.   Further report is obtained from the ER provider.  Asher Muir Barrett reports that the patient was found to be hypoxic on attempts at ambulation.  However he is not hypoxic at rest.  Oxygen has been given as needed.  Troponins have been mildly elevated, medical evaluation is sought. Review of Systems: Unable to review all systems due to lack of cooperation from patient.  Patient wanted to not answer more for questions or engage in prolonged encounter.  Therefore review of system was not possible similarly exam was limited.  Antibiotic Therapy: Anti-infectives (From admission, onward)    Start     Dose/Rate Route Frequency Ordered Stop   08/14/23 1015  vancomycin (VANCOCIN) powder  Status:  Discontinued          As needed 08/14/23 1015 08/14/23 1035   08/14/23 0815  ceFAZolin (ANCEF) IVPB 2g/100 mL premix        2 g 200 mL/hr over 30 Minutes Intravenous On call to O.R. 08/14/23 0729 08/14/23 1012   08/11/23 0800  cefTRIAXone (ROCEPHIN) 1 g in sodium chloride 0.9 % 100 mL IVPB        1 g 200 mL/hr over 30 Minutes Intravenous Daily 08/11/23 0559 08/15/23 2359   08/10/23 2200  vancomycin (VANCOCIN) IVPB 1000 mg/200 mL premix  Status:  Discontinued  1,000 mg 200 mL/hr over 60 Minutes Intravenous Every 48 hours 08/09/23 1047 08/10/23 1206   08/10/23 1206  vancomycin variable dose per unstable renal function (pharmacist dosing)  Status:  Discontinued         Does not apply See admin instructions 08/10/23 1206 08/10/23 1218   08/10/23 0600  cefUROXime (ZINACEF) 1.5 g in sodium chloride 0.9 % 100 mL IVPB        1.5 g 200 mL/hr over 30 Minutes Intravenous On call to O.R. 08/09/23 0846 08/11/23 0559   08/09/23 1400  piperacillin-tazobactam (ZOSYN) IVPB 2.25 g  Status:  Discontinued        2.25 g 100 mL/hr over 30 Minutes Intravenous Every 8 hours 08/09/23 1050 08/11/23 0557   08/08/23 2200  vancomycin  (VANCOREADY) IVPB 750 mg/150 mL  Status:  Discontinued        750 mg 150 mL/hr over 60 Minutes Intravenous Every 24 hours 08/08/23 0015 08/09/23 1047   08/07/23 2330  Vancomycin (VANCOCIN) 1,500 mg in sodium chloride 0.9 % 500 mL IVPB        1,500 mg 250 mL/hr over 120 Minutes Intravenous  Once 08/07/23 2240 08/08/23 0740   08/07/23 2300  piperacillin-tazobactam (ZOSYN) IVPB 3.375 g  Status:  Discontinued        3.375 g 12.5 mL/hr over 240 Minutes Intravenous Every 8 hours 08/07/23 2240 08/09/23 1050   08/07/23 1715  cefTRIAXone (ROCEPHIN) 1 g in sodium chloride 0.9 % 100 mL IVPB        1 g 200 mL/hr over 30 Minutes Intravenous  Once 08/07/23 1707 08/07/23 1848       Procedures: 08-10-2023 Aortogram with LE angiogram 08-14-2023 Bilateral BKA  Consultants: Ortho nephrology    Assessment and Plan: * Acute respiratory failure with hypoxia (HCC) On admission, Patient had presented initially for shortness of breath and hypoxia. -Unclear etiology, CTA chest showed no evidence of pulmonary embolus, aortic atherosclerosis, coronary artery calcifications.  No pleural effusion or pneumothorax. -2D echo still pending -O2 sats 100% on 2 L  08-17-2023 pt the time I saw the patient today, his 2D echo showed EF of 55 to 60%, no regional WMA, G2 DD Stable, O2 sats 99% on room air  08-18-2023 on RA. Resolved.  Acute renal failure superimposed on stage 3a chronic kidney disease (HCC) On admission through 12-15, 2024, Patient baseline creatinine between 1.06 and 1.31.  Current creatinine 1.78.  Without associated elevated BUN.  I suspect this may be related to infection and hyperglycemia.  Patient received 500 cc normal saline fluid bolus in the ER.  Check urinalysis sodium creatinine.  Trend creatinine response to 75 cc/h fluid hydration overnight  08-17-2023 Scr 1.49. at baseline.  08-19-2023 will repeat CMP today. As pt not taking in a lot of po fluids. 08-20-2023 Scr stable at 1.52 08-21-2023  Scr 1.38 today. I think restarting aldactone and Entresto back when his AKI is recovering would only get him back into trouble. He is not taking much in the way of PO liquids. May be due to him being in the hospital. I think the right management is to get him home first and see what he does with eating/drinking. Then have him see cardiology in clinic to restart GDMT. He will need close monitoring of his renal function if Entresto and/or aldactone are restarted.  08-22-2023 get labs today.  HR has improved with HR in 70s. BP still elevated. Could add coreg today.  Essential hypertension 08-18-2023 restart scheduled hydralazine  25 mg tid.  08-19-2023 BP stable on hydralazine 25 mg tid.  08-21-2023 BP elevated but may be due to pain. Add prn labetalol. Monitor BP. Start isordil 20 mg bid to get his back on some CHF meds.  08-22-2023 will get labs today and decided about taking coreg 3.125 mg bid.  Chronic combined systolic and diastolic heart failure (HCC) Chronically documented. Paitnet does not appear to be fluids overloaded at this time. Med rec pending pharmacy input. Trop of 127 at presentation now down to 39 - over all presentation not consistnt with ACS. EKG showing stable LVH changes since 12/29/2022. See complaint of SOB/hypoxia as well.  08-21-2023 pt's AKI has resolved. Pt's Scr is back to his baseline of 1.2-1.3. pt on hydralazine. His Entresto, aldactone and farxiga were stopped when he was admitted for AKI.  HR at 60 bpm without any betablockers since admission. Cardiology notes reviewed. Pt had been complaining about feeling fatigued despite reduction in beta blocker dosage. I'm concerned about restarting beta blockers at this time due to already low resting HR.  Given his immobility for his bilateral BKA I doubt his resting HR will get above 60 bpm.  Pt already on hydralazine for HTN(25 mg tid). Pt is not taking much in the way of po liquids. I think adding spironolactone and Entresto  back into his regimen may only worsen his now recovered AKI on CKD stage 3a. Will start isosorbide dinitrate 20 mg bid. I think he will need close f/u with cards clinic to get him back onto the right CHF meds. Luckily his echo on 08-11-2023 showed improving LVEF of 55-60%. LVEF was 45-50% in May 2024. I think adding his farxiga back is reasonable.  08-22-2023 will repeat CBC and CMP today.  As of today, CHF meds include hydralazine 25 mg tid and isordil 20 mg bid. Resting HR is in the 70s today. Will see what his labs look like today and decide if low dose coreg can be started as well as farxiga. He is not taking in enough fluids right now to need scheduled diuretics.  Delirium 08-18-2023 probably some hospital acquired delirium. Start scheduled seroquel 50 mg at 8 pm to head off any sun downing.  08-19-2023 received 50 mg seroquel at 8:12 pm last night. No reported overnight agitation. Continue with seroquel.  08-20-2023 continue with 50 mg seroquel at 8 pm. Will add prn IM geodon. Add am dose of seroquel 25 mg.  08-21-2023 seems to have resolved. Continue with seroquel 50 mg q 8 pm. Monitor how his AM dose of seroquel works.  Acute postoperative anemia due to expected blood loss 08-17-2023 had 1 unit PRBC transfused on 08-16-2023.  08-19-2023 order CBC today.  08-21-2023 HgB 7.6. this is due to blood loss from daily labs and his bilateral BKA. He will need oral iron therapy at home for a few months. His family doctor can arrange for f/u HgB and IV iron if needed.   S/P bilateral BKA (below knee amputation) (HCC) - on 08/14/2023 08-17-2023 continues with wound vac. Will need SNF at discharge.  08-19-2023 needs SNF at discharge.  08-20-2023 spoke to pt's wife Lamar Laundry today after 4 days of attempting to contact her.  Have never seen the wife at the hospital. Wife is adamant that pt is not going to SNF at discharge. Wife states that pt is going home at discharge and she will provide care. She  does not want home PT/OT. Only wants referral to outpatient PT.  08-21-2023  add po dilaudid for pain q2h prn. CM making preparation for DC tomorrow to pt's home as pt's wife refused for pt to go to SNF at discharge.  08-22-2023 wife has been a barrier to discharge. She doesn't answer phone calls. In fact, it took this Clinical research associate 4 days to get in touch with her despite calling her every day. Wife was initially ADAMANTLY against sending pt to SNF. She demanded that pt be sent home with her. All arrangements were made for patient to go home today with various DME. Wife had refused for pt to get Home PT/OT but instead wanted him to get outpatient PT at "[facility]".  At the last moment on the close of business day on Friday, Aug 21, 2023, the wife suddenly refused to take patient home and wanted him to go to SNF.  Hypoxia On admission through 12-15, 2024, This is actually the patient's principal reason for presentation.  Patient reports last couple of weeks of exertional shortness of breath that is progressive and now short of breath even at rest.  CT PE study is actually negative and I do not find marked fluid overload on the limited exam that I could do for the patient before patient interrupted the encounter.  Per report patient was actually hypoxic on ambulation but not at rest.  Given the current state of workup, residual concerns include possible pulmonary artery hypertension, pleural effusion or other limitation of cardiac output.  Especially given the troponin elevation I will go ahead and proceed with an echo at this time and see where that leads [place].  Continue with as needed oxygen.  08-17-2023 resolved.  Gangrene of both feet (HCC) On admission through 12-15, 2024, , Of b/l lower extermity - foot and ankle. Patient has foul-smelling slightly wet appearing black margin ulcers, see picture in media section of the chart.  Concerning for wet gangrene in this patient with  uncontrolled diabetes mellitus and AKI. No  sepsis. Slight lactic acidosis POA, resolved. Patient has had ankle-brachial index evaluation done, see separate study in the CV procedure section.  Patient has abnormal right and left toe to brachial index.  At this time time MRI is pending to rule out deep osteomyelitis versus occult abscess in the feet.  Patient is s/p Rocephin.  I will draw blood cultures and start the patient on vancomycin and Zosyn.  Patient will need surgical input given the gangrene in his feet. I discussed case with Marisue Humble PA of vascular surgeon. Dr. Hetty Blend is in the OR (he was indirectly made aware of the consult).  08-17-2023 pt is s/p bilateral BKA(trans-tibial). Will need SNF. No need for abx. He has surgical cure of his gangrene.  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  He has an IgG kappa spike. To me, I still think that his diabetes can be a much bigger problem than myeloma will be. Hopefully, he will be able to get the blood sugar under control. We will have to follow him up.  We will have to get him back in about 2 or 3 months for follow-up. Hopefully, his blood sugars would not cause problems for him that we will end him up in the hospital."  08-17-2023 stable.  Type 2 diabetes mellitus with chronic kidney disease, with long-term current use of insulin (HCC) On admission, Associated with hyperglycemia, without evidence of DKA.  Glucose on presentation  was 525.  Likely worsened by infection and possible nonadherence to regimen.  Patient received 10 units of subcutaneous insulin in the ER.  I will continue with patient's remotely documented Levemir 8 units twice daily as well as insulin sliding scale ACHS moderate.  We will trend this.  08-17-2023 on bid levemir and SSI. CBG in mid 200s. Will increase levemir to 10 units BID.   08-19-2023 CBG in  acceptable ranges. Continue with levemir 10 units BID. Hold if CBG < 110  08-20-2023 CBG acceptable  08-21-2023 CBG acceptable ranges   DVT prophylaxis: enoxaparin (LOVENOX) injection 40 mg Start: 08/19/23 1000 SCD's Start: 08/14/23 1124 SCDs Start: 08/07/23 2043  Pt is now a bilateral LE amputee    Code Status: Full Code Family Communication: no family at bedside. I spoke to wife on 12-91-2024. Disposition Plan: SNF Reason for continuing need for hospitalization: medically stable for DC to SNF.  Objective: Vitals:   08/21/23 0823 08/21/23 1521 08/21/23 2038 08/22/23 0409  BP: (!) 169/69 (!) 167/67 (!) 155/68 (!) 152/79  Pulse: 62 (!) 59 70 72  Resp: 16 18 16 18   Temp: 98 F (36.7 C) 98.2 F (36.8 C) 98.2 F (36.8 C) 98.6 F (37 C)  TempSrc: Oral Oral    SpO2: 99% 98% 100% 95%  Weight:      Height:        Intake/Output Summary (Last 24 hours) at 08/22/2023 0944 Last data filed at 08/22/2023 0504 Gross per 24 hour  Intake 960 ml  Output 1325 ml  Net -365 ml   Filed Weights   08/07/23 2223 08/14/23 0813  Weight: 64.9 kg 68 kg    Examination:  Physical Exam Vitals and nursing note reviewed.  Constitutional:      General: He is not in acute distress.    Appearance: He is not toxic-appearing or diaphoretic.     Comments: Much more calm today.  HENT:     Head: Normocephalic and atraumatic.     Nose: Nose normal.  Eyes:     General: No scleral icterus. Cardiovascular:     Rate and Rhythm: Normal rate and regular rhythm.  Pulmonary:     Effort: Pulmonary effort is normal.     Breath sounds: Normal breath sounds.  Abdominal:     General: Bowel sounds are normal.     Palpations: Abdomen is soft.  Musculoskeletal:     Comments: Bilateral BKA  Skin:    General: Skin is warm and dry.     Capillary Refill: Capillary refill takes less than 2 seconds.  Neurological:     Mental Status: He is alert.     Comments: Not agitated at all. Very calm today.   Psychiatric:        Behavior: Behavior normal.     Data Reviewed: I have personally reviewed following labs and imaging studies  CBC: Recent Labs  Lab 08/16/23 0352 08/16/23 1040 08/17/23 0321 08/19/23 1229  WBC 9.6  --  8.2 9.7  NEUTROABS  --   --   --  7.3  HGB 6.5* 7.4* 7.4* 7.6*  HCT 19.8* 22.1* 22.3* 24.3*  MCV 94.3  --  93.3 97.2  PLT 372  --  351 410*   Basic Metabolic Panel: Recent Labs  Lab 08/16/23 0352 08/17/23 0321 08/19/23 1229 08/21/23 0820  NA 133* 136 135  --   K 4.3 4.5 4.7  --   CL 104 105 103  --   CO2 24 24 25   --  GLUCOSE 229* 145* 129*  --   BUN 41* 32* 40*  --   CREATININE 2.17* 1.49* 1.52* 1.38*  CALCIUM 7.5* 7.5* 8.0*  --    GFR: Estimated Creatinine Clearance: 47.9 mL/min (A) (by C-G formula based on SCr of 1.38 mg/dL (H)). Liver Function Tests: Recent Labs  Lab 08/19/23 1229  AST 18  ALT 10  ALKPHOS 43  BILITOT 0.8  PROT 6.3*  ALBUMIN 1.9*   BNP (last 3 results) Recent Labs    08/07/23 1128  BNP 200.5*   CBG: Recent Labs  Lab 08/21/23 0819 08/21/23 1115 08/21/23 1659 08/21/23 2204 08/22/23 0900  GLUCAP 129* 121* 230* 152* 132*   Anemia Panel: Recent Labs    08/19/23 1822  TIBC 239*  IRON 49    Recent Results (from the past 240 hours)  Surgical pcr screen     Status: None   Collection Time: 08/13/23  6:18 PM   Specimen: Nasal Mucosa; Nasal Swab  Result Value Ref Range Status   MRSA, PCR NEGATIVE NEGATIVE Final   Staphylococcus aureus NEGATIVE NEGATIVE Final    Comment: (NOTE) The Xpert SA Assay (FDA approved for NASAL specimens in patients 35 years of age and older), is one component of a comprehensive surveillance program. It is not intended to diagnose infection nor to guide or monitor treatment. Performed at [facility], [address] [place], [place] [address]      Radiology Studies: No results found.  Scheduled Meds:  vitamin C  1,000 mg Oral Daily   aspirin EC  81 mg Oral Daily    enoxaparin (LOVENOX) injection  40 mg Subcutaneous Q24H   hydrALAZINE  25 mg Oral Q8H   insulin aspart  0-6 Units Subcutaneous TID WC   insulin detemir  10 Units Subcutaneous BID   iron polysaccharides  150 mg Oral Daily   isosorbide dinitrate  20 mg Oral BID   lidocaine  1 patch Transdermal Q24H   nutrition supplement (JUVEN)  1 packet Oral BID BM   QUEtiapine  25 mg Oral Q breakfast   QUEtiapine  50 mg Oral QPM   sodium chloride flush  3 mL Intravenous Q12H   zinc sulfate (50mg  elemental zinc)  220 mg Oral Daily   Continuous Infusions:   LOS: 15 days   Time spent: 40 minutes  Carollee Herter, DO  [facility]  08/22/2023, 9:44 AM

## 2023-08-22 NOTE — TOC Progression Note (Signed)
Transition of Care [facility]) - Progression Note    Patient Details  Name: Earl Calderon MRN: [id number] Date of Birth: 02-07-1951  [facility] CM/SW Contact  Dellie Burns [place], [place] Phone Number: 08/22/2023, 10:21 AM  Clinical Narrative:  pt and pt's wife now requesting SNF placement instead of pt returning home with [facility].   SW met with pt at bedside, pt's wife on phone during visit. Discussed current SNF bed offers and they have accepted [facility]. Unable to reach [facility] in admissions but notified Whitney Post with regional admissions [phone].   Home and Community/[employer] auth request submitted, reference [id number]. MD updated.   Dellie Burns, MSW, LCSW [phone] (coverage)      Expected Discharge Plan: Home w Home Health Services Barriers to Discharge: Continued Medical Work up  Expected Discharge Plan and Services In-house Referral: Clinical Social Work Discharge Planning Services: CM Consult                     DME Arranged: Other see comment (hoyer lift) DME Agency: [employer] Date DME Agency Contacted: 08/21/23 Time DME Agency Contacted: [phone] Representative spoke with at DME Agency: Ian Malkin HH Arranged: PT, OT HH Agency: [facility] Date [facility] Agency Contacted: 08/20/23 Time HH Agency Contacted: 1530 Representative spoke with at [facility] Agency: Amy   Social Determinants of Health (SDOH) Interventions SDOH Screenings   Food Insecurity: No Food Insecurity (08/07/2023)  Housing: Low Risk  (08/07/2023)  Transportation Needs: No Transportation Needs (08/07/2023)  Utilities: Not At Risk (08/07/2023)  Depression (PHQ2-9): Low Risk  (10/18/2020)  Financial Resource Strain: Medium Risk (09/21/2020)  Tobacco Use: Low Risk  (08/14/2023)    Readmission Risk Interventions     No data to display

## 2023-08-22 NOTE — Plan of Care (Signed)
  Problem: Metabolic: Goal: Ability to maintain appropriate glucose levels will improve Outcome: Progressing   Problem: Activity: Goal: Risk for activity intolerance will decrease Outcome: Progressing   Problem: Nutrition: Goal: Adequate nutrition will be maintained Outcome: Progressing

## 2023-08-22 NOTE — Progress Notes (Signed)
   HgB 6.7 g/dl. Will give 2 units PRBC. Thankfully, his Scr has improved to 1.19   Carollee Herter, DO [facility]

## 2023-08-22 NOTE — Progress Notes (Signed)
This nurse informed the patient around 12:50 PM that his hemoglobin was 6.7. The patient acknowledged the information and did not ask any questions. Blood consent form was signed from previous  blood transfusion on 12/15. Later that afternoon, this nurse returned with blood for the transfusion, but the patient declined, stating, "You are benefiting from this, not me." This nurse and charge RN discussed the risks and benefits of the blood transfusion with the patient, but he still refused. The patient also refused to sign the blood refusal form. Blood was returned to the blood bank. Carollee Herter MD notified.

## 2023-08-22 NOTE — Progress Notes (Signed)
   Received message from pt's bedside RN that pt is refusing PRBC transfusion. Pt did have PRBC transfusion on 08-16-2023 after his bilateral BKA surgery.  Will have bedside RN and charge RN document pt's refusal as well.  Carollee Herter, DO [facility]

## 2023-08-23 DIAGNOSIS — D62 Acute posthemorrhagic anemia: Secondary | ICD-10-CM | POA: Diagnosis not present

## 2023-08-23 DIAGNOSIS — I5042 Chronic combined systolic (congestive) and diastolic (congestive) heart failure: Secondary | ICD-10-CM | POA: Diagnosis not present

## 2023-08-23 DIAGNOSIS — N179 Acute kidney failure, unspecified: Secondary | ICD-10-CM | POA: Diagnosis not present

## 2023-08-23 DIAGNOSIS — J9601 Acute respiratory failure with hypoxia: Secondary | ICD-10-CM | POA: Diagnosis not present

## 2023-08-23 LAB — CBC WITH DIFFERENTIAL/PLATELET
Abs Immature Granulocytes: 0.07 10*3/uL (ref 0.00–0.07)
Abs Immature Granulocytes: 0.06 10*3/uL (ref 0.00–0.07)
Basophils Absolute: 0 10*3/uL (ref 0.0–0.1)
Basophils Absolute: 0.1 10*3/uL (ref 0.0–0.1)
Basophils Relative: 0 %
Basophils Relative: 1 %
Eosinophils Absolute: 0.5 10*3/uL (ref 0.0–0.5)
Eosinophils Absolute: 0.5 10*3/uL (ref 0.0–0.5)
Eosinophils Relative: 5 %
Eosinophils Relative: 5 %
HCT: 21.9 % — ABNORMAL LOW (ref 39.0–52.0)
HCT: 29.1 % — ABNORMAL LOW (ref 39.0–52.0)
Hemoglobin: 6.8 g/dL — CL (ref 13.0–17.0)
Hemoglobin: 9.3 g/dL — ABNORMAL LOW (ref 13.0–17.0)
Immature Granulocytes: 1 %
Immature Granulocytes: 1 %
Lymphocytes Relative: 15 %
Lymphocytes Relative: 11 %
Lymphs Abs: 1.4 10*3/uL (ref 0.7–4.0)
Lymphs Abs: 1 10*3/uL (ref 0.7–4.0)
MCH: 30.8 pg (ref 26.0–34.0)
MCH: 29.3 pg (ref 26.0–34.0)
MCHC: 31.1 g/dL (ref 30.0–36.0)
MCHC: 32 g/dL (ref 30.0–36.0)
MCV: 99.1 fL (ref 80.0–100.0)
MCV: 91.8 fL (ref 80.0–100.0)
Monocytes Absolute: 0.6 10*3/uL (ref 0.1–1.0)
Monocytes Absolute: 0.5 10*3/uL (ref 0.1–1.0)
Monocytes Relative: 6 %
Monocytes Relative: 5 %
Neutro Abs: 6.9 10*3/uL (ref 1.7–7.7)
Neutro Abs: 7.4 10*3/uL (ref 1.7–7.7)
Neutrophils Relative %: 73 %
Neutrophils Relative %: 77 %
Platelets: 286 10*3/uL (ref 150–400)
Platelets: 286 10*3/uL (ref 150–400)
RBC: 2.21 MIL/uL — ABNORMAL LOW (ref 4.22–5.81)
RBC: 3.17 MIL/uL — ABNORMAL LOW (ref 4.22–5.81)
RDW: 16.6 % — ABNORMAL HIGH (ref 11.5–15.5)
RDW: 19.4 % — ABNORMAL HIGH (ref 11.5–15.5)
WBC: 9.5 10*3/uL (ref 4.0–10.5)
WBC: 9.5 10*3/uL (ref 4.0–10.5)
nRBC: 0 % (ref 0.0–0.2)
nRBC: 0 % (ref 0.0–0.2)

## 2023-08-23 LAB — COMPREHENSIVE METABOLIC PANEL
ALT: 12 U/L (ref 0–44)
AST: 17 U/L (ref 15–41)
Albumin: 1.9 g/dL — ABNORMAL LOW (ref 3.5–5.0)
Alkaline Phosphatase: 50 U/L (ref 38–126)
Anion gap: 8 (ref 5–15)
BUN: 44 mg/dL — ABNORMAL HIGH (ref 8–23)
CO2: 28 mmol/L (ref 22–32)
Calcium: 9 mg/dL (ref 8.9–10.3)
Chloride: 102 mmol/L (ref 98–111)
Creatinine, Ser: 1.39 mg/dL — ABNORMAL HIGH (ref 0.61–1.24)
GFR, Estimated: 55 mL/min — ABNORMAL LOW (ref >60)
Glucose, Bld: 111 mg/dL — ABNORMAL HIGH (ref 70–99)
Potassium: 4.6 mmol/L (ref 3.5–5.1)
Sodium: 138 mmol/L (ref 135–145)
Total Bilirubin: 0.6 mg/dL (ref <1.2)
Total Protein: 6.1 g/dL — ABNORMAL LOW (ref 6.5–8.1)

## 2023-08-23 LAB — GLUCOSE, CAPILLARY
Glucose-Capillary: 103 mg/dL — ABNORMAL HIGH (ref 70–99)
Glucose-Capillary: 90 mg/dL (ref 70–99)
Glucose-Capillary: 133 mg/dL — ABNORMAL HIGH (ref 70–99)
Glucose-Capillary: 146 mg/dL — ABNORMAL HIGH (ref 70–99)
Glucose-Capillary: 189 mg/dL — ABNORMAL HIGH (ref 70–99)

## 2023-08-23 LAB — PREPARE RBC (CROSSMATCH)

## 2023-08-23 MED ORDER — QUETIAPINE FUMARATE 25 MG PO TABS: 25.0000 mg | ORAL_TABLET | Freq: Every day | ORAL | Status: DC

## 2023-08-23 MED ORDER — SODIUM CHLORIDE 0.9% IV SOLUTION: Freq: Once | INTRAVENOUS | Status: AC

## 2023-08-23 MED ORDER — INSULIN DETEMIR 100 UNIT/ML ~~LOC~~ SOLN: 10.0000 [IU] | Freq: Two times a day (BID) | SUBCUTANEOUS | Status: AC

## 2023-08-23 MED ORDER — ISOSORBIDE DINITRATE 20 MG PO TABS: 20.0000 mg | ORAL_TABLET | Freq: Two times a day (BID) | ORAL | Status: AC

## 2023-08-23 MED ORDER — OXYCODONE HCL 5 MG PO TABS: 5.0000 mg | ORAL_TABLET | ORAL | 0 refills | Status: AC | PRN

## 2023-08-23 MED ORDER — CARVEDILOL 3.125 MG PO TABS: 3.1250 mg | ORAL_TABLET | Freq: Two times a day (BID) | ORAL | Status: AC

## 2023-08-23 MED ORDER — QUETIAPINE FUMARATE 50 MG PO TABS: 50.0000 mg | ORAL_TABLET | Freq: Every evening | ORAL | Status: DC

## 2023-08-23 MED ORDER — INSULIN ASPART 100 UNIT/ML IJ SOLN: 0.0000 [IU] | Freq: Three times a day (TID) | INTRAMUSCULAR | Status: AC

## 2023-08-23 NOTE — Discharge Summary (Signed)
[facility] Phys[facility] Interim Hipolito Bayley   Patient name: Earl Calderon  Admit date:     08/07/2023  Interim Summary date: 08/23/2023  Attending Physician: Nolberto Hanlon [[id number]]  Discharge Physician: Carollee Herter   PCP: Melvenia Beam, MD  Admitted From: Home  Recommendations for Outpatient Follow-up:  Follow up with PCP in 1-2 weeks Follow up with Dr. Lajoyce Corners in orthopedics in 4 weeks. SNF to call for appointment. Office # [phone] Follow up with Dr. Duke Salvia with [facility] cardiology in 4 weeks. SNF to call for appointment. Office # [phone] Please follow up on the following pending results:  Home Health:No Equipment/Devices: None  Discharge Condition:Stable CODE STATUS:FULL Diet recommendation: Diabetic Fluid Restriction: None  Hospital Summary: HPI: Earl Calderon is a 72 y.o. male with medical history significant of CKD, DM, MGUS and documented CHF in chart.  EF was 49% in May, 2024. LVH.    On review of chart, I do not notice any pulmonary pathology being documented.  Patient reports that his baseline functional status is 1 of getting "tired "after doing yard work in the house.  Otherwise he is able to get up and walk around the house without any trouble breathing.  However for the last 2 weeks patient describes a progressive sensation of shortness of breath.  Initially present with exertion and now patient states he is feeling short of breath even at rest.  This prompted the patient to come to the hospital today.  Patient further reports generalized anterior chest discomfort with efforts of breathing.  Patient does not report any fever or any pleuritic chest pain or any cough or leg swelling.   Patient's reason for coming to the hospital are as above per patient.   Further patient reports that he has had chronic leg "sores" for several weeks time, they have been worsening on both lower extremities for the last couple of weeks.  And they are more painful.    Further report is obtained from the ER provider.  Asher Muir Barrett reports that the patient was found to be hypoxic on attempts at ambulation.  However he is not hypoxic at rest.  Oxygen has been given as needed.  Troponins have been mildly elevated, medical evaluation is sought. Review of Systems: Unable to review all systems due to lack of cooperation from patient.  Patient wanted to not answer more for questions or engage in prolonged encounter.  Therefore review of system was not possible similarly exam was limited.  Antibiotic Therapy: Anti-infectives (From admission, onward)    Start     Dose/Rate Route Frequency Ordered Stop   08/14/23 1015  vancomycin (VANCOCIN) powder  Status:  Discontinued          As needed 08/14/23 1015 08/14/23 1035   08/14/23 0815  ceFAZolin (ANCEF) IVPB 2g/100 mL premix        2 g 200 mL/hr over 30 Minutes Intravenous On call to O.R. 08/14/23 0729 08/14/23 1012   08/11/23 0800  cefTRIAXone (ROCEPHIN) 1 g in sodium chloride 0.9 % 100 mL IVPB        1 g 200 mL/hr over 30 Minutes Intravenous Daily 08/11/23 0559 08/15/23 2359   08/10/23 2200  vancomycin (VANCOCIN) IVPB 1000 mg/200 mL premix  Status:  Discontinued        1,000 mg 200 mL/hr over 60 Minutes Intravenous Every 48 hours 08/09/23 1047 08/10/23 1206   08/10/23 1206  vancomycin variable dose per unstable renal function (pharmacist dosing)  Status:  Discontinued  Does not apply See admin instructions 08/10/23 1206 08/10/23 1218   08/10/23 0600  cefUROXime (ZINACEF) 1.5 g in sodium chloride 0.9 % 100 mL IVPB        1.5 g 200 mL/hr over 30 Minutes Intravenous On call to O.R. 08/09/23 0846 08/11/23 0559   08/09/23 1400  piperacillin-tazobactam (ZOSYN) IVPB 2.25 g  Status:  Discontinued        2.25 g 100 mL/hr over 30 Minutes Intravenous Every 8 hours 08/09/23 1050 08/11/23 0557   08/08/23 2200  vancomycin (VANCOREADY) IVPB 750 mg/150 mL  Status:  Discontinued        750 mg 150 mL/hr over 60 Minutes  Intravenous Every 24 hours 08/08/23 0015 08/09/23 1047   08/07/23 2330  Vancomycin (VANCOCIN) 1,500 mg in sodium chloride 0.9 % 500 mL IVPB        1,500 mg 250 mL/hr over 120 Minutes Intravenous  Once 08/07/23 2240 08/08/23 0740   08/07/23 2300  piperacillin-tazobactam (ZOSYN) IVPB 3.375 g  Status:  Discontinued        3.375 g 12.5 mL/hr over 240 Minutes Intravenous Every 8 hours 08/07/23 2240 08/09/23 1050   08/07/23 1715  cefTRIAXone (ROCEPHIN) 1 g in sodium chloride 0.9 % 100 mL IVPB        1 g 200 mL/hr over 30 Minutes Intravenous  Once 08/07/23 1707 08/07/23 1848       Procedures: 08-10-2023 Aortogram with LE angiogram 08-14-2023 Bilateral BKA  Consultants: Ortho nephrology   [facility] Course by Problem: * Acute respiratory failure with hypoxia (HCC) On admission, Patient had presented initially for shortness of breath and hypoxia. -Unclear etiology, CTA chest showed no evidence of pulmonary embolus, aortic atherosclerosis, coronary artery calcifications.  No pleural effusion or pneumothorax. -2D echo still pending -O2 sats 100% on 2 L  08-17-2023 pt the time I saw the patient today, his 2D echo showed EF of 55 to 60%, no regional WMA, G2 DD Stable, O2 sats 99% on room air  08-18-2023 on RA. Resolved.  Acute postoperative anemia due to expected blood loss 08-17-2023 had 1 unit PRBC transfused on 08-16-2023.  08-19-2023 order CBC today.  08-21-2023 HgB 7.6. this is due to blood loss from daily labs and his bilateral BKA. He will need oral iron therapy at home for a few months. His family doctor can arrange for f/u HgB and IV iron if needed.   08-22-2023 HgB came back at 6.7 g/dl. 2 units PRBC ordered for transfusion. Pt refusing PRBC transfusion. Pt's bedside RN and charge RN will also document pt's refusal of PRBC transfusion  08-23-2023 pt refused PRBC yesterday. Pt is agreeable to receiving 2 unit PRBC transfusion today.  Acute renal failure superimposed on stage 3a  chronic kidney disease (HCC) On admission through 12-15, 2024, Patient baseline creatinine between 1.06 and 1.31.  Current creatinine 1.78.  Without associated elevated BUN.  I suspect this may be related to infection and hyperglycemia.  Patient received 500 cc normal saline fluid bolus in the ER.  Check urinalysis sodium creatinine.  Trend creatinine response to 75 cc/h fluid hydration overnight  08-17-2023 Scr 1.49. at baseline.  08-19-2023 will repeat CMP today. As pt not taking in a lot of po fluids. 08-20-2023 Scr stable at 1.52 08-21-2023 Scr 1.38 today. I think restarting aldactone and Entresto back when his AKI is recovering would only get him back into trouble. He is not taking much in the way of PO liquids. May be due to him being in  the hospital. I think the right management is to get him home first and see what he does with eating/drinking. Then have him see cardiology in clinic to restart GDMT. He will need close monitoring of his renal function if Entresto and/or aldactone are restarted.  08-22-2023 get labs today.  HR has improved with HR in 70s. BP still elevated. Could add coreg today.  08-23-2023 pt did receive dose of lasix that was meant for PRBC transfusion(that he did not receive yesterday). Scr up to 1.39 as expected. Pt is agreeable to PRBC transfusion today. Hopefully his renal perfusion will improve with PRBC transfusion and Scr will continue to trend down.  Essential hypertension 08-18-2023 restart scheduled hydralazine 25 mg tid.  08-19-2023 BP stable on hydralazine 25 mg tid.  08-21-2023 BP elevated but may be due to pain. Add prn labetalol. Monitor BP. Start isordil 20 mg bid to get his back on some CHF meds.  08-22-2023 will get labs today and decided about taking coreg 3.125 mg bid.  08-23-2023 BP stable. On coreg 3.125 mg bid, isordil 20 mg bid, hydralazine 25 mg tid.  Chronic combined systolic and diastolic heart failure (HCC) Chronically documented. [place]  does not appear to be fluids overloaded at this time. Med rec pending pharmacy input. Trop of 127 at presentation now down to 39 - over all presentation not consistnt with ACS. EKG showing stable LVH changes since 12/29/2022. See complaint of SOB/hypoxia as well.  08-21-2023 pt's AKI has resolved. Pt's Scr is back to his baseline of 1.2-1.3. pt on hydralazine. His Entresto, aldactone and farxiga were stopped when he was admitted for AKI.  HR at 60 bpm without any betablockers since admission. Cardiology notes reviewed. Pt had been complaining about feeling fatigued despite reduction in beta blocker dosage. I'm concerned about restarting beta blockers at this time due to already low resting HR.  Given his immobility for his bilateral BKA I doubt his resting HR will get above 60 bpm.  Pt already on hydralazine for HTN(25 mg tid). Pt is not taking much in the way of po liquids. I think adding spironolactone and Entresto back into his regimen may only worsen his now recovered AKI on CKD stage 3a. Will start isosorbide dinitrate 20 mg bid. I think he will need close f/u with cards clinic to get him back onto the right CHF meds. Luckily his echo on 08-11-2023 showed improving LVEF of 55-60%. LVEF was 45-50% in May 2024. I think adding his farxiga back is reasonable.  08-22-2023 will repeat CBC and CMP today.  As of today, CHF meds include hydralazine 25 mg tid and isordil 20 mg bid. Resting HR is in the 70s today. Will see what his labs look like today and decide if low dose coreg can be started as well as farxiga. He is not taking in enough fluids right now to need scheduled diuretics.  08-23-2023 pt refused PRBC transfusion yesterday. He is agreeable today. He did receive dose of lasix yesterday that was meant to be given in-between PRBC transfusion..that he ultimately did not receive yesterday.  With pt agreeing to PRBC transfusion today, hopefully his HgB will be greater than 8 g/dl. BP has improved with the  changes. He remains on hydralazine 25 mg tid, isordil 20 mg bid. Coreg 3.125 mg bid added yesterday. HR in the mid 50s bpm today. Acceptable Heart rates. I still don't think he is ready to restart Entresto. Will see what his renal function is like tomorrow and make  decision on adding back [place].  Delirium 08-18-2023 probably some hospital acquired delirium. Start scheduled seroquel 50 mg at 8 pm to head off any sun downing.  08-19-2023 received 50 mg seroquel at 8:12 pm last night. No reported overnight agitation. Continue with seroquel.  08-20-2023 continue with 50 mg seroquel at 8 pm. Will add prn IM geodon. Add am dose of seroquel 25 mg.  08-21-2023 seems to have resolved. Continue with seroquel 50 mg q 8 pm. Monitor how his AM dose of seroquel works.  08-23-2023 pt's delirium improving on scheduled AM(25 mg) and PM(50 mg) Seroquel. Continue.  S/P bilateral BKA (below knee amputation) (HCC) - on 08/14/2023 08-17-2023 continues with wound vac. Will need SNF at discharge.  08-19-2023 needs SNF at discharge.  08-20-2023 spoke to pt's wife Lamar Laundry today after 4 days of attempting to contact her.  Have never seen the wife at the hospital. Wife is adamant that pt is not going to SNF at discharge. Wife states that pt is going home at discharge and she will provide care. She does not want home PT/OT. Only wants referral to outpatient PT.  08-21-2023 add po dilaudid for pain q2h prn. CM making preparation for DC tomorrow to pt's home as pt's wife refused for pt to go to SNF at discharge.  08-22-2023 wife has been a barrier to discharge. She doesn't answer phone calls. In fact, it took this Clinical research associate 4 days to get in touch with her despite calling her every day. Wife was initially ADAMANTLY against sending pt to SNF. She demanded that pt be sent home with her. All arrangements were made for patient to go home today with various DME. Wife had refused for pt to get Home PT/OT but instead wanted him to get  outpatient PT at "[facility]".  At the last moment on the close of business day on Friday, Aug 21, 2023, the wife suddenly refused to take patient home and wanted him to go to SNF.  08-23-2023 plan for DC to SNF in [place], [place]. CM involved.  Hypoxia On admission through 12-15, 2024, This is actually the patient's principal reason for presentation.  Patient reports last couple of weeks of exertional shortness of breath that is progressive and now short of breath even at rest.  CT PE study is actually negative and I do not find marked fluid overload on the limited exam that I could do for the patient before patient interrupted the encounter.  Per report patient was actually hypoxic on ambulation but not at rest.  Given the current state of workup, residual concerns include possible pulmonary artery hypertension, pleural effusion or other limitation of cardiac output.  Especially given the troponin elevation I will go ahead and proceed with an echo at this time and see where that leads [place].  Continue with as needed oxygen.  08-17-2023 resolved.  Gangrene of both feet (HCC) On admission through 12-15, 2024, , Of b/l lower extermity - foot and ankle. Patient has foul-smelling slightly wet appearing black margin ulcers, see picture in media section of the chart.  Concerning for wet gangrene in this patient with uncontrolled diabetes mellitus and AKI. No  sepsis. Slight lactic acidosis POA, resolved. Patient has had ankle-brachial index evaluation done, see separate study in the CV procedure section.  Patient has abnormal right and left toe to brachial index.  At this time time MRI is pending to rule out deep osteomyelitis versus occult abscess in the feet.  Patient is s/p  Rocephin.  I will draw blood cultures and start the patient on vancomycin and Zosyn.  Patient will need surgical input given the gangrene in his feet. I discussed case with Marisue Humble PA of vascular surgeon. Dr. Hetty Blend  is in the OR (he was indirectly made aware of the consult).  08-17-2023 pt is s/p bilateral BKA(trans-tibial). Will need SNF. No need for abx. He has surgical cure of his gangrene.  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  He has an IgG kappa spike. To me, I still think that his diabetes can be a much bigger problem than myeloma will be. Hopefully, he will be able to get the blood sugar under control. We will have to follow him up.  We will have to get him back in about 2 or 3 months for follow-up. Hopefully, his blood sugars would not cause problems for him that we will end him up in the hospital."  08-17-2023 stable.  Type 2 diabetes mellitus with chronic kidney disease, with long-term current use of insulin (HCC) On admission, Associated with hyperglycemia, without evidence of DKA.  Glucose on presentation was 525.  Likely worsened by infection and possible nonadherence to regimen.  Patient received 10 units of subcutaneous insulin in the ER.  I will continue with patient's remotely documented Levemir 8 units twice daily as well as insulin sliding scale ACHS moderate.  We will trend this.  08-17-2023 on bid levemir and SSI. CBG in mid 200s. Will increase levemir to 10 units BID.   08-19-2023 CBG in acceptable ranges. Continue with levemir 10 units BID. Hold if CBG < 110  08-20-2023 CBG acceptable  08-21-2023 to 08-23-2023 CBG acceptable ranges    Discharge Diagnoses:  Principal Problem:   Acute respiratory failure with hypoxia [facility]) Active Problems:   Chronic combined systolic and diastolic heart failure (HCC)   Essential hypertension   Acute renal failure superimposed on stage 3a chronic kidney disease (HCC)   Acute postoperative anemia due to expected blood loss   Gangrene of both feet (HCC)   Hypoxia   S/P bilateral BKA (below knee amputation) (HCC) -  on 08/14/2023   Delirium   Type 2 diabetes mellitus with chronic kidney disease, with long-term current use of insulin (HCC)   MGUS (monoclonal gammopathy of unknown significance)   Diabetes mellitus with foot ulcer and gangrene (HCC)   Ulcer of both feet, limited to breakdown of skin [facility])   Discharge Instructions  Discharge Instructions     Call MD for:  difficulty breathing, headache or visual disturbances   Complete by: As directed    Call MD for:  extreme fatigue   Complete by: As directed    Call MD for:  persistant dizziness or light-headedness   Complete by: As directed    Call MD for:  persistant nausea and vomiting   Complete by: As directed    Call MD for:  redness, tenderness, or signs of infection (pain, swelling, redness, odor or green/yellow discharge around incision site)   Complete by: As directed    Call MD for:  severe uncontrolled pain   Complete by: As directed    Call MD for:  temperature >100.4   Complete by: As directed    Diet - low sodium heart healthy   Complete by: As directed    Discharge instructions   Complete by: As directed    1. Follow up  with Dr. Audrie Lia office([phone]) in 4 weeks. SNF to call for appointment. 2. Follow up with [facility] Cardiology Dr. Duke Salvia in 4 weeks. SNF to call for appointment. [phone] 3. For any medical emergencies, transport patient to the closest possible Emergency Room.   Discharge wound care:   Complete by: As directed    1. Perform this daily to both BKA sites.  Wash surgical sites with soap and warm water.  Pat dry. Do NOT rub dry.  Paint surgical incision site with betadine.  Apply 4 x 4 gauze plus Ace wrap and the stump shrinker on top.  Do this once a day to both left and right BKA stumps.   Increase activity slowly   Complete by: As directed       Allergies as of 08/23/2023   No Known Allergies      Medication List     PAUSE taking these medications    Entresto 97-103 MG Wait to take this  until your doctor or other care provider tells you to start again. Generic drug: sacubitril-valsartan TAKE ONE TABLET BY MOUTH TWO TIMES DAILY   furosemide 40 MG tablet Wait to take this until your doctor or other care provider tells you to start again. Commonly known as: LASIX TAKE 1 TABLET BY MOUTH ONCE DAILY   spironolactone 25 MG tablet Wait to take this until your doctor or other care provider tells you to start again. Commonly known as: ALDACTONE TAKE 1 TABLET BY MOUTH ONCE DAILY       STOP taking these medications    APPLE CIDER VINEGAR PO   ELDERBERRY PO   FISH OIL PO   Levemir FlexTouch 100 UNIT/ML FlexTouch Pen Generic drug: insulin detemir Replaced by: insulin detemir 100 UNIT/ML injection   Novofine Pen Needle 32G X 6 MM Misc Generic drug: Insulin Pen Needle   Turmeric 500 MG Tabs   VITAMIN D2 PO       TAKE these medications    aspirin EC 81 MG tablet Commonly known as: Aspirin Low Dose Take 1 tablet (81 mg total) by mouth daily. Swallow whole.   carvedilol 3.125 MG tablet Commonly known as: COREG Take 1 tablet (3.125 mg total) by mouth 2 (two) times daily with a meal. What changed:  medication strength how much to take   ferrous sulfate 325 (65 FE) MG tablet Take 1 tablet (325 mg total) by mouth daily with breakfast.   FREESTYLE TEST STRIPS test strip Generic drug: glucose blood Use as instructed   HumaLOG KwikPen 100 UNIT/ML KwikPen Generic drug: insulin lispro Inject into the skin. Sliding Scale   hydrALAZINE 25 MG tablet Commonly known as: APRESOLINE Take 1 tablet (25 mg total) by mouth 3 (three) times daily. What changed: See the new instructions.   insulin aspart 100 UNIT/ML injection Commonly known as: novoLOG Inject 0-6 Units into the skin 3 (three) times daily with meals.   insulin detemir 100 UNIT/ML injection Commonly known as: LEVEMIR Inject 0.1 mLs (10 Units total) into the skin 2 (two) times daily. Hold if blood sugar  less than 100 Replaces: Levemir FlexTouch 100 UNIT/ML FlexTouch Pen   isosorbide dinitrate 20 MG tablet Commonly known as: ISORDIL Take 1 tablet (20 mg total) by mouth 2 (two) times daily.   Jardiance 10 MG Tabs tablet Generic drug: empagliflozin TAKE ONE TABLET BY MOUTH DAILY BEFORE BREAKFAST   nitroGLYCERIN 0.4 MG SL tablet Commonly known as: NITROSTAT Place 1 tablet (0.4 mg total) under the tongue every 5 (  five) minutes as needed for chest pain.   oxyCODONE 5 MG immediate release tablet Commonly known as: Oxy IR/ROXICODONE Take 1-2 tablets (5-10 mg total) by mouth every 4 (four) hours as needed for up to 5 days for moderate pain (pain score 4-6).   Probiotic Multi-Enzyme Tabs Take 3 tablets by mouth daily at 6 (six) AM.   QUEtiapine 50 MG tablet Commonly known as: SEROQUEL Take 1 tablet (50 mg total) by mouth every evening. Around 8 pm   QUEtiapine 25 MG tablet Commonly known as: SEROQUEL Take 1 tablet (25 mg total) by mouth daily with breakfast. Start taking on: August 24, 2023   rosuvastatin 40 MG tablet Commonly known as: CRESTOR TAKE 1 TABLET BY MOUTH ONCE DAILY AT 6PM   vitamin C 1000 MG tablet Take 1 tablet (1,000 mg total) by mouth daily. Take with iron pill   Zinc Sulfate 220 (50 Zn) MG Tabs Take 1 tablet (220 mg total) by mouth daily.               Durable Medical Equipment  (From admission, onward)           Start     Ordered   08/21/23 1537  For home use only DME Hospital bed  Once       Question Answer Comment  Length of Need 6 Months   Patient has (list medical condition): S/P bilateral BKA (below knee amputation) on 08/14/2023   The above medical condition requires: Patient requires the ability to reposition frequently   Head must be elevated greater than: 30 degrees   Bed type Semi-electric   Hoyer Lift Yes   Support Surface: Gel Overlay      08/21/23 1537   08/21/23 1428  For home use only DME Other see comment  Once        Comments: Hoyerlift  Question:  Length of Need  Answer:  6 Months   08/21/23 1427   08/21/23 1033  For home use only DME Bedside commode  Once       Comments: CONFINE TO ONE ROOM.  Question:  Patient needs a bedside commode to treat with the following condition  Answer:  Debility   08/21/23 1033   08/20/23 1456  For home use only DME standard manual wheelchair with seat cushion  Once       Comments: Patient suffers from left BKA which impairs their ability to perform daily activities like bathing, dressing, and toileting in the home.  A walker will not resolve issue with performing activities of daily living. A wheelchair will allow patient to safely perform daily activities. Patient can safely propel the wheelchair in the home or has a caregiver who can provide assistance. Length of need Lifetime. Accessories: elevating leg rests (ELRs), wheel locks, extensions and anti-tippers.   08/20/23 1458   08/20/23 1456  For home use only DME wheelchair cushion (seat and back)  Once        08/20/23 1458              Discharge Care Instructions  (From admission, onward)           Start     Ordered   08/23/23 0000  Discharge wound care:       Comments: 1. Perform this daily to both BKA sites.  Wash surgical sites with soap and warm water.  Pat dry. Do NOT rub dry.  Paint surgical incision site with betadine.  Apply 4 x 4 gauze plus  Ace wrap and the stump shrinker on top.  Do this once a day to both left and right BKA stumps.   08/23/23 1545            Contact information for follow-up providers     Nadara Mustard, MD Follow up in 1 week(s).   Specialty: Orthopedic Surgery Contact information: [address] [place] [address] [phone]         Melvenia Beam, MD Follow up.   Specialty: Family Medicine Contact information: [address]  [address] [place] [address] [phone]         [facility] Follow up.   Why: [facility] will provide home health PT and OT services Contact information: [address] [place] [address] [phone]              Contact information for after-discharge care     Destination     [facility] .   Service: Skilled Nursing Contact information: [address] [place] [address] [phone]                    No Known Allergies  Discharge Exam: Vitals:   08/23/23 1409 08/23/23 1454  BP: (!) 141/67 (!) 160/77  Pulse: (!) 52 (!) 57  Resp: 14 16  Temp: 97.8 F (36.6 C) (!) 97.4 F (36.3 C)  SpO2: 94% 94%    Physical Exam Vitals and nursing note reviewed.  HENT:     Head: Normocephalic and atraumatic.     Nose: Nose normal.  Cardiovascular:     Rate and Rhythm: Normal rate and regular rhythm.  Pulmonary:     Effort: Pulmonary effort is normal.     Breath sounds: Normal breath sounds.  Abdominal:     General: Abdomen is flat. Bowel sounds are normal.  Musculoskeletal:     Comments: Bilateral BKAs  Skin:    General: Skin is warm and dry.     Capillary Refill: Capillary refill takes less than 2 seconds.  Neurological:     Mental Status: He is alert. He is disoriented.     Comments: Pleasant and not agitated Knows he is in the hospital but does not know which one. Not oriented to month. Knows year     The results of significant diagnostics from this hospitalization (including imaging, microbiology, ancillary and laboratory) are listed below for reference.    Microbiology: Recent Results (from the past 240 hours)  Surgical pcr screen     Status: None   Collection Time: 08/13/23  6:18 PM   Specimen: Nasal Mucosa; Nasal Swab  Result Value Ref Range Status   MRSA, PCR NEGATIVE NEGATIVE Final   Staphylococcus aureus NEGATIVE NEGATIVE Final    Comment: (NOTE) The Xpert SA Assay (FDA approved for NASAL specimens in patients 72 years of age and older), is one component of a  comprehensive surveillance program. It is not intended to diagnose infection nor to guide or monitor treatment. Performed at [facility], [address] [place], [place] [address]      Labs: BNP (last 3 results) Recent Labs    08/07/23 1128 08/22/23 1022  BNP 200.5* 103.4*   Basic Metabolic Panel: Recent Labs  Lab 08/17/23 0321 08/19/23 1229 08/21/23 0820 08/22/23 1022 08/23/23 0607  NA 136 135  --  135 138  K 4.5 4.7  --  4.8 4.6  CL 105 103  --  101 102  CO2 24 25  --  28 28  GLUCOSE 145* 129*  --  174* 111*  BUN 32* 40*  --  42* 44*  CREATININE 1.49* 1.52* 1.38* 1.19 1.39*  CALCIUM 7.5* 8.0*  --  8.3* 9.0   Liver Function Tests: Recent Labs  Lab 08/19/23 1229 08/22/23 1022 08/23/23 0607  AST 18 21 17   ALT 10 13 12   ALKPHOS 43 54 50  BILITOT 0.8 0.5 0.6  PROT 6.3* 6.0* 6.1*  ALBUMIN 1.9* 1.7* 1.9*   CBC: Recent Labs  Lab 08/17/23 0321 08/19/23 1229 08/22/23 1022 08/23/23 0607  WBC 8.2 9.7 9.3 9.5  NEUTROABS  --  7.3 7.2 6.9  HGB 7.4* 7.6* 6.7* 6.8*  HCT 22.3* 24.3* 21.0* 21.9*  MCV 93.3 97.2 97.7 99.1  PLT 351 410* 320 286   BNP: Recent Labs  Lab 08/22/23 1022  BNP 103.4*   CBG: Recent Labs  Lab 08/22/23 1625 08/22/23 2117 08/23/23 0603 08/23/23 0900 08/23/23 1254  GLUCAP 80 121* 103* 90 133*   Urinalysis    Component Value Date/Time   COLORURINE YELLOW 08/07/2023 2052   APPEARANCEUR HAZY (A) 08/07/2023 2052   LABSPEC 1.026 08/07/2023 2052   PHURINE 5.0 08/07/2023 2052   GLUCOSEU >=500 (A) 08/07/2023 2052   HGBUR SMALL (A) 08/07/2023 2052   BILIRUBINUR NEGATIVE 08/07/2023 2052   KETONESUR NEGATIVE 08/07/2023 2052   PROTEINUR 30 (A) 08/07/2023 2052   NITRITE NEGATIVE 08/07/2023 2052   LEUKOCYTESUR NEGATIVE 08/07/2023 2052   Sepsis Labs Recent Labs  Lab 08/17/23 0321 08/19/23 1229 08/22/23 1022 08/23/23 0607  WBC 8.2 9.7 9.3 9.5   Microbiology Recent Results (from the past 240 hours)  Surgical pcr screen      Status: None   Collection Time: 08/13/23  6:18 PM   Specimen: Nasal Mucosa; Nasal Swab  Result Value Ref Range Status   MRSA, PCR NEGATIVE NEGATIVE Final   Staphylococcus aureus NEGATIVE NEGATIVE Final    Comment: (NOTE) The Xpert SA Assay (FDA approved for NASAL specimens in patients 81 years of age and older), is one component of a comprehensive surveillance program. It is not intended to diagnose infection nor to guide or monitor treatment. Performed at [facility], [address] [place], [place] [address]     Procedures/Studies: ECHOCARDIOGRAM COMPLETE Result Date: 08/11/2023    ECHOCARDIOGRAM REPORT   Patient Name:   Earl Calderon Date of Exam: 08/11/2023 Medical Rec #:  [id number]         Height:       69.5 in Accession #:    [id number]        Weight:       143.0 lb Date of Birth:  07/12/1951         BSA:          1.800 m Patient Age:    72 years          BP:           141/55 mmHg Patient Gender: M                 HR:           53 bpm. Exam Location:  Inpatient Procedure: 2D Echo, 3D Echo, Color Doppler, Cardiac Doppler and Strain Analysis Indications:    I27.20 Pulmonary Hypertension  History:        Patient has prior history of Echocardiogram examinations, most  recent 01/29/2023. CAD, Arrythmias:LBBB; Risk                 Factors:Hypertension, Diabetes and Dyslipidemia.  Sonographer:    Irving Burton Senior RDCS Referring Phys: [id number] [facility] GOEL IMPRESSIONS  1. Mildly decreased global longitudinal strain with some apical sparing, left ventricular mass calculated at 152.8g/m2. Left ventricular ejection fraction, by estimation, is 55 to 60%. Left ventricular ejection fraction by 3D volume is 55 %. The left ventricle has normal function. The left ventricle has no regional wall motion abnormalities. There is moderate concentric left ventricular hypertrophy. Left ventricular diastolic parameters are consistent with Grade II diastolic dysfunction (pseudonormalization). The  average left ventricular global longitudinal strain is -13.9 %. The global longitudinal strain is abnormal.  2. Right ventricular systolic function is normal. The right ventricular size is normal. Moderately increased right ventricular wall thickness. There is normal pulmonary artery systolic pressure. The estimated right ventricular systolic pressure is 29.0 mmHg.  3. Left atrial size was severely dilated.  4. A small pericardial effusion is present. The pericardial effusion is circumferential.  5. The mitral valve is normal in structure. Trivial mitral valve regurgitation.  6. The aortic valve is tricuspid. There is mild thickening of the aortic valve. Aortic valve regurgitation is not visualized. Aortic valve sclerosis is present, with no evidence of aortic valve stenosis.  7. The inferior vena cava is normal in size with greater than 50% respiratory variability, suggesting right atrial pressure of 3 mmHg. Comparison(s): The left ventricular function has improved. FINDINGS  Left Ventricle: Mildly decreased global longitudinal strain with some apical sparing, left ventricular mass calculated at 152.8g/m2. Left ventricular ejection fraction, by estimation, is 55 to 60%. Left ventricular ejection fraction by 3D volume is 55 %. The left ventricle has normal function. The left ventricle has no regional wall motion abnormalities. The average left ventricular global longitudinal strain is -13.9 %. The global longitudinal strain is abnormal. The left ventricular internal cavity size was normal in size. There is moderate concentric left ventricular hypertrophy. Abnormal (paradoxical) septal motion, consistent with left bundle branch block. Left ventricular diastolic parameters are consistent with Grade II diastolic dysfunction (pseudonormalization). Indeterminate filling pressures. Right Ventricle: The right ventricular size is normal. Moderately increased right ventricular wall thickness. Right ventricular systolic  function is normal. There is normal pulmonary artery systolic pressure. The tricuspid regurgitant velocity is 2.55 m/s, and with an assumed right atrial pressure of 3 mmHg, the estimated right ventricular systolic pressure is 29.0 mmHg. Left Atrium: Left atrial size was severely dilated. Right Atrium: Right atrial size was normal in size. Pericardium: A small pericardial effusion is present. The pericardial effusion is circumferential. Mitral Valve: The mitral valve is normal in structure. Mild mitral annular calcification. Trivial mitral valve regurgitation. Tricuspid Valve: The tricuspid valve is normal in structure. Tricuspid valve regurgitation is mild. Aortic Valve: The aortic valve is tricuspid. There is mild thickening of the aortic valve. Aortic valve regurgitation is not visualized. Aortic valve sclerosis is present, with no evidence of aortic valve stenosis. Pulmonic Valve: The pulmonic valve was normal in structure. Pulmonic valve regurgitation is trivial. Aorta: The aortic root and ascending aorta are structurally normal, with no evidence of dilitation. Venous: The inferior vena cava is normal in size with greater than 50% respiratory variability, suggesting right atrial pressure of 3 mmHg. IAS/Shunts: No atrial level shunt detected by color flow Doppler.  LEFT VENTRICLE PLAX 2D LVIDd:         3.70 cm  Diastology LVIDs:         2.50 cm         LV e' medial:    4.46 cm/s LV PW:         1.45 cm         LV E/e' medial:  11.7 LV IVS:        1.60 cm         LV e' lateral:   6.64 cm/s LVOT diam:     2.00 cm         LV E/e' lateral: 7.8 LV SV:         78 LV SV Index:   43              2D LVOT Area:     3.14 cm        Longitudinal                                Strain                                2D Strain GLS  -14.2 %                                (A2C):                                2D Strain GLS  -15.0 %                                (A3C):                                2D Strain GLS  -12.4 %                                 (A4C):                                2D Strain GLS  -13.9 %                                Avg:                                 3D Volume EF                                LV 3D EF:    Left                                             ventricul  ar                                             ejection                                             fraction                                             by 3D                                             volume is                                             55 %.                                 3D Volume EF:                                3D EF:        55 %                                LV EDV:       162 ml                                LV ESV:       74 ml                                LV SV:        88 ml RIGHT VENTRICLE RV S prime:     11.40 cm/s TAPSE (M-mode): 1.8 cm LEFT ATRIUM             Index        RIGHT ATRIUM           Index LA diam:        4.30 cm 2.39 cm/m   RA Area:     18.20 cm LA Vol (A2C):   78.6 ml 43.66 ml/m  RA Volume:   51.70 ml  28.72 ml/m LA Vol (A4C):   83.4 ml 46.32 ml/m LA Biplane Vol: 81.3 ml 45.16 ml/m  AORTIC VALVE LVOT Vmax:   122.00 cm/s LVOT Vmean:  94.400 cm/s LVOT VTI:    0.247 m  AORTA Ao Root diam: 3.30 cm Ao Asc diam:  3.50 cm MITRAL VALVE               TRICUSPID VALVE MV Area (PHT): 1.56 cm    TR Peak  grad:   26.0 mmHg MV Decel Time: 485 msec    TR Vmax:        255.00 cm/s MV E velocity: 52.10 cm/s MV A velocity: 75.10 cm/s  SHUNTS MV E/A ratio:  0.69        Systemic VTI:  0.25 m                            Systemic Diam: 2.00 cm Thurmon Fair MD Electronically signed by Thurmon Fair MD Signature Date/Time: 08/11/2023/10:55:26 AM    Final    US RENAL Result Date: 08/10/2023 CLINICAL DATA:  Acute renal insufficiency. EXAM: RENAL / URINARY TRACT ULTRASOUND COMPLETE COMPARISON:  Abdominal ultrasound dated 11/12/2018. FINDINGS: Right Kidney: Renal  measurements: 11.3 x 6.8 x 5.4 cm = volume: 218 mL. Increased parenchymal echogenicity. No hydronephrosis or shadowing stone. Left Kidney: Renal measurements: 11.5 x 5.4 x 5.3 cm = volume: 172 mL. Increased parenchymal echogenicity. No hydronephrosis or shadowing stone. Bladder: The urinary bladder is collapsed and not well visualized. Other: None. IMPRESSION: Increased renal parenchymal echogenicity may represent medical renal disease. No hydronephrosis or shadowing stone. Electronically Signed   By: Elgie Collard M.D.   On: 08/10/2023 22:17   PERIPHERAL VASCULAR CATHETERIZATION Result Date: 08/10/2023 Images from the original result were not included.   Patient name: Earl Calderon   MRN: [id number]        DOB: 17-Oct-1950          Sex: male  08/10/2023 Pre-operative Diagnosis: CL TI with bilateral foot wounds Post-operative diagnosis:  Same Surgeon:  Daria Pastures, MD Procedure Performed:  Ultrasound-guided access of left common femoral artery Second order cannulation of right external iliac Aortogram Bilateral lower extremity angiogram Mynx closure of left common femoral artery access 5 minutes moderate sedation   Indications: Mr. Mcguiness is a 72 year old male with multiple significant medical comorbidities who presented to the hospital with shortness of breath was found to have bilateral foot wounds.  Noninvasive vascular labs were notable for normal ABIs bilaterally but significantly decreased toe pressures in the 60s.  Recent benefits of bilateral lower extremity angiogram with possible intervention were reviewed with the patient and the wife, they expressed understanding were willing to proceed.  Given his renal function CO2 angiography was planned.  Findings: Widely patent infrarenal aorta and bilateral iliac systems.  Right common femoral, profunda and SFA patent without significant stenosis.  Popliteal artery patent without significant stenosis.  The AT is patent proximally but occludes at its mid  segment and is reconstituted distally at the ankle by peroneal collaterals.  The PT and peroneal are patent without significant stenosis throughout the course.  Left common femoral, profunda and SFA are patent without significant stenosis popliteal artery patent without significant stenosis.  There is three-vessel runoff without significant stenosis noted in the AT, PT or peroneal arteries.             Procedure:  The patient was identified in the holding area and taken to the cath lab  The patient was then placed supine on the table and prepped and draped in the usual sterile fashion.  A time out was called.  Ultrasound was used to evaluate the left common femoral artery.  It was patent .  A digital ultrasound image was acquired.  A micropuncture needle was used to access the left common femoral artery under ultrasound guidance.  An 018 wire was advanced without resistance and a micropuncture sheath  was placed.  The 018 wire was removed and a benson wire was placed.  The micropuncture sheath was exchanged for a 5 french sheath.  An omniflush catheter was advanced over the wire to the level of L-1.  During this time patient began to desat to have respiratory distress.  The sedation was reversed with flumazenil, jaw thrust performed and a nonrebreather mask was placed.  During this time he did not have significant bradycardia down to 30 but recovered after a few minutes with a sat of 100 and a heart rate in the 70s which was his baseline.  At this time he was responding appropriately and denied any chest pain or shortness of breath.  I decided to proceed with angiography. An abdominal angiogram was obtained.  Next, using the omniflush catheter and a Bentson wire, the aortic bifurcation was crossed and the catheter was placed into theright external iliac artery and right runoff was obtained. This demonstrated the above findings. left runoff was performed via retrograde sheath injections which demonstrated the above  findings.  Contrast: None, CO2 used Sedation: 5 minutes  Impression: Inline flow to the foot without flow-limiting stenosis bilaterally. Decreased toe pressures likely due to microvascular disease.  Remains high risk for amputation.   Daria Pastures MD [facility] Office: [phone]   CT HEAD WO CONTRAST ( ) Result Date: 08/08/2023 CLINICAL DATA:  Mental status change, unknown cause EXAM: CT HEAD WITHOUT CONTRAST TECHNIQUE: Contiguous axial images were obtained from the base of the skull through the vertex without intravenous contrast. RADIATION DOSE REDUCTION: This exam was performed according to the departmental dose-optimization program which includes automated exposure control, adjustment of the mA and/or kV according to patient size and/or use of iterative reconstruction technique. COMPARISON:  CT Head 04/22/22 FINDINGS: Brain: No evidence of acute infarction, hemorrhage, hydrocephalus, extra-axial collection or mass lesion/mass effect.Background of mild chronic microvascular ischemic change. 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 Vascular: No hyperdense vessel or unexpected calcification. Unchanged calcification along the left A2 segment. Skull: Normal. Negative for fracture or focal lesion. Sinuses/Orbits: No acute finding.  Left lens replacement. Other: None. IMPRESSION: No acute intracranial abnormality. Electronically Signed   By: Lorenza Cambridge M.D.   On: 08/08/2023 17:33   MR FOOT LEFT WO CONTRAST Result Date: 08/08/2023 CLINICAL DATA:  Diabetic with chronic bilateral foot wounds. Left plantar hindfoot wound. Soft tissue infection suspected. Evaluate for osteomyelitis. EXAM: MRI OF THE LEFT FOOT WITHOUT CONTRAST TECHNIQUE: Multiplanar, multisequence MR imaging of the left hindfoot was performed. No intravenous contrast was administered. COMPARISON:  Radiographs 08/07/2023 and 04/21/2022. FINDINGS: Bones/Joint/Cartilage As demonstrated radiographically, there  is soft tissue ulceration along the plantar aspect of the hindfoot with underlying heterogeneous edema and soft tissue emphysema throughout the subcutaneous fat. These soft tissue changes abut the inferolateral aspect of the calcaneal tuberosity and are associated with mild calcaneal marrow T2 hyperintensity. No definite cortical destruction on the T1 weighted images. No other suspicious or acute osseous findings identified within the hindfoot or midfoot. There are chronic subtalar and talonavicular degenerative changes and a mild pes planus deformity. No significant tibiotalar arthropathy or effusion. Ligaments The major ankle ligaments appear intact. Muscles and Tendons The ankle tendons appear intact without significant tenosynovitis. No focal muscular abnormalities are identified. Mild generalized muscular T2 hyperintensity, likely related to underlying diabetes. Soft tissues As above, soft tissue ulceration along the plantar aspect of the hindfoot with underlying subcutaneous edema and soft tissue emphysema as correlated with the earlier  radiographs. No focal fluid collection identified. There is mild nonspecific dorsal subcutaneous edema. IMPRESSION: 1. Soft tissue ulceration along the plantar aspect of the hindfoot with underlying subcutaneous edema and soft tissue emphysema, consistent with soft tissue infection. The soft tissue gas suggest possible necrotizing infection. Correlate clinically. 2. Adjacent mild calcaneal marrow T2 hyperintensity in the calcaneal tuberosity without cortical destruction. This finding is nonspecific and could be secondary to hyperemia or early osteomyelitis. 3. No focal fluid collection identified. 4. Chronic subtalar and talonavicular degenerative changes. Electronically Signed   By: Carey Bullocks M.D.   On: 08/08/2023 08:08   MR FOOT RIGHT WO CONTRAST Result Date: 08/08/2023 CLINICAL DATA:  Chronic bilateral foot wounds. Clinical concern for osteomyelitis. Soft tissue  ulcer along the medial aspect of the 1st MTP joint. Soft tissue infection suspected. EXAM: MRI OF THE RIGHT FOREFOOT WITHOUT CONTRAST TECHNIQUE: Multiplanar, multisequence MR imaging of the right forefoot was performed. No intravenous contrast was administered. COMPARISON:  Radiographs same date.  No other comparison studies. FINDINGS: Bones/Joint/Cartilage Mild soft tissue ulceration along the medial aspect of the 1st metatarsophalangeal joint. Underlying mild 1st metatarsal-phalangeal degenerative changes with joint space narrowing and osteophytes. No significant joint effusion or evidence of the underlying osteomyelitis. There is nonspecific marrow edema within the 2nd metatarsal head with questionable underlying linear subchondral low signal, best seen on sagittal image 16/9. This could reflect Freiberg infraction. Mild proximal extension of marrow edema into the 2nd metatarsal shaft. No significant joint effusion or abnormality of the adjacent 2nd proximal phalangeal base. The additional toes and metatarsals are normal. Normal alignment at the Lisfranc joint Ligaments Intact Lisfranc ligament. The collateral ligaments of the metatarsophalangeal joints appear intact. Muscles and Tendons The forefoot tendons appear intact. Mild edema and a small amount of ill-defined fluid along the flexor digitorum tendons within the forefoot. Mild nonspecific muscular atrophy and edema without focal fluid collection. Soft tissues As above, mild soft tissue ulceration along the medial aspect of the 1st metatarsophalangeal joint. No underlying focal fluid collection. Nonspecific dorsal subcutaneous edema. In correlation with radiographs, underlying vascular calcifications typical of diabetes. IMPRESSION: 1. Mild soft tissue ulceration along the medial aspect of the 1st metatarsophalangeal joint without evidence of underlying osteomyelitis or abscess. 2. Nonspecific marrow edema within the 2nd metatarsal head with questionable  underlying linear subchondral low signal. This could reflect Freiberg infraction or a stress fracture. 3. Mild degenerative changes of the 1st metatarsophalangeal joint. 4. Mild nonspecific edema and a small amount of ill-defined fluid along the flexor digitorum tendons and mild dorsal subcutaneous edema. No focal fluid collections. Electronically Signed   By: Carey Bullocks M.D.   On: 08/08/2023 07:58   DG Foot 2 Views Right Result Date: 08/07/2023 CLINICAL DATA:  Chronic foot wounds, concern for osteo EXAM: RIGHT FOOT - 2 VIEW COMPARISON:  None Available. FINDINGS: Small soft tissue ulcer along the medial aspect of the 1st MTP joint. No associated adjacent cortical destruction to suggest osteomyelitis. Mild soft tissue swelling/edema, most prominent along the dorsal forefoot. No fracture or dislocation is seen. Vascular calcifications. IMPRESSION: Small soft tissue ulcer along the medial aspect of the 1st MTP joint. No radiographic findings of osteomyelitis. Electronically Signed   By: Charline Bills M.D.   On: 08/07/2023 18:27   DG Foot 2 Views Left Result Date: 08/07/2023 CLINICAL DATA:  Chronic foot wounds, concern for osteo EXAM: LEFT FOOT - 2 VIEW COMPARISON:  None Available. FINDINGS: Soft tissue wound/ulcer along the plantar surface of the heel, adjacent to  the calcaneus. Flattening of the talus with pes planus. Subtle cortical lucency medially along the anterior talus versus anterior process of the calcaneus on the frontal radiograph. While this could reflect osteomyelitis, it is not well visualized on the lateral view. Superimposed soft tissue gas related to the large ulcer could also lead to this appearance. No fracture is seen. Mild to moderate dorsal soft tissue swelling. IMPRESSION: Soft tissue wound/ulcer along the plantar surface of the heel, adjacent to the calcaneus. Possible subtle cortical lucency along the anterior calcaneus versus talus, equivocal. Osteomyelitis is not excluded.  Electronically Signed   By: Charline Bills M.D.   On: 08/07/2023 18:24   VAS [place] ABI WITH/WO TBI Result Date: 08/07/2023  LOWER EXTREMITY DOPPLER STUDY Patient Name:  Earl Calderon  Date of Exam:   08/07/2023 Medical Rec #: [id number]          Accession #:    [id number] Date of Birth: March 16, 1951          Patient Gender: M Patient Age:   66 years Exam Location[facility]  [facility] Procedure:      VAS [place] ABI WITH/WO TBI Referring Phys: Evlyn Kanner --------------------------------------------------------------------------------  Indications: BLE DM foot infection/wounds High Risk Factors: Hypertension, hyperlipidemia, Diabetes, no history of                    smoking, coronary artery disease. Other Factors: CHF,.  Limitations: Today's exam was limited due to venous interference and hyperemic              flow. Comparison Study: No previous exams Performing Technologist: Ernestene Mention RVT/RDMS  Examination Guidelines: A complete evaluation includes at minimum, Doppler waveform signals and systolic blood pressure reading at the level of bilateral brachial, anterior tibial, and posterior tibial arteries, when vessel segments are accessible. Bilateral testing is considered an integral part of a complete examination. Photoelectric Plethysmograph (PPG) waveforms and toe systolic pressure readings are included as required and additional duplex testing as needed. Limited examinations for reoccurring indications may be performed as noted.  ABI Findings: +---------+------------------+-----+-------------------+--------+ Right    Rt Pressure (mmHg)IndexWaveform           Comment  +---------+------------------+-----+-------------------+--------+ Brachial 128                    biphasic                    +---------+------------------+-----+-------------------+--------+ PTA      137               1.07 audibly multiphasic         +---------+------------------+-----+-------------------+--------+ DP       125                0.98 audibly multiphasic         +---------+------------------+-----+-------------------+--------+ Great Toe67                0.52 Abnormal                    +---------+------------------+-----+-------------------+--------+ +---------+------------------+-----+-------------------+-------+ Left     Lt Pressure (mmHg)IndexWaveform           Comment +---------+------------------+-----+-------------------+-------+ Brachial 116                    triphasic                  +---------+------------------+-----+-------------------+-------+ PTA      135  1.05 audibly multiphasic        +---------+------------------+-----+-------------------+-------+ DP       126               0.98 audibly multiphasic        +---------+------------------+-----+-------------------+-------+ Great Toe51                0.40 Abnormal                   +---------+------------------+-----+-------------------+-------+ +-------+-----------+-----------+------------+------------+ ABI/TBIToday's ABIToday's TBIPrevious ABIPrevious TBI +-------+-----------+-----------+------------+------------+ Right  1.07       0.52                                +-------+-----------+-----------+------------+------------+ Left   1.05       0.40                                +-------+-----------+-----------+------------+------------+ Accurate doppler signals difficult to obtain due to venous interference and hyperemic flow from infection.  Summary: Right: Resting right ankle-brachial index is within normal range. The right toe-brachial index is abnormal. Left: Resting left ankle-brachial index is within normal range. The left toe-brachial index is abnormal. *See table(s) above for measurements and observations.  Electronically signed by Carolynn Sayers on 08/07/2023 at 4:54:52 PM.    Final    CT Angio Chest PE W and/or Wo Contrast Result Date: 08/07/2023 CLINICAL DATA:  Chest pain. EXAM:  CT ANGIOGRAPHY CHEST WITH CONTRAST TECHNIQUE: Multidetector CT imaging of the chest was performed using the standard protocol during bolus administration of intravenous contrast. Multiplanar CT image reconstructions and MIPs were obtained to evaluate the vascular anatomy. RADIATION DOSE REDUCTION: This exam was performed according to the departmental dose-optimization program which includes automated exposure control, adjustment of the mA and/or kV according to patient size and/or use of iterative reconstruction technique. CONTRAST:  60mL OMNIPAQUE IOHEXOL 350 MG/ML SOLN COMPARISON:  July 19, 2021. FINDINGS: Cardiovascular: Satisfactory opacification of the pulmonary arteries to the segmental level. No evidence of pulmonary embolism. Normal heart size. No pericardial effusion. Coronary artery calcifications are noted. Mediastinum/Nodes: No enlarged mediastinal, hilar, or axillary lymph nodes. Thyroid gland, trachea, and esophagus demonstrate no significant findings. Lungs/Pleura: Lungs are clear. No pleural effusion or pneumothorax. Upper Abdomen: No acute abnormality. Musculoskeletal: No chest wall abnormality. No acute or significant osseous findings. Review of the MIP images confirms the above findings. IMPRESSION: No definite evidence of pulmonary embolus. Coronary artery calcifications are noted suggesting coronary artery disease. Aortic Atherosclerosis (ICD10-I70.0). Electronically Signed   By: Lupita Raider M.D.   On: 08/07/2023 15:49   DG Chest Port 1 View Result Date: 08/07/2023 CLINICAL DATA:  Chest pain. EXAM: PORTABLE CHEST 1 VIEW COMPARISON:  07/09/2020. FINDINGS: Bilateral lung fields are clear. Note is made of elevated right hemidiaphragm. Bilateral costophrenic angles are clear. Normal cardio-mediastinal silhouette. No acute osseous abnormalities. The soft tissues are within normal limits. IMPRESSION: No active disease. Electronically Signed   By: Jules Schick M.D.   On: 08/07/2023 12:21     Time coordinating discharge: 45 mins  SIGNED:  Carollee Herter, DO [facility] 08/23/23, 4:00 PM

## 2023-08-23 NOTE — Progress Notes (Signed)
PROGRESS NOTE    Earl Calderon  WUJ:[id number] DOB: September 22, 1950 DOA: 08/07/2023 PCP: Melvenia Beam, MD  Subjective: Pt seen and examined. Pt is calm today. Pt refused PRBC transfusion. After further discussion with him, he is now agreeable to receiving PRBC transfusion for his HgB of 6.7 g/dl. Wife is not at bedside. CM working on SNF placement in [place].   Hospital Course: HPI: Earl Calderon is a 72 y.o. male with medical history significant of CKD, DM, MGUS and documented CHF in chart.  EF was 49% in May, 2024. LVH.    On review of chart, I do not notice any pulmonary pathology being documented.  Patient reports that his baseline functional status is 1 of getting "tired "after doing yard work in the house.  Otherwise he is able to get up and walk around the house without any trouble breathing.  However for the last 2 weeks patient describes a progressive sensation of shortness of breath.  Initially present with exertion and now patient states he is feeling short of breath even at rest.  This prompted the patient to come to the hospital today.  Patient further reports generalized anterior chest discomfort with efforts of breathing.  Patient does not report any fever or any pleuritic chest pain or any cough or leg swelling.   Patient's reason for coming to the hospital are as above per patient.   Further patient reports that he has had chronic leg "sores" for several weeks time, they have been worsening on both lower extremities for the last couple of weeks.  And they are more painful.   Further report is obtained from the ER provider.  Asher Muir Barrett reports that the patient was found to be hypoxic on attempts at ambulation.  However he is not hypoxic at rest.  Oxygen has been given as needed.  Troponins have been mildly elevated, medical evaluation is sought. Review of Systems: Unable to review all systems due to lack of cooperation from patient.  Patient wanted to not  answer more for questions or engage in prolonged encounter.  Therefore review of system was not possible similarly exam was limited.  Antibiotic Therapy: Anti-infectives (From admission, onward)    Start     Dose/Rate Route Frequency Ordered Stop   08/14/23 1015  vancomycin (VANCOCIN) powder  Status:  Discontinued          As needed 08/14/23 1015 08/14/23 1035   08/14/23 0815  ceFAZolin (ANCEF) IVPB 2g/100 mL premix        2 g 200 mL/hr over 30 Minutes Intravenous On call to O.R. 08/14/23 0729 08/14/23 1012   08/11/23 0800  cefTRIAXone (ROCEPHIN) 1 g in sodium chloride 0.9 % 100 mL IVPB        1 g 200 mL/hr over 30 Minutes Intravenous Daily 08/11/23 0559 08/15/23 2359   08/10/23 2200  vancomycin (VANCOCIN) IVPB 1000 mg/200 mL premix  Status:  Discontinued        1,000 mg 200 mL/hr over 60 Minutes Intravenous Every 48 hours 08/09/23 1047 08/10/23 1206   08/10/23 1206  vancomycin variable dose per unstable renal function (pharmacist dosing)  Status:  Discontinued         Does not apply See admin instructions 08/10/23 1206 08/10/23 1218   08/10/23 0600  cefUROXime (ZINACEF) 1.5 g in sodium chloride 0.9 % 100 mL IVPB        1.5 g 200 mL/hr over 30 Minutes Intravenous On call to O.R. 08/09/23 9562 08/11/23 0559  08/09/23 1400  piperacillin-tazobactam (ZOSYN) IVPB 2.25 g  Status:  Discontinued        2.25 g 100 mL/hr over 30 Minutes Intravenous Every 8 hours 08/09/23 1050 08/11/23 0557   08/08/23 2200  vancomycin (VANCOREADY) IVPB 750 mg/150 mL  Status:  Discontinued        750 mg 150 mL/hr over 60 Minutes Intravenous Every 24 hours 08/08/23 0015 08/09/23 1047   08/07/23 2330  Vancomycin (VANCOCIN) 1,500 mg in sodium chloride 0.9 % 500 mL IVPB        1,500 mg 250 mL/hr over 120 Minutes Intravenous  Once 08/07/23 2240 08/08/23 0740   08/07/23 2300  piperacillin-tazobactam (ZOSYN) IVPB 3.375 g  Status:  Discontinued        3.375 g 12.5 mL/hr over 240 Minutes Intravenous Every 8 hours  08/07/23 2240 08/09/23 1050   08/07/23 1715  cefTRIAXone (ROCEPHIN) 1 g in sodium chloride 0.9 % 100 mL IVPB        1 g 200 mL/hr over 30 Minutes Intravenous  Once 08/07/23 1707 08/07/23 1848       Procedures: 08-10-2023 Aortogram with LE angiogram 08-14-2023 Bilateral BKA  Consultants: Ortho nephrology    Assessment and Plan: * Acute respiratory failure with hypoxia (HCC) On admission, Patient had presented initially for shortness of breath and hypoxia. -Unclear etiology, CTA chest showed no evidence of pulmonary embolus, aortic atherosclerosis, coronary artery calcifications.  No pleural effusion or pneumothorax. -2D echo still pending -O2 sats 100% on 2 L  08-17-2023 pt the time I saw the patient today, his 2D echo showed EF of 55 to 60%, no regional WMA, G2 DD Stable, O2 sats 99% on room air  08-18-2023 on RA. Resolved.  Acute postoperative anemia due to expected blood loss 08-17-2023 had 1 unit PRBC transfused on 08-16-2023.  08-19-2023 order CBC today.  08-21-2023 HgB 7.6. this is due to blood loss from daily labs and his bilateral BKA. He will need oral iron therapy at home for a few months. His family doctor can arrange for f/u HgB and IV iron if needed.   08-22-2023 HgB came back at 6.7 g/dl. 2 units PRBC ordered for transfusion. Pt refusing PRBC transfusion. Pt's bedside RN and charge RN will also document pt's refusal of PRBC transfusion  08-23-2023 pt refused PRBC yesterday. Pt is agreeable to receiving PRBC transfusion today.  Acute renal failure superimposed on stage 3a chronic kidney disease (HCC) On admission through 12-15, 2024, Patient baseline creatinine between 1.06 and 1.31.  Current creatinine 1.78.  Without associated elevated BUN.  I suspect this may be related to infection and hyperglycemia.  Patient received 500 cc normal saline fluid bolus in the ER.  Check urinalysis sodium creatinine.  Trend creatinine response to 75 cc/h fluid hydration  overnight  08-17-2023 Scr 1.49. at baseline.  08-19-2023 will repeat CMP today. As pt not taking in a lot of po fluids. 08-20-2023 Scr stable at 1.52 08-21-2023 Scr 1.38 today. I think restarting aldactone and Entresto back when his AKI is recovering would only get him back into trouble. He is not taking much in the way of PO liquids. May be due to him being in the hospital. I think the right management is to get him home first and see what he does with eating/drinking. Then have him see cardiology in clinic to restart GDMT. He will need close monitoring of his renal function if Entresto and/or aldactone are restarted.  08-22-2023 get labs today.  HR has improved  with HR in 70s. BP still elevated. Could add coreg today.  08-23-2023 pt did receive dose of lasix that was meant for PRBC transfusion(that he did not receive yesterday). Scr up to 1.39 as expected. Pt is agreeable to PRBC transfusion today. Hopefully his renal perfusion will improve with PRBC transfusion and Scr will continue to trend down.  Essential hypertension 08-18-2023 restart scheduled hydralazine 25 mg tid.  08-19-2023 BP stable on hydralazine 25 mg tid.  08-21-2023 BP elevated but may be due to pain. Add prn labetalol. Monitor BP. Start isordil 20 mg bid to get his back on some CHF meds.  08-22-2023 will get labs today and decided about taking coreg 3.125 mg bid.  08-23-2023 BP stable. On coreg 3.125 mg bid, isordil 20 mg bid, hydralazine 25 mg tid.  Chronic combined systolic and diastolic heart failure (HCC) Chronically documented. [place] does not appear to be fluids overloaded at this time. Med rec pending pharmacy input. Trop of 127 at presentation now down to 39 - over all presentation not consistnt with ACS. EKG showing stable LVH changes since 12/29/2022. See complaint of SOB/hypoxia as well.  08-21-2023 pt's AKI has resolved. Pt's Scr is back to his baseline of 1.2-1.3. pt on hydralazine. His Entresto, aldactone  and farxiga were stopped when he was admitted for AKI.  HR at 60 bpm without any betablockers since admission. Cardiology notes reviewed. Pt had been complaining about feeling fatigued despite reduction in beta blocker dosage. I'm concerned about restarting beta blockers at this time due to already low resting HR.  Given his immobility for his bilateral BKA I doubt his resting HR will get above 60 bpm.  Pt already on hydralazine for HTN(25 mg tid). Pt is not taking much in the way of po liquids. I think adding spironolactone and Entresto back into his regimen may only worsen his now recovered AKI on CKD stage 3a. Will start isosorbide dinitrate 20 mg bid. I think he will need close f/u with cards clinic to get him back onto the right CHF meds. Luckily his echo on 08-11-2023 showed improving LVEF of 55-60%. LVEF was 45-50% in May 2024. I think adding his farxiga back is reasonable.  08-22-2023 will repeat CBC and CMP today.  As of today, CHF meds include hydralazine 25 mg tid and isordil 20 mg bid. Resting HR is in the 70s today. Will see what his labs look like today and decide if low dose coreg can be started as well as farxiga. He is not taking in enough fluids right now to need scheduled diuretics.  08-23-2023 pt refused PRBC transfusion yesterday. He is agreeable today. He did receive dose of lasix yesterday that was meant to be given in-between PRBC transfusion..that he ultimately did not receive yesterday.  With pt agreeing to PRBC transfusion today, hopefully his HgB will be greater than 8 g/dl. BP has improved with the changes. He remains on hydralazine 25 mg tid, isordil 20 mg bid. Coreg 3.125 mg bid added yesterday. HR in the mid 50s bpm today. Acceptable Heart rates. I still don't think he is ready to restart Entresto. Will see what his renal function is like tomorrow and make decision on adding back [place].  Delirium 08-18-2023 probably some hospital acquired delirium. Start scheduled seroquel 50  mg at 8 pm to head off any sun downing.  08-19-2023 received 50 mg seroquel at 8:12 pm last night. No reported overnight agitation. Continue with seroquel.  08-20-2023 continue with 50 mg seroquel at 8  pm. Will add prn IM geodon. Add am dose of seroquel 25 mg.  08-21-2023 seems to have resolved. Continue with seroquel 50 mg q 8 pm. Monitor how his AM dose of seroquel works.  08-23-2023 pt's delirium improving on scheduled AM(25 mg) and PM(50 mg) Seroquel. Continue.  S/P bilateral BKA (below knee amputation) (HCC) - on 08/14/2023 08-17-2023 continues with wound vac. Will need SNF at discharge.  08-19-2023 needs SNF at discharge.  08-20-2023 spoke to pt's wife Lamar Laundry today after 4 days of attempting to contact her.  Have never seen the wife at the hospital. Wife is adamant that pt is not going to SNF at discharge. Wife states that pt is going home at discharge and she will provide care. She does not want home PT/OT. Only wants referral to outpatient PT.  08-21-2023 add po dilaudid for pain q2h prn. CM making preparation for DC tomorrow to pt's home as pt's wife refused for pt to go to SNF at discharge.  08-22-2023 wife has been a barrier to discharge. She doesn't answer phone calls. In fact, it took this Clinical research associate 4 days to get in touch with her despite calling her every day. Wife was initially ADAMANTLY against sending pt to SNF. She demanded that pt be sent home with her. All arrangements were made for patient to go home today with various DME. Wife had refused for pt to get Home PT/OT but instead wanted him to get outpatient PT at "[facility]".  At the last moment on the close of business day on Friday, Aug 21, 2023, the wife suddenly refused to take patient home and wanted him to go to SNF.  08-23-2023 plan for DC to SNF in [place], [place]. CM involved.  Hypoxia On admission through 12-15, 2024, This is actually the patient's principal reason for presentation.  Patient  reports last couple of weeks of exertional shortness of breath that is progressive and now short of breath even at rest.  CT PE study is actually negative and I do not find marked fluid overload on the limited exam that I could do for the patient before patient interrupted the encounter.  Per report patient was actually hypoxic on ambulation but not at rest.  Given the current state of workup, residual concerns include possible pulmonary artery hypertension, pleural effusion or other limitation of cardiac output.  Especially given the troponin elevation I will go ahead and proceed with an echo at this time and see where that leads [place].  Continue with as needed oxygen.  08-17-2023 resolved.  Gangrene of both feet (HCC) On admission through 12-15, 2024, , Of b/l lower extermity - foot and ankle. Patient has foul-smelling slightly wet appearing black margin ulcers, see picture in media section of the chart.  Concerning for wet gangrene in this patient with uncontrolled diabetes mellitus and AKI. No  sepsis. Slight lactic acidosis POA, resolved. Patient has had ankle-brachial index evaluation done, see separate study in the CV procedure section.  Patient has abnormal right and left toe to brachial index.  At this time time MRI is pending to rule out deep osteomyelitis versus occult abscess in the feet.  Patient is s/p Rocephin.  I will draw blood cultures and start the patient on vancomycin and Zosyn.  Patient will need surgical input given the gangrene in his feet. I discussed case with Marisue Humble PA of vascular surgeon. Dr. Hetty Blend is in the OR (he was indirectly made aware of the consult).  08-17-2023 pt is  s/p bilateral BKA(trans-tibial). Will need SNF. No need for abx. He has surgical cure of his gangrene.  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  He has an IgG kappa spike. To me,  I still think that his diabetes can be a much bigger problem than myeloma will be. Hopefully, he will be able to get the blood sugar under control. We will have to follow him up.  We will have to get him back in about 2 or 3 months for follow-up. Hopefully, his blood sugars would not cause problems for him that we will end him up in the hospital."  08-17-2023 stable.  Type 2 diabetes mellitus with chronic kidney disease, with long-term current use of insulin (HCC) On admission, Associated with hyperglycemia, without evidence of DKA.  Glucose on presentation was 525.  Likely worsened by infection and possible nonadherence to regimen.  Patient received 10 units of subcutaneous insulin in the ER.  I will continue with patient's remotely documented Levemir 8 units twice daily as well as insulin sliding scale ACHS moderate.  We will trend this.  08-17-2023 on bid levemir and SSI. CBG in mid 200s. Will increase levemir to 10 units BID.   08-19-2023 CBG in acceptable ranges. Continue with levemir 10 units BID. Hold if CBG < 110  08-20-2023 CBG acceptable  08-21-2023 to 08-23-2023 CBG acceptable ranges   DVT prophylaxis: enoxaparin (LOVENOX) injection 40 mg Start: 08/19/23 1000    Code Status: Full Code Family Communication: no family at bedside. I last spoke to wife on 08-20-2023. Disposition Plan: SNF Reason for continuing need for hospitalization: medically stable for DC. PRBC transfusion today not emergent but would prevent pt needing to be admitted again in the future for transfusion. If pt had a bed at SNF today, he could be discharged prior to transfusion.  Objective: Vitals:   08/23/23 0604 08/23/23 0814 08/23/23 1008 08/23/23 1032  BP: (!) 183/84 (!) 155/64 (!) 152/72 134/67  Pulse: 75 (!) 56 (!) 57 (!) 56  Resp: 19 18 16 16   Temp: 98.1 F (36.7 C)  (!) 97.5 F (36.4 C) 97.8 F (36.6 C)  TempSrc: Oral  Oral Oral  SpO2: 95% 93% 96% 93%  Weight:      Height:        Intake/Output  Summary (Last 24 hours) at 08/23/2023 1119 Last data filed at 08/23/2023 0829 Gross per 24 hour  Intake 277.33 ml  Output 200 ml  Net 77.33 ml   Filed Weights   08/07/23 2223 08/14/23 0813  Weight: 64.9 kg 68 kg    Examination:  Physical Exam Vitals and nursing note reviewed.  HENT:     Head: Normocephalic and atraumatic.     Nose: Nose normal.  Cardiovascular:     Rate and Rhythm: Normal rate and regular rhythm.  Pulmonary:     Effort: Pulmonary effort is normal.     Breath sounds: Normal breath sounds.  Abdominal:     General: Abdomen is flat. Bowel sounds are normal.  Musculoskeletal:     Comments: Bilateral BKAs  Skin:    General: Skin is warm and dry.     Capillary Refill: Capillary refill takes less than 2 seconds.  Neurological:     Mental Status: He is alert. He is disoriented.     Comments: Pleasant and not agitated Knows he is in the hospital but does not know which  one. Not oriented to month. Knows year     Data Reviewed: I have personally reviewed following labs and imaging studies  CBC: Recent Labs  Lab 08/17/23 0321 08/19/23 1229 08/22/23 1022 08/23/23 0607  WBC 8.2 9.7 9.3 9.5  NEUTROABS  --  7.3 7.2 6.9  HGB 7.4* 7.6* 6.7* 6.8*  HCT 22.3* 24.3* 21.0* 21.9*  MCV 93.3 97.2 97.7 99.1  PLT 351 410* 320 286   Basic Metabolic Panel: Recent Labs  Lab 08/17/23 0321 08/19/23 1229 08/21/23 0820 08/22/23 1022 08/23/23 0607  NA 136 135  --  135 138  K 4.5 4.7  --  4.8 4.6  CL 105 103  --  101 102  CO2 24 25  --  28 28  GLUCOSE 145* 129*  --  174* 111*  BUN 32* 40*  --  42* 44*  CREATININE 1.49* 1.52* 1.38* 1.19 1.39*  CALCIUM 7.5* 8.0*  --  8.3* 9.0   GFR: Estimated Creatinine Clearance: 47.6 mL/min (A) (by C-G formula based on SCr of 1.39 mg/dL (H)). Liver Function Tests: Recent Labs  Lab 08/19/23 1229 08/22/23 1022 08/23/23 0607  AST 18 21 17   ALT 10 13 12   ALKPHOS 43 54 50  BILITOT 0.8 0.5 0.6  PROT 6.3* 6.0* 6.1*  ALBUMIN  1.9* 1.7* 1.9*   BNP (last 3 results) Recent Labs    08/07/23 1128 08/22/23 1022  BNP 200.5* 103.4*   CBG: Recent Labs  Lab 08/22/23 1209 08/22/23 1625 08/22/23 2117 08/23/23 0603 08/23/23 0900  GLUCAP 123* 80 121* 103* 90    Recent Results (from the past 240 hours)  Surgical pcr screen     Status: None   Collection Time: 08/13/23  6:18 PM   Specimen: Nasal Mucosa; Nasal Swab  Result Value Ref Range Status   MRSA, PCR NEGATIVE NEGATIVE Final   Staphylococcus aureus NEGATIVE NEGATIVE Final    Comment: (NOTE) The Xpert SA Assay (FDA approved for NASAL specimens in patients 35 years of age and older), is one component of a comprehensive surveillance program. It is not intended to diagnose infection nor to guide or monitor treatment. Performed at [facility], [address] [place], [place] [address]      Radiology Studies: No results found.  Scheduled Meds:  vitamin C  1,000 mg Oral Daily   aspirin EC  81 mg Oral Daily   carvedilol  3.125 mg Oral BID WC   enoxaparin (LOVENOX) injection  40 mg Subcutaneous Q24H   hydrALAZINE  25 mg Oral Q8H   insulin aspart  0-6 Units Subcutaneous TID WC   insulin detemir  10 Units Subcutaneous BID   iron polysaccharides  150 mg Oral Daily   isosorbide dinitrate  20 mg Oral BID   lidocaine  1 patch Transdermal Q24H   nutrition supplement (JUVEN)  1 packet Oral BID BM   QUEtiapine  25 mg Oral Q breakfast   QUEtiapine  50 mg Oral QPM   sodium chloride flush  3 mL Intravenous Q12H   zinc sulfate (50mg  elemental zinc)  220 mg Oral Daily   Continuous Infusions:   LOS: 16 days   Time spent: 40 minutes  Carollee Herter, DO  [facility]  08/23/2023, 11:19 AM

## 2023-08-23 NOTE — Plan of Care (Signed)
  Problem: Education: Goal: Ability to describe self-care measures that may prevent or decrease complications (Diabetes Survival Skills Education) will improve Outcome: Progressing Goal: Individualized Educational Video(s) Outcome: Progressing   Problem: Coping: Goal: Ability to adjust to condition or change in health will improve Outcome: Progressing   Problem: Fluid Volume: Goal: Ability to maintain a balanced intake and output will improve Outcome: Progressing   Problem: Health Behavior/Discharge Planning: Goal: Ability to identify and utilize available resources and services will improve Outcome: Progressing Goal: Ability to manage health-related needs will improve Outcome: Progressing   Problem: Metabolic: Goal: Ability to maintain appropriate glucose levels will improve Outcome: Progressing   Problem: Nutritional: Goal: Maintenance of adequate nutrition will improve Outcome: Progressing Goal: Progress toward achieving an optimal weight will improve Outcome: Progressing   Problem: Skin Integrity: Goal: Risk for impaired skin integrity will decrease Outcome: Progressing   Problem: Tissue Perfusion: Goal: Adequacy of tissue perfusion will improve Outcome: Progressing   Problem: Education: Goal: Knowledge of General Education information will improve Description: Including pain rating scale, medication(s)/side effects and non-pharmacologic comfort measures Outcome: Progressing   Problem: Health Behavior/Discharge Planning: Goal: Ability to manage health-related needs will improve Outcome: Progressing   Problem: Clinical Measurements: Goal: Ability to maintain clinical measurements within normal limits will improve Outcome: Progressing Goal: Will remain free from infection Outcome: Progressing Goal: Diagnostic test results will improve Outcome: Progressing Goal: Respiratory complications will improve Outcome: Progressing Goal: Cardiovascular complication will  be avoided Outcome: Progressing   Problem: Activity: Goal: Risk for activity intolerance will decrease Outcome: Progressing   Problem: Nutrition: Goal: Adequate nutrition will be maintained Outcome: Progressing   Problem: Coping: Goal: Level of anxiety will decrease Outcome: Progressing   Problem: Elimination: Goal: Will not experience complications related to bowel motility Outcome: Progressing Goal: Will not experience complications related to urinary retention Outcome: Progressing   Problem: Pain Management: Goal: General experience of comfort will improve Outcome: Progressing   Problem: Safety: Goal: Ability to remain free from injury will improve Outcome: Progressing   Problem: Skin Integrity: Goal: Risk for impaired skin integrity will decrease Outcome: Progressing   Problem: Education: Goal: Knowledge of the prescribed therapeutic regimen will improve Outcome: Progressing Goal: Ability to verbalize activity precautions or restrictions will improve Outcome: Progressing Goal: Understanding of discharge needs will improve Outcome: Progressing   Problem: Activity: Goal: Ability to perform//tolerate increased activity and mobilize with assistive devices will improve Outcome: Progressing   Problem: Clinical Measurements: Goal: Postoperative complications will be avoided or minimized Outcome: Progressing   Problem: Self-Care: Goal: Ability to meet self-care needs will improve Outcome: Progressing   Problem: Self-Concept: Goal: Ability to maintain and perform role responsibilities to the fullest extent possible will improve Outcome: Progressing   Problem: Pain Management: Goal: Pain level will decrease with appropriate interventions Outcome: Progressing   Problem: Skin Integrity: Goal: Demonstration of wound healing without infection will improve Outcome: Progressing

## 2023-08-24 ENCOUNTER — Other Ambulatory Visit (HOSPITAL_COMMUNITY): Payer: Self-pay

## 2023-08-24 DIAGNOSIS — J9601 Acute respiratory failure with hypoxia: Secondary | ICD-10-CM | POA: Diagnosis not present

## 2023-08-24 LAB — COMPREHENSIVE METABOLIC PANEL
ALT: 12 U/L (ref 0–44)
AST: 13 U/L — ABNORMAL LOW (ref 15–41)
Albumin: 1.7 g/dL — ABNORMAL LOW (ref 3.5–5.0)
Alkaline Phosphatase: 50 U/L (ref 38–126)
Anion gap: 6 (ref 5–15)
BUN: 47 mg/dL — ABNORMAL HIGH (ref 8–23)
CO2: 30 mmol/L (ref 22–32)
Calcium: 8.5 mg/dL — ABNORMAL LOW (ref 8.9–10.3)
Chloride: 100 mmol/L (ref 98–111)
Creatinine, Ser: 1.24 mg/dL (ref 0.61–1.24)
GFR, Estimated: >60 mL/min (ref >60)
Glucose, Bld: 89 mg/dL (ref 70–99)
Potassium: 4.3 mmol/L (ref 3.5–5.1)
Sodium: 136 mmol/L (ref 135–145)
Total Bilirubin: 0.7 mg/dL (ref <1.2)
Total Protein: 5.9 g/dL — ABNORMAL LOW (ref 6.5–8.1)

## 2023-08-24 LAB — CBC WITH DIFFERENTIAL/PLATELET
Abs Immature Granulocytes: 0.04 10*3/uL (ref 0.00–0.07)
Basophils Absolute: 0.1 10*3/uL (ref 0.0–0.1)
Basophils Relative: 1 %
Eosinophils Absolute: 0.5 10*3/uL (ref 0.0–0.5)
Eosinophils Relative: 6 %
HCT: 27.9 % — ABNORMAL LOW (ref 39.0–52.0)
Hemoglobin: 9.1 g/dL — ABNORMAL LOW (ref 13.0–17.0)
Immature Granulocytes: 1 %
Lymphocytes Relative: 13 %
Lymphs Abs: 1.1 10*3/uL (ref 0.7–4.0)
MCH: 29.6 pg (ref 26.0–34.0)
MCHC: 32.6 g/dL (ref 30.0–36.0)
MCV: 90.9 fL (ref 80.0–100.0)
Monocytes Absolute: 0.5 10*3/uL (ref 0.1–1.0)
Monocytes Relative: 6 %
Neutro Abs: 6.1 10*3/uL (ref 1.7–7.7)
Neutrophils Relative %: 73 %
Platelets: 264 10*3/uL (ref 150–400)
RBC: 3.07 MIL/uL — ABNORMAL LOW (ref 4.22–5.81)
RDW: 19.4 % — ABNORMAL HIGH (ref 11.5–15.5)
WBC: 8.2 10*3/uL (ref 4.0–10.5)
nRBC: 0 % (ref 0.0–0.2)

## 2023-08-24 LAB — TYPE AND SCREEN
ABO/RH(D): O POS
Antibody Screen: NEGATIVE
Unit division: 0
Unit division: 0

## 2023-08-24 LAB — BPAM RBC
Blood Product Expiration Date: 202501172359
Blood Product Expiration Date: 202501122359
ISSUE DATE / TIME: 202412221008
ISSUE DATE / TIME: 202412221344
Unit Type and Rh: 202501172359
Unit Type and Rh: 5100
Unit Type and Rh: 5100

## 2023-08-24 LAB — GLUCOSE, CAPILLARY
Glucose-Capillary: 69 mg/dL — ABNORMAL LOW (ref 70–99)
Glucose-Capillary: 153 mg/dL — ABNORMAL HIGH (ref 70–99)
Glucose-Capillary: 163 mg/dL — ABNORMAL HIGH (ref 70–99)
Glucose-Capillary: 272 mg/dL — ABNORMAL HIGH (ref 70–99)

## 2023-08-24 MED ORDER — SACUBITRIL-VALSARTAN 24-26 MG PO TABS
1.0000 | ORAL_TABLET | Freq: Two times a day (BID) | ORAL | Status: DC
  Administered 2023-08-24 – 2023-08-25 (×3): 1 via ORAL
  Filled 2023-08-24 (×3): qty 1

## 2023-08-24 MED ORDER — SENNOSIDES-DOCUSATE SODIUM 8.6-50 MG PO TABS
1.0000 | ORAL_TABLET | Freq: Two times a day (BID) | ORAL | Status: DC
  Administered 2023-08-24 – 2023-08-28 (×9): 1 via ORAL
  Filled 2023-08-24 (×9): qty 1

## 2023-08-24 MED ORDER — INSULIN DETEMIR 100 UNIT/ML ~~LOC~~ SOLN
8.0000 [IU] | Freq: Two times a day (BID) | SUBCUTANEOUS | Status: DC
  Administered 2023-08-24 – 2023-09-03 (×20): 8 [IU] via SUBCUTANEOUS
  Filled 2023-08-24 (×23): qty 0.08

## 2023-08-24 NOTE — Progress Notes (Signed)
PROGRESS NOTE  Earl Calderon  DOB: [birth year]  PCP: Melvenia Beam, MD BJY:[id number]  DOA: 08/07/2023  LOS: 17 days  Hospital Day: 18  Brief narrative: Earl Calderon is a 72 y.o. male with PMH significant for DM2, HTN, HLD, CAD/PCI, combined systolic and diastolic CHF, CKD, MGUS  12/6, patient presented to the ED with complaint of shortness of breath  At baseline, patient is able to ambulate independently.  For 2 weeks prior to presentation, patient had progressive shortness of breath and ultimately became short of breath even at rest.  Also reported associated anterior chest discomfort, worsening bilateral chronic lower extremity wounds. On exam, he was noted to have worsening lower extremity wounds. On workup, CTA chest did not show any evidence of pulm embolism Admitted to [facility] with acute respiratory failure with hypoxia secondary to CHF exacerbation as well as lower extremity wounds  12/9, underwent aortogram with bilateral lower extremity angiogram 12/13, underwent bilateral BKA See below for details  Subjective: Patient was seen and examined this morning. Pleasant elderly African-American male.  Lying on bed.  Not in distress.  Alert, awake, oriented x 3 today. Chart reviewed For the last 24 hours, remains bradycardic in 50s, blood pressure in 160s, breathing on room air Blood work from this morning with the renal function hemoglobin low but stable, blood sugar level was low at 69 this morning  Assessment and plan: Acute exacerbation of chronic combined systolic and diastolic CHF Essential hypertension Presented with shortness of breath, hypoxia on ambulation Previous echo with EF 30 to 35% Repeat echo 12/10 showed improvement with EF 55 to 60%, no WMA, moderate concentric LVH, grade 2 diastolic dysfunction, severely dilated LA Currently on carvedilol 3.125 mg twice daily, Imdur 20 mg twice daily, hydralazine 25 mg every 8 hours Heart rate in 50s.  Blood  pressure elevated to 160s but diastolic is low Entresto, Aldactone and Marcelline Deist were stopped because of AKI Creatinine has improved.  I will resume Entresto at low-dose of 24/26 mg twice daily.  Stop hydralazine scheduled.  Continue monitor blood pressure and renal function  Acute respiratory failure with hypoxia Initially hypoxic on minimal movement Currently not requiring oxygen at rest  PAD s/p arteriogram 12/9 both feet gangrene S/p bilateral BKA -12/13 Dr. Lajoyce Corners Currently on aspirin 81 mg daily, Pain control oxycodone as needed, tramadol as needed  AKI on CKD 3a Baseline creatinine less than 1.3.  Patient's creatinine worsened to peak at 6.29.  Nephrology was consulted.  AKI was likely due to contrast exposure as well as diuretics Creatinine has ultimately improved back to baseline.  Continue to monitor Recent Labs    08/11/23 0259 08/12/23 0334 08/13/23 0250 08/14/23 0344 08/16/23 0352 08/17/23 0321 08/19/23 1229 08/21/23 0820 08/22/23 1022 08/23/23 0607 08/24/23 0546  BUN 44* 48* 48* 40* 41* 32* 40*  --  42* 44* 47*  CREATININE 5.92* 6.29* 5.28* 3.40* 2.17* 1.49* 1.52* 1.38* 1.19 1.39* 1.24   Acute blood loss anemia Multifactorial-mostly secondary to surgical blood loss. IV iron was avoided due to extremity wounds. Received total of 5 units of PRBC this hospitalization, last transfusion was yesterday 12/22 Hemoglobin stable in the last 12 hours.  Continue to monitor Continue oral iron supplement Recent Labs    08/08/23 1823 08/09/23 0701 08/19/23 1229 08/19/23 1822 08/22/23 1022 08/23/23 0607 08/23/23 2008 08/24/23 0546  HGB 9.9*   < > 7.6*  --  6.7* 6.8* 9.3* 9.1*  MCV 93.5   < > 97.2  --  97.7 99.1  91.8 90.9  VITAMINB12 3,571*  --   --   --   --   --   --   --   FOLATE 12.5  --   --   --   --   --   --   --   TIBC  --   --   --  239*  --   --   --   --   IRON  --   --   --  49  --   --   --   --    < > = values in this interval not displayed.   Acute  metabolic encephalopathy Likely has underlying dementia CT head without acute abnormality. Currently on Seroquel 25 mg mg/50 mg mg -am/pm  Type 2 diabetes mellitus uncontrolled Hypoglycemia A1c 9.1 on 08/07/2023 Currently on Levemir 10 units twice daily, SSI/Accu-Cheks Blood sugar level low at 69 this morning.  Will reduce Levemir to 18 units twice daily. Recent Labs  Lab 08/23/23 1254 08/23/23 1615 08/23/23 2140 08/24/23 0639 08/24/23 1115  GLUCAP 133* 146* 189* 69* 153*   MGUS Outpatient follow-up with Dr. [name], last seen on 12/05/2022  Mobility: Bedbound status after bilateral lower extremity amputation  Goals of care   Code Status: Full Code     DVT prophylaxis:  enoxaparin (LOVENOX) injection 40 mg Start: 08/19/23 1000   Antimicrobials: None currently Fluid: None Consultants: Orthopedics Family Communication: None at bedside  Status: Inpatient Level of care:  Telemetry Medical   Patient is from: Home Needs to continue in-hospital care: Ongoing assessment of CHF meds.  Pending insurance approval for SNF   Diet:  Diet Order             Diet - low sodium heart healthy           Diet Carb Modified Fluid consistency: Thin; Room service appropriate? Yes  Diet effective now                   Scheduled Meds:  vitamin C  1,000 mg Oral Daily   aspirin EC  81 mg Oral Daily   carvedilol  3.125 mg Oral BID WC   enoxaparin (LOVENOX) injection  40 mg Subcutaneous Q24H   insulin aspart  0-6 Units Subcutaneous TID WC   insulin detemir  8 Units Subcutaneous BID   iron polysaccharides  150 mg Oral Daily   isosorbide dinitrate  20 mg Oral BID   lidocaine  1 patch Transdermal Q24H   nutrition supplement (JUVEN)  1 packet Oral BID BM   QUEtiapine  25 mg Oral Q breakfast   QUEtiapine  50 mg Oral QPM   sacubitril-valsartan  1 tablet Oral BID   senna-docusate  1 tablet Oral BID   sodium chloride flush  3 mL Intravenous Q12H   zinc sulfate (50mg  elemental zinc)   220 mg Oral Daily    PRN meds: acetaminophen **OR** acetaminophen, labetalol, LORazepam **OR** LORazepam, ondansetron (ZOFRAN) IV, oxyCODONE, polyethylene glycol   Infusions:    Antimicrobials: Anti-infectives (From admission, onward)    Start     Dose/Rate Route Frequency Ordered Stop   08/14/23 1015  vancomycin (VANCOCIN) powder  Status:  Discontinued          As needed 08/14/23 1015 08/14/23 1035   08/14/23 0815  ceFAZolin (ANCEF) IVPB 2g/100 mL premix        2 g 200 mL/hr over 30 Minutes Intravenous On call to O.R. 08/14/23 7829 08/14/23 1012  08/11/23 0800  cefTRIAXone (ROCEPHIN) 1 g in sodium chloride 0.9 % 100 mL IVPB        1 g 200 mL/hr over 30 Minutes Intravenous Daily 08/11/23 0559 08/15/23 2359   08/10/23 2200  vancomycin (VANCOCIN) IVPB 1000 mg/200 mL premix  Status:  Discontinued        1,000 mg 200 mL/hr over 60 Minutes Intravenous Every 48 hours 08/09/23 1047 08/10/23 1206   08/10/23 1206  vancomycin variable dose per unstable renal function (pharmacist dosing)  Status:  Discontinued         Does not apply See admin instructions 08/10/23 1206 08/10/23 1218   08/10/23 0600  cefUROXime (ZINACEF) 1.5 g in sodium chloride 0.9 % 100 mL IVPB        1.5 g 200 mL/hr over 30 Minutes Intravenous On call to O.R. 08/09/23 0846 08/11/23 0559   08/09/23 1400  piperacillin-tazobactam (ZOSYN) IVPB 2.25 g  Status:  Discontinued        2.25 g 100 mL/hr over 30 Minutes Intravenous Every 8 hours 08/09/23 1050 08/11/23 0557   08/08/23 2200  vancomycin (VANCOREADY) IVPB 750 mg/150 mL  Status:  Discontinued        750 mg 150 mL/hr over 60 Minutes Intravenous Every 24 hours 08/08/23 0015 08/09/23 1047   08/07/23 2330  Vancomycin (VANCOCIN) 1,500 mg in sodium chloride 0.9 % 500 mL IVPB        1,500 mg 250 mL/hr over 120 Minutes Intravenous  Once 08/07/23 2240 08/08/23 0740   08/07/23 2300  piperacillin-tazobactam (ZOSYN) IVPB 3.375 g  Status:  Discontinued        3.375 g 12.5 mL/hr  over 240 Minutes Intravenous Every 8 hours 08/07/23 2240 08/09/23 1050   08/07/23 1715  cefTRIAXone (ROCEPHIN) 1 g in sodium chloride 0.9 % 100 mL IVPB        1 g 200 mL/hr over 30 Minutes Intravenous  Once 08/07/23 1707 08/07/23 1848       Objective: Vitals:   08/24/23 0429 08/24/23 0754  BP: (!) 145/69 (!) 169/84  Pulse: (!) 59 (!) 59  Resp: 19   Temp: 98.8 F (37.1 C) 97.6 F (36.4 C)  SpO2: 99% 92%    Intake/Output Summary (Last 24 hours) at 08/24/2023 1311 Last data filed at 08/24/2023 1100 Gross per 24 hour  Intake --  Output 1300 ml  Net -1300 ml   Filed Weights   08/07/23 2223 08/14/23 0813  Weight: 64.9 kg 68 kg   Weight change:  Body mass index is 22.15 kg/m.   Physical Exam: General exam: Pleasant, elderly African-American male. Skin: No rashes, lesions or ulcers. HEENT: Atraumatic, normocephalic, no obvious bleeding Lungs: Clear to auscultation bilaterally,  CVS: S1, S2, no murmur GI/Abd: Soft, nontender, nondistended, bowel sound present CNS: Alert, awake, oriented x 3 today.  Not delirious Psychiatry: Mood appropriate,  Extremities: No pedal edema, no calf tenderness,   Data Review: I have personally reviewed the laboratory data and studies available.  F/u labs  Unresulted Labs (From admission, onward)     Start     Ordered   08/14/23 0500  Creatinine, serum  (enoxaparin (LOVENOX)    CrCl >/= 30 ml/min)  Weekly,   R     Comments: while on enoxaparin therapy    08/07/23 2044           Total time spent in review of labs and imaging, patient evaluation, formulation of plan, documentation and communication with family: 55 minutes  Signed, Lorin Glass, MD [facility] 08/24/2023

## 2023-08-24 NOTE — Inpatient Diabetes Management (Signed)
Inpatient Diabetes Program Recommendations  AACE/ADA: New Consensus Statement on Inpatient Glycemic Control   Target Ranges:  Prepandial:   less than 140 mg/dL      Peak postprandial:   less than 180 mg/dL (1-2 hours)      Critically ill patients:  140 - 180 mg/dL    Latest Reference Range & Units 08/23/23 09:00 08/23/23 12:54 08/23/23 16:15 08/23/23 21:40 08/24/23 06:39  Glucose-Capillary 70 - 99 mg/dL 90 244 (H) 010 (H) 272 (H) 69 (L)  (H): Data is abnormally high (L): Data is abnormally low  Review of Glycemic Control  Current orders for Inpatient glycemic control: Levemir 10 units BID, Novolog 0-6 units TID with meals  Inpatient Diabetes Program Recommendations:    Insulin: CBG 69 mg/dl this morning. Please consider decreasing Levemir to 8  units BID.  Thanks, Orlando Penner, RN, MSN, CDCES Diabetes Coordinator Inpatient Diabetes Program [phone] (Team Pager from 8am to 5pm)

## 2023-08-24 NOTE — Progress Notes (Signed)
Physical Therapy Treatment Patient Details Name: Earl Calderon MRN: [id number] DOB: [birth year] Today's Date: 08/24/2023   History of Present Illness Patient is a 72 yo male presenting to the ED with SOB and fatigue on 08/07/23. Admitted with acute respiratory failure with hypoxia. Found to have bilateral lower extremity wounds, gangrene, chronic limb ischemia. Arteriogram performed on 12/9. 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  PMH includes: CAD, CKD stage III, DM type II, uncontrolled, MGUS, chronic systolic CHF, NSVT, resistant hypertension, hyperlipidemia.    PT Comments  Pt received in supine, agreeable to therapy session and with good participation in supine exercise/positional education and transfer training from bed to [facility] via posterior scoot using BUE to offload his hips. Pt benefits from multimodal cues due to slow processing, perseveration on pain and anxiety due to fear of pain/falls. Pt with good progress toward goals today and following instructions as able. RN notified on pt c/o peri area skin breakdown/scrotal swelling and tenderness. Pt will continue to benefit from skilled rehab in a post acute setting < 3 hours per day to maximize functional gains before returning home.    If plan is discharge home, recommend the following: Two people to help with walking and/or transfers;Assistance with cooking/housework;Assist for transportation;Help with stairs or ramp for entrance;Supervision due to cognitive status;Two people to help with bathing/dressing/bathroom   Can travel by private vehicle     No  Equipment Recommendations  Wheelchair (measurements PT);Wheelchair cushion (measurements PT);Hoyer lift;Other (comment);Hospital bed (ramp, drop arm BSC, slide board)    Recommendations for Other Services Other (comment) (consider more thorough cog work-up, however pt starting to participate better)     Precautions / Restrictions Precautions Precautions: Fall Precaution Comments:  significant scrotal/bottom discomfort Restrictions Weight Bearing Restrictions Per Provider Order: Yes RLE Weight Bearing Per Provider Order: Non weight bearing LLE Weight Bearing Per Provider Order: Non weight bearing     Mobility  Bed Mobility Overal bed mobility: Needs Assistance Bed Mobility: Supine to Sit, Rolling Rolling: Min assist, Used rails   Supine to sit: Mod assist, +2 for safety/equipment, HOB elevated, Used rails Sit to supine: Min assist, Used rails   General bed mobility comments: mulitmodal cues for sequencing and safety    Transfers Overall transfer level: Needs assistance Equipment used: None (bed pad) Transfers: Bed to chair/wheelchair/BSC         Anterior-Posterior transfers: Max assist, Mod assist, +2 physical assistance   General transfer comment: EOB>BSC with +2 maxA, pt has difficulty fully extending his elbows for lifting both hips off the bed and pt attempting to use residual limbs to assist; pt cued to avoid WB through his limbs due to NWB restriction while limbs heal. Heavy +2 assist at bed pad to offload sore peri area. RN notified of pt discomfort.    Ambulation/Gait               General Gait Details: not applicable   Stairs             Wheelchair Mobility     Tilt Bed    Modified Rankin (Stroke Patients Only)       Balance Overall balance assessment: Needs assistance Sitting-balance support: Bilateral upper extremity supported, Feet unsupported Sitting balance-Leahy Scale: Fair Sitting balance - Comments: CGA for safety at EOB, once on BSC pt with increased posterior lean and cued to avoid this due to increased fall risk; fall mat placed behind [facility] for pt safety. Postural control: Posterior lean     Standing balance comment: Not  applicable                            Cognition Arousal: Alert Behavior During Therapy: WFL for tasks assessed/performed, Anxious Overall Cognitive Status: No  family/caregiver present to determine baseline cognitive functioning                                 General Comments: Appears to have short term memory deficits; pt anxious in anticipation of pain and at times can be contradictory, but listens and willing to attempt tasks as PTA instructs him when given time to process instructions.        Exercises Amputee Exercises Quad Sets: AROM, Both, Supine (a few reps for instruction/teachback) Towel Squeeze: AROM, Both, 5 reps, Supine Knee Extension: AROM, Right, Supine (a couple reps to prepare for quad set) Straight Leg Raises: Supine, AAROM, Right (removal of pillow, a few reps while repositioning) Chair Push Up:  (pt having difficulty coordinating with both arms together)    General Comments General comments (skin integrity, edema, etc.): Pt received with R knee in flexion and with c/o pain in groin region and bowel urgency, his scrotum appearing somewhat swollen and pt c/o raw/tender feeling, so barrier ointment applied to this area. Pt on BSC at end of session, call bell in reach and pt agreeable to call out when done toileting, pt with urinal in place to capture urine, as due to posture unable to urinate down into BSC.      Pertinent Vitals/Pain Pain Assessment Pain Assessment: Faces Faces Pain Scale: Hurts whole lot Pain Location: bottom and scrotal area with scooting, and bil surgical incisions on limbs Pain Descriptors / Indicators: Guarding, Grimacing, Discomfort, Moaning, Sharp, Throbbing Pain Intervention(s): Limited activity within patient's tolerance, Monitored during session, Premedicated before session, Repositioned (discussed rolling Q2h and notifying staff for assist; pt with hip under his R hip and bottom at end of session)     PT Goals (current goals can now be found in the care plan section) Acute Rehab PT Goals Patient Stated Goal: to go home and work toward bil prosthetic limbs/walking PT Goal Formulation:  With patient Time For Goal Achievement: 08/29/23 Progress towards PT goals: Progressing toward goals    Frequency    Min 1X/week      PT Plan         AM-PAC PT "6 Clicks" Mobility   Outcome Measure  Help needed turning from your back to your side while in a flat bed without using bedrails?: A Lot (without rail) Help needed moving from lying on your back to sitting on the side of a flat bed without using bedrails?: A Lot Help needed moving to and from a bed to a chair (including a wheelchair)?: Total (+2 assist) Help needed standing up from a chair using your arms (e.g., wheelchair or bedside chair)?: Total Help needed to walk in hospital room?: Total Help needed climbing 3-5 steps with a railing? : Total 6 Click Score: 8    End of Session Equipment Utilized During Treatment: Gait belt Activity Tolerance: Patient tolerated treatment well;Patient limited by pain Patient left: with call bell/phone within reach;in chair;Other (comment) (fall mat behind BSC, BSC facing bed for fall risk prevention, pt with call bell in reach and A&O, agreeable to alert staff once done on [facility]) Nurse Communication: Mobility status;Need for lift equipment;Patient requests pain meds;Precautions;Other (comment) (c/o peri  and scrotal area discomfort and pt appears to have scrotal swelling) PT Visit Diagnosis: Other abnormalities of gait and mobility (R26.89)     Time: 1610-9604 PT Time Calculation (min) (ACUTE ONLY): 13 min  Charges:    $Therapeutic Activity: 8-22 mins PT General Charges $$ ACUTE PT VISIT: 1 Visit                     Melquisedec Journey P., PTA Acute Rehabilitation Services Secure Chat Preferred 9a-5:30pm Office: [phone]    Dorathy Kinsman [facility] 08/24/2023, 12:21 PM

## 2023-08-24 NOTE — Progress Notes (Signed)
Physical Therapy Treatment Patient Details Name: Earl Calderon MRN: [id number] DOB: 1951-02-10 Today's Date: 08/24/2023   History of Present Illness Patient is a 72 yo male presenting to the ED with SOB and fatigue on 08/07/23. Admitted with acute respiratory failure with hypoxia. Found to have bilateral lower extremity wounds, gangrene, chronic limb ischemia. Arteriogram performed on 12/9. 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  PMH includes: CAD, CKD stage III, DM type II, uncontrolled, MGUS, chronic systolic CHF, NSVT, resistant hypertension, hyperlipidemia.    PT Comments  Pt received on BSC, pt agreeable to therapy session as RN not available to assist NT with returning pt from [facility] to supine. Pt needing slightly decreased assist, up to +2 modA for anterior scoot via chair push-up from [facility] and dense multimodal cues for technique/sequencing. Prior to transfer, pt leaned to L/R sides for peri-care after toileting and placement of bed pad under hips while on BSC, pt needing +2 for safety to perform lateral lean on BSC due to difficulty lifting residual limbs while also leaning toward opposite sides. Pt reports moderate to severe pain and does not appear he was premedicated although this was requested, RN notified of pt pain score. Pt will continue to benefit from skilled rehab in a post acute setting to maximize functional gains before returning home.     If plan is discharge home, recommend the following: Two people to help with walking and/or transfers;Assistance with cooking/housework;Assist for transportation;Help with stairs or ramp for entrance;Supervision due to cognitive status;Two people to help with bathing/dressing/bathroom   Can travel by private vehicle     No  Equipment Recommendations  Wheelchair (measurements PT);Wheelchair cushion (measurements PT);Hoyer lift;Other (comment);Hospital bed (ramp, drop arm BSC, slide board)    Recommendations for Other Services Other (comment)  (consider more thorough cog work-up, however pt starting to participate better)     Precautions / Restrictions Precautions Precautions: Fall Precaution Comments: significant scrotal/bottom discomfort Restrictions Weight Bearing Restrictions Per Provider Order: Yes RLE Weight Bearing Per Provider Order: Non weight bearing LLE Weight Bearing Per Provider Order: Non weight bearing     Mobility  Bed Mobility Overal bed mobility: Needs Assistance Bed Mobility: Rolling, Sit to Supine Rolling: Min assist, Used rails, +2 for safety/equipment   Supine to sit: Mod assist, +2 for safety/equipment, HOB elevated, Used rails Sit to supine: Min assist, Used rails, HOB elevated (HOB elevated per pt request)   General bed mobility comments: mulitmodal cues for sequencing and safety, rolling to his L for pillow placement under R hip after return to supine for pressure offloading; use of bed features/rails for bed mobility    Transfers Overall transfer level: Needs assistance Equipment used: None (bed pad) Transfers: Bed to chair/wheelchair/BSC         Anterior-Posterior transfers: Mod assist, +2 physical assistance, From elevated surface   General transfer comment: BSC>EOB with slightly less assist for return transfer as pt better understanding how to perform chair push-up from [facility] after further cues/demo and bed pad assist.    Ambulation/Gait               General Gait Details: not applicable   Stairs             Wheelchair Mobility     Tilt Bed    Modified Rankin (Stroke Patients Only)       Balance Overall balance assessment: Needs assistance Sitting-balance support: Bilateral upper extremity supported, Feet unsupported Sitting balance-Leahy Scale: Fair Sitting balance - Comments: CGA for safety at EOB,  once on [facility] pt with increased posterior lean and cued to avoid this due to increased fall risk; fall mat placed behind [facility] for pt safety. Postural control:  Posterior lean     Standing balance comment: Not applicable                            Cognition Arousal: Alert Behavior During Therapy: WFL for tasks assessed/performed, Anxious Overall Cognitive Status: No family/caregiver present to determine baseline cognitive functioning                                 General Comments: Pt anxious in anticipation of pain requiring multimodal cues. Pt does better with return (anterior scoot) transfer after initial difficulty with understanding posterior scoot/chair push-up technique on first session.        Exercises Amputee Exercises Quad Sets: AROM, Both, Supine (a few reps for instruction/teachback) Towel Squeeze: AROM, Both, 5 reps, Supine Knee Extension: AROM, Right, Supine (a couple reps to prepare for quad set) Straight Leg Raises: Supine, AAROM, Right (removal of pillow, a few reps while repositioning) Chair Push Up: AROM, AAROM, Both, 5 reps, Seated (from BSC and mattress (5 total reps during session) with improved technique in second session) Other Exercises Other Exercises: lateral lean to L/R side with hip flexion x2 reps ea to allow for placement of bed pad while on BSC; +2 for safety and mobility assist    General Comments General comments (skin integrity, edema, etc.): RN notified of pt bottom and scrotal/peri area discomfort, NT present during session to assist wtih pt hygiene, pt attempted but was not successful in having a BM on BSC but did use urinal.      Pertinent Vitals/Pain Pain Assessment Pain Assessment: 0-10 Pain Score: 7  Faces Pain Scale: Hurts whole lot Pain Location: bottom and scrotal area with scooting, and bil surgical incisions on limbs Pain Descriptors / Indicators: Guarding, Grimacing, Discomfort, Moaning, Sharp, Throbbing Pain Intervention(s): Limited activity within patient's tolerance, Monitored during session, Premedicated before session, Repositioned, Patient requesting pain  meds-RN notified, Other (comment) (pt instructed on rolling Q2H for pressure relief and notifying staff as soon as he needs peri-care or if he needs assist to roll and place pillows for pressure relief.) RN had planned to premedicate and was contacted regarding this ~1 hr prior to session but per chart review, does not appear RN charted pain meds prior to sessions.    Home Living                          Prior Function            PT Goals (current goals can now be found in the care plan section) Acute Rehab PT Goals Patient Stated Goal: to go home and work toward bil prosthetic limbs/walking PT Goal Formulation: With patient Time For Goal Achievement: 08/29/23 Progress towards PT goals: Progressing toward goals    Frequency    Min 1X/week      PT Plan      Co-evaluation              AM-PAC PT "6 Clicks" Mobility   Outcome Measure  Help needed turning from your back to your side while in a flat bed without using bedrails?: A Lot (without rail) Help needed moving from lying on your back to sitting on the  side of a flat bed without using bedrails?: A Lot Help needed moving to and from a bed to a chair (including a wheelchair)?: Total (+2 assist) Help needed standing up from a chair using your arms (e.g., wheelchair or bedside chair)?: Total Help needed to walk in hospital room?: Total Help needed climbing 3-5 steps with a railing? : Total 6 Click Score: 8    End of Session Equipment Utilized During Treatment: Gait belt;Other (comment) (bed pad assist) Activity Tolerance: Patient tolerated treatment well;Patient limited by pain Patient left: with call bell/phone within reach;Other (comment);in bed;with bed alarm set (semi sidelying toward his L hip with pillow to relieve pressure under his R hip/lower back and towel under distal limb for comfort, pt with bil knees extended) Nurse Communication: Mobility status;Need for lift equipment;Patient requests pain  meds;Precautions;Other (comment) (c/o peri and scrotal area discomfort and pt appears to have scrotal swelling) PT Visit Diagnosis: Other abnormalities of gait and mobility (R26.89)     Time: 6387-5643 PT Time Calculation (min) (ACUTE ONLY): 15 min  Charges:    $Therapeutic Activity: 8-22 mins PT General Charges $$ ACUTE PT VISIT: 1 Visit                     Ladasha Schnackenberg P., PTA Acute Rehabilitation Services Secure Chat Preferred 9a-5:30pm Office: [phone]    Dorathy Kinsman [facility] 08/24/2023, 1:11 PM

## 2023-08-24 NOTE — Plan of Care (Signed)
  Problem: Coping: Goal: Ability to adjust to condition or change in health will improve Outcome: Progressing   Problem: Fluid Volume: Goal: Ability to maintain a balanced intake and output will improve Outcome: Progressing   Problem: Health Behavior/Discharge Planning: Goal: Ability to identify and utilize available resources and services will improve Outcome: Progressing   Problem: Nutritional: Goal: Maintenance of adequate nutrition will improve Outcome: Progressing

## 2023-08-24 NOTE — TOC Progression Note (Addendum)
Transition of Care [facility]) - Progression Note    Patient Details  Name: Earl Calderon MRN: [id number] Date of Birth: 1950/09/18  Transition of Care [facility]) CM/SW Contact  Erin Sons, [place] Phone Number: 08/24/2023, 9:38 AM  Clinical Narrative:     SNF [facility] is pending at this time. Insurance is requesting more recent therapy note. CSW will upload therapy note in online portal once available.   1255: PT note uploaded to online portal. CSW will continue to follow for SNF auth.   Expected Discharge Plan: Home w Home Health Services Barriers to Discharge: Continued Medical Work up  Expected Discharge Plan and Services In-house Referral: Clinical Social Work Discharge Planning Services: CM Consult                     DME Arranged: Other see comment (hoyer lift) DME Agency: [employer] Date DME Agency Contacted: 08/21/23 Time DME Agency Contacted: [phone] Representative spoke with at DME Agency: Ian Malkin HH Arranged: PT, OT HH Agency: [facility] Date [facility] Agency Contacted: 08/20/23 Time HH Agency Contacted: 1530 Representative spoke with at [facility] Agency: Amy   Social Determinants of Health (SDOH) Interventions SDOH Screenings   Food Insecurity: No Food Insecurity (08/07/2023)  Housing: Low Risk  (08/07/2023)  Transportation Needs: No Transportation Needs (08/07/2023)  Utilities: Not At Risk (08/07/2023)  Depression (PHQ2-9): Low Risk  (10/18/2020)  Financial Resource Strain: Medium Risk (09/21/2020)  Tobacco Use: Low Risk  (08/14/2023)    Readmission Risk Interventions     No data to display

## 2023-08-25 ENCOUNTER — Inpatient Hospital Stay (HOSPITAL_COMMUNITY): Payer: 59

## 2023-08-25 DIAGNOSIS — J9601 Acute respiratory failure with hypoxia: Secondary | ICD-10-CM | POA: Diagnosis not present

## 2023-08-25 LAB — GLUCOSE, CAPILLARY
Glucose-Capillary: 134 mg/dL — ABNORMAL HIGH (ref 70–99)
Glucose-Capillary: 141 mg/dL — ABNORMAL HIGH (ref 70–99)
Glucose-Capillary: 77 mg/dL (ref 70–99)
Glucose-Capillary: 203 mg/dL — ABNORMAL HIGH (ref 70–99)

## 2023-08-25 MED ORDER — SACUBITRIL-VALSARTAN 97-103 MG PO TABS
1.0000 | ORAL_TABLET | Freq: Two times a day (BID) | ORAL | Status: DC
  Administered 2023-08-25 – 2023-09-03 (×18): 1 via ORAL
  Filled 2023-08-25 (×19): qty 1

## 2023-08-25 NOTE — TOC Progression Note (Addendum)
Transition of Care [facility]) - Progression Note    Patient Details  Name: Camp Kreger MRN: [id number] Date of Birth: 11-08-1950  Transition of Care [facility]) CM/SW Contact  Erin Sons, [place] Phone Number: 08/25/2023, 12:51 PM  Clinical Narrative:     [facility] is approved [id number] 12/23-12/26 though CSW unable to confirm bed with SNF  Left voicemail with Chyna in admissions.  Called Logan with regional admissions; she is unable to reach anyone at snf  1115: CSW called SNF and is transferred to [facility]. She takes CSW's info and states she will call CSW back shortly.   1249: CSW called SNF; phone rang for a while and then call ended.   1300: notified by Whitney Post that [facility] likely won't have a bed today. She will notify CSW if bed opens, otherwise will need to follow up Thursday for bed availability.    Expected Discharge Plan: Home w Home Health Services Barriers to Discharge: Continued Medical Work up  Expected Discharge Plan and Services In-house Referral: Clinical Social Work Discharge Planning Services: CM Consult                     DME Arranged: Other see comment (hoyer lift) DME Agency: [employer] Date DME Agency Contacted: 08/21/23 Time DME Agency Contacted: [phone] Representative spoke with at DME Agency: Ian Malkin HH Arranged: PT, OT HH Agency: [facility] Date [facility] Agency Contacted: 08/20/23 Time HH Agency Contacted: 1530 Representative spoke with at [facility] Agency: Amy   Social Determinants of Health (SDOH) Interventions SDOH Screenings   Food Insecurity: No Food Insecurity (08/07/2023)  Housing: Low Risk  (08/07/2023)  Transportation Needs: No Transportation Needs (08/07/2023)  Utilities: Not At Risk (08/07/2023)  Depression (PHQ2-9): Low Risk  (10/18/2020)  Financial Resource Strain: Medium Risk (09/21/2020)  Tobacco Use: Low Risk  (08/14/2023)    Readmission Risk Interventions     No data to display

## 2023-08-25 NOTE — Plan of Care (Signed)
  Problem: Education: Goal: Ability to describe self-care measures that may prevent or decrease complications (Diabetes Survival Skills Education) will improve Outcome: Progressing Goal: Individualized Educational Video(s) Outcome: Progressing   Problem: Coping: Goal: Ability to adjust to condition or change in health will improve Outcome: Progressing   Problem: Fluid Volume: Goal: Ability to maintain a balanced intake and output will improve Outcome: Progressing   Problem: Health Behavior/Discharge Planning: Goal: Ability to identify and utilize available resources and services will improve Outcome: Progressing Goal: Ability to manage health-related needs will improve Outcome: Progressing   Problem: Metabolic: Goal: Ability to maintain appropriate glucose levels will improve Outcome: Progressing   Problem: Nutritional: Goal: Maintenance of adequate nutrition will improve Outcome: Progressing Goal: Progress toward achieving an optimal weight will improve Outcome: Progressing   Problem: Skin Integrity: Goal: Risk for impaired skin integrity will decrease Outcome: Progressing   Problem: Tissue Perfusion: Goal: Adequacy of tissue perfusion will improve Outcome: Progressing   Problem: Education: Goal: Knowledge of General Education information will improve Description: Including pain rating scale, medication(s)/side effects and non-pharmacologic comfort measures Outcome: Progressing   Problem: Health Behavior/Discharge Planning: Goal: Ability to manage health-related needs will improve Outcome: Progressing   Problem: Clinical Measurements: Goal: Ability to maintain clinical measurements within normal limits will improve Outcome: Progressing Goal: Will remain free from infection Outcome: Progressing Goal: Diagnostic test results will improve Outcome: Progressing Goal: Respiratory complications will improve Outcome: Progressing Goal: Cardiovascular complication will  be avoided Outcome: Progressing   Problem: Activity: Goal: Risk for activity intolerance will decrease Outcome: Progressing   Problem: Nutrition: Goal: Adequate nutrition will be maintained Outcome: Progressing   Problem: Coping: Goal: Level of anxiety will decrease Outcome: Progressing   Problem: Elimination: Goal: Will not experience complications related to bowel motility Outcome: Progressing Goal: Will not experience complications related to urinary retention Outcome: Progressing   Problem: Pain Management: Goal: General experience of comfort will improve Outcome: Progressing   Problem: Safety: Goal: Ability to remain free from injury will improve Outcome: Progressing   Problem: Skin Integrity: Goal: Risk for impaired skin integrity will decrease Outcome: Progressing   Problem: Education: Goal: Knowledge of the prescribed therapeutic regimen will improve Outcome: Progressing Goal: Ability to verbalize activity precautions or restrictions will improve Outcome: Progressing Goal: Understanding of discharge needs will improve Outcome: Progressing   Problem: Activity: Goal: Ability to perform//tolerate increased activity and mobilize with assistive devices will improve Outcome: Progressing   Problem: Clinical Measurements: Goal: Postoperative complications will be avoided or minimized Outcome: Progressing   Problem: Self-Care: Goal: Ability to meet self-care needs will improve Outcome: Progressing   Problem: Self-Concept: Goal: Ability to maintain and perform role responsibilities to the fullest extent possible will improve Outcome: Progressing   Problem: Pain Management: Goal: Pain level will decrease with appropriate interventions Outcome: Progressing   Problem: Skin Integrity: Goal: Demonstration of wound healing without infection will improve Outcome: Progressing

## 2023-08-25 NOTE — Progress Notes (Signed)
PROGRESS NOTE  Earl Calderon  DOB: 08-May-1951  PCP: Melvenia Beam, MD ZOX:[id number]  DOA: 08/07/2023  LOS: 18 days  Hospital Day: 19  Brief narrative: Earl Calderon is a 72 y.o. male with PMH significant for DM2, HTN, HLD, CAD/PCI, combined systolic and diastolic CHF, CKD, MGUS 12/6, patient presented to the ED with complaint of shortness of breath  At baseline, patient is able to ambulate independently.  For 2 weeks prior to presentation, patient had progressive shortness of breath and ultimately became short of breath even at rest.  Also reported associated anterior chest discomfort, worsening bilateral chronic lower extremity wounds. On exam, he was noted to have worsening lower extremity wounds. On workup, CTA chest did not show any evidence of pulm embolism Admitted to [facility] with acute respiratory failure with hypoxia secondary to CHF exacerbation as well as lower extremity wounds  12/9, underwent aortogram with bilateral lower extremity angiogram 12/13, underwent bilateral BKA See below for details  Subjective: Patient was seen and examined this morning. Lying on bed.  Feels sad. He is expecting a rehab to be available soon for discharge.  I was alerted by nurse yesterday about his scrotal wound. On my exam, he has gross swelling of the scrotum and penis related to CHF. The scrotum has decubitus changes.  No active bleeding or pus oozing out.  Assessment and plan: Acute exacerbation of chronic combined systolic and diastolic CHF Essential hypertension Presented with shortness of breath, hypoxia on ambulation Previous echo with EF 30 to 35% Repeat echo 12/10 showed improvement with EF 55 to 60%, no WMA, moderate concentric LVH, grade 2 diastolic dysfunction, severely dilated LA Currently on carvedilol 3.125 mg twice daily, Imdur 20 mg twice daily, Entresto 24/26 mg twice daily  Heart rate stable.  Blood pressure elevated.   Increase Entresto to his home  dose of 97/103 mg twice daily.   Continue monitor blood pressure and renal function.  Scrotal swelling and decubitus injury On my exam, he has gross swelling of the scrotum and penis related to CHF. The scrotum has decubitus changes.  No active bleeding or pus oozing out. Wound care consulted.  Acute respiratory failure with hypoxia Initially hypoxic on minimal movement Currently not requiring oxygen at rest  PAD s/p arteriogram 12/9 both feet gangrene S/p bilateral BKA -12/13 Dr. Lajoyce Corners Currently on aspirin 81 mg daily Pain control oxycodone as needed, tramadol as needed  AKI on CKD 3a Baseline creatinine less than 1.3.  Patient's creatinine worsened to peak at 6.29.  Nephrology was consulted.  AKI was likely due to contrast exposure as well as diuretics. Creatinine has ultimately improved back to baseline.  Continue to monitor Recent Labs    08/11/23 0259 08/12/23 0334 08/13/23 0250 08/14/23 0344 08/16/23 0352 08/17/23 0321 08/19/23 1229 08/21/23 0820 08/22/23 1022 08/23/23 0607 08/24/23 0546  BUN 44* 48* 48* 40* 41* 32* 40*  --  42* 44* 47*  CREATININE 5.92* 6.29* 5.28* 3.40* 2.17* 1.49* 1.52* 1.38* 1.19 1.39* 1.24   Acute blood loss anemia Multifactorial-mostly secondary to surgical blood loss. IV iron was avoided due to extremity wounds. Received total of 5 units of PRBC this hospitalization, last transfusion on 12/22 Hemoglobin stable in the last 12 hours.  Continue to monitor Continue oral iron supplement Recent Labs    08/08/23 1823 08/09/23 0701 08/19/23 1229 08/19/23 1822 08/22/23 1022 08/23/23 0607 08/23/23 2008 08/24/23 0546  HGB 9.9*   < > 7.6*  --  6.7* 6.8* 9.3* 9.1*  MCV 93.5   < >  97.2  --  97.7 99.1 91.8 90.9  VITAMINB12 3,571*  --   --   --   --   --   --   --   FOLATE 12.5  --   --   --   --   --   --   --   TIBC  --   --   --  239*  --   --   --   --   IRON  --   --   --  49  --   --   --   --    < > = values in this interval not  displayed.   Acute metabolic encephalopathy Likely has underlying dementia CT head without acute abnormality. Currently on Seroquel 25 mg mg/50 mg mg -am/pm  Type 2 diabetes mellitus uncontrolled Hypoglycemia A1c 9.1 on 08/07/2023 Currently on Levemir 8 units twice daily, continue SSI/Accu-Cheks Recent Labs  Lab 08/24/23 0639 08/24/23 1115 08/24/23 1606 08/24/23 2246 08/25/23 0702  GLUCAP 69* 153* 163* 272* 134*   MGUS Outpatient follow-up with Dr. [name], last seen on 12/05/2022  Mobility: Bedbound status after bilateral lower extremity amputation  Goals of care   Code Status: Full Code     DVT prophylaxis:  enoxaparin (LOVENOX) injection 40 mg Start: 08/19/23 1000   Antimicrobials: None currently Fluid: None Consultants: Orthopedics Family Communication: None at bedside  Status: Inpatient Level of care:  Telemetry Medical   Patient is from: Home Needs to continue in-hospital care: Ongoing assessment of CHF meds.  Pending insurance approval for SNF   Diet:  Diet Order             Diet - low sodium heart healthy           Diet Carb Modified Fluid consistency: Thin; Room service appropriate? Yes  Diet effective now                   Scheduled Meds:  vitamin C  1,000 mg Oral Daily   aspirin EC  81 mg Oral Daily   carvedilol  3.125 mg Oral BID WC   enoxaparin (LOVENOX) injection  40 mg Subcutaneous Q24H   insulin aspart  0-6 Units Subcutaneous TID WC   insulin detemir  8 Units Subcutaneous BID   iron polysaccharides  150 mg Oral Daily   isosorbide dinitrate  20 mg Oral BID   lidocaine  1 patch Transdermal Q24H   nutrition supplement (JUVEN)  1 packet Oral BID BM   QUEtiapine  25 mg Oral Q breakfast   QUEtiapine  50 mg Oral QPM   sacubitril-valsartan  1 tablet Oral BID   senna-docusate  1 tablet Oral BID   sodium chloride flush  3 mL Intravenous Q12H   zinc sulfate (50mg  elemental zinc)  220 mg Oral Daily    PRN meds: acetaminophen **OR**  acetaminophen, labetalol, LORazepam **OR** LORazepam, ondansetron (ZOFRAN) IV, oxyCODONE, polyethylene glycol   Infusions:    Antimicrobials: Anti-infectives (From admission, onward)    Start     Dose/Rate Route Frequency Ordered Stop   08/14/23 1015  vancomycin (VANCOCIN) powder  Status:  Discontinued          As needed 08/14/23 1015 08/14/23 1035   08/14/23 0815  ceFAZolin (ANCEF) IVPB 2g/100 mL premix        2 g 200 mL/hr over 30 Minutes Intravenous On call to O.R. 08/14/23 0729 08/14/23 1012   08/11/23 0800  cefTRIAXone (ROCEPHIN) 1 g in  sodium chloride 0.9 % 100 mL IVPB        1 g 200 mL/hr over 30 Minutes Intravenous Daily 08/11/23 0559 08/15/23 2359   08/10/23 2200  vancomycin (VANCOCIN) IVPB 1000 mg/200 mL premix  Status:  Discontinued        1,000 mg 200 mL/hr over 60 Minutes Intravenous Every 48 hours 08/09/23 1047 08/10/23 1206   08/10/23 1206  vancomycin variable dose per unstable renal function (pharmacist dosing)  Status:  Discontinued         Does not apply See admin instructions 08/10/23 1206 08/10/23 1218   08/10/23 0600  cefUROXime (ZINACEF) 1.5 g in sodium chloride 0.9 % 100 mL IVPB        1.5 g 200 mL/hr over 30 Minutes Intravenous On call to O.R. 08/09/23 0846 08/11/23 0559   08/09/23 1400  piperacillin-tazobactam (ZOSYN) IVPB 2.25 g  Status:  Discontinued        2.25 g 100 mL/hr over 30 Minutes Intravenous Every 8 hours 08/09/23 1050 08/11/23 0557   08/08/23 2200  vancomycin (VANCOREADY) IVPB 750 mg/150 mL  Status:  Discontinued        750 mg 150 mL/hr over 60 Minutes Intravenous Every 24 hours 08/08/23 0015 08/09/23 1047   08/07/23 2330  Vancomycin (VANCOCIN) 1,500 mg in sodium chloride 0.9 % 500 mL IVPB        1,500 mg 250 mL/hr over 120 Minutes Intravenous  Once 08/07/23 2240 08/08/23 0740   08/07/23 2300  piperacillin-tazobactam (ZOSYN) IVPB 3.375 g  Status:  Discontinued        3.375 g 12.5 mL/hr over 240 Minutes Intravenous Every 8 hours 08/07/23 2240  08/09/23 1050   08/07/23 1715  cefTRIAXone (ROCEPHIN) 1 g in sodium chloride 0.9 % 100 mL IVPB        1 g 200 mL/hr over 30 Minutes Intravenous  Once 08/07/23 1707 08/07/23 1848       Objective: Vitals:   08/25/23 0626 08/25/23 0843  BP: (!) 181/77 (!) 131/57  Pulse: (!) 56 (!) 51  Resp: 17 18  Temp: 98.7 F (37.1 C) 98 F (36.7 C)  SpO2: 97% 91%    Intake/Output Summary (Last 24 hours) at 08/25/2023 1052 Last data filed at 08/24/2023 2200 Gross per 24 hour  Intake 3 ml  Output 1400 ml  Net -1397 ml   Filed Weights   08/07/23 2223 08/14/23 0813  Weight: 64.9 kg 68 kg   Weight change:  Body mass index is 22.15 kg/m.   Physical Exam: General exam: Pleasant, elderly African-American male. Skin: No rashes, lesions or ulcers. HEENT: Atraumatic, normocephalic, no obvious bleeding Lungs: Clear to auscultation bilaterally,  CVS: S1, S2, no murmur GI/Abd: Soft, nontender, nondistended, bowel sound present.  Scrotal exam shows swelling of the penis and scrotum as well as decubitus changes in the scrotum CNS: Alert, awake, oriented x 3 today.  Not delirious Psychiatry: Sad affect today. Extremities: No pedal edema, no calf tenderness,   Data Review: I have personally reviewed the laboratory data and studies available.  F/u labs  Unresulted Labs (From admission, onward)     Start     Ordered   08/26/23 0500  Basic metabolic panel  Tomorrow morning,   R       Question:  Specimen collection method  Answer:  Lab=Lab collect   08/25/23 0840   08/26/23 0500  CBC with Differential/Platelet  Tomorrow morning,   R       Question:  Specimen  collection method  Answer:  Lab=Lab collect   08/25/23 0840   08/14/23 0500  Creatinine, serum  (enoxaparin (LOVENOX)    CrCl >/= 30 ml/min)  Weekly,   R     Comments: while on enoxaparin therapy    08/07/23 2044           Total time spent in review of labs and imaging, patient evaluation, formulation of plan, documentation and  communication with family: 45 minutes  Signed, Lorin Glass, MD [facility] 08/25/2023

## 2023-08-25 NOTE — Consult Note (Addendum)
WOC Nurse Consult Note: Reason for Consult: Consult requested for scrotum. Generalized edema and painful to touch.  Patchy areas of partial thickness skin loss to posterior area are red and moist with small amt tan drainage. Affected area approx [id number].1cm Secure chat message sent to the primary team as follows, "I was consulted for this patient's scrotum. It is very swollen and painful; please order an ultrasound to rule out an abscess." Dressing procedure/placement/frequency: Topical treatment orders provided for bedside nurses to perform as follows to promote drying and healing: Apply Xeroform gauze to posterior scrotum Q day. Please re-consult if further assistance is needed.  Thank-you,  Cammie Mcgee MSN, RN, CWOCN, [place], CNS [phone]

## 2023-08-25 NOTE — Progress Notes (Signed)
PT Cancellation Note  Patient Details Name: Waheed Cassell MRN: [id number] DOB: 1951/08/15   Cancelled Treatment:    Reason Eval/Treat Not Completed: (P) Patient declined, no reason specified (pt states "not right now". RN notified pt may want assist for scrotal edema/repositioning for pressure relief in about an hour, pt agreeable to consider.) Will continue efforts per PT plan of care as schedule permits.   Blease Capaldi M Hondo Nanda 08/25/2023, 4:20 PM

## 2023-08-26 DIAGNOSIS — J9601 Acute respiratory failure with hypoxia: Secondary | ICD-10-CM | POA: Diagnosis not present

## 2023-08-26 LAB — GLUCOSE, CAPILLARY
Glucose-Capillary: 148 mg/dL — ABNORMAL HIGH (ref 70–99)
Glucose-Capillary: 197 mg/dL — ABNORMAL HIGH (ref 70–99)
Glucose-Capillary: 182 mg/dL — ABNORMAL HIGH (ref 70–99)
Glucose-Capillary: 227 mg/dL — ABNORMAL HIGH (ref 70–99)

## 2023-08-26 LAB — CBC WITH DIFFERENTIAL/PLATELET
Abs Immature Granulocytes: 0.04 10*3/uL (ref 0.00–0.07)
Basophils Absolute: 0 10*3/uL (ref 0.0–0.1)
Basophils Relative: 0 %
Eosinophils Absolute: 0.2 10*3/uL (ref 0.0–0.5)
Eosinophils Relative: 3 %
HCT: 28.2 % — ABNORMAL LOW (ref 39.0–52.0)
Hemoglobin: 9 g/dL — ABNORMAL LOW (ref 13.0–17.0)
Immature Granulocytes: 1 %
Lymphocytes Relative: 13 %
Lymphs Abs: 1 10*3/uL (ref 0.7–4.0)
MCH: 29.7 pg (ref 26.0–34.0)
MCHC: 31.9 g/dL (ref 30.0–36.0)
MCV: 93.1 fL (ref 80.0–100.0)
Monocytes Absolute: 0.5 10*3/uL (ref 0.1–1.0)
Monocytes Relative: 6 %
Neutro Abs: 5.9 10*3/uL (ref 1.7–7.7)
Neutrophils Relative %: 77 %
Platelets: 235 10*3/uL (ref 150–400)
RBC: 3.03 MIL/uL — ABNORMAL LOW (ref 4.22–5.81)
RDW: 18.4 % — ABNORMAL HIGH (ref 11.5–15.5)
WBC: 7.7 10*3/uL (ref 4.0–10.5)
nRBC: 0 % (ref 0.0–0.2)

## 2023-08-26 LAB — BASIC METABOLIC PANEL
Anion gap: 7 (ref 5–15)
BUN: 35 mg/dL — ABNORMAL HIGH (ref 8–23)
CO2: 30 mmol/L (ref 22–32)
Calcium: 8.2 mg/dL — ABNORMAL LOW (ref 8.9–10.3)
Chloride: 101 mmol/L (ref 98–111)
Creatinine, Ser: 1.19 mg/dL (ref 0.61–1.24)
GFR, Estimated: >60 mL/min (ref >60)
Glucose, Bld: 178 mg/dL — ABNORMAL HIGH (ref 70–99)
Potassium: 4 mmol/L (ref 3.5–5.1)
Sodium: 138 mmol/L (ref 135–145)

## 2023-08-26 NOTE — Plan of Care (Signed)
  Problem: Education: Goal: Ability to describe self-care measures that may prevent or decrease complications (Diabetes Survival Skills Education) will improve Outcome: Progressing Goal: Individualized Educational Video(s) Outcome: Progressing   Problem: Coping: Goal: Ability to adjust to condition or change in health will improve Outcome: Progressing   Problem: Fluid Volume: Goal: Ability to maintain a balanced intake and output will improve Outcome: Progressing   Problem: Health Behavior/Discharge Planning: Goal: Ability to identify and utilize available resources and services will improve Outcome: Progressing Goal: Ability to manage health-related needs will improve Outcome: Progressing   Problem: Metabolic: Goal: Ability to maintain appropriate glucose levels will improve Outcome: Progressing   Problem: Nutritional: Goal: Maintenance of adequate nutrition will improve Outcome: Progressing Goal: Progress toward achieving an optimal weight will improve Outcome: Progressing   Problem: Skin Integrity: Goal: Risk for impaired skin integrity will decrease Outcome: Progressing   Problem: Tissue Perfusion: Goal: Adequacy of tissue perfusion will improve Outcome: Progressing   Problem: Education: Goal: Knowledge of General Education information will improve Description: Including pain rating scale, medication(s)/side effects and non-pharmacologic comfort measures Outcome: Progressing   Problem: Health Behavior/Discharge Planning: Goal: Ability to manage health-related needs will improve Outcome: Progressing   Problem: Clinical Measurements: Goal: Ability to maintain clinical measurements within normal limits will improve Outcome: Progressing Goal: Will remain free from infection Outcome: Progressing Goal: Diagnostic test results will improve Outcome: Progressing Goal: Respiratory complications will improve Outcome: Progressing Goal: Cardiovascular complication will  be avoided Outcome: Progressing   Problem: Activity: Goal: Risk for activity intolerance will decrease Outcome: Progressing   Problem: Nutrition: Goal: Adequate nutrition will be maintained Outcome: Progressing   Problem: Coping: Goal: Level of anxiety will decrease Outcome: Progressing   Problem: Elimination: Goal: Will not experience complications related to bowel motility Outcome: Progressing Goal: Will not experience complications related to urinary retention Outcome: Progressing   Problem: Pain Management: Goal: General experience of comfort will improve Outcome: Progressing   Problem: Safety: Goal: Ability to remain free from injury will improve Outcome: Progressing   Problem: Skin Integrity: Goal: Risk for impaired skin integrity will decrease Outcome: Progressing   Problem: Education: Goal: Knowledge of the prescribed therapeutic regimen will improve Outcome: Progressing Goal: Ability to verbalize activity precautions or restrictions will improve Outcome: Progressing Goal: Understanding of discharge needs will improve Outcome: Progressing   Problem: Activity: Goal: Ability to perform//tolerate increased activity and mobilize with assistive devices will improve Outcome: Progressing   Problem: Clinical Measurements: Goal: Postoperative complications will be avoided or minimized Outcome: Progressing   Problem: Self-Care: Goal: Ability to meet self-care needs will improve Outcome: Progressing   Problem: Self-Concept: Goal: Ability to maintain and perform role responsibilities to the fullest extent possible will improve Outcome: Progressing   Problem: Pain Management: Goal: Pain level will decrease with appropriate interventions Outcome: Progressing   Problem: Skin Integrity: Goal: Demonstration of wound healing without infection will improve Outcome: Progressing

## 2023-08-26 NOTE — Progress Notes (Signed)
PROGRESS NOTE  Earl Calderon  DOB: [birth year]  PCP: Melvenia Beam, MD UXL:[id number]  DOA: 08/07/2023  LOS: 19 days  Hospital Day: 20  Brief narrative: Earl Calderon is a 72 y.o. male with PMH significant for DM2, HTN, HLD, CAD/PCI, combined systolic and diastolic CHF, CKD, MGUS 12/6, patient presented to the ED with complaint of shortness of breath  At baseline, patient is able to ambulate independently.  For 2 weeks prior to presentation, patient had progressive shortness of breath and ultimately became short of breath even at rest.  Also reported associated anterior chest discomfort, worsening bilateral chronic lower extremity wounds. On exam, he was noted to have worsening lower extremity wounds. On workup, CTA chest did not show any evidence of pulm embolism Admitted to [facility] with acute respiratory failure with hypoxia secondary to CHF exacerbation as well as lower extremity wounds  12/9, underwent aortogram with bilateral lower extremity angiogram 12/13, underwent bilateral BKA See below for details  Subjective: Patient was seen and examined this morning. Lying on bed. Scrotal area examined.  No open wound.  Lots of skin changes on dependent position No abscess on ultrasound yesterday.  Assessment and plan: Acute exacerbation of chronic combined systolic and diastolic CHF Essential hypertension Presented with shortness of breath, hypoxia on ambulation Previous echo with EF 30 to 35% Repeat echo 12/10 showed improvement with EF 55 to 60%, no WMA, moderate concentric LVH, grade 2 diastolic dysfunction, severely dilated LA Currently on carvedilol 3.125 mg twice daily, Imdur 20 mg twice daily, Entresto 97/103 mg twice daily  Heart rate in 50s consistently. Blood pressure gradually improving. Continue to monitor blood pressure and renal function.  Scrotal swelling and decubitus injury On my exam, he has gross swelling of the scrotum and penis related to  CHF. The scrotum has decubitus changes.  No open wound.  No active bleeding. 12/24, ultrasound did not show any evidence of abscess. Seen by wound care.  Local nursing care recommended.  Acute respiratory failure with hypoxia Initially hypoxic on minimal movement Currently not requiring oxygen at rest  PAD s/p arteriogram 12/9 both feet gangrene S/p bilateral BKA -12/13 Dr. Lajoyce Corners Currently on aspirin 81 mg daily Pain control oxycodone as needed, tramadol as needed  AKI on CKD 3a Baseline creatinine less than 1.3.  Patient's creatinine worsened to peak at 6.29.  Nephrology was consulted.  AKI was likely due to contrast exposure as well as diuretics. Creatinine has ultimately improved back to baseline.  Continue to monitor Recent Labs    08/12/23 0334 08/13/23 0250 08/14/23 0344 08/16/23 0352 08/17/23 0321 08/19/23 1229 08/21/23 0820 08/22/23 1022 08/23/23 0607 08/24/23 0546 08/26/23 0536  BUN 48* 48* 40* 41* 32* 40*  --  42* 44* 47* 35*  CREATININE 6.29* 5.28* 3.40* 2.17* 1.49* 1.52* 1.38* 1.19 1.39* 1.24 1.19   Acute blood loss anemia Multifactorial-mostly secondary to surgical blood loss. IV iron was avoided due to extremity wounds. Received total of 5 units of PRBC this hospitalization, last transfusion on 12/22 Hemoglobin stable for last 3 days overnight. Continue oral iron supplement Recent Labs    08/08/23 1823 08/09/23 0701 08/19/23 1822 08/22/23 1022 08/23/23 0607 08/23/23 2008 08/24/23 0546 08/26/23 0536  HGB 9.9*   < >  --  6.7* 6.8* 9.3* 9.1* 9.0*  MCV 93.5   < >  --  97.7 99.1 91.8 90.9 93.1  VITAMINB12 3,571*  --   --   --   --   --   --   --  FOLATE 12.5  --   --   --   --   --   --   --   TIBC  --   --  239*  --   --   --   --   --   IRON  --   --  49  --   --   --   --   --    < > = values in this interval not displayed.   Acute metabolic encephalopathy Likely has underlying dementia CT head without acute abnormality. Currently on Seroquel  25 mg mg/50 mg mg -am/pm  Type 2 diabetes mellitus uncontrolled Hypoglycemia A1c 9.1 on 08/07/2023 Currently on Levemir 8 units twice daily, continue SSI/Accu-Cheks Recent Labs  Lab 08/25/23 0702 08/25/23 1320 08/25/23 1641 08/25/23 2101 08/26/23 0834  GLUCAP 134* 141* 77 203* 148*   MGUS Outpatient follow-up with Dr. [name], last seen on 12/05/2022  Mobility: Bedbound status after bilateral lower extremity amputation  Goals of care   Code Status: Full Code     DVT prophylaxis:  enoxaparin (LOVENOX) injection 40 mg Start: 08/19/23 1000   Antimicrobials: None currently Fluid: None Consultants: Orthopedics Family Communication: None at bedside  Status: Inpatient Level of care:  Telemetry Medical   Patient is from: Home Needs to continue in-hospital care: Ongoing assessment of CHF meds.  Pending insurance approval for SNF   Diet:  Diet Order             Diet - low sodium heart healthy           Diet Carb Modified Fluid consistency: Thin; Room service appropriate? Yes  Diet effective now                   Scheduled Meds:  vitamin C  1,000 mg Oral Daily   aspirin EC  81 mg Oral Daily   carvedilol  3.125 mg Oral BID WC   enoxaparin (LOVENOX) injection  40 mg Subcutaneous Q24H   insulin aspart  0-6 Units Subcutaneous TID WC   insulin detemir  8 Units Subcutaneous BID   iron polysaccharides  150 mg Oral Daily   isosorbide dinitrate  20 mg Oral BID   lidocaine  1 patch Transdermal Q24H   nutrition supplement (JUVEN)  1 packet Oral BID BM   QUEtiapine  25 mg Oral Q breakfast   QUEtiapine  50 mg Oral QPM   sacubitril-valsartan  1 tablet Oral BID   senna-docusate  1 tablet Oral BID   sodium chloride flush  3 mL Intravenous Q12H   zinc sulfate (50mg  elemental zinc)  220 mg Oral Daily    PRN meds: acetaminophen **OR** acetaminophen, labetalol, LORazepam **OR** LORazepam, ondansetron (ZOFRAN) IV, oxyCODONE, polyethylene glycol   Infusions:     Antimicrobials: Anti-infectives (From admission, onward)    Start     Dose/Rate Route Frequency Ordered Stop   08/14/23 1015  vancomycin (VANCOCIN) powder  Status:  Discontinued          As needed 08/14/23 1015 08/14/23 1035   08/14/23 0815  ceFAZolin (ANCEF) IVPB 2g/100 mL premix        2 g 200 mL/hr over 30 Minutes Intravenous On call to O.R. 08/14/23 0729 08/14/23 1012   08/11/23 0800  cefTRIAXone (ROCEPHIN) 1 g in sodium chloride 0.9 % 100 mL IVPB        1 g 200 mL/hr over 30 Minutes Intravenous Daily 08/11/23 0559 08/15/23 2359   08/10/23 2200  vancomycin (  VANCOCIN) IVPB 1000 mg/200 mL premix  Status:  Discontinued        1,000 mg 200 mL/hr over 60 Minutes Intravenous Every 48 hours 08/09/23 1047 08/10/23 1206   08/10/23 1206  vancomycin variable dose per unstable renal function (pharmacist dosing)  Status:  Discontinued         Does not apply See admin instructions 08/10/23 1206 08/10/23 1218   08/10/23 0600  cefUROXime (ZINACEF) 1.5 g in sodium chloride 0.9 % 100 mL IVPB        1.5 g 200 mL/hr over 30 Minutes Intravenous On call to O.R. 08/09/23 0846 08/11/23 0559   08/09/23 1400  piperacillin-tazobactam (ZOSYN) IVPB 2.25 g  Status:  Discontinued        2.25 g 100 mL/hr over 30 Minutes Intravenous Every 8 hours 08/09/23 1050 08/11/23 0557   08/08/23 2200  vancomycin (VANCOREADY) IVPB 750 mg/150 mL  Status:  Discontinued        750 mg 150 mL/hr over 60 Minutes Intravenous Every 24 hours 08/08/23 0015 08/09/23 1047   08/07/23 2330  Vancomycin (VANCOCIN) 1,500 mg in sodium chloride 0.9 % 500 mL IVPB        1,500 mg 250 mL/hr over 120 Minutes Intravenous  Once 08/07/23 2240 08/08/23 0740   08/07/23 2300  piperacillin-tazobactam (ZOSYN) IVPB 3.375 g  Status:  Discontinued        3.375 g 12.5 mL/hr over 240 Minutes Intravenous Every 8 hours 08/07/23 2240 08/09/23 1050   08/07/23 1715  cefTRIAXone (ROCEPHIN) 1 g in sodium chloride 0.9 % 100 mL IVPB        1 g 200 mL/hr over 30  Minutes Intravenous  Once 08/07/23 1707 08/07/23 1848       Objective: Vitals:   08/26/23 0733 08/26/23 0916  BP: (!) 162/74 (!) 162/74  Pulse: (!) 56 (!) 56  Resp:    Temp: 97.8 F (36.6 C)   SpO2: 99%     Intake/Output Summary (Last 24 hours) at 08/26/2023 1059 Last data filed at 08/26/2023 0916 Gross per 24 hour  Intake 763 ml  Output 1500 ml  Net -737 ml   Filed Weights   08/07/23 2223 08/14/23 0813  Weight: 64.9 kg 68 kg   Weight change:  Body mass index is 22.15 kg/m.   Physical Exam: General exam: Pleasant, elderly African-American male. Skin: No rashes, lesions or ulcers. HEENT: Atraumatic, normocephalic, no obvious bleeding Lungs: Clear to auscultation bilaterally,  CVS: S1, S2, no murmur GI/Abd: Soft, nontender, nondistended, bowel sound present.  Scrotal exam shows swelling of the penis and scrotum as well as decubitus changes in the scrotum CNS: Alert, awake, oriented x 3 today.  Not delirious Psychiatry: Sad affect today. Extremities: No pedal edema, no calf tenderness  Data Review: I have personally reviewed the laboratory data and studies available.  F/u labs  Unresulted Labs (From admission, onward)     Start     Ordered   08/14/23 0500  Creatinine, serum  (enoxaparin (LOVENOX)    CrCl >/= 30 ml/min)  Weekly,   R     Comments: while on enoxaparin therapy    08/07/23 2044           Total time spent in review of labs and imaging, patient evaluation, formulation of plan, documentation and communication with family: 45 minutes  Signed, Lorin Glass, MD [facility] 08/26/2023

## 2023-08-27 DIAGNOSIS — J9601 Acute respiratory failure with hypoxia: Secondary | ICD-10-CM | POA: Diagnosis not present

## 2023-08-27 LAB — BASIC METABOLIC PANEL
Anion gap: 8 (ref 5–15)
BUN: 34 mg/dL — ABNORMAL HIGH (ref 8–23)
CO2: 30 mmol/L (ref 22–32)
Calcium: 8.4 mg/dL — ABNORMAL LOW (ref 8.9–10.3)
Chloride: 103 mmol/L (ref 98–111)
Creatinine, Ser: 1.01 mg/dL (ref 0.61–1.24)
GFR, Estimated: >60 mL/min (ref >60)
Glucose, Bld: 134 mg/dL — ABNORMAL HIGH (ref 70–99)
Potassium: 3.7 mmol/L (ref 3.5–5.1)
Sodium: 141 mmol/L (ref 135–145)

## 2023-08-27 LAB — CBC WITH DIFFERENTIAL/PLATELET
Abs Immature Granulocytes: 0.03 10*3/uL (ref 0.00–0.07)
Basophils Absolute: 0 10*3/uL (ref 0.0–0.1)
Basophils Relative: 0 %
Eosinophils Absolute: 0.3 10*3/uL (ref 0.0–0.5)
Eosinophils Relative: 3 %
HCT: 31 % — ABNORMAL LOW (ref 39.0–52.0)
Hemoglobin: 9.8 g/dL — ABNORMAL LOW (ref 13.0–17.0)
Immature Granulocytes: 0 %
Lymphocytes Relative: 11 %
Lymphs Abs: 1 10*3/uL (ref 0.7–4.0)
MCH: 29.6 pg (ref 26.0–34.0)
MCHC: 31.6 g/dL (ref 30.0–36.0)
MCV: 93.7 fL (ref 80.0–100.0)
Monocytes Absolute: 0.6 10*3/uL (ref 0.1–1.0)
Monocytes Relative: 6 %
Neutro Abs: 7.3 10*3/uL (ref 1.7–7.7)
Neutrophils Relative %: 80 %
Platelets: 255 10*3/uL (ref 150–400)
RBC: 3.31 MIL/uL — ABNORMAL LOW (ref 4.22–5.81)
RDW: 17.8 % — ABNORMAL HIGH (ref 11.5–15.5)
WBC: 9.3 10*3/uL (ref 4.0–10.5)
nRBC: 0 % (ref 0.0–0.2)

## 2023-08-27 LAB — GLUCOSE, CAPILLARY
Glucose-Capillary: 108 mg/dL — ABNORMAL HIGH (ref 70–99)
Glucose-Capillary: 98 mg/dL (ref 70–99)
Glucose-Capillary: 106 mg/dL — ABNORMAL HIGH (ref 70–99)
Glucose-Capillary: 104 mg/dL — ABNORMAL HIGH (ref 70–99)

## 2023-08-27 MED ORDER — FUROSEMIDE 10 MG/ML IJ SOLN
40.0000 mg | Freq: Once | INTRAMUSCULAR | Status: AC
  Administered 2023-08-27: 40 mg via INTRAVENOUS
  Filled 2023-08-27: qty 4

## 2023-08-27 NOTE — Progress Notes (Signed)
PROGRESS NOTE  Earl Calderon  DOB: July 06, 1951  PCP: Melvenia Beam, MD ZOX:[id number]  DOA: 08/07/2023  LOS: 20 days  Hospital Day: 21  Brief narrative: Earl Calderon is a 72 y.o. male with PMH significant for DM2, HTN, HLD, CAD/PCI, combined systolic and diastolic CHF, CKD, MGUS 12/6, patient presented to the ED with complaint of shortness of breath  At baseline, patient is able to ambulate independently.  For 2 weeks prior to presentation, patient had progressive shortness of breath and ultimately became short of breath even at rest.  Also reported associated anterior chest discomfort, worsening bilateral chronic lower extremity wounds. On exam, he was noted to have worsening lower extremity wounds. On workup, CTA chest did not show any evidence of pulm embolism Admitted to [facility] with acute respiratory failure with hypoxia secondary to CHF exacerbation as well as lower extremity wounds  12/9, underwent aortogram with bilateral lower extremity angiogram 12/13, underwent bilateral BKA See below for details  Subjective: Patient was seen and examined this morning. Lying on bed. Feels better after scrotal area was dressed by RN yesterday Mental status stable.  TeleSitter discontinued this morning  Assessment and plan: Acute exacerbation of chronic combined systolic and diastolic CHF Essential hypertension Presented with shortness of breath, hypoxia on ambulation Previous echo with EF 30 to 35% Repeat echo 12/10 showed improvement with EF 55 to 60%, no WMA, moderate concentric LVH, grade 2 diastolic dysfunction, severely dilated LA Currently on carvedilol 3.125 mg twice daily, Imdur 20 mg twice daily, Entresto 97/103 mg twice daily  Heart rate in 50s consistently. Blood pressure remains elevated Given swollen scrotum, I would resume Lasix.  Start with IV Lasix 40 mg today.  Continue to monitor blood pressure and renal function.  Scrotal swelling and decubitus  injury On my exam, he has gross swelling of the scrotum and penis related to CHF. The scrotum has decubitus changes.  No open wound.  No active bleeding. 12/24, ultrasound did not show any evidence of abscess. Seen by wound care.  Local nursing care recommended. Patient feels better after scrotal area was dressed yesterday.  Acute respiratory failure with hypoxia Initially hypoxic on minimal movement Currently not requiring oxygen at rest  PAD s/p arteriogram 12/9 both feet gangrene S/p bilateral BKA -12/13 Dr. Lajoyce Corners Currently on aspirin 81 mg daily Pain control oxycodone as needed, tramadol as needed  AKI on CKD 3a Baseline creatinine less than 1.3.  Patient's creatinine worsened to peak at 6.29.  Nephrology was consulted.  AKI was likely due to contrast exposure as well as diuretics. Creatinine has ultimately improved back to baseline.  Continue to monitor Recent Labs    08/13/23 0250 08/14/23 0344 08/16/23 0352 08/17/23 0321 08/19/23 1229 08/21/23 0820 08/22/23 1022 08/23/23 0607 08/24/23 0546 08/26/23 0536 08/27/23 0523  BUN 48* 40* 41* 32* 40*  --  42* 44* 47* 35* 34*  CREATININE 5.28* 3.40* 2.17* 1.49* 1.52* 1.38* 1.19 1.39* 1.24 1.19 1.01   Acute blood loss anemia Multifactorial-mostly secondary to surgical blood loss. IV iron was avoided due to extremity wounds. Received total of 5 units of PRBC this hospitalization, last transfusion on 12/22 Hemoglobin stable for last 3 days overnight. Continue oral iron supplement Recent Labs    08/08/23 1823 08/09/23 0701 08/19/23 1822 08/22/23 1022 08/23/23 0607 08/23/23 2008 08/24/23 0546 08/26/23 0536 08/27/23 0523  HGB 9.9*   < >  --    < > 6.8* 9.3* 9.1* 9.0* 9.8*  MCV 93.5   < >  --    < >  99.1 91.8 90.9 93.1 93.7  VITAMINB12 3,571*  --   --   --   --   --   --   --   --   FOLATE 12.5  --   --   --   --   --   --   --   --   TIBC  --   --  239*  --   --   --   --   --   --   IRON  --   --  49  --   --   --    --   --   --    < > = values in this interval not displayed.   Acute metabolic encephalopathy Likely has underlying dementia CT head without acute abnormality. Currently on Seroquel 25 mg mg/50 mg mg -am/pm  Type 2 diabetes mellitus uncontrolled Hypoglycemia A1c 9.1 on 08/07/2023 Currently on Levemir 8 units twice daily, continue SSI/Accu-Cheks Recent Labs  Lab 08/26/23 0834 08/26/23 1116 08/26/23 1602 08/26/23 2021 08/27/23 0612  GLUCAP 148* 197* 182* 227* 108*   MGUS Outpatient follow-up with Dr. [name], last seen on 12/05/2022  Mobility: Bedbound status after bilateral lower extremity amputation  Goals of care   Code Status: Full Code     DVT prophylaxis:  enoxaparin (LOVENOX) injection 40 mg Start: 08/19/23 1000   Antimicrobials: None currently Fluid: None Consultants: Orthopedics Family Communication: None at bedside  Status: Inpatient Level of care:  Telemetry Medical   Patient is from: Home Needs to continue in-hospital care: Ongoing adjustment of CHF meds. Pending insurance approval for SNF.   Diet:  Diet Order             Diet - low sodium heart healthy           Diet Carb Modified Fluid consistency: Thin; Room service appropriate? Yes  Diet effective now                   Scheduled Meds:  vitamin C  1,000 mg Oral Daily   aspirin EC  81 mg Oral Daily   carvedilol  3.125 mg Oral BID WC   enoxaparin (LOVENOX) injection  40 mg Subcutaneous Q24H   furosemide  40 mg Intravenous Once   insulin aspart  0-6 Units Subcutaneous TID WC   insulin detemir  8 Units Subcutaneous BID   iron polysaccharides  150 mg Oral Daily   isosorbide dinitrate  20 mg Oral BID   lidocaine  1 patch Transdermal Q24H   nutrition supplement (JUVEN)  1 packet Oral BID BM   QUEtiapine  25 mg Oral Q breakfast   QUEtiapine  50 mg Oral QPM   sacubitril-valsartan  1 tablet Oral BID   senna-docusate  1 tablet Oral BID   sodium chloride flush  3 mL Intravenous Q12H   zinc  sulfate (50mg  elemental zinc)  220 mg Oral Daily    PRN meds: acetaminophen **OR** acetaminophen, labetalol, LORazepam **OR** LORazepam, ondansetron (ZOFRAN) IV, oxyCODONE, polyethylene glycol   Infusions:    Antimicrobials: Anti-infectives (From admission, onward)    Start     Dose/Rate Route Frequency Ordered Stop   08/14/23 1015  vancomycin (VANCOCIN) powder  Status:  Discontinued          As needed 08/14/23 1015 08/14/23 1035   08/14/23 0815  ceFAZolin (ANCEF) IVPB 2g/100 mL premix        2 g 200 mL/hr over 30 Minutes Intravenous On call  to O.R. 08/14/23 0729 08/14/23 1012   08/11/23 0800  cefTRIAXone (ROCEPHIN) 1 g in sodium chloride 0.9 % 100 mL IVPB        1 g 200 mL/hr over 30 Minutes Intravenous Daily 08/11/23 0559 08/15/23 2359   08/10/23 2200  vancomycin (VANCOCIN) IVPB 1000 mg/200 mL premix  Status:  Discontinued        1,000 mg 200 mL/hr over 60 Minutes Intravenous Every 48 hours 08/09/23 1047 08/10/23 1206   08/10/23 1206  vancomycin variable dose per unstable renal function (pharmacist dosing)  Status:  Discontinued         Does not apply See admin instructions 08/10/23 1206 08/10/23 1218   08/10/23 0600  cefUROXime (ZINACEF) 1.5 g in sodium chloride 0.9 % 100 mL IVPB        1.5 g 200 mL/hr over 30 Minutes Intravenous On call to O.R. 08/09/23 0846 08/11/23 0559   08/09/23 1400  piperacillin-tazobactam (ZOSYN) IVPB 2.25 g  Status:  Discontinued        2.25 g 100 mL/hr over 30 Minutes Intravenous Every 8 hours 08/09/23 1050 08/11/23 0557   08/08/23 2200  vancomycin (VANCOREADY) IVPB 750 mg/150 mL  Status:  Discontinued        750 mg 150 mL/hr over 60 Minutes Intravenous Every 24 hours 08/08/23 0015 08/09/23 1047   08/07/23 2330  Vancomycin (VANCOCIN) 1,500 mg in sodium chloride 0.9 % 500 mL IVPB        1,500 mg 250 mL/hr over 120 Minutes Intravenous  Once 08/07/23 2240 08/08/23 0740   08/07/23 2300  piperacillin-tazobactam (ZOSYN) IVPB 3.375 g  Status:  Discontinued         3.375 g 12.5 mL/hr over 240 Minutes Intravenous Every 8 hours 08/07/23 2240 08/09/23 1050   08/07/23 1715  cefTRIAXone (ROCEPHIN) 1 g in sodium chloride 0.9 % 100 mL IVPB        1 g 200 mL/hr over 30 Minutes Intravenous  Once 08/07/23 1707 08/07/23 1848       Objective: Vitals:   08/27/23 0759 08/27/23 0858  BP: (!) 208/85 (!) 179/71  Pulse: 60   Resp: 18   Temp:    SpO2: 92%     Intake/Output Summary (Last 24 hours) at 08/27/2023 1056 Last data filed at 08/26/2023 2024 Gross per 24 hour  Intake 240 ml  Output 350 ml  Net -110 ml   Filed Weights   08/07/23 2223 08/14/23 0813  Weight: 64.9 kg 68 kg   Weight change:  Body mass index is 22.15 kg/m.   Physical Exam: General exam: Pleasant, elderly African-American male. Skin: No rashes, lesions or ulcers. HEENT: Atraumatic, normocephalic, no obvious bleeding Lungs: Clear to auscultation bilaterally,  CVS: S1, S2, no murmur GI/Abd: Soft, nontender, nondistended, bowel sound present.  Scrotal exam shows swelling of the penis and scrotum as well as decubitus changes in the scrotum CNS: Alert, awake, oriented x 3 today.  Not delirious Psychiatry: Mood appropriate Extremities: No pedal edema, no calf tenderness.  Data Review: I have personally reviewed the laboratory data and studies available.  F/u labs  Unresulted Labs (From admission, onward)     Start     Ordered   08/14/23 0500  Creatinine, serum  (enoxaparin (LOVENOX)    CrCl >/= 30 ml/min)  Weekly,   R     Comments: while on enoxaparin therapy    08/07/23 2044           Total time spent in review  of labs and imaging, patient evaluation, formulation of plan, documentation and communication with family: 45 minutes  Signed, Lorin Glass, MD [facility] 08/27/2023

## 2023-08-27 NOTE — Progress Notes (Signed)
Physical Therapy Treatment Patient Details Name: Earl Calderon MRN: [id number] DOB: 05/30/1951 Today's Date: 08/27/2023   History of Present Illness Patient is a 72 yo male presenting to the ED with SOB and fatigue on 08/07/23. Admitted with acute respiratory failure with hypoxia. Found to have bilateral lower extremity wounds, gangrene, chronic limb ischemia. Arteriogram performed on 12/9. 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  PMH includes: CAD, CKD stage III, DM type II, uncontrolled, MGUS, chronic systolic CHF, NSVT, resistant hypertension, hyperlipidemia.    PT Comments  Pt in bed upon arrival and agreeable to PT session despite feeling fatigued. Worked on posterior scooting and UE strength in today's session. Pt required MinAx2 to perform posterior scoots into the recliner. Pt did not want physical assistance while scooting and preferred to lean laterally and pull with his UE on the recliner arm rails. Pt did not want cueing stating "I want to do it myself". Pt is progressing towards goals. Acute PT to follow.      If plan is discharge home, recommend the following: Two people to help with walking and/or transfers;Assistance with cooking/housework;Assist for transportation;Help with stairs or ramp for entrance;Supervision due to cognitive status;Two people to help with bathing/dressing/bathroom   Can travel by private vehicle     No  Equipment Recommendations  Wheelchair (measurements PT);Wheelchair cushion (measurements PT);Hoyer lift;Other (comment);Hospital bed (ramp, drop arm BSC, slide board)       Precautions / Restrictions Precautions Precautions: Fall Precaution Comments: significant scrotal/bottom discomfort Restrictions Weight Bearing Restrictions Per Provider Order: Yes RLE Weight Bearing Per Provider Order: Non weight bearing LLE Weight Bearing Per Provider Order: Non weight bearing     Mobility  Bed Mobility Overal bed mobility: Needs Assistance Bed Mobility: Supine  to Sit     Supine to sit: Mod assist, +2 for safety/equipment, HOB elevated, Used rails     General bed mobility comments: used bed rails to move into long sitting, ModAx2 for safety for trunk elevation    Transfers Overall transfer level: Needs assistance Equipment used: None (bed pad) Transfers: Bed to chair/wheelchair/BSC      Anterior-Posterior transfers: +2 physical assistance, From elevated surface, Min assist   General transfer comment: MinAx2 for posterior scoot to recliner. Pt did not want physical assist and instead insisted on leaning posteriorly towards the recliner, and then leaning to off load hips. Pt allowed assist for trunk elevation when he felt stuck, otherwise, pt shifted left/right until his bottom was in the recliner    Ambulation/Gait  General Gait Details: not applicable       Balance Overall balance assessment: Needs assistance Sitting-balance support: Bilateral upper extremity supported, Feet unsupported Sitting balance-Leahy Scale: Fair   Postural control: Posterior lean     Standing balance comment: Not applicable     Cognition Arousal: Alert Behavior During Therapy: WFL for tasks assessed/performed, Anxious Overall Cognitive Status: No family/caregiver present to determine baseline cognitive functioning    General Comments: anxious about mobility, did not want assist or cueing for transfers and prefered to perform posterior scoot in "his own way"           General Comments General comments (skin integrity, edema, etc.): VSS on RA, propped B LE's on pillow in the recliner for comfort. Pt insistent on performing as much mobility as independently as possible      Pertinent Vitals/Pain Pain Assessment Pain Assessment: Faces Faces Pain Scale: Hurts little more Pain Location: bottom and scrotal area with scooting Pain Descriptors / Indicators: Guarding, Grimacing, Discomfort,  Moaning, Sharp, Throbbing Pain Intervention(s): Limited  activity within patient's tolerance, Monitored during session, Repositioned     PT Goals (current goals can now be found in the care plan section) Acute Rehab PT Goals Patient Stated Goal: to go home and work toward bil prosthetic limbs/walking PT Goal Formulation: With patient Time For Goal Achievement: 08/29/23 Potential to Achieve Goals: Fair Progress towards PT goals: Progressing toward goals    Frequency    Min 1X/week       AM-PAC PT "6 Clicks" Mobility   Outcome Measure  Help needed turning from your back to your side while in a flat bed without using bedrails?: A Lot (without rail) Help needed moving from lying on your back to sitting on the side of a flat bed without using bedrails?: A Lot Help needed moving to and from a bed to a chair (including a wheelchair)?: Total (+2 assist) Help needed standing up from a chair using your arms (e.g., wheelchair or bedside chair)?: Total Help needed to walk in hospital room?: Total Help needed climbing 3-5 steps with a railing? : Total 6 Click Score: 8    End of Session Equipment Utilized During Treatment: Other (comment) (bed pad assist) Activity Tolerance: Patient tolerated treatment well Patient left: with call bell/phone within reach;in chair Nurse Communication: Mobility status;Need for lift equipment PT Visit Diagnosis: Other abnormalities of gait and mobility (R26.89)     Time: 8119-1478 PT Time Calculation (min) (ACUTE ONLY): 23 min  Charges:    $Therapeutic Exercise: 8-22 mins $Therapeutic Activity: 8-22 mins PT General Charges $$ ACUTE PT VISIT: 1 Visit                     Earl Calderon, PT, DPT Secure Chat Preferred  Rehab Office [phone]   Earl Calderon 08/27/2023, 1:24 PM

## 2023-08-27 NOTE — TOC Progression Note (Addendum)
Transition of Care [facility]) - Progression Note    Patient Details  Name: Earl Calderon MRN: [id number] Date of Birth: 09/08/1950  Transition of Care [facility]) CM/SW Contact  Lorri Frederick, LCSW Phone Number: 08/27/2023, 9:32 AM  Clinical Narrative:   CSW attempted to call [facility] to check on bed availability.  No answer.  0930: TC reception at [facility]: [place] with admissions not in currently but is expected today.  Left message for Chyna.   1315: second message left with Chyna/[place].   Expected Discharge Plan: Home w Home Health Services Barriers to Discharge: Continued Medical Work up  Expected Discharge Plan and Services In-house Referral: Clinical Social Work Discharge Planning Services: CM Consult                     DME Arranged: Other see comment (hoyer lift) DME Agency: [employer] Date DME Agency Contacted: 08/21/23 Time DME Agency Contacted: [phone] Representative spoke with at DME Agency: Ian Malkin HH Arranged: PT, OT HH Agency: [facility] Date [facility] Agency Contacted: 08/20/23 Time HH Agency Contacted: 1530 Representative spoke with at [facility] Agency: Amy   Social Determinants of Health (SDOH) Interventions SDOH Screenings   Food Insecurity: No Food Insecurity (08/07/2023)  Housing: Low Risk  (08/07/2023)  Transportation Needs: No Transportation Needs (08/07/2023)  Utilities: Not At Risk (08/07/2023)  Depression (PHQ2-9): Low Risk  (10/18/2020)  Financial Resource Strain: Medium Risk (09/21/2020)  Tobacco Use: Low Risk  (08/14/2023)    Readmission Risk Interventions     No data to display

## 2023-08-27 NOTE — Progress Notes (Signed)
Patient removed his male pur wick and when we asked what was the matter he requested that he be left alone he stated that he was getting agitated patient was given 1 mg of Ativan for agitation at 0050

## 2023-08-28 DIAGNOSIS — J9601 Acute respiratory failure with hypoxia: Secondary | ICD-10-CM | POA: Diagnosis not present

## 2023-08-28 LAB — GLUCOSE, CAPILLARY
Glucose-Capillary: 85 mg/dL (ref 70–99)
Glucose-Capillary: 132 mg/dL — ABNORMAL HIGH (ref 70–99)
Glucose-Capillary: 123 mg/dL — ABNORMAL HIGH (ref 70–99)
Glucose-Capillary: 228 mg/dL — ABNORMAL HIGH (ref 70–99)

## 2023-08-28 LAB — CREATININE, SERUM
Creatinine, Ser: 1.08 mg/dL (ref 0.61–1.24)
GFR, Estimated: >60 mL/min (ref >60)

## 2023-08-28 MED ORDER — SENNOSIDES-DOCUSATE SODIUM 8.6-50 MG PO TABS: 1.0000 | ORAL_TABLET | Freq: Every day | ORAL | Status: DC

## 2023-08-28 MED ORDER — FUROSEMIDE 10 MG/ML IJ SOLN
40.0000 mg | Freq: Once | INTRAMUSCULAR | Status: AC
  Administered 2023-08-28: 40 mg via INTRAVENOUS
  Filled 2023-08-28: qty 4

## 2023-08-28 MED ORDER — QUETIAPINE FUMARATE 25 MG PO TABS
25.0000 mg | ORAL_TABLET | Freq: Two times a day (BID) | ORAL | Status: DC
  Administered 2023-08-28 – 2023-09-01 (×7): 25 mg via ORAL
  Filled 2023-08-28 (×10): qty 1

## 2023-08-28 NOTE — TOC Progression Note (Addendum)
Transition of Care [facility]) - Progression Note    Patient Details  Name: Earl Calderon MRN: [id number] Date of Birth: Oct 03, 1950  Transition of Care [facility]) CM/SW Contact  Lorri Frederick, LCSW Phone Number: 08/28/2023, 10:28 AM  Clinical Narrative:   CSW spoke with [facility].  She reports that they are no longer accepting pt for STR.  CSW updated pt, who is requesting CSW speak with wife regarding new SNF choice.  CSW attempted to call why Sonya at all listed numbers, no answer, LM.  1115: TC [facility].  CSW updated her that [facility] now stating they cannot take pt for SNF.  Mrs Red[place] immediately upset, states that this is [place]'s fault, states [facility] waited too long, states "you all didn't want him to go to [facility]."  CSW attempted to provide information from prior notes, attempted to discuss other bed offers, but Mrs Revel yelling loudly at this point, states she is going to make a complaint and then hangs up.    CSW spoke with pt in room, discussed other bed offers, which were given to pt on medicare choice document.  Pt reports his wife is concerned that he would be sent to "the old folks home" and would not ever return home.  CSW explained that all facilities that we have sent his referral to would be for short term rehabilitation, not long term care.  Pt will review offers.   1500: CSW spoke with pt, somewhat lengthy conversation regarding bed offers.  Pt stating he that he is distressed, having strange sensations in his legs, worried about picking a SNF that is a "bad place."  Pt states his wife was in some facilities in the past that were very bad situations and he had to "rescue" her from them, they are both very worried about pt ending up in another bad situation.  CSW encouraged pt to look into his current options, discussed that wife can make visits if she would like to.  Pt will work on making a choice but does not want to make one now.    Expected  Discharge Plan: Home w Home Health Services Barriers to Discharge: Continued Medical Work up  Expected Discharge Plan and Services In-house Referral: Clinical Social Work Discharge Planning Services: CM Consult                     DME Arranged: Other see comment (hoyer lift) DME Agency: [employer] Date DME Agency Contacted: 08/21/23 Time DME Agency Contacted: [phone] Representative spoke with at DME Agency: Ian Malkin HH Arranged: PT, OT HH Agency: [facility] Date [facility] Agency Contacted: 08/20/23 Time HH Agency Contacted: 1530 Representative spoke with at [facility] Agency: Amy   Social Determinants of Health (SDOH) Interventions SDOH Screenings   Food Insecurity: No Food Insecurity (08/07/2023)  Housing: Low Risk  (08/07/2023)  Transportation Needs: No Transportation Needs (08/07/2023)  Utilities: Not At Risk (08/07/2023)  Depression (PHQ2-9): Low Risk  (10/18/2020)  Financial Resource Strain: Medium Risk (09/21/2020)  Tobacco Use: Low Risk  (08/14/2023)    Readmission Risk Interventions     No data to display

## 2023-08-28 NOTE — Progress Notes (Signed)
Occupational Therapy Treatment Patient Details Name: Earl Calderon MRN: [id number] DOB: Jan 06, 1951 Today's Date: 08/28/2023   History of present illness Patient is a 72 yo male presenting to the ED with SOB and fatigue on 08/07/23. Admitted with acute respiratory failure with hypoxia. Found to have bilateral lower extremity wounds, gangrene, chronic limb ischemia. Arteriogram performed on 12/9. 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  PMH includes: CAD, CKD stage III, DM type II, uncontrolled, MGUS, chronic systolic CHF, NSVT, resistant hypertension, hyperlipidemia.   OT comments  Patient making good gain with OT treatment. Patient required mod assist to get to long sitting and in position for A-P transfer with min assist for sitting balance but progressed to CGA. Patient required mod assist and increased time with use of bed pad to transfer to recliner. Patient assisted with positioning self in chair to address sacral pain. Patient instructed in BUE strengthening exercises to increase functional strength to assist with transfers. Patient will benefit from continued inpatient follow up therapy, <3 hours/day for continued OT treatment to address bed mobility, transfers, and self care. Acute OT to continue to follow.       If plan is discharge home, recommend the following:  A lot of help with walking and/or transfers;A lot of help with bathing/dressing/bathroom;Assistance with cooking/housework;Direct supervision/assist for medications management;Direct supervision/assist for financial management;Assist for transportation;Help with stairs or ramp for entrance;Supervision due to cognitive status;Two people to help with walking and/or transfers   Equipment Recommendations  Wheelchair cushion (measurements OT);Wheelchair (measurements OT);Other (comment) (TBD)    Recommendations for Other Services      Precautions / Restrictions Precautions Precautions: Fall Precaution Comments: significant  scrotal/bottom discomfort Restrictions Weight Bearing Restrictions Per Provider Order: Yes RLE Weight Bearing Per Provider Order: Non weight bearing LLE Weight Bearing Per Provider Order: Non weight bearing Other Position/Activity Restrictions: Bilateral BKA       Mobility Bed Mobility Overal bed mobility: Needs Assistance Bed Mobility: Supine to Sit     Supine to sit: Mod assist, HOB elevated, Used rails     General bed mobility comments: mod assist for long sitting and positioning self for A-P transfer    Transfers Overall transfer level: Needs assistance Equipment used: None (bed pad) Transfers: Bed to chair/wheelchair/BSC         Anterior-Posterior transfers: Mod assist, From elevated surface   General transfer comment: increased time due to positioning self due to pain and use of bed pad     Balance Overall balance assessment: Needs assistance Sitting-balance support: Bilateral upper extremity supported, Feet unsupported Sitting balance-Leahy Scale: Fair Sitting balance - Comments: min assist but progressed to CGA Postural control: Posterior lean     Standing balance comment: Not applicable                           ADL either performed or assessed with clinical judgement   ADL Overall ADL's : Needs assistance/impaired     Grooming: Set up;Bed level   Upper Body Bathing: Minimal assistance;Bed level   Lower Body Bathing: Maximal assistance;Total assistance;Sitting/lateral leans                              Extremity/Trunk Assessment              Vision       Perception     Praxis      Cognition Arousal: Alert Behavior During Therapy: [facility] for  tasks assessed/performed, Anxious Overall Cognitive Status: No family/caregiver present to determine baseline cognitive functioning                                 General Comments: nervous about going to SNF for rehab        Exercises Exercises: General  Upper Extremity General Exercises - Upper Extremity Shoulder Flexion: Strengthening, Both, 15 reps, Seated, Theraband Theraband Level (Shoulder Flexion): Level 3 (Green) Shoulder Horizontal ABduction: Strengthening, Both, 15 reps, Seated, Theraband Theraband Level (Shoulder Horizontal Abduction): Level 3 (Green) Elbow Flexion: Strengthening, Both, 15 reps, Seated, Theraband Theraband Level (Elbow Flexion): Level 3 (Green) Elbow Extension: Strengthening, Both, 15 reps, Seated, Theraband Theraband Level (Elbow Extension): Level 3 (Green)    Shoulder Instructions       General Comments      Pertinent Vitals/ Pain       Pain Assessment Pain Assessment: Faces Faces Pain Scale: Hurts little more Pain Location: bottom and scrotal area with scooting Pain Descriptors / Indicators: Guarding, Grimacing, Discomfort, Moaning, Sharp, Throbbing Pain Intervention(s): Limited activity within patient's tolerance, Monitored during session, Repositioned  Home Living                                          Prior Functioning/Environment              Frequency  Min 1X/week        Progress Toward Goals  OT Goals(current goals can now be found in the care plan section)  Progress towards OT goals: Progressing toward goals  Acute Rehab OT Goals Patient Stated Goal: be able to walk again OT Goal Formulation: With patient Time For Goal Achievement: 08/29/23 Potential to Achieve Goals: Fair ADL Goals Pt Will Perform Lower Body Bathing: with min assist;sitting/lateral leans Pt Will Perform Lower Body Dressing: with min assist;sitting/lateral leans Pt Will Transfer to Toilet: with mod assist;with transfer board;bedside commode Pt Will Perform Toileting - Clothing Manipulation and hygiene: with min assist;sitting/lateral leans Additional ADL Goal #1: Patient will complete bed mobility at CGA level as a precursor to OOB activities.  Plan      Co-evaluation                  AM-PAC OT "6 Clicks" Daily Activity     Outcome Measure   Help from another person eating meals?: None Help from another person taking care of personal grooming?: A Little Help from another person toileting, which includes using toliet, bedpan, or urinal?: A Lot Help from another person bathing (including washing, rinsing, drying)?: A Lot Help from another person to put on and taking off regular upper body clothing?: A Little Help from another person to put on and taking off regular lower body clothing?: A Lot 6 Click Score: 16    End of Session    OT Visit Diagnosis: Unsteadiness on feet (R26.81);Other abnormalities of gait and mobility (R26.89);Other symptoms and signs involving cognitive function   Activity Tolerance Patient tolerated treatment well   Patient Left in chair;with call bell/phone within reach;with chair alarm set   Nurse Communication Mobility status;Other (comment) (recommend A-P transfer back to bed)        Time: 1610-9604 OT Time Calculation (min): 32 min  Charges: OT General Charges $OT Visit: 1 Visit OT Treatments $Therapeutic Activity: 8-22 mins $Therapeutic Exercise:  8-22 mins  Alfonse Flavors, OTA Acute Rehabilitation Services  Office [phone]   Dewain Penning 08/28/2023, 2:01 PM

## 2023-08-28 NOTE — Progress Notes (Signed)
PROGRESS NOTE  Earl Calderon  DOB: 01-10-1951  PCP: Melvenia Beam, MD OZH:[id number]  DOA: 08/07/2023  LOS: 21 days  Hospital Day: 22  Brief narrative: Earl Calderon is a 72 y.o. male with PMH significant for DM2, HTN, HLD, CAD/PCI, combined systolic and diastolic CHF, CKD, MGUS 12/6, patient presented to the ED with complaint of shortness of breath  At baseline, patient is able to ambulate independently.  For 2 weeks prior to presentation, patient had progressive shortness of breath and ultimately became short of breath even at rest.  Also reported associated anterior chest discomfort, worsening bilateral chronic lower extremity wounds. On exam, he was noted to have worsening lower extremity wounds. On workup, CTA chest did not show any evidence of pulm embolism Admitted to [facility] with acute respiratory failure with hypoxia secondary to CHF exacerbation as well as lower extremity wounds  12/9, underwent aortogram with bilateral lower extremity angiogram 12/13, underwent bilateral BKA See below for details  Subjective: Patient was seen and examined this morning. Lying down in bed.  Not in distress.  Mental status intact.  Scrotal swelling still present.  1 dose of Lasix IV was given yesterday.  To be repeated today.  Assessment and plan: Acute exacerbation of chronic combined systolic and diastolic CHF Essential hypertension Presented with shortness of breath, hypoxia on ambulation Previous echo with EF 30 to 35% Repeat echo 12/10 showed improvement with EF 55 to 60%, no WMA, moderate concentric LVH, grade 2 diastolic dysfunction, severely dilated LA Currently on carvedilol 3.125 mg twice daily, Imdur 20 mg twice daily, Entresto 97/103 mg twice daily  Heart rate in 50s consistently. Blood pressure remains elevated Because of scrotal swelling, 1 dose of Lasix IV as needed yesterday.   Blood pressure and renal function are stable.  Repeat Lasix IV 40 mg today. Continue  to monitor blood pressure and renal function.  Scrotal swelling and decubitus injury On my exam, he has gross swelling of the scrotum and penis related to CHF. The scrotum has decubitus changes.  No open wound.  No active bleeding. 12/24, ultrasound did not show any evidence of abscess. Seen by wound care.  Local nursing care recommended. Patient feels better after scrotal area was dressed yesterday.  Acute respiratory failure with hypoxia Initially hypoxic on minimal movement Currently not requiring oxygen at rest  PAD s/p arteriogram 12/9 both feet gangrene S/p bilateral BKA -12/13 Dr. Lajoyce Corners Currently on aspirin 81 mg daily Pain control oxycodone as needed, tramadol as needed  AKI on CKD 3a Baseline creatinine less than 1.3.  Patient's creatinine worsened to peak at 6.29.  Nephrology was consulted.  AKI was likely due to contrast exposure as well as diuretics. Creatinine has ultimately improved back to baseline.  Continue to monitor Recent Labs    08/13/23 0250 08/14/23 0344 08/16/23 0352 08/17/23 0321 08/19/23 1229 08/21/23 0820 08/22/23 1022 08/23/23 0607 08/24/23 0546 08/26/23 0536 08/27/23 0523 08/28/23 0605  BUN 48* 40* 41* 32* 40*  --  42* 44* 47* 35* 34*  --   CREATININE 5.28* 3.40* 2.17* 1.49* 1.52* 1.38* 1.19 1.39* 1.24 1.19 1.01 1.08   Acute blood loss anemia Multifactorial-mostly secondary to surgical blood loss. IV iron was avoided due to extremity wounds. Received total of 5 units of PRBC this hospitalization, last transfusion on 12/22 Hemoglobin stable for last 3 days overnight. Continue oral iron supplement Recent Labs    08/08/23 1823 08/09/23 0701 08/19/23 1822 08/22/23 1022 08/23/23 0607 08/23/23 2008 08/24/23 0546 08/26/23 0536 08/27/23  0523  HGB 9.9*   < >  --    < > 6.8* 9.3* 9.1* 9.0* 9.8*  MCV 93.5   < >  --    < > 99.1 91.8 90.9 93.1 93.7  VITAMINB12 3,571*  --   --   --   --   --   --   --   --   FOLATE 12.5  --   --   --   --   --    --   --   --   TIBC  --   --  239*  --   --   --   --   --   --   IRON  --   --  49  --   --   --   --   --   --    < > = values in this interval not displayed.   Acute metabolic encephalopathy Likely has underlying dementia CT head without acute abnormality. Currently on Seroquel 25 mg mg/50 mg mg -am/pm Reduce p.m. dose to 25 mg.  Type 2 diabetes mellitus uncontrolled Hypoglycemia A1c 9.1 on 08/07/2023 Currently on Levemir 8 units twice daily, continue SSI/Accu-Cheks Recent Labs  Lab 08/27/23 1124 08/27/23 1618 08/27/23 2147 08/28/23 0958 08/28/23 1140  GLUCAP 98 106* 104* 85 132*   MGUS Outpatient follow-up with Dr. [name], last seen on 12/05/2022  Mobility: Bedbound status after bilateral lower extremity amputation  Goals of care   Code Status: Full Code     DVT prophylaxis:  enoxaparin (LOVENOX) injection 40 mg Start: 08/19/23 1000   Antimicrobials: None currently Fluid: None Consultants: Orthopedics Family Communication: None at bedside  Status: Inpatient Level of care:  Telemetry Medical   Patient is from: Home Needs to continue in-hospital care: Ongoing adjustment of CHF meds. Pending insurance approval for SNF.   Diet:  Diet Order             Diet - low sodium heart healthy           Diet Carb Modified Fluid consistency: Thin; Room service appropriate? Yes  Diet effective now                   Scheduled Meds:  vitamin C  1,000 mg Oral Daily   aspirin EC  81 mg Oral Daily   carvedilol  3.125 mg Oral BID WC   enoxaparin (LOVENOX) injection  40 mg Subcutaneous Q24H   insulin aspart  0-6 Units Subcutaneous TID WC   insulin detemir  8 Units Subcutaneous BID   iron polysaccharides  150 mg Oral Daily   isosorbide dinitrate  20 mg Oral BID   lidocaine  1 patch Transdermal Q24H   nutrition supplement (JUVEN)  1 packet Oral BID BM   QUEtiapine  25 mg Oral BID   sacubitril-valsartan  1 tablet Oral BID   [START ON 08/29/2023] senna-docusate  1  tablet Oral QHS   sodium chloride flush  3 mL Intravenous Q12H    PRN meds: acetaminophen **OR** acetaminophen, labetalol, LORazepam **OR** LORazepam, ondansetron (ZOFRAN) IV, oxyCODONE, polyethylene glycol   Infusions:    Antimicrobials: Anti-infectives (From admission, onward)    Start     Dose/Rate Route Frequency Ordered Stop   08/14/23 1015  vancomycin (VANCOCIN) powder  Status:  Discontinued          As needed 08/14/23 1015 08/14/23 1035   08/14/23 0815  ceFAZolin (ANCEF) IVPB 2g/100 mL premix  2 g 200 mL/hr over 30 Minutes Intravenous On call to O.R. 08/14/23 0729 08/14/23 1012   08/11/23 0800  cefTRIAXone (ROCEPHIN) 1 g in sodium chloride 0.9 % 100 mL IVPB        1 g 200 mL/hr over 30 Minutes Intravenous Daily 08/11/23 0559 08/15/23 2359   08/10/23 2200  vancomycin (VANCOCIN) IVPB 1000 mg/200 mL premix  Status:  Discontinued        1,000 mg 200 mL/hr over 60 Minutes Intravenous Every 48 hours 08/09/23 1047 08/10/23 1206   08/10/23 1206  vancomycin variable dose per unstable renal function (pharmacist dosing)  Status:  Discontinued         Does not apply See admin instructions 08/10/23 1206 08/10/23 1218   08/10/23 0600  cefUROXime (ZINACEF) 1.5 g in sodium chloride 0.9 % 100 mL IVPB        1.5 g 200 mL/hr over 30 Minutes Intravenous On call to O.R. 08/09/23 0846 08/11/23 0559   08/09/23 1400  piperacillin-tazobactam (ZOSYN) IVPB 2.25 g  Status:  Discontinued        2.25 g 100 mL/hr over 30 Minutes Intravenous Every 8 hours 08/09/23 1050 08/11/23 0557   08/08/23 2200  vancomycin (VANCOREADY) IVPB 750 mg/150 mL  Status:  Discontinued        750 mg 150 mL/hr over 60 Minutes Intravenous Every 24 hours 08/08/23 0015 08/09/23 1047   08/07/23 2330  Vancomycin (VANCOCIN) 1,500 mg in sodium chloride 0.9 % 500 mL IVPB        1,500 mg 250 mL/hr over 120 Minutes Intravenous  Once 08/07/23 2240 08/08/23 0740   08/07/23 2300  piperacillin-tazobactam (ZOSYN) IVPB 3.375 g   Status:  Discontinued        3.375 g 12.5 mL/hr over 240 Minutes Intravenous Every 8 hours 08/07/23 2240 08/09/23 1050   08/07/23 1715  cefTRIAXone (ROCEPHIN) 1 g in sodium chloride 0.9 % 100 mL IVPB        1 g 200 mL/hr over 30 Minutes Intravenous  Once 08/07/23 1707 08/07/23 1848       Objective: Vitals:   08/28/23 0531 08/28/23 1005  BP: (!) 156/65 (!) 160/77  Pulse: (!) 53 (!) 57  Resp: 14 18  Temp: 98.3 F (36.8 C) 98 F (36.7 C)  SpO2: 94% 96%    Intake/Output Summary (Last 24 hours) at 08/28/2023 1151 Last data filed at 08/28/2023 0840 Gross per 24 hour  Intake 477 ml  Output 900 ml  Net -423 ml   Filed Weights   08/07/23 2223 08/14/23 0813  Weight: 64.9 kg 68 kg   Weight change:  Body mass index is 22.15 kg/m.   Physical Exam: General exam: Pleasant, elderly African-American male. Skin: No rashes, lesions or ulcers. HEENT: Atraumatic, normocephalic, no obvious bleeding Lungs: Clear to auscultation bilaterally,  CVS: S1, S2, no murmur GI/Abd: Soft, nontender, nondistended, bowel sound present.  Scrotal exam shows swelling of the penis and scrotum as well as decubitus changes in the scrotum CNS: Alert, awake, oriented x 3 today.  Not delirious Psychiatry: Mood appropriate Extremities: No pedal edema, no calf tenderness.  Data Review: I have personally reviewed the laboratory data and studies available.  F/u labs  Unresulted Labs (From admission, onward)     Start     Ordered   08/14/23 0500  Creatinine, serum  (enoxaparin (LOVENOX)    CrCl >/= 30 ml/min)  Weekly,   R     Comments: while on enoxaparin therapy  08/07/23 2044           Total time spent in review of labs and imaging, patient evaluation, formulation of plan, documentation and communication with family: 45 minutes  Signed, Lorin Glass, MD [facility] 08/28/2023

## 2023-08-28 NOTE — Progress Notes (Signed)
Pt. Refuse to transfer to bed X 3, "states he feel less discomfort there and will like to stay"

## 2023-08-29 DIAGNOSIS — J9601 Acute respiratory failure with hypoxia: Secondary | ICD-10-CM | POA: Diagnosis not present

## 2023-08-29 LAB — GLUCOSE, CAPILLARY
Glucose-Capillary: 115 mg/dL — ABNORMAL HIGH (ref 70–99)
Glucose-Capillary: 237 mg/dL — ABNORMAL HIGH (ref 70–99)
Glucose-Capillary: 202 mg/dL — ABNORMAL HIGH (ref 70–99)
Glucose-Capillary: 101 mg/dL — ABNORMAL HIGH (ref 70–99)

## 2023-08-29 MED ORDER — FLEET ENEMA RE ENEM
1.0000 | ENEMA | Freq: Once | RECTAL | Status: AC
  Administered 2023-08-29: 1 via RECTAL
  Filled 2023-08-29: qty 1

## 2023-08-29 MED ORDER — FUROSEMIDE 10 MG/ML IJ SOLN
40.0000 mg | Freq: Once | INTRAMUSCULAR | Status: AC
  Administered 2023-08-29: 40 mg via INTRAVENOUS
  Filled 2023-08-29: qty 4

## 2023-08-29 NOTE — Progress Notes (Signed)
PROGRESS NOTE  Earl Calderon  DOB: 05/30/1951  PCP: Melvenia Beam, MD ZOX:[id number]  DOA: 08/07/2023  LOS: 22 days  Hospital Day: 23  Brief narrative: Earl Calderon is a 72 y.o. male with PMH significant for DM2, HTN, HLD, CAD/PCI, combined systolic and diastolic CHF, CKD, MGUS 12/6, patient presented to the ED with complaint of shortness of breath  At baseline, patient is able to ambulate independently.  For 2 weeks prior to presentation, patient had progressive shortness of breath and ultimately became short of breath even at rest.  Also reported associated anterior chest discomfort, worsening bilateral chronic lower extremity wounds. On exam, he was noted to have worsening lower extremity wounds. On workup, CTA chest did not show any evidence of pulm embolism Admitted to [facility] with acute respiratory failure with hypoxia secondary to CHF exacerbation as well as lower extremity wounds  12/9, underwent aortogram with bilateral lower extremity angiogram 12/13, underwent bilateral BKA See below for details  Subjective: Patient was seen and examined this morning. Lying down in bed.  States he is constantly feeling like he needs to have a bowel movement but not getting anything out.  Mental status intact.  Scrotal swelling still present.  1 dose of Lasix IV was given yesterday.  To be repeated today. Blood pressure seems to to run elevated in the morning prior to meds.  Continue to trend  Assessment and plan: Acute exacerbation of chronic combined systolic and diastolic CHF Essential hypertension Presented with shortness of breath, hypoxia on ambulation Previous echo with EF 30 to 35% Repeat echo 12/10 showed improvement with EF 55 to 60%, no WMA, moderate concentric LVH, grade 2 diastolic dysfunction, severely dilated LA Currently on carvedilol 3.125 mg twice daily, Imdur 20 mg twice daily, Entresto 97/103 mg twice daily  Heart rate in 50s consistently.  Because of  scrotal swelling, patient is on IV Lasix daily since 12/26. Blood pressure seems to to run elevated in the morning prior to meds.  Continue to trend Renal function is stable.  Repeat Lasix IV 40 mg today as well. Continue to monitor blood pressure and renal function.  Scrotal swelling and decubitus injury On my exam, he has gross swelling of the scrotum and penis related to CHF. The scrotum has decubitus changes.  No open wound.  No active bleeding. 12/24, ultrasound did not show any evidence of abscess. Seen by wound care.  Local nursing care recommended. Patient feels better after scrotal area was dressed yesterday.  Acute respiratory failure with hypoxia Initially hypoxic on minimal movement Currently not requiring oxygen at rest  PAD s/p arteriogram 12/9 both feet gangrene S/p bilateral BKA -12/13 Dr. Lajoyce Corners Currently on aspirin 81 mg daily Pain control oxycodone as needed, tramadol as needed  AKI on CKD 3a Baseline creatinine less than 1.3.  Patient's creatinine worsened to peak at 6.29.  Nephrology was consulted.  AKI was likely due to contrast exposure as well as diuretics. Creatinine has ultimately improved back to baseline.  Continue to monitor Recent Labs    08/13/23 0250 08/14/23 0344 08/16/23 0352 08/17/23 0321 08/19/23 1229 08/21/23 0820 08/22/23 1022 08/23/23 0607 08/24/23 0546 08/26/23 0536 08/27/23 0523 08/28/23 0605  BUN 48* 40* 41* 32* 40*  --  42* 44* 47* 35* 34*  --   CREATININE 5.28* 3.40* 2.17* 1.49* 1.52* 1.38* 1.19 1.39* 1.24 1.19 1.01 1.08   Acute blood loss anemia Multifactorial-mostly secondary to surgical blood loss. IV iron was avoided due to extremity wounds. Received total of  5 units of PRBC this hospitalization, last transfusion on 12/22 Hemoglobin stable for last 3 days overnight. Continue oral iron supplement Recent Labs    08/08/23 1823 08/09/23 0701 08/19/23 1822 08/22/23 1022 08/23/23 0607 08/23/23 2008 08/24/23 0546  08/26/23 0536 08/27/23 0523  HGB 9.9*   < >  --    < > 6.8* 9.3* 9.1* 9.0* 9.8*  MCV 93.5   < >  --    < > 99.1 91.8 90.9 93.1 93.7  VITAMINB12 3,571*  --   --   --   --   --   --   --   --   FOLATE 12.5  --   --   --   --   --   --   --   --   TIBC  --   --  239*  --   --   --   --   --   --   IRON  --   --  49  --   --   --   --   --   --    < > = values in this interval not displayed.   Acute metabolic encephalopathy Likely has underlying dementia CT head without acute abnormality. Currently on Seroquel 25 mg mg/50 mg mg -am/pm Reduce p.m. dose to 25 mg.  Type 2 diabetes mellitus uncontrolled Hypoglycemia A1c 9.1 on 08/07/2023 Currently on Levemir 8 units twice daily, continue SSI/Accu-Cheks Recent Labs  Lab 08/28/23 0958 08/28/23 1140 08/28/23 1651 08/28/23 2138 08/29/23 0657  GLUCAP 85 132* 123* 228* 115*   MGUS Outpatient follow-up with Dr. [name], last seen on 12/05/2022  ??  Diarrhea Feels like he needs to constantly have bowel movement but not getting anything out. Stop stool softeners and laxatives for few days  Mobility: Bedbound status after bilateral lower extremity amputation  Goals of care   Code Status: Full Code     DVT prophylaxis:  enoxaparin (LOVENOX) injection 40 mg Start: 08/19/23 1000   Antimicrobials: None currently Fluid: None Consultants: Orthopedics Family Communication: None at bedside  Status: Inpatient Level of care:  Telemetry Medical   Patient is from: Home Needs to continue in-hospital care: Pending insurance approval for SNF.   Diet:  Diet Order             Diet - low sodium heart healthy           Diet Carb Modified Fluid consistency: Thin; Room service appropriate? Yes  Diet effective now                   Scheduled Meds:  vitamin C  1,000 mg Oral Daily   aspirin EC  81 mg Oral Daily   carvedilol  3.125 mg Oral BID WC   enoxaparin (LOVENOX) injection  40 mg Subcutaneous Q24H   insulin aspart  0-6 Units  Subcutaneous TID WC   insulin detemir  8 Units Subcutaneous BID   iron polysaccharides  150 mg Oral Daily   isosorbide dinitrate  20 mg Oral BID   lidocaine  1 patch Transdermal Q24H   nutrition supplement (JUVEN)  1 packet Oral BID BM   QUEtiapine  25 mg Oral BID   sacubitril-valsartan  1 tablet Oral BID   sodium chloride flush  3 mL Intravenous Q12H    PRN meds: acetaminophen **OR** acetaminophen, labetalol, LORazepam **OR** LORazepam, ondansetron (ZOFRAN) IV, oxyCODONE   Infusions:    Antimicrobials: Anti-infectives (From admission, onward)  Start     Dose/Rate Route Frequency Ordered Stop   08/14/23 1015  vancomycin (VANCOCIN) powder  Status:  Discontinued          As needed 08/14/23 1015 08/14/23 1035   08/14/23 0815  ceFAZolin (ANCEF) IVPB 2g/100 mL premix        2 g 200 mL/hr over 30 Minutes Intravenous On call to O.R. 08/14/23 0729 08/14/23 1012   08/11/23 0800  cefTRIAXone (ROCEPHIN) 1 g in sodium chloride 0.9 % 100 mL IVPB        1 g 200 mL/hr over 30 Minutes Intravenous Daily 08/11/23 0559 08/15/23 2359   08/10/23 2200  vancomycin (VANCOCIN) IVPB 1000 mg/200 mL premix  Status:  Discontinued        1,000 mg 200 mL/hr over 60 Minutes Intravenous Every 48 hours 08/09/23 1047 08/10/23 1206   08/10/23 1206  vancomycin variable dose per unstable renal function (pharmacist dosing)  Status:  Discontinued         Does not apply See admin instructions 08/10/23 1206 08/10/23 1218   08/10/23 0600  cefUROXime (ZINACEF) 1.5 g in sodium chloride 0.9 % 100 mL IVPB        1.5 g 200 mL/hr over 30 Minutes Intravenous On call to O.R. 08/09/23 0846 08/11/23 0559   08/09/23 1400  piperacillin-tazobactam (ZOSYN) IVPB 2.25 g  Status:  Discontinued        2.25 g 100 mL/hr over 30 Minutes Intravenous Every 8 hours 08/09/23 1050 08/11/23 0557   08/08/23 2200  vancomycin (VANCOREADY) IVPB 750 mg/150 mL  Status:  Discontinued        750 mg 150 mL/hr over 60 Minutes Intravenous Every 24  hours 08/08/23 0015 08/09/23 1047   08/07/23 2330  Vancomycin (VANCOCIN) 1,500 mg in sodium chloride 0.9 % 500 mL IVPB        1,500 mg 250 mL/hr over 120 Minutes Intravenous  Once 08/07/23 2240 08/08/23 0740   08/07/23 2300  piperacillin-tazobactam (ZOSYN) IVPB 3.375 g  Status:  Discontinued        3.375 g 12.5 mL/hr over 240 Minutes Intravenous Every 8 hours 08/07/23 2240 08/09/23 1050   08/07/23 1715  cefTRIAXone (ROCEPHIN) 1 g in sodium chloride 0.9 % 100 mL IVPB        1 g 200 mL/hr over 30 Minutes Intravenous  Once 08/07/23 1707 08/07/23 1848       Objective: Vitals:   08/29/23 0401 08/29/23 0816  BP: (!) 168/89 (!) 191/80  Pulse: (!) 57 60  Resp: 15 12  Temp: 97.8 F (36.6 C) 98.1 F (36.7 C)  SpO2: 94% 100%    Intake/Output Summary (Last 24 hours) at 08/29/2023 1155 Last data filed at 08/29/2023 1146 Gross per 24 hour  Intake 240 ml  Output 600 ml  Net -360 ml   Filed Weights   08/07/23 2223 08/14/23 0813  Weight: 64.9 kg 68 kg   Weight change:  Body mass index is 22.15 kg/m.   Physical Exam: General exam: Pleasant, elderly African-American male. Skin: No rashes, lesions or ulcers. HEENT: Atraumatic, normocephalic, no obvious bleeding Lungs: Clear to auscultation bilaterally,  CVS: S1, S2, no murmur GI/Abd: Soft, nontender, nondistended, bowel sound present.  Scrotal exam shows swelling of the penis and scrotum as well as decubitus changes in the scrotum CNS: Alert, awake, oriented x 3 today.  Not delirious Psychiatry: Mood appropriate Extremities: No pedal edema, no calf tenderness.  Data Review: I have personally reviewed the laboratory data and  studies available.  F/u labs  Unresulted Labs (From admission, onward)     Start     Ordered   08/30/23 0500  Basic metabolic panel  Tomorrow morning,   R       Question:  Specimen collection method  Answer:  Lab=Lab collect   08/29/23 0742   08/14/23 0500  Creatinine, serum  (enoxaparin (LOVENOX)    CrCl  >/= 30 ml/min)  Weekly,   R     Comments: while on enoxaparin therapy    08/07/23 2044           Total time spent in review of labs and imaging, patient evaluation, formulation of plan, documentation and communication with family: 45 minutes  Signed, Lorin Glass, MD [facility] 08/29/2023

## 2023-08-29 NOTE — Plan of Care (Signed)

## 2023-08-29 NOTE — Plan of Care (Signed)
  Problem: Coping: Goal: Ability to adjust to condition or change in health will improve Outcome: Progressing   Problem: Nutritional: Goal: Maintenance of adequate nutrition will improve Outcome: Progressing Goal: Progress toward achieving an optimal weight will improve Outcome: Progressing   Problem: Skin Integrity: Goal: Risk for impaired skin integrity will decrease Outcome: Progressing   Problem: Nutrition: Goal: Adequate nutrition will be maintained Outcome: Progressing

## 2023-08-30 DIAGNOSIS — J9601 Acute respiratory failure with hypoxia: Secondary | ICD-10-CM | POA: Diagnosis not present

## 2023-08-30 LAB — BASIC METABOLIC PANEL
Anion gap: 9 (ref 5–15)
BUN: 35 mg/dL — ABNORMAL HIGH (ref 8–23)
CO2: 30 mmol/L (ref 22–32)
Calcium: 8.2 mg/dL — ABNORMAL LOW (ref 8.9–10.3)
Chloride: 101 mmol/L (ref 98–111)
Creatinine, Ser: 1 mg/dL (ref 0.61–1.24)
GFR, Estimated: >60 mL/min (ref >60)
Glucose, Bld: 119 mg/dL — ABNORMAL HIGH (ref 70–99)
Potassium: 3.1 mmol/L — ABNORMAL LOW (ref 3.5–5.1)
Sodium: 140 mmol/L (ref 135–145)

## 2023-08-30 LAB — GLUCOSE, CAPILLARY
Glucose-Capillary: 108 mg/dL — ABNORMAL HIGH (ref 70–99)
Glucose-Capillary: 214 mg/dL — ABNORMAL HIGH (ref 70–99)
Glucose-Capillary: 205 mg/dL — ABNORMAL HIGH (ref 70–99)
Glucose-Capillary: 173 mg/dL — ABNORMAL HIGH (ref 70–99)
Glucose-Capillary: 174 mg/dL — ABNORMAL HIGH (ref 70–99)

## 2023-08-30 MED ORDER — POTASSIUM CHLORIDE CRYS ER 20 MEQ PO TBCR
40.0000 meq | EXTENDED_RELEASE_TABLET | Freq: Once | ORAL | Status: AC
  Administered 2023-08-30: 40 meq via ORAL
  Filled 2023-08-30: qty 2

## 2023-08-30 MED ORDER — SPIRONOLACTONE 25 MG PO TABS
25.0000 mg | ORAL_TABLET | Freq: Every day | ORAL | Status: DC
  Administered 2023-08-30 – 2023-09-03 (×5): 25 mg via ORAL
  Filled 2023-08-30 (×5): qty 1

## 2023-08-30 MED ORDER — FUROSEMIDE 10 MG/ML IJ SOLN
40.0000 mg | Freq: Once | INTRAMUSCULAR | Status: AC
Start: 2023-08-30 — End: 2023-08-30
  Administered 2023-08-30: 40 mg via INTRAVENOUS
  Filled 2023-08-30: qty 4

## 2023-08-30 NOTE — Progress Notes (Signed)
PROGRESS NOTE  Earl Calderon  DOB: 19-Jan-1951  PCP: Melvenia Beam, MD BJY:[id number]  DOA: 08/07/2023  LOS: 23 days  Hospital Day: 24  Brief narrative: Earl Calderon is a 72 y.o. male with PMH significant for DM2, HTN, HLD, CAD/PCI, combined systolic and diastolic CHF, CKD, MGUS 12/6, patient presented to the ED with complaint of shortness of breath  At baseline, patient is able to ambulate independently.  For 2 weeks prior to presentation, patient had progressive shortness of breath and ultimately became short of breath even at rest.  Also reported associated anterior chest discomfort, worsening bilateral chronic lower extremity wounds. On exam, he was noted to have worsening lower extremity wounds. On workup, CTA chest did not show any evidence of pulm embolism Admitted to [facility] with acute respiratory failure with hypoxia secondary to CHF exacerbation as well as lower extremity wounds  12/9, underwent aortogram with bilateral lower extremity angiogram 12/13, underwent bilateral BKA See below for details  Subjective: Patient was seen and examined this morning. Lying on bed.  Not in distress.  No new symptoms. Scrotal swelling and swelling of both thighs seem to be improving with the Lasix IV.  Assessment and plan: Acute exacerbation of chronic combined systolic and diastolic CHF Essential hypertension Presented with shortness of breath, hypoxia on ambulation Previous echo with EF 30 to 35% Repeat echo 12/10 showed improvement with EF 55 to 60%, no WMA, moderate concentric LVH, grade 2 diastolic dysfunction, severely dilated LA Currently on carvedilol 3.125 mg twice daily, Imdur 20 mg twice daily, Entresto 97/103 mg twice daily as well as IV Lasix 40 mg daily Heart rate in 50s consistently.  Blood pressure currently remain elevated.  I have added back Aldactone 25 mg today. Scrotal swelling and swelling of both thighs seem to be improving Renal function is stable.    Continue IV Lasix for today.  Reassess for tomorrow. Continue to monitor blood pressure and renal function.  Scrotal swelling and decubitus injury On my exam, he has gross swelling of the scrotum and penis related to CHF. The scrotum has decubitus changes.  No open wound.  No active bleeding. 12/24, ultrasound did not show any evidence of abscess. Seen by wound care.  Local nursing care recommended. Patient feels better after scrotal area was dressed yesterday.  Acute respiratory failure with hypoxia Initially hypoxic on minimal movement Currently not requiring oxygen at rest  PAD s/p arteriogram 12/9 both feet gangrene S/p bilateral BKA -12/13 Dr. Lajoyce Corners Currently on aspirin 81 mg daily Pain control oxycodone as needed, tramadol as needed  AKI on CKD 3a Baseline creatinine less than 1.3.  Patient's creatinine worsened to peak at 6.29.  Nephrology was consulted.  AKI was likely due to contrast exposure as well as diuretics. Creatinine has ultimately improved back to baseline.  Continue to monitor Recent Labs    08/14/23 0344 08/16/23 0352 08/17/23 0321 08/19/23 1229 08/21/23 0820 08/22/23 1022 08/23/23 0607 08/24/23 0546 08/26/23 0536 08/27/23 0523 08/28/23 0605 08/30/23 0445  BUN 40* 41* 32* 40*  --  42* 44* 47* 35* 34*  --  35*  CREATININE 3.40* 2.17* 1.49* 1.52* 1.38* 1.19 1.39* 1.24 1.19 1.01 1.08 1.00   Acute blood loss anemia Multifactorial-mostly secondary to surgical blood loss. IV iron was avoided due to extremity wounds. Received total of 5 units of PRBC this hospitalization, last transfusion on 12/22 Hemoglobin stable for last 3 days overnight. Continue oral iron supplement Recent Labs    08/08/23 1823 08/09/23 0701 08/19/23 1822  08/22/23 1022 08/23/23 0607 08/23/23 2008 08/24/23 0546 08/26/23 0536 08/27/23 0523  HGB 9.9*   < >  --    < > 6.8* 9.3* 9.1* 9.0* 9.8*  MCV 93.5   < >  --    < > 99.1 91.8 90.9 93.1 93.7  VITAMINB12 3,571*  --   --   --    --   --   --   --   --   FOLATE 12.5  --   --   --   --   --   --   --   --   TIBC  --   --  239*  --   --   --   --   --   --   IRON  --   --  49  --   --   --   --   --   --    < > = values in this interval not displayed.   Acute metabolic encephalopathy Likely has underlying dementia CT head without acute abnormality. Currently on Seroquel 25 mg mg/50 mg mg -am/pm Reduce p.m. dose to 25 mg.  Type 2 diabetes mellitus uncontrolled Hypoglycemia A1c 9.1 on 08/07/2023 Currently on Levemir 8 units twice daily, continue SSI/Accu-Cheks Recent Labs  Lab 08/29/23 0657 08/29/23 1157 08/29/23 1700 08/29/23 2134 08/30/23 0638  GLUCAP 115* 237* 202* 101* 108*   MGUS Outpatient follow-up with Dr. [name], last seen on 12/05/2022  Constipation Scheduled and as needed bowel regimen  Mobility: Bedbound status after bilateral lower extremity amputation  Goals of care   Code Status: Full Code     DVT prophylaxis:  enoxaparin (LOVENOX) injection 40 mg Start: 08/19/23 1000   Antimicrobials: None currently Fluid: None Consultants: Orthopedics Family Communication: None at bedside  Status: Inpatient Level of care:  Telemetry Medical   Patient is from: Home Needs to continue in-hospital care: Medically stable for discharge.  Pending insurance approval for SNF.   Diet:  Diet Order             Diet - low sodium heart healthy           Diet Carb Modified Fluid consistency: Thin; Room service appropriate? Yes  Diet effective now                   Scheduled Meds:  vitamin C  1,000 mg Oral Daily   aspirin EC  81 mg Oral Daily   carvedilol  3.125 mg Oral BID WC   enoxaparin (LOVENOX) injection  40 mg Subcutaneous Q24H   furosemide  40 mg Intravenous Once   insulin aspart  0-6 Units Subcutaneous TID WC   insulin detemir  8 Units Subcutaneous BID   iron polysaccharides  150 mg Oral Daily   isosorbide dinitrate  20 mg Oral BID   lidocaine  1 patch Transdermal Q24H    nutrition supplement (JUVEN)  1 packet Oral BID BM   QUEtiapine  25 mg Oral BID   sacubitril-valsartan  1 tablet Oral BID   sodium chloride flush  3 mL Intravenous Q12H   spironolactone  25 mg Oral Daily    PRN meds: acetaminophen **OR** acetaminophen, labetalol, LORazepam **OR** LORazepam, ondansetron (ZOFRAN) IV, oxyCODONE   Infusions:    Antimicrobials: Anti-infectives (From admission, onward)    Start     Dose/Rate Route Frequency Ordered Stop   08/14/23 1015  vancomycin (VANCOCIN) powder  Status:  Discontinued  As needed 08/14/23 1015 08/14/23 1035   08/14/23 0815  ceFAZolin (ANCEF) IVPB 2g/100 mL premix        2 g 200 mL/hr over 30 Minutes Intravenous On call to O.R. 08/14/23 0729 08/14/23 1012   08/11/23 0800  cefTRIAXone (ROCEPHIN) 1 g in sodium chloride 0.9 % 100 mL IVPB        1 g 200 mL/hr over 30 Minutes Intravenous Daily 08/11/23 0559 08/15/23 2359   08/10/23 2200  vancomycin (VANCOCIN) IVPB 1000 mg/200 mL premix  Status:  Discontinued        1,000 mg 200 mL/hr over 60 Minutes Intravenous Every 48 hours 08/09/23 1047 08/10/23 1206   08/10/23 1206  vancomycin variable dose per unstable renal function (pharmacist dosing)  Status:  Discontinued         Does not apply See admin instructions 08/10/23 1206 08/10/23 1218   08/10/23 0600  cefUROXime (ZINACEF) 1.5 g in sodium chloride 0.9 % 100 mL IVPB        1.5 g 200 mL/hr over 30 Minutes Intravenous On call to O.R. 08/09/23 0846 08/11/23 0559   08/09/23 1400  piperacillin-tazobactam (ZOSYN) IVPB 2.25 g  Status:  Discontinued        2.25 g 100 mL/hr over 30 Minutes Intravenous Every 8 hours 08/09/23 1050 08/11/23 0557   08/08/23 2200  vancomycin (VANCOREADY) IVPB 750 mg/150 mL  Status:  Discontinued        750 mg 150 mL/hr over 60 Minutes Intravenous Every 24 hours 08/08/23 0015 08/09/23 1047   08/07/23 2330  Vancomycin (VANCOCIN) 1,500 mg in sodium chloride 0.9 % 500 mL IVPB        1,500 mg 250 mL/hr over 120  Minutes Intravenous  Once 08/07/23 2240 08/08/23 0740   08/07/23 2300  piperacillin-tazobactam (ZOSYN) IVPB 3.375 g  Status:  Discontinued        3.375 g 12.5 mL/hr over 240 Minutes Intravenous Every 8 hours 08/07/23 2240 08/09/23 1050   08/07/23 1715  cefTRIAXone (ROCEPHIN) 1 g in sodium chloride 0.9 % 100 mL IVPB        1 g 200 mL/hr over 30 Minutes Intravenous  Once 08/07/23 1707 08/07/23 1848       Objective: Vitals:   08/30/23 0914 08/30/23 1053  BP: (!) 176/78 (!) 195/84  Pulse: (!) 56   Resp:  17  Temp: (!) 97.4 F (36.3 C)   SpO2: 98%     Intake/Output Summary (Last 24 hours) at 08/30/2023 1118 Last data filed at 08/29/2023 2208 Gross per 24 hour  Intake 483 ml  Output 450 ml  Net 33 ml   Filed Weights   08/07/23 2223 08/14/23 0813  Weight: 64.9 kg 68 kg   Weight change:  Body mass index is 22.15 kg/m.   Physical Exam: General exam: Pleasant, elderly African-American male.  Not in physical distress. Skin: No rashes, lesions or ulcers. HEENT: Atraumatic, normocephalic, no obvious bleeding Lungs: Clear to auscultation bilaterally,  CVS: S1, S2, no murmur GI/Abd: Soft, nontender, nondistended, bowel sound present.  Scrotal exam shows swelling of the penis and scrotum as well as decubitus changes in the scrotum.  Skin wrinkling noted CNS: Alert, awake, oriented x 3 today.  Not delirious Psychiatry: Mood appropriate Extremities: No pedal edema, no calf tenderness.  Data Review: I have personally reviewed the laboratory data and studies available.  F/u labs  Unresulted Labs (From admission, onward)     Start     Ordered   08/14/23  0500  Creatinine, serum  (enoxaparin (LOVENOX)    CrCl >/= 30 ml/min)  Weekly,   R     Comments: while on enoxaparin therapy    08/07/23 2044           Total time spent in review of labs and imaging, patient evaluation, formulation of plan, documentation and communication with family: 45 minutes  Signed, Lorin Glass,  MD [facility] 08/30/2023

## 2023-08-30 NOTE — Plan of Care (Signed)
  Problem: Education: Goal: Ability to describe self-care measures that may prevent or decrease complications (Diabetes Survival Skills Education) will improve Outcome: Progressing Goal: Individualized Educational Video(s) Outcome: Progressing   Problem: Coping: Goal: Ability to adjust to condition or change in health will improve Outcome: Progressing   Problem: Fluid Volume: Goal: Ability to maintain a balanced intake and output will improve Outcome: Progressing   Problem: Health Behavior/Discharge Planning: Goal: Ability to identify and utilize available resources and services will improve Outcome: Progressing Goal: Ability to manage health-related needs will improve Outcome: Progressing   Problem: Metabolic: Goal: Ability to maintain appropriate glucose levels will improve Outcome: Progressing   Problem: Nutritional: Goal: Maintenance of adequate nutrition will improve Outcome: Progressing Goal: Progress toward achieving an optimal weight will improve Outcome: Progressing   Problem: Skin Integrity: Goal: Risk for impaired skin integrity will decrease Outcome: Progressing   Problem: Tissue Perfusion: Goal: Adequacy of tissue perfusion will improve Outcome: Progressing   Problem: Education: Goal: Knowledge of General Education information will improve Description: Including pain rating scale, medication(s)/side effects and non-pharmacologic comfort measures Outcome: Progressing   Problem: Health Behavior/Discharge Planning: Goal: Ability to manage health-related needs will improve Outcome: Progressing   Problem: Clinical Measurements: Goal: Ability to maintain clinical measurements within normal limits will improve Outcome: Progressing Goal: Will remain free from infection Outcome: Progressing Goal: Diagnostic test results will improve Outcome: Progressing Goal: Respiratory complications will improve Outcome: Progressing Goal: Cardiovascular complication will  be avoided Outcome: Progressing   Problem: Activity: Goal: Risk for activity intolerance will decrease Outcome: Progressing   Problem: Nutrition: Goal: Adequate nutrition will be maintained Outcome: Progressing   Problem: Coping: Goal: Level of anxiety will decrease Outcome: Progressing   Problem: Elimination: Goal: Will not experience complications related to bowel motility Outcome: Progressing Goal: Will not experience complications related to urinary retention Outcome: Progressing   Problem: Pain Management: Goal: General experience of comfort will improve Outcome: Progressing   Problem: Safety: Goal: Ability to remain free from injury will improve Outcome: Progressing   Problem: Skin Integrity: Goal: Risk for impaired skin integrity will decrease Outcome: Progressing   Problem: Education: Goal: Knowledge of the prescribed therapeutic regimen will improve Outcome: Progressing Goal: Ability to verbalize activity precautions or restrictions will improve Outcome: Progressing Goal: Understanding of discharge needs will improve Outcome: Progressing   Problem: Activity: Goal: Ability to perform//tolerate increased activity and mobilize with assistive devices will improve Outcome: Progressing   Problem: Clinical Measurements: Goal: Postoperative complications will be avoided or minimized Outcome: Progressing   Problem: Self-Care: Goal: Ability to meet self-care needs will improve Outcome: Progressing   Problem: Self-Concept: Goal: Ability to maintain and perform role responsibilities to the fullest extent possible will improve Outcome: Progressing   Problem: Pain Management: Goal: Pain level will decrease with appropriate interventions Outcome: Progressing   Problem: Skin Integrity: Goal: Demonstration of wound healing without infection will improve Outcome: Progressing

## 2023-08-30 NOTE — Progress Notes (Signed)
Patient is clinically better and stable for last several days, just waiting for a SNF.  I called and had a long conversation with patient's wife. She was upset for many reasons. She couldn't elaborate in details what specific concern she had but kept on repeating 'it is a lot of things', 'no body is taking care of him'. She said, '3 weeks ago, he was short of breath and nobody was concerned. He was in heart failure and they did nothing. Nobody told me about amputation.'  I tried multiple times to understand what specific question she was trying to ask but failed as she would keep on repeating the same grievances from past.  At least for the last week since I have been seeing him, patient is not on O2, CHF is compensated. Mental status is intact. Patient has had no complain by himself. SW has been trying to find a SNF for him. I tried my best to give her the current status update.   In the end, I offered her another phone call in few hours or later date when she is in a better mood.

## 2023-08-31 DIAGNOSIS — J9601 Acute respiratory failure with hypoxia: Secondary | ICD-10-CM | POA: Diagnosis not present

## 2023-08-31 LAB — GLUCOSE, CAPILLARY
Glucose-Capillary: 183 mg/dL — ABNORMAL HIGH (ref 70–99)
Glucose-Capillary: 150 mg/dL — ABNORMAL HIGH (ref 70–99)
Glucose-Capillary: 296 mg/dL — ABNORMAL HIGH (ref 70–99)
Glucose-Capillary: 182 mg/dL — ABNORMAL HIGH (ref 70–99)

## 2023-08-31 NOTE — Progress Notes (Signed)
PROGRESS NOTE  Earl Calderon OZH:[id number] DOB: 11-21-1950 DOA: 08/07/2023 PCP: Melvenia Beam, MD   LOS: 24 days   Brief Narrative / Interim history: Earl Calderon is a 72 y.o. male with PMH significant for DM2, HTN, HLD, CAD/PCI, combined systolic and diastolic CHF, CKD, MGUS, 12/6, patient presented to the ED with complaint of shortness of breath.  He also was having bilateral lower extremity swelling and worsening of his wounds.  He was admitted for CHF exacerbation, placed on diuretics and vascular surgery was consulted for his wounds, underwent aortogram and eventually underwent bilateral BKA  Subjective / 24h Interval events: Doing well, in bed.  Denies any shortness of breath  Assesement and Plan: Principal Problem:   Acute respiratory failure with hypoxia (HCC) Active Problems:   Chronic combined systolic and diastolic heart failure (HCC)   Essential hypertension   Acute renal failure superimposed on stage 3a chronic kidney disease (HCC)   Acute postoperative anemia due to expected blood loss   Gangrene of both feet (HCC)   Hypoxia   S/P bilateral BKA (below knee amputation) (HCC) - on 08/14/2023   Delirium   Type 2 diabetes mellitus with chronic kidney disease, with long-term current use of insulin (HCC)   MGUS (monoclonal gammopathy of unknown significance)   Diabetes mellitus with foot ulcer and gangrene (HCC)   Ulcer of both feet, limited to breakdown of skin (HCC)   Principal problem Acute on chronic combined CHF exacerbation -patient initially required IV diuresis.  Prior echos had an EF as low as 40 to 45%, current EF seems recovered at 55 to 60%. -Close to euvolemic now, placed on oral furosemide.  Continue Coreg, Imdur, Entresto, spironolactone  Active problems Acute hypoxic respiratory failure-resolved, tolerating room air and appears comfortable  PAD, bilateral feet gangrene-status post bilateral BKA on 12/13 by Dr. Lajoyce Corners.  Currently on  aspirin  Acute kidney injury on CKD 3A - baseline creatinine around 1.3, peak was 6.2.  Nephrology was consulted.  Now back to baseline  Acute blood loss anemia-postoperatively, status post 5 units of packed red blood cells with last transfusion 12/22.  Hemoglobin stable  Acute metabolic encephalopathy-continue Seroquel, possibly hospital induced, alert and appropriate this morning  MGUS-seeing Dr. [name] as an outpatient  DM2, uncontrolled, with hyperglycemia-continue Levemir, sliding scale   Scheduled Meds:  vitamin C  1,000 mg Oral Daily   aspirin EC  81 mg Oral Daily   carvedilol  3.125 mg Oral BID WC   enoxaparin (LOVENOX) injection  40 mg Subcutaneous Q24H   insulin aspart  0-6 Units Subcutaneous TID WC   insulin detemir  8 Units Subcutaneous BID   iron polysaccharides  150 mg Oral Daily   isosorbide dinitrate  20 mg Oral BID   lidocaine  1 patch Transdermal Q24H   nutrition supplement (JUVEN)  1 packet Oral BID BM   QUEtiapine  25 mg Oral BID   sacubitril-valsartan  1 tablet Oral BID   sodium chloride flush  3 mL Intravenous Q12H   spironolactone  25 mg Oral Daily   Continuous Infusions: PRN Meds:.acetaminophen **OR** acetaminophen, labetalol, LORazepam **OR** LORazepam, ondansetron (ZOFRAN) IV, oxyCODONE  Current Outpatient Medications  Medication Instructions   APPLE CIDER VINEGAR PO 1 tablet, Oral, Daily   ascorbic acid (VITAMIN C) 1,000 mg, Oral, Daily, Take with iron pill   aspirin EC (ASPIRIN LOW DOSE) 81 mg, Oral, Daily, Swallow whole.   [Paused] carvedilol (COREG) 6.25 mg, Oral, 2 times daily with meals   carvedilol (  COREG) 3.125 mg, Oral, 2 times daily with meals   ELDERBERRY PO 100 mg, Oral, Daily   Ergocalciferol (VITAMIN D2 PO) 2 tablets, Oral, Daily   ferrous sulfate 325 mg, Oral, Daily with breakfast   [Paused] furosemide (LASIX) 40 MG tablet TAKE 1 TABLET BY MOUTH ONCE DAILY   glucose blood (FREESTYLE TEST STRIPS) test strip Use as instructed    HUMALOG KWIKPEN 100 UNIT/ML KwikPen Subcutaneous, Sliding Scale   hydrALAZINE (APRESOLINE) 25 mg, Oral, 3 times daily   insulin aspart (NOVOLOG) 0-6 Units, Subcutaneous, 3 times daily with meals   insulin detemir (LEVEMIR) 10 Units, Subcutaneous, 2 times daily, Hold if blood sugar less than 100   Insulin Pen Needle (NOVOFINE PEN NEEDLE) 32G X 6 MM MISC USE AS DIRECTED   isosorbide dinitrate (ISORDIL) 20 mg, Oral, 2 times daily   JARDIANCE 10 MG TABS tablet TAKE ONE TABLET BY MOUTH DAILY BEFORE BREAKFAST   Levemir FlexTouch 8 Units, Subcutaneous, 2 times daily   nitroGLYCERIN (NITROSTAT) 0.4 mg, Sublingual, Every 5 min PRN   Omega-3 Fatty Acids (FISH OIL PO) 1 tablet, Oral, Daily   Probiotic Product (PROBIOTIC MULTI-ENZYME) TABS 3 tablets, Oral, Daily   QUEtiapine (SEROQUEL) 25 mg, Oral, Daily with breakfast   QUEtiapine (SEROQUEL) 50 mg, Oral, Every evening, Around 8 pm   rosuvastatin (CRESTOR) 40 MG tablet TAKE 1 TABLET BY MOUTH ONCE DAILY AT 6PM   [Paused] sacubitril-valsartan (ENTRESTO) 97-103 MG 1 tablet, Oral, 2 times daily   [Paused] spironolactone (ALDACTONE) 25 MG tablet TAKE 1 TABLET BY MOUTH ONCE DAILY   Turmeric 500 MG TABS 2 tablets, Oral, Daily   Zinc Sulfate 220 mg, Oral, Daily    Diet Orders (From admission, onward)     Start     Ordered   08/23/23 0000  Diet - low sodium heart healthy        08/23/23 1545   08/14/23 1124  Diet Carb Modified Fluid consistency: Thin; Room service appropriate? Yes  Diet effective now       Question Answer Comment  Calorie Level Medium 1600-2000   Fluid consistency: Thin   Room service appropriate? Yes      08/14/23 1123            DVT prophylaxis: enoxaparin (LOVENOX) injection 40 mg Start: 08/19/23 1000   Lab Results  Component Value Date   PLT 255 08/27/2023      Code Status: Full Code  Family Communication: no family at bedside   Status is: Inpatient Remains inpatient appropriate because: severity of illness, SNF  placement pending  Level of care: Telemetry Medical  Consultants:  Vascular surgery  Orthopedics  Objective: Vitals:   08/30/23 1517 08/30/23 1949 08/31/23 0500 08/31/23 0918  BP: (!) 171/75 (!) 173/73 (!) 192/82 (!) 158/67  Pulse: (!) 57 60 (!) 54 (!) 56  Resp: 16 15 13 16   Temp: 98.2 F (36.8 C) 98.3 F (36.8 C) 98 F (36.7 C) 98.2 F (36.8 C)  TempSrc: Oral Oral Oral Oral  SpO2: 97% 98% 97% 100%  Weight:      Height:        Intake/Output Summary (Last 24 hours) at 08/31/2023 1253 Last data filed at 08/30/2023 2100 Gross per 24 hour  Intake --  Output 800 ml  Net -800 ml   Wt Readings from Last 3 Encounters:  08/14/23 68 kg  03/12/23 65 kg  12/29/22 66.1 kg    Examination:  Constitutional: NAD Eyes: no scleral icterus ENMT: Mucous membranes  are moist.  Neck: normal, supple Respiratory: clear to auscultation bilaterally, no wheezing, no crackles. Normal respiratory effort.  Cardiovascular: Regular rate and rhythm, no murmurs / rubs / gallops. No LE edema.  Abdomen: non distended, no tenderness. Bowel sounds positive.  Musculoskeletal: no clubbing / cyanosis.   Data Reviewed: I have independently reviewed following labs and imaging studies   CBC Recent Labs  Lab 08/26/23 0536 08/27/23 0523  WBC 7.7 9.3  HGB 9.0* 9.8*  HCT 28.2* 31.0*  PLT 235 255  MCV 93.1 93.7  MCH 29.7 29.6  MCHC 31.9 31.6  RDW 18.4* 17.8*  LYMPHSABS 1.0 1.0  MONOABS 0.5 0.6  EOSABS 0.2 0.3  BASOSABS 0.0 0.0    Recent Labs  Lab 08/26/23 0536 08/27/23 0523 08/28/23 0605 08/30/23 0445  NA 138 141  --  140  K 4.0 3.7  --  3.1*  CL 101 103  --  101  CO2 30 30  --  30  GLUCOSE 178* 134*  --  119*  BUN 35* 34*  --  35*  CREATININE 1.19 1.01 1.08 1.00  CALCIUM 8.2* 8.4*  --  8.2*    ------------------------------------------------------------------------------------------------------------------ No results for input(s): "CHOL", "HDL", "LDLCALC", "TRIG", "CHOLHDL",  "LDLDIRECT" in the last 72 hours.  Lab Results  Component Value Date   HGBA1C 9.1 (H) 08/07/2023   ------------------------------------------------------------------------------------------------------------------ No results for input(s): "TSH", "T4TOTAL", "T3FREE", "THYROIDAB" in the last 72 hours.  Invalid input(s): "FREET3"  Cardiac Enzymes No results for input(s): "CKMB", "TROPONINI", "MYOGLOBIN" in the last 168 hours.  Invalid input(s): "CK" ------------------------------------------------------------------------------------------------------------------    Component Value Date/Time   BNP 103.4 (H) 08/22/2023 1022    CBG: Recent Labs  Lab 08/30/23 1626 08/30/23 2108 08/30/23 2137 08/31/23 0920 08/31/23 1214  GLUCAP 205* 173* 174* 183* 150*    No results found for this or any previous visit (from the past 240 hours).   Radiology Studies: No results found.   Pamella Pert, MD, PhD [facility]  Between 7 am - 7 pm I am available, please contact me via Amion (for emergencies) or Securechat (non urgent messages)  Between 7 pm - 7 am I am not available, please contact night coverage MD/APP via Amion

## 2023-08-31 NOTE — TOC Progression Note (Addendum)
Transition of Care [facility]) - Progression Note    Patient Details  Name: Earl Calderon MRN: [id number] Date of Birth: 1950/12/11  Transition of Care [facility]) CM/SW Contact  Lorri Frederick, LCSW Phone Number: 08/31/2023, 9:18 AM  Clinical Narrative:   CSW spoke with pt regarding SNF choice.  He would like to accept offer at [facility] in [place].  CSW spoke with Irving Burton at [place] and she can receive pt today.  Will need new insurance auth for [place], new PT note required for Auth request.  PT notified.   0940: CSW called back to room, pt with wife on phone, now wanting to discuss other SNF options, possibly [facility].    1200: Pt now asking CSW to contact [facility].  CSW spoke with Leafy Kindle and she will look at pt referral to see if pt is appropriate.    1300: [facility]: they cannot offer bed, their MD agrees that pt needs SNF.   1320: Pt updated that [facility] cannot offer bed, SNF bed offers again discussed.  Pt states his is unable to make SNF decision at this time, states he still needs to "call some people."    [facility] leadership notified.    Expected Discharge Plan: Home w Home Health Services Barriers to Discharge: Continued Medical Work up  Expected Discharge Plan and Services In-house Referral: Clinical Social Work Discharge Planning Services: CM Consult                     DME Arranged: Other see comment (hoyer lift) DME Agency: [employer] Date DME Agency Contacted: 08/21/23 Time DME Agency Contacted: [phone] Representative spoke with at DME Agency: Ian Malkin HH Arranged: PT, OT HH Agency: [facility] Date [facility] Agency Contacted: 08/20/23 Time HH Agency Contacted: 1530 Representative spoke with at [facility] Agency: Amy   Social Determinants of Health (SDOH) Interventions SDOH Screenings   Food Insecurity: No Food Insecurity (08/07/2023)  Housing: Low Risk  (08/07/2023)  Transportation Needs: No Transportation Needs  (08/07/2023)  Utilities: Not At Risk (08/07/2023)  Depression (PHQ2-9): Low Risk  (10/18/2020)  Financial Resource Strain: Medium Risk (09/21/2020)  Tobacco Use: Low Risk  (08/14/2023)    Readmission Risk Interventions     No data to display

## 2023-08-31 NOTE — Progress Notes (Signed)
Physical Therapy Treatment Patient Details Name: Earl Calderon MRN: [id number] DOB: Jan 04, 1951 Today's Date: 08/31/2023   History of Present Illness Patient is a 72 yo male presenting to the ED with SOB and fatigue on 08/07/23. Admitted with acute respiratory failure with hypoxia. Found to have bilateral lower extremity wounds, gangrene, chronic limb ischemia. Arteriogram performed on 12/9. 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  PMH includes: CAD, CKD stage III, DM type II, uncontrolled, MGUS, chronic systolic CHF, NSVT, resistant hypertension, hyperlipidemia.    PT Comments  Pt received in supine, agreeable to therapy session with encouragement, with good participation in supine/seated exercises once he began. Pt needing up to minA for posterior scooting in long sitting in bed and bed mobility with heavy use of bil bed rails/features. Pt c/o bottom pain and defers PTA to change his bed linens/sheets under him, requesting RN to do this when she gives him a bath, RN notified. Pt performed supine/seated exercises with emphasis on neutral hip/knee posture and pt defers sidelying/prone exercises after exercise and transfer training. Pt defers OOB to chair due to fatigue and anxiety over disposition choice. Pt will continue to benefit from skilled rehab in a post acute setting < 3 hours/day to maximize functional gains before returning home.    If plan is discharge home, recommend the following: Two people to help with walking and/or transfers;Assistance with cooking/housework;Assist for transportation;Help with stairs or ramp for entrance;Supervision due to cognitive status;Two people to help with bathing/dressing/bathroom   Can travel by private vehicle     No  Equipment Recommendations  Wheelchair (measurements PT);Wheelchair cushion (measurements PT);Hoyer lift;Other (comment);Hospital bed (double amputee wheelchair, ramp, drop arm BSC, slide board)    Recommendations for Other Services Other  (comment) (Unclear cognitive baseline; consider more thorough cog work-up, however pt starting to participate better)     Precautions / Restrictions Precautions Precautions: Fall Precaution Comments: significant scrotal/bottom discomfort Restrictions Weight Bearing Restrictions Per Provider Order: Yes RLE Weight Bearing Per Provider Order: Non weight bearing LLE Weight Bearing Per Provider Order: Non weight bearing Other Position/Activity Restrictions: Bilateral BKA     Mobility  Bed Mobility Overal bed mobility: Needs Assistance Bed Mobility: Supine to Sit, Sit to Supine     Supine to sit: HOB elevated, Used rails, Min assist Sit to supine: Min assist, Used rails   General bed mobility comments: min assist for long sitting with use of bil bed rails from elevated posture and for positioning self for A-P transfer    Transfers Overall transfer level: Needs assistance Equipment used: None (bed pad) Transfers: Bed to chair/wheelchair/BSC         Anterior-Posterior transfers: Min assist, Contact guard assist   General transfer comment: Initially pt needing minA for posterior scooting in long sitting, pt progressed to CGA after visual/verbal demo and bed pad assist. Pt able to perform 3 more posterior scoots in long sitting but defers OOB to recliner transfer due to pain/fatigue after supine/seated exercises and wanting to review SNF bed offers.    Ambulation/Gait                   Psychologist, counselling mobility:  (defer, pt may be agreeable next session)   Tilt Bed    Modified Rankin (Stroke Patients Only)       Balance Overall balance assessment: Needs assistance Sitting-balance support: Bilateral upper extremity supported, Feet unsupported Sitting balance-Leahy Scale: Fair Sitting balance -  Comments: min assist but progressed to CGA       Standing balance comment: Not applicable                             Cognition Arousal: Alert Behavior During Therapy: WFL for tasks assessed/performed, Anxious Overall Cognitive Status: No family/caregiver present to determine baseline cognitive functioning                                 General Comments: Pt perseverating on having to choose a SNF and reluctant to participate but agreeable with encouragement.        Exercises Amputee Exercises Quad Sets: AROM, Both, 10 reps, Supine Towel Squeeze: AROM, Both, 10 reps, Supine Hip ABduction/ADduction: AROM, Both, 10 reps, Supine Knee Extension: AROM, Both, 10 reps, Supine Chair Push Up: AROM, Both, 10 reps, Seated (long sitting in bed, pt pushing BUE into mattress and extending elbows fully to lift hips off the bed) Other Exercises Other Exercises: pt refusing sidelying or prone hip exercises this date due to c/o fatigue    General Comments General comments (skin integrity, edema, etc.): pt c/o bottom pain, and LLE dressing coming loose, and bed linens appear yellow/dried but pt not agreeable to PTA changing his sheets during session, RN notified of all of the above. pt did allow PTA to put thicker cream on his head (where pt is bald) and back of neck/back due to c/o itchy sensation and dry skin observed. Pt reports he is agreeable to nursing staff changing his bed linens when they give him a bath, nursing notified.      Pertinent Vitals/Pain Pain Assessment Pain Assessment: Faces Faces Pain Scale: Hurts little more Pain Location: bottom and scrotal area with scooting and BLE with exercises in supine Pain Descriptors / Indicators: Guarding, Grimacing, Discomfort, Sharp, Throbbing Pain Intervention(s): Limited activity within patient's tolerance, Monitored during session, Repositioned, Other (comment) (pt describes intermittent phantom pain, PTA reviews tapping/light rubbing over extremities for decreased phantom pain/desensitization)    Home Living                           Prior Function            PT Goals (current goals can now be found in the care plan section) Acute Rehab PT Goals Patient Stated Goal: to go home and work toward bil prosthetic limbs/walking PT Goal Formulation: With patient Time For Goal Achievement: 08/29/23 Progress towards PT goals: Progressing toward goals    Frequency    Min 1X/week      PT Plan      Co-evaluation              AM-PAC PT "6 Clicks" Mobility   Outcome Measure  Help needed turning from your back to your side while in a flat bed without using bedrails?: A Little Help needed moving from lying on your back to sitting on the side of a flat bed without using bedrails?: A Lot (w/o rails) Help needed moving to and from a bed to a chair (including a wheelchair)?: A Lot Help needed standing up from a chair using your arms (e.g., wheelchair or bedside chair)?: Total Help needed to walk in hospital room?: Total Help needed climbing 3-5 steps with a railing? : Total 6 Click Score: 10    End of Session Equipment Utilized  During Treatment: Other (comment) (bed pad assist) Activity Tolerance: Patient tolerated treatment well;Patient limited by fatigue Patient left: in bed;with call bell/phone within reach;with bed alarm set;Other (comment) (bed in chair posture, knees straight) Nurse Communication: Mobility status;Other (comment) (pt needs new sheets placed/bath and needs LLE limb re-wrapped and wound dressed) PT Visit Diagnosis: Other abnormalities of gait and mobility (R26.89)     Time: 3875-6433 PT Time Calculation (min) (ACUTE ONLY): 16 min  Charges:    $Therapeutic Exercise: 8-22 mins PT General Charges $$ ACUTE PT VISIT: 1 Visit                     Marleen Moret P., PTA Acute Rehabilitation Services Secure Chat Preferred 9a-5:30pm Office: [phone]    Angus Palms 08/31/2023, 5:40 PM

## 2023-08-31 NOTE — Progress Notes (Addendum)
PT Cancellation Note  Patient Details Name: Earl Calderon MRN: [id number] DOB: [birth year]   Cancelled Treatment:    Reason Eval/Treat Not Completed: (P) Other (comment) (pt requesting to use bed pan prior to OOB transfer training. PTA offered to assist him to transfer to [facility] but pt defers due to urgency/fatigue, RN/NT notified of pt request for bed pan.  12:40pm PTA reattempt but pt still on bed pan and not ready to get off of it.  Pt agreeable to try transfer OOB to recliner a little later in the day.) Will continue efforts per PT plan of care as schedule permits.   Dorathy Kinsman Carliss Porcaro 08/31/2023, 12:16 PM

## 2023-09-01 DIAGNOSIS — J9601 Acute respiratory failure with hypoxia: Secondary | ICD-10-CM | POA: Diagnosis not present

## 2023-09-01 LAB — COMPREHENSIVE METABOLIC PANEL
ALT: 15 U/L (ref 0–44)
AST: 13 U/L — ABNORMAL LOW (ref 15–41)
Albumin: 1.8 g/dL — ABNORMAL LOW (ref 3.5–5.0)
Alkaline Phosphatase: 52 U/L (ref 38–126)
Anion gap: 8 (ref 5–15)
BUN: 39 mg/dL — ABNORMAL HIGH (ref 8–23)
CO2: 30 mmol/L (ref 22–32)
Calcium: 8 mg/dL — ABNORMAL LOW (ref 8.9–10.3)
Chloride: 100 mmol/L (ref 98–111)
Creatinine, Ser: 0.98 mg/dL (ref 0.61–1.24)
GFR, Estimated: >60 mL/min (ref >60)
Glucose, Bld: 137 mg/dL — ABNORMAL HIGH (ref 70–99)
Potassium: 3.6 mmol/L (ref 3.5–5.1)
Sodium: 138 mmol/L (ref 135–145)
Total Bilirubin: 0.4 mg/dL (ref 0.0–1.2)
Total Protein: 6 g/dL — ABNORMAL LOW (ref 6.5–8.1)

## 2023-09-01 LAB — GLUCOSE, CAPILLARY
Glucose-Capillary: 149 mg/dL — ABNORMAL HIGH (ref 70–99)
Glucose-Capillary: 189 mg/dL — ABNORMAL HIGH (ref 70–99)
Glucose-Capillary: 190 mg/dL — ABNORMAL HIGH (ref 70–99)
Glucose-Capillary: 192 mg/dL — ABNORMAL HIGH (ref 70–99)

## 2023-09-01 LAB — CBC
HCT: 28.4 % — ABNORMAL LOW (ref 39.0–52.0)
Hemoglobin: 9.2 g/dL — ABNORMAL LOW (ref 13.0–17.0)
MCH: 30.3 pg (ref 26.0–34.0)
MCHC: 32.4 g/dL (ref 30.0–36.0)
MCV: 93.4 fL (ref 80.0–100.0)
Platelets: 218 10*3/uL (ref 150–400)
RBC: 3.04 MIL/uL — ABNORMAL LOW (ref 4.22–5.81)
RDW: 16.8 % — ABNORMAL HIGH (ref 11.5–15.5)
WBC: 4.3 10*3/uL (ref 4.0–10.5)
nRBC: 0 % (ref 0.0–0.2)

## 2023-09-01 LAB — MAGNESIUM: Magnesium: 1.6 mg/dL — ABNORMAL LOW (ref 1.7–2.4)

## 2023-09-01 MED ORDER — POTASSIUM CHLORIDE CRYS ER 20 MEQ PO TBCR
30.0000 meq | EXTENDED_RELEASE_TABLET | Freq: Once | ORAL | Status: AC
  Administered 2023-09-01: 30 meq via ORAL
  Filled 2023-09-01: qty 1

## 2023-09-01 MED ORDER — FUROSEMIDE 40 MG PO TABS
40.0000 mg | ORAL_TABLET | Freq: Every day | ORAL | Status: DC
  Administered 2023-09-01 – 2023-09-03 (×3): 40 mg via ORAL
  Filled 2023-09-01 (×3): qty 1

## 2023-09-01 MED ORDER — MAGNESIUM SULFATE 2 GM/50ML IV SOLN
2.0000 g | Freq: Once | INTRAVENOUS | Status: AC
Start: 2023-09-01 — End: 2023-09-01
  Administered 2023-09-01: 2 g via INTRAVENOUS
  Filled 2023-09-01: qty 50

## 2023-09-01 NOTE — TOC Progression Note (Addendum)
 Transition of Care [facility]) - Progression Note    Patient Details  Name: Earl Calderon MRN: [id number] Date of Birth: [birth year]  Transition of Care [facility]) CM/SW Contact  Bridget Cordella Simmonds, LCSW Phone Number: 09/01/2023, 10:09 AM  Clinical Narrative:   [name].  Pt has continued to call them and request admission.  She informed CSW that she has informed pt several times that they cannot accept him.  CSW spoke with pt in room.  Pt states he wants to go to [facility], appears to be talking about [facility], discussed that they cannot accept him.  Pt states he is working around the clock to find the ideal place to go but has not made a decision yet.    1300: CSW and TOC supervisor Erminio Herring (on speakerphone) spoke with pt again, pt accepts bed offer at [facility].  SNF auth request submitted in [place].  CSW had previously confirmed with [facility] that they can receive pt one auth approved.  Will need to recheck due to holiday tomorrow.   Expected Discharge Plan: Home w Home Health Services Barriers to Discharge: Continued Medical Work up  Expected Discharge Plan and Services In-house Referral: Clinical Social Work Discharge Planning Services: CM Consult                     DME Arranged: Other see comment (hoyer lift) DME Agency: [employer] Date DME Agency Contacted: 08/21/23 Time DME Agency Contacted: [phone] Representative spoke with at DME Agency: Darlyn HH Arranged: PT, OT HH Agency: [facility] Date [facility] Agency Contacted: 08/20/23 Time HH Agency Contacted: 1530 Representative spoke with at [facility] Agency: Amy   Social Determinants of Health (SDOH) Interventions SDOH Screenings   Food Insecurity: No Food Insecurity (08/07/2023)  Housing: Low Risk  (08/07/2023)  Transportation Needs: No Transportation Needs (08/07/2023)  Utilities: Not At Risk (08/07/2023)  Depression (PHQ2-9): Low Risk  (10/18/2020)  Financial Resource Strain: Medium Risk  (09/21/2020)  Tobacco Use: Low Risk  (08/14/2023)    Readmission Risk Interventions     No data to display

## 2023-09-01 NOTE — Plan of Care (Signed)
  Problem: Coping: Goal: Ability to adjust to condition or change in health will improve Outcome: Progressing   Problem: Fluid Volume: Goal: Ability to maintain a balanced intake and output will improve Outcome: Progressing   Problem: Health Behavior/Discharge Planning: Goal: Ability to manage health-related needs will improve Outcome: Progressing

## 2023-09-01 NOTE — Progress Notes (Signed)
 Physical Therapy Treatment Patient Details Name: Earl Calderon MRN: [id number] DOB: 1951-02-14 Today's Date: 09/01/2023   History of Present Illness Patient is a 72 yo male presenting to the ED with SOB and fatigue on 08/07/23. Admitted with acute respiratory failure with hypoxia. Found to have bilateral lower extremity wounds, gangrene, chronic limb ischemia. Arteriogram performed on 12/9. 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  PMH includes: CAD, CKD stage III, DM type II, uncontrolled, MGUS, chronic systolic CHF, NSVT, resistant hypertension, hyperlipidemia.    PT Comments  Pt received in supine, perseverating on anxiety regarding his discharge plans and need to pick location for post-acute therapies, pt defers to attempt OOB transfer to [facility] or chair, despite encouragement. Pt given HEP handout with info on residual limb wrapping and PTA reinforced pressure relief frequency/technique with pt as he had decreased carryover of this info from previous sessions. Pt encouraged to notify staff more frequently when needing positional changes, NT notified as well. Pt will continue to benefit from skilled rehab in a post acute setting to maximize functional gains before returning home.     If plan is discharge home, recommend the following: Two people to help with walking and/or transfers;Assistance with cooking/housework;Assist for transportation;Help with stairs or ramp for entrance;Supervision due to cognitive status;Two people to help with bathing/dressing/bathroom   Can travel by private vehicle     No  Equipment Recommendations  Wheelchair (measurements PT);Wheelchair cushion (measurements PT);Hoyer lift;Other (comment);Hospital bed (double amputee wheelchair, ramp, drop arm BSC, slide board)    Recommendations for Other Services Other (comment) (Unclear cognitive baseline; consider more thorough cog work-up)     Precautions / Restrictions Precautions Precautions: Fall Precaution Comments:  significant scrotal/bottom discomfort Restrictions Weight Bearing Restrictions Per Provider Order: Yes RLE Weight Bearing Per Provider Order: Non weight bearing LLE Weight Bearing Per Provider Order: Non weight bearing Other Position/Activity Restrictions: Bilateral BKA     Mobility  Bed Mobility Overal bed mobility: Needs Assistance Bed Mobility: Rolling Rolling: Min assist, Mod assist, Used rails         General bed mobility comments: pt refusing EOB/OOB, he rolls toward his R side with minA but intermittently needing modA to lie more fully onto his side. Needs sequencing cues and uses rail.    Transfers Overall transfer level: Needs assistance                 General transfer comment: Pt defers, PTA encouraging him to consider attempting with nursing staff backward scoot onto [facility] later in day when he needs to use it. Pt states I will need it but not yet but refusing posterior scoot OOB to chair also.    Ambulation/Gait                   Psychologist, Counselling mobility:  (pt defers this date)   Tilt Bed    Modified Rankin (Stroke Patients Only)       Balance Overall balance assessment: Needs assistance     Sitting balance - Comments: pt defers       Standing balance comment: Not applicable                            Cognition Arousal: Alert Behavior During Therapy: WFL for tasks assessed/performed, Anxious Overall Cognitive Status: No family/caregiver present to determine baseline cognitive functioning  General Comments: Pt perseverating on having to choose a SNF and reluctant to participate, refusing OOB but agreeable to bed-level repositioning and education with encouragement.        Exercises Amputee Exercises Quad Sets: AROM, Both, Supine, 5 reps Gluteal Sets: AROM, 5 reps, Supine Knee Flexion: AROM, Both (a few reps  with repositioning) Knee Extension: AROM, Both (a few reps with repositioning) Chair Push Up:  (verbal/visual demo, pt defers to attempt today) Other Exercises Other Exercises: pt refusing sidelying or prone hip exercises this date due to c/o fatigue    General Comments General comments (skin integrity, edema, etc.): Extensive instruction on positioning and likely timeline for wound healing/preparation for prosthetic. On HEP handout, PTA included visual handout on limb wrapping for pt to review. [email or web address]   Access Code: [email or web address]      Pertinent Vitals/Pain Pain Assessment Pain Assessment: Faces Faces Pain Scale: Hurts little more Pain Location: bottom and scrotal area with pressure when positioning Pain Descriptors / Indicators: Guarding, Grimacing, Discomfort Pain Intervention(s): Limited activity within patient's tolerance, Monitored during session, Repositioned (sidelying to his R and pillow behind L hip/bottom)    Home Living                          Prior Function            PT Goals (current goals can now be found in the care plan section) Acute Rehab PT Goals Patient Stated Goal: to go home and work toward bil prosthetic limbs/walking PT Goal Formulation: With patient Time For Goal Achievement: 09/02/23 Progress towards PT goals: Progressing toward goals (slowly)    Frequency    Min 1X/week      PT Plan      Co-evaluation              AM-PAC PT 6 Clicks Mobility   Outcome Measure  Help needed turning from your back to your side while in a flat bed without using bedrails?: A Little Help needed moving from lying on your back to sitting on the side of a flat bed without using bedrails?: A Lot (w/o rails) Help needed moving to and from a bed to a chair (including a wheelchair)?: A Lot Help needed standing up from a chair using your arms (e.g., wheelchair or bedside chair)?: Total Help needed to walk in hospital room?:  Total Help needed climbing 3-5 steps with a railing? : Total 6 Click Score: 10    End of Session Equipment Utilized During Treatment: Other (comment) (bed pad assist) Activity Tolerance: Patient limited by fatigue Patient left: in bed;with call bell/phone within reach;with bed alarm set;Other (comment) (sidelying toward his R side) Nurse Communication: Mobility status;Other (comment) (refusing OOB to [facility] or chair) PT Visit Diagnosis: Other abnormalities of gait and mobility (R26.89)     Time: 1145 (-5 mins out to make up HEP program)-1209 PT Time Calculation (min) (ACUTE ONLY): 24 min  Charges:    $Therapeutic Activity: 8-22 mins PT General Charges $$ ACUTE PT VISIT: 1 Visit                     Bernie Ransford P., PTA Acute Rehabilitation Services Secure Chat Preferred 9a-5:30pm Office: [phone]    Connell HERO [facility] 09/01/2023, 2:02 PM

## 2023-09-01 NOTE — Plan of Care (Signed)
  Problem: Education: Goal: Ability to describe self-care measures that may prevent or decrease complications (Diabetes Survival Skills Education) will improve Outcome: Progressing Goal: Individualized Educational Video(s) Outcome: Progressing   Problem: Coping: Goal: Ability to adjust to condition or change in health will improve Outcome: Progressing   Problem: Fluid Volume: Goal: Ability to maintain a balanced intake and output will improve Outcome: Progressing   Problem: Health Behavior/Discharge Planning: Goal: Ability to identify and utilize available resources and services will improve Outcome: Progressing Goal: Ability to manage health-related needs will improve Outcome: Progressing   Problem: Metabolic: Goal: Ability to maintain appropriate glucose levels will improve Outcome: Progressing   Problem: Nutritional: Goal: Maintenance of adequate nutrition will improve Outcome: Progressing Goal: Progress toward achieving an optimal weight will improve Outcome: Progressing   Problem: Skin Integrity: Goal: Risk for impaired skin integrity will decrease Outcome: Progressing   Problem: Tissue Perfusion: Goal: Adequacy of tissue perfusion will improve Outcome: Progressing   Problem: Education: Goal: Knowledge of General Education information will improve Description: Including pain rating scale, medication(s)/side effects and non-pharmacologic comfort measures Outcome: Progressing   Problem: Health Behavior/Discharge Planning: Goal: Ability to manage health-related needs will improve Outcome: Progressing   Problem: Clinical Measurements: Goal: Ability to maintain clinical measurements within normal limits will improve Outcome: Progressing Goal: Will remain free from infection Outcome: Progressing Goal: Diagnostic test results will improve Outcome: Progressing Goal: Respiratory complications will improve Outcome: Progressing Goal: Cardiovascular complication will  be avoided Outcome: Progressing   Problem: Activity: Goal: Risk for activity intolerance will decrease Outcome: Progressing   Problem: Nutrition: Goal: Adequate nutrition will be maintained Outcome: Progressing   Problem: Coping: Goal: Level of anxiety will decrease Outcome: Progressing   Problem: Elimination: Goal: Will not experience complications related to bowel motility Outcome: Progressing Goal: Will not experience complications related to urinary retention Outcome: Progressing   Problem: Pain Management: Goal: General experience of comfort will improve Outcome: Progressing   Problem: Safety: Goal: Ability to remain free from injury will improve Outcome: Progressing   Problem: Skin Integrity: Goal: Risk for impaired skin integrity will decrease Outcome: Progressing   Problem: Education: Goal: Knowledge of the prescribed therapeutic regimen will improve Outcome: Progressing Goal: Ability to verbalize activity precautions or restrictions will improve Outcome: Progressing Goal: Understanding of discharge needs will improve Outcome: Progressing   Problem: Activity: Goal: Ability to perform//tolerate increased activity and mobilize with assistive devices will improve Outcome: Progressing   Problem: Clinical Measurements: Goal: Postoperative complications will be avoided or minimized Outcome: Progressing   Problem: Self-Care: Goal: Ability to meet self-care needs will improve Outcome: Progressing   Problem: Self-Concept: Goal: Ability to maintain and perform role responsibilities to the fullest extent possible will improve Outcome: Progressing   Problem: Pain Management: Goal: Pain level will decrease with appropriate interventions Outcome: Progressing   Problem: Skin Integrity: Goal: Demonstration of wound healing without infection will improve Outcome: Progressing

## 2023-09-01 NOTE — Progress Notes (Signed)
 PROGRESS NOTE  [name] FMW:[id number] DOB: [birth year] DOA: 08/07/2023 PCP: Gretta Lacinda Browning, MD   LOS: 25 days   Brief Narrative / Interim history: Earl Calderon is a 72 y.o. male with PMH significant for DM2, HTN, HLD, CAD/PCI, combined systolic and diastolic CHF, CKD, MGUS, 12/6, patient presented to the ED with complaint of shortness of breath.  He also was having bilateral lower extremity swelling and worsening of his wounds.  He was admitted for CHF exacerbation, placed on diuretics and vascular surgery was consulted for his wounds, underwent aortogram and eventually underwent bilateral BKA  Subjective / 24h Interval events: Doing well overall well.  Has not picked up a place yet  Assesement and Plan: Principal Problem:   Acute respiratory failure with hypoxia (HCC) Active Problems:   Chronic combined systolic and diastolic heart failure (HCC)   Essential hypertension   Acute renal failure superimposed on stage 3a chronic kidney disease (HCC)   Acute postoperative anemia due to expected blood loss   Gangrene of both feet (HCC)   Hypoxia   S/P bilateral BKA (below knee amputation) (HCC) - on 08/14/2023   Delirium   Type 2 diabetes mellitus with chronic kidney disease, with long-term current use of insulin  (HCC)   MGUS (monoclonal gammopathy of unknown significance)   Diabetes mellitus with foot ulcer and gangrene (HCC)   Ulcer of both feet, limited to breakdown of skin (HCC)   Principal problem Acute on chronic combined CHF exacerbation -patient initially required IV diuresis.  Prior echos had an EF as low as 40 to 45%, current EF seems recovered at 55 to 60%. -Close to euvolemic now, placed on oral furosemide .  Continue Coreg , Imdur , Entresto , spironolactone .  Stable  Active problems Acute hypoxic respiratory failure-resolved, tolerating room air and appears comfortable  PAD, bilateral feet gangrene-status post bilateral BKA on 12/13 by Dr. Harden.   Currently on aspirin   Hypomagnesemia, hypokalemia-replace magnesium  and K as indicated, continue to monitor  Acute kidney injury on CKD 3A - baseline creatinine around 1.3, peak was 6.2.  Nephrology was consulted.  Now back to baseline  Acute blood loss anemia-postoperatively, status post 5 units of packed red blood cells with last transfusion 12/22.  Hemoglobin stable  Acute metabolic encephalopathy-continue Seroquel , possibly hospital induced, alert and appropriate this morning  MGUS-seeing Dr. Timmy as an outpatient  DM2, uncontrolled, with hyperglycemia-continue Levemir , sliding scale   Scheduled Meds:  vitamin C   1,000 mg Oral Daily   aspirin  EC  81 mg Oral Daily   carvedilol   3.125 mg Oral BID WC   enoxaparin  (LOVENOX ) injection  40 mg Subcutaneous Q24H   furosemide   40 mg Oral Daily   insulin  aspart  0-6 Units Subcutaneous TID WC   insulin  detemir  8 Units Subcutaneous BID   iron  polysaccharides  150 mg Oral Daily   isosorbide  dinitrate  20 mg Oral BID   lidocaine   1 patch Transdermal Q24H   nutrition supplement (JUVEN)  1 packet Oral BID BM   QUEtiapine   25 mg Oral BID   sacubitril -valsartan   1 tablet Oral BID   sodium chloride  flush  3 mL Intravenous Q12H   spironolactone   25 mg Oral Daily   Continuous Infusions: PRN Meds:.acetaminophen  **OR** acetaminophen , labetalol , LORazepam  **OR** LORazepam , ondansetron  (ZOFRAN ) IV, oxyCODONE   Current Outpatient Medications  Medication Instructions   APPLE CIDER VINEGAR PO 1 tablet, Oral, Daily   ascorbic acid  (VITAMIN C ) 1,000 mg, Oral, Daily, Take with iron  pill   aspirin  EC (ASPIRIN  LOW  DOSE) 81 mg, Oral, Daily, Swallow whole.   [Paused] carvedilol  (COREG ) 6.25 mg, Oral, 2 times daily with meals   carvedilol  (COREG ) 3.125 mg, Oral, 2 times daily with meals   ELDERBERRY PO 100 mg, Oral, Daily   Ergocalciferol (VITAMIN D2 PO) 2 tablets, Oral, Daily   ferrous sulfate  325 mg, Oral, Daily with breakfast   [Paused] furosemide   (LASIX ) 40 MG tablet TAKE 1 TABLET BY MOUTH ONCE DAILY   glucose blood (FREESTYLE TEST STRIPS) test strip Use as instructed   HUMALOG KWIKPEN 100 UNIT/ML KwikPen Subcutaneous, Sliding Scale   hydrALAZINE  (APRESOLINE ) 25 mg, Oral, 3 times daily   insulin  aspart (NOVOLOG ) 0-6 Units, Subcutaneous, 3 times daily with meals   insulin  detemir (LEVEMIR ) 10 Units, Subcutaneous, 2 times daily, Hold if blood sugar less than 100   Insulin  Pen Needle (NOVOFINE PEN NEEDLE) 32G X 6 MM MISC USE AS DIRECTED   isosorbide  dinitrate (ISORDIL ) 20 mg, Oral, 2 times daily   JARDIANCE  10 MG TABS tablet TAKE ONE TABLET BY MOUTH DAILY BEFORE BREAKFAST   Levemir  FlexTouch 8 Units, Subcutaneous, 2 times daily   nitroGLYCERIN  (NITROSTAT ) 0.4 mg, Sublingual, Every 5 min PRN   Omega-3 Fatty Acids (FISH OIL PO) 1 tablet, Oral, Daily   Probiotic Product (PROBIOTIC MULTI-ENZYME) TABS 3 tablets, Oral, Daily   QUEtiapine  (SEROQUEL ) 25 mg, Oral, Daily with breakfast   QUEtiapine  (SEROQUEL ) 50 mg, Oral, Every evening, Around 8 pm   rosuvastatin  (CRESTOR ) 40 MG tablet TAKE 1 TABLET BY MOUTH ONCE DAILY AT 6PM   [Paused] sacubitril -valsartan  (ENTRESTO ) 97-103 MG 1 tablet, Oral, 2 times daily   [Paused] spironolactone  (ALDACTONE ) 25 MG tablet TAKE 1 TABLET BY MOUTH ONCE DAILY   Turmeric 500 MG TABS 2 tablets, Oral, Daily   Zinc  Sulfate 220 mg, Oral, Daily    Diet Orders (From admission, onward)     Start     Ordered   08/23/23 0000  Diet - low sodium heart healthy        08/23/23 1545   08/14/23 1124  Diet Carb Modified Fluid consistency: Thin; Room service appropriate? Yes  Diet effective now       Question Answer Comment  Calorie Level Medium 1600-2000   Fluid consistency: Thin   Room service appropriate? Yes      08/14/23 1123            DVT prophylaxis: enoxaparin  (LOVENOX ) injection 40 mg Start: 08/19/23 1000   Lab Results  Component Value Date   PLT 218 09/01/2023      Code Status: Full Code  Family  Communication: no family at bedside   Status is: Inpatient Remains inpatient appropriate because: severity of illness, SNF placement pending  Level of care: Telemetry Medical  Consultants:  Vascular surgery  Orthopedics  Objective: Vitals:   08/31/23 2057 09/01/23 0519 09/01/23 0526 09/01/23 0718  BP: (!) 154/64 (!) 160/75 (!) 166/82 (!) 186/79  Pulse: 63 66 67 65  Resp: 18 18 18    Temp: 98.4 F (36.9 C) 98.9 F (37.2 C) 97.8 F (36.6 C) 98.1 F (36.7 C)  TempSrc: Oral Oral  Oral  SpO2: 96% 100% 100% 92%  Weight:      Height:        Intake/Output Summary (Last 24 hours) at 09/01/2023 1105 Last data filed at 09/01/2023 0500 Gross per 24 hour  Intake 3 ml  Output 200 ml  Net -197 ml   Wt Readings from Last 3 Encounters:  08/14/23 68 kg  03/12/23 65 kg  12/29/22 66.1 kg    Examination:  Constitutional: NAD Eyes: lids and conjunctivae normal, no scleral icterus ENMT: mmm Neck: normal, supple Respiratory: clear to auscultation bilaterally, no wheezing, no crackles. Normal respiratory effort.  Cardiovascular: Regular rate and rhythm, no murmurs / rubs / gallops. No LE edema. Abdomen: soft, no distention, no tenderness. Bowel sounds positive.   Data Reviewed: I have independently reviewed following labs and imaging studies   CBC Recent Labs  Lab 08/26/23 0536 08/27/23 0523 09/01/23 0503  WBC 7.7 9.3 4.3  HGB 9.0* 9.8* 9.2*  HCT 28.2* 31.0* 28.4*  PLT 235 255 218  MCV 93.1 93.7 93.4  MCH 29.7 29.6 30.3  MCHC 31.9 31.6 32.4  RDW 18.4* 17.8* 16.8*  LYMPHSABS 1.0 1.0  --   MONOABS 0.5 0.6  --   EOSABS 0.2 0.3  --   BASOSABS 0.0 0.0  --     Recent Labs  Lab 08/26/23 0536 08/27/23 0523 08/28/23 0605 08/30/23 0445 09/01/23 0503  NA 138 141  --  140 138  K 4.0 3.7  --  3.1* 3.6  CL 101 103  --  101 100  CO2 30 30  --  30 30  GLUCOSE 178* 134*  --  119* 137*  BUN 35* 34*  --  35* 39*  CREATININE 1.19 1.01 1.08 1.00 0.98  CALCIUM  8.2* 8.4*  --   8.2* 8.0*  AST  --   --   --   --  13*  ALT  --   --   --   --  15  ALKPHOS  --   --   --   --  52  BILITOT  --   --   --   --  0.4  ALBUMIN   --   --   --   --  1.8*  MG  --   --   --   --  1.6*    ------------------------------------------------------------------------------------------------------------------ No results for input(s): CHOL, HDL, LDLCALC, TRIG, CHOLHDL, LDLDIRECT in the last 72 hours.  Lab Results  Component Value Date   HGBA1C 9.1 (H) 08/07/2023   ------------------------------------------------------------------------------------------------------------------ No results for input(s): TSH, T4TOTAL, T3FREE, THYROIDAB in the last 72 hours.  Invalid input(s): FREET3  Cardiac Enzymes No results for input(s): CKMB, TROPONINI, MYOGLOBIN in the last 168 hours.  Invalid input(s): CK ------------------------------------------------------------------------------------------------------------------    Component Value Date/Time   BNP 103.4 (H) 08/22/2023 1022    CBG: Recent Labs  Lab 08/31/23 0920 08/31/23 1214 08/31/23 1631 08/31/23 2058 09/01/23 0523  GLUCAP 183* 150* 296* 182* 149*    No results found for this or any previous visit (from the past 240 hours).   Radiology Studies: No results found.   Nilda Fendt, MD, PhD [facility]  Between 7 am - 7 pm I am available, please contact me via Amion (for emergencies) or Securechat (non urgent messages)  Between 7 pm - 7 am I am not available, please contact night coverage MD/APP via Amion

## 2023-09-02 LAB — COMPREHENSIVE METABOLIC PANEL
ALT: 9 U/L (ref 0–44)
AST: 14 U/L — ABNORMAL LOW (ref 15–41)
Albumin: 2 g/dL — ABNORMAL LOW (ref 3.5–5.0)
Alkaline Phosphatase: 55 U/L (ref 38–126)
Anion gap: 7 (ref 5–15)
BUN: 37 mg/dL — ABNORMAL HIGH (ref 8–23)
CO2: 29 mmol/L (ref 22–32)
Calcium: 8.2 mg/dL — ABNORMAL LOW (ref 8.9–10.3)
Chloride: 102 mmol/L (ref 98–111)
Creatinine, Ser: 0.93 mg/dL (ref 0.61–1.24)
GFR, Estimated: >60 mL/min (ref >60)
Glucose, Bld: 129 mg/dL — ABNORMAL HIGH (ref 70–99)
Potassium: 3.7 mmol/L (ref 3.5–5.1)
Sodium: 138 mmol/L (ref 135–145)
Total Bilirubin: 0.7 mg/dL (ref 0.0–1.2)
Total Protein: 6.4 g/dL — ABNORMAL LOW (ref 6.5–8.1)

## 2023-09-02 LAB — GLUCOSE, CAPILLARY
Glucose-Capillary: 192 mg/dL — ABNORMAL HIGH (ref 70–99)
Glucose-Capillary: 241 mg/dL — ABNORMAL HIGH (ref 70–99)

## 2023-09-02 LAB — CBC
HCT: 30.7 % — ABNORMAL LOW (ref 39.0–52.0)
Hemoglobin: 9.8 g/dL — ABNORMAL LOW (ref 13.0–17.0)
MCH: 29.9 pg (ref 26.0–34.0)
MCHC: 31.9 g/dL (ref 30.0–36.0)
MCV: 93.6 fL (ref 80.0–100.0)
Platelets: 232 10*3/uL (ref 150–400)
RBC: 3.28 MIL/uL — ABNORMAL LOW (ref 4.22–5.81)
RDW: 16.4 % — ABNORMAL HIGH (ref 11.5–15.5)
WBC: 4 10*3/uL (ref 4.0–10.5)
nRBC: 0 % (ref 0.0–0.2)

## 2023-09-02 LAB — MAGNESIUM: Magnesium: 1.7 mg/dL (ref 1.7–2.4)

## 2023-09-02 NOTE — TOC Progression Note (Addendum)
 Transition of Care [facility]) - Progression Note    Patient Details  Name: Earl Calderon MRN: [id number] Date of Birth: Nov 05, 1950  [facility]) CM/SW Contact  Bridget Cordella Simmonds, LCSW Phone Number: 09/02/2023, 8:40 AM  Clinical Narrative:   SNF auth request remains pending in [place].   Message from [facility]: unable to accept pt today due to pharmacy closed.    1310: SNF auth approved: [id number], [id number], 4 days: 12/31-1/3.    Expected Discharge Plan: Home w Home Health Services Barriers to Discharge: Continued Medical Work up  Expected Discharge Plan and Services In-house Referral: Clinical Social Work Discharge Planning Services: CM Consult                     DME Arranged: Other see comment (hoyer lift) DME Agency: [employer] Date DME Agency Contacted: 08/21/23 Time DME Agency Contacted: [phone] Representative spoke with at DME Agency: Darlyn HH Arranged: PT, OT HH Agency: [facility] Date [facility] Agency Contacted: 08/20/23 Time HH Agency Contacted: 1530 Representative spoke with at [facility] Agency: Amy   Social Determinants of Health (SDOH) Interventions SDOH Screenings   Food Insecurity: No Food Insecurity (08/07/2023)  Housing: Low Risk  (08/07/2023)  Transportation Needs: No Transportation Needs (08/07/2023)  Utilities: Not At Risk (08/07/2023)  Depression (PHQ2-9): Low Risk  (10/18/2020)  Financial Resource Strain: Medium Risk (09/21/2020)  Social Connections: Patient Declined (09/01/2023)  Tobacco Use: Low Risk  (08/14/2023)    Readmission Risk Interventions     No data to display

## 2023-09-02 NOTE — Progress Notes (Signed)
 Pt noted with episodes x2 of ST depression >59mV x 4 leads, SB-heart rate running in mid 50s-low 60s. BP-167/88. Pt irritable with assessment (so limited info obtained) however denies any symptoms. Pt did have to have potassium and mag replacement yesterday. Pt has labs ordered this am. Pt admitted initially for acute resp failure and then subsequent wound infection with bilateral leg amputations. Earl Calderon made aware, no new orders at this time.

## 2023-09-02 NOTE — Progress Notes (Signed)
 PROGRESS NOTE  Earl Calderon FMW:[id number] DOB: [birth year] DOA: 08/07/2023 PCP: Earl Lacinda Browning, MD   LOS: 26 days   Brief Narrative / Interim history: Earl Calderon is a 73 y.o. male with PMH significant for DM2, HTN, HLD, CAD/PCI, combined systolic and diastolic CHF, CKD, MGUS, 12/6, patient presented to the ED with complaint of shortness of breath.  He also was having bilateral lower extremity swelling and worsening of his wounds.  He was admitted for CHF exacerbation, placed on diuretics and vascular surgery was consulted for his wounds, underwent aortogram and eventually underwent bilateral BKA  Subjective / 24h Interval events: Awaiting SNF, no complaints.  Assesement and Plan: Principal Problem:   Acute respiratory failure with hypoxia (HCC) Active Problems:   Chronic combined systolic and diastolic heart failure (HCC)   Essential hypertension   Acute renal failure superimposed on stage 3a chronic kidney disease (HCC)   Acute postoperative anemia due to expected blood loss   Gangrene of both feet (HCC)   Hypoxia   S/P bilateral BKA (below knee amputation) (HCC) - on 08/14/2023   Delirium   Type 2 diabetes mellitus with chronic kidney disease, with long-term current use of insulin  (HCC)   MGUS (monoclonal gammopathy of unknown significance)   Diabetes mellitus with foot ulcer and gangrene (HCC)   Ulcer of both feet, limited to breakdown of skin (HCC)   Principal problem Acute on chronic combined CHF exacerbation -patient initially required IV diuresis.  Prior echos had an EF as low as 40 to 45%, current EF seems recovered at 55 to 60%. -Close to euvolemic now, placed on oral furosemide .  Continue Coreg , Imdur , Entresto , spironolactone .  Stable -Can go to SNF, however facility cannot accept until tomorrow  Active problems Acute hypoxic respiratory failure-resolved, tolerating room air and appears comfortable  PAD, bilateral feet gangrene-status post  bilateral BKA on 12/13 by Dr. Harden.  Currently on aspirin   Hypomagnesemia, hypokalemia-replace magnesium  and K as indicated, continue to monitor  Acute kidney injury on CKD 3A - baseline creatinine around 1.3, peak was 6.2.  Nephrology was consulted.  Now back to baseline  Acute blood loss anemia-postoperatively, status post 5 units of packed red blood cells with last transfusion 12/22.  Hemoglobin stable  Acute metabolic encephalopathy-continue Seroquel , possibly hospital induced, alert and appropriate this morning  MGUS-seeing Dr. Timmy as an outpatient  DM2, uncontrolled, with hyperglycemia-continue Levemir , sliding scale   Scheduled Meds:  vitamin C   1,000 mg Oral Daily   aspirin  EC  81 mg Oral Daily   carvedilol   3.125 mg Oral BID WC   enoxaparin  (LOVENOX ) injection  40 mg Subcutaneous Q24H   furosemide   40 mg Oral Daily   insulin  aspart  0-6 Units Subcutaneous TID WC   insulin  detemir  8 Units Subcutaneous BID   iron  polysaccharides  150 mg Oral Daily   isosorbide  dinitrate  20 mg Oral BID   lidocaine   1 patch Transdermal Q24H   nutrition supplement (JUVEN)  1 packet Oral BID BM   sacubitril -valsartan   1 tablet Oral BID   sodium chloride  flush  3 mL Intravenous Q12H   spironolactone   25 mg Oral Daily   Continuous Infusions: PRN Meds:.acetaminophen  **OR** acetaminophen , labetalol , LORazepam  **OR** LORazepam , ondansetron  (ZOFRAN ) IV, oxyCODONE   Current Outpatient Medications  Medication Instructions   APPLE CIDER VINEGAR PO 1 tablet, Oral, Daily   ascorbic acid  (VITAMIN C ) 1,000 mg, Oral, Daily, Take with iron  pill   aspirin  EC (ASPIRIN  LOW DOSE) 81 mg, Oral, Daily, Swallow  whole.   [Paused] carvedilol  (COREG ) 6.25 mg, Oral, 2 times daily with meals   carvedilol  (COREG ) 3.125 mg, Oral, 2 times daily with meals   ELDERBERRY PO 100 mg, Oral, Daily   Ergocalciferol (VITAMIN D2 PO) 2 tablets, Oral, Daily   ferrous sulfate  325 mg, Oral, Daily with breakfast   [Paused]  furosemide  (LASIX ) 40 MG tablet TAKE 1 TABLET BY MOUTH ONCE DAILY   glucose blood (FREESTYLE TEST STRIPS) test strip Use as instructed   HUMALOG KWIKPEN 100 UNIT/ML KwikPen Subcutaneous, Sliding Scale   hydrALAZINE  (APRESOLINE ) 25 mg, Oral, 3 times daily   insulin  aspart (NOVOLOG ) 0-6 Units, Subcutaneous, 3 times daily with meals   insulin  detemir (LEVEMIR ) 10 Units, Subcutaneous, 2 times daily, Hold if blood sugar less than 100   Insulin  Pen Needle (NOVOFINE PEN NEEDLE) 32G X 6 MM MISC USE AS DIRECTED   isosorbide  dinitrate (ISORDIL ) 20 mg, Oral, 2 times daily   JARDIANCE  10 MG TABS tablet TAKE ONE TABLET BY MOUTH DAILY BEFORE BREAKFAST   Levemir  FlexTouch 8 Units, Subcutaneous, 2 times daily   nitroGLYCERIN  (NITROSTAT ) 0.4 mg, Sublingual, Every 5 min PRN   Omega-3 Fatty Acids (FISH OIL PO) 1 tablet, Oral, Daily   Probiotic Product (PROBIOTIC MULTI-ENZYME) TABS 3 tablets, Oral, Daily   QUEtiapine  (SEROQUEL ) 25 mg, Oral, Daily with breakfast   QUEtiapine  (SEROQUEL ) 50 mg, Oral, Every evening, Around 8 pm   rosuvastatin  (CRESTOR ) 40 MG tablet TAKE 1 TABLET BY MOUTH ONCE DAILY AT 6PM   [Paused] sacubitril -valsartan  (ENTRESTO ) 97-103 MG 1 tablet, Oral, 2 times daily   [Paused] spironolactone  (ALDACTONE ) 25 MG tablet TAKE 1 TABLET BY MOUTH ONCE DAILY   Turmeric 500 MG TABS 2 tablets, Oral, Daily   Zinc  Sulfate 220 mg, Oral, Daily    Diet Orders (From admission, onward)     Start     Ordered   08/23/23 0000  Diet - low sodium heart healthy        08/23/23 1545   08/14/23 1124  Diet Carb Modified Fluid consistency: Thin; Room service appropriate? Yes  Diet effective now       Question Answer Comment  Calorie Level Medium 1600-2000   Fluid consistency: Thin   Room service appropriate? Yes      08/14/23 1123            DVT prophylaxis: enoxaparin  (LOVENOX ) injection 40 mg Start: 08/19/23 1000   Lab Results  Component Value Date   PLT 232 09/02/2023      Code Status: Full  Code  Family Communication: no family at bedside   Status is: Inpatient Remains inpatient appropriate because: severity of illness, SNF placement pending  Level of care: Med-Surg  Consultants:  Vascular surgery  Orthopedics  Objective: Vitals:   09/01/23 2057 09/01/23 2158 09/02/23 0619 09/02/23 0718  BP: (!) 193/78 (!) 165/77 (!) 167/88 (!) 147/63  Pulse: 62  61 62  Resp: 16 12 15 16   Temp: 97.7 F (36.5 C)  98 F (36.7 C) 98 F (36.7 C)  TempSrc: Oral  Oral Oral  SpO2: 100%  99% 98%  Weight:      Height:        Intake/Output Summary (Last 24 hours) at 09/02/2023 1052 Last data filed at 09/01/2023 2059 Gross per 24 hour  Intake 720 ml  Output 300 ml  Net 420 ml   Wt Readings from Last 3 Encounters:  08/14/23 68 kg  03/12/23 65 kg  12/29/22 66.1 kg  Examination:  Constitutional: NAD  Data Reviewed: I have independently reviewed following labs and imaging studies   CBC Recent Labs  Lab 08/27/23 0523 09/01/23 0503 09/02/23 0755  WBC 9.3 4.3 4.0  HGB 9.8* 9.2* 9.8*  HCT 31.0* 28.4* 30.7*  PLT 255 218 232  MCV 93.7 93.4 93.6  MCH 29.6 30.3 29.9  MCHC 31.6 32.4 31.9  RDW 17.8* 16.8* 16.4*  LYMPHSABS 1.0  --   --   MONOABS 0.6  --   --   EOSABS 0.3  --   --   BASOSABS 0.0  --   --     Recent Labs  Lab 08/27/23 0523 08/28/23 0605 08/30/23 0445 09/01/23 0503 09/02/23 0755  NA 141  --  140 138 138  K 3.7  --  3.1* 3.6 3.7  CL 103  --  101 100 102  CO2 30  --  30 30 29   GLUCOSE 134*  --  119* 137* 129*  BUN 34*  --  35* 39* 37*  CREATININE 1.01 1.08 1.00 0.98 0.93  CALCIUM  8.4*  --  8.2* 8.0* 8.2*  AST  --   --   --  13* 14*  ALT  --   --   --  15 9  ALKPHOS  --   --   --  52 55  BILITOT  --   --   --  0.4 0.7  ALBUMIN   --   --   --  1.8* 2.0*  MG  --   --   --  1.6* 1.7    ------------------------------------------------------------------------------------------------------------------ No results for input(s): CHOL, HDL,  LDLCALC, TRIG, CHOLHDL, LDLDIRECT in the last 72 hours.  Lab Results  Component Value Date   HGBA1C 9.1 (H) 08/07/2023   ------------------------------------------------------------------------------------------------------------------ No results for input(s): TSH, T4TOTAL, T3FREE, THYROIDAB in the last 72 hours.  Invalid input(s): FREET3  Cardiac Enzymes No results for input(s): CKMB, TROPONINI, MYOGLOBIN in the last 168 hours.  Invalid input(s): CK ------------------------------------------------------------------------------------------------------------------    Component Value Date/Time   BNP 103.4 (H) 08/22/2023 1022    CBG: Recent Labs  Lab 08/31/23 2058 09/01/23 0523 09/01/23 1116 09/01/23 1633 09/01/23 2102  GLUCAP 182* 149* 189* 190* 192*    No results found for this or any previous visit (from the past 240 hours).   Radiology Studies: No results found.   Nilda Fendt, MD, PhD [facility]  Between 7 am - 7 pm I am available, please contact me via Amion (for emergencies) or Securechat (non urgent messages)  Between 7 pm - 7 am I am not available, please contact night coverage MD/APP via Amion

## 2023-09-03 ENCOUNTER — Other Ambulatory Visit (HOSPITAL_COMMUNITY): Payer: Self-pay

## 2023-09-03 LAB — GLUCOSE, CAPILLARY
Glucose-Capillary: 179 mg/dL — ABNORMAL HIGH (ref 70–99)
Glucose-Capillary: 208 mg/dL — ABNORMAL HIGH (ref 70–99)

## 2023-09-03 MED ORDER — INSULIN GLARGINE-YFGN 100 UNIT/ML ~~LOC~~ SOLN
8.0000 [IU] | Freq: Two times a day (BID) | SUBCUTANEOUS | Status: DC
  Filled 2023-09-03: qty 0.08

## 2023-09-03 MED ORDER — HYDRALAZINE HCL 20 MG/ML IJ SOLN
10.0000 mg | Freq: Once | INTRAMUSCULAR | Status: AC
  Administered 2023-09-03: 10 mg via INTRAVENOUS
  Filled 2023-09-03: qty 1

## 2023-09-03 MED ORDER — FUROSEMIDE 40 MG PO TABS: 40.0000 mg | ORAL_TABLET | Freq: Every day | ORAL | Status: AC

## 2023-09-03 MED ORDER — HYDRALAZINE HCL 50 MG PO TABS
50.0000 mg | ORAL_TABLET | Freq: Three times a day (TID) | ORAL | Status: DC
  Administered 2023-09-03: 50 mg via ORAL
  Filled 2023-09-03: qty 1

## 2023-09-03 MED ORDER — HYDRALAZINE HCL 50 MG PO TABS: 50.0000 mg | ORAL_TABLET | Freq: Three times a day (TID) | ORAL | Status: AC

## 2023-09-03 MED ORDER — SPIRONOLACTONE 25 MG PO TABS: 25.0000 mg | ORAL_TABLET | Freq: Every day | ORAL | Status: DC

## 2023-09-03 MED ORDER — SACUBITRIL-VALSARTAN 97-103 MG PO TABS: 1.0000 | ORAL_TABLET | Freq: Two times a day (BID) | ORAL | Status: AC

## 2023-09-03 NOTE — Progress Notes (Signed)
 Report called to facility [facility] LPN [phone]

## 2023-09-03 NOTE — TOC Transition Note (Signed)
 Transition of Care [facility]) - Discharge Note   Patient Details  Name: Earl Calderon MRN: [id number] Date of Birth: [birth year]  [facility] CM/SW Contact:  Bridget Cordella Simmonds, LCSW Phone Number: 09/03/2023, 11:22 AM   Clinical Narrative:   Pt discharging to [facility].  RN call report to [phone].    0830: CSW confirmed with [facility] that they can receive pt today.   Final next level of care: Skilled Nursing Facility Barriers to Discharge: Barriers Resolved   Patient Goals and CMS Choice     Choice offered to / list presented to : Patient      Discharge Placement              Patient chooses bed at:  [facility]) Patient to be transferred to facility by: [facility] Name of family member notified: left message with wife Adams Patient and family notified of of transfer: 09/03/23  Discharge Plan and Services Additional resources added to the After Visit Summary for   In-house Referral: Clinical Social Work Discharge Planning Services: CM Consult            DME Arranged: Other see comment (hoyer lift) DME Agency: [employer] Date DME Agency Contacted: 08/21/23 Time DME Agency Contacted: [phone] Representative spoke with at DME Agency: Darlyn HH Arranged: PT, OT HH Agency: [facility] Date [facility] Agency Contacted: 08/20/23 Time HH Agency Contacted: 1530 Representative spoke with at [facility] Agency: Amy  Social Drivers of Health (SDOH) Interventions SDOH Screenings   Food Insecurity: No Food Insecurity (08/07/2023)  Housing: Low Risk  (08/07/2023)  Transportation Needs: No Transportation Needs (08/07/2023)  Utilities: Not At Risk (08/07/2023)  Depression (PHQ2-9): Low Risk  (10/18/2020)  Financial Resource Strain: Medium Risk (09/21/2020)  Social Connections: Patient Declined (09/01/2023)  Tobacco Use: Low Risk  (08/14/2023)     Readmission Risk Interventions     No data to display

## 2023-09-03 NOTE — Discharge Summary (Signed)
 Physician Discharge Summary  Earl Calderon FMW:[id number] DOB: 12-24-1950 DOA: 08/07/2023  PCP: Gretta Lacinda Browning, MD  Admit date: 08/07/2023 Discharge date: 09/03/2023  Admitted From: home Disposition:  SNF  Recommendations for Outpatient Follow-up:  Follow up with PCP in 1-2 weeks Follow-up with Dr. Harden in 1 to 2 weeks  Home Health: none Equipment/Devices: none  Discharge Condition: stable CODE STATUS: Full code Diet Orders (From admission, onward)     Start     Ordered   08/23/23 0000  Diet - low sodium heart healthy        08/23/23 1545   08/14/23 1124  Diet Carb Modified Fluid consistency: Thin; Room service appropriate? Yes  Diet effective now       Question Answer Comment  Calorie Level Medium 1600-2000   Fluid consistency: Thin   Room service appropriate? Yes      08/14/23 1123            Brief Narrative / Interim history: Earl Calderon is a 73 y.o. male with PMH significant for DM2, HTN, HLD, CAD/PCI, combined systolic and diastolic CHF, CKD, MGUS, 12/6, patient presented to the ED with complaint of shortness of breath.  He also was having bilateral lower extremity swelling and worsening of his wounds.  He was admitted for CHF exacerbation, placed on diuretics and vascular surgery was consulted for his wounds, underwent aortogram and eventually underwent bilateral BKA  Hospital Course / Discharge diagnoses: Principal Problem:   Acute respiratory failure with hypoxia (HCC) Active Problems:   Chronic combined systolic and diastolic heart failure (HCC)   Essential hypertension   Acute renal failure superimposed on stage 3a chronic kidney disease (HCC)   Acute postoperative anemia due to expected blood loss   Gangrene of both feet (HCC)   Hypoxia   S/P bilateral BKA (below knee amputation) (HCC) - on 08/14/2023   Delirium   Type 2 diabetes mellitus with chronic kidney disease, with long-term current use of insulin  (HCC)   MGUS (monoclonal  gammopathy of unknown significance)   Diabetes mellitus with foot ulcer and gangrene (HCC)   Ulcer of both feet, limited to breakdown of skin (HCC)   Principal problem Acute on chronic combined CHF exacerbation -patient initially required IV diuresis.  Prior echos had an EF as low as 40 to 45%, current EF seems recovered at 55 to 60%. Close to euvolemic now, placed on oral furosemide .  Continue Coreg , Imdur , Entresto , spironolactone .  Stable   Active problems Acute hypoxic respiratory failure-resolved, tolerating room air and appears comfortable PAD, bilateral feet gangrene-status post bilateral BKA on 12/13 by Dr. Harden.  Currently on aspirin .  Follow-up with Dr. Harden as an outpatient Hypomagnesemia, hypokalemia-replace magnesium  and K as indicated, continue to monitor Acute kidney injury on CKD 3A - baseline creatinine around 1.3, peak was 6.2.  Nephrology was consulted.  Now back to baseline Acute blood loss anemia-postoperatively, status post 5 units of packed red blood cells with last transfusion 12/22.  Hemoglobin stable Acute metabolic encephalopathy-briefly present Seroquel , encephalopathy resolved, alert and oriented x 4, no need for Seroquel  moving forward Essential hypertension-controlled on below regimen.  Continue to monitor and adjust as indicated  MGUS-seeing Dr. Timmy as an outpatient DM2, uncontrolled, with hyperglycemia-continue home medications on discharge  Sepsis ruled out   Discharge Instructions  Discharge Instructions     Call MD for:  difficulty breathing, headache or visual disturbances   Complete by: As directed    Call MD for:  extreme fatigue  Complete by: As directed    Call MD for:  persistant dizziness or light-headedness   Complete by: As directed    Call MD for:  persistant nausea and vomiting   Complete by: As directed    Call MD for:  redness, tenderness, or signs of infection (pain, swelling, redness, odor or green/yellow discharge around  incision site)   Complete by: As directed    Call MD for:  severe uncontrolled pain   Complete by: As directed    Call MD for:  temperature >100.4   Complete by: As directed    Diet - low sodium heart healthy   Complete by: As directed    Discharge instructions   Complete by: As directed    1. Follow up with Dr. Crist office([phone]) in 4 weeks. SNF to call for appointment. 2. Follow up with [facility] Dr. Raford in 4 weeks. SNF to call for appointment. [phone] 3. For any medical emergencies, transport patient to the closest possible Emergency Room.   Discharge wound care:   Complete by: As directed    1. Perform this daily to both BKA sites.  Wash surgical sites with soap and warm water.  Pat dry. Do NOT rub dry.  Paint surgical incision site with betadine .  Apply 4 x 4 gauze plus Ace wrap and the stump shrinker on top.  Do this once a day to both left and right BKA stumps.   Increase activity slowly   Complete by: As directed       Allergies as of 09/03/2023   No Known Allergies      Medication List     STOP taking these medications    APPLE CIDER VINEGAR PO   ELDERBERRY PO   FISH OIL PO   Levemir  FlexTouch 100 UNIT/ML FlexTouch Pen Generic drug: insulin  detemir Replaced by: insulin  detemir 100 UNIT/ML injection   Novofine Pen Needle 32G X 6 MM Misc Generic drug: Insulin  Pen Needle   Turmeric 500 MG Tabs   VITAMIN D2 PO       TAKE these medications    ascorbic acid  1000 MG tablet Commonly known as: VITAMIN C  Take 1 tablet (1,000 mg total) by mouth daily. Take with iron  pill   aspirin  EC 81 MG tablet Commonly known as: Aspirin  Low Dose Take 1 tablet (81 mg total) by mouth daily. Swallow whole.   carvedilol  3.125 MG tablet Commonly known as: COREG  Take 1 tablet (3.125 mg total) by mouth 2 (two) times daily with a meal. What changed:  medication strength how much to take   ferrous sulfate  325 (65 FE) MG tablet Take 1 tablet (325 mg  total) by mouth daily with breakfast.   FREESTYLE TEST STRIPS test strip Generic drug: glucose blood Use as instructed   furosemide  40 MG tablet Commonly known as: LASIX  Take 1 tablet (40 mg total) by mouth daily. Start taking on: September 04, 2023   HumaLOG KwikPen 100 UNIT/ML KwikPen Generic drug: insulin  lispro Inject into the skin. Sliding Scale   hydrALAZINE  50 MG tablet Commonly known as: APRESOLINE  Take 1 tablet (50 mg total) by mouth every 8 (eight) hours. What changed:  medication strength See the new instructions.   insulin  aspart 100 UNIT/ML injection Commonly known as: novoLOG  Inject 0-6 Units into the skin 3 (three) times daily with meals.   insulin  detemir 100 UNIT/ML injection Commonly known as: LEVEMIR  Inject 0.1 mLs (10 Units total) into the skin 2 (two) times daily. Hold if blood  sugar less than 100 Replaces: Levemir  FlexTouch 100 UNIT/ML FlexTouch Pen   isosorbide  dinitrate 20 MG tablet Commonly known as: ISORDIL  Take 1 tablet (20 mg total) by mouth 2 (two) times daily.   Jardiance  10 MG Tabs tablet Generic drug: empagliflozin  TAKE ONE TABLET BY MOUTH DAILY BEFORE BREAKFAST   nitroGLYCERIN  0.4 MG SL tablet Commonly known as: NITROSTAT  Place 1 tablet (0.4 mg total) under the tongue every 5 (five) minutes as needed for chest pain.   Probiotic Multi-Enzyme Tabs Take 3 tablets by mouth daily at 6 (six) AM.   rosuvastatin  40 MG tablet Commonly known as: CRESTOR  TAKE 1 TABLET BY MOUTH ONCE DAILY AT 6PM   sacubitril -valsartan  97-103 MG Commonly known as: ENTRESTO  Take 1 tablet by mouth 2 (two) times daily.   spironolactone  25 MG tablet Commonly known as: ALDACTONE  Take 1 tablet (25 mg total) by mouth daily. Start taking on: September 04, 2023   Zinc  Sulfate 220 (50 Zn) MG Tabs Take 1 tablet (220 mg total) by mouth daily.       ASK your doctor about these medications    oxyCODONE  5 MG immediate release tablet Commonly known as: Oxy  IR/ROXICODONE  Take 1-2 tablets (5-10 mg total) by mouth every 4 (four) hours as needed for up to 5 days for moderate pain (pain score 4-6). Ask about: Should I take this medication?               Durable Medical Equipment  (From admission, onward)           Start     Ordered   08/21/23 1537  For home use only DME Hospital bed  Once       Question Answer Comment  Length of Need 6 Months   Patient has (list medical condition): S/P bilateral BKA (below knee amputation) on 08/14/2023   The above medical condition requires: Patient requires the ability to reposition frequently   Head must be elevated greater than: 30 degrees   Bed type Semi-electric   Hoyer Lift Yes   Support Surface: Gel Overlay      08/21/23 1537   08/21/23 1428  For home use only DME Other see comment  Once       Comments: Hoyerlift  Question:  Length of Need  Answer:  6 Months   08/21/23 1427   08/21/23 1033  For home use only DME Bedside commode  Once       Comments: CONFINE TO ONE ROOM.  Question:  Patient needs a bedside commode to treat with the following condition  Answer:  Debility   08/21/23 1033   08/20/23 1456  For home use only DME standard manual wheelchair with seat cushion  Once       Comments: Patient suffers from left BKA which impairs their ability to perform daily activities like bathing, dressing, and toileting in the home.  A walker will not resolve issue with performing activities of daily living. A wheelchair will allow patient to safely perform daily activities. Patient can safely propel the wheelchair in the home or has a caregiver who can provide assistance. Length of need Lifetime. Accessories: elevating leg rests (ELRs), wheel locks, extensions and anti-tippers.   08/20/23 1458   08/20/23 1456  For home use only DME wheelchair cushion (seat and back)  Once        08/20/23 1458              Discharge Care Instructions  (From admission, onward)  Start      Ordered   08/23/23 0000  Discharge wound care:       Comments: 1. Perform this daily to both BKA sites.  Wash surgical sites with soap and warm water.  Pat dry. Do NOT rub dry.  Paint surgical incision site with betadine .  Apply 4 x 4 gauze plus Ace wrap and the stump shrinker on top.  Do this once a day to both left and right BKA stumps.   08/23/23 1545            Follow-up Information     Harden Jerona GAILS, MD Follow up in 1 week(s).   Specialty: Orthopedic Surgery Contact information: [address] [place]  [place] [address] [phone]         Gretta Lacinda Browning, MD Follow up.   Specialty: Family Medicine Contact information: [address]  [address] [place] [address] [phone]         [facility] Follow up.   Why: [facility] will provide home health PT and OT services Contact information: [address] [place] [address] [phone]                 Consultations: Orthopedics   Procedures/Studies:  US  SCROTUM Result Date: 08/25/2023 CLINICAL DATA:  Swelling EXAM: ULTRASOUND OF SCROTUM TECHNIQUE: Complete ultrasound examination of the testicles, epididymis, and other scrotal structures was performed. COMPARISON:  None Available. FINDINGS: Right testicle Measurements: 3.9 x 2.8 x 2.4 cm. No mass or microlithiasis visualized. Left testicle Measurements: 3.7 x 2.7 x 3.4 cm. No mass or microlithiasis visualized. Right epididymis:  Normal in size and appearance. Left epididymis:  Normal in size and appearance. Hydrocele:  Trace bilateral. Varicocele:  None visualized. Other: Marked scrotal wall/skin thickening. IMPRESSION: No testicular abnormality. Trace bilateral hydroceles. Marked scrotal wall thickening. Electronically Signed   By: Franky Crease M.D.   On: 08/25/2023 20:15   ECHOCARDIOGRAM COMPLETE Result Date: 08/11/2023    ECHOCARDIOGRAM REPORT   Patient Name:   MALIKI GIGNAC Date of Exam: 08/11/2023  Medical Rec #:  [id number]         Height:       69.5 in Accession #:    [id number]        Weight:       143.0 lb Date of Birth:  [birth year]         BSA:          1.800 m Patient Age:    72 years          BP:           141/55 mmHg Patient Gender: M                 HR:           53 bpm. Exam Location:  Inpatient Procedure: 2D Echo, 3D Echo, Color Doppler, Cardiac Doppler and Strain Analysis Indications:    I27.20 Pulmonary Hypertension  History:        Patient has prior history of Echocardiogram examinations, most                 recent 01/29/2023. CAD, Arrythmias:LBBB; Risk                 Factors:Hypertension, Diabetes and Dyslipidemia.  Sonographer:    Damien Senior RDCS Referring Phys: [id number] [facility] GOEL IMPRESSIONS  1. Mildly decreased global longitudinal strain with some apical sparing, left ventricular mass calculated at  152.8g/m2. Left ventricular ejection fraction, by estimation, is 55 to 60%. Left ventricular ejection fraction by 3D volume is 55 %. The left ventricle has normal function. The left ventricle has no regional wall motion abnormalities. There is moderate concentric left ventricular hypertrophy. Left ventricular diastolic parameters are consistent with Grade II diastolic dysfunction (pseudonormalization). The average left ventricular global longitudinal strain is -13.9 %. The global longitudinal strain is abnormal.  2. Right ventricular systolic function is normal. The right ventricular size is normal. Moderately increased right ventricular wall thickness. There is normal pulmonary artery systolic pressure. The estimated right ventricular systolic pressure is 29.0 mmHg.  3. Left atrial size was severely dilated.  4. A small pericardial effusion is present. The pericardial effusion is circumferential.  5. The mitral valve is normal in structure. Trivial mitral valve regurgitation.  6. The aortic valve is tricuspid. There is mild thickening of the aortic valve. Aortic valve regurgitation is not  visualized. Aortic valve sclerosis is present, with no evidence of aortic valve stenosis.  7. The inferior vena cava is normal in size with greater than 50% respiratory variability, suggesting right atrial pressure of 3 mmHg. Comparison(s): The left ventricular function has improved. FINDINGS  Left Ventricle: Mildly decreased global longitudinal strain with some apical sparing, left ventricular mass calculated at 152.8g/m2. Left ventricular ejection fraction, by estimation, is 55 to 60%. Left ventricular ejection fraction by 3D volume is 55 %. The left ventricle has normal function. The left ventricle has no regional wall motion abnormalities. The average left ventricular global longitudinal strain is -13.9 %. The global longitudinal strain is abnormal. The left ventricular internal cavity size was normal in size. There is moderate concentric left ventricular hypertrophy. Abnormal (paradoxical) septal motion, consistent with left bundle branch block. Left ventricular diastolic parameters are consistent with Grade II diastolic dysfunction (pseudonormalization). Indeterminate filling pressures. Right Ventricle: The right ventricular size is normal. Moderately increased right ventricular wall thickness. Right ventricular systolic function is normal. There is normal pulmonary artery systolic pressure. The tricuspid regurgitant velocity is 2.55 m/s, and with an assumed right atrial pressure of 3 mmHg, the estimated right ventricular systolic pressure is 29.0 mmHg. Left Atrium: Left atrial size was severely dilated. Right Atrium: Right atrial size was normal in size. Pericardium: A small pericardial effusion is present. The pericardial effusion is circumferential. Mitral Valve: The mitral valve is normal in structure. Mild mitral annular calcification. Trivial mitral valve regurgitation. Tricuspid Valve: The tricuspid valve is normal in structure. Tricuspid valve regurgitation is mild. Aortic Valve: The aortic valve is  tricuspid. There is mild thickening of the aortic valve. Aortic valve regurgitation is not visualized. Aortic valve sclerosis is present, with no evidence of aortic valve stenosis. Pulmonic Valve: The pulmonic valve was normal in structure. Pulmonic valve regurgitation is trivial. Aorta: The aortic root and ascending aorta are structurally normal, with no evidence of dilitation. Venous: The inferior vena cava is normal in size with greater than 50% respiratory variability, suggesting right atrial pressure of 3 mmHg. IAS/Shunts: No atrial level shunt detected by color flow Doppler.  LEFT VENTRICLE PLAX 2D LVIDd:         3.70 cm         Diastology LVIDs:         2.50 cm         LV e' medial:    4.46 cm/s LV PW:         1.45 cm         LV E/e' medial:  11.7 LV IVS:        1.60 cm         LV e' lateral:   6.64 cm/s LVOT diam:     2.00 cm         LV E/e' lateral: 7.8 LV SV:         78 LV SV Index:   43              2D LVOT Area:     3.14 cm        Longitudinal                                Strain                                2D Strain GLS  -14.2 %                                (A2C):                                2D Strain GLS  -15.0 %                                (A3C):                                2D Strain GLS  -12.4 %                                (A4C):                                2D Strain GLS  -13.9 %                                Avg:                                 3D Volume EF                                LV 3D EF:    Left                                             ventricul                                             ar  ejection                                             fraction                                             by 3D                                             volume is                                             55 %.                                 3D Volume EF:                                3D EF:        55 %                                 LV EDV:       162 ml                                LV ESV:       74 ml                                LV SV:        88 ml RIGHT VENTRICLE RV S prime:     11.40 cm/s TAPSE (M-mode): 1.8 cm LEFT ATRIUM             Index        RIGHT ATRIUM           Index LA diam:        4.30 cm 2.39 cm/m   RA Area:     18.20 cm LA Vol (A2C):   78.6 ml 43.66 ml/m  RA Volume:   51.70 ml  28.72 ml/m LA Vol (A4C):   83.4 ml 46.32 ml/m LA Biplane Vol: 81.3 ml 45.16 ml/m  AORTIC VALVE LVOT Vmax:   122.00 cm/s LVOT Vmean:  94.400 cm/s LVOT VTI:    0.247 m  AORTA Ao Root diam: 3.30 cm Ao Asc diam:  3.50 cm MITRAL VALVE               TRICUSPID VALVE MV Area (PHT): 1.56 cm    TR Peak grad:   26.0 mmHg MV Decel Time: 485 msec    TR Vmax:        255.00 cm/s MV E velocity: 52.10 cm/s MV A velocity: 75.10 cm/s  SHUNTS MV E/A ratio:  0.69  Systemic VTI:  0.25 m                            Systemic Diam: 2.00 cm Jerel Balding MD Electronically signed by Jerel Balding MD Signature Date/Time: 08/11/2023/10:55:26 AM    Final    US  RENAL Result Date: 08/10/2023 CLINICAL DATA:  Acute renal insufficiency. EXAM: RENAL / URINARY TRACT ULTRASOUND COMPLETE COMPARISON:  Abdominal ultrasound dated 11/12/2018. FINDINGS: Right Kidney: Renal measurements: 11.3 x 6.8 x 5.4 cm = volume: 218 mL. Increased parenchymal echogenicity. No hydronephrosis or shadowing stone. Left Kidney: Renal measurements: 11.5 x 5.4 x 5.3 cm = volume: 172 mL. Increased parenchymal echogenicity. No hydronephrosis or shadowing stone. Bladder: The urinary bladder is collapsed and not well visualized. Other: None. IMPRESSION: Increased renal parenchymal echogenicity may represent medical renal disease. No hydronephrosis or shadowing stone. Electronically Signed   By: Vanetta Chou M.D.   On: 08/10/2023 22:17   PERIPHERAL VASCULAR CATHETERIZATION Result Date: 08/10/2023 Images from the original result were not included.   Patient name: Angle Karel   MRN:  [id number]        DOB: April 15, 1951          Sex: male  08/10/2023 Pre-operative Diagnosis: CL TI with bilateral foot wounds Post-operative diagnosis:  Same Surgeon:  Norman GORMAN Serve, MD Procedure Performed:  Ultrasound-guided access of left common femoral artery Second order cannulation of right external iliac Aortogram Bilateral lower extremity angiogram Mynx closure of left common femoral artery access 5 minutes moderate sedation   Indications: Mr. Dowty is a 73 year old male with multiple significant medical comorbidities who presented to the hospital with shortness of breath was found to have bilateral foot wounds.  Noninvasive vascular labs were notable for normal ABIs bilaterally but significantly decreased toe pressures in the 60s.  Recent benefits of bilateral lower extremity angiogram with possible intervention were reviewed with the patient and the wife, they expressed understanding were willing to proceed.  Given his renal function CO2 angiography was planned.  Findings: Widely patent infrarenal aorta and bilateral iliac systems.  Right common femoral, profunda and SFA patent without significant stenosis.  Popliteal artery patent without significant stenosis.  The AT is patent proximally but occludes at its mid segment and is reconstituted distally at the ankle by peroneal collaterals.  The PT and peroneal are patent without significant stenosis throughout the course.  Left common femoral, profunda and SFA are patent without significant stenosis popliteal artery patent without significant stenosis.  There is three-vessel runoff without significant stenosis noted in the AT, PT or peroneal arteries.             Procedure:  The patient was identified in the holding area and taken to the cath lab  The patient was then placed supine on the table and prepped and draped in the usual sterile fashion.  A time out was called.  Ultrasound was used to evaluate the left common femoral artery.  It was patent .  A  digital ultrasound image was acquired.  A micropuncture needle was used to access the left common femoral artery under ultrasound guidance.  An 018 wire was advanced without resistance and a micropuncture sheath was placed.  The 018 wire was removed and a benson wire was placed.  The micropuncture sheath was exchanged for a 5 french sheath.  An omniflush catheter was advanced over the wire to the level of L-1.  During this time patient began to desat to  have respiratory distress.  The sedation was reversed with flumazenil , jaw thrust performed and a nonrebreather mask was placed.  During this time he did not have significant bradycardia down to 30 but recovered after a few minutes with a sat of 100 and a heart rate in the 70s which was his baseline.  At this time he was responding appropriately and denied any chest pain or shortness of breath.  I decided to proceed with angiography. An abdominal angiogram was obtained.  Next, using the omniflush catheter and a Bentson wire, the aortic bifurcation was crossed and the catheter was placed into theright external iliac artery and right runoff was obtained. This demonstrated the above findings. left runoff was performed via retrograde sheath injections which demonstrated the above findings.  Contrast: None, CO2 used Sedation: 5 minutes  Impression: Inline flow to the foot without flow-limiting stenosis bilaterally. Decreased toe pressures likely due to microvascular disease.  Remains high risk for amputation.   Norman GORMAN Serve MD [facility] Office: [phone]   CT HEAD WO CONTRAST ( ) Result Date: 08/08/2023 CLINICAL DATA:  Mental status change, unknown cause EXAM: CT HEAD WITHOUT CONTRAST TECHNIQUE: Contiguous axial images were obtained from the base of the skull through the vertex without intravenous contrast. RADIATION DOSE REDUCTION: This exam was performed according to the departmental dose-optimization program which includes  automated exposure control, adjustment of the mA and/or kV according to patient size and/or use of iterative reconstruction technique. COMPARISON:  CT Head 04/22/22 FINDINGS: Brain: No evidence of acute infarction, hemorrhage, hydrocephalus, extra-axial collection or mass lesion/mass effect.Background of mild chronic microvascular ischemic change. 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 Vascular: No hyperdense vessel or unexpected calcification. Unchanged calcification along the left A2 segment. Skull: Normal. Negative for fracture or focal lesion. Sinuses/Orbits: No acute finding.  Left lens replacement. Other: None. IMPRESSION: No acute intracranial abnormality. Electronically Signed   By: Lyndall Gore M.D.   On: 08/08/2023 17:33   MR FOOT LEFT WO CONTRAST Result Date: 08/08/2023 CLINICAL DATA:  Diabetic with chronic bilateral foot wounds. Left plantar hindfoot wound. Soft tissue infection suspected. Evaluate for osteomyelitis. EXAM: MRI OF THE LEFT FOOT WITHOUT CONTRAST TECHNIQUE: Multiplanar, multisequence MR imaging of the left hindfoot was performed. No intravenous contrast was administered. COMPARISON:  Radiographs 08/07/2023 and 04/21/2022. FINDINGS: Bones/Joint/Cartilage As demonstrated radiographically, there is soft tissue ulceration along the plantar aspect of the hindfoot with underlying heterogeneous edema and soft tissue emphysema throughout the subcutaneous fat. These soft tissue changes abut the inferolateral aspect of the calcaneal tuberosity and are associated with mild calcaneal marrow T2 hyperintensity. No definite cortical destruction on the T1 weighted images. No other suspicious or acute osseous findings identified within the hindfoot or midfoot. There are chronic subtalar and talonavicular degenerative changes and a mild pes planus deformity. No significant tibiotalar arthropathy or effusion. Ligaments The major ankle ligaments appear intact. Muscles and Tendons The ankle tendons  appear intact without significant tenosynovitis. No focal muscular abnormalities are identified. Mild generalized muscular T2 hyperintensity, likely related to underlying diabetes. Soft tissues As above, soft tissue ulceration along the plantar aspect of the hindfoot with underlying subcutaneous edema and soft tissue emphysema as correlated with the earlier radiographs. No focal fluid collection identified. There is mild nonspecific dorsal subcutaneous edema. IMPRESSION: 1. Soft tissue ulceration along the plantar aspect of the hindfoot with underlying subcutaneous edema and soft tissue emphysema, consistent with soft tissue infection. The soft tissue gas suggest possible necrotizing infection. Correlate clinically.  2. Adjacent mild calcaneal marrow T2 hyperintensity in the calcaneal tuberosity without cortical destruction. This finding is nonspecific and could be secondary to hyperemia or early osteomyelitis. 3. No focal fluid collection identified. 4. Chronic subtalar and talonavicular degenerative changes. Electronically Signed   By: Elsie Perone M.D.   On: 08/08/2023 08:08   MR FOOT RIGHT WO CONTRAST Result Date: 08/08/2023 CLINICAL DATA:  Chronic bilateral foot wounds. Clinical concern for osteomyelitis. Soft tissue ulcer along the medial aspect of the 1st MTP joint. Soft tissue infection suspected. EXAM: MRI OF THE RIGHT FOREFOOT WITHOUT CONTRAST TECHNIQUE: Multiplanar, multisequence MR imaging of the right forefoot was performed. No intravenous contrast was administered. COMPARISON:  Radiographs same date.  No other comparison studies. FINDINGS: Bones/Joint/Cartilage Mild soft tissue ulceration along the medial aspect of the 1st metatarsophalangeal joint. Underlying mild 1st metatarsal-phalangeal degenerative changes with joint space narrowing and osteophytes. No significant joint effusion or evidence of the underlying osteomyelitis. There is nonspecific marrow edema within the 2nd metatarsal head with  questionable underlying linear subchondral low signal, best seen on sagittal image 16/9. This could reflect Freiberg infraction. Mild proximal extension of marrow edema into the 2nd metatarsal shaft. No significant joint effusion or abnormality of the adjacent 2nd proximal phalangeal base. The additional toes and metatarsals are normal. Normal alignment at the Lisfranc joint Ligaments Intact Lisfranc ligament. The collateral ligaments of the metatarsophalangeal joints appear intact. Muscles and Tendons The forefoot tendons appear intact. Mild edema and a small amount of ill-defined fluid along the flexor digitorum tendons within the forefoot. Mild nonspecific muscular atrophy and edema without focal fluid collection. Soft tissues As above, mild soft tissue ulceration along the medial aspect of the 1st metatarsophalangeal joint. No underlying focal fluid collection. Nonspecific dorsal subcutaneous edema. In correlation with radiographs, underlying vascular calcifications typical of diabetes. IMPRESSION: 1. Mild soft tissue ulceration along the medial aspect of the 1st metatarsophalangeal joint without evidence of underlying osteomyelitis or abscess. 2. Nonspecific marrow edema within the 2nd metatarsal head with questionable underlying linear subchondral low signal. This could reflect Freiberg infraction or a stress fracture. 3. Mild degenerative changes of the 1st metatarsophalangeal joint. 4. Mild nonspecific edema and a small amount of ill-defined fluid along the flexor digitorum tendons and mild dorsal subcutaneous edema. No focal fluid collections. Electronically Signed   By: Elsie Perone M.D.   On: 08/08/2023 07:58   DG Foot 2 Views Right Result Date: 08/07/2023 CLINICAL DATA:  Chronic foot wounds, concern for osteo EXAM: RIGHT FOOT - 2 VIEW COMPARISON:  None Available. FINDINGS: Small soft tissue ulcer along the medial aspect of the 1st MTP joint. No associated adjacent cortical destruction to suggest  osteomyelitis. Mild soft tissue swelling/edema, most prominent along the dorsal forefoot. No fracture or dislocation is seen. Vascular calcifications. IMPRESSION: Small soft tissue ulcer along the medial aspect of the 1st MTP joint. No radiographic findings of osteomyelitis. Electronically Signed   By: Pinkie Pebbles M.D.   On: 08/07/2023 18:27   DG Foot 2 Views Left Result Date: 08/07/2023 CLINICAL DATA:  Chronic foot wounds, concern for osteo EXAM: LEFT FOOT - 2 VIEW COMPARISON:  None Available. FINDINGS: Soft tissue wound/ulcer along the plantar surface of the heel, adjacent to the calcaneus. Flattening of the talus with pes planus. Subtle cortical lucency medially along the anterior talus versus anterior process of the calcaneus on the frontal radiograph. While this could reflect osteomyelitis, it is not well visualized on the lateral view. Superimposed soft tissue gas related to the  large ulcer could also lead to this appearance. No fracture is seen. Mild to moderate dorsal soft tissue swelling. IMPRESSION: Soft tissue wound/ulcer along the plantar surface of the heel, adjacent to the calcaneus. Possible subtle cortical lucency along the anterior calcaneus versus talus, equivocal. Osteomyelitis is not excluded. Electronically Signed   By: Pinkie Pebbles M.D.   On: 08/07/2023 18:24   VAS US  ABI WITH/WO TBI Result Date: 08/07/2023  LOWER EXTREMITY DOPPLER STUDY Patient Name:  ALMALIK WEISSBERG  Date of Exam:   08/07/2023 Medical Rec #: [id number]          Accession #:    [id number] Date of Birth: Jan 19, 1951          Patient Gender: M Patient Age:   89 years Exam Location:  [facility] [place] Procedure:      VAS US  ABI WITH/WO TBI Referring Phys: LYNWOOD BUGLER --------------------------------------------------------------------------------  Indications: BLE DM foot infection/wounds High Risk Factors: Hypertension, hyperlipidemia, Diabetes, no history of                    smoking, coronary artery  disease. Other Factors: CHF,.  Limitations: Today's exam was limited due to venous interference and hyperemic              flow. Comparison Study: No previous exams Performing Technologist: Leigh Rom RVT/RDMS  Examination Guidelines: A complete evaluation includes at minimum, Doppler waveform signals and systolic blood pressure reading at the level of bilateral brachial, anterior tibial, and posterior tibial arteries, when vessel segments are accessible. Bilateral testing is considered an integral part of a complete examination. Photoelectric Plethysmograph (PPG) waveforms and toe systolic pressure readings are included as required and additional duplex testing as needed. Limited examinations for reoccurring indications may be performed as noted.  ABI Findings: +---------+------------------+-----+-------------------+--------+ Right    Rt Pressure (mmHg)IndexWaveform           Comment  +---------+------------------+-----+-------------------+--------+ Brachial 128                    biphasic                    +---------+------------------+-----+-------------------+--------+ PTA      137               1.07 audibly multiphasic         +---------+------------------+-----+-------------------+--------+ DP       125               0.98 audibly multiphasic         +---------+------------------+-----+-------------------+--------+ Great Toe67                0.52 Abnormal                    +---------+------------------+-----+-------------------+--------+ +---------+------------------+-----+-------------------+-------+ Left     Lt Pressure (mmHg)IndexWaveform           Comment +---------+------------------+-----+-------------------+-------+ Brachial 116                    triphasic                  +---------+------------------+-----+-------------------+-------+ PTA      135               1.05 audibly multiphasic         +---------+------------------+-----+-------------------+-------+ DP       126               0.98 audibly multiphasic        +---------+------------------+-----+-------------------+-------+  Great Toe51                0.40 Abnormal                   +---------+------------------+-----+-------------------+-------+ +-------+-----------+-----------+------------+------------+ ABI/TBIToday's ABIToday's TBIPrevious ABIPrevious TBI +-------+-----------+-----------+------------+------------+ Right  1.07       0.52                                +-------+-----------+-----------+------------+------------+ Left   1.05       0.40                                +-------+-----------+-----------+------------+------------+ Accurate doppler signals difficult to obtain due to venous interference and hyperemic flow from infection.  Summary: Right: Resting right ankle-brachial index is within normal range. The right toe-brachial index is abnormal. Left: Resting left ankle-brachial index is within normal range. The left toe-brachial index is abnormal. *See table(s) above for measurements and observations.  Electronically signed by Norman Serve on 08/07/2023 at 4:54:52 PM.    Final    CT Angio Chest PE W and/or Wo Contrast Result Date: 08/07/2023 CLINICAL DATA:  Chest pain. EXAM: CT ANGIOGRAPHY CHEST WITH CONTRAST TECHNIQUE: Multidetector CT imaging of the chest was performed using the standard protocol during bolus administration of intravenous contrast. Multiplanar CT image reconstructions and MIPs were obtained to evaluate the vascular anatomy. RADIATION DOSE REDUCTION: This exam was performed according to the departmental dose-optimization program which includes automated exposure control, adjustment of the mA and/or kV according to patient size and/or use of iterative reconstruction technique. CONTRAST:  60mL OMNIPAQUE  IOHEXOL  350 MG/ML SOLN COMPARISON:  July 19, 2021. FINDINGS: Cardiovascular:  Satisfactory opacification of the pulmonary arteries to the segmental level. No evidence of pulmonary embolism. Normal heart size. No pericardial effusion. Coronary artery calcifications are noted. Mediastinum/Nodes: No enlarged mediastinal, hilar, or axillary lymph nodes. Thyroid  gland, trachea, and esophagus demonstrate no significant findings. Lungs/Pleura: Lungs are clear. No pleural effusion or pneumothorax. Upper Abdomen: No acute abnormality. Musculoskeletal: No chest wall abnormality. No acute or significant osseous findings. Review of the MIP images confirms the above findings. IMPRESSION: No definite evidence of pulmonary embolus. Coronary artery calcifications are noted suggesting coronary artery disease. Aortic Atherosclerosis (ICD10-I70.0). Electronically Signed   By: Lynwood Landy Raddle M.D.   On: 08/07/2023 15:49   DG Chest Port 1 View Result Date: 08/07/2023 CLINICAL DATA:  Chest pain. EXAM: PORTABLE CHEST 1 VIEW COMPARISON:  07/09/2020. FINDINGS: Bilateral lung fields are clear. Note is made of elevated right hemidiaphragm. Bilateral costophrenic angles are clear. Normal cardio-mediastinal silhouette. No acute osseous abnormalities. The soft tissues are within normal limits. IMPRESSION: No active disease. Electronically Signed   By: Ree Molt M.D.   On: 08/07/2023 12:21     Subjective: - no chest pain, shortness of breath, no abdominal pain, nausea or vomiting.   Discharge Exam: BP (!) 145/78   Pulse 61   Temp 98.1 F (36.7 C) (Oral)   Resp 18   Ht 5' 9 (1.753 m)   Wt 68 kg   SpO2 100%   BMI 22.15 kg/m   General: Pt is alert, awake, not in acute distress Cardiovascular: RRR, S1/S2 +, no rubs, no gallops Respiratory: CTA bilaterally, no wheezing, no rhonchi Abdominal: Soft, NT, ND, bowel sounds + Extremities: no edema, no cyanosis    The results of significant diagnostics from  this hospitalization (including imaging, microbiology, ancillary and laboratory) are listed  below for reference.     Microbiology: No results found for this or any previous visit (from the past 240 hours).   Labs: Basic Metabolic Panel: Recent Labs  Lab 08/28/23 0605 08/30/23 0445 09/01/23 0503 09/02/23 0755  NA  --  140 138 138  K  --  3.1* 3.6 3.7  CL  --  101 100 102  CO2  --  30 30 29   GLUCOSE  --  119* 137* 129*  BUN  --  35* 39* 37*  CREATININE 1.08 1.00 0.98 0.93  CALCIUM   --  8.2* 8.0* 8.2*  MG  --   --  1.6* 1.7   Liver Function Tests: Recent Labs  Lab 09/01/23 0503 09/02/23 0755  AST 13* 14*  ALT 15 9  ALKPHOS 52 55  BILITOT 0.4 0.7  PROT 6.0* 6.4*  ALBUMIN  1.8* 2.0*   CBC: Recent Labs  Lab 09/01/23 0503 09/02/23 0755  WBC 4.3 4.0  HGB 9.2* 9.8*  HCT 28.4* 30.7*  MCV 93.4 93.6  PLT 218 232   CBG: Recent Labs  Lab 09/01/23 1633 09/01/23 2102 09/02/23 1638 09/02/23 2138 09/03/23 0618  GLUCAP 190* 192* 192* 241* 179*   Hgb A1c No results for input(s): HGBA1C in the last 72 hours. Lipid Profile No results for input(s): CHOL, HDL, LDLCALC, TRIG, CHOLHDL, LDLDIRECT in the last 72 hours. Thyroid  function studies No results for input(s): TSH, T4TOTAL, T3FREE, THYROIDAB in the last 72 hours.  Invalid input(s): FREET3 Urinalysis    Component Value Date/Time   COLORURINE YELLOW 08/07/2023 2052   APPEARANCEUR HAZY (A) 08/07/2023 2052   LABSPEC 1.026 08/07/2023 2052   PHURINE 5.0 08/07/2023 2052   GLUCOSEU >=500 (A) 08/07/2023 2052   HGBUR SMALL (A) 08/07/2023 2052   BILIRUBINUR NEGATIVE 08/07/2023 2052   KETONESUR NEGATIVE 08/07/2023 2052   PROTEINUR 30 (A) 08/07/2023 2052   NITRITE NEGATIVE 08/07/2023 2052   LEUKOCYTESUR NEGATIVE 08/07/2023 2052    FURTHER DISCHARGE INSTRUCTIONS:   Get Medicines reviewed and adjusted: Please take all your medications with you for your next visit with your Primary MD   Laboratory/radiological data: Please request your Primary MD to go over all hospital tests and  procedure/radiological results at the follow up, please ask your Primary MD to get all Hospital records sent to his/her office.   In some cases, they will be blood work, cultures and biopsy results pending at the time of your discharge. Please request that your primary care M.D. goes through all the records of your hospital data and follows up on these results.   Also Note the following: If you experience worsening of your admission symptoms, develop shortness of breath, life threatening emergency, suicidal or homicidal thoughts you must seek medical attention immediately by calling 911 or calling your MD immediately  if symptoms less severe.   You must read complete instructions/literature along with all the possible adverse reactions/side effects for all the Medicines you take and that have been prescribed to you. Take any new Medicines after you have completely understood and accpet all the possible adverse reactions/side effects.    Do not drive when taking Pain medications or sleeping medications (Benzodaizepines)   Do not take more than prescribed Pain, Sleep and Anxiety Medications. It is not advisable to combine anxiety,sleep and pain medications without talking with your primary care practitioner   Special Instructions: If you have smoked or chewed Tobacco  in the last 2  yrs please stop smoking, stop any regular Alcohol  and or any Recreational drug use.   Wear Seat belts while driving.   Please note: You were cared for by a hospitalist during your hospital stay. Once you are discharged, your primary care physician will handle any further medical issues. Please note that NO REFILLS for any discharge medications will be authorized once you are discharged, as it is imperative that you return to your primary care physician (or establish a relationship with a primary care physician if you do not have one) for your post hospital discharge needs so that they can reassess your need for  medications and monitor your lab values.  Time coordinating discharge: 40 minutes  SIGNED:  Nilda Fendt, MD, PhD 09/03/2023, 10:17 AM

## 2023-09-03 NOTE — Plan of Care (Signed)
  Problem: Coping: Goal: Ability to adjust to condition or change in health will improve Outcome: Progressing   Problem: Fluid Volume: Goal: Ability to maintain a balanced intake and output will improve Outcome: Progressing   Problem: Education: Goal: Knowledge of General Education information will improve Description: Including pain rating scale, medication(s)/side effects and non-pharmacologic comfort measures Outcome: Progressing   Problem: Health Behavior/Discharge Planning: Goal: Ability to manage health-related needs will improve Outcome: Progressing

## 2023-09-03 NOTE — Progress Notes (Signed)
 BP elevated 171/82. MD notified. Orders received.

## 2023-09-03 NOTE — Progress Notes (Signed)
 Pt is in a calm stable condition. Discharged to rehab. All IVs and monitors have been removed.Discharge instructions have been given.

## 2023-09-09 ENCOUNTER — Telehealth: Payer: Self-pay | Admitting: Orthopedic Surgery

## 2023-09-09 NOTE — Telephone Encounter (Signed)
 Called [facility] to schedule follow up with Erin. Left message to give [place] a call back to schedule. [phone]

## 2023-09-09 NOTE — Telephone Encounter (Signed)
 Thank you. Please hold message and try x 3 to make contact the next few days . The pt d/c from the hospital on 09/03/2023 and is bilateral BKA

## 2023-09-09 NOTE — Telephone Encounter (Signed)
**  [facility]

## 2023-09-15 ENCOUNTER — Telehealth: Payer: Self-pay

## 2023-09-15 NOTE — Telephone Encounter (Signed)
 I called [facility] and was on hold for over 50 min. I was transferred several times and then the line was disconnected. I called back and waited another 10 minutes and was able to make appt through medical records for this pt. She was very helpful. I did ask to speak with DOn or ADON and was transferred to [facility] I explained the difficulty in getting this pt scheduled and that this was unacceptable. The pt as part of his d/c orders was to have an appt in one week. It is now 12 days since they have accepted the pt and no one has called for an appt nor returned the multiple phone calls we have made to schedule. I asked if there was a direct number that I could call to reach her at the time of the pt's appt should there be an issue and she advised to call the main number and someone would help me. She advised that no one in the facility had an extension and I verified this and repeated this back to her to confirm no extensions anywhere in the building.

## 2023-09-15 NOTE — Telephone Encounter (Signed)
-----   Message from RMA Inioluwa Boulay F sent at 09/14/2023  3:18 PM EST ----- Regarding: RE: monitor I called facility and confirmed that this pt is still a resident. He had surgery a month ago and was d/c 09/03/2023 we have been calling to sch appt for follow up which d/c paperwork advised to f/u in one week still is not scheduled for appt and unable to reach scheduling. Phone rings unable to leave a message.called back and advised front desk and was on hold > 10 min and had to disconnect the call. Will discuss with management to call DON ----- Message ----- From: Frances Lionel CROME, RMA Sent: 09/04/2023   9:01 AM EST To: Lionel CROME Frances, RMA Subject: RE: monitor                                    Pt d/c to [facility] health message sent to the front desk to call and make an appt for f/u ----- Message ----- From: Frances Lionel CROME, RMA Sent: 08/18/2023   2:45 PM EST To: Lionel CROME Frances, RMA Subject: RE: monitor                                    Pending SNF placement ----- Message ----- From: Frances Lionel CROME, RMA Sent: 08/14/2023   4:27 PM EST To: Lionel CROME Frances, RMA Subject: monitor                                        08/14/2023 bilateral BKA 38 micro each side. Monitor for d/c

## 2023-09-22 ENCOUNTER — Encounter: Payer: 59 | Admitting: Orthopedic Surgery

## 2023-09-23 ENCOUNTER — Encounter: Payer: 59 | Admitting: Family

## 2023-09-25 ENCOUNTER — Ambulatory Visit (INDEPENDENT_AMBULATORY_CARE_PROVIDER_SITE_OTHER): Payer: 59 | Admitting: Family

## 2023-09-25 ENCOUNTER — Encounter: Payer: 59 | Admitting: Family

## 2023-09-25 ENCOUNTER — Encounter: Payer: Self-pay | Admitting: Family

## 2023-09-25 DIAGNOSIS — Z89512 Acquired absence of left leg below knee: Secondary | ICD-10-CM

## 2023-09-25 DIAGNOSIS — Z89511 Acquired absence of right leg below knee: Secondary | ICD-10-CM

## 2023-09-29 ENCOUNTER — Telehealth: Payer: Self-pay

## 2023-09-29 NOTE — Telephone Encounter (Signed)
Earl Calderon from [facility] asking for a Rx emailed to her if possible for "Prosthetic Eval"  [email or web address]

## 2023-09-29 NOTE — Progress Notes (Signed)
Post-Op Visit Note   Patient: Earl Calderon           Date of Birth: 08/25/1951           MRN: [id number] Visit Date: 09/25/2023 PCP: Melvenia Beam, MD  Chief Complaint:  Chief Complaint  Patient presents with   Right Leg - Routine Post Op    08/14/2023 bilateral BKA   Left Leg - Routine Post Op    HPI:  HPI The patient is a 73 year old gentleman who is seen status post bilateral below-knee amputations December 13.  This is his first postop appointment.  Patient is a new bilateral transtibial  amputee.  Patient's current comorbidities are not expected to impact the ability to function with the prescribed prosthesis. Patient verbally communicates a strong desire to use a prosthesis. Patient currently requires mobility aids to ambulate without a prosthesis.  Expects not to use mobility aids with a new prosthesis.  Patient is a K3 level ambulator that spends a lot of time walking around on uneven terrain over obstacles, up and down stairs, and ambulates with a variable cadence.    Ortho Exam On examination bilateral residual limb staples are in place these are well-healed there is no gaping or drainage these have consolidated as well  Visit Diagnoses: No diagnosis found.  Plan: Proceed with prosthesis set up shrinker's around-the-clock staples harvested today.  Patient tolerated well.  He will follow-up in the office in 4 weeks.  Given an order for his prosthesis set up.  Follow-Up Instructions: No follow-ups on file.   Imaging: No results found.  Orders:  No orders of the defined types were placed in this encounter.  No orders of the defined types were placed in this encounter.    PMFS History: Patient Active Problem List   Diagnosis Date Noted   Delirium 08/18/2023   Acute postoperative anemia due to expected blood loss 08/17/2023   S/P bilateral BKA (below knee amputation) (HCC) - on 08/14/2023 08/14/2023   Diabetes mellitus with foot ulcer and  gangrene (HCC) 08/12/2023   Ulcer of both feet, limited to breakdown of skin (HCC) 08/12/2023   Acute respiratory failure with hypoxia (HCC) 08/08/2023   Gangrene of both feet (HCC) 08/07/2023   Acute renal failure superimposed on stage 3a chronic kidney disease (HCC) 08/07/2023   Hypoxia 08/07/2023   Pure hypercholesterolemia 08/05/2021   MGUS (monoclonal gammopathy of unknown significance) 07/01/2021   Lymphadenopathy, cervical 07/01/2021   Goals of care, counseling/discussion 07/01/2021   CAD in native artery 01/01/2021   NSVT (nonsustained ventricular tachycardia) (HCC) 08/24/2020   Dyspnea    Acute on chronic combined systolic (congestive) and diastolic (congestive) heart failure (HCC) 07/11/2020   Status post coronary artery stent placement    LBBB (left bundle branch block) 11/13/2018   Chronic combined systolic and diastolic heart failure (HCC) 11/11/2018   Essential hypertension 11/11/2018   Type 2 diabetes mellitus with chronic kidney disease, with long-term current use of insulin (HCC) 11/11/2018   Hypomagnesemia 11/11/2018   Hyperbilirubinemia 11/11/2018   Abnormal LFTs 11/11/2018   Past Medical History:  Diagnosis Date   CAD in native artery 01/01/2021   Prior LAD PCI.  Currently no symptoms of ischemia.  Encouraged him to do some light exercising.  Continue aspirin, carvedilol, and rosuvastatin.   Cataract    CKD (chronic kidney disease), stage III (HCC) 08/24/2020   Coronary artery disease    Diabetes mellitus without complication (HCC)    on meds   Diabetic  retinopathy (HCC)    Goals of care, counseling/discussion 07/01/2021   Hernia, inguinal    Hypertension    on meds   Hypertensive retinopathy    Lymphadenopathy, cervical 07/01/2021   MGUS (monoclonal gammopathy of unknown significance) 07/01/2021   NSVT (nonsustained ventricular tachycardia) (HCC) 08/24/2020   Pure hypercholesterolemia 08/05/2021   Resistant hypertension 11/11/2018   Uncontrolled type 2  diabetes mellitus 11/11/2018    Family History  Problem Relation Age of Onset   Hypertension Mother    Diabetes Mellitus II Mother    Arrhythmia Mother    Heart disease Mother    Hypertension Sister    Hypertension Brother    Heart disease Brother    Kidney disease Brother    Hypertension Other    Diabetes Mellitus II Other        All 9 sibblings have HTN   Colon cancer Neg Hx    Colon polyps Neg Hx    Esophageal cancer Neg Hx    Stomach cancer Neg Hx    Rectal cancer Neg Hx     Past Surgical History:  Procedure Laterality Date   ABDOMINAL AORTOGRAM W/LOWER EXTREMITY N/A 08/10/2023   Procedure: ABDOMINAL AORTOGRAM W/LOWER EXTREMITY;  Surgeon: Daria Pastures, MD;  Location: [facility] INVASIVE CV LAB;  Service: Cardiovascular;  Laterality: N/A;   AMPUTATION Bilateral 08/14/2023   Procedure: BILATERAL BELOW KNEE AMPUTATION;  Surgeon: Nadara Mustard, MD;  Location: [facility] OR;  Service: Orthopedics;  Laterality: Bilateral;   CORONARY STENT INTERVENTION N/A 11/15/2018   Procedure: CORONARY STENT INTERVENTION;  Surgeon: Corky Crafts, MD;  Location: [facility] INVASIVE CV LAB;  Service: Cardiovascular;  Laterality: N/A;   HEMORROIDECTOMY     RIGHT HEART CATH N/A 07/18/2020   Procedure: RIGHT HEART CATH;  Surgeon: Iran Ouch, MD;  Location: [facility] INVASIVE CV LAB;  Service: Cardiovascular;  Laterality: N/A;   RIGHT/LEFT HEART CATH AND CORONARY ANGIOGRAPHY N/A 11/15/2018   Procedure: RIGHT/LEFT HEART CATH AND CORONARY ANGIOGRAPHY;  Surgeon: Corky Crafts, MD;  Location: [facility] INVASIVE CV LAB;  Service: Cardiovascular;  Laterality: N/A;   WISDOM TOOTH EXTRACTION     Social History   Occupational History   Not on file  Tobacco Use   Smoking status: Never   Smokeless tobacco: Never  Vaping Use   Vaping status: Never Used  Substance and Sexual Activity   Alcohol use: Not Currently   Drug use: Never   Sexual activity: Not Currently

## 2023-09-29 NOTE — Telephone Encounter (Signed)
Emailed order to address below.

## 2023-10-29 ENCOUNTER — Telehealth: Payer: Self-pay

## 2023-10-29 NOTE — Telephone Encounter (Signed)
 FYI  [employer] called concerning some paperwork that needs to be signed by Dr. Lajoyce Corners.

## 2023-10-29 NOTE — Telephone Encounter (Signed)
 Noted. We will look out for this.

## 2023-12-04 ENCOUNTER — Other Ambulatory Visit: Payer: Self-pay | Admitting: Cardiovascular Disease

## 2023-12-08 ENCOUNTER — Ambulatory Visit (HOSPITAL_BASED_OUTPATIENT_CLINIC_OR_DEPARTMENT_OTHER): Payer: 59 | Admitting: Family

## 2024-03-19 NOTE — ED Notes (Signed)
 Wife has been updated at this time via phone.   Sindy KANDICE Gunning, RN 03/19/24 [phone]

## 2024-07-04 ENCOUNTER — Encounter: Payer: Self-pay | Admitting: Radiology

## 2024-07-12 ENCOUNTER — Other Ambulatory Visit: Payer: Self-pay | Admitting: Cardiovascular Disease

## 2024-08-05 NOTE — Telephone Encounter (Signed)
°  Caller and Phone #: Earl Calderon, Earl Calderon [phone]   Main symptom and duration:   Reason for call: Patient wife states that she received a call regarding the patient device. Per the wife they currently do not have any electricity she is working on getting the power restored.   Can our reply be sent to your [facility] portal?

## 2024-08-05 NOTE — Telephone Encounter (Signed)
 Noted

## 2024-08-11 NOTE — Telephone Encounter (Signed)
 Called patient's wife, left vm to call back and r/s appt on 08/18/24 due to therapist being out of office and r/s remaining appts due to husband stating he will have transportation issues. Asked to return call to [phone]

## 2024-09-14 ENCOUNTER — Other Ambulatory Visit: Payer: Self-pay | Admitting: Cardiovascular Disease

## 2024-09-15 MED ORDER — EMPAGLIFLOZIN 10 MG PO TABS: 10.0000 mg | ORAL_TABLET | Freq: Every day | ORAL | 0 refills | Status: AC

## 2024-09-15 MED ORDER — SPIRONOLACTONE 25 MG PO TABS: 25.0000 mg | ORAL_TABLET | Freq: Every day | ORAL | 0 refills | Status: AC

## 2024-09-15 NOTE — Telephone Encounter (Signed)
 CMP done on 09/12/24

## 2024-09-15 NOTE — Telephone Encounter (Signed)
 Reviewed chart, below from May 2025 initial consult   Last winter, he was apparently hospitalized at [facility] in apparently developed significant gangrene of both lower extremities and underwent bilateral AKA's. He has become displeased with the [facility] and now is seeking alternative medical care.   Called pharmacy and advised to reach out to current cardiologist for refills

## 2024-09-15 NOTE — Telephone Encounter (Signed)
 In accordance with refill protocols, please review and address the following requirements before this medication refill can be authorized:  Appointment  and Labs  pt sees another Development Worker, [facility] at [facility]. Please advise
